# Patient Record
Sex: Male | Born: 1950 | Race: White | Hispanic: No | Smoking: Never smoker
Health system: Southern US, Community
[De-identification: ages and names within clinical notes are randomized; demographics above are authoritative.]

## PROBLEM LIST (undated history)

## (undated) DIAGNOSIS — N189 Chronic kidney disease, unspecified: Secondary | ICD-10-CM

## (undated) DIAGNOSIS — E785 Hyperlipidemia, unspecified: Secondary | ICD-10-CM

## (undated) DIAGNOSIS — H269 Unspecified cataract: Secondary | ICD-10-CM

## (undated) DIAGNOSIS — J8410 Pulmonary fibrosis, unspecified: Secondary | ICD-10-CM

## (undated) DIAGNOSIS — K635 Polyp of colon: Secondary | ICD-10-CM

## (undated) DIAGNOSIS — G4730 Sleep apnea, unspecified: Secondary | ICD-10-CM

## (undated) DIAGNOSIS — I10 Essential (primary) hypertension: Secondary | ICD-10-CM

## (undated) DIAGNOSIS — Z87442 Personal history of urinary calculi: Secondary | ICD-10-CM

## (undated) DIAGNOSIS — I252 Old myocardial infarction: Secondary | ICD-10-CM

## (undated) DIAGNOSIS — I509 Heart failure, unspecified: Secondary | ICD-10-CM

## (undated) DIAGNOSIS — E669 Obesity, unspecified: Secondary | ICD-10-CM

## (undated) DIAGNOSIS — F4321 Adjustment disorder with depressed mood: Secondary | ICD-10-CM

## (undated) DIAGNOSIS — K7460 Unspecified cirrhosis of liver: Secondary | ICD-10-CM

## (undated) DIAGNOSIS — I2510 Atherosclerotic heart disease of native coronary artery without angina pectoris: Secondary | ICD-10-CM

## (undated) HISTORY — DX: Heart failure, unspecified: I50.9

## (undated) HISTORY — DX: Atherosclerotic heart disease of native coronary artery without angina pectoris: I25.10

## (undated) HISTORY — DX: Obesity, unspecified: E66.9

## (undated) HISTORY — DX: Polyp of colon: K63.5

## (undated) HISTORY — PX: POLYPECTOMY: SHX149

## (undated) HISTORY — DX: Old myocardial infarction: I25.2

## (undated) HISTORY — DX: Pulmonary fibrosis, unspecified: J84.10

## (undated) HISTORY — DX: Adjustment disorder with depressed mood: F43.21

## (undated) HISTORY — DX: Personal history of urinary calculi: Z87.442

## (undated) HISTORY — PX: COLONOSCOPY: SHX174

## (undated) HISTORY — DX: Chronic kidney disease, unspecified: N18.9

## (undated) HISTORY — DX: Essential (primary) hypertension: I10

## (undated) HISTORY — DX: Unspecified cataract: H26.9

## (undated) HISTORY — DX: Unspecified cirrhosis of liver: K74.60

## (undated) HISTORY — DX: Hyperlipidemia, unspecified: E78.5

---

## 2004-02-02 DIAGNOSIS — I2510 Atherosclerotic heart disease of native coronary artery without angina pectoris: Secondary | ICD-10-CM

## 2004-02-02 HISTORY — DX: Atherosclerotic heart disease of native coronary artery without angina pectoris: I25.10

## 2004-02-02 HISTORY — PX: CORONARY ARTERY BYPASS GRAFT: SHX141

## 2004-07-07 DIAGNOSIS — I252 Old myocardial infarction: Secondary | ICD-10-CM

## 2004-07-07 HISTORY — DX: Old myocardial infarction: I25.2

## 2004-07-23 ENCOUNTER — Ambulatory Visit: Payer: Self-pay | Admitting: Cardiology

## 2004-07-24 ENCOUNTER — Inpatient Hospital Stay (HOSPITAL_COMMUNITY): Admission: AD | Admit: 2004-07-24 | Discharge: 2004-08-01 | Payer: Self-pay | Admitting: Cardiology

## 2004-07-24 ENCOUNTER — Encounter: Payer: Self-pay | Admitting: Cardiology

## 2004-07-24 ENCOUNTER — Ambulatory Visit: Payer: Self-pay | Admitting: Cardiology

## 2004-08-20 ENCOUNTER — Ambulatory Visit: Payer: Self-pay | Admitting: Cardiology

## 2004-09-24 ENCOUNTER — Ambulatory Visit: Payer: Self-pay | Admitting: Internal Medicine

## 2004-09-29 ENCOUNTER — Ambulatory Visit: Payer: Self-pay | Admitting: Cardiology

## 2004-10-01 ENCOUNTER — Ambulatory Visit: Payer: Self-pay | Admitting: Internal Medicine

## 2004-12-28 ENCOUNTER — Ambulatory Visit: Payer: Self-pay | Admitting: Cardiology

## 2005-02-23 ENCOUNTER — Ambulatory Visit: Payer: Self-pay | Admitting: Cardiology

## 2005-02-26 ENCOUNTER — Ambulatory Visit: Payer: Self-pay | Admitting: Cardiology

## 2006-07-03 ENCOUNTER — Encounter: Payer: Self-pay | Admitting: Internal Medicine

## 2006-07-06 ENCOUNTER — Ambulatory Visit: Payer: Self-pay | Admitting: Internal Medicine

## 2006-07-06 LAB — CONVERTED CEMR LAB
ALT: 27 units/L (ref 0–40)
AST: 31 units/L (ref 0–37)
Albumin: 3.8 g/dL (ref 3.5–5.2)
Alkaline Phosphatase: 59 units/L (ref 39–117)
BUN: 17 mg/dL (ref 6–23)
Basophils Absolute: 0 10*3/uL (ref 0.0–0.1)
Basophils Relative: 0.2 % (ref 0.0–1.0)
Bilirubin, Direct: 0.1 mg/dL (ref 0.0–0.3)
CO2: 26 meq/L (ref 19–32)
Calcium: 9.2 mg/dL (ref 8.4–10.5)
Chloride: 103 meq/L (ref 96–112)
Cholesterol: 103 mg/dL (ref 0–200)
Creatinine, Ser: 1.1 mg/dL (ref 0.4–1.5)
Eosinophils Absolute: 0.2 10*3/uL (ref 0.0–0.6)
Eosinophils Relative: 1.9 % (ref 0.0–5.0)
GFR calc Af Amer: 89 mL/min
GFR calc non Af Amer: 74 mL/min
Glucose, Bld: 103 mg/dL — ABNORMAL HIGH (ref 70–99)
HCT: 43.8 % (ref 39.0–52.0)
HDL: 27.2 mg/dL — ABNORMAL LOW (ref >39.0)
Hemoglobin: 15.6 g/dL (ref 13.0–17.0)
LDL Cholesterol: 58 mg/dL (ref 0–99)
Lymphocytes Relative: 28.1 % (ref 12.0–46.0)
MCHC: 35.7 g/dL (ref 30.0–36.0)
MCV: 89.4 fL (ref 78.0–100.0)
Monocytes Absolute: 0.6 10*3/uL (ref 0.2–0.7)
Monocytes Relative: 7 % (ref 3.0–11.0)
Neutro Abs: 5.2 10*3/uL (ref 1.4–7.7)
Neutrophils Relative %: 62.8 % (ref 43.0–77.0)
PSA: 0.37 ng/mL (ref 0.10–4.00)
Platelets: 398 10*3/uL (ref 150–400)
Potassium: 4.2 meq/L (ref 3.5–5.1)
RBC: 4.9 M/uL (ref 4.22–5.81)
RDW: 12.7 % (ref 11.5–14.6)
Sodium: 138 meq/L (ref 135–145)
TSH: 1.63 microintl units/mL (ref 0.35–5.50)
Total Bilirubin: 0.9 mg/dL (ref 0.3–1.2)
Total CHOL/HDL Ratio: 3.8
Total Protein: 7.5 g/dL (ref 6.0–8.3)
Triglycerides: 88 mg/dL (ref 0–149)
VLDL: 18 mg/dL (ref 0–40)
WBC: 8.4 10*3/uL (ref 4.5–10.5)

## 2006-08-09 ENCOUNTER — Encounter: Payer: Self-pay | Admitting: Internal Medicine

## 2006-08-09 DIAGNOSIS — I10 Essential (primary) hypertension: Secondary | ICD-10-CM

## 2006-08-09 DIAGNOSIS — Z87442 Personal history of urinary calculi: Secondary | ICD-10-CM

## 2006-08-09 DIAGNOSIS — E785 Hyperlipidemia, unspecified: Secondary | ICD-10-CM

## 2006-08-09 DIAGNOSIS — I252 Old myocardial infarction: Secondary | ICD-10-CM | POA: Insufficient documentation

## 2006-08-09 DIAGNOSIS — I2510 Atherosclerotic heart disease of native coronary artery without angina pectoris: Secondary | ICD-10-CM | POA: Insufficient documentation

## 2006-08-09 HISTORY — DX: Hyperlipidemia, unspecified: E78.5

## 2006-08-09 HISTORY — DX: Essential (primary) hypertension: I10

## 2006-08-09 HISTORY — DX: Personal history of urinary calculi: Z87.442

## 2006-09-09 ENCOUNTER — Encounter (INDEPENDENT_AMBULATORY_CARE_PROVIDER_SITE_OTHER): Payer: Self-pay

## 2006-10-10 ENCOUNTER — Ambulatory Visit: Payer: Self-pay | Admitting: Cardiology

## 2006-10-14 ENCOUNTER — Ambulatory Visit: Payer: Self-pay | Admitting: Internal Medicine

## 2006-12-06 ENCOUNTER — Ambulatory Visit: Payer: Self-pay | Admitting: Internal Medicine

## 2006-12-06 DIAGNOSIS — J069 Acute upper respiratory infection, unspecified: Secondary | ICD-10-CM | POA: Insufficient documentation

## 2006-12-09 ENCOUNTER — Telehealth: Payer: Self-pay | Admitting: Internal Medicine

## 2007-07-04 ENCOUNTER — Ambulatory Visit: Payer: Self-pay | Admitting: Internal Medicine

## 2007-07-04 DIAGNOSIS — L03119 Cellulitis of unspecified part of limb: Secondary | ICD-10-CM

## 2007-07-04 DIAGNOSIS — L02419 Cutaneous abscess of limb, unspecified: Secondary | ICD-10-CM | POA: Insufficient documentation

## 2007-09-07 ENCOUNTER — Telehealth: Payer: Self-pay | Admitting: Internal Medicine

## 2007-09-07 ENCOUNTER — Ambulatory Visit: Payer: Self-pay | Admitting: Internal Medicine

## 2007-09-07 DIAGNOSIS — F4321 Adjustment disorder with depressed mood: Secondary | ICD-10-CM | POA: Insufficient documentation

## 2007-09-07 HISTORY — DX: Adjustment disorder with depressed mood: F43.21

## 2007-09-15 ENCOUNTER — Telehealth: Payer: Self-pay | Admitting: Family Medicine

## 2007-10-16 ENCOUNTER — Ambulatory Visit: Payer: Self-pay | Admitting: Internal Medicine

## 2007-12-26 ENCOUNTER — Encounter: Payer: Self-pay | Admitting: Internal Medicine

## 2008-07-08 ENCOUNTER — Ambulatory Visit: Payer: Self-pay | Admitting: Internal Medicine

## 2008-07-08 LAB — CONVERTED CEMR LAB
ALT: 44 units/L (ref 0–53)
AST: 42 units/L — ABNORMAL HIGH (ref 0–37)
Albumin: 3.8 g/dL (ref 3.5–5.2)
Alkaline Phosphatase: 70 units/L (ref 39–117)
BUN: 10 mg/dL (ref 6–23)
Basophils Absolute: 0 10*3/uL (ref 0.0–0.1)
Basophils Relative: 0.3 % (ref 0.0–3.0)
Bilirubin Urine: NEGATIVE
Bilirubin, Direct: 0 mg/dL (ref 0.0–0.3)
CO2: 29 meq/L (ref 19–32)
Calcium: 9.1 mg/dL (ref 8.4–10.5)
Chloride: 108 meq/L (ref 96–112)
Cholesterol: 150 mg/dL (ref 0–200)
Creatinine, Ser: 0.8 mg/dL (ref 0.4–1.5)
Eosinophils Absolute: 0.2 10*3/uL (ref 0.0–0.7)
Eosinophils Relative: 2 % (ref 0.0–5.0)
GFR calc non Af Amer: 105.26 mL/min (ref >60)
Glucose, Bld: 101 mg/dL — ABNORMAL HIGH (ref 70–99)
Glucose, Urine, Semiquant: NEGATIVE
HCT: 43.6 % (ref 39.0–52.0)
HDL: 33.8 mg/dL — ABNORMAL LOW (ref >39.00)
Hemoglobin: 15.3 g/dL (ref 13.0–17.0)
Ketones, urine, test strip: NEGATIVE
LDL Cholesterol: 92 mg/dL (ref 0–99)
Lymphocytes Relative: 25.2 % (ref 12.0–46.0)
Lymphs Abs: 2.2 10*3/uL (ref 0.7–4.0)
MCHC: 35.2 g/dL (ref 30.0–36.0)
MCV: 91.2 fL (ref 78.0–100.0)
Monocytes Absolute: 0.5 10*3/uL (ref 0.1–1.0)
Monocytes Relative: 6.2 % (ref 3.0–12.0)
Neutro Abs: 5.7 10*3/uL (ref 1.4–7.7)
Neutrophils Relative %: 66.3 % (ref 43.0–77.0)
Nitrite: NEGATIVE
PSA: 0.29 ng/mL (ref 0.10–4.00)
Platelets: 260 10*3/uL (ref 150.0–400.0)
Potassium: 4.2 meq/L (ref 3.5–5.1)
Protein, U semiquant: NEGATIVE
RBC: 4.77 M/uL (ref 4.22–5.81)
RDW: 13.2 % (ref 11.5–14.6)
Sodium: 141 meq/L (ref 135–145)
Specific Gravity, Urine: 1.025
TSH: 2.41 microintl units/mL (ref 0.35–5.50)
Total Bilirubin: 1 mg/dL (ref 0.3–1.2)
Total CHOL/HDL Ratio: 4
Total Protein: 7.3 g/dL (ref 6.0–8.3)
Triglycerides: 119 mg/dL (ref 0.0–149.0)
Urobilinogen, UA: 0.2
VLDL: 23.8 mg/dL (ref 0.0–40.0)
WBC Urine, dipstick: NEGATIVE
WBC: 8.6 10*3/uL (ref 4.5–10.5)
pH: 5.5

## 2008-07-15 ENCOUNTER — Ambulatory Visit: Payer: Self-pay | Admitting: Internal Medicine

## 2008-07-15 DIAGNOSIS — R635 Abnormal weight gain: Secondary | ICD-10-CM | POA: Insufficient documentation

## 2008-07-19 DIAGNOSIS — E669 Obesity, unspecified: Secondary | ICD-10-CM

## 2008-07-19 HISTORY — DX: Obesity, unspecified: E66.9

## 2008-07-23 ENCOUNTER — Ambulatory Visit: Payer: Self-pay | Admitting: Cardiology

## 2008-08-01 ENCOUNTER — Ambulatory Visit: Payer: Self-pay | Admitting: Cardiology

## 2008-08-06 LAB — CONVERTED CEMR LAB
BUN: 14 mg/dL (ref 6–23)
CO2: 29 meq/L (ref 19–32)
Calcium: 9.2 mg/dL (ref 8.4–10.5)
Chloride: 103 meq/L (ref 96–112)
Creatinine, Ser: 0.7 mg/dL (ref 0.4–1.5)
GFR calc non Af Amer: 122.77 mL/min (ref >60)
Glucose, Bld: 96 mg/dL (ref 70–99)
Potassium: 4.4 meq/L (ref 3.5–5.1)
Sodium: 138 meq/L (ref 135–145)

## 2008-10-14 ENCOUNTER — Ambulatory Visit: Payer: Self-pay | Admitting: Gastroenterology

## 2008-10-28 ENCOUNTER — Ambulatory Visit: Payer: Self-pay | Admitting: Gastroenterology

## 2008-10-28 ENCOUNTER — Encounter: Payer: Self-pay | Admitting: Gastroenterology

## 2008-10-28 LAB — HM COLONOSCOPY

## 2008-10-30 ENCOUNTER — Encounter: Payer: Self-pay | Admitting: Gastroenterology

## 2008-11-19 ENCOUNTER — Telehealth: Payer: Self-pay | Admitting: Cardiology

## 2008-12-03 ENCOUNTER — Ambulatory Visit: Payer: Self-pay | Admitting: Internal Medicine

## 2008-12-03 LAB — CONVERTED CEMR LAB: Pro B Natriuretic peptide (BNP): 36 pg/mL (ref 0.0–100.0)

## 2008-12-09 ENCOUNTER — Ambulatory Visit: Payer: Self-pay | Admitting: Cardiology

## 2008-12-09 DIAGNOSIS — R0602 Shortness of breath: Secondary | ICD-10-CM | POA: Insufficient documentation

## 2008-12-09 DIAGNOSIS — I509 Heart failure, unspecified: Secondary | ICD-10-CM

## 2008-12-09 DIAGNOSIS — I5042 Chronic combined systolic (congestive) and diastolic (congestive) heart failure: Secondary | ICD-10-CM | POA: Insufficient documentation

## 2008-12-09 HISTORY — DX: Heart failure, unspecified: I50.9

## 2008-12-23 ENCOUNTER — Ambulatory Visit (HOSPITAL_COMMUNITY): Admission: RE | Admit: 2008-12-23 | Discharge: 2008-12-23 | Payer: Self-pay | Admitting: Cardiology

## 2008-12-23 ENCOUNTER — Ambulatory Visit: Payer: Self-pay

## 2008-12-23 ENCOUNTER — Ambulatory Visit: Payer: Self-pay | Admitting: Cardiovascular Disease

## 2008-12-23 ENCOUNTER — Encounter: Payer: Self-pay | Admitting: Cardiology

## 2009-04-01 ENCOUNTER — Ambulatory Visit: Payer: Self-pay | Admitting: Internal Medicine

## 2009-04-04 ENCOUNTER — Telehealth: Payer: Self-pay | Admitting: Internal Medicine

## 2009-06-25 ENCOUNTER — Encounter (INDEPENDENT_AMBULATORY_CARE_PROVIDER_SITE_OTHER): Payer: Self-pay | Admitting: *Deleted

## 2009-07-11 ENCOUNTER — Ambulatory Visit: Payer: Self-pay | Admitting: Internal Medicine

## 2009-07-11 LAB — CONVERTED CEMR LAB
ALT: 52 units/L (ref 0–53)
AST: 58 units/L — ABNORMAL HIGH (ref 0–37)
Albumin: 3.9 g/dL (ref 3.5–5.2)
Alkaline Phosphatase: 104 units/L (ref 39–117)
BUN: 11 mg/dL (ref 6–23)
Basophils Absolute: 0 10*3/uL (ref 0.0–0.1)
Basophils Relative: 0.2 % (ref 0.0–3.0)
Bilirubin Urine: NEGATIVE
Bilirubin, Direct: 0.2 mg/dL (ref 0.0–0.3)
Blood in Urine, dipstick: NEGATIVE
CO2: 31 meq/L (ref 19–32)
Calcium: 9.5 mg/dL (ref 8.4–10.5)
Chloride: 102 meq/L (ref 96–112)
Cholesterol: 265 mg/dL — ABNORMAL HIGH (ref 0–200)
Creatinine, Ser: 0.8 mg/dL (ref 0.4–1.5)
Direct LDL: 198.2 mg/dL
Eosinophils Absolute: 0.1 10*3/uL (ref 0.0–0.7)
Eosinophils Relative: 1.5 % (ref 0.0–5.0)
GFR calc non Af Amer: 103.41 mL/min (ref >60)
Glucose, Bld: 126 mg/dL — ABNORMAL HIGH (ref 70–99)
Glucose, Urine, Semiquant: NEGATIVE
HCT: 44.5 % (ref 39.0–52.0)
HDL: 39.7 mg/dL (ref >39.00)
Hemoglobin: 15.1 g/dL (ref 13.0–17.0)
Ketones, urine, test strip: NEGATIVE
Lymphocytes Relative: 18.8 % (ref 12.0–46.0)
Lymphs Abs: 1.7 10*3/uL (ref 0.7–4.0)
MCHC: 34 g/dL (ref 30.0–36.0)
MCV: 93.7 fL (ref 78.0–100.0)
Monocytes Absolute: 0.5 10*3/uL (ref 0.1–1.0)
Monocytes Relative: 5.9 % (ref 3.0–12.0)
Neutro Abs: 6.6 10*3/uL (ref 1.4–7.7)
Neutrophils Relative %: 73.6 % (ref 43.0–77.0)
Nitrite: NEGATIVE
PSA: 0.38 ng/mL (ref 0.10–4.00)
Platelets: 188 10*3/uL (ref 150.0–400.0)
Potassium: 4.9 meq/L (ref 3.5–5.1)
RBC: 4.75 M/uL (ref 4.22–5.81)
RDW: 14.7 % — ABNORMAL HIGH (ref 11.5–14.6)
Sodium: 142 meq/L (ref 135–145)
Specific Gravity, Urine: 1.02
TSH: 1.64 microintl units/mL (ref 0.35–5.50)
Total Bilirubin: 1 mg/dL (ref 0.3–1.2)
Total CHOL/HDL Ratio: 7
Total Protein: 7.7 g/dL (ref 6.0–8.3)
Triglycerides: 168 mg/dL — ABNORMAL HIGH (ref 0.0–149.0)
Urobilinogen, UA: 1
VLDL: 33.6 mg/dL (ref 0.0–40.0)
WBC Urine, dipstick: NEGATIVE
WBC: 8.9 10*3/uL (ref 4.5–10.5)
pH: 7.5

## 2009-07-28 ENCOUNTER — Ambulatory Visit: Payer: Self-pay | Admitting: Internal Medicine

## 2009-08-11 ENCOUNTER — Encounter (INDEPENDENT_AMBULATORY_CARE_PROVIDER_SITE_OTHER): Payer: Self-pay | Admitting: *Deleted

## 2009-08-11 ENCOUNTER — Ambulatory Visit: Payer: Self-pay | Admitting: Cardiology

## 2010-01-19 ENCOUNTER — Ambulatory Visit: Payer: Self-pay | Admitting: Internal Medicine

## 2010-03-01 LAB — CONVERTED CEMR LAB
BUN: 10 mg/dL (ref 6–23)
CO2: 26 meq/L (ref 19–32)
Calcium: 9.4 mg/dL (ref 8.4–10.5)
Chloride: 106 meq/L (ref 96–112)
Creatinine, Ser: 0.8 mg/dL (ref 0.4–1.5)
GFR calc non Af Amer: 105.11 mL/min (ref >60)
Glucose, Bld: 128 mg/dL — ABNORMAL HIGH (ref 70–99)
Potassium: 4 meq/L (ref 3.5–5.1)
Sodium: 141 meq/L (ref 135–145)

## 2010-03-03 NOTE — Miscellaneous (Signed)
  Medications Added ASPIRIN 325 MG TABS (ASPIRIN) Take 1 tablet by mouth once a day COZAAR 50 MG TABS (LOSARTAN POTASSIUM) Take 1 tablet by mouth once a day LIPITOR 80 MG TABS (ATORVASTATIN CALCIUM) Take 1 tablet by mouth once a day TOPROL XL 50 MG TB24 (METOPROLOL SUCCINATE) 1 tablet by mouth once a day      Allergies Added: NKDA Clinical Lists Changes  Medications: Added new medication of ASPIRIN 325 MG TABS (ASPIRIN) Take 1 tablet by mouth once a day Added new medication of COZAAR 50 MG TABS (LOSARTAN POTASSIUM) Take 1 tablet by mouth once a day Added new medication of LIPITOR 80 MG TABS (ATORVASTATIN CALCIUM) Take 1 tablet by mouth once a day Added new medication of TOPROL XL 50 MG TB24 (METOPROLOL SUCCINATE) 1 tablet by mouth once a day Observations: Added new observation of LLIMPORTALLS: completed (09/09/2006 12:33) Added new observation of NKA: T (09/09/2006 12:33) Added new observation of LLIMPORTMEDS: completed (09/09/2006 12:33)

## 2010-03-03 NOTE — Assessment & Plan Note (Signed)
Summary: 3 month fup//ccm/pt rsc/cjr   Vital Signs:  Patient profile:   60 year old male Weight:      250 pounds Temp:     98.7 degrees F oral BP sitting:   134 / 80  (left arm) Cuff size:   regular  Vitals Entered By: Duard Brady LPN (April 01, 1608 3:49 PM) CC: 3 mos rov - doing well  Is Patient Diabetic? No   CC:  3 mos rov - doing well .  History of Present Illness: 60 year old patient who is seen today for follow-up of his coronary artery disease.  He is status post CABG in June of 2006.  His followed closely by cardiology.  He has a history congestive heart failure.  In November he echocardiogram was performed and revealed normal LV function.  He has treated hypertension and dyslipidemia.  The past few months.  He has lost 12 pounds in weight.  He denies any exertional chest pain or dyspnea on exertion.  In general, he feels quite well  Preventive Screening-Counseling & Management  Alcohol-Tobacco     Smoking Status: never  Allergies (verified): No Known Drug Allergies  Past History:  Past Medical History: Reviewed history from 12/09/2008 and no changes required. Current Problems:  HYPERLIPIDEMIA (ICD-272.4) CORONARY ARTERY DISEASE (ICD-414.00) MYOCARDIAL INFARCTION, HX OF (ICD-412) HYPERTENSION (ICD-401.9) ADJUSTMENT DISORDER WITH DEPRESSED MOOD (ICD-309.0) CELLULITIS, RIGHT LEG (ICD-682.6) NEPHROLITHIASIS, HX OF (ICD-V13.01)  Past Surgical History: Coronary artery bypass graft June 2006 colonoscopy September 2010  Family History: Reviewed history from 10/14/2006 and no changes required. father died age 61, history of heart failure, peptic ulcer disease mother is 61 doing quite well as in history crowbars A. status post PCI hypertension, dyslipidemia two brothers, one with hypertension one died a suicide death  Social History: Reviewed history from 07/23/2008 and no changes required. has a girlfriend of 20 years duration Regular  exercise-no Tobacco Use - No.  Alcohol Use - no  Review of Systems       The patient complains of weight loss.  The patient denies anorexia, fever, weight gain, vision loss, decreased hearing, hoarseness, chest pain, syncope, dyspnea on exertion, peripheral edema, prolonged cough, headaches, hemoptysis, abdominal pain, melena, hematochezia, severe indigestion/heartburn, hematuria, incontinence, genital sores, muscle weakness, suspicious skin lesions, transient blindness, difficulty walking, depression, unusual weight change, abnormal bleeding, enlarged lymph nodes, angioedema, breast masses, and testicular masses.    Physical Exam  General:  overweight-appearing.  130/80 Head:  Normocephalic and atraumatic without obvious abnormalities. No apparent alopecia or balding. Eyes:  No corneal or conjunctival inflammation noted. EOMI. Perrla. Funduscopic exam benign, without hemorrhages, exudates or papilledema. Vision grossly normal. Ears:  External ear exam shows no significant lesions or deformities.  Otoscopic examination reveals clear canals, tympanic membranes are intact bilaterally without bulging, retraction, inflammation or discharge. Hearing is grossly normal bilaterally. Mouth:  Oral mucosa and oropharynx without lesions or exudates.  Teeth in good repair. Neck:  No deformities, masses, or tenderness noted. Chest Wall:  sternotomy scar Lungs:  Normal respiratory effort, chest expands symmetrically. Lungs are clear to auscultation, no crackles or wheezes. Heart:  Normal rate and regular rhythm. S1 and S2 normal without gallop, murmur, click, rub or other extra sounds. Abdomen:  Bowel sounds positive,abdomen soft and non-tender without masses, organomegaly or hernias noted. Msk:  No deformity or scoliosis noted of thoracic or lumbar spine.   Pulses:  intact pedal pulses Extremities:  no edema   Impression & Recommendations:  Problem # 1:  CORONARY ARTERY DISEASE (ICD-414.00)  His  updated medication list for this problem includes:    Aspirin 325 Mg Tabs (Aspirin) .Marland Kitchen... Take 1 tablet by mouth once a day    Cozaar 100 Mg Tabs (Losartan potassium) .Marland Kitchen... Take one tablet by mouth daily    Toprol Xl 50 Mg Tb24 (Metoprolol succinate) .Marland Kitchen... 1 tablet by mouth once a day    Furosemide 40 Mg Tabs (Furosemide) ..... One every morning  His updated medication list for this problem includes:    Aspirin 325 Mg Tabs (Aspirin) .Marland Kitchen... Take 1 tablet by mouth once a day    Cozaar 100 Mg Tabs (Losartan potassium) .Marland Kitchen... Take one tablet by mouth daily    Toprol Xl 50 Mg Tb24 (Metoprolol succinate) .Marland Kitchen... 1 tablet by mouth once a day    Furosemide 40 Mg Tabs (Furosemide) ..... One every morning  Problem # 2:  HYPERLIPIDEMIA (ICD-272.4)  His updated medication list for this problem includes:    Lipitor 80 Mg Tabs (Atorvastatin calcium) .Marland Kitchen... Take 1 tablet by mouth once a day    Zetia 10 Mg Tabs (Ezetimibe) .Marland Kitchen... 1 once daily  His updated medication list for this problem includes:    Lipitor 80 Mg Tabs (Atorvastatin calcium) .Marland Kitchen... Take 1 tablet by mouth once a day    Zetia 10 Mg Tabs (Ezetimibe) .Marland Kitchen... 1 once daily  Problem # 3:  HYPERTENSION (ICD-401.9)  His updated medication list for this problem includes:    Cozaar 100 Mg Tabs (Losartan potassium) .Marland Kitchen... Take one tablet by mouth daily    Toprol Xl 50 Mg Tb24 (Metoprolol succinate) .Marland Kitchen... 1 tablet by mouth once a day    Furosemide 40 Mg Tabs (Furosemide) ..... One every morning  His updated medication list for this problem includes:    Cozaar 100 Mg Tabs (Losartan potassium) .Marland Kitchen... Take one tablet by mouth daily    Toprol Xl 50 Mg Tb24 (Metoprolol succinate) .Marland Kitchen... 1 tablet by mouth once a day    Furosemide 40 Mg Tabs (Furosemide) ..... One every morning  Complete Medication List: 1)  Aspirin 325 Mg Tabs (Aspirin) .... Take 1 tablet by mouth once a day 2)  Cozaar 100 Mg Tabs (Losartan potassium) .... Take one tablet by mouth daily 3)   Lipitor 80 Mg Tabs (Atorvastatin calcium) .... Take 1 tablet by mouth once a day 4)  Toprol Xl 50 Mg Tb24 (Metoprolol succinate) .Marland Kitchen.. 1 tablet by mouth once a day 5)  Zetia 10 Mg Tabs (Ezetimibe) .Marland Kitchen.. 1 once daily 6)  Lorazepam 0.5 Mg Tabs (Lorazepam) .... One twice daily as needed for anxiety or sleep 7)  Furosemide 40 Mg Tabs (Furosemide) .... One every morning 8)  Aleve 220 Mg Tabs (Naproxen sodium) .... As needed  Patient Instructions: 1)  Please schedule a follow-up appointment in 4 months for CPX  2)  Limit your Sodium (Salt). 3)  It is important that you exercise regularly at least 20 minutes 5 times a week. If you develop chest pain, have severe difficulty breathing, or feel very tired , stop exercising immediately and seek medical attention. 4)  You need to lose weight. Consider a lower calorie diet and regular exercise.

## 2010-03-03 NOTE — Letter (Signed)
Summary: Patient Notice- Polyp Results  [facility]  [address] [place], [place] [address]   Phone: [phone]  Fax: [phone]        October 30, 2008 MRN: [id number]    [facility] [address] [place], [place]  [address]    Dear Mr. [facility],  I am pleased to inform you that the colon polyp(s) removed during your recent colonoscopy was (were) found to be benign (no cancer detected) upon pathologic examination.  I recommend you have a repeat colonoscopy examination in 5_ years to look for recurrent polyps, as having colon polyps increases your risk for having recurrent polyps or even colon cancer in the future.  Should you develop new or worsening symptoms of abdominal pain, bowel habit changes or bleeding from the rectum or bowels, please schedule an evaluation with either your primary care physician or with me.  Additional information/recommendations:  __ No further action with gastroenterology is needed at this time. Please      follow-up with your primary care physician for your other healthcare      needs.  __ Please call [phone] to schedule a return visit to review your      situation.  __ Please keep your follow-up visit as already scheduled.  _x_ Continue treatment plan as outlined the day of your exam.  Please call us if you are having persistent problems or have questions about your condition that have not been fully answered at this time.  Sincerely,  Louis Meckel MD  This letter has been electronically signed by your physician.

## 2010-03-03 NOTE — Letter (Signed)
Summary: *Medication Reconciliation Letter  All     ,     Phone:   Fax:      08/11/2009 MRN: [id number]  [facility] [address] [place], [place]  [address]  Dear Mr. [facility],  We are glad to see you again today!  Please take a moment to review and update your medication list. You can give this paper to the nurse when she calls you back for your appointment. Please include the over the counter medicines that you are currently taking.  Medication List:  ASPIRIN 325 MG TABS (ASPIRIN) Take 1 tablet by mouth once a day COZAAR 100 MG TABS (LOSARTAN POTASSIUM) Take one tablet by mouth daily LIPITOR 80 MG TABS (ATORVASTATIN CALCIUM) Take 1 tablet by mouth once a day TOPROL XL 50 MG TB24 (METOPROLOL SUCCINATE) 1 tablet by mouth once a day ZETIA 10 MG  TABS (EZETIMIBE) 1 once daily LORAZEPAM 0.5 MG  TABS (LORAZEPAM) one twice daily as needed for anxiety or sleep FUROSEMIDE 40 MG TABS (FUROSEMIDE) one every morning ALEVE 220 MG TABS (NAPROXEN SODIUM) as needed    Medication allergies:______________________________________________  Name of your current Pharmacy and location:_____________________________  ____________________________________________    Thank you

## 2010-03-03 NOTE — Letter (Signed)
Summary: [facility] Discharge Summary  [facility] Discharge Summary   Imported By: Kassie Mends 09/06/2006 14:05:55  _____________________________________________________________________  External Attachment:    Type:   Image     Comment:   [facility] d/c paper

## 2010-03-03 NOTE — Assessment & Plan Note (Signed)
Summary: 3 MONTH ROV/SL    History of Present Illness: [name] is a pleasant gentleman who has a history of coronary artery disease, status post coronary artery bypass graft surgery.  This was performed in June 2008. Echocardiogram in November of 2010 revealed an ejection fraction of 50-55%, moderate left atrial enlargement, and mild right atrial enlargement.  I last saw him in November of 2010. Since then the patient denies any dyspnea on exertion, orthopnea, PND, pedal edema, palpitations, syncope or chest pain.   Current Medications (verified): 1)  Aspirin 325 Mg Tabs (Aspirin) .... Take 1 Tablet By Mouth Once A Day 2)  Cozaar 100 Mg Tabs (Losartan Potassium) .... Take One Tablet By Mouth Daily 3)  Lipitor 80 Mg Tabs (Atorvastatin Calcium) .... Take 1 Tablet By Mouth Once A Day 4)  Toprol Xl 50 Mg Tb24 (Metoprolol Succinate) .Marland Kitchen.. 1 Tablet By Mouth Once A Day 5)  Zetia 10 Mg  Tabs (Ezetimibe) .Marland Kitchen.. 1 Once Daily 6)  Aleve 220 Mg Tabs (Naproxen Sodium) .... 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 Coronary artery bypass graft June 2006 colonoscopy September 2010  Social History: Reviewed history from 07/23/2008 and no changes required. has a girlfriend of 20 years duration Regular exercise-no Tobacco Use - No.  Alcohol Use - no  Review of Systems       no fevers or chills, productive cough, hemoptysis, dysphasia, odynophagia, melena, hematochezia, dysuria, hematuria, rash, seizure activity, orthopnea, PND, pedal edema, claudication. Remaining systems are negative.   Vital Signs:  Patient profile:   60 year old male Height:       64 inches Weight:      244 pounds BMI:     34.28 Pulse rate:   70 / minute Resp:     12 per minute BP sitting:   102 / 65  (left arm)  Vitals Entered By: Kem Parkinson (August 11, 2009 2:25 PM)  Physical Exam  General:  Well-developed obese in no acute distress.  Skin is warm and dry.  HEENT is normal.  Neck is supple. No thyromegaly.  Chest is clear to auscultation with normal expansion.  Cardiovascular exam is regular rate and rhythm.  Abdominal exam nontender or distended. No masses palpated. Extremities show no edema. neuro grossly intact    Impression & Recommendations:  Problem # 1:  HYPERLIPIDEMIA (ICD-272.4) Patient discontinued Lipitor previously and his LDL was close to 200. He resumed this and we will plan to check lipids and liver in 4 weeks. His updated medication list for this problem includes:    Lipitor 80 Mg Tabs (Atorvastatin calcium) .Marland Kitchen... Take 1 tablet by mouth once a day    Zetia 10 Mg Tabs (Ezetimibe) .Marland Kitchen... 1 once daily  Problem # 2:  CORONARY ARTERY DISEASE (ICD-414.00) Continue aspirin, beta blocker and statin. Continue diet and exercise. Patient does not smoke. His updated medication list for this problem includes:    Aspirin 325 Mg Tabs (Aspirin) .Marland Kitchen... Take 1 tablet by mouth once a day    Toprol Xl 50 Mg Tb24 (Metoprolol succinate) .Marland Kitchen... 1 tablet by mouth once a day  Problem # 3:  HYPERTENSION (ICD-401.9) Blood pressure control on present medications. Will continue. Renal function and potassium monitored by primary care. The  following medications were removed from the medication list:    Furosemide 40 Mg Tabs (Furosemide) ..... One every morning His updated medication list for this problem includes:    Aspirin 325 Mg Tabs (Aspirin) .Marland Kitchen... Take 1 tablet by mouth once a day    Cozaar 100 Mg Tabs (Losartan potassium) .Marland Kitchen... Take one tablet by mouth daily    Toprol Xl 50 Mg Tb24 (Metoprolol succinate) .Marland Kitchen... 1 tablet by mouth once a day  Problem # 4:   OBESITY (ICD-278.00) Continue diet.  Patient Instructions: 1)  Your physician recommends that you schedule a follow-up appointment in: ONE YEAR 2)  Your physician recommends that you return for a FASTING lipid profile: ONE MONTH

## 2010-03-03 NOTE — Progress Notes (Signed)
Summary: refill issue   Phone Note Refill Request Call back at Work Phone [phone] Message from:  Patient  cozaar prescription issues.  patient needs 100 mg not 50 mg [facility]  [phone]   Method Requested: Telephone to Pharmacy Initial call taken by: Burnard Leigh,  November 19, 2008 12:46 PM  Follow-up for Phone Call        called pt back no answer sent cozaar to pharmacy as 100mg  dr Jens Som increased medication the cozaar 50mg  was sent by another office Follow-up by: Kem Parkinson,  November 19, 2008 1:45 PM    Prescriptions: COZAAR 100 MG TABS (LOSARTAN POTASSIUM) Take one tablet by mouth daily  #30 x 12   Entered by:   Kem Parkinson   Authorized by:   Ferman Hamming, MD, [facility]   Signed by:   Kem Parkinson on 11/19/2008   Method used:   [facility] [address]       [address]       [place], [place]  [address]       Ph: [phone] or [phone]       Fax: [phone]   RxID:   [id number]

## 2010-03-03 NOTE — Progress Notes (Signed)
Summary: Sx worse since 11/4 OV  Phone Note Call from Patient Call back at Home Phone [phone] Call back at Work Phone [phone]   Caller: Patient Call For: Kwiat Summary of Call: Call work number above back if before 5pm. Pt called to report he was in to see Dr Winfred Burn on 11/4 for cough, cold sx.  He report he was given 4 small sample bottles of a liquid and samples of a pill, pt does not have the name of these samples and I am unable to see on OV note.  Pt reports his sx have only become worse, congestion is worse, terrible dry hacky cough keeping him awake at HS and he states he now has a fever (unable to tell me what his temp was, he just reports sx of hot/cold chills).  Pt requesting another RX. [facility]   Follow-up for Phone Call        Doxycycline 100  #20 one twice daily  Generic Tessalon perles  #20 one three times daily  Additional Follow-up for Phone Call Additional follow up Details #1::        Rx called in, pt informed on personal VM. Additional Follow-up by: Sid Falcon LPN,  December 09, 2006 2:12 PM    New/Updated Medications: DOXYCYCLINE MONOHYDRATE 100 MG  CAPS (DOXYCYCLINE MONOHYDRATE) one tab by mouth two times a day TESSALON PERLES 100 MG  CAPS (BENZONATATE) one tab by mouth three times a day   Prescriptions: TESSALON PERLES 100 MG  CAPS (BENZONATATE) one tab by mouth three times a day  #20 x 0   Entered by:   Sid Falcon LPN   Authorized by:   Gordy Savers  MD   Signed by:   Sid Falcon LPN on 34/74/2595   Method used:   Electronically sent to ...       [facility]       [address]       [place], [place]  [address]       Ph: [phone] or [phone]       Fax: [phone]   RxID:   [id number] DOXYCYCLINE MONOHYDRATE 100 MG  CAPS (DOXYCYCLINE MONOHYDRATE) one tab by mouth two times a day  #20 x 0   Entered by:   Sid Falcon LPN   Authorized by:   Gordy Savers  MD   Signed  by:   Sid Falcon LPN on 83/15/1761   Method used:   Electronically sent to ...       [facility]       [address]       [place], [place]  [address]       Ph: [phone] or [phone]       Fax: [phone]   RxID:   [id number]

## 2010-03-03 NOTE — Assessment & Plan Note (Signed)
Summary: rov per pt call/lg  Medications Added COZAAR 100 MG TABS (LOSARTAN POTASSIUM) Take one tablet by mouth daily        CC:  follow up.  History of Present Illness: [name] is a pleasant gentleman who has a history of coronary artery disease, status post coronary artery bypass graft surgery.  This was performed in June 2008. I last saw him in September of 2008. Since then he denies any dyspnea on exertion, orthopnea, PND, pedal edema, palpitations, syncope or chest pain. He has gained weight by his report.  Current Medications (verified): 1)  Aspirin 325 Mg Tabs (Aspirin) .... Take 1 Tablet By Mouth Once A Day 2)  Cozaar 50 Mg Tabs (Losartan Potassium) .... Take 1 Tablet By Mouth Once A Day 3)  Lipitor 80 Mg Tabs (Atorvastatin Calcium) .... Take 1 Tablet By Mouth Once A Day 4)  Toprol Xl 50 Mg Tb24 (Metoprolol Succinate) .Marland Kitchen.. 1 Tablet By Mouth Once A Day 5)  Zetia 10 Mg  Tabs (Ezetimibe) .Marland Kitchen.. 1 Once Daily 6)  Lorazepam 0.5 Mg  Tabs (Lorazepam) .... One Twice Daily As Needed For Anxiety or Sleep 7)  Lexapro 20 Mg Tabs (Escitalopram Oxalate) .Marland Kitchen.. 1 Once Daily  Allergies: No Known Drug Allergies  Past History:  Past Medical History: Reviewed history from 07/19/2008 and no changes required. Current Problems:  HYPERLIPIDEMIA (ICD-272.4) CORONARY ARTERY DISEASE (ICD-414.00) MYOCARDIAL INFARCTION, HX OF (ICD-412) HYPERTENSION (ICD-401.9) ADJUSTMENT DISORDER WITH DEPRESSED MOOD (ICD-309.0) CELLULITIS, RIGHT LEG (ICD-682.6) UPPER RESPIRATORY INFECTION, VIRAL (ICD-465.9) EXAMINATION, ROUTINE MEDICAL (ICD-V70.0) NEPHROLITHIASIS, HX OF (ICD-V13.01) OBESITY (ICD-278.00)exogenous WEIGHT GAIN (ICD-783.1)  Past Surgical History: Reviewed history from 07/15/2008 and no changes required. Coronary artery bypass graft June 2008  Social History: has a girlfriend of 20 years duration Regular exercise-no Tobacco Use - No.  Alcohol Use - no  Review of Systems       no fevers  or chills, productive cough, hemoptysis, dysphasia, odynophagia, melena, hematochezia, dysuria, hematuria, rash, seizure activity, orthopnea, PND, pedal edema, claudication. Remaining systems are negative.   Vital Signs:  Patient profile:   60 year old male Height:      66 inches Weight:      260 pounds BMI:     42.12 Pulse rate:   77 / minute Resp:     12 per minute BP sitting:   147 / 88  (right arm)  Vitals Entered By: [name] (July 23, 2008 3:41 PM)  Physical Exam  General:  well-developed obese in no acute distress. Skin is warm and dry. Head:  HEENT is normal. Neck:  supple with no bruits. No thyromegaly noted. Lungs:  clear to auscultation Heart:  regular rate rhythm Abdomen:  soft nontender. No masses palpated. Extremities:  no edema. Neurologic:  grossly intact.   EKG  Procedure date:  07/23/2008  Findings:      normal sinus rhythm at a rate of 76. Left axis deviation. Cannot rule out prior septal infarct. Left hypertrophy.  Impression & Recommendations:  Problem # 1:  CORONARY ARTERY DISEASE (ICD-414.00) No recent chest pain. Continue aspirin, beta blocker and statin. His updated medication list for this problem includes:    Aspirin 325 Mg Tabs (Aspirin) .Marland Kitchen... Take 1 tablet by mouth once a day    Toprol Xl 50 Mg Tb24 (Metoprolol succinate) .Marland Kitchen... 1 tablet by mouth once a day  Problem # 2:  HYPERLIPIDEMIA (ICD-272.4) Continue statin. his primary care is on his lipids and liver. His updated medication list for this  problem includes:    Lipitor 80 Mg Tabs (Atorvastatin calcium) .Marland Kitchen... Take 1 tablet by mouth once a day    Zetia 10 Mg Tabs (Ezetimibe) .Marland Kitchen... 1 once daily  Problem # 3:  HYPERTENSION (ICD-401.9) Blood pressure is elevated. Increase Cozaar to 100 mg p.o. daily. Check bmet in one week. His updated medication list for this problem includes:    Aspirin 325 Mg Tabs (Aspirin) .Marland Kitchen... Take 1 tablet by mouth once a day    Cozaar 100 Mg Tabs  (Losartan potassium) .Marland Kitchen... Take one tablet by mouth daily    Toprol Xl 50 Mg Tb24 (Metoprolol succinate) .Marland Kitchen... 1 tablet by mouth once a day  Patient Instructions: 1)  Your physician recommends that you schedule a follow-up appointment in: ONE YEAR 2)  Your physician recommends that you return for lab work in:ONE WEEK-BMP-401.1/V58.69 3)  Your physician has recommended you make the following change in your medication: INCREASE COZAAR TO 100MG  ONE TABLET DAILY Prescriptions: COZAAR 100 MG TABS (LOSARTAN POTASSIUM) Take one tablet by mouth daily  #30 x 12   Entered by:   Tony Goody, RN   Authorized by:   Ferman Hamming, MD, [facility]   Signed by:   Tony Goody, RN on 07/23/2008   Method used:   [facility]        [facility] [address] [facility]       [address]       [place], [place]  [address]       Ph: [phone] or [phone]       Fax: [phone]   RxID:   [id number]

## 2010-03-03 NOTE — Letter (Signed)
Summary: Appointment - Reschedule  [facility]     [place], [place]    Phone:   Fax:      Jun 25, 2009 MRN: [id number]   [facility] [address] [place], [place]  [address]   Dear Mr. [facility],   Due to a change in our office schedule, your appointment on  07-30-2009 at   12:30             must be changed.  It is very important that we reach you to reschedule this appointment. We look forward to participating in your health care needs. Please contact us at the number listed above at your earliest convenience to reschedule this appointment.     Sincerely,     Lorne Skeens  [facility] Scheduling Team

## 2010-03-03 NOTE — Progress Notes (Signed)
Summary: advice for cold,flu symptoms  Phone Note Call from Patient Call back at [phone]   Summary of Call: Has a cold, the flu, something.  Head congestion.  Feels hot, ? temp, is at work.  With weekend coming, wanted your advice.  [facility] [place]  NKDA. Initial call taken by: Rudy Jew, RN,  April 04, 2009 12:14 PM  Follow-up for Phone Call        Tylenol and advil for pain and fever control; Mucinex DM for cough; if desired can call in generic vicodin 5-500 for pain, fever caugh control  (#50) Follow-up by: Gordy Savers  MD,  April 04, 2009 12:57 PM  Additional Follow-up for Phone Call Additional follow up Details #1::        Phone Call Completed Additional Follow-up by: Rudy Jew, RN,  April 04, 2009 2:54 PM    New/Updated Medications: VICODIN 5-500 MG TABS (HYDROCODONE-ACETAMINOPHEN) One as needed daily pain, fever, cough control. Prescriptions: VICODIN 5-500 MG TABS (HYDROCODONE-ACETAMINOPHEN) One as needed daily pain, fever, cough control.  #50 x 0   Entered by:   Rudy Jew, RN   Authorized by:   Gordy Savers  MD   Signed by:   Rudy Jew, RN on 04/04/2009   Method used:   Telephoned to ...       [facility]  [place] [address] (retail)       [address]       [place], [place]  [address]       Ph: [phone] or [phone]       Fax: [phone]   RxID:   [id number]

## 2010-03-03 NOTE — Progress Notes (Signed)
Summary: needs to be worked in with Dr Kirtland Bouchard today   Phone Note Call from Patient Call back at Work Phone [phone]   Caller: pt live Call For: K  Summary of Call: Needs to be worked in with Dr Kirtland Bouchard today or tomorrow.  Depression.  He lives in [place] and is in [place] today if you could work him in today.   Initial call taken by: Roselle Locus,  September 07, 2007 9:33 AM  Follow-up for Phone Call        Pt. scheduled work in appt today. Follow-up by: Lynann Beaver CMA,  September 07, 2007 10:17 AM

## 2010-03-03 NOTE — Progress Notes (Signed)
Summary: RF  Phone Note Call from Patient Call back at Work Phone [phone]   Caller: Patient-LIVE CALL Call For: DR K Summary of Call: PLEASE RF [facility]AM [facility] [place]. HE WILL BE OUT OF TOWN NEXT WEEK. HIS RX WAS FOR ONLY #20. HE STATED THAT HE WAS TAKING THEM 1-2 TIMES PER DAY.  Initial call taken by: Warnell Forester,  September 15, 2007 12:45 PM    ok, Call  20 tablets Directions one twice a day, as needed, no refills.  Also instructions to see Dr. Kirtland Bouchard. for follow-up.   Prescriptions: LORAZEPAM 0.5 MG  TABS (LORAZEPAM) one twice daily as needed for anxiety or sleep  #20 x 0   Entered by:   Raechel Ache, RN   Authorized by:   Roderick Pee MD   Signed by:   Raechel Ache, RN on 09/15/2007   Method used:   Historical   RxID:   [id number]

## 2010-03-03 NOTE — Assessment & Plan Note (Signed)
Summary: 1 month f/up//db   Vital Signs:  Patient Profile:   60 Years Old Male Height:     66 inches (167.64 cm) Weight:      234.5 pounds Temp:     98.8 degrees F BP sitting:   140 / 94  Vitals Entered By: Sindy Guadeloupe RN (October 16, 2007 12:49 PM)                 Chief Complaint:  follow up.  History of Present Illness:  60 year old patient seen today for follow-up.  He was placed on Lexapro last visit due to a significant depression in the setting of separation from a long-time significant other.  He continues to be followed by a counselor and feels that he has benefited nicely from the medication.  He has taken his blood pressure medicines only sporadically of late.  His blood pressure was a bit elevated today.  Compliance issues stressed. no cardiopulmonary complaints    Current Allergies (reviewed today): No known allergies       Physical Exam  General:     overweight-appearing.  140/90 Psych:     dysphoric affect.      Impression & Recommendations:  Problem # 1:  ADJUSTMENT DISORDER WITH DEPRESSED MOOD (ICD-309.0)  Problem # 2:  HYPERTENSION (ICD-401.9)  His updated medication list for this problem includes:    Cozaar 50 Mg Tabs (Losartan potassium) .Marland Kitchen... Take 1 tablet by mouth once a day    Toprol Xl 50 Mg Tb24 (Metoprolol succinate) .Marland Kitchen... 1 tablet by mouth once a day   Complete Medication List: 1)  Aspirin 325 Mg Tabs (Aspirin) .... Take 1 tablet by mouth once a day 2)  Cozaar 50 Mg Tabs (Losartan potassium) .... Take 1 tablet by mouth once a day 3)  Lipitor 80 Mg Tabs (Atorvastatin calcium) .... Take 1 tablet by mouth once a day 4)  Toprol Xl 50 Mg Tb24 (Metoprolol succinate) .Marland Kitchen.. 1 tablet by mouth once a day 5)  Zetia 10 Mg Tabs (Ezetimibe) .Marland Kitchen.. 1 once daily 6)  Lexapro 10 Mg Tabs (Escitalopram oxalate) .... One daily 7)  Lorazepam 0.5 Mg Tabs (Lorazepam) .... One twice daily as needed for anxiety or sleep   Patient Instructions: 1)   Please schedule a follow-up appointment in 3 months. 2)  Limit your Sodium (Salt) to less than 2 grams a day(slightly less than 1/2 a teaspoon) to prevent fluid retention, swelling, or worsening of symptoms. 3)  It is important that you exercise regularly at least 20 minutes 5 times a week. If you develop chest pain, have severe difficulty breathing, or feel very tired , stop exercising immediately and seek medical attention. 4)  You need to lose weight. Consider a lower calorie diet and regular exercise.    Prescriptions: LORAZEPAM 0.5 MG  TABS (LORAZEPAM) one twice daily as needed for anxiety or sleep  #90 x 3   Entered and Authorized by:   Gordy Savers  MD   Signed by:   Gordy Savers  MD on 10/16/2007   Method used:   Print then Give to Patient   RxID:   [id number] LEXAPRO 10 MG  TABS (ESCITALOPRAM OXALATE) one daily  #90 x 6   Entered and Authorized by:   Gordy Savers  MD   Signed by:   Gordy Savers  MD on 10/16/2007   Method used:   Print then Give to Patient   RxID:   [id number] ZETIA 10 MG  TABS (EZETIMIBE) 1 once daily  #90 x 6   Entered and Authorized by:   Gordy Savers  MD   Signed by:   Gordy Savers  MD on 10/16/2007   Method used:   Print then Give to Patient   RxID:   [id number] TOPROL XL 50 MG TB24 (METOPROLOL SUCCINATE) 1 tablet by mouth once a day  #90 x 6   Entered and Authorized by:   Gordy Savers  MD   Signed by:   Gordy Savers  MD on 10/16/2007   Method used:   Print then Give to Patient   RxID:   [id number] LIPITOR 80 MG TABS (ATORVASTATIN CALCIUM) Take 1 tablet by mouth once a day  #90 x 6   Entered and Authorized by:   Gordy Savers  MD   Signed by:   Gordy Savers  MD on 10/16/2007   Method used:   Print then Give to Patient   RxID:   [id number] COZAAR 50 MG TABS (LOSARTAN POTASSIUM) Take 1 tablet by mouth once a day  #90 x 6   Entered and Authorized by:   Gordy Savers  MD   Signed by:   Gordy Savers  MD on 10/16/2007   Method used:   Print then Give to Patient   RxID:   [id number]  ]

## 2010-03-03 NOTE — Assessment & Plan Note (Signed)
Summary: cpx/njr/pt rescd//ccm  And a  Vital Signs:  Patient profile:   60 year old male Height:      64.5 inches Weight:      246 pounds Temp:     98.0 degrees F oral BP sitting:   100 / 68  (left arm) Cuff size:   regular  Vitals Entered By: Duard Brady LPN (July 28, 2009 3:04 PM) CC: cpx - doing ok Is Patient Diabetic? No   CC:  cpx - doing ok.  History of Present Illness: 60 year old patient who is seen today for a wellness exam.  Medical problems include coronary artery disease.  He is status post CABG as well by cardiology.  He has dyslipidemia.  Unfortunately, he has not been compliant with his Lipitor and recent LDL cholesterol was 1988.  He has treated hypertension, obesity, and history of congestive heart failure.  Preventive Screening-Counseling & Management  Alcohol-Tobacco     Smoking Status: never  Allergies (verified): No Known Drug Allergies  Past History:  Past Medical History: Reviewed history from 12/09/2008 and no changes required. Current Problems:  HYPERLIPIDEMIA (ICD-272.4) CORONARY ARTERY DISEASE (ICD-414.00) MYOCARDIAL INFARCTION, HX OF (ICD-412) HYPERTENSION (ICD-401.9) ADJUSTMENT DISORDER WITH DEPRESSED MOOD (ICD-309.0) CELLULITIS, RIGHT LEG (ICD-682.6) NEPHROLITHIASIS, HX OF (ICD-V13.01)  Past Surgical History: Reviewed history from 04/01/2009 and no changes required. Coronary artery bypass graft June 2006 colonoscopy September 2010  Family History: Reviewed history from 10/14/2006 and no changes required. father died age 96, history of heart failure, peptic ulcer disease mother is 25 doing quite well as in history crowbars A. status post PCI hypertension, dyslipidemia two brothers, one with hypertension one died a suicide death  Social History: Reviewed history from 07/23/2008 and no changes required. has a girlfriend of 20 years duration Regular exercise-no Tobacco Use - No.  Alcohol Use - no  Review of Systems  The  patient denies anorexia, fever, weight loss, weight gain, vision loss, decreased hearing, hoarseness, chest pain, syncope, dyspnea on exertion, peripheral edema, prolonged cough, headaches, hemoptysis, abdominal pain, melena, hematochezia, severe indigestion/heartburn, hematuria, incontinence, genital sores, muscle weakness, suspicious skin lesions, transient blindness, difficulty walking, depression, unusual weight change, abnormal bleeding, enlarged lymph nodes, angioedema, breast masses, and testicular masses.    Physical Exam  General:  overweight-appearing.  130/70overweight-appearing.   Head:  Normocephalic and atraumatic without obvious abnormalities. No apparent alopecia or balding. Eyes:  No corneal or conjunctival inflammation noted. EOMI. Perrla. Funduscopic exam benign, without hemorrhages, exudates or papilledema. Vision grossly normal. Ears:  External ear exam shows no significant lesions or deformities.  Otoscopic examination reveals clear canals, tympanic membranes are intact bilaterally without bulging, retraction, inflammation or discharge. Hearing is grossly normal bilaterally. Nose:  External nasal examination shows no deformity or inflammation. Nasal mucosa are pink and moist without lesions or exudates. Mouth:  Oral mucosa and oropharynx without lesions or exudates.  Teeth in good repair. Neck:  No deformities, masses, or tenderness noted. Chest Wall:  sternotomy scar Breasts:  No masses or gynecomastia noted Lungs:  Normal respiratory effort, chest expands symmetrically. Lungs are clear to auscultation, no crackles or wheezes. Heart:  Normal rate and regular rhythm. S1 and S2 normal without gallop, murmur, click, rub or other extra sounds. Abdomen:  Bowel sounds positive,abdomen soft and non-tender without masses, organomegaly or hernias noted. Rectal:  No external abnormalities noted. Normal sphincter tone. No rectal masses or tenderness. Genitalia:  Testes bilaterally  descended without nodularity, tenderness or masses. No scrotal masses or lesions. No penis lesions  or urethral discharge. Prostate:  2+ enlarged.  2+ enlarged.   Msk:  No deformity or scoliosis noted of thoracic or lumbar spine.   Pulses:  R and L carotid,radial,femoral,dorsalis pedis and posterior tibial pulses are full and equal bilaterally Extremities:  No clubbing, cyanosis, edema, or deformity noted with normal full range of motion of all joints.   Neurologic:  alert & oriented X3, cranial nerves II-XII intact, strength normal in all extremities, sensation intact to light touch, and gait normal.  alert & oriented X3, cranial nerves II-XII intact, strength normal in all extremities, sensation intact to light touch, and gait normal.   Skin:  Intact without suspicious lesions or rashes Cervical Nodes:  No lymphadenopathy noted Axillary Nodes:  No palpable lymphadenopathy Inguinal Nodes:  No significant adenopathy Psych:  Cognition and judgment appear intact. Alert and cooperative with normal attention span and concentration. No apparent delusions, illusions, hallucinations   Impression & Recommendations:  Problem # 1:  EXAMINATION, ROUTINE MEDICAL (ICD-V70.0)  Complete Medication List: 1)  Aspirin 325 Mg Tabs (Aspirin) .... Take 1 tablet by mouth once a day 2)  Cozaar 100 Mg Tabs (Losartan potassium) .... Take one tablet by mouth daily 3)  Lipitor 80 Mg Tabs (Atorvastatin calcium) .... Take 1 tablet by mouth once a day 4)  Toprol Xl 50 Mg Tb24 (Metoprolol succinate) .Marland Kitchen.. 1 tablet by mouth once a day 5)  Zetia 10 Mg Tabs (Ezetimibe) .Marland Kitchen.. 1 once daily 6)  Lorazepam 0.5 Mg Tabs (Lorazepam) .... One twice daily as needed for anxiety or sleep 7)  Furosemide 40 Mg Tabs (Furosemide) .... One every morning 8)  Aleve 220 Mg Tabs (Naproxen sodium) .... As needed  Other Orders: Prescription Created Electronically [id number])  Patient Instructions: 1)  Please schedule a follow-up appointment in 6  months. 2)  Limit your Sodium (Salt). 3)  It is important that you exercise regularly at least 20 minutes 5 times a week. If you develop chest pain, have severe difficulty breathing, or feel very tired , stop exercising immediately and seek medical attention. 4)  You need to lose weight. Consider a lower calorie diet and regular exercise.  Prescriptions: FUROSEMIDE 40 MG TABS (FUROSEMIDE) one every morning  #90 x 6   Entered and Authorized by:   Gordy Savers  MD   Signed by:   Gordy Savers  MD on 07/28/2009   Method used:   [facility]        [facility]  [place] [address] [facility]       [address]       [place], [place]  [address]       Ph: [phone] or [phone]       Fax: [phone]   RxID:   [id number] ZETIA 10 MG  TABS (EZETIMIBE) 1 once daily  #90 x 6   Entered and Authorized by:   Gordy Savers  MD   Signed by:   Gordy Savers  MD on 07/28/2009   Method used:   [facility]ically [facility]        [facility]  [place] [address] [facility]       [address]       [place], [place]  [address]       Ph: [phone] or [phone]       Fax: [phone]   RxID:   [id number] TOPROL XL 50 MG TB24 (METOPROLOL SUCCINATE) 1 tablet by mouth once a day  #90 x 6  Entered and Authorized by:   Gordy Savers  MD   Signed by:   Gordy Savers  MD on 07/28/2009   Method used:   [facility]ically to        [facility]  [place] [address] ([facility]ail[facility]       [address]       [place], [place]  [address]       Ph: [phone] or [phone]       Fax: [phone]   RxID:   [id number] LIPITOR 80 MG TABS (ATORVASTATIN CALCIUM) Take 1 tablet by mouth once a day  #90 x 6   Entered and Authorized by:   Gordy Savers  MD   Signed by:   Gordy Savers  MD on 07/28/2009   Method used:   [facility]        [facility]  [place] [address] ([facility]ail[facility]       [address]       [place], [place]  [address]       Ph: [phone] or [phone]       Fax: [phone]   RxID:    [id number] COZAAR 100 MG TABS (LOSARTAN POTASSIUM) Take one tablet by mouth daily  #90 x 12   Entered and Authorized by:   Gordy Savers  MD   Signed by:   Gordy Savers  MD on 07/28/2009   Method used:   [facility]ically [facility]        [facility]  [place] [address] ([facility]ail)       [address]       [place], [place]  [address]       Ph: [phone] or [phone]       Fax: [phone]   RxID:   [id number]

## 2010-03-03 NOTE — Assessment & Plan Note (Signed)
Summary: cpx/yy/PT RESCD/CCM/res per pt/jnl [facility] PER PT/NJR/res per pt/jnl   Vital Signs:  Patient Profile:   60 Years Old Male Height:     66 inches (167.64 cm) Weight:      232 pounds BMI:     37.58 Temp:     98.5 degrees F oral BP sitting:   136 / 84  (left arm)  Vitals Entered By: Raechel Ache, RN (October 14, 2006 1:31 PM)                 Chief Complaint:  CPX and labs done. Sees cardiologist..  History of Present Illness: 60 year old patient seen today for an annual exam.  He has a history of coronaryl artery disease, dyslipidemia, and hypertensionarea and is doing well . H was hospitalized [facility] at [facility] in with a cellulitis involving the right lower extremity  Current Allergies: No known allergies    Family History:    father died age 85, history of heart failure, peptic ulcer disease    mother is 19 doing quite well as in history crowbars A. status post PCI hypertension, dyslipidemia    two brothers, one with hypertension one died a suicide death  Social History:    has a girlfriend of 20 years duration    Review of Systems  The patient denies anorexia, fever, weight loss, vision loss, decreased hearing, hoarseness, chest pain, syncope, dyspnea on exhertion, peripheral edema, prolonged cough, hemoptysis, abdominal pain, melena, hematochezia, severe indigestion/heartburn, hematuria, incontinence, genital sores, muscle weakness, suspicious skin lesions, transient blindness, difficulty walking, depression, unusual weight change, abnormal bleeding, enlarged lymph nodes, angioedema, breast masses, and testicular masses.     Physical Exam  General:     overweight-appearing.   Head:     Normocephalic and atraumatic without obvious abnormalities. No apparent alopecia or balding. Eyes:     No corneal or conjunctival inflammation noted. EOMI. Perrla. Funduscopic exam benign, without hemorrhages, exudates or papilledema. Vision grossly normal. Ears:      External ear exam shows no significant lesions or deformities.  Otoscopic examination reveals clear canals, tympanic membranes are intact bilaterally without bulging, retraction, inflammation or discharge. Hearing is grossly normal bilaterally. Mouth:     Oral mucosa and oropharynx without lesions or exudates.  Teeth in good repair. Neck:     No deformities, masses, or tenderness noted. Chest Wall:     No deformities, masses, tenderness or gynecomastia noted. Lungs:     Normal respiratory effort, chest expands symmetrically. Lungs are clear to auscultation, no crackles or wheezes. Heart:     Normal rate and regular rhythm. S1 and S2 normal without gallop, murmur, click, rub or other extra sounds. Abdomen:     Bowel sounds positive,abdomen soft and non-tender without masses, organomegaly or hernias noted. Rectal:     No external abnormalities noted. Normal sphincter tone. No rectal masses or tenderness. Genitalia:     Testes bilaterally descended without nodularity, tenderness or masses. No scrotal masses or lesions. No penis lesions or urethral discharge. Prostate:     Prostate gland firm and smooth, no enlargement, nodularity, tenderness, mass, asymmetry or induration. Msk:     No deformity or scoliosis noted of thoracic or lumbar spine.   Pulses:     R and L carotid,radial,femoral,dorsalis pedis and posterior tibial pulses are full and equal bilaterally Extremities:     No clubbing, cyanosis, edema, or deformity noted with normal full range of motion of all joints.   Neurologic:  No cranial nerve deficits noted. Station and gait are normal. Plantar reflexes are down-going bilaterally. DTRs are symmetrical throughout. Sensory, motor and coordinative functions appear intact. Skin:     Intact without suspicious lesions or rashes Cervical Nodes:     No lymphadenopathy noted Axillary Nodes:     No palpable lymphadenopathy Inguinal Nodes:     No significant adenopathy Psych:      Cognition and judgment appear intact. Alert and cooperative with normal attention span and concentration. No apparent delusions, illusions, hallucinations    Impression & Recommendations:  Problem # 1:  NEPHROLITHIASIS, HX OF (ICD-V13.01)  Problem # 2:  CORONARY ARTERY DISEASE (ICD-414.00)  His updated medication list for this problem includes:    Aspirin 325 Mg Tabs (Aspirin) .Marland Kitchen... Take 1 tablet by mouth once a day    Cozaar 50 Mg Tabs (Losartan potassium) .Marland Kitchen... Take 1 tablet by mouth once a day    Toprol Xl 50 Mg Tb24 (Metoprolol succinate) .Marland Kitchen... 1 tablet by mouth once a day   Problem # 3:  HYPERLIPIDEMIA (ICD-272.4)  His updated medication list for this problem includes:    Lipitor 80 Mg Tabs (Atorvastatin calcium) .Marland Kitchen... Take 1 tablet by mouth once a day    Zetia 10 Mg Tabs (Ezetimibe) .Marland Kitchen... 1 once daily   Problem # 4:  HYPERTENSION (ICD-401.9)  His updated medication list for this problem includes:    Cozaar 50 Mg Tabs (Losartan potassium) .Marland Kitchen... Take 1 tablet by mouth once a day    Toprol Xl 50 Mg Tb24 (Metoprolol succinate) .Marland Kitchen... 1 tablet by mouth once a day   Complete Medication List: 1)  Aspirin 325 Mg Tabs (Aspirin) .... Take 1 tablet by mouth once a day 2)  Cozaar 50 Mg Tabs (Losartan potassium) .... Take 1 tablet by mouth once a day 3)  Lipitor 80 Mg Tabs (Atorvastatin calcium) .... Take 1 tablet by mouth once a day 4)  Toprol Xl 50 Mg Tb24 (Metoprolol succinate) .Marland Kitchen.. 1 tablet by mouth once a day 5)  Zetia 10 Mg Tabs (Ezetimibe) .Marland Kitchen.. 1 once daily  Other Orders: Gastroenterology Referral (GI)   Patient Instructions: 1)  Please schedule a follow-up appointment in 1 year. 2)  Limit your Sodium (Salt) to less than 2 grams a day(slightly less than 1/2 a teaspoon) to prevent fluid retention, swelling, or worsening of symptoms. 3)  It is important that you exercise regularly at least 20 minutes 5 times a week. If you develop chest pain, have severe difficulty  breathing, or feel very tired , stop exercising immediately and seek medical attention. 4)  You need to lose weight. Consider a lower calorie diet and regular exercise.  5)  Schedule a colonoscopy/sigmoidoscopy to help detect colon cancer. 6)  Take an Aspirin every day.    Prescriptions: ZETIA 10 MG  TABS (EZETIMIBE) 1 once daily  #90 x 6   Entered and Authorized by:   Gordy Savers  MD   Signed by:   Gordy Savers  MD on 10/14/2006   Method used:   Print then Give to Patient   RxID:   [id number] TOPROL XL 50 MG TB24 (METOPROLOL SUCCINATE) 1 tablet by mouth once a day  #90 x 6   Entered and Authorized by:   Gordy Savers  MD   Signed by:   Gordy Savers  MD on 10/14/2006   Method used:   Print then Give to Patient   RxID:   [id number] LIPITOR 80  MG TABS (ATORVASTATIN CALCIUM) Take 1 tablet by mouth once a day  #90 x 6   Entered and Authorized by:   Gordy Savers  MD   Signed by:   Gordy Savers  MD on 10/14/2006   Method used:   Print then Give to Patient   RxID:   [id number] COZAAR 50 MG TABS (LOSARTAN POTASSIUM) Take 1 tablet by mouth once a day  #90 x 6   Entered and Authorized by:   Gordy Savers  MD   Signed by:   Gordy Savers  MD on 10/14/2006   Method used:   Print then Give to Patient   RxID:   [id number]  ]

## 2010-03-03 NOTE — Procedures (Signed)
Summary: Colonoscopy   Colonoscopy  Procedure date:  10/28/2008  Findings:      Location:  [facility].    Procedures Next Due Date:    Colonoscopy: 11/2013 COLONOSCOPY PROCEDURE REPORT  PATIENT:  [name], [name]  MR#:  [id number] BIRTHDATE:   13-Aug-1950, 60 yrs. old   GENDER:   male  ENDOSCOPIST:   Barbette Hair. Arlyce Dice, MD Referred by: Eleonore Chiquito, M.D.  PROCEDURE DATE:  10/28/2008 PROCEDURE:  Colonoscopy with snare polypectomy, Colon with cold biopsy polypectomy ASA CLASS:   Class II INDICATIONS: Routine Risk Screening   MEDICATIONS:    Fentanyl 75 mcg IV, Versed 8 mg IV  DESCRIPTION OF PROCEDURE:   After the risks benefits and alternatives of the procedure were thoroughly explained, informed consent was obtained.  Digital rectal exam was performed and revealed no abnormalities.   The LB [id number]-H180AL [id number] endoscope was introduced through the anus and advanced to the cecum, which was identified by the ileocecal valve, without limitations.  The quality of the prep was good, using MiraLax.  The instrument was then slowly withdrawn as the colon was fully examined. <<PROCEDUREIMAGES>>                              <<OLD IMAGES>>  FINDINGS:  Two polyps were found in the sigmoid colon (see image16, image20, image21, and image22). Polyp at 20cm and 15cm from anus, 5 and 12mm - removed with cold snare and hot snare, respectively and submitted to pathology  A sessile polyp was found in the descending colon. It was 2 mm in size. The polyp was removed using cold biopsy forceps.  Mild diverticulosis was found sigmoid to descending  This was otherwise a normal examination of the colon (see image2, image4, image6, image7, image8, image9, image10, image15, image18, and image19).   Retroflexed views in the rectum revealed no abnormalities.    The scope was then withdrawn from the patient and the procedure completed.  COMPLICATIONS:   None  ENDOSCOPIC IMPRESSION:  1) Two polyps  in the sigmoid colon  2) 2 mm sessile polyp in the descending colon  3) Mild diverticulosis in the sigmoid to descending  4) Otherwise normal examination RECOMMENDATIONS:  1) If the polyp(s) removed today are proven to be adenomatous (pre-cancerous) polyps, you will need a repeat colonoscopy in 5 years. Otherwise you should continue to follow colorectal cancer screening guidelines for "routine risk" patients with colonoscopy in 10 years.  REPEAT EXAM:   In You will receive a letter from Dr. Arlyce Dice in 1-2 weeks, after reviewing the final pathology, with followup recommendations. for.   _______________________________ Barbette Hair. Arlyce Dice, MD  CC:         REPORT OF SURGICAL PATHOLOGY   Case #: [id number] Patient Name: Tony, Foster Office Chart Number:  N/A MRN: [id number] Pathologist: Ferd Hibbs. Colonel Bald, MD DOB/Age  [birth year] (Age: 60)    Gender: M Date Taken:  10/28/2008 Date Received: 10/28/2008   FINAL DIAGNOSIS   ***MICROSCOPIC EXAMINATION AND DIAGNOSIS***   1.  COLON, DESCENDING, POLYP:   - TUBULAR ADENOMA. - HIGH GRADE DYSPLASIA IS NOT IDENTIFIED.   2.  COLON, SIGMOID, POLYPS (2):   - TUBULAR ADENOMAS (2). - HIGH GRADE DYSPLASIA IS NOT IDENTIFIED.    mj Date Reported:  10/29/2008     Ferd Hibbs. Colonel Bald, MD *** Electronically Signed Out By JBK ***   October 30, 2008 MRN: [id number]    [facility] [address] [place], [place]  [address]    Dear Mr. [facility],  I am pleased to inform you that the colon polyp(s) removed during your recent colonoscopy was (were) found to be benign (no cancer detected) upon pathologic examination.  I recommend you have a repeat colonoscopy examination in 5_ years to look for recurrent polyps, as having colon polyps increases your risk for having recurrent polyps or even colon cancer in the future.  Should you develop new or worsening symptoms of abdominal pain, bowel habit changes or bleeding from the rectum or bowels,  please schedule an evaluation with either your primary care physician or with me.  Additional information/recommendations:  __ No further action with gastroenterology is needed at this time. Please      follow-up with your primary care physician for your other healthcare      needs.  __ Please call [phone] to schedule a return visit to review your      situation.  __ Please keep your follow-up visit as already scheduled.  _x_ Continue treatment plan as outlined the day of your exam.  Please call us if you are having persistent problems or have questions about your condition that have not been fully answered at this time.  Sincerely,  Louis Meckel MD  This letter has been electronically signed by your physician.   Signed by Louis Meckel MD on 10/30/2008 at 11:35 AM

## 2010-03-03 NOTE — Assessment & Plan Note (Signed)
Summary: ?FLU Cyndia Skeeters   Vital Signs:  Patient Profile:   60 Years Old Male Height:     66 inches (167.64 cm) Weight:      227 pounds Temp:     98.8 degrees F oral Pulse rate:   82 / minute Pulse rhythm:   regular Resp:     12 per minute BP sitting:   120 / 82  Vitals Entered By: Lynann Beaver CMA (December 06, 2006 9:34 AM)                 Chief Complaint:  URI symptoms, chest congestion, and cough x 3-4 days.  History of Present Illness: 60 year old patient with a 3 to 4-day history of URI symptoms with large nonproductive cough, weakness, fatigue, general sense of unwellness  Current Allergies: No known allergies       Physical Exam  General:     overweight-appearing.  no acute distress Head:     Normocephalic and atraumatic without obvious abnormalities. No apparent alopecia or balding. Eyes:     No corneal or conjunctival inflammation noted. EOMI. Perrla. Funduscopic exam benign, without hemorrhages, exudates or papilledema. Vision grossly normal. Ears:     External ear exam shows no significant lesions or deformities.  Otoscopic examination reveals clear canals, tympanic membranes are intact bilaterally without bulging, retraction, inflammation or discharge. Hearing is grossly normal bilaterally. Mouth:     Oral mucosa and oropharynx without lesions or exudates.  Teeth in good repair. Neck:     No deformities, masses, or tenderness noted. Lungs:     Normal respiratory effort, chest expands symmetrically. Lungs are clear to auscultation, no crackles or wheezes. Heart:     Normal rate and regular rhythm. S1 and S2 normal without gallop, murmur, click, rub or other extra sounds. Abdomen:     Bowel sounds positive,abdomen soft and non-tender without masses, organomegaly or hernias noted.    Impression & Recommendations:  Problem # 1:  UPPER RESPIRATORY INFECTION, VIRAL (ICD-465.9)  His updated medication list for this problem includes:    Aspirin 325 Mg  Tabs (Aspirin) .Marland Kitchen... Take 1 tablet by mouth once a day   Complete Medication List: 1)  Aspirin 325 Mg Tabs (Aspirin) .... Take 1 tablet by mouth once a day 2)  Cozaar 50 Mg Tabs (Losartan potassium) .... Take 1 tablet by mouth once a day 3)  Lipitor 80 Mg Tabs (Atorvastatin calcium) .... Take 1 tablet by mouth once a day 4)  Toprol Xl 50 Mg Tb24 (Metoprolol succinate) .Marland Kitchen.. 1 tablet by mouth once a day 5)  Zetia 10 Mg Tabs (Ezetimibe) .Marland Kitchen.. 1 once daily   Patient Instructions: 1)  Please schedule a follow-up appointment as needed. 2)  Drink as much fluid as you can tolerate for the next few days. 3)  Acute bronchitis symptoms for less than 10 days are not helped by antibiotics. take over the counter cough medications. call if no improvment in  5-7 days, sooner if increasing cough, fever, or new symptoms( shortness of breath, chest pain).    ]

## 2010-03-03 NOTE — Assessment & Plan Note (Signed)
Summary: severe depression/dm   Vital Signs:  Patient Profile:   60 Years Old Male Height:     66 inches (167.64 cm) Weight:      227 pounds BP sitting:   144 / 100  (left arm) Cuff size:   regular  Vitals Entered By: Raechel Ache, RN (September 07, 2007 12:47 PM)                 Chief Complaint:  C/o severe depression; quit all meds 1 month ago.Marland Kitchen  History of Present Illness:  60 year old gentleman, who is seen today for evaluation of depression.  He and a significant other terminated there 20 relationship recently, and he is having significant depression, as well as guilt.  He does have a counselor that he has been seen since beginning of the year and saw  3 days ago.  He has been resistant of antidepressant medication in the past.  He does admit to some suicide ideations, but denies being suicidal at this time.  He has been off all his medications    Current Allergies: No known allergies   Past Medical History:    Reviewed history from 08/09/2006 and no changes required:       Hyperlipidemia       Hypertension       Myocardial infarction, hx of       Nephrolithiasis, hx of       Coronary artery disease       Mild obesity      Physical Exam  General:     overweight-appearing. 140/100 Head:     Normocephalic and atraumatic without obvious abnormalities. No apparent alopecia or balding. Mouth:     Oral mucosa and oropharynx without lesions or exudates.  Teeth in good repair. Neck:     No deformities, masses, or tenderness noted. Lungs:     Normal respiratory effort, chest expands symmetrically. Lungs are clear to auscultation, no crackles or wheezes. Heart:     Normal rate and regular rhythm. S1 and S2 normal without gallop, murmur, click, rub or other extra sounds. Psych:     depressed affect.      Impression & Recommendations:  Problem # 1:  ADJUSTMENT DISORDER WITH DEPRESSED MOOD (ICD-309.0) will place on Lexapro 10 mg daily, and was given a limited  supply;  will also give a small-volume prescription for lorazepam to take h.s. to assist with sleep;  he was encouraged to follow-up with his behavioral health specialist and will recheck here in 4 weeks;  he was told that he becomes frankly suicidal to report to the behavioral health Hospital for evaluation and inpatient treatment  Problem # 2:  CORONARY ARTERY DISEASE (ICD-414.00)  His updated medication list for this problem includes:    Aspirin 325 Mg Tabs (Aspirin) .Marland Kitchen... Take 1 tablet by mouth once a day    Cozaar 50 Mg Tabs (Losartan potassium) .Marland Kitchen... Take 1 tablet by mouth once a day    Toprol Xl 50 Mg Tb24 (Metoprolol succinate) .Marland Kitchen... 1 tablet by mouth once a day   Problem # 3:  HYPERTENSION (ICD-401.9)  His updated medication list for this problem includes:    Cozaar 50 Mg Tabs (Losartan potassium) .Marland Kitchen... Take 1 tablet by mouth once a day    Toprol Xl 50 Mg Tb24 (Metoprolol succinate) .Marland Kitchen... 1 tablet by mouth once a day   Complete Medication List: 1)  Aspirin 325 Mg Tabs (Aspirin) .... Take 1 tablet by mouth once a day  2)  Cozaar 50 Mg Tabs (Losartan potassium) .... Take 1 tablet by mouth once a day 3)  Lipitor 80 Mg Tabs (Atorvastatin calcium) .... Take 1 tablet by mouth once a day 4)  Toprol Xl 50 Mg Tb24 (Metoprolol succinate) .Marland Kitchen.. 1 tablet by mouth once a day 5)  Zetia 10 Mg Tabs (Ezetimibe) .Marland Kitchen.. 1 once daily 6)  Avelox 400 Mg Tabs (Moxifloxacin hcl) .... One daily 7)  Lexapro 10 Mg Tabs (Escitalopram oxalate) .... One daily 8)  Lorazepam 0.5 Mg Tabs (Lorazepam) .... One twice daily as needed for anxiety or sleep   Patient Instructions: 1)  Please schedule a follow-up appointment in 1 month. 2)  follow-up with counselor as scheduled   Prescriptions: LORAZEPAM 0.5 MG  TABS (LORAZEPAM) one twice daily as needed for anxiety or sleep  #20 x 0   Entered and Authorized by:   Gordy Savers  MD   Signed by:   Gordy Savers  MD on 09/07/2007   Method used:   Print  then Give to Patient   RxID:   [id number]  ]

## 2010-03-03 NOTE — Assessment & Plan Note (Signed)
Summary: cellulitis/ccm   Vital Signs:  Patient Profile:   60 Years Old Male Height:     66 inches (167.64 cm) Weight:      233 pounds Temp:     99.3 degrees F oral BP sitting:   130 / 70  (left arm) Cuff size:   regular  Vitals Entered By: Sid Falcon LPN (July 03, 1912 11:24 AM)                 Chief Complaint:  06 Hx bypass surg, 08 rt lower leg cellulitis, last night fever, and chills and noted rt leg reddness again.  History of Present Illness: 60 year old gentleman with a less than 24-hour history of some fever, chills, and inflammatory changes involving his right lower leg.  His house was briefly 12 months ago for right lower leg cellulitis.  He is status post CABG and vein stripping.  Today he feels somewhat improved    Current Allergies: No known allergies   Past Medical History:    Reviewed history from 08/09/2006 and no changes required:       Hyperlipidemia       Hypertension       Myocardial infarction, hx of       Nephrolithiasis, hx of       Coronary artery disease       Mild obesity     Review of Systems       The patient complains of fever.         stable cardiopulmonary status   Physical Exam  General:     overweight-appearing.   Head:     Normocephalic and atraumatic without obvious abnormalities. No apparent alopecia or balding. Mouth:     Oral mucosa and oropharynx without lesions or exudates.  Teeth in good repair. Neck:     No deformities, masses, or tenderness noted. Lungs:     Normal respiratory effort, chest expands symmetrically. Lungs are clear to auscultation, no crackles or wheezes. Heart:     Normal rate and regular rhythm. S1 and S2 normal without gallop, murmur, click, rub or other extra sounds. Abdomen:     Bowel sounds positive,abdomen soft and non-tender without masses, organomegaly or hernias noted. Extremities:     patient had erythema, slight tenderness and excessive warmth involving his right anterior lower  leg from the mid calf area to the ankle    Impression & Recommendations:  Problem # 1:  CELLULITIS, RIGHT LEG (ICD-682.6)  The following medications were removed from the medication list:    Doxycycline Monohydrate 100 Mg Caps (Doxycycline monohydrate) ..... One tab by mouth two times a day  His updated medication list for this problem includes:    Avelox 400 Mg Tabs (Moxifloxacin hcl) ..... One daily   Problem # 2:  CORONARY ARTERY DISEASE (ICD-414.00)  His updated medication list for this problem includes:    Aspirin 325 Mg Tabs (Aspirin) .Marland Kitchen... Take 1 tablet by mouth once a day    Cozaar 50 Mg Tabs (Losartan potassium) .Marland Kitchen... Take 1 tablet by mouth once a day    Toprol Xl 50 Mg Tb24 (Metoprolol succinate) .Marland Kitchen... 1 tablet by mouth once a day   Problem # 3:  HYPERTENSION (ICD-401.9)  His updated medication list for this problem includes:    Cozaar 50 Mg Tabs (Losartan potassium) .Marland Kitchen... Take 1 tablet by mouth once a day    Toprol Xl 50 Mg Tb24 (Metoprolol succinate) .Marland Kitchen... 1 tablet by mouth once a day  Complete Medication List: 1)  Aspirin 325 Mg Tabs (Aspirin) .... Take 1 tablet by mouth once a day 2)  Cozaar 50 Mg Tabs (Losartan potassium) .... Take 1 tablet by mouth once a day 3)  Lipitor 80 Mg Tabs (Atorvastatin calcium) .... Take 1 tablet by mouth once a day 4)  Toprol Xl 50 Mg Tb24 (Metoprolol succinate) .Marland Kitchen.. 1 tablet by mouth once a day 5)  Zetia 10 Mg Tabs (Ezetimibe) .Marland Kitchen.. 1 once daily 6)  Avelox 400 Mg Tabs (Moxifloxacin hcl) .... One daily   Patient Instructions: 1)  Please schedule a follow-up appointment in 4 months. 2)  call if you develop worsening pain, swelling, fever or chills   ]

## 2010-03-03 NOTE — Letter (Signed)
Summary: Appointment - Reschedule  [facility]     [place], [place]    Phone:   Fax:      Jun 25, 2009 MRN: [id number]   [facility] [address] [place], [place]  [address]   Dear Mr. [facility],   Due to a change in our office schedule, your appointment on                       at                must be changed.  It is very important that we reach you to reschedule this appointment. We look forward to participating in your health care needs. Please contact us at the number listed above at your earliest convenience to reschedule this appointment.     Sincerely,  Glass blower/designer

## 2010-03-03 NOTE — Assessment & Plan Note (Signed)
Summary: cpx/njr/PT R/S..SLM   Vital Signs:  Patient profile:   60 year old male Weight:      258 pounds BMI:     41.79 BP sitting:   130 / 86  (left arm) Cuff size:   large  Vitals Entered By: Raechel Ache, RN (July 15, 2008 2:57 PM)  CC:  CPX and labs done. Sees cardio next week.Marland Kitchen  History of Present Illness: 60 year old male, who is seen  for a wellness exam.  has a history of coronary artery disease and is scheduled to see cardiology next week.  When prompted, is awake, Dan of 26 pounds over the past year or so.  His cardiac status seems stable.  Denies any exertional chest pain.  He has hypertension and dyslipidemia.  He has now Lexapro for situational depression, which has greatly and proved.  Since he separated from his girlfriend, he has been eating out almost every meal.  Preventive Screening-Counseling & Management  Caffeine-Diet-Exercise     Does Patient Exercise: no  Problems Prior to Update: 1)  Adjustment Disorder With Depressed Mood  (ICD-309.0) 2)  Cellulitis, Right Leg  (ICD-682.6) 3)  Upper Respiratory Infection, Viral  (ICD-465.9) 4)  Examination, Routine Medical  (ICD-V70.0) 5)  Coronary Artery Disease  (ICD-414.00) 6)  Nephrolithiasis, Hx of  (ICD-V13.01) 7)  Myocardial Infarction, Hx of  (ICD-412) 8)  Hypertension  (ICD-401.9) 9)  Hyperlipidemia  (ICD-272.4)  Medications Prior to Update: 1)  Aspirin 325 Mg Tabs (Aspirin) .... Take 1 Tablet By Mouth Once A Day 2)  Cozaar 50 Mg Tabs (Losartan Potassium) .... Take 1 Tablet By Mouth Once A Day 3)  Lipitor 80 Mg Tabs (Atorvastatin Calcium) .... Take 1 Tablet By Mouth Once A Day 4)  Toprol Xl 50 Mg Tb24 (Metoprolol Succinate) .Marland Kitchen.. 1 Tablet By Mouth Once A Day 5)  Zetia 10 Mg  Tabs (Ezetimibe) .Marland Kitchen.. 1 Once Daily 6)  Lexapro 10 Mg  Tabs (Escitalopram Oxalate) .... One Daily 7)  Lorazepam 0.5 Mg  Tabs (Lorazepam) .... One Twice Daily As Needed For Anxiety or Sleep 8)  Lexapro 20 Mg Tabs (Escitalopram Oxalate)  .Marland Kitchen.. 1 Once Daily  Allergies: No Known Drug Allergies  Past History:  Past Medical History: Hyperlipidemia Hypertension Myocardial infarction, hx of Nephrolithiasis, hx of Coronary artery disease exogenous obesity  Past Surgical History: Coronary artery bypass graft June 2008  Social History: Reviewed history from 10/14/2006 and no changes required. has a girlfriend of 20 years duration Regular exercise-no Does Patient Exercise:  no  Review of Systems       The patient complains of weight gain.  The patient denies anorexia, fever, weight loss, vision loss, decreased hearing, hoarseness, chest pain, syncope, dyspnea on exertion, peripheral edema, prolonged cough, headaches, hemoptysis, abdominal pain, melena, hematochezia, severe indigestion/heartburn, hematuria, incontinence, genital sores, muscle weakness, suspicious skin lesions, transient blindness, difficulty walking, depression, unusual weight change, abnormal bleeding, enlarged lymph nodes, angioedema, breast masses, and testicular masses.    Physical Exam  General:  overweight-appearing.  140/84overweight-appearing.   Head:  Normocephalic and atraumatic without obvious abnormalities. No apparent alopecia or balding. Eyes:  No corneal or conjunctival inflammation noted. EOMI. Perrla. Funduscopic exam benign, without hemorrhages, exudates or papilledema. Vision grossly normal. Ears:  External ear exam shows no significant lesions or deformities.  Otoscopic examination reveals clear canals, tympanic membranes are intact bilaterally without bulging, retraction, inflammation or discharge. Hearing is grossly normal bilaterally. Nose:  External nasal examination shows no deformity or inflammation. Nasal mucosa  are pink and moist without lesions or exudates. Mouth:  pharyngeal crowding.  pharyngeal crowding.   Neck:  No deformities, masses, or tenderness noted. Chest Wall:  sternotomy  scar Breasts:  No masses or gynecomastia  noted Lungs:  Normal respiratory effort, chest expands symmetrically. Lungs are clear to auscultation, no crackles or wheezes. Heart:  Normal rate and regular rhythm. S1 and S2 normal without gallop, murmur, click, rub or other extra sounds. Abdomen:  obese soft and nontender Rectal:  No external abnormalities noted. Normal sphincter tone. No rectal masses or tenderness. Genitalia:  Testes bilaterally descended without nodularity, tenderness or masses. No scrotal masses or lesions. No penis lesions or urethral discharge. Prostate:  Prostate gland firm and smooth, no enlargement, nodularity, tenderness, mass, asymmetry or induration. Msk:  No deformity or scoliosis noted of thoracic or lumbar spine.   Pulses:  ppulses, full, except for a diminished left dorsalis pedis pulse Extremities:  No clubbing, cyanosis, edema, or deformity noted with normal full range of motion of all joints.   Neurologic:  No cranial nerve deficits noted. Station and gait are normal. Plantar reflexes are down-going bilaterally. DTRs are symmetrical throughout. Sensory, motor and coordinative functions appear intact. Skin:  Intact without suspicious lesions or rashes Cervical Nodes:  No lymphadenopathy noted Axillary Nodes:  No palpable lymphadenopathy Inguinal Nodes:  No significant adenopathy Psych:  Cognition and judgment appear intact. Alert and cooperative with normal attention span and concentration. No apparent delusions, illusions, hallucinations   Impression & Recommendations:  Problem # 1:  WEIGHT GAIN (ICD-783.1) will taper and discontinue the Lexapro; dietary issues discussed; will check in two months and referred to dietitian if no significant improvement in his weight  Problem # 2:  CORONARY ARTERY DISEASE (ICD-414.00)  His updated medication list for this problem includes:    Aspirin 325 Mg Tabs (Aspirin) .Marland Kitchen... Take 1 tablet by mouth once a day    Cozaar 50 Mg Tabs (Losartan potassium) .Marland Kitchen... Take 1  tablet by mouth once a day    Toprol Xl 50 Mg Tb24 (Metoprolol succinate) .Marland Kitchen... 1 tablet by mouth once a day  His updated medication list for this problem includes:    Aspirin 325 Mg Tabs (Aspirin) .Marland Kitchen... Take 1 tablet by mouth once a day    Cozaar 50 Mg Tabs (Losartan potassium) .Marland Kitchen... Take 1 tablet by mouth once a day    Toprol Xl 50 Mg Tb24 (Metoprolol succinate) .Marland Kitchen... 1 tablet by mouth once a day  Problem # 3:  HYPERTENSION (ICD-401.9)  His updated medication list for this problem includes:    Cozaar 50 Mg Tabs (Losartan potassium) .Marland Kitchen... Take 1 tablet by mouth once a day    Toprol Xl 50 Mg Tb24 (Metoprolol succinate) .Marland Kitchen... 1 tablet by mouth once a day  His updated medication list for this problem includes:    Cozaar 50 Mg Tabs (Losartan potassium) .Marland Kitchen... Take 1 tablet by mouth once a day    Toprol Xl 50 Mg Tb24 (Metoprolol succinate) .Marland Kitchen... 1 tablet by mouth once a day  Problem # 4:  HYPERLIPIDEMIA (ICD-272.4)  His updated medication list for this problem includes:    Lipitor 80 Mg Tabs (Atorvastatin calcium) .Marland Kitchen... Take 1 tablet by mouth once a day    Zetia 10 Mg Tabs (Ezetimibe) .Marland Kitchen... 1 once daily  His updated medication list for this problem includes:    Lipitor 80 Mg Tabs (Atorvastatin calcium) .Marland Kitchen... Take 1 tablet by mouth once a day    Zetia 10  Mg Tabs (Ezetimibe) .Marland Kitchen... 1 once daily  Problem # 5:  EXAMINATION, ROUTINE MEDICAL (ICD-V70.0)  Orders: Gastroenterology Referral (GI)  Complete Medication List: 1)  Aspirin 325 Mg Tabs (Aspirin) .... Take 1 tablet by mouth once a day 2)  Cozaar 50 Mg Tabs (Losartan potassium) .... Take 1 tablet by mouth once a day 3)  Lipitor 80 Mg Tabs (Atorvastatin calcium) .... Take 1 tablet by mouth once a day 4)  Toprol Xl 50 Mg Tb24 (Metoprolol succinate) .Marland Kitchen.. 1 tablet by mouth once a day 5)  Zetia 10 Mg Tabs (Ezetimibe) .Marland Kitchen.. 1 once daily 6)  Lorazepam 0.5 Mg Tabs (Lorazepam) .... One twice daily as needed for anxiety or sleep 7)  Lexapro 20  Mg Tabs (Escitalopram oxalate) .Marland Kitchen.. 1 once daily  Patient Instructions: 1)  Please schedule a follow-up appointment in 2 months. 2)  Limit your Sodium (Salt). 3)  It is important that you exercise regularly at least 20 minutes 5 times a week. If you develop chest pain, have severe difficulty breathing, or feel very tired , stop exercising immediately and seek medical attention. 4)  You need to lose weight. Consider a lower calorie diet and regular exercise.  5)  Schedule a colonoscopy/sigmoidoscopy to help detect colon cancer. 6)  Take an Aspirin every day. 7)  decrease Lexapro to 10 mg daily for two weeks, then 10 mg every other day for an additional two weeks, then discontinue Prescriptions: LORAZEPAM 0.5 MG  TABS (LORAZEPAM) one twice daily as needed for anxiety or sleep  #90 x 2   Entered and Authorized by:   Gordy Savers  MD   Signed by:   Gordy Savers  MD on 07/15/2008   Method used:   Print then Give to Patient   RxID:   [id number] ZETIA 10 MG  TABS (EZETIMIBE) 1 once daily  #90 x 6   Entered and Authorized by:   Gordy Savers  MD   Signed by:   Gordy Savers  MD on 07/15/2008   Method used:   Print then Give to Patient   RxID:   [id number] TOPROL XL 50 MG TB24 (METOPROLOL SUCCINATE) 1 tablet by mouth once a day  #90 x 6   Entered and Authorized by:   Gordy Savers  MD   Signed by:   Gordy Savers  MD on 07/15/2008   Method used:   Print then Give to Patient   RxID:   [id number] LIPITOR 80 MG TABS (ATORVASTATIN CALCIUM) Take 1 tablet by mouth once a day  #90 x 6   Entered and Authorized by:   Gordy Savers  MD   Signed by:   Gordy Savers  MD on 07/15/2008   Method used:   Print then Give to Patient   RxID:   [id number] COZAAR 50 MG TABS (LOSARTAN POTASSIUM) Take 1 tablet by mouth once a day  #90 x 6   Entered and Authorized by:   Gordy Savers  MD   Signed by:   Gordy Savers  MD on  07/15/2008   Method used:   Print then Give to Patient   RxID:   [id number]

## 2010-03-03 NOTE — Assessment & Plan Note (Signed)
Summary: rov/kfw  Medications Added ALEVE 220 MG TABS (NAPROXEN SODIUM) as needed        History of Present Illness: Mr. Buskey is a pleasant gentleman who has a history of coronary artery disease, status post coronary artery bypass graft surgery.  This was performed in June 2008. I last saw him in June of 2010. Since then he was seen by his primary care physician and noticed increased dyspnea on exertion, orthopnea and mild pedal edema. He was not having chest pain, palpitations or syncope.  A BNP was normal. He was placed on Lasix. Since then his symptoms have improved somewhat. He has mild dyspnea with more extreme activities but much improved compared to previous. It is relieved with rest. It is not associated with chest pain. His orthopnea has resolved. He still has mild pedal edema. He has not had chest pain or syncope.  Current Medications (verified): 1)  Aspirin 325 Mg Tabs (Aspirin) .... Take 1 Tablet By Mouth Once A Day 2)  Cozaar 100 Mg Tabs (Losartan Potassium) .... Take One Tablet By Mouth Daily 3)  Lipitor 80 Mg Tabs (Atorvastatin Calcium) .... Take 1 Tablet By Mouth Once A Day 4)  Toprol Xl 50 Mg Tb24 (Metoprolol Succinate) .Marland Kitchen.. 1 Tablet By Mouth Once A Day 5)  Zetia 10 Mg  Tabs (Ezetimibe) .Marland Kitchen.. 1 Once Daily 6)  Lorazepam 0.5 Mg  Tabs (Lorazepam) .... One Twice Daily As Needed For Anxiety or Sleep 7)  Furosemide 40 Mg Tabs (Furosemide) .... One Every Morning 8)  Aleve 220 Mg Tabs (Naproxen Sodium) .... As Needed  Allergies: No Known Drug Allergies  Past History:  Past Medical History: Current Problems:  HYPERLIPIDEMIA (ICD-272.4) CORONARY ARTERY DISEASE (ICD-414.00) MYOCARDIAL INFARCTION, HX OF (ICD-412) HYPERTENSION (ICD-401.9) ADJUSTMENT DISORDER WITH DEPRESSED MOOD (ICD-309.0) CELLULITIS, RIGHT LEG (ICD-682.6) NEPHROLITHIASIS, HX OF (ICD-V13.01)  Past Surgical History: Reviewed history from 07/15/2008 and no changes required. Coronary artery bypass graft June  2008  Social History: Reviewed history from 07/23/2008 and no changes required. has a girlfriend of 20 years duration Regular exercise-no Tobacco Use - No.  Alcohol Use - no  Review of Systems       no fevers or chills, productive cough, hemoptysis, dysphasia, odynophagia, melena, hematochezia, dysuria, hematuria, rash, seizure activity,  claudication. Remaining systems are negative.   Vital Signs:  Patient profile:   60 year old male Height:      66 inches Weight:      261 pounds BMI:     42.28 Pulse rate:   90 / minute Resp:     12 per minute BP sitting:   130 / 80  (left arm)  Vitals Entered By: Kem Parkinson (December 09, 2008 10:32 AM)  Physical Exam  General:  well-developed obese in no acute distress. Skin is warm and dry. Head:  HEENT is normal. Neck:  supple with no bruits. No thyromegaly. Lungs:  clear to auscultation Heart:  regular rate and rhythm Abdomen:  soft and nontender. No masses palpated. Extremities:  1+ edema. Neurologic:  grossly intact.   EKG  Procedure date:  12/09/2008  Findings:      Sinus rhythm at a rate of 90. Cannot rule out prior septal infarct. No ST changes.  Impression & Recommendations:  Problem # 1:  CONGESTIVE HEART FAILURE (ICD-428.0) The patient appears to have developed acute diastolic congestive heart failure. It has improved with Lasix. We will continue with present dose and check bmet. Will repeat echocardiogram to reassess LV function.  Note the patient is eating significant amounts of fast food. I've instructed him on a low-sodium diet. His updated medication list for this problem includes:    Aspirin 325 Mg Tabs (Aspirin) .Marland Kitchen... Take 1 tablet by mouth once a day    Cozaar 100 Mg Tabs (Losartan potassium) .Marland Kitchen... Take one tablet by mouth daily    Toprol Xl 50 Mg Tb24 (Metoprolol succinate) .Marland Kitchen... 1 tablet by mouth once a day    Furosemide 40 Mg Tabs (Furosemide) ..... One every morning  Problem # 2:  HYPERLIPIDEMIA  (ICD-272.4) Continue present medications. Lipids and liver monitored by his primary care. His updated medication list for this problem includes:    Lipitor 80 Mg Tabs (Atorvastatin calcium) .Marland Kitchen... Take 1 tablet by mouth once a day    Zetia 10 Mg Tabs (Ezetimibe) .Marland Kitchen... 1 once daily  Problem # 3:  CORONARY ARTERY DISEASE (ICD-414.00) Continue aspirin, beta blocker and statin. His updated medication list for this problem includes:    Aspirin 325 Mg Tabs (Aspirin) .Marland Kitchen... Take 1 tablet by mouth once a day    Toprol Xl 50 Mg Tb24 (Metoprolol succinate) .Marland Kitchen... 1 tablet by mouth once a day  Problem # 4:  HYPERTENSION (ICD-401.9) Blood pressure controlled on present medications. Will continue. His updated medication list for this problem includes:    Aspirin 325 Mg Tabs (Aspirin) .Marland Kitchen... Take 1 tablet by mouth once a day    Cozaar 100 Mg Tabs (Losartan potassium) .Marland Kitchen... Take one tablet by mouth daily    Toprol Xl 50 Mg Tb24 (Metoprolol succinate) .Marland Kitchen... 1 tablet by mouth once a day    Furosemide 40 Mg Tabs (Furosemide) ..... One every morning  Problem # 5:  OBESITY (ICD-278.00) We discussed the importance of weight loss.  Other Orders: TLB-BMP (Basic Metabolic Panel-BMET) (80048-METABOL) Echocardiogram (Echo)  Patient Instructions: 1)  Your physician recommends that you schedule a follow-up appointment in: 3 MONTHS 2)  Your physician recommends that you return for lab work TODAY 3)  Your physician has requested that you have an echocardiogram.  Echocardiography is a painless test that uses sound waves to create images of your heart. It provides your doctor with information about the size and shape of your heart and how well your heart's chambers and valves are working.  This procedure takes approximately one hour. There are no restrictions for this procedure.

## 2010-03-16 ENCOUNTER — Ambulatory Visit: Payer: Self-pay | Admitting: Internal Medicine

## 2010-06-16 NOTE — Assessment & Plan Note (Signed)
[facility]                            CARDIOLOGY OFFICE NOTE   [name], [name]                       MRN:          [id number]  DATE:10/10/2006                            DOB:          21-Aug-1950    SUBJECTIVE:  [name] is a pleasant gentleman who has a history of  coronary artery disease, status post coronary artery bypass graft  surgery.  This was performed in June 2008.  Since I last saw him he is  doing well.  He denies any dyspnea on exertion, orthopnea, PND, pedal  edema, palpitations, pre-syncope, syncope or chest pain.   CURRENT MEDICATIONS:  1. Toprol 50 mg p.o. daily.  2. Cozaar 50 mg p.o. daily.  3. Aspirin 325 mg p.o. daily.  4. Lipitor 40 mg p.o. daily.  5. Zetia 10 mg p.o. daily.   PHYSICAL EXAMINATION:  VITAL SIGNS:  Blood pressure 130/90, pulse 72,  weight 234 pounds.  HEENT:  Normal.  NECK:  Supple with no bruits.  CHEST:  Clear.  CARDIOVASCULAR:  Regular.  ABDOMEN:  Benign.  EXTREMITIES:  Show no edema.   His electrocardiogram shows a sinus rhythm at a rate of 72.  There is  left axis deviation.  There is a prior septal infarction.   DIAGNOSES:  1. Coronary artery disease, status post coronary artery bypass graft      surgery.  The patient has not had chest pain or shortness of      breath.  He will continue with his aspirin, beta blocker, ARB and      statin.  2. Risk factors:  I discussed the importance of diet and exercise      which he is not doing at present.  Note that he does not smoke.  3. Hypertension:  His blood pressure is mildly elevated today.  We      will increase his Toprol to 75 mg p.o. daily.  He will track this      at home and we will increase it further as needed.  4. Hyperlipidemia:  He will continue on his Lipitor and Zetia.  Note      that he had a cholesterol panel drawn in June.  His liver functions      were normal.  His LDL was 58 and his HDL was 27.  Note that he has      not  tolerated Niaspan in the past.  We will therefore continue with      his Lipitor and his Zetia.   FOLLOWUP:  We will see him back in 12 months or sooner if necessary.     Madolyn Frieze Jens Som, MD, [facility]  Electronically Signed    BSC/MedQ  DD: 10/10/2006  DT: 10/11/2006  Job #: [id number]

## 2010-06-19 NOTE — Discharge Summary (Signed)
NAME:  [name], [name] NO.:  [id number]   MEDICAL RECORD NO.:  [id number]          PATIENT TYPE:  INP   LOCATION:  2013                         FACILITY:  [facility]   PHYSICIAN:  Kerin Perna, M.D.  DATE OF BIRTH:  April 10, 1950   DATE OF ADMISSION:  07/24/2004  DATE OF DISCHARGE:                                 DISCHARGE SUMMARY   ADMISSION DIAGNOSIS:  ST segment myocardial infarction.   PAST MEDICAL HISTORY AND DISCHARGE DIAGNOSES:  1.  Hypertension.  2.  Nephrolithiasis.  3.  Obesity.  4.  Three-vessel coronary artery disease status post acute anterior      myocardial infarction, status post coronary artery bypass grafting x4.   ALLERGIES:  No known drug allergies.   BRIEF HISTORY:  The patient is a 60 year old Caucasian male with no cardiac  history who had an episode of sudden severe substernal chest pain the night  prior to admission, described as tightness throughout the precordium with  radiation to the left shoulder and arm.  The patient denied diaphoresis,  dyspnea, nausea and syncope.  He presented to the emergency room of [facility] in [place] where his cardiac enzymes were noted to be positive.  His  EKG showed nonspecific changes.  The patient was pain free and placed on a  beta blocker and Heparin.  He was then transferred to [facility]  for cardiac catheterization.   HOSPITAL COURSE:  The patient was ruled in in [place] for a non-ST segment  myocardial infarction and transferred to [facility] for cardiac  catheterization as previously stated.  The patient underwent cath on July 24, 2004 by Dr. Gerri Spore.  This revealed severe 3-vessel coronary artery  disease with an ejection fraction of 55%.  He had anterior apical  hypokinesis and trace mitral regurgitation.  Secondary to these findings,  Dr. Donata Clay of [facility] was consulted regarding surgical  revascularization.  Dr. Donata Clay evaluated the patient on July 24, 2004  and  it was his opinion that the patient should proceed with elective coronary  artery bypass graft surgery.  However, in light of the patient being  coagulopathic from being loaded with Plavix and Integralin he recommended 72  hours to recover from the myocardial infarction and to allow for the Plavix  and Integralin effects to resolve.  The patient was then subsequently  scheduled for elective surgery.   The patient was maintained on routine hospital care, remained in  satisfactory condition until he could be taken to surgery.  The patient was  taken to the OR on July 27, 2004 for coronary artery bypass grafting x4.  Saphenous vein harvesting was performed on the right lower extremity.  Left  internal mammary artery was grafted to the LAD, saphenous vein was grafted  to the OM, saphenous vein was grafted to the PDA, and saphenous vein was  grafted to the diagonal.  The patient tolerated the procedure well and was  hemodynamically stable immediately postoperatively.  The patient was  transferred from the OR to the SICU in stable condition.  The  patient was  extubated without complication and anesthetically and neurologically intact.   The patient's postoperative course proceeded as expected.  On postoperative  day 1, he was afebrile and maintaining a normal sinus rhythm.  Vital signs  were stable.  All basic lines and chest tubes were discontinued in a routine  manner.  All drips weaned accordingly without complications.  The patient  was volume overloaded and has been diuresed accordingly.  Diuresis will need  to continue after the patient's discharge short term.   He was initially started on an ACE inhibitor on postoperative day 2, however  he has been unable to tolerate this as he apparently has an ACE inhibitor  cough reaction.  This has been discontinued and the patient was started on  Cozaar.  The patient was in cardiac rehab on postoperative day 1 and has  tolerated this well.   He is currently ambulating well and at a satisfactory  level.  On postoperative day 3, he was noted to have a leukocytosis, however  this was trending down.  The patient has remained afebrile and there have  been no signs or symptoms of infection.  By postoperative day 3, the patient  was maintaining a normal sinus rhythm.  His vital signs were stable and he  had a low-grade temperature of 100.2.  His white count is continuing to  trend down.  On physical examination, the patient is without complaint.  Lungs revealed diminished breath sounds in the left base, otherwise clear.  Cardiac is regular rate and rhythm.  The abdomen is benign and there is  edema present in bilateral lower extremities.  The incision is healing well.  The patient is in stable condition at this time.  As long as he continues to  progress in current manner, he will be ready for discharge in the next 1 to  2 days, depending on morning round reevaluation.   LABORATORY DATA:  CBC and BNP on July 31, 2004:  White count 11.2,  hemoglobin 10, hematocrit 28, platelets 286.  Sodium 132, potassium 3.7, BUN  15, creatinine 0.8, glucose 101.   CONDITION ON DISCHARGE:  Improved.   INSTRUCTIONS:   MEDICATIONS:  1.  Aspirin 325 mg p.o. daily.  2.  Toprol XL 50 mg daily.  3.  Cozaar 15 mg daily.  4.  Lasix 40 mg daily x5 days.  5.  K-Dur 20 mEq daily x5 days.  6.  Tylox 1-2 q.4-6h p.r.n. pain.   ACTIVITY:  No driving, no lifting of more than 10 pounds and the patient  should continue daily breathing and walking exercises.   DIET:  Low salt, low fat.   WOUND CARE:  The patient may shower daily and clean the incisions with soap  and water.  If wound problems arise, the patient should contact the [facility]  office at [phone].   FOLLOW UP:  With Dr. Andee Lineman.  The patient should call his office for an appointment 2 weeks after discharge.  A chest x-ray will be taken at that  time which the patient will be instructed to bring  with him to the followup  appointment with Dr. Donata Clay on August 21, 2004 at 11:45 a.m.       AY/MEDQ  D:  07/31/2004  T:  07/31/2004  Job:  [id number]   cc:   Learta Codding, M.D. [facility]

## 2010-06-19 NOTE — Consult Note (Signed)
NAME:  [name], [name] NO.:  [id number]   MEDICAL RECORD NO.:  [id number]          PATIENT TYPE:  INP   LOCATION:  2927                         FACILITY:  [facility]   PHYSICIAN:  Kerin Perna, M.D.  DATE OF BIRTH:  [birth year]   DATE OF CONSULTATION:  07/24/2004  DATE OF DISCHARGE:                                   CONSULTATION   REASON FOR CONSULTATION:  Acute anterior MI with left main and three-vessel  disease, unstable angina.   PHYSICIAN REQUESTING CONSULTATION:  Carole Binning, M.D. [facility]   PRIMARY CARE PHYSICIAN:  Gordy Savers, M.D. [facility]   PRIMARY CARDIOLOGIST:  Learta Codding, M.D. [facility], [facility]  office.   CONSULTANT:  Allyn Kenner, M.D.   CHIEF COMPLAINT:  Chest pain.   HISTORY OF PRESENT ILLNESS:  I was asked to evaluate this 61 year old white  obese male for potential surgical coronary revascularization for recently  diagnosed severe coronary artery disease. The patient had an episode of  sudden severe substernal chest pain at 10 o'clock last night described as  tightness throughout the precordium with radiation to the left shoulder as  well as down the left arm. There was no diaphoresis, shortness of breath, or  nausea. The patient did not have syncope. He was brought by his family to  the emergency room at [facility] in [place]. His cardiac enzymes were  positive but EKG showed nonspecific changes. The patient was pain-free and  was placed on a beta-blocker and heparin and transferred to [facility] where he underwent cardiac catheterization today by Dr. Gerri Spore.  Prior to catheterization, he underwent Plavix load of 600 mg as well as  Integrilin load of 150 mcg intravenously. Cardiac catheterization  demonstrated a 70% left main stenosis with a 95-99% LAD, 60% circumflex, and  75% posterior descending. His ejection fraction was 55% and his left  ventricular end-diastolic pressure was 22 mmHg. He had  anterior apical  hypokinesia and trace mitral regurgitation. Based on his anatomy and  symptoms, he was felt to be a candidate for surgical revascularization.   PAST MEDICAL HISTORY:  1.  Hypertension.  2.  Kidney stones.  3.  Obesity.   HOME MEDICATIONS:  None.   ALLERGIES:  None.   SOCIAL HISTORY:  The patient is single and works for the [employer] as an Art gallery manager. He lives in the [place]. He does not smoke or  use alcohol.   FAMILY HISTORY:  His father died of congestive heart failure. No family  history of diabetes. Positive family history of hypertension.   REVIEW OF SYSTEMS:  GENERAL: He denies any constitutional symptoms of weight  loss or fever. He denies any history of thoracic trauma or abnormal chest x-  ray. HEENT: He denies difficulty swallowing or recent dental complaints.  CARDIAC: Negative for prior angina or abnormal EKG, or cardiac murmur. GI:  Negative for blood per rectum, jaundice, or hepatitis.  NEUROLOGIC: He is  positive for remote kidney stones, negative for BPH.  VASCULAR: Negative for  declaudication or TIA. ENDOCRINE:  Negative diabetes or thyroid disease.  NEUROLOGIC: Negative for stroke or seizure.   PHYSICAL EXAMINATION:  VITAL SIGNS: The patient is 5 feet 7 inches, weight  233 pounds, blood pressure is 112/78, pulse 80 and regular, respirations 18.  GENERAL APPEARANCE: A middle-aged white male in the CCU following cardiac  catheterization in no acute stress. HEENT: Normocephalic. Dentition good.  NECK: Without JVD, mass, or carotid bruit.  LYMPHATICS: No palpable supraclavicular or axillary adenopathy.  LUNGS: Clear to auscultation without thoracic deformity.  CARDIAC: Regular rhythm without S3, gallop, murmur, or friction rub.  ABDOMEN: Soft, nontender without pulsatile mass or organomegaly.  EXTREMITIES: Clubbing, cyanosis, or edema.  VASCULAR: Reveals 2+ pulses in all extremities without venous insufficiency  of the lower  extremities.  NEUROLOGIC:  Alert and oriented without focal deficit.   LABORATORY DATA:  Glucose is 114, BUN 14, creatinine 0.9, platelet count  315,000, and a troponin-1 of 21.3.   Chest x-ray here shows some mild interstitial edema pattern with mild  cardiomegaly.   I reviewed the coronary arteriograms with Dr. Gerri Spore and he has severe  two-vessel disease with high-grade stenosis of the LAD diagonal and moderate  left main right and circumflex disease. His EF is 55% and he has apical  hypokinesia.   IMPRESSION AND PLAN:  The patient is currently stable after a probable  completed anterior MI. He is coagulopathic from being loaded with Plavix and  Integrilin. I would recommend giving him the next 72 hours to recover from  his MI to allow for the Plavix effect to resolve and to schedule him for  elective surgical revascularization on Monday, 07/27/2004. I discussed this  plan with the patient and addressed all questions. Thank for the  consultation.       PV/MEDQ  D:  07/24/2004  T:  07/24/2004  Job:  [id number]

## 2010-06-19 NOTE — H&P (Signed)
NAME:  [name], [name] NO.:  [id number]   MEDICAL RECORD NO.:  [id number]          PATIENT TYPE:  INP   LOCATION:  2927                         FACILITY:  [facility]   PHYSICIAN:  Learta Codding, M.D. LHCDATE OF BIRTH:  [birth year]   DATE OF ADMISSION:  07/24/2004  DATE OF DISCHARGE:                                HISTORY & PHYSICAL   PRIMARY CARE PHYSICIAN:  Gordy Savers, M.D.   REASON FOR ADMISSION:  Sudden onset of severe substernal chest pain with  positive cardiac enzyme markers.   HISTORY OF PRESENT ILLNESS:  The patient is a 60 year old white male with  few cardiac risk factors.  It appears the patient does have a history of  hypertension.  The patient had been in his usual state of health until this  evening around 10 o'clock when he developed sudden onset of severe  substernal chest pain.  He described the pain as a tightness throughout the  precordium which seems to radiate in the left shoulder as well as down the  left arm.  There is no associated shortness of breath or diaphoresis.  The  patient states that he has had no recent exertional chest pressure.  He also  reports no palpitations or syncope.  The patient was brought by his brother  to the emergency room at [facility].  The patient lives in [place] but works  in [place].  On arrival in the ER, the first set of enzyme markers was  positive although the EKG was within normal limits.  The patient was pain-  free by this time.  He was then transferred to [facility] for  further evaluation and possible diagnostic cardiac catheterization.   PAST MEDICAL HISTORY:  As outlined above, history of hypertension.   FAMILY HISTORY:  The patient's father died of congestive heart failure, but  there is no other history in the siblings of coronary artery disease.   SOCIAL HISTORY:  The patient lives in [place] but works in [place] for  [employer].  He does not smoke or drink.  He denies  any drug use.   ALLERGIES:  No known drug allergies.   REVIEW OF SYSTEMS:  As per HPI.  The patient denies any nausea, vomiting or  abdominal pain.  No claudication, fever or chills.  No dysuria or frequency.  The remainder of the review of systems is essentially negative.   PHYSICAL EXAMINATION:  VITAL SIGNS:  Blood pressure is 118/49, heart rate is  75 beats per minute, respirations are 22, weight is 230 pounds.  GENERAL:  A well-nourished white male in no apparent distress.  HEENT:  Pupils reactive, conjunctivae clear.  NECK:  Supple, normal carotid upstroke, no carotid bruits.  LUNGS:  Clear breath sounds bilaterally.  CARDIAC:  Regular rate and rhythm, normal S1, S2.  No murmurs, rubs or  gallops.  PMI is nondisplaced.  ABDOMEN:  Soft, nontender, no rebound or guarding and no bruits.  Femoral  pulses are intact bilaterally at 2+ with no bruit.  EXTREMITIES:  No cyanosis, clubbing or edema.  Dorsalis pedal and  posterior  tibial are 2+ bilaterally.  NEUROLOGIC:  The patient is alert and oriented and exam is nonfocal.   LABORATORY DATA:  INR is 1.0.  Glucose 114, BUN is 14, creatinine is 0.9,  calcium is 8.9, potassium is 3.6.  CK is 362 with a troponin of 0.08.  White  count is 9.2, hemoglobin 16.3, platelet count is 315.  Chest x-ray report is  pending but negative from [place].  Twelve-lead electrocardiogram:  Normal  sinus rhythm with left axis deviation but otherwise no ischemic changes.   PROBLEMS:  1.  Unstable angina, rule out acute coronary syndrome.      1.  Negative electrocardiogram.      2.  Positive cardiac troponins.  2.  Hypertension, treated on enalapril.  3.  No prior history of coronary artery disease and few cardiac risk      factors, including absence of tobacco use and no family history of      coronary artery disease.  4.  Possible dyslipidemia.   PLAN:  1.  The patient's chest pain is consistent with an acute coronary syndrome,      and his first  cardiac enzyme markers are positive.  The plan is to      proceed with cardiac catheterization in the morning.  I discussed the      risks and benefits of this procedure with the patient, who is willing to      proceed.  2.  Aspirin, Plavix, heparin in the interim as an antiplatelet and      antithrombotic therapy.  3.  IV nitroglycerin and beta blocker for antianginal therapy.       GED/MEDQ  D:  07/24/2004  T:  07/24/2004  Job:  [id number]   cc:   Gordy Savers, M.D. [facility]

## 2010-06-19 NOTE — Op Note (Signed)
NAME:  ELIESER, TETRICK NO.:  [id number]   MEDICAL RECORD NO.:  [id number]          PATIENT TYPE:  INP   LOCATION:  2306                         FACILITY:  [facility]   PHYSICIAN:  Kerin Perna, M.D.  DATE OF BIRTH:  11-21-1950   DATE OF PROCEDURE:  DATE OF DISCHARGE:                                 OPERATIVE REPORT   OPERATIONS:  Coronary artery bypass grafting times four (left internal  mammary artery to LAD, saphenous vein graft to diagonal, saphenous vein  graft to obtuse marginal, saphenous vein graft to posterior descending).   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   SURGEON:  Kerin Perna, M.D.   ASSISTANT:  Gershon Crane PA-C   ANESTHESIA:  General.   INDICATIONS:  The patient is a 60 year old obese white male who presented  with acute onset of chest pain to an outside hospital. His cardiac enzymes  were positive and he underwent cardiac catheterization the next morning at  [facility]. This demonstrated a 99% stenosis of the LAD diagonal, 90%  stenosis of the poster descending  and 80% stenosis of the circumflex.  His  left main had a 40% stenosis.  He was felt to be candidate for surgical  revascularization. He was placed on Integrilin and Plavix.   Prior to surgery, I examined the patient in his CCU room and reviewed the  results of cardiac catheterization with the patient and his family. I  discussed the indications and expected benefits of coronary bypass surgery  for treatment of his coronary disease. I reviewed the alternatives to  surgical therapy as well. I reviewed with the patient major aspects of the  planned procedure including choice of conduit for grafts, the location of  the surgical incisions, use of general anesthesia and cardiopulmonary  bypass, and the expected postoperative hospital recovery. I reviewed with  the patient the  risks to him of coronary bypass surgery including risks of  MI, CVA, bleeding, infection, and death. He understood these implications  for the surgery and agreed to proceed with operation as planned under what I  felt was an informed consent.   OPERATIVE FINDINGS:  The patient's body habitus made exposure the posterior  aspect of the heart difficult. The mediastinum was short and the aorta was  foreshortened. Vein grafts were harvested endoscopically from the right leg  and were of adequate quality. The mammary artery was an adequate vessel with  good flow. The patient received platelets in the operating room for post  protamine coagulopathy and due to the fact he was on Integrilin for several  days prior surgery.   PROCEDURE:  The patient was brought to operative placed supine on the  operating table where general anesthesia was induced under invasive  hemodynamic monitoring. The chest, abdomen and legs were prepped with  Betadine and draped as a sterile field. A sternal incision was made at the  saphenous vein was harvested endoscopically from the right leg. The left  internal mammary artery was harvested  as a pedicle graft from its origin at  the subclavian vessels. Heparin was administered for the aprotinin protocol  used for this operation. The sternal retractor was placed in the pericardium  and was opened and suspended. Pursestrings placed in the ascending aorta and  right atrium and the patient was cannulated and placed on bypass. The heart  was examined. The anterior apical wall was edematous and thinned, consistent  with a subacute anterior MI. The coronaries were identified for grafting.  The mammary artery and vein grafts were prepared for the distal anastomoses.  Cardioplegia catheters were placed for both antegrade aortic and retrograde  coronary sinus cardioplegia. The patient was cooled to 32 degrees. Aortic  crossclamp was applied. 800 mL of cold blood cardioplegia was  delivered in  split doses between the antegrade aortic and retrograde coronary sinus  catheters. There was good cardioplegic arrest. Septal temperature dropped  less than 12 degrees.   The distal coronary anastomoses were performed. First distal anastomosis was  to the posterior descending. This had a proximal 90% stenosis.  A reverse  saphenous vein was sewn end-to-side with running 7-0 Prolene good flow  through graft. The second distal anastomosis was the diagonal. This is a 1.5  mm vessel with proximal 95% stenosis. A reverse saphenous vein was sewn end-  to-side with running 7-0 Prolene and good flow through the graft. The third  distal anastomosis was to the obtuse marginal. There is a 1.5 mm vessel with  proximal 70% stenosis. A reverse saphenous vein sewn end-to-side with  running 7-0 Prolene with good flow through graft. Cardioplegia was redosed.  The fourth distal anastomosis was to the distal third of the LAD. It had a  proximal calcified 99% stenosis. Left IMA pedicle was brought through an  opening created in the left lateral pericardium and was brought down on the  LAD and sewn end-to-side with running 8-0 Prolene. There is good flow  through the anastomosis after briefly flashing the pedicle bulldog on the  mammary pedicle. The pedicle was secured to the epicardium and cardioplegia  was reduced.   While the crossclamp was still in place, three proximal vein anastomoses  were placed in the ascending aorta using a 4.0 mm punch and running 6-0  Prolene. Prior to release of the crossclamp, air was evacuated from the  coronaries and left side of the heart using a dose of retrograde warm blood  cardioplegia and the usual de-airing maneuvers on bypass. The crossclamp was  removed and air was aspirated from the grafts with a 27 gauge needle. Bypass  grafts were examined and found to have good flow and hemostasis was documented of the proximal and distal anastomoses. The patient  was rewarmed  and reperfused. Temporary pacing wires were applied. The patient was then  weaned from bypass after the ventilator was resumed. He was on renal dose  dopamine and hemodynamics were stable. Protamine was administered without  adverse reaction. The cannulas were removed. The mediastinum was irrigated  with warm antibiotic irrigation. The leg incision was irrigated and closed a  standard fashion. The superior pericardial fat was closed over the aorta.  Two mediastinal and left pleural chest tube were placed and brought through  separate incisions. The sternum was closed with interrupted steel wire. The  pectoralis fascia was closed with a running #1 Vicryl. The subcutaneous and  skin layers were closed in running Vicryl and sterile dressings were  applied. Total bypass time was 120 minutes. The crossclamp time  was 70  minutes.       PV/MEDQ  D:  07/28/2004  T:  07/28/2004  Job:  [id number]   cc:   Learta Codding, M.D. [facility]

## 2010-06-19 NOTE — Cardiovascular Report (Signed)
NAME:  [name], [name] NO.:  [id number]   MEDICAL RECORD NO.:  [id number]          PATIENT TYPE:  INP   LOCATION:  2927                         FACILITY:  [facility]   PHYSICIAN:  Carole Binning, M.D. LHCDATE OF BIRTH:  1950/07/20   DATE OF PROCEDURE:  07/24/2004  DATE OF DISCHARGE:                              CARDIAC CATHETERIZATION   PROCEDURE PERFORMED:  1.  Left heart catheterization.  2.  Left coronary angiography.  3.  Left ventriculography.   INDICATIONS:  The patient is a 53-year male who presented late last evening  with chest pain.  He ruled in for a non-ST-segment-elevation myocardial  infarction.  His chest pain was relieved shortly after admission.  He was  referred this morning for cardiac catheterization.   PROCEDURAL NOTE:  A 6-French sheath was placed in the right femoral artery.  Coronary angiography was performed with standard Judkins 6-French catheters.  Left ventriculography was performed with an angled pigtail catheter.  Contrast was Omnipaque.  At the conclusion of the procedure, a 6-French  AngioSeal vascular closure device was placed in the right femoral artery  with good hemostasis.  There were no complications.   RESULTS:   HEMODYNAMICS:  Left ventricular pressure 112/22.  Aortic pressure 118/80.  There is no aortic valve gradient.   LEFT VENTRICULOGRAM:  There is mild akinesis of the apical wall.  Otherwise,  wall motion is normal.  Ejection fraction is calculated at 58%.  There is  trace mitral regurgitation.   CORONARY ARTERIOGRAPHY:  Left main has a distal 70% stenosis.   Left anterior descending artery has a 90% stenosis in the proximal vessel  followed by a long 99% stenosis in the mid-vessel with thrombus and TIMI-2  to -3 flow.  The LAD gives rise to normal-sized first diagonal and a small  second diagonal branch.  Left circumflex has a tubular 60% stenosis proximally.  The circumflex gives  rise to a small ramus  intermedius, a very small first obtuse marginal, a  large second obtuse marginal and a small third obtuse marginal branch.   Right coronary has a 50% stenosis in the proximal vessel with diffuse 30%  stenosis in the distal vessel.  The distal right coronary gives rise to  large posterior descending artery which has a tubular 75% stenosis at its  ostium.  There is a small first posterior branch, a large second  posterolateral branch and normal-sized third posterolateral branch arising  from the distal right coronary artery.   IMPRESSION:  1.  Left ventricular systolic function in the low range of normal with mild      akinesis of the apex.  2.  Three-vessel coronary artery disease characterized by significant left      main disease and critical stenosis in left anterior descending artery      with thrombus and TIMI-2 to -3 flow.   PLAN:  An AngioSeal has been placed.  The patient has been started on  Integrilin and heparin will be resumed.  Cardiovascular Surgery will be  consulted urgently for evaluation for coronary artery bypass surgery.  MWP/MEDQ  D:  07/24/2004  T:  07/24/2004  Job:  [id number]

## 2010-07-23 ENCOUNTER — Other Ambulatory Visit: Payer: Self-pay

## 2010-07-30 ENCOUNTER — Encounter: Payer: Self-pay | Admitting: Internal Medicine

## 2010-08-06 ENCOUNTER — Other Ambulatory Visit: Payer: Self-pay | Admitting: Dermatology

## 2010-09-07 ENCOUNTER — Other Ambulatory Visit: Payer: Self-pay | Admitting: Internal Medicine

## 2010-09-07 ENCOUNTER — Other Ambulatory Visit: Payer: Self-pay | Admitting: Dermatology

## 2010-09-07 NOTE — Telephone Encounter (Signed)
PT changed pharmacy and is requesting prescriptions refills sent to [facility] Lipitor 80Mg  Zetia 10mg  Cozaar 100Mg  Toprol XL 50Mg 

## 2010-09-22 ENCOUNTER — Other Ambulatory Visit (INDEPENDENT_AMBULATORY_CARE_PROVIDER_SITE_OTHER): Payer: 59

## 2010-09-22 DIAGNOSIS — Z0000 Encounter for general adult medical examination without abnormal findings: Secondary | ICD-10-CM

## 2010-09-22 LAB — POCT URINALYSIS DIPSTICK
Bilirubin, UA: NEGATIVE
Blood, UA: NEGATIVE
Glucose, UA: NEGATIVE
Ketones, UA: NEGATIVE
Leukocytes, UA: NEGATIVE
Nitrite, UA: NEGATIVE
Protein, UA: NEGATIVE
Spec Grav, UA: 1.025
Urobilinogen, UA: 1
pH, UA: 5.5

## 2010-09-22 LAB — CBC WITH DIFFERENTIAL/PLATELET
Basophils Absolute: 0 10*3/uL (ref 0.0–0.1)
Basophils Relative: 0.5 % (ref 0.0–3.0)
Eosinophils Absolute: 0.2 10*3/uL (ref 0.0–0.7)
Eosinophils Relative: 3.1 % (ref 0.0–5.0)
HCT: 42.7 % (ref 39.0–52.0)
Hemoglobin: 14.4 g/dL (ref 13.0–17.0)
Lymphocytes Relative: 31.9 % (ref 12.0–46.0)
Lymphs Abs: 2 10*3/uL (ref 0.7–4.0)
MCHC: 33.6 g/dL (ref 30.0–36.0)
MCV: 93.5 fl (ref 78.0–100.0)
Monocytes Absolute: 0.4 10*3/uL (ref 0.1–1.0)
Monocytes Relative: 7 % (ref 3.0–12.0)
Neutro Abs: 3.6 10*3/uL (ref 1.4–7.7)
Neutrophils Relative %: 57.5 % (ref 43.0–77.0)
Platelets: 135 10*3/uL — ABNORMAL LOW (ref 150.0–400.0)
RBC: 4.56 Mil/uL (ref 4.22–5.81)
RDW: 14.3 % (ref 11.5–14.6)
WBC: 6.3 10*3/uL (ref 4.5–10.5)

## 2010-09-22 LAB — HEPATIC FUNCTION PANEL
ALT: 33 U/L (ref 0–53)
AST: 39 U/L — ABNORMAL HIGH (ref 0–37)
Albumin: 4 g/dL (ref 3.5–5.2)
Alkaline Phosphatase: 71 U/L (ref 39–117)
Bilirubin, Direct: 0.2 mg/dL (ref 0.0–0.3)
Total Bilirubin: 0.8 mg/dL (ref 0.3–1.2)
Total Protein: 7.3 g/dL (ref 6.0–8.3)

## 2010-09-22 LAB — LIPID PANEL
Cholesterol: 101 mg/dL (ref 0–200)
HDL: 32.9 mg/dL — ABNORMAL LOW (ref >39.00)
LDL Cholesterol: 55 mg/dL (ref 0–99)
Total CHOL/HDL Ratio: 3
Triglycerides: 68 mg/dL (ref 0.0–149.0)
VLDL: 13.6 mg/dL (ref 0.0–40.0)

## 2010-09-22 LAB — BASIC METABOLIC PANEL
BUN: 15 mg/dL (ref 6–23)
CO2: 28 mEq/L (ref 19–32)
Calcium: 9.4 mg/dL (ref 8.4–10.5)
Chloride: 102 mEq/L (ref 96–112)
Creatinine, Ser: 0.8 mg/dL (ref 0.4–1.5)
GFR: 109.19 mL/min (ref >60.00)
Glucose, Bld: 108 mg/dL — ABNORMAL HIGH (ref 70–99)
Potassium: 4.6 mEq/L (ref 3.5–5.1)
Sodium: 137 mEq/L (ref 135–145)

## 2010-09-22 LAB — TSH: TSH: 3 u[IU]/mL (ref 0.35–5.50)

## 2010-09-22 LAB — PSA: PSA: 0.55 ng/mL (ref 0.10–4.00)

## 2010-09-28 ENCOUNTER — Encounter: Payer: Self-pay | Admitting: Internal Medicine

## 2010-09-29 ENCOUNTER — Ambulatory Visit (INDEPENDENT_AMBULATORY_CARE_PROVIDER_SITE_OTHER): Payer: 59 | Admitting: Internal Medicine

## 2010-09-29 ENCOUNTER — Encounter: Payer: Self-pay | Admitting: Internal Medicine

## 2010-09-29 VITALS — BP 100/70 | HR 68 | Temp 98.5°F | Resp 16 | Ht 65.0 in | Wt 246.0 lb

## 2010-09-29 DIAGNOSIS — Z0000 Encounter for general adult medical examination without abnormal findings: Secondary | ICD-10-CM

## 2010-09-29 DIAGNOSIS — E785 Hyperlipidemia, unspecified: Secondary | ICD-10-CM

## 2010-09-29 DIAGNOSIS — I10 Essential (primary) hypertension: Secondary | ICD-10-CM

## 2010-09-29 DIAGNOSIS — E669 Obesity, unspecified: Secondary | ICD-10-CM

## 2010-09-29 DIAGNOSIS — I252 Old myocardial infarction: Secondary | ICD-10-CM

## 2010-09-29 DIAGNOSIS — Z23 Encounter for immunization: Secondary | ICD-10-CM

## 2010-09-29 MED ORDER — METOPROLOL SUCCINATE ER 50 MG PO TB24: 50.0000 mg | ORAL_TABLET | Freq: Every day | ORAL | Status: DC

## 2010-09-29 MED ORDER — ATORVASTATIN CALCIUM 80 MG PO TABS: 80.0000 mg | ORAL_TABLET | Freq: Every day | ORAL | Status: DC

## 2010-09-29 MED ORDER — TAMSULOSIN HCL 0.4 MG PO CAPS: 0.4000 mg | ORAL_CAPSULE | Freq: Once | ORAL | Status: DC

## 2010-09-29 MED ORDER — VARDENAFIL HCL 20 MG PO TABS: 20.0000 mg | ORAL_TABLET | ORAL | Status: DC | PRN

## 2010-09-29 MED ORDER — NAPROXEN SODIUM 220 MG PO TABS: 220.0000 mg | ORAL_TABLET | ORAL | Status: DC | PRN

## 2010-09-29 MED ORDER — EZETIMIBE 10 MG PO TABS: 10.0000 mg | ORAL_TABLET | Freq: Every day | ORAL | Status: DC

## 2010-09-29 MED ORDER — LOSARTAN POTASSIUM 100 MG PO TABS: 100.0000 mg | ORAL_TABLET | Freq: Every day | ORAL | Status: DC

## 2010-09-29 NOTE — Patient Instructions (Signed)
You need to lose weight.  Consider a lower calorie diet and regular exercise.    It is important that you exercise regularly, at least 20 minutes 3 to 4 times per week.  If you develop chest pain or shortness of breath seek  medical attention.  Limit your sodium (Salt) intake  Return in 6 months for follow-up

## 2010-09-29 NOTE — Progress Notes (Signed)
Subjective:    Patient ID: [name], male    DOB: 08-Jan-1951, 60 y.o.   MRN: [id number]  HPI  59 year old patient who is seen today for a preventive health examination. He has a history of coronary artery disease and is status post CABG this has been quite stable he has a history of dyslipidemia and remains on Lipitor 80 mg daily he does describe some mild shoulder discomfort that he feels is manageable. He has a history of obesity but has more recently been adhering to a diet with some modest success. He has a history of nephrolithiasis which has been stable. He has treated hypertension  Wt Readings from Last 3 Encounters:  09/29/10 246 lb (111.585 kg)  08/11/09 244 lb (110.678 kg)  07/28/09 246 lb (111.585 kg)  Past Medical History:  Reviewed history from 12/09/2008 and no changes required.   Current Problems:  HYPERLIPIDEMIA (ICD-272.4)  CORONARY ARTERY DISEASE (ICD-414.00)  MYOCARDIAL INFARCTION, HX OF (ICD-412)  HYPERTENSION (ICD-401.9)  ADJUSTMENT DISORDER WITH DEPRESSED MOOD (ICD-309.0)  CELLULITIS, RIGHT LEG (ICD-682.6)  NEPHROLITHIASIS, HX OF (ICD-V13.01)   Past Surgical History:  Reviewed history from 04/01/2009 and no changes required.  Coronary artery bypass graft June 2006  colonoscopy September 2010   Family History:  Reviewed history from 10/14/2006 and no changes required.  father died age 72, history of heart failure, peptic ulcer disease  mother is 46 status post PCI hypertension, dyslipidemia  two brothers, one with hypertension one died a suicide death   Social History:  Reviewed history from 07/23/2008 and no changes required.  Single   Regular exercise-no  Tobacco Use - No.  Alcohol Use - no     Review of Systems  Constitutional: Negative for fever, chills, activity change, appetite change and fatigue.  HENT: Negative for hearing loss, ear pain, congestion, rhinorrhea, sneezing, mouth sores, trouble swallowing, neck pain, neck stiffness,  dental problem, voice change, sinus pressure and tinnitus.   Eyes: Negative for photophobia, pain, redness and visual disturbance.  Respiratory: Negative for apnea, cough, choking, chest tightness, shortness of breath and wheezing.   Cardiovascular: Negative for chest pain, palpitations and leg swelling.  Gastrointestinal: Negative for nausea, vomiting, abdominal pain, diarrhea, constipation, blood in stool, abdominal distention, anal bleeding and rectal pain.  Genitourinary: Positive for urgency, frequency and decreased urine volume. Negative for dysuria, hematuria, flank pain, discharge, penile swelling, scrotal swelling, difficulty urinating, genital sores and testicular pain.       Patient also complains of ED and wishes to consider drug therapy  Musculoskeletal: Negative for myalgias, back pain, joint swelling, arthralgias and gait problem.  Skin: Negative for color change, rash and wound.  Neurological: Negative for dizziness, tremors, seizures, syncope, facial asymmetry, speech difficulty, weakness, light-headedness, numbness and headaches.  Hematological: Negative for adenopathy. Does not bruise/bleed easily.  Psychiatric/Behavioral: Negative for suicidal ideas, hallucinations, behavioral problems, confusion, sleep disturbance, self-injury, dysphoric mood, decreased concentration and agitation. The patient is not nervous/anxious.        Objective:   Physical Exam  Constitutional: He appears well-developed and well-nourished.  HENT:  Head: Normocephalic and atraumatic.  Right Ear: External ear normal.  Left Ear: External ear normal.  Nose: Nose normal.  Mouth/Throat: Oropharynx is clear and moist.  Eyes: Conjunctivae and EOM are normal. Pupils are equal, round, and reactive to light. No scleral icterus.  Neck: Normal range of motion. Neck supple. No JVD present. No thyromegaly present.  Cardiovascular: Regular rhythm and normal heart sounds.  Exam reveals no gallop  and no friction  rub.   No murmur heard.      Diminished left dorsalis pedis pulse  Pulmonary/Chest: Effort normal and breath sounds normal. He exhibits no tenderness.  Abdominal: Soft. Bowel sounds are normal. He exhibits no distension and no mass. There is no tenderness.  Genitourinary: Penis normal. Guaiac negative stool.       Prostate +2 enlarged  Musculoskeletal: Normal range of motion. He exhibits no edema and no tenderness.  Lymphadenopathy:    He has no cervical adenopathy.  Neurological: He is alert. He has normal reflexes. No cranial nerve deficit. Coordination normal.  Skin: Skin is warm and dry. No rash noted.  Psychiatric: He has a normal mood and affect. His behavior is normal.          Assessment & Plan:   Preventative health examination Coronary artery disease. Stable followup cardiology as scheduled Hypertension. Blood pressure is well controlled and low normal today Dyslipidemia. Patient remains on Lipitor 80 mg daily has some mild joint discomfort which is tolerable we'll continue present regimen. We'll consider dose reduction if symptoms intensify Obesity.  weight loss encouraged as well as a regimented exercise program

## 2011-03-09 ENCOUNTER — Ambulatory Visit: Payer: 59 | Admitting: Cardiology

## 2011-03-30 ENCOUNTER — Ambulatory Visit: Payer: 59 | Admitting: Internal Medicine

## 2011-05-13 ENCOUNTER — Ambulatory Visit: Payer: 59 | Admitting: Internal Medicine

## 2011-05-13 ENCOUNTER — Ambulatory Visit: Payer: 59 | Admitting: Cardiology

## 2011-05-14 ENCOUNTER — Ambulatory Visit: Payer: 59 | Admitting: Cardiology

## 2011-05-14 ENCOUNTER — Ambulatory Visit: Payer: 59 | Admitting: Internal Medicine

## 2011-05-18 ENCOUNTER — Ambulatory Visit: Payer: 59 | Admitting: Internal Medicine

## 2011-07-26 ENCOUNTER — Ambulatory Visit: Payer: 59 | Admitting: Cardiology

## 2011-09-09 ENCOUNTER — Other Ambulatory Visit: Payer: 59

## 2011-09-16 ENCOUNTER — Encounter: Payer: 59 | Admitting: Internal Medicine

## 2011-09-20 ENCOUNTER — Ambulatory Visit: Payer: 59 | Admitting: Cardiology

## 2011-09-30 ENCOUNTER — Other Ambulatory Visit: Payer: Self-pay | Admitting: Internal Medicine

## 2011-10-14 ENCOUNTER — Other Ambulatory Visit: Payer: 59

## 2011-10-19 ENCOUNTER — Other Ambulatory Visit (INDEPENDENT_AMBULATORY_CARE_PROVIDER_SITE_OTHER): Payer: 59

## 2011-10-19 DIAGNOSIS — Z0000 Encounter for general adult medical examination without abnormal findings: Secondary | ICD-10-CM

## 2011-10-19 LAB — CBC WITH DIFFERENTIAL/PLATELET
Basophils Absolute: 0 10*3/uL (ref 0.0–0.1)
Basophils Relative: 0.4 % (ref 0.0–3.0)
Eosinophils Absolute: 0.1 10*3/uL (ref 0.0–0.7)
Eosinophils Relative: 2.2 % (ref 0.0–5.0)
HCT: 41 % (ref 39.0–52.0)
Hemoglobin: 13.3 g/dL (ref 13.0–17.0)
Lymphocytes Relative: 28 % (ref 12.0–46.0)
Lymphs Abs: 1.8 10*3/uL (ref 0.7–4.0)
MCHC: 32.4 g/dL (ref 30.0–36.0)
MCV: 95.1 fl (ref 78.0–100.0)
Monocytes Absolute: 0.4 10*3/uL (ref 0.1–1.0)
Monocytes Relative: 6.5 % (ref 3.0–12.0)
Neutro Abs: 4.1 10*3/uL (ref 1.4–7.7)
Neutrophils Relative %: 62.9 % (ref 43.0–77.0)
Platelets: 120 10*3/uL — ABNORMAL LOW (ref 150.0–400.0)
RBC: 4.31 Mil/uL (ref 4.22–5.81)
RDW: 14.8 % — ABNORMAL HIGH (ref 11.5–14.6)
WBC: 6.6 10*3/uL (ref 4.5–10.5)

## 2011-10-19 LAB — POCT URINALYSIS DIPSTICK
Bilirubin, UA: NEGATIVE
Blood, UA: NEGATIVE
Glucose, UA: NEGATIVE
Ketones, UA: NEGATIVE
Leukocytes, UA: NEGATIVE
Nitrite, UA: NEGATIVE
Protein, UA: NEGATIVE
Spec Grav, UA: 1.02
Urobilinogen, UA: 2
pH, UA: 6

## 2011-10-19 LAB — PSA: PSA: 0.34 ng/mL (ref 0.10–4.00)

## 2011-10-19 LAB — BASIC METABOLIC PANEL
BUN: 16 mg/dL (ref 6–23)
CO2: 27 mEq/L (ref 19–32)
Calcium: 8.6 mg/dL (ref 8.4–10.5)
Chloride: 98 mEq/L (ref 96–112)
Creatinine, Ser: 0.9 mg/dL (ref 0.4–1.5)
GFR: 87.5 mL/min (ref >60.00)
Glucose, Bld: 118 mg/dL — ABNORMAL HIGH (ref 70–99)
Potassium: 4.4 mEq/L (ref 3.5–5.1)
Sodium: 139 mEq/L (ref 135–145)

## 2011-10-19 LAB — LIPID PANEL
Cholesterol: 116 mg/dL (ref 0–200)
HDL: 38.3 mg/dL — ABNORMAL LOW (ref >39.00)
LDL Cholesterol: 64 mg/dL (ref 0–99)
Total CHOL/HDL Ratio: 3
Triglycerides: 68 mg/dL (ref 0.0–149.0)
VLDL: 13.6 mg/dL (ref 0.0–40.0)

## 2011-10-19 LAB — HEPATIC FUNCTION PANEL
ALT: 37 U/L (ref 0–53)
AST: 41 U/L — ABNORMAL HIGH (ref 0–37)
Albumin: 3.9 g/dL (ref 3.5–5.2)
Alkaline Phosphatase: 59 U/L (ref 39–117)
Bilirubin, Direct: 0.2 mg/dL (ref 0.0–0.3)
Total Bilirubin: 1 mg/dL (ref 0.3–1.2)
Total Protein: 6.9 g/dL (ref 6.0–8.3)

## 2011-10-19 LAB — TSH: TSH: 2.56 u[IU]/mL (ref 0.35–5.50)

## 2011-10-26 ENCOUNTER — Ambulatory Visit (INDEPENDENT_AMBULATORY_CARE_PROVIDER_SITE_OTHER): Payer: 59 | Admitting: Internal Medicine

## 2011-10-26 ENCOUNTER — Encounter: Payer: Self-pay | Admitting: Internal Medicine

## 2011-10-26 VITALS — BP 100/60 | HR 70 | Temp 97.9°F | Resp 18 | Ht 65.0 in | Wt 259.0 lb

## 2011-10-26 DIAGNOSIS — I2510 Atherosclerotic heart disease of native coronary artery without angina pectoris: Secondary | ICD-10-CM

## 2011-10-26 DIAGNOSIS — Z0000 Encounter for general adult medical examination without abnormal findings: Secondary | ICD-10-CM

## 2011-10-26 DIAGNOSIS — I10 Essential (primary) hypertension: Secondary | ICD-10-CM

## 2011-10-26 DIAGNOSIS — Z23 Encounter for immunization: Secondary | ICD-10-CM

## 2011-10-26 MED ORDER — TAMSULOSIN HCL 0.4 MG PO CAPS: 0.4000 mg | ORAL_CAPSULE | Freq: Every day | ORAL | Status: DC

## 2011-10-26 MED ORDER — ATORVASTATIN CALCIUM 80 MG PO TABS: 80.0000 mg | ORAL_TABLET | Freq: Every day | ORAL | Status: DC

## 2011-10-26 MED ORDER — TADALAFIL 20 MG PO TABS: 20.0000 mg | ORAL_TABLET | Freq: Every day | ORAL | Status: DC | PRN

## 2011-10-26 MED ORDER — METOPROLOL SUCCINATE ER 50 MG PO TB24: 50.0000 mg | ORAL_TABLET | Freq: Every day | ORAL | Status: DC

## 2011-10-26 MED ORDER — LOSARTAN POTASSIUM 100 MG PO TABS: 100.0000 mg | ORAL_TABLET | Freq: Every day | ORAL | Status: DC

## 2011-10-26 MED ORDER — EZETIMIBE 10 MG PO TABS: 10.0000 mg | ORAL_TABLET | Freq: Every day | ORAL | Status: DC

## 2011-10-26 NOTE — Patient Instructions (Signed)
You need to lose weight.  Consider a lower calorie diet and regular exercise.    It is important that you exercise regularly, at least 20 minutes 3 to 4 times per week.  If you develop chest pain or shortness of breath seek  medical attention.  Limit your sodium (Salt) intake  Cardiology followup as planned  Return in one year for follow-up

## 2011-10-26 NOTE — Progress Notes (Signed)
Subjective:    Patient ID: [name], male    DOB: Dec 22, 1950, 61 y.o.   MRN: [id number]  HPI    Review of Systems     Objective:   Physical Exam        Assessment & Plan:   Subjective:    Patient ID: [name], male    DOB: 04/14/1950, 61 y.o.   MRN: [id number]  HPI  61 year old patient who is seen today for a preventive health examination. He has a history of coronary artery disease and is status post CABG this has been quite stable he has a history of dyslipidemia and remains on Lipitor 80 mg daily he does describe some mild shoulder discomfort that he feels is manageable. He has a history of obesity but has more recently been adhering to a diet with some modest success. He has a history of nephrolithiasis which has been stable. He has treated hypertension;  he was treated for acute renal colic on 07/02/2011 symptomatically and he eventually passed a stone.  His only complaint is ED refractory to Levitra  Wt Readings from Last 3 Encounters:  09/29/10 246 lb (111.585 kg)  08/11/09 244 lb (110.678 kg)  07/28/09 246 lb (111.585 kg)  Past Medical History:  Reviewed history from 12/09/2008 and no changes required.   Current Problems:  HYPERLIPIDEMIA (ICD-272.4)  CORONARY ARTERY DISEASE (ICD-414.00)  MYOCARDIAL INFARCTION, HX OF (ICD-412)  HYPERTENSION (ICD-401.9)  ADJUSTMENT DISORDER WITH DEPRESSED MOOD (ICD-309.0)  CELLULITIS, RIGHT LEG (ICD-682.6)  NEPHROLITHIASIS, HX OF (ICD-V13.01)   Past Surgical History:  Reviewed history from 04/01/2009 and no changes required.  Coronary artery bypass graft June 2006  colonoscopy September 2010   Family History:  Reviewed history from 10/14/2006 and no changes required.  father died age 74, history of heart failure, peptic ulcer disease  mother is 48 status post PCI hypertension, dyslipidemia  two brothers, one with hypertension one died a suicide death   Social History:  Reviewed history from 07/23/2008 and no  changes required.  Single   Regular exercise-no  Tobacco Use - No.  Alcohol Use - no     Review of Systems  Constitutional: Negative for fever, chills, activity change, appetite change and fatigue.  HENT: Negative for hearing loss, ear pain, congestion, rhinorrhea, sneezing, mouth sores, trouble swallowing, neck pain, neck stiffness, dental problem, voice change, sinus pressure and tinnitus.   Eyes: Negative for photophobia, pain, redness and visual disturbance.  Respiratory: Negative for apnea, cough, choking, chest tightness, shortness of breath and wheezing.   Cardiovascular: Negative for chest pain, palpitations and leg swelling.  Gastrointestinal: Negative for nausea, vomiting, abdominal pain, diarrhea, constipation, blood in stool, abdominal distention, anal bleeding and rectal pain.  Genitourinary: Positive for urgency, frequency and decreased urine volume. Negative for dysuria, hematuria, flank pain, discharge, penile swelling, scrotal swelling, difficulty urinating, genital sores and testicular pain.       Patient also complains of ED and wishes to consider drug therapy  Musculoskeletal: Negative for myalgias, back pain, joint swelling, arthralgias and gait problem.  Skin: Negative for color change, rash and wound.  Neurological: Negative for dizziness, tremors, seizures, syncope, facial asymmetry, speech difficulty, weakness, light-headedness, numbness and headaches.  Hematological: Negative for adenopathy. Does not bruise/bleed easily.  Psychiatric/Behavioral: Negative for suicidal ideas, hallucinations, behavioral problems, confusion, sleep disturbance, self-injury, dysphoric mood, decreased concentration and agitation. The patient is not nervous/anxious.        Objective:   Physical Exam  Constitutional: He appears well-developed and  well-nourished.  HENT:  Head: Normocephalic and atraumatic.  Right Ear: External ear normal.  Left Ear: External ear normal.  Nose: Nose  normal.  Mouth/Throat: Oropharynx is clear and moist.  Eyes: Conjunctivae and EOM are normal. Pupils are equal, round, and reactive to light. No scleral icterus.  Neck: Normal range of motion. Neck supple. No JVD present. No thyromegaly present.  Cardiovascular: Regular rhythm and normal heart sounds.  Exam reveals no gallop and no friction rub.   No murmur heard.      Diminished left dorsalis pedis pulse  Pulmonary/Chest: Effort normal and breath sounds normal. He exhibits no tenderness.  Abdominal: Soft. Bowel sounds are normal. He exhibits no distension and no mass. There is no tenderness.  Genitourinary: Penis normal. Guaiac negative stool.       Prostate +2 enlarged  Musculoskeletal: Normal range of motion. He exhibits no edema and no tenderness.  Lymphadenopathy:    He has no cervical adenopathy.  Neurological: He is alert. He has normal reflexes. No cranial nerve deficit. Coordination normal.  Skin: Skin is warm and dry. No rash noted.  Psychiatric: He has a normal mood and affect. His behavior is normal.          Assessment & Plan:   Preventative health examination Coronary artery disease. Stable followup cardiology as scheduled Hypertension. Blood pressure is well controlled and low normal today Dyslipidemia. Patient remains on Lipitor 80 mg daily has some mild joint discomfort which is tolerable we'll continue present regimen. We'll consider dose reduction if symptoms intensify Obesity.  weight loss encouraged as well as a regimented exercise program

## 2011-11-05 ENCOUNTER — Ambulatory Visit: Payer: 59 | Admitting: Cardiology

## 2012-01-06 ENCOUNTER — Ambulatory Visit: Payer: 59 | Admitting: Cardiology

## 2012-01-12 ENCOUNTER — Other Ambulatory Visit: Payer: Self-pay | Admitting: Dermatology

## 2012-02-18 ENCOUNTER — Ambulatory Visit: Payer: 59 | Admitting: Cardiology

## 2012-02-21 ENCOUNTER — Ambulatory Visit: Payer: 59 | Admitting: Internal Medicine

## 2012-02-21 DIAGNOSIS — Z0289 Encounter for other administrative examinations: Secondary | ICD-10-CM

## 2012-02-25 ENCOUNTER — Ambulatory Visit: Payer: 59 | Admitting: Internal Medicine

## 2012-04-25 ENCOUNTER — Ambulatory Visit: Payer: 59 | Admitting: Cardiology

## 2012-07-21 ENCOUNTER — Ambulatory Visit: Payer: 59 | Admitting: Cardiology

## 2012-10-04 ENCOUNTER — Ambulatory Visit: Payer: 59 | Admitting: Cardiology

## 2012-10-20 ENCOUNTER — Other Ambulatory Visit: Payer: 59

## 2012-10-27 ENCOUNTER — Encounter: Payer: 59 | Admitting: Internal Medicine

## 2012-11-07 ENCOUNTER — Ambulatory Visit: Payer: 59 | Admitting: Cardiology

## 2012-11-21 ENCOUNTER — Other Ambulatory Visit: Payer: 59

## 2012-11-27 ENCOUNTER — Encounter: Payer: 59 | Admitting: Internal Medicine

## 2012-12-18 ENCOUNTER — Other Ambulatory Visit: Payer: Self-pay | Admitting: Internal Medicine

## 2013-01-15 ENCOUNTER — Ambulatory Visit: Payer: 59 | Admitting: Cardiology

## 2013-01-29 ENCOUNTER — Other Ambulatory Visit: Payer: 59

## 2013-02-05 ENCOUNTER — Encounter: Payer: 59 | Admitting: Internal Medicine

## 2013-02-20 ENCOUNTER — Ambulatory Visit: Payer: 59 | Admitting: Cardiology

## 2013-02-28 ENCOUNTER — Other Ambulatory Visit: Payer: 59

## 2013-03-06 ENCOUNTER — Encounter: Payer: 59 | Admitting: Internal Medicine

## 2013-04-06 ENCOUNTER — Other Ambulatory Visit: Payer: Self-pay | Admitting: Internal Medicine

## 2013-04-10 ENCOUNTER — Other Ambulatory Visit: Payer: 59

## 2013-04-17 ENCOUNTER — Encounter: Payer: 59 | Admitting: Internal Medicine

## 2013-04-20 ENCOUNTER — Encounter: Payer: 59 | Admitting: Cardiology

## 2013-06-26 ENCOUNTER — Encounter: Payer: 59 | Admitting: Cardiology

## 2013-06-26 ENCOUNTER — Other Ambulatory Visit: Payer: 59

## 2013-07-02 ENCOUNTER — Encounter: Payer: 59 | Admitting: Internal Medicine

## 2013-08-13 ENCOUNTER — Other Ambulatory Visit: Payer: Self-pay | Admitting: Internal Medicine

## 2013-08-15 ENCOUNTER — Telehealth: Payer: Self-pay | Admitting: Internal Medicine

## 2013-08-15 NOTE — Telephone Encounter (Signed)
Pt is needing 1 more refill on his rx metoprolol succinate (TOPROL-XL) 50 MG 24 hr tablet, losartan (cozaar) 100 mg, zetia 10 mg, tamsulosin (flomax) 0.4 mg, and atorvastatin  (lipitor) 80 mg  States he has appt on 8/20 for his physical. Send to [facility]

## 2013-08-16 MED ORDER — ATORVASTATIN CALCIUM 80 MG PO TABS: ORAL_TABLET | ORAL | Status: DC

## 2013-08-16 MED ORDER — LOSARTAN POTASSIUM 100 MG PO TABS: ORAL_TABLET | ORAL | Status: DC

## 2013-08-16 MED ORDER — METOPROLOL SUCCINATE ER 50 MG PO TB24: ORAL_TABLET | ORAL | Status: DC

## 2013-08-16 MED ORDER — TAMSULOSIN HCL 0.4 MG PO CAPS: ORAL_CAPSULE | ORAL | Status: DC

## 2013-08-16 MED ORDER — EZETIMIBE 10 MG PO TABS: ORAL_TABLET | ORAL | Status: DC

## 2013-08-16 NOTE — Telephone Encounter (Signed)
Spoke to pt told him Rx's refilled but must keep appointment on 8/20 will not be able to refill anymore till seen. Pt verbalized understanding.

## 2013-08-28 ENCOUNTER — Ambulatory Visit (INDEPENDENT_AMBULATORY_CARE_PROVIDER_SITE_OTHER): Payer: 59 | Admitting: Cardiology

## 2013-08-28 ENCOUNTER — Encounter: Payer: Self-pay | Admitting: Cardiology

## 2013-08-28 ENCOUNTER — Encounter: Payer: Self-pay | Admitting: *Deleted

## 2013-08-28 VITALS — BP 136/70 | HR 81 | Ht 66.0 in | Wt 257.0 lb

## 2013-08-28 DIAGNOSIS — I10 Essential (primary) hypertension: Secondary | ICD-10-CM

## 2013-08-28 DIAGNOSIS — R0989 Other specified symptoms and signs involving the circulatory and respiratory systems: Secondary | ICD-10-CM

## 2013-08-28 DIAGNOSIS — I2510 Atherosclerotic heart disease of native coronary artery without angina pectoris: Secondary | ICD-10-CM

## 2013-08-28 DIAGNOSIS — E785 Hyperlipidemia, unspecified: Secondary | ICD-10-CM

## 2013-08-28 NOTE — Assessment & Plan Note (Signed)
Continue statin. Lipids and liver monitored by primary care. 

## 2013-08-28 NOTE — Assessment & Plan Note (Signed)
Continue aspirin and statin. It has been almost 10 years since his previous bypass. Schedule nuclear study for risk stratification.

## 2013-08-28 NOTE — Progress Notes (Signed)
     HPI:  Evaluation of CAD; history of coronary artery disease, status post coronary artery bypass graft surgery. This was performed in June 2006. Patient had a LIMA to the LAD, saphenous vein graft to the diagonal, saphenous vein graft to the acute marginal and saphenous vein graft to the PDA. Echocardiogram in November of 2010 revealed an ejection fraction of 50-55%, moderate left atrial enlargement, and mild right atrial enlargement. Patient has been seen in this office but not since July of 2011. Patient denies dyspnea, chest pain, palpitations, syncope or claudication.   Current Outpatient Prescriptions  Medication Sig Dispense Refill  . aspirin 325 MG tablet Take 325 mg by mouth daily.        Marland Kitchen atorvastatin (LIPITOR) 80 MG tablet TAKE 1 TABLET BY MOUTH EVERY DAY  90 tablet  0  . ezetimibe (ZETIA) 10 MG tablet TAKE 1 TABLET BY MOUTH EVERY DAY  90 tablet  0  . losartan (COZAAR) 100 MG tablet TAKE 1 TABLET BY MOUTH EVERY DAY  90 tablet  0  . metoprolol succinate (TOPROL-XL) 50 MG 24 hr tablet TAKE 1 TABLET BY MOUTH EVERY DAY  90 tablet  0  . tamsulosin (FLOMAX) 0.4 MG CAPS capsule TAKE ONE CAPSULE BY MOUTH EVERY DAY AFTER SUPPER  90 capsule  0   No current facility-administered medications for this visit.    No Known Allergies  Past Medical History  Diagnosis Date  . Adjustment disorder with depressed mood 09/07/2007  . CONGESTIVE HEART FAILURE 12/09/2008  . CORONARY ARTERY DISEASE 08/09/2006  . HYPERLIPIDEMIA 08/09/2006  . HYPERTENSION 08/09/2006  . MYOCARDIAL INFARCTION, HX OF 08/09/2006  . 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 OBESITY 07/19/2008    Past Surgical History  Procedure Laterality Date  . Coronary artery bypass graft      History   Social History  . Marital Status: Single    Spouse Name: N/A    Number of Children: N/A  . Years of Education: N/A   Occupational History  . Not on file.   Social History Main Topics  . Smoking status: Never Smoker   . Smokeless  tobacco: Never Used  . Alcohol Use: No  . Drug Use: No  . Sexual Activity: Not on file   Other Topics Concern  . Not on file   Social History Narrative  . No narrative on file    Family History  Problem Relation Age of Onset  . Hypertension Mother   . Hyperlipidemia Mother   . Heart disease Father   . Ulcers Father     ROS: no fevers or chills, productive cough, hemoptysis, dysphasia, odynophagia, melena, hematochezia, dysuria, hematuria, rash, seizure activity, orthopnea, PND, pedal edema, claudication. Remaining systems are negative.  Physical Exam:   Blood pressure 136/70, pulse 81, height 5\' 6"  (1.676 m), weight 257 lb (116.574 kg).  General:  Well developed/obese in NAD Skin warm/dry Patient not depressed No peripheral clubbing Back-normal HEENT-normal/normal eyelids Neck supple/normal carotid upstroke bilaterally; Left carotid bruit; no JVD; no thyromegaly chest - CTA/ normal expansion CV - RRR/normal S1 and S2; no murmurs, rubs or gallops;  PMI nondisplaced Abdomen -NT/ND, no HSM, no mass, + bowel sounds, no bruit 2+ femoral pulses, no bruits Ext-no edema, chords, 2+ DP Neuro-grossly nonfocal  ECG Sinus rhythm, first degree AV block, LVH

## 2013-08-28 NOTE — Patient Instructions (Signed)
Your physician wants you to follow-up in: [place] will receive a reminder letter in the mail two months in advance. If you don't receive a letter, please call our office to schedule the follow-up appointment.   Your physician has requested that you have en exercise stress myoview. For further information please visit [email or web address]. Please follow instruction sheet, as given.   Your physician has requested that you have a carotid duplex. This test is an ultrasound of the carotid arteries in your neck. It looks at blood flow through these arteries that supply the brain with blood. Allow one hour for this exam. There are no restrictions or special instructions.

## 2013-08-28 NOTE — Assessment & Plan Note (Signed)
Schedule carotid Dopplers for left carotid bruit.

## 2013-08-28 NOTE — Assessment & Plan Note (Signed)
Blood pressure controlled. Continue present medications. Potassium and renal function monitored by primary care. 

## 2013-09-06 ENCOUNTER — Telehealth (HOSPITAL_COMMUNITY): Payer: Self-pay

## 2013-09-06 ENCOUNTER — Encounter: Payer: Self-pay | Admitting: *Deleted

## 2013-09-06 NOTE — Telephone Encounter (Signed)
Encounter complete. 

## 2013-09-11 ENCOUNTER — Inpatient Hospital Stay (HOSPITAL_COMMUNITY): Admission: RE | Admit: 2013-09-11 | Payer: 59 | Source: Ambulatory Visit

## 2013-09-11 ENCOUNTER — Encounter (HOSPITAL_COMMUNITY): Payer: 59

## 2013-09-13 ENCOUNTER — Other Ambulatory Visit (INDEPENDENT_AMBULATORY_CARE_PROVIDER_SITE_OTHER): Payer: 59

## 2013-09-13 DIAGNOSIS — Z0000 Encounter for general adult medical examination without abnormal findings: Secondary | ICD-10-CM

## 2013-09-13 LAB — CBC WITH DIFFERENTIAL/PLATELET
Basophils Absolute: 0 10*3/uL (ref 0.0–0.1)
Basophils Relative: 0.5 % (ref 0.0–3.0)
Eosinophils Absolute: 0.2 10*3/uL (ref 0.0–0.7)
Eosinophils Relative: 2.5 % (ref 0.0–5.0)
HCT: 42.1 % (ref 39.0–52.0)
Hemoglobin: 14.2 g/dL (ref 13.0–17.0)
Lymphocytes Relative: 32.9 % (ref 12.0–46.0)
Lymphs Abs: 2 10*3/uL (ref 0.7–4.0)
MCHC: 33.8 g/dL (ref 30.0–36.0)
MCV: 91.2 fl (ref 78.0–100.0)
Monocytes Absolute: 0.5 10*3/uL (ref 0.1–1.0)
Monocytes Relative: 7.7 % (ref 3.0–12.0)
Neutro Abs: 3.5 10*3/uL (ref 1.4–7.7)
Neutrophils Relative %: 56.4 % (ref 43.0–77.0)
Platelets: 110 10*3/uL — ABNORMAL LOW (ref 150.0–400.0)
RBC: 4.62 Mil/uL (ref 4.22–5.81)
RDW: 15.4 % (ref 11.5–15.5)
WBC: 6.2 10*3/uL (ref 4.0–10.5)

## 2013-09-13 LAB — BASIC METABOLIC PANEL
BUN: 15 mg/dL (ref 6–23)
CO2: 28 mEq/L (ref 19–32)
Calcium: 9.3 mg/dL (ref 8.4–10.5)
Chloride: 105 mEq/L (ref 96–112)
Creatinine, Ser: 0.8 mg/dL (ref 0.4–1.5)
GFR: 108.14 mL/min (ref >60.00)
Glucose, Bld: 127 mg/dL — ABNORMAL HIGH (ref 70–99)
Potassium: 5.1 mEq/L (ref 3.5–5.1)
Sodium: 138 mEq/L (ref 135–145)

## 2013-09-13 LAB — LIPID PANEL
Cholesterol: 104 mg/dL (ref 0–200)
HDL: 32.5 mg/dL — ABNORMAL LOW (ref >39.00)
LDL Cholesterol: 51 mg/dL (ref 0–99)
NonHDL: 71.5
Total CHOL/HDL Ratio: 3
Triglycerides: 105 mg/dL (ref 0.0–149.0)
VLDL: 21 mg/dL (ref 0.0–40.0)

## 2013-09-13 LAB — HEPATIC FUNCTION PANEL
ALT: 28 U/L (ref 0–53)
AST: 36 U/L (ref 0–37)
Albumin: 3.5 g/dL (ref 3.5–5.2)
Alkaline Phosphatase: 67 U/L (ref 39–117)
Bilirubin, Direct: 0.2 mg/dL (ref 0.0–0.3)
Total Bilirubin: 1.1 mg/dL (ref 0.2–1.2)
Total Protein: 6.8 g/dL (ref 6.0–8.3)

## 2013-09-13 LAB — POCT URINALYSIS DIPSTICK
Bilirubin, UA: NEGATIVE
Blood, UA: NEGATIVE
Glucose, UA: NEGATIVE
Ketones, UA: NEGATIVE
Leukocytes, UA: NEGATIVE
Nitrite, UA: NEGATIVE
Protein, UA: NEGATIVE
Spec Grav, UA: 1.02
Urobilinogen, UA: 0.2
pH, UA: 5.5

## 2013-09-13 LAB — PSA: PSA: 0.29 ng/mL (ref 0.10–4.00)

## 2013-09-13 LAB — TSH: TSH: 2.52 u[IU]/mL (ref 0.35–4.50)

## 2013-09-14 ENCOUNTER — Telehealth (HOSPITAL_COMMUNITY): Payer: Self-pay | Admitting: *Deleted

## 2013-09-20 ENCOUNTER — Encounter: Payer: Self-pay | Admitting: Gastroenterology

## 2013-09-20 ENCOUNTER — Encounter: Payer: Self-pay | Admitting: Internal Medicine

## 2013-09-20 ENCOUNTER — Ambulatory Visit (INDEPENDENT_AMBULATORY_CARE_PROVIDER_SITE_OTHER): Payer: 59 | Admitting: Internal Medicine

## 2013-09-20 VITALS — BP 110/64 | HR 74 | Temp 98.0°F | Resp 20 | Ht 65.0 in | Wt 257.0 lb

## 2013-09-20 DIAGNOSIS — R7302 Impaired glucose tolerance (oral): Secondary | ICD-10-CM | POA: Insufficient documentation

## 2013-09-20 DIAGNOSIS — Z0000 Encounter for general adult medical examination without abnormal findings: Secondary | ICD-10-CM

## 2013-09-20 DIAGNOSIS — D696 Thrombocytopenia, unspecified: Secondary | ICD-10-CM | POA: Insufficient documentation

## 2013-09-20 DIAGNOSIS — R7309 Other abnormal glucose: Secondary | ICD-10-CM

## 2013-09-20 DIAGNOSIS — E669 Obesity, unspecified: Secondary | ICD-10-CM

## 2013-09-20 DIAGNOSIS — Z86010 Personal history of colonic polyps: Secondary | ICD-10-CM | POA: Insufficient documentation

## 2013-09-20 DIAGNOSIS — E6609 Other obesity due to excess calories: Secondary | ICD-10-CM | POA: Insufficient documentation

## 2013-09-20 DIAGNOSIS — N529 Male erectile dysfunction, unspecified: Secondary | ICD-10-CM

## 2013-09-20 MED ORDER — TAMSULOSIN HCL 0.4 MG PO CAPS: ORAL_CAPSULE | ORAL | Status: DC

## 2013-09-20 MED ORDER — METOPROLOL SUCCINATE ER 50 MG PO TB24: ORAL_TABLET | ORAL | Status: DC

## 2013-09-20 MED ORDER — ATORVASTATIN CALCIUM 80 MG PO TABS: ORAL_TABLET | ORAL | Status: DC

## 2013-09-20 MED ORDER — EZETIMIBE 10 MG PO TABS: ORAL_TABLET | ORAL | Status: DC

## 2013-09-20 MED ORDER — ASPIRIN 81 MG PO CHEW: 81.0000 mg | CHEWABLE_TABLET | Freq: Every day | ORAL | Status: DC

## 2013-09-20 MED ORDER — LOSARTAN POTASSIUM 100 MG PO TABS: ORAL_TABLET | ORAL | Status: DC

## 2013-09-20 NOTE — Patient Instructions (Signed)
Limit your sodium (Salt) intake    It is important that you exercise regularly, at least 20 minutes 3 to 4 times per week.  If you develop chest pain or shortness of breath seek  medical attention.  You need to lose weight.  Consider a lower calorie diet and regular exercise.\  Return in 6 months for follow-up   Health Maintenance A healthy lifestyle and preventative care can promote health and wellness.  Maintain regular health, dental, and eye exams.  Eat a healthy diet. Foods like vegetables, fruits, whole grains, low-fat dairy products, and lean protein foods contain the nutrients you need and are low in calories. Decrease your intake of foods high in solid fats, added sugars, and salt. Get information about a proper diet from your health care provider, if necessary.  Regular physical exercise is one of the most important things you can do for your health. Most adults should get at least 150 minutes of moderate-intensity exercise (any activity that increases your heart rate and causes you to sweat) each week. In addition, most adults need muscle-strengthening exercises on 2 or more days a week.   Maintain a healthy weight. The body mass index (BMI) is a screening tool to identify possible weight problems. It provides an estimate of body fat based on height and weight. Your health care provider can find your BMI and can help you achieve or maintain a healthy weight. For males 20 years and older:  A BMI below 18.5 is considered underweight.  A BMI of 18.5 to 24.9 is normal.  A BMI of 25 to 29.9 is considered overweight.  A BMI of 30 and above is considered obese.  Maintain normal blood lipids and cholesterol by exercising and minimizing your intake of saturated fat. Eat a balanced diet with plenty of fruits and vegetables. Blood tests for lipids and cholesterol should begin at age 20 and be repeated every 5 years. If your lipid or cholesterol levels are high, you are over age 50, or  you are at high risk for heart disease, you may need your cholesterol levels checked more frequently.Ongoing high lipid and cholesterol levels should be treated with medicines if diet and exercise are not working.  If you smoke, find out from your health care provider how to quit. If you do not use tobacco, do not start.  Lung cancer screening is recommended for adults aged 55-80 years who are at high risk for developing lung cancer because of a history of smoking. A yearly low-dose CT scan of the lungs is recommended for people who have at least a 30-pack-year history of smoking and are current smokers or have quit within the past 15 years. A pack year of smoking is smoking an average of 1 pack of cigarettes a day for 1 year (for example, a 30-pack-year history of smoking could mean smoking 1 pack a day for 30 years or 2 packs a day for 15 years). Yearly screening should continue until the smoker has stopped smoking for at least 15 years. Yearly screening should be stopped for people who develop a health problem that would prevent them from having lung cancer treatment.  If you choose to drink alcohol, do not have more than 2 drinks per day. One drink is considered to be 12 oz (360 mL) of beer, 5 oz (150 mL) of wine, or 1.5 oz (45 mL) of liquor.  Avoid the use of street drugs. Do not share needles with anyone. Ask for help if you   need support or instructions about stopping the use of drugs.  High blood pressure causes heart disease and increases the risk of stroke. Blood pressure should be checked at least every 1-2 years. Ongoing high blood pressure should be treated with medicines if weight loss and exercise are not effective.  If you are 45-79 years old, ask your health care provider if you should take aspirin to prevent heart disease.  Diabetes screening involves taking a blood sample to check your fasting blood sugar level. This should be done once every 3 years after age 45 if you are at a  normal weight and without risk factors for diabetes. Testing should be considered at a younger age or be carried out more frequently if you are overweight and have at least 1 risk factor for diabetes.  Colorectal cancer can be detected and often prevented. Most routine colorectal cancer screening begins at the age of 50 and continues through age 75. However, your health care provider may recommend screening at an earlier age if you have risk factors for colon cancer. On a yearly basis, your health care provider may provide home test kits to check for hidden blood in the stool. A small camera at the end of a tube may be used to directly examine the colon (sigmoidoscopy or colonoscopy) to detect the earliest forms of colorectal cancer. Talk to your health care provider about this at age 50 when routine screening begins. A direct exam of the colon should be repeated every 5-10 years through age 75, unless early forms of precancerous polyps or small growths are found.  People who are at an increased risk for hepatitis B should be screened for this virus. You are considered at high risk for hepatitis B if:  You were born in a country where hepatitis B occurs often. Talk with your health care provider about which countries are considered high risk.  Your parents were born in a high-risk country and you have not received a shot to protect against hepatitis B (hepatitis B vaccine).  You have HIV or AIDS.  You use needles to inject street drugs.  You live with, or have sex with, someone who has hepatitis B.  You are a man who has sex with other men (MSM).  You get hemodialysis treatment.  You take certain medicines for conditions like cancer, organ transplantation, and autoimmune conditions.  Hepatitis C blood testing is recommended for all people born from 1945 through 1965 and any individual with known risk factors for hepatitis C.  Healthy men should no longer receive prostate-specific antigen  (PSA) blood tests as part of routine cancer screening. Talk to your health care provider about prostate cancer screening.  Testicular cancer screening is not recommended for adolescents or adult males who have no symptoms. Screening includes self-exam, a health care provider exam, and other screening tests. Consult with your health care provider about any symptoms you have or any concerns you have about testicular cancer.  Practice safe sex. Use condoms and avoid high-risk sexual practices to reduce the spread of sexually transmitted infections (STIs).  You should be screened for STIs, including gonorrhea and chlamydia if:  You are sexually active and are younger than 24 years.  You are older than 24 years, and your health care provider tells you that you are at risk for this type of infection.  Your sexual activity has changed since you were last screened, and you are at an increased risk for chlamydia or gonorrhea. Ask your health   if you are at risk.  If you are at risk of being infected with HIV, it is recommended that you take a prescription medicine daily to prevent HIV infection. This is called pre-exposure prophylaxis (PrEP). You are considered at risk if:  You are a man who has sex with other men (MSM).  You are a heterosexual man who is sexually active with multiple partners.  You take drugs by injection.  You are sexually active with a partner who has HIV.  Talk with your health care provider about whether you are at high risk of being infected with HIV. If you choose to begin PrEP, you should first be tested for HIV. You should then be tested every 3 months for as long as you are taking PrEP.  Use sunscreen. Apply sunscreen liberally and repeatedly throughout the day. You should seek shade when your shadow is shorter than you. Protect yourself by wearing long sleeves, pants, a wide-brimmed hat, and sunglasses year round whenever you are outdoors.  Tell your health care  provider of new moles or changes in moles, especially if there is a change in shape or color. Also, tell your health care provider if a mole is larger than the size of a pencil eraser.  A one-time screening for abdominal aortic aneurysm (AAA) and surgical repair of large AAAs by ultrasound is recommended for men aged 7-75 years who are current or former smokers.  Stay current with your vaccines (immunizations). Document Released: 07/17/2007 Document Revised: 01/23/2013 Document Reviewed: 06/15/2010 [facility] Patient Information 2015 [place], [place]. This information is not intended to replace advice given to you by your health care provider. Make sure you discuss any questions you have with your health care provider. Cardiac Diet This diet can help prevent heart disease and stroke. Many factors influence your heart health, including eating and exercise habits. Coronary risk rises a lot with abnormal blood fat (lipid) levels. Cardiac meal planning includes limiting unhealthy fats, increasing healthy fats, and making other small dietary changes. General guidelines are as follows:  Adjust calorie intake to reach and maintain desirable body weight.  Limit total fat intake to less than 30% of total calories. Saturated fat should be less than 7% of calories.  Saturated fats are found in animal products and in some vegetable products. Saturated vegetable fats are found in coconut oil, cocoa butter, palm oil, and palm kernel oil. Read labels carefully to avoid these products as much as possible. Use butter in moderation. Choose tub margarines and oils that have 2 grams of fat or less. Good cooking oils are canola and olive oils.  Practice low-fat cooking techniques. Do not fry food. Instead, broil, bake, boil, steam, grill, roast on a rack, stir-fry, or microwave it. Other fat reducing suggestions include:  Remove the skin from poultry.  Remove all visible fat from meats.  Skim the fat off stews,  soups, and gravies before serving them.  Steam vegetables in water or broth instead of sauting them in fat.  Avoid foods with trans fat (or hydrogenated oils), such as commercially fried foods and commercially baked goods. Commercial shortening and deep-frying fats will contain trans fat.  Increase intake of fruits, vegetables, whole grains, and legumes to replace foods high in fat.  Increase consumption of nuts, legumes, and seeds to at least 4 servings weekly. One serving of a legume equals  cup, and 1 serving of nuts or seeds equals  cup.  Choose whole grains more often. Have 3 servings per day (a  serving is 1 ounce [oz]).  Eat 4 to 5 servings of vegetables per day. A serving of vegetables is 1 cup of raw leafy vegetables;  cup of raw or cooked cut-up vegetables;  cup of vegetable juice.  Eat 4 to 5 servings of fruit per day. A serving of fruit is 1 medium whole fruit;  cup of dried fruit;  cup of fresh, frozen, or canned fruit;  cup of 100% fruit juice.  Increase your intake of dietary fiber to 20 to 30 grams per day. Insoluble fiber may help lower your risk of heart disease and may help curb your appetite.  Soluble fiber binds cholesterol to be removed from the blood. Foods high in soluble fiber are dried beans, citrus fruits, oats, apples, bananas, broccoli, Brussels sprouts, and eggplant.  Try to include foods fortified with plant sterols or stanols, such as yogurt, breads, juices, or margarines. Choose several fortified foods to achieve a daily intake of 2 to 3 grams of plant sterols or stanols.  Foods with omega-3 fats can help reduce your risk of heart disease. Aim to have a 3.5 oz portion of fatty fish twice per week, such as salmon, mackerel, albacore tuna, sardines, lake trout, or herring. If you wish to take a fish oil supplement, choose one that contains 1 gram of both DHA and EPA.  Limit processed meats to 2 servings (3 oz portion) weekly.  Limit the sodium in your  diet to 1500 milligrams (mg) per day. If you have high blood pressure, talk to a registered dietitian about a DASH (Dietary Approaches to Stop Hypertension) eating plan.  Limit sweets and beverages with added sugar, such as soda, to no more than 5 servings per week. One serving is:   1 tablespoon sugar.  1 tablespoon jelly or jam.   cup sorbet.  1 cup lemonade.   cup regular soda. CHOOSING FOODS Starches  Allowed: Breads: All kinds (wheat, rye, raisin, white, oatmeal, [place], [place], and English muffin bread). Low-fat rolls: English muffins, frankfurter and hamburger buns, bagels, pita bread, tortillas (not fried). Pancakes, waffles, biscuits, and muffins made with recommended oil.  Avoid: Products made with saturated or trans fats, oils, or whole milk products. Butter rolls, cheese breads, croissants. Commercial doughnuts, muffins, sweet rolls, biscuits, waffles, pancakes, store-bought mixes. Crackers  Allowed: Low-fat crackers and snacks: Animal, graham, rye, saltine (with recommended oil, no lard), oyster, and matzo crackers. Bread sticks, melba toast, rusks, flatbread, pretzels, and light popcorn.  Avoid: High-fat crackers: cheese crackers, butter crackers, and those made with coconut, palm oil, or trans fat (hydrogenated oils). Buttered popcorn. Cereals  Allowed: Hot or cold whole-grain cereals.  Avoid: Cereals containing coconut, hydrogenated vegetable fat, or animal fat. Potatoes / Pasta / Rice  Allowed: All kinds of potatoes, rice, and pasta (such as macaroni, spaghetti, and noodles).  Avoid: Pasta or rice prepared with cream sauce or high-fat cheese. Chow mein noodles, [place] fries. Vegetables  Allowed: All vegetables and vegetable juices.  Avoid: Fried vegetables. Vegetables in cream, butter, or high-fat cheese sauces. Limit coconut. Fruit in cream or custard. Protein  Allowed: Limit your intake of meat, seafood, and poultry to no more than 6 oz (cooked weight)  per day. All lean, well-trimmed beef, veal, pork, and lamb. All chicken and [place] without skin. All fish and shellfish. Wild game: wild duck, rabbit, pheasant, and venison. Egg whites or low-cholesterol egg substitutes may be used as desired. Meatless dishes: recipes with dried beans, peas, lentils, and tofu (soybean curd). Seeds  and nuts: all seeds and most nuts.  Avoid: Prime grade and other heavily marbled and fatty meats, such as short ribs, spare ribs, rib eye roast or steak, frankfurters, sausage, bacon, and high-fat luncheon meats, mutton. Caviar. Commercially fried fish. Domestic duck, goose, venison sausage. Organ meats: liver, gizzard, heart, chitterlings, brains, kidney, sweetbreads. Dairy  Allowed: Low-fat cheeses: nonfat or low-fat cottage cheese (1% or 2% fat), cheeses made with part skim milk, such as mozzarella, farmers, string, or ricotta. (Cheeses should be labeled no more than 2 to 6 grams fat per oz.). Skim (or 1%) milk: liquid, powdered, or evaporated. Buttermilk made with low-fat milk. Drinks made with skim or low-fat milk or cocoa. Chocolate milk or cocoa made with skim or low-fat (1%) milk. Nonfat or low-fat yogurt.  Avoid: Whole milk cheeses, including colby, cheddar, [place], Monterey Jack, [place], [place], [place], American, Swiss, and blue. Creamed cottage cheese, cream cheese. Whole milk and whole milk products, including buttermilk or yogurt made from whole milk, drinks made from whole milk. Condensed milk, evaporated whole milk, and 2% milk. Soups and Combination Foods  Allowed: Low-fat low-sodium soups: broth, dehydrated soups, homemade broth, soups with the fat removed, homemade cream soups made with skim or low-fat milk. Low-fat spaghetti, lasagna, chili, and Spanish rice if low-fat ingredients and low-fat cooking techniques are used.  Avoid: Cream soups made with whole milk, cream, or high-fat cheese. All other soups. Desserts and Sweets  Allowed: Sherbet, fruit  ices, gelatins, meringues, and angel food cake. Homemade desserts with recommended fats, oils, and milk products. Jam, jelly, honey, marmalade, sugars, and syrups. Pure sugar candy, such as gum drops, hard candy, jelly beans, marshmallows, mints, and small amounts of dark chocolate.  Avoid: Commercially prepared cakes, pies, cookies, frosting, pudding, or mixes for these products. Desserts containing whole milk products, chocolate, coconut, lard, palm oil, or palm kernel oil. Ice cream or ice cream drinks. Candy that contains chocolate, coconut, butter, hydrogenated fat, or unknown ingredients. Buttered syrups. Fats and Oils  Allowed: Vegetable oils: safflower, sunflower, corn, soybean, cottonseed, sesame, canola, olive, or peanut. Non-hydrogenated margarines. Salad dressing or mayonnaise: homemade or commercial, made with a recommended oil. Low or nonfat salad dressing or mayonnaise.  Limit added fats and oils to 6 to 8 tsp per day (includes fats used in cooking, baking, salads, and spreads on bread). Remember to count the "hidden fats" in foods.  Avoid: Solid fats and shortenings: butter, lard, salt pork, bacon drippings. Gravy containing meat fat, shortening, or suet. Cocoa butter, coconut. Coconut oil, palm oil, palm kernel oil, or hydrogenated oils: these ingredients are often used in bakery products, nondairy creamers, whipped toppings, candy, and commercially fried foods. Read labels carefully. Salad dressings made of unknown oils, sour cream, or cheese, such as blue cheese and Ro[place]ef[place]. Cream, all kinds: half-and-half, light, heavy, or whipping. Sour cream or cream cheese (even if "light" or low-fat). Nondairy cream substitutes: coffee creamers and sour cream substitutes made with palm, palm kernel, hydrogenated oils, or coconut oil. Beverages  Allowed: Coffee (regular or decaffeinated), tea. Diet carbonated beverages, mineral water. Alcohol: Check with your caregiver. Moderation is  recommended.  Avoid: Whole milk, regular sodas, and juice drinks with added sugar. Condiments  Allowed: All seasonings and condiments. Cocoa powder. "Cream" sauces made with recommended ingredients.  Avoid: Carob powder made with hydrogenated fats. SAMPLE MENU Breakfast   cup orange juice   cup oatmeal  1 slice toast  1 tsp margarine  1 cup skim milk Lunch  [place] sandwich  with 2 oz [place], 2 slices bread  Lettuce and tomato slices  Fresh fruit  Carrot sticks  Coffee or tea Snack  Fresh fruit or low-fat crackers Dinner  3 oz lean ground beef  1 baked potato  1 tsp margarine   cup asparagus  Lettuce salad  1 tbs non-creamy dressing   cup peach slices  1 cup skim milk Document Released: 10/28/2007 Document Revised: 07/20/2011 Document Reviewed: 03/20/2013 [employer] Patient Information 2015 [place], [place]. This information is not intended to replace advice given to you by your health care provider. Make sure you discuss any questions you have with your health care provider.

## 2013-09-20 NOTE — Progress Notes (Signed)
Pre visit review using our clinic review tool, if applicable. No additional management support is needed unless otherwise documented below in the visit note. 

## 2013-09-20 NOTE — Progress Notes (Signed)
Subjective:    Patient ID: [name], male    DOB: [birth year], 63 y.o.   MRN: [id number]  HPI  Wt Readings from Last 3 Encounters:  09/20/13 257 lb (116.574 kg)  08/28/13 257 lb (116.574 kg)  10/26/11 259 lb (117.19 kg)   63 year old patient who is seen today for a preventive health examination. He has been seen by cardiology recently and has been scheduled for carotid artery.  Doppler studies, as well as a nuclear stress test.  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 Carotid vascular risk factors include hypertension and dyslipidemia.  He has obesity Additionally, he has a history of impaired glucose tolerance over the past several years.  Slight worsening of thrombocytopenia  Past Medical History  Diagnosis Date  . Adjustment disorder with depressed mood 09/07/2007  . CONGESTIVE HEART FAILURE 12/09/2008  . CORONARY ARTERY DISEASE 08/09/2006  . HYPERLIPIDEMIA 08/09/2006  . HYPERTENSION 08/09/2006  . MYOCARDIAL INFARCTION, HX OF 08/09/2006  . 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 OBESITY 07/19/2008  . Colon polyps     Tubular Adenoma 2010    History   Social History  . Marital Status: Single    Spouse Name: N/A    Number of Children: N/A  . Years of Education: N/A   Occupational History  . Not on file.   Social History Main Topics  . Smoking status: Never Smoker   . Smokeless tobacco: Never Used  . Alcohol Use: No  . Drug Use: No  . Sexual Activity: Not on file   Other Topics Concern  . Not on file   Social History Narrative  . No narrative on file    Past Surgical History  Procedure Laterality Date  . Coronary artery bypass graft      Family History  Problem Relation Age of Onset  . Hypertension Mother   . Hyperlipidemia Mother   . Heart disease Father   . Ulcers Father     No Known Allergies  Current Outpatient Prescriptions on File Prior to Visit  Medication Sig Dispense Refill  . aspirin 325 MG tablet Take 325 mg by  mouth daily.         No current facility-administered medications on file prior to visit.    BP 110/64  Pulse 74  Temp(Src) 98 F (36.7 C) (Oral)  Resp 20  Ht 5\' 5"  (1.651 m)  Wt 257 lb (116.574 kg)  BMI 42.77 kg/m2  SpO2 98%     Review of Systems  Constitutional: Negative for fever, chills, activity change, appetite change and fatigue.  HENT: Negative for congestion, dental problem, ear pain, hearing loss, mouth sores, rhinorrhea, sinus pressure, sneezing, tinnitus, trouble swallowing and voice change.   Eyes: Negative for photophobia, pain, redness and visual disturbance.  Respiratory: Negative for apnea, cough, choking, chest tightness, shortness of breath and wheezing.   Cardiovascular: Negative for chest pain, palpitations and leg swelling.  Gastrointestinal: Negative for nausea, vomiting, abdominal pain, diarrhea, constipation, blood in stool, abdominal distention, anal bleeding and rectal pain.  Genitourinary: Negative for dysuria, urgency, frequency, hematuria, flank pain, decreased urine volume, discharge, penile swelling, scrotal swelling, difficulty urinating, genital sores and testicular pain.  Musculoskeletal: Negative for arthralgias, back pain, gait problem, joint swelling, myalgias, neck pain and neck stiffness.  Skin: Negative for color change, rash and wound.  Neurological: Negative for dizziness, tremors, seizures, syncope, facial asymmetry, speech difficulty, weakness, light-headedness, numbness and headaches.  Hematological: Negative  for adenopathy. Does not bruise/bleed easily.  Psychiatric/Behavioral: Negative for suicidal ideas, hallucinations, behavioral problems, confusion, sleep disturbance, self-injury, dysphoric mood, decreased concentration and agitation. The patient is not nervous/anxious.        Objective:   Physical Exam  Constitutional: He appears well-developed and well-nourished.  HENT:  Head: Normocephalic and atraumatic.  Right Ear:  External ear normal.  Left Ear: External ear normal.  Nose: Nose normal.  Mouth/Throat: Oropharynx is clear and moist.  Eyes: Conjunctivae and EOM are normal. Pupils are equal, round, and reactive to light. No scleral icterus.  Neck: Normal range of motion. Neck supple. No JVD present. No thyromegaly present.  Cardiovascular: Regular rhythm, normal heart sounds and intact distal pulses.  Exam reveals no gallop and no friction rub.   No murmur heard. Pulmonary/Chest: Effort normal and breath sounds normal. He exhibits no tenderness.  Sternotomy scar  Abdominal: Soft. Bowel sounds are normal. He exhibits no distension and no mass. There is no tenderness.  Genitourinary: Prostate normal and penis normal.  Musculoskeletal: Normal range of motion. He exhibits no edema and no tenderness.  Lymphadenopathy:    He has no cervical adenopathy.  Neurological: He is alert. He has normal reflexes. No cranial nerve deficit. Coordination normal.  Skin: Skin is warm and dry. No rash noted.  Erythematous intertriginous rash of the groin  Psychiatric: He has a normal mood and affect. His behavior is normal.          Assessment & Plan:   Preventive health examination Impaired glucose tolerance.  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  We'll recheck in 6 months.  May need hematology referral Coronary artery disease.  Scheduled for nuclear stress test Dyslipidemia.  Discontinue Zetia ED we'll check a testosterone level

## 2013-09-24 ENCOUNTER — Other Ambulatory Visit (HOSPITAL_COMMUNITY): Payer: Self-pay | Admitting: Cardiology

## 2013-09-24 DIAGNOSIS — R0989 Other specified symptoms and signs involving the circulatory and respiratory systems: Secondary | ICD-10-CM

## 2013-09-24 DIAGNOSIS — I25709 Atherosclerosis of coronary artery bypass graft(s), unspecified, with unspecified angina pectoris: Secondary | ICD-10-CM

## 2013-09-25 ENCOUNTER — Telehealth (HOSPITAL_COMMUNITY): Payer: Self-pay

## 2013-09-25 NOTE — Telephone Encounter (Signed)
Encounter complete. 

## 2013-09-27 ENCOUNTER — Telehealth: Payer: Self-pay | Admitting: Physician Assistant

## 2013-09-27 ENCOUNTER — Ambulatory Visit (HOSPITAL_COMMUNITY)
Admission: RE | Admit: 2013-09-27 | Discharge: 2013-09-27 | Disposition: A | Discharge status: Home / Self Care | Payer: 59 | Source: Ambulatory Visit | Attending: Cardiovascular Disease | Admitting: Cardiovascular Disease

## 2013-09-27 VITALS — Ht 66.0 in | Wt 257.0 lb

## 2013-09-27 DIAGNOSIS — I25709 Atherosclerosis of coronary artery bypass graft(s), unspecified, with unspecified angina pectoris: Secondary | ICD-10-CM

## 2013-09-27 DIAGNOSIS — I209 Angina pectoris, unspecified: Secondary | ICD-10-CM | POA: Insufficient documentation

## 2013-09-27 DIAGNOSIS — R0989 Other specified symptoms and signs involving the circulatory and respiratory systems: Secondary | ICD-10-CM | POA: Insufficient documentation

## 2013-09-27 DIAGNOSIS — I25810 Atherosclerosis of coronary artery bypass graft(s) without angina pectoris: Secondary | ICD-10-CM | POA: Diagnosis present

## 2013-09-27 MED ORDER — TECHNETIUM TC 99M SESTAMIBI GENERIC - CARDIOLITE
30.6000 | Freq: Once | INTRAVENOUS | Status: AC | PRN
  Administered 2013-09-27: 31 via INTRAVENOUS

## 2013-09-27 MED ORDER — TECHNETIUM TC 99M SESTAMIBI GENERIC - CARDIOLITE: 745.0000 | Freq: Once | INTRAVENOUS | Status: DC | PRN

## 2013-09-27 MED ORDER — TECHNETIUM TC 99M SESTAMIBI GENERIC - CARDIOLITE
10.9000 | Freq: Once | INTRAVENOUS | Status: AC | PRN
  Administered 2013-09-27: 10.9 via INTRAVENOUS

## 2013-09-27 MED ORDER — TECHNETIUM TC 99M SESTAMIBI GENERIC - CARDIOLITE: 10.9000 | Freq: Once | INTRAVENOUS | Status: DC | PRN

## 2013-09-27 MED ORDER — TECHNETIUM TC 99M SESTAMIBI GENERIC - CARDIOLITE: 31.5000 | Freq: Once | INTRAVENOUS | Status: DC | PRN

## 2013-09-27 MED ORDER — TECHNETIUM TC 99M SESTAMIBI GENERIC - CARDIOLITE: 10.3000 | Freq: Once | INTRAVENOUS | Status: DC | PRN

## 2013-09-27 NOTE — Telephone Encounter (Signed)
     The patient called the after hours provider to ask about results of carotid doppler and myoview today. He said someone called him from the office today but he could not reach them back. I read him the results of his myoview stress test but the dopplers had not been uploaded. The myoview was resulted as a low risk study with "a questionable abnormality in hemodynamic response." I am not sure what this means and I advised him to call the office during normal office hours for clarification.   Angelena Form PA-C  [facility]

## 2013-09-27 NOTE — Progress Notes (Signed)
Carotid Duplex Completed. °Brianna L Mazza,RVT °

## 2013-09-27 NOTE — Procedures (Addendum)
[place] [address] [place] [address] [phone]  Cardiology Nuclear Med Study  [name] is a 63 y.o. male     MRN : [id number]     DOB: 12-13-1950  Procedure Date: 09/27/2013  Nuclear Med Background Indication for Stress Test:  Graft Patency and Abnormal EKG History:  CAD;MI;CABG X4-2006;No prior respiratory history reported;No prior NUC MPI for comparison;ECHO in 12/2008-EF=50-55% Cardiac Risk Factors: Family History - CAD, Hypertension, Lipids and Obesity  Symptoms:  Pt denies symptoms at this time.   Nuclear Pre-Procedure Caffeine/Decaff Intake:  7:00pm NPO After: 7:00am   IV Site: R Forearm  IV 0.9% NS with Angio Cath:  22g  Chest Size (in):  52"  IV Started by: Rolene Course, RN  Height: 5\' 6"  (1.676 m)  Cup Size: n/a  BMI:  Body mass index is 41.5 kg/(m^2). Weight:  257 lb (116.574 kg)   Tech Comments:  n/a    Nuclear Med Study 1 or 2 day study: 1 day  Stress Test Type:  Stress  Order Authorizing Provider:  Kirk Ruths, MD   Resting Radionuclide: Technetium 3m Sestamibi  Resting Radionuclide Dose: 10.9 mCi   Stress Radionuclide:  Technetium 9m Sestamibi  Stress Radionuclide Dose: 30.6 mCi           Stress Protocol Rest HR: 90 Stress HR: 137  Rest BP: 152/84 Stress BP: 159/76  Exercise Time (min): 6:42 METS: 8   Predicted Max HR: 157 bpm % Max HR: 87.26 bpm Rate Pressure Product: 21783  Dose of Adenosine (mg):  n/a Dose of Lexiscan: n/a mg  Dose of Atropine (mg): n/a Dose of Dobutamine: n/a mcg/kg/min (at max HR)  Stress Test Technologist: Leane Para, CCT Nuclear Technologist: Imagene Riches, CNMT   Rest Procedure:  Myocardial perfusion imaging was performed at rest 45 minutes following the intravenous administration of Technetium 54m Sestamibi. Stress Procedure:  The patient performed treadmill exercise using a Bruce  Protocol for 6:42  minutes. The patient stopped due to  01UXNA drop in Systolic Pressure, Fatigue and SOB and denied any chest pain.  There were no significant ST-T wave changes.  Technetium 84m Sestamibi was injected at peak exercise and myocardial perfusion imaging was performed after a brief delay.  Transient Ischemic Dilatation (Normal <1.22):  1.01  QGS EDV:  142 ml QGS ESV:  64 ml LV Ejection Fraction: 55%     Rest ECG: NSR, LAFB, rare PVCs  Stress ECG: No significant change from baseline ECG  QPS Raw Data Images:  Normal; no motion artifact; normal heart/lung ratio. Stress Images:  Normal homogeneous uptake in all areas of the myocardium. Rest Images:  Normal homogeneous uptake in all areas of the myocardium. Subtraction (SDS):  No evidence of ischemia. LV Wall Motion:  NL LV Function; NL Wall Motion  Impression Exercise Capacity:  Fair exercise capacity. BP Response:  Transient reduction in BP at peak exercise - mild and of uncertain significance Clinical Symptoms:  No significant symptoms noted. ECG Impression:  No significant ST segment change suggestive of ischemia. Comparison with Prior Nuclear Study: No previous nuclear study performed   Overall Impression:  Low risk stress nuclear study with a questionable abnormality in hemodynamic response.   Sanda Klein, MD  09/27/2013 1:16 PM

## 2013-09-28 ENCOUNTER — Encounter: Payer: Self-pay | Admitting: Cardiology

## 2013-09-28 NOTE — Telephone Encounter (Signed)
°  Patient is returning call from yesterday regarding test results. Please call back

## 2013-09-28 NOTE — Telephone Encounter (Signed)
This encounter was created in error - please disregard.

## 2013-10-24 ENCOUNTER — Encounter: Payer: Self-pay | Admitting: Cardiology

## 2013-10-24 NOTE — Telephone Encounter (Signed)
New Message  Follow up for Carotid results

## 2013-10-24 NOTE — Telephone Encounter (Signed)
This encounter was created in error - please disregard.

## 2014-03-25 ENCOUNTER — Ambulatory Visit: Payer: 59 | Admitting: Internal Medicine

## 2014-04-03 ENCOUNTER — Encounter: Payer: Self-pay | Admitting: Gastroenterology

## 2014-04-11 ENCOUNTER — Ambulatory Visit: Payer: Self-pay | Admitting: Internal Medicine

## 2014-08-06 ENCOUNTER — Telehealth: Payer: Self-pay | Admitting: Cardiology

## 2014-08-06 NOTE — Telephone Encounter (Signed)
Patient notes after doing yardwork the other day, became aware of unilateral calf swelling. Seemed to occur overnight. Notes tender to touch w/ diffuse redness. He has been running a fever since Saturday which he has treated w/ Tylenol.  No bilateral swelling. Notes no shortness of breath. Advised to contact PCP to r/o cellulitis vs DVT or other problems, given history. If they think is cardiology concern, to call back.  Pt verbalized understanding.

## 2014-08-06 NOTE — Telephone Encounter (Signed)
New Message  After yardwork Sat 7/2- overnight dev fever and swelling/inflammation in right calf/ankle. Pt took tylanol- took care of fever. Still swollen in right calf. Please call back and discuss.   Pt c/o swelling: STAT is pt has developed SOB within 24 hours  1. How long have you been experiencing swelling? Since sat 7/2  2. Where is the swelling located? Rt calf/ankle  3.  Are you currently taking a "fluid pill"? no   4.  Are you currently SOB? no  5.  Have you traveled recently? No

## 2014-08-07 ENCOUNTER — Ambulatory Visit (INDEPENDENT_AMBULATORY_CARE_PROVIDER_SITE_OTHER): Payer: 59 | Admitting: Internal Medicine

## 2014-08-07 ENCOUNTER — Encounter: Payer: Self-pay | Admitting: Internal Medicine

## 2014-08-07 VITALS — BP 110/62 | HR 77 | Temp 98.4°F | Resp 20 | Ht 66.0 in | Wt 260.0 lb

## 2014-08-07 DIAGNOSIS — L02419 Cutaneous abscess of limb, unspecified: Secondary | ICD-10-CM

## 2014-08-07 DIAGNOSIS — L03119 Cellulitis of unspecified part of limb: Secondary | ICD-10-CM

## 2014-08-07 DIAGNOSIS — I10 Essential (primary) hypertension: Secondary | ICD-10-CM

## 2014-08-07 MED ORDER — AMOXICILLIN-POT CLAVULANATE 875-125 MG PO TABS: 1.0000 | ORAL_TABLET | Freq: Two times a day (BID) | ORAL | Status: DC

## 2014-08-07 NOTE — Progress Notes (Signed)
Subjective:    Patient ID: [name], male    DOB: 05-Jul-1950, 64 y.o.   MRN: [id number]  HPI 64 year old patient who has coronary artery disease and essential hypertension.  He also has a remote history of right leg cellulitis in 2009.  He presents with a four-day history of increasing pain, swelling, redness involving his right lower leg and fever.  There has been some modest clinical improvement over the past day or 2.  He has been using Tylenol for pain and fever control. Prior to the onset of the right leg pain and swelling.  He spent the day doing considerable yardwork.  Past Medical History  Diagnosis Date  . Adjustment disorder with depressed mood 09/07/2007  . CONGESTIVE HEART FAILURE 12/09/2008  . CORONARY ARTERY DISEASE 08/09/2006  . HYPERLIPIDEMIA 08/09/2006  . HYPERTENSION 08/09/2006  . MYOCARDIAL INFARCTION, HX OF 08/09/2006  . 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 OBESITY 07/19/2008  . Colon polyps     Tubular Adenoma 2010    History   Social History  . Marital Status: Single    Spouse Name: N/A  . Number of Children: N/A  . Years of Education: N/A   Occupational History  . Not on file.   Social History Main Topics  . Smoking status: Never Smoker   . Smokeless tobacco: Never Used  . Alcohol Use: No  . Drug Use: No  . Sexual Activity: Not on file   Other Topics Concern  . Not on file   Social History Narrative    Past Surgical History  Procedure Laterality Date  . Coronary artery bypass graft      Family History  Problem Relation Age of Onset  . Hypertension Mother   . Hyperlipidemia Mother   . Heart disease Father   . Ulcers Father     No Known Allergies  Current Outpatient Prescriptions on File Prior to Visit  Medication Sig Dispense Refill  . aspirin (ASPIRIN CHILDRENS) 81 MG chewable tablet Chew 1 tablet (81 mg total) by mouth daily.    Marland Kitchen atorvastatin (LIPITOR) 80 MG tablet TAKE 1 TABLET BY MOUTH EVERY DAY 90 tablet 3  . losartan (COZAAR)  100 MG tablet TAKE 1 TABLET BY MOUTH EVERY DAY 90 tablet 3  . metoprolol succinate (TOPROL-XL) 50 MG 24 hr tablet TAKE 1 TABLET BY MOUTH EVERY DAY 90 tablet 3  . tamsulosin (FLOMAX) 0.4 MG CAPS capsule TAKE ONE CAPSULE BY MOUTH EVERY DAY AFTER SUPPER 90 capsule 3   No current facility-administered medications on file prior to visit.    BP 110/62 mmHg  Pulse 77  Temp(Src) 98.4 F (36.9 C) (Oral)  Resp 20  Ht 5\' 6"  (1.676 m)  Wt 260 lb (117.935 kg)  BMI 41.99 kg/m2  SpO2 96%     Review of Systems  Constitutional: Negative for fever, chills, appetite change and fatigue.  HENT: Negative for congestion, dental problem, ear pain, hearing loss, sore throat, tinnitus, trouble swallowing and voice change.   Eyes: Negative for pain, discharge and visual disturbance.  Respiratory: Negative for cough, chest tightness, wheezing and stridor.   Cardiovascular: Negative for chest pain, palpitations and leg swelling.  Gastrointestinal: Negative for nausea, vomiting, abdominal pain, diarrhea, constipation, blood in stool and abdominal distention.  Genitourinary: Negative for urgency, hematuria, flank pain, discharge, difficulty urinating and genital sores.  Musculoskeletal: Negative for myalgias, back pain, joint swelling, arthralgias, gait problem and neck stiffness.  Skin: Positive for color change and rash.  Neurological:  Negative for dizziness, syncope, speech difficulty, weakness, numbness and headaches.  Hematological: Negative for adenopathy. Does not bruise/bleed easily.  Psychiatric/Behavioral: Negative for behavioral problems and dysphoric mood. The patient is not nervous/anxious.        Objective:   Physical Exam  Constitutional: He is oriented to person, place, and time. He appears well-developed.  Afebrile No tachycardia  HENT:  Head: Normocephalic.  Right Ear: External ear normal.  Left Ear: External ear normal.  Eyes: Conjunctivae and EOM are normal.  Neck: Normal range of  motion.  Cardiovascular: Normal rate and normal heart sounds.   Pulmonary/Chest: Breath sounds normal.  Abdominal: Bowel sounds are normal.  Musculoskeletal: Normal range of motion. He exhibits no edema or tenderness.  Neurological: He is alert and oriented to person, place, and time.  Skin:  Redness, excessive warmth and mild swelling involving the right lower leg, especially anteriorly.  No calf tenderness  Psychiatric: He has a normal mood and affect. His behavior is normal.          Assessment & Plan:   Right lower leg cellulitis.  Will treat with antibiotic therapy Patient instructions dispensed and discussed  Essential hypertension, stable  Patient will report any new or worsening symptoms CPX as scheduled

## 2014-08-07 NOTE — Progress Notes (Signed)
Pre visit review using our clinic review tool, if applicable. No additional management support is needed unless otherwise documented below in the visit note. 

## 2014-08-07 NOTE — Patient Instructions (Addendum)
Take your antibiotic as prescribed until ALL of it is gone, but stop if you develop a rash, swelling, or any side effects of the medication.  Contact our office as soon as possible if  there are side effects of the medication.  Cellulitis Cellulitis is an infection of the skin and the tissue beneath it. The infected area is usually red and tender. Cellulitis occurs most often in the arms and lower legs.  CAUSES  Cellulitis is caused by bacteria that enter the skin through cracks or cuts in the skin. The most common types of bacteria that cause cellulitis are staphylococci and streptococci. SIGNS AND SYMPTOMS   Redness and warmth.  Swelling.  Tenderness or pain.  Fever. DIAGNOSIS  Your health care provider can usually determine what is wrong based on a physical exam. Blood tests may also be done. TREATMENT  Treatment usually involves taking an antibiotic medicine. HOME CARE INSTRUCTIONS   Take your antibiotic medicine as directed by your health care provider. Finish the antibiotic even if you start to feel better.  Keep the infected arm or leg elevated to reduce swelling.  Apply a warm cloth to the affected area up to 4 times per day to relieve pain.  Take medicines only as directed by your health care provider.  Keep all follow-up visits as directed by your health care provider. SEEK MEDICAL CARE IF:   You notice red streaks coming from the infected area.  Your red area gets larger or turns dark in color.  Your bone or joint underneath the infected area becomes painful after the skin has healed.  Your infection returns in the same area or another area.  You notice a swollen bump in the infected area.  You develop new symptoms.  You have a fever. SEEK IMMEDIATE MEDICAL CARE IF:   You feel very sleepy.  You develop vomiting or diarrhea.  You have a general ill feeling (malaise) with muscle aches and pains. MAKE SURE YOU:   Understand these instructions.  Will  watch your condition.  Will get help right away if you are not doing well or get worse. Document Released: 10/28/2004 Document Revised: 06/04/2013 Document Reviewed: 04/05/2011 Exit[employer] Patient Information 2015 [employer]. This information is not intended to replace advice given to you by your health care provider. Make sure you discuss any questions you have with your health care provider.  

## 2014-09-22 ENCOUNTER — Other Ambulatory Visit: Payer: Self-pay | Admitting: Internal Medicine

## 2014-09-26 ENCOUNTER — Encounter: Payer: 59 | Admitting: Internal Medicine

## 2014-09-30 ENCOUNTER — Telehealth: Payer: Self-pay | Admitting: Internal Medicine

## 2014-09-30 MED ORDER — AMOXICILLIN-POT CLAVULANATE 875-125 MG PO TABS: 1.0000 | ORAL_TABLET | Freq: Two times a day (BID) | ORAL | Status: DC

## 2014-09-30 NOTE — Telephone Encounter (Signed)
Please see message and advise 

## 2014-09-30 NOTE — Telephone Encounter (Signed)
Pt seen 7/6 dx cellulitis right leg.  Pt states over the weekend it has come back, same exact place, same exact symptoms.  Would like to know if dr Raliegh Ip will call in the same med to   [facility] [address]  Or does pt need to be seen?

## 2014-09-30 NOTE — Telephone Encounter (Signed)
Augmentin 875, #20.  Generic one twice a day

## 2014-09-30 NOTE — Telephone Encounter (Signed)
Pt notified Rx sent to pharmacy

## 2014-10-28 ENCOUNTER — Other Ambulatory Visit: Payer: 59

## 2014-11-04 ENCOUNTER — Encounter: Payer: 59 | Admitting: Internal Medicine

## 2014-11-26 ENCOUNTER — Telehealth: Payer: Self-pay | Admitting: Internal Medicine

## 2014-11-26 MED ORDER — LEVOFLOXACIN 500 MG PO TABS: 500.0000 mg | ORAL_TABLET | Freq: Every day | ORAL | Status: DC

## 2014-11-26 NOTE — Telephone Encounter (Signed)
Please see message and advise 

## 2014-11-26 NOTE — Telephone Encounter (Signed)
Generic Levaquin 500 #10 one daily

## 2014-11-26 NOTE — Telephone Encounter (Signed)
Pt states he has cellulitis again (3rd - 4th time) in right knee. Pt would like to know if Dr Raliegh Ip will call in abx. Pt states last time the abx took several days to take effect, pt wants to know if higher dose might be needed or is this normal? pls advise  Pt states this has been happening after a day of activity, this time a day of golf.  [facility] [address]

## 2014-11-26 NOTE — Telephone Encounter (Signed)
Spoke to pt, told him Rx for Levaquin 500 mg one tablet daily x 10 days sent to pharmacy. Pt verbalized understanding.

## 2014-12-05 ENCOUNTER — Ambulatory Visit: Payer: 59 | Admitting: Cardiology

## 2014-12-06 ENCOUNTER — Other Ambulatory Visit: Payer: 59

## 2014-12-13 ENCOUNTER — Encounter: Payer: 59 | Admitting: Internal Medicine

## 2014-12-24 NOTE — Progress Notes (Signed)
      HPI: FU CAD; history of coronary artery disease, status post coronary artery bypass graft surgery. This was performed in June 2006. Patient had a LIMA to the LAD, saphenous vein graft to the diagonal, saphenous vein graft to the acute marginal and saphenous vein graft to the PDA. Echocardiogram in November of 2010 revealed an ejection fraction of 50-55%, moderate left atrial enlargement, and mild right atrial enlargement. Carotid Dopplers August 2015 showed no significant obstruction. Nuclear study August 2015 showed ejection fraction 55%. No ischemia or infarction. Since last seen,   Current Outpatient Prescriptions  Medication Sig Dispense Refill  . amoxicillin-clavulanate (AUGMENTIN) 875-125 MG per tablet Take 1 tablet by mouth 2 (two) times daily. 20 tablet 0  . aspirin (ASPIRIN CHILDRENS) 81 MG chewable tablet Chew 1 tablet (81 mg total) by mouth daily.    Marland Kitchen atorvastatin (LIPITOR) 80 MG tablet TAKE 1 TABLET BY MOUTH EVERY DAY 90 tablet 3  . levofloxacin (LEVAQUIN) 500 MG tablet Take 1 tablet (500 mg total) by mouth daily. 10 tablet 0  . losartan (COZAAR) 100 MG tablet TAKE 1 TABLET BY MOUTH EVERY DAY 90 tablet 3  . metoprolol succinate (TOPROL-XL) 50 MG 24 hr tablet TAKE 1 TABLET BY MOUTH EVERY DAY 90 tablet 3  . tamsulosin (FLOMAX) 0.4 MG CAPS capsule TAKE ONE CAPSULE BY MOUTH EVERY DAY AFTER SUPPER 90 capsule 3   No current facility-administered medications for this visit.     Past Medical History  Diagnosis Date  . Adjustment disorder with depressed mood 09/07/2007  . CONGESTIVE HEART FAILURE 12/09/2008  . CORONARY ARTERY DISEASE 08/09/2006  . HYPERLIPIDEMIA 08/09/2006  . HYPERTENSION 08/09/2006  . MYOCARDIAL INFARCTION, HX OF 08/09/2006  . 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 OBESITY 07/19/2008  . Colon polyps     Tubular Adenoma 2010    Past Surgical History  Procedure Laterality Date  . Coronary artery bypass graft      Social History   Social History  . Marital Status:  Single    Spouse Name: N/A  . Number of Children: N/A  . Years of Education: N/A   Occupational History  . Not on file.   Social History Main Topics  . Smoking status: Never Smoker   . Smokeless tobacco: Never Used  . Alcohol Use: No  . Drug Use: No  . Sexual Activity: Not on file   Other Topics Concern  . Not on file   Social History Narrative    ROS: no fevers or chills, productive cough, hemoptysis, dysphasia, odynophagia, melena, hematochezia, dysuria, hematuria, rash, seizure activity, orthopnea, PND, pedal edema, claudication. Remaining systems are negative.  Physical Exam: Well-developed well-nourished in no acute distress.  Skin is warm and dry.  HEENT is normal.  Neck is supple.  Chest is clear to auscultation with normal expansion.  Cardiovascular exam is regular rate and rhythm.  Abdominal exam nontender or distended. No masses palpated. Extremities show no edema. neuro grossly intact  ECG     This encounter was created in error - please disregard.

## 2014-12-31 ENCOUNTER — Other Ambulatory Visit: Payer: 59

## 2015-01-03 ENCOUNTER — Encounter: Payer: 59 | Admitting: Cardiology

## 2015-01-06 ENCOUNTER — Encounter: Payer: 59 | Admitting: Internal Medicine

## 2015-02-20 ENCOUNTER — Ambulatory Visit: Payer: 59 | Admitting: Cardiology

## 2015-03-03 ENCOUNTER — Ambulatory Visit: Payer: 59 | Admitting: Cardiology

## 2015-03-04 ENCOUNTER — Other Ambulatory Visit: Payer: 59

## 2015-03-10 ENCOUNTER — Encounter: Payer: 59 | Admitting: Internal Medicine

## 2015-04-15 ENCOUNTER — Other Ambulatory Visit: Payer: 59

## 2015-04-17 ENCOUNTER — Ambulatory Visit: Payer: 59 | Admitting: Cardiology

## 2015-04-21 ENCOUNTER — Encounter: Payer: 59 | Admitting: Internal Medicine

## 2015-05-23 ENCOUNTER — Ambulatory Visit: Payer: 59 | Admitting: Cardiology

## 2015-06-25 NOTE — Progress Notes (Signed)
HPI: FU CAD; history of coronary artery disease, status post coronary artery bypass graft surgery. This was performed in June 2006. Patient had a LIMA to the LAD, saphenous vein graft to the diagonal, saphenous vein graft to the acute marginal and saphenous vein graft to the PDA. Echocardiogram in November of 2010 revealed an ejection fraction of 50-55%, moderate left atrial enlargement, and mild right atrial enlargement.  Carotid Dopplers August 2015 showed no significant stenosis. Nuclear study August 2015 showed ejection fraction 55% but no ischemia or infarction. Possible mild decrease in systolic blood pressure at peak exercise. Since last seen the patient has dyspnea with more extreme activities but not with routine activities. It is relieved with rest. It is not associated with chest pain. There is no orthopnea, PND. There is no syncope or palpitations. There is no exertional chest pain. Mild pedal edema.   Current Outpatient Prescriptions  Medication Sig Dispense Refill  . aspirin (ASPIRIN CHILDRENS) 81 MG chewable tablet Chew 1 tablet (81 mg total) by mouth daily.    Marland Kitchen atorvastatin (LIPITOR) 80 MG tablet TAKE 1 TABLET BY MOUTH EVERY DAY 90 tablet 3  . levofloxacin (LEVAQUIN) 500 MG tablet Take 1 tablet (500 mg total) by mouth daily. 10 tablet 0  . losartan (COZAAR) 100 MG tablet TAKE 1 TABLET BY MOUTH EVERY DAY 90 tablet 3  . metoprolol succinate (TOPROL-XL) 50 MG 24 hr tablet TAKE 1 TABLET BY MOUTH EVERY DAY 90 tablet 3  . tamsulosin (FLOMAX) 0.4 MG CAPS capsule TAKE ONE CAPSULE BY MOUTH EVERY DAY AFTER SUPPER 90 capsule 3   No current facility-administered medications for this visit.     Past Medical History  Diagnosis Date  . Adjustment disorder with depressed mood 09/07/2007  . CONGESTIVE HEART FAILURE 12/09/2008  . CORONARY ARTERY DISEASE 08/09/2006  . HYPERLIPIDEMIA 08/09/2006  . HYPERTENSION 08/09/2006  . MYOCARDIAL INFARCTION, HX OF 08/09/2006  . NEPHROLITHIASIS, HX OF  08/09/2006  . OBESITY 07/19/2008  . Colon polyps     Tubular Adenoma 2010    Past Surgical History  Procedure Laterality Date  . Coronary artery bypass graft      Social History   Social History  . Marital Status: Single    Spouse Name: N/A  . Number of Children: N/A  . Years of Education: N/A   Occupational History  . Not on file.   Social History Main Topics  . Smoking status: Never Smoker   . Smokeless tobacco: Never Used  . Alcohol Use: No  . Drug Use: No  . Sexual Activity: Not on file   Other Topics Concern  . Not on file   Social History Narrative    Family History  Problem Relation Age of Onset  . Hypertension Mother   . Hyperlipidemia Mother   . Heart disease Father   . Ulcers Father     ROS: no fevers or chills, productive cough, hemoptysis, dysphasia, odynophagia, melena, hematochezia, dysuria, hematuria, rash, seizure activity, orthopnea, PND, claudication. Remaining systems are negative.  Physical Exam: Well-developed obese in no acute distress.  Skin is warm and dry.  HEENT is normal.  Neck is supple.  Chest is clear to auscultation with normal expansion.  Cardiovascular exam is regular rate and rhythm. 2/6 systolic murmur Abdominal exam nontender or distended. No masses palpated. Extremities show trace to 1+ edema. neuro grossly intact  ECG Sinus rhythm at a rate of 70. Left axis deviation. First-degree AV block. Left ventricular hypertrophy. No ST  changes.

## 2015-06-26 ENCOUNTER — Ambulatory Visit (INDEPENDENT_AMBULATORY_CARE_PROVIDER_SITE_OTHER): Payer: 59 | Admitting: Cardiology

## 2015-06-26 ENCOUNTER — Encounter: Payer: Self-pay | Admitting: Cardiology

## 2015-06-26 ENCOUNTER — Encounter: Payer: Self-pay | Admitting: *Deleted

## 2015-06-26 VITALS — BP 119/66 | HR 70 | Ht 66.0 in | Wt 252.2 lb

## 2015-06-26 DIAGNOSIS — R0989 Other specified symptoms and signs involving the circulatory and respiratory systems: Secondary | ICD-10-CM | POA: Diagnosis not present

## 2015-06-26 DIAGNOSIS — R011 Cardiac murmur, unspecified: Secondary | ICD-10-CM

## 2015-06-26 DIAGNOSIS — I25810 Atherosclerosis of coronary artery bypass graft(s) without angina pectoris: Secondary | ICD-10-CM | POA: Diagnosis not present

## 2015-06-26 NOTE — Assessment & Plan Note (Addendum)
Possible aortic stenosis murmur. Schedule Echocardiogram.

## 2015-06-26 NOTE — Assessment & Plan Note (Signed)
Blood pressure controlled. Continue present medications. 

## 2015-06-26 NOTE — Patient Instructions (Signed)
Medication Instructions:   NO CHANGE  Testing/Procedures:  Your physician has requested that you have an echocardiogram. Echocardiography is a painless test that uses sound waves to create images of your heart. It provides your doctor with information about the size and shape of your heart and how well your heart's chambers and valves are working. This procedure takes approximately one hour. There are no restrictions for this procedure.   Your physician has requested that you have an abdominal aorta duplex. During this test, an ultrasound is used to evaluate the aorta. Allow 30 minutes for this exam. Do not eat after midnight the day before and avoid carbonated beverages   Follow-Up:  Your physician wants you to follow-up in: ONE YEAR WITH DR CRENSHAW You will receive a reminder letter in the mail two months in advance. If you don't receive a letter, please call our office to schedule the follow-up appointment.   If you need a refill on your cardiac medications before your next appointment, please call your pharmacy.  

## 2015-06-26 NOTE — Assessment & Plan Note (Signed)
Continue aspirin and statin. 

## 2015-06-26 NOTE — Assessment & Plan Note (Signed)
Scheduled abdominal ultrasound to exclude aneurysm. 

## 2015-06-26 NOTE — Assessment & Plan Note (Signed)
Continue statin. Lipids and liver monitored by primary care. 

## 2015-07-03 ENCOUNTER — Ambulatory Visit (HOSPITAL_COMMUNITY)
Admission: RE | Admit: 2015-07-03 | Discharge: 2015-07-03 | Disposition: A | Discharge status: Home / Self Care | Payer: 59 | Source: Ambulatory Visit | Attending: Cardiology | Admitting: Cardiology

## 2015-07-03 DIAGNOSIS — I110 Hypertensive heart disease with heart failure: Secondary | ICD-10-CM | POA: Diagnosis not present

## 2015-07-03 DIAGNOSIS — I2510 Atherosclerotic heart disease of native coronary artery without angina pectoris: Secondary | ICD-10-CM | POA: Diagnosis not present

## 2015-07-03 DIAGNOSIS — R0989 Other specified symptoms and signs involving the circulatory and respiratory systems: Secondary | ICD-10-CM | POA: Diagnosis present

## 2015-07-03 DIAGNOSIS — E785 Hyperlipidemia, unspecified: Secondary | ICD-10-CM | POA: Insufficient documentation

## 2015-07-03 DIAGNOSIS — I509 Heart failure, unspecified: Secondary | ICD-10-CM | POA: Diagnosis not present

## 2015-07-11 ENCOUNTER — Other Ambulatory Visit (INDEPENDENT_AMBULATORY_CARE_PROVIDER_SITE_OTHER): Payer: 59

## 2015-07-11 DIAGNOSIS — Z0000 Encounter for general adult medical examination without abnormal findings: Secondary | ICD-10-CM

## 2015-07-11 LAB — POC URINALSYSI DIPSTICK (AUTOMATED)
Bilirubin, UA: NEGATIVE
Blood, UA: NEGATIVE
Glucose, UA: NEGATIVE
Ketones, UA: NEGATIVE
Leukocytes, UA: NEGATIVE
Nitrite, UA: NEGATIVE
Protein, UA: NEGATIVE
Spec Grav, UA: 1.015
Urobilinogen, UA: 4
pH, UA: 6

## 2015-07-11 LAB — CBC WITH DIFFERENTIAL/PLATELET
Basophils Absolute: 0 10*3/uL (ref 0.0–0.1)
Basophils Relative: 0.5 % (ref 0.0–3.0)
Eosinophils Absolute: 0.1 10*3/uL (ref 0.0–0.7)
Eosinophils Relative: 2.5 % (ref 0.0–5.0)
HCT: 37.6 % — ABNORMAL LOW (ref 39.0–52.0)
Hemoglobin: 12.7 g/dL — ABNORMAL LOW (ref 13.0–17.0)
Lymphocytes Relative: 31.5 % (ref 12.0–46.0)
Lymphs Abs: 1.8 10*3/uL (ref 0.7–4.0)
MCHC: 33.8 g/dL (ref 30.0–36.0)
MCV: 90.8 fl (ref 78.0–100.0)
Monocytes Absolute: 0.4 10*3/uL (ref 0.1–1.0)
Monocytes Relative: 7 % (ref 3.0–12.0)
Neutro Abs: 3.3 10*3/uL (ref 1.4–7.7)
Neutrophils Relative %: 58.5 % (ref 43.0–77.0)
Platelets: 119 10*3/uL — ABNORMAL LOW (ref 150.0–400.0)
RBC: 4.14 Mil/uL — ABNORMAL LOW (ref 4.22–5.81)
RDW: 14.6 % (ref 11.5–15.5)
WBC: 5.6 10*3/uL (ref 4.0–10.5)

## 2015-07-11 LAB — PSA: PSA: 0.2 ng/mL (ref 0.10–4.00)

## 2015-07-11 LAB — BASIC METABOLIC PANEL
BUN: 21 mg/dL (ref 6–23)
CO2: 27 mEq/L (ref 19–32)
Calcium: 8.8 mg/dL (ref 8.4–10.5)
Chloride: 103 mEq/L (ref 96–112)
Creatinine, Ser: 0.82 mg/dL (ref 0.40–1.50)
GFR: 99.99 mL/min (ref >60.00)
Glucose, Bld: 124 mg/dL — ABNORMAL HIGH (ref 70–99)
Potassium: 4.4 mEq/L (ref 3.5–5.1)
Sodium: 137 mEq/L (ref 135–145)

## 2015-07-11 LAB — LIPID PANEL
Cholesterol: 125 mg/dL (ref 0–200)
HDL: 30.2 mg/dL — ABNORMAL LOW (ref >39.00)
LDL Cholesterol: 60 mg/dL (ref 0–99)
NonHDL: 94.66
Total CHOL/HDL Ratio: 4
Triglycerides: 171 mg/dL — ABNORMAL HIGH (ref 0.0–149.0)
VLDL: 34.2 mg/dL (ref 0.0–40.0)

## 2015-07-11 LAB — HEPATIC FUNCTION PANEL
ALT: 26 U/L (ref 0–53)
AST: 42 U/L — ABNORMAL HIGH (ref 0–37)
Albumin: 3.7 g/dL (ref 3.5–5.2)
Alkaline Phosphatase: 59 U/L (ref 39–117)
Bilirubin, Direct: 0.3 mg/dL (ref 0.0–0.3)
Total Bilirubin: 1 mg/dL (ref 0.2–1.2)
Total Protein: 6.7 g/dL (ref 6.0–8.3)

## 2015-07-11 LAB — TSH: TSH: 2.36 u[IU]/mL (ref 0.35–4.50)

## 2015-07-14 ENCOUNTER — Ambulatory Visit (HOSPITAL_COMMUNITY): Payer: 59 | Attending: Cardiovascular Disease

## 2015-07-14 ENCOUNTER — Other Ambulatory Visit: Payer: Self-pay

## 2015-07-14 DIAGNOSIS — E785 Hyperlipidemia, unspecified: Secondary | ICD-10-CM | POA: Insufficient documentation

## 2015-07-14 DIAGNOSIS — I110 Hypertensive heart disease with heart failure: Secondary | ICD-10-CM | POA: Diagnosis not present

## 2015-07-14 DIAGNOSIS — I2510 Atherosclerotic heart disease of native coronary artery without angina pectoris: Secondary | ICD-10-CM | POA: Diagnosis not present

## 2015-07-14 DIAGNOSIS — I509 Heart failure, unspecified: Secondary | ICD-10-CM | POA: Diagnosis not present

## 2015-07-14 DIAGNOSIS — I059 Rheumatic mitral valve disease, unspecified: Secondary | ICD-10-CM | POA: Diagnosis not present

## 2015-07-14 DIAGNOSIS — I252 Old myocardial infarction: Secondary | ICD-10-CM | POA: Diagnosis not present

## 2015-07-14 DIAGNOSIS — I350 Nonrheumatic aortic (valve) stenosis: Secondary | ICD-10-CM | POA: Diagnosis not present

## 2015-07-14 DIAGNOSIS — R011 Cardiac murmur, unspecified: Secondary | ICD-10-CM

## 2015-07-14 LAB — ECHOCARDIOGRAM COMPLETE
AO mean calculated velocity dopler: 189 cm/s
AV Area VTI index: 0.63 cm2/m2
AV Area VTI: 1.29 cm2
AV Area mean vel: 1.45 cm2
AV Mean grad: 17 mmHg
AV Peak grad: 34 mmHg
AV VEL mean LVOT/AV: 0.32
AV area mean vel ind: 0.62 cm2/m2
AV peak Index: 0.55
AV pk vel: 293 cm/s
AV vel: 1.49
Ao pk vel: 0.28 m/s
Ao-asc: 32 cm
E decel time: 257 msec
E/e' ratio: 15.89
FS: 29 % (ref 28–44)
IVS/LV PW RATIO, ED: 1.19
LA ID, A-P, ES: 49 mm
LA diam end sys: 49 mm
LA diam index: 2.07 cm/m2
LA vol A4C: 85 ml
LA vol index: 36.8 mL/m2
LA vol: 87 mL
LV E/e' medial: 15.89
LV E/e'average: 15.89
LV PW d: 12.2 mm — AB (ref 0.6–1.1)
LV dias vol index: 55 mL/m2
LV dias vol: 129 mL (ref 62–150)
LV e' LATERAL: 7.99 cm/s
LV sys vol index: 20 mL/m2
LV sys vol: 48 mL (ref 21–61)
LVOT SV: 93 mL
LVOT VTI: 20.5 cm
LVOT area: 4.52 cm2
LVOT diameter: 24 mm
LVOT peak VTI: 0.33 cm
LVOT peak grad rest: 3 mmHg
LVOT peak vel: 83.5 cm/s
Lateral S' vel: 8.04 cm/s
MV Dec: 257
MV Peak grad: 6 mmHg
MV pk A vel: 80 m/s
MV pk E vel: 127 m/s
RV sys press: 29 mmHg
Reg peak vel: 256 cm/s
Simpson's disk: 63
Stroke v: 81 ml
TDI e' lateral: 7.99
TDI e' medial: 6.18
TR max vel: 256 cm/s
VTI: 62.2 cm
Valve area index: 0.63
Valve area: 1.49 cm2

## 2015-07-18 ENCOUNTER — Encounter: Payer: Self-pay | Admitting: Gastroenterology

## 2015-07-18 ENCOUNTER — Ambulatory Visit (INDEPENDENT_AMBULATORY_CARE_PROVIDER_SITE_OTHER): Payer: 59 | Admitting: Internal Medicine

## 2015-07-18 ENCOUNTER — Encounter: Payer: Self-pay | Admitting: Internal Medicine

## 2015-07-18 VITALS — BP 110/70 | HR 64 | Temp 98.4°F | Ht 64.25 in | Wt 251.0 lb

## 2015-07-18 DIAGNOSIS — I10 Essential (primary) hypertension: Secondary | ICD-10-CM

## 2015-07-18 DIAGNOSIS — Z860100 Personal history of colon polyps, unspecified: Secondary | ICD-10-CM

## 2015-07-18 DIAGNOSIS — E785 Hyperlipidemia, unspecified: Secondary | ICD-10-CM

## 2015-07-18 DIAGNOSIS — I2510 Atherosclerotic heart disease of native coronary artery without angina pectoris: Secondary | ICD-10-CM

## 2015-07-18 DIAGNOSIS — N528 Other male erectile dysfunction: Secondary | ICD-10-CM

## 2015-07-18 DIAGNOSIS — Z0000 Encounter for general adult medical examination without abnormal findings: Secondary | ICD-10-CM

## 2015-07-18 NOTE — Patient Instructions (Signed)
Limit your sodium (Salt) intake    It is important that you exercise regularly, at least 20 minutes 3 to 4 times per week.  If you develop chest pain or shortness of breath seek  medical attention.  Schedule your colonoscopy to help detect colon cancer.  You need to lose weight.  Consider a lower calorie diet and regular exercise.

## 2015-07-18 NOTE — Progress Notes (Signed)
Subjective:    Patient ID: [name], male    DOB: 03/06/1950, 65 y.o.   MRN: [id number]  HPI   Wt Readings from Last 3 Encounters:  07/18/15 251 lb (113.853 kg)  06/26/15 252 lb 3.2 oz (114.397 kg)  08/07/14 260 lb (117.62 kg)   48 -year-old patient who is seen today for a preventive health examination.  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 He has been seen by cardiology recently and had a 2-D echo cardiogram as well as screening for AAA Carotid vascular risk factors include hypertension and dyslipidemia.  He has obesity Additionally, he has a history of impaired glucose tolerance over the past several years.  Slight worsening of thrombocytopenia. He has a history of colonic polyps, but last colonoscopy was September 2010 Laboratory review revealed mild anemia, hemoglobin 12 point 7 g percent Weight is down modestly over the past year.  He generally feels well He still works full-time  He complains of ED as well as libido issues  Past Medical History  Diagnosis Date  . Adjustment disorder with depressed mood 09/07/2007  . CONGESTIVE HEART FAILURE 12/09/2008  . CORONARY ARTERY DISEASE 08/09/2006  . HYPERLIPIDEMIA 08/09/2006  . HYPERTENSION 08/09/2006  . MYOCARDIAL INFARCTION, HX OF 08/09/2006  . 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 OBESITY 07/19/2008  . Colon polyps     Tubular Adenoma 2010    Social History   Social History  . Marital Status: Single    Spouse Name: N/A  . Number of Children: N/A  . Years of Education: N/A   Occupational History  . Not on file.   Social History Main Topics  . Smoking status: Never Smoker   . Smokeless tobacco: Never Used  . Alcohol Use: No  . Drug Use: No  . Sexual Activity: Not on file   Other Topics Concern  . Not on file   Social History Narrative    Past Surgical History  Procedure Laterality Date  . Coronary artery bypass graft      Family History  Problem Relation Age of Onset  . Hypertension Mother   .  Hyperlipidemia Mother   . Heart disease Father   . Ulcers Father     No Known Allergies  Current Outpatient Prescriptions on File Prior to Visit  Medication Sig Dispense Refill  . aspirin (ASPIRIN CHILDRENS) 81 MG chewable tablet Chew 1 tablet (81 mg total) by mouth daily.    Marland Kitchen atorvastatin (LIPITOR) 80 MG tablet TAKE 1 TABLET BY MOUTH EVERY DAY 90 tablet 3  . losartan (COZAAR) 100 MG tablet TAKE 1 TABLET BY MOUTH EVERY DAY 90 tablet 3  . metoprolol succinate (TOPROL-XL) 50 MG 24 hr tablet TAKE 1 TABLET BY MOUTH EVERY DAY 90 tablet 3  . tamsulosin (FLOMAX) 0.4 MG CAPS capsule TAKE ONE CAPSULE BY MOUTH EVERY DAY AFTER SUPPER 90 capsule 3   No current facility-administered medications on file prior to visit.    BP 110/70 mmHg  Pulse 64  Temp(Src) 98.4 F (36.9 C) (Oral)  Ht 5' 4.25" (1.632 m)  Wt 251 lb (113.853 kg)  BMI 42.75 kg/m2     Review of Systems  Constitutional: Negative for fever, chills, activity change, appetite change and fatigue.  HENT: Negative for congestion, dental problem, ear pain, hearing loss, mouth sores, rhinorrhea, sinus pressure, sneezing, tinnitus, trouble swallowing and voice change.   Eyes: Negative for photophobia, pain, redness and visual disturbance.  Respiratory: Negative for apnea, cough, choking, chest tightness,  shortness of breath and wheezing.   Cardiovascular: Negative for chest pain, palpitations and leg swelling.  Gastrointestinal: Negative for nausea, vomiting, abdominal pain, diarrhea, constipation, blood in stool, abdominal distention, anal bleeding and rectal pain.  Genitourinary: Negative for dysuria, urgency, frequency, hematuria, flank pain, decreased urine volume, discharge, penile swelling, scrotal swelling, difficulty urinating, genital sores and testicular pain.  Musculoskeletal: Negative for myalgias, back pain, joint swelling, arthralgias, gait problem, neck pain and neck stiffness.  Skin: Negative for color change, rash and  wound.  Neurological: Negative for dizziness, tremors, seizures, syncope, facial asymmetry, speech difficulty, weakness, light-headedness, numbness and headaches.  Hematological: Negative for adenopathy. Does not bruise/bleed easily.  Psychiatric/Behavioral: Negative for suicidal ideas, hallucinations, behavioral problems, confusion, sleep disturbance, self-injury, dysphoric mood, decreased concentration and agitation. The patient is not nervous/anxious.        Objective:   Physical Exam  Constitutional: He appears well-developed and well-nourished.  HENT:  Head: Normocephalic and atraumatic.  Right Ear: External ear normal.  Left Ear: External ear normal.  Nose: Nose normal.  Mouth/Throat: Oropharynx is clear and moist.  Low hanging soft palate  Eyes: Conjunctivae and EOM are normal. Pupils are equal, round, and reactive to light. No scleral icterus.  Neck: Normal range of motion. Neck supple. No JVD present. No thyromegaly present.  Cardiovascular: Regular rhythm, normal heart sounds and intact distal pulses.  Exam reveals no gallop and no friction rub.   No murmur heard. Pulmonary/Chest: Effort normal and breath sounds normal. He exhibits no tenderness.  Sternotomy scar  Abdominal: Soft. Bowel sounds are normal. He exhibits no distension and no mass. There is no tenderness.  Genitourinary: Prostate normal and penis normal. Guaiac negative stool.  Intertriginous rash in the groin  Musculoskeletal: Normal range of motion. He exhibits edema. He exhibits no tenderness.  Lymphadenopathy:    He has no cervical adenopathy.  Neurological: He is alert. He has normal reflexes. No cranial nerve deficit. Coordination normal.  Skin: Skin is warm and dry. No rash noted.  Erythematous intertriginous rash of the groin  Psychiatric: He has a normal mood and affect. His behavior is normal.          Assessment & Plan:   Preventive health examination Impaired glucose tolerance.  Weight loss,  exercise regimen, heart healthy diet all encouraged Exogenous obesity Thrombocytopenia - Stable and unchanged  Coronary artery disease.  Asymptomatic Dyslipidemia.  Continue statin therapy Colonic polyps.  Will schedule follow-up colonoscopy

## 2015-08-01 ENCOUNTER — Other Ambulatory Visit (INDEPENDENT_AMBULATORY_CARE_PROVIDER_SITE_OTHER): Payer: 59

## 2015-08-01 DIAGNOSIS — Z0000 Encounter for general adult medical examination without abnormal findings: Secondary | ICD-10-CM | POA: Diagnosis not present

## 2015-08-01 DIAGNOSIS — E291 Testicular hypofunction: Secondary | ICD-10-CM | POA: Diagnosis not present

## 2015-08-01 DIAGNOSIS — E349 Endocrine disorder, unspecified: Secondary | ICD-10-CM

## 2015-08-01 LAB — BASIC METABOLIC PANEL
BUN: 20 mg/dL (ref 6–23)
CO2: 29 mEq/L (ref 19–32)
Calcium: 9.4 mg/dL (ref 8.4–10.5)
Chloride: 104 mEq/L (ref 96–112)
Creatinine, Ser: 1.08 mg/dL (ref 0.40–1.50)
GFR: 72.75 mL/min (ref >60.00)
Glucose, Bld: 146 mg/dL — ABNORMAL HIGH (ref 70–99)
Potassium: 4.5 mEq/L (ref 3.5–5.1)
Sodium: 139 mEq/L (ref 135–145)

## 2015-08-01 LAB — HEPATIC FUNCTION PANEL
ALT: 22 U/L (ref 0–53)
AST: 29 U/L (ref 0–37)
Albumin: 3.7 g/dL (ref 3.5–5.2)
Alkaline Phosphatase: 64 U/L (ref 39–117)
Bilirubin, Direct: 0.2 mg/dL (ref 0.0–0.3)
Total Bilirubin: 1 mg/dL (ref 0.2–1.2)
Total Protein: 6.7 g/dL (ref 6.0–8.3)

## 2015-08-01 LAB — CBC WITH DIFFERENTIAL/PLATELET
Basophils Absolute: 0 10*3/uL (ref 0.0–0.1)
Basophils Relative: 0.4 % (ref 0.0–3.0)
Eosinophils Absolute: 0.1 10*3/uL (ref 0.0–0.7)
Eosinophils Relative: 2.5 % (ref 0.0–5.0)
HCT: 37.7 % — ABNORMAL LOW (ref 39.0–52.0)
Hemoglobin: 12.8 g/dL — ABNORMAL LOW (ref 13.0–17.0)
Lymphocytes Relative: 27.9 % (ref 12.0–46.0)
Lymphs Abs: 1.3 10*3/uL (ref 0.7–4.0)
MCHC: 33.9 g/dL (ref 30.0–36.0)
MCV: 91.2 fl (ref 78.0–100.0)
Monocytes Absolute: 0.3 10*3/uL (ref 0.1–1.0)
Monocytes Relative: 7.4 % (ref 3.0–12.0)
Neutro Abs: 2.8 10*3/uL (ref 1.4–7.7)
Neutrophils Relative %: 61.8 % (ref 43.0–77.0)
Platelets: 86 10*3/uL — ABNORMAL LOW (ref 150.0–400.0)
RBC: 4.13 Mil/uL — ABNORMAL LOW (ref 4.22–5.81)
RDW: 14.6 % (ref 11.5–15.5)
WBC: 4.5 10*3/uL (ref 4.0–10.5)

## 2015-08-01 LAB — POC URINALSYSI DIPSTICK (AUTOMATED)
Bilirubin, UA: NEGATIVE
Blood, UA: NEGATIVE
Glucose, UA: NEGATIVE
Ketones, UA: NEGATIVE
Leukocytes, UA: NEGATIVE
Nitrite, UA: NEGATIVE
Protein, UA: NEGATIVE
Spec Grav, UA: 1.015
Urobilinogen, UA: 0.2
pH, UA: 5.5

## 2015-08-01 LAB — TESTOSTERONE: Testosterone: 660.82 ng/dL (ref 300.00–890.00)

## 2015-08-01 LAB — LIPID PANEL
Cholesterol: 145 mg/dL (ref 0–200)
HDL: 35.6 mg/dL — ABNORMAL LOW (ref >39.00)
LDL Cholesterol: 86 mg/dL (ref 0–99)
NonHDL: 108.93
Total CHOL/HDL Ratio: 4
Triglycerides: 113 mg/dL (ref 0.0–149.0)
VLDL: 22.6 mg/dL (ref 0.0–40.0)

## 2015-08-01 LAB — TSH: TSH: 2.8 u[IU]/mL (ref 0.35–4.50)

## 2015-08-01 LAB — PSA: PSA: 0.24 ng/mL (ref 0.10–4.00)

## 2015-09-10 ENCOUNTER — Ambulatory Visit (AMBULATORY_SURGERY_CENTER): Payer: Self-pay

## 2015-09-10 VITALS — Ht 66.0 in | Wt 249.4 lb

## 2015-09-10 DIAGNOSIS — Z860100 Personal history of colon polyps, unspecified: Secondary | ICD-10-CM

## 2015-09-10 DIAGNOSIS — Z1211 Encounter for screening for malignant neoplasm of colon: Secondary | ICD-10-CM

## 2015-09-10 MED ORDER — SUPREP BOWEL PREP KIT 17.5-3.13-1.6 GM/177ML PO SOLN: 1.0000 | Freq: Once | ORAL | 0 refills | Status: AC

## 2015-09-10 NOTE — Progress Notes (Signed)
No allergies to eggs or soy No past problems with anesthesia No diet meds no home oxygen  Declined emmi 

## 2015-09-11 ENCOUNTER — Encounter: Payer: Self-pay | Admitting: Gastroenterology

## 2015-09-12 ENCOUNTER — Telehealth: Payer: Self-pay | Admitting: Emergency Medicine

## 2015-09-12 NOTE — Telephone Encounter (Signed)
Pt seen in office on 07/18/15.  Labs were placed for testosterone   Testosterone Levels 660.82   Pt states that he has been given ED meds previously  No medications on are on medication list. Pt requesting rx for ED

## 2015-09-12 NOTE — Telephone Encounter (Signed)
Viagra 100, #6 One half tablet daily as needed, refill times 6

## 2015-09-12 NOTE — Telephone Encounter (Signed)
Pt states was seen 3 weeks ago and received labs to check testosterone. Had been given meds previously; has not received lab results. Pt continues to have problems with ED and is requesting rx to be called in.

## 2015-09-15 MED ORDER — SILDENAFIL CITRATE 100 MG PO TABS: ORAL_TABLET | ORAL | 6 refills | Status: DC

## 2015-09-15 NOTE — Telephone Encounter (Signed)
Rx sent to pharmacy   

## 2015-09-15 NOTE — Addendum Note (Signed)
Addended by: Kateri Mc E on: 09/15/2015 04:47 PM   Modules accepted: Orders

## 2015-09-17 ENCOUNTER — Other Ambulatory Visit: Payer: Self-pay | Admitting: Internal Medicine

## 2015-09-24 ENCOUNTER — Ambulatory Visit (AMBULATORY_SURGERY_CENTER): Payer: 59 | Admitting: Gastroenterology

## 2015-09-24 ENCOUNTER — Encounter: Payer: Self-pay | Admitting: Gastroenterology

## 2015-09-24 VITALS — BP 117/52 | HR 66 | Temp 99.6°F | Resp 15 | Ht 66.0 in | Wt 249.0 lb

## 2015-09-24 DIAGNOSIS — D122 Benign neoplasm of ascending colon: Secondary | ICD-10-CM

## 2015-09-24 DIAGNOSIS — D120 Benign neoplasm of cecum: Secondary | ICD-10-CM

## 2015-09-24 DIAGNOSIS — D125 Benign neoplasm of sigmoid colon: Secondary | ICD-10-CM

## 2015-09-24 DIAGNOSIS — Z86010 Personal history of colonic polyps: Secondary | ICD-10-CM | POA: Diagnosis present

## 2015-09-24 DIAGNOSIS — D123 Benign neoplasm of transverse colon: Secondary | ICD-10-CM | POA: Diagnosis not present

## 2015-09-24 DIAGNOSIS — D127 Benign neoplasm of rectosigmoid junction: Secondary | ICD-10-CM

## 2015-09-24 DIAGNOSIS — D124 Benign neoplasm of descending colon: Secondary | ICD-10-CM | POA: Diagnosis not present

## 2015-09-24 MED ORDER — SODIUM CHLORIDE 0.9 % IV SOLN: 500.0000 mL | INTRAVENOUS | Status: DC

## 2015-09-24 NOTE — Patient Instructions (Signed)
Impression/recommendations:  Polyps (handout given) Diverticulosis (handout given) High Fiber Diet (handout given) Hemorrhoids (handout given)  No ibuprofen, naproxen or other non-steroidal anti-inflammatory medications for two weeks. Tylenol only if needed until 10/09/15.  YOU HAD AN ENDOSCOPIC PROCEDURE TODAY AT [facility]:   Refer to the procedure report that was given to you for any specific questions about what was found during the examination.  If the procedure report does not answer your questions, please call your gastroenterologist to clarify.  If you requested that your care partner not be given the details of your procedure findings, then the procedure report has been included in a sealed envelope for you to review at your convenience later.  YOU SHOULD EXPECT: Some feelings of bloating in the abdomen. Passage of more gas than usual.  Walking can help get rid of the air that was put into your GI tract during the procedure and reduce the bloating. If you had a lower endoscopy (such as a colonoscopy or flexible sigmoidoscopy) you may notice spotting of blood in your stool or on the toilet paper. If you underwent a bowel prep for your procedure, you may not have a normal bowel movement for a few days.  Please Note:  You might notice some irritation and congestion in your nose or some drainage.  This is from the oxygen used during your procedure.  There is no need for concern and it should clear up in a day or so.  SYMPTOMS TO REPORT IMMEDIATELY:   Following lower endoscopy (colonoscopy or flexible sigmoidoscopy):  Excessive amounts of blood in the stool  Significant tenderness or worsening of abdominal pains  Swelling of the abdomen that is new, acute  Fever of 100F or higher  For urgent or emergent issues, a gastroenterologist can be reached at any hour by calling [phone].   DIET:  We do recommend a small meal at first, but then you may proceed to your  regular diet.  Drink plenty of fluids but you should avoid alcoholic beverages for 24 hours.  ACTIVITY:  You should plan to take it easy for the rest of today and you should NOT DRIVE or use heavy machinery until tomorrow (because of the sedation medicines used during the test).    FOLLOW UP: Our staff will call the number listed on your records the next business day following your procedure to check on you and address any questions or concerns that you may have regarding the information given to you following your procedure. If we do not reach you, we will leave a message.  However, if you are feeling well and you are not experiencing any problems, there is no need to return our call.  We will assume that you have returned to your regular daily activities without incident.  If any biopsies were taken you will be contacted by phone or by letter within the next 1-3 weeks.  Please call us at [phone] if you have not heard about the biopsies in 3 weeks.    SIGNATURES/CONFIDENTIALITY: You and/or your care partner have signed paperwork which will be entered into your electronic medical record.  These signatures attest to the fact that that the information above on your After Visit Summary has been reviewed and is understood.  Full responsibility of the confidentiality of this discharge information lies with you and/or your care-partner.

## 2015-09-24 NOTE — Progress Notes (Signed)
Called to room to assist during endoscopic procedure.  Patient ID and intended procedure confirmed with present staff. Received instructions for my participation in the procedure from the performing physician.  

## 2015-09-24 NOTE — Op Note (Signed)
[facility] Patient Name: [name] Procedure Date: 09/24/2015 11:33 AM MRN: [id number] Endoscopist: Remo Lipps P. Havery Moros , MD Age: 65 Referring MD:  Date of Birth: Dec 15, 1950 Gender: Male Account #: [id number] Procedure:                Colonoscopy Indications:              High risk colon cancer surveillance: Personal                            history of colonic polyps, last colonoscopy in 2010 Medicines:                Monitored Anesthesia Care Procedure:                Pre-Anesthesia Assessment:                           - Prior to the procedure, a History and Physical                            was performed, and patient medications and                            allergies were reviewed. The patient's tolerance of                            previous anesthesia was also reviewed. The risks                            and benefits of the procedure and the sedation                            options and risks were discussed with the patient.                            All questions were answered, and informed consent                            was obtained. Prior Anticoagulants: The patient has                            taken aspirin, last dose was 1 day prior to                            procedure. ASA Grade Assessment: III - A patient                            with severe systemic disease. After reviewing the                            risks and benefits, the patient was deemed in                            satisfactory condition to undergo the procedure.  After obtaining informed consent, the colonoscope                            was passed under direct vision. Throughout the                            procedure, the patient's blood pressure, pulse, and                            oxygen saturations were monitored continuously. The                            Model CF-HQ190L [id number]) scope was introduced   through the anus and advanced to the the cecum,                            identified by appendiceal orifice and ileocecal                            valve. The colonoscopy was performed without                            difficulty. The patient tolerated the procedure                            well. The quality of the bowel preparation was                            adequate. The ileocecal valve, appendiceal orifice,                            and rectum were photographed. Scope In: 11:37:21 AM Scope Out: 12:11:03 PM Scope Withdrawal Time: 0 hours 21 minutes 34 seconds  Total Procedure Duration: 0 hours 33 minutes 42 seconds  Findings:                 The perianal and digital rectal examinations were                            normal.                           A 5 mm polyp was found in the cecum. The polyp was                            semi-pedunculated. The polyp was removed with a hot                            snare. Resection and retrieval were complete.                           Two sessile polyps were found in the ascending                            colon. The polyps were  4 to 5 mm in size. These                            polyps were removed with a cold snare. Resection                            and retrieval were complete.                           Two sessile polyps were found in the hepatic                            flexure. The polyps were 4 to 5 mm in size. These                            polyps were removed with a cold snare. Resection                            and retrieval were complete.                           Two sessile polyps were found in the transverse                            colon. The polyps were 4 to 5 mm in size. These                            polyps were removed with a cold snare. Resection                            and retrieval were complete.                           Two sessile polyps were found in the descending                             colon. The polyps were 4 to 5 mm in size. These                            polyps were removed with a cold snare. Resection                            and retrieval were complete.                           Three sessile polyps were found in the sigmoid                            colon. The polyps were 3 to 5 mm in size. These                            polyps were removed with a cold snare. Resection  and retrieval were complete.                           A 6 mm polyp was found in the recto-sigmoid colon.                            The polyp was semi-pedunculated. The polyp was                            removed with a hot snare. Resection and retrieval                            were complete.                           Scattered medium-mouthed diverticula were found in                            the entire colon.                           Non-bleeding internal hemorrhoids were found during                            retroflexion.                           The exam was otherwise without abnormality. Complications:            No immediate complications. Estimated blood loss:                            Minimal. Estimated Blood Loss:     Estimated blood loss was minimal. Impression:               - One 5 mm polyp in the cecum, removed with a hot                            snare. Resected and retrieved.                           - Two 4 to 5 mm polyps in the ascending colon,                            removed with a cold snare. Resected and retrieved.                           - Two 4 to 5 mm polyps at the hepatic flexure,                            removed with a cold snare. Resected and retrieved.                           - Two 4 to 5 mm polyps in the transverse colon,  removed with a cold snare. Resected and retrieved.                           - Two 4 to 5 mm polyps in the descending colon,                            removed with a cold  snare. Resected and retrieved.                           - Three 3 to 5 mm polyps in the sigmoid colon,                            removed with a cold snare. Resected and retrieved.                           - One 6 mm polyp at the recto-sigmoid colon,                            removed with a hot snare. Resected and retrieved.                           - Diverticulosis in the entire examined colon.                           - Non-bleeding internal hemorrhoids.                           - The examination was otherwise normal. Recommendation:           - Patient has a contact number available for                            emergencies. The signs and symptoms of potential                            delayed complications were discussed with the                            patient. Return to normal activities tomorrow.                            Written discharge instructions were provided to the                            patient.                           - Resume previous diet.                           - Continue present medications.                           - No ibuprofen, naproxen, or other non-steroidal  anti-inflammatory drugs for 2 weeks after polyp                            removal.                           - Await pathology results.                           - Repeat colonoscopy is recommended for                            surveillance. The colonoscopy date will be                            determined after pathology results from today's                            exam become available for review. Remo Lipps P. Armbruster, MD 09/24/2015 12:18:19 PM This report has been signed electronically.

## 2015-09-24 NOTE — Progress Notes (Signed)
A/ox3 pleased with MAC, report to Tracy RN 

## 2015-09-25 ENCOUNTER — Encounter: Payer: Self-pay | Admitting: Family Medicine

## 2015-09-25 ENCOUNTER — Telehealth: Payer: Self-pay

## 2015-09-25 NOTE — Telephone Encounter (Signed)
  Follow up Call-  Call back number 09/24/2015  Post procedure Call Back phone  # [phone] or [phone]  Permission to leave phone message Yes  Some recent data might be hidden     Patient questions:  Do you have a fever, pain , or abdominal swelling? No. Pain Score  0 *  Have you tolerated food without any problems? Yes.    Have you been able to return to your normal activities? Yes.    Do you have any questions about your discharge instructions: Diet   No. Medications  No. Follow up visit  No.  Do you have questions or concerns about your Care? No.  Actions: * If pain score is 4 or above: No action needed, pain <4.

## 2015-09-29 ENCOUNTER — Encounter: Payer: Self-pay | Admitting: Gastroenterology

## 2016-07-13 ENCOUNTER — Other Ambulatory Visit: Payer: 59

## 2016-07-19 ENCOUNTER — Encounter: Payer: 59 | Admitting: Internal Medicine

## 2016-10-12 ENCOUNTER — Other Ambulatory Visit: Payer: Self-pay | Admitting: Internal Medicine

## 2016-10-21 ENCOUNTER — Encounter: Payer: Self-pay | Admitting: Internal Medicine

## 2016-10-25 ENCOUNTER — Encounter: Payer: 59 | Admitting: Internal Medicine

## 2016-11-05 ENCOUNTER — Encounter: Payer: Self-pay | Admitting: Gastroenterology

## 2016-12-21 ENCOUNTER — Encounter: Payer: 59 | Admitting: Internal Medicine

## 2016-12-27 ENCOUNTER — Encounter: Payer: Self-pay | Admitting: Gastroenterology

## 2017-01-11 ENCOUNTER — Encounter: Payer: 59 | Admitting: Internal Medicine

## 2017-02-08 ENCOUNTER — Ambulatory Visit (AMBULATORY_SURGERY_CENTER): Payer: Self-pay

## 2017-02-08 VITALS — Ht 66.0 in | Wt 256.0 lb

## 2017-02-08 DIAGNOSIS — Z86010 Personal history of colonic polyps: Secondary | ICD-10-CM

## 2017-02-08 MED ORDER — NA SULFATE-K SULFATE-MG SULF 17.5-3.13-1.6 GM/177ML PO SOLN: 1.0000 | Freq: Once | ORAL | 0 refills | Status: AC

## 2017-02-08 NOTE — Progress Notes (Signed)
Per pt, no allergies to soy or egg products.Pt not taking any weight loss meds or using  O2 at home.  Pt refused emmi video. 

## 2017-02-15 ENCOUNTER — Encounter: Payer: Self-pay | Admitting: Gastroenterology

## 2017-02-22 ENCOUNTER — Other Ambulatory Visit: Payer: Self-pay

## 2017-02-22 ENCOUNTER — Encounter: Payer: Self-pay | Admitting: Gastroenterology

## 2017-02-22 ENCOUNTER — Ambulatory Visit (AMBULATORY_SURGERY_CENTER): Payer: 59 | Admitting: Gastroenterology

## 2017-02-22 VITALS — BP 107/56 | HR 69 | Temp 99.5°F | Resp 16 | Ht 66.0 in | Wt 256.0 lb

## 2017-02-22 DIAGNOSIS — Z86010 Personal history of colonic polyps: Secondary | ICD-10-CM | POA: Diagnosis present

## 2017-02-22 MED ORDER — SODIUM CHLORIDE 0.9 % IV SOLN: 500.0000 mL | Freq: Once | INTRAVENOUS | Status: DC

## 2017-02-22 NOTE — Progress Notes (Signed)
Pt's states no medical or surgical changes since previsit or office visit. 

## 2017-02-22 NOTE — Op Note (Signed)
[facility] Patient Name: [name] Procedure Date: 02/22/2017 8:25 AM MRN: [id number] Endoscopist: Remo Lipps P. Armbruster MD, MD Age: 67 Referring MD:  Date of Birth: April 21, 1950 Gender: Male Account #: [id number] Procedure:                Colonoscopy Indications:              Surveillance: History of numerous (> 10) adenomas                            on last colonoscopy (August 2017) Medicines:                Monitored Anesthesia Care Procedure:                Pre-Anesthesia Assessment:                           - Prior to the procedure, a History and Physical                            was performed, and patient medications and                            allergies were reviewed. The patient's tolerance of                            previous anesthesia was also reviewed. The risks                            and benefits of the procedure and the sedation                            options and risks were discussed with the patient.                            All questions were answered, and informed consent                            was obtained. Prior Anticoagulants: The patient has                            taken no previous anticoagulant or antiplatelet                            agents. ASA Grade Assessment: III - A patient with                            severe systemic disease. After reviewing the risks                            and benefits, the patient was deemed in                            satisfactory condition to undergo the procedure.  After obtaining informed consent, the colonoscope                            was passed under direct vision. Throughout the                            procedure, the patient's blood pressure, pulse, and                            oxygen saturations were monitored continuously. The                            Model CF-HQ190L [id number]) scope was introduced                            through the anus  and advanced to the the cecum,                            identified by appendiceal orifice and ileocecal                            valve. The colonoscopy was performed without                            difficulty. The patient tolerated the procedure                            well. The quality of the bowel preparation was                            good. The ileocecal valve, appendiceal orifice, and                            rectum were photographed. Scope In: 8:38:42 AM Scope Out: 8:58:35 AM Scope Withdrawal Time: 0 hours 10 minutes 56 seconds  Total Procedure Duration: 0 hours 19 minutes 53 seconds  Findings:                 The perianal and digital rectal examinations were                            normal.                           Scattered medium-mouthed diverticula were found in                            the entire colon.                           There was a medium-sized lipoma, in the ascending                            colon.  Internal hemorrhoids were found during retroflexion.                           The colon revealed excessive looping, prolonging                            cecal intubation time. Abdominal pressure was                            needed to reach the cecum.                           The exam was otherwise without abnormality. No                            polyps. Complications:            No immediate complications. Estimated blood loss:                            None. Estimated Blood Loss:     Estimated blood loss: none. Impression:               - Diverticulosis in the entire examined colon.                           - Medium-sized lipoma in the ascending colon.                           - Internal hemorrhoids.                           - There was significant looping of the colon.                           - The examination was otherwise normal.                           - No specimens collected. Recommendation:            - Patient has a contact number available for                            emergencies. The signs and symptoms of potential                            delayed complications were discussed with the                            patient. Return to normal activities tomorrow.                            Written discharge instructions were provided to the                            patient.                           -  Resume previous diet.                           - Continue present medications.                           - Repeat colonoscopy in 3 years for surveillance                            given > 10 pre-cancerous polyps on last exam. Remo Lipps P. Armbruster MD, MD 02/22/2017 9:03:15 AM This report has been signed electronically.

## 2017-02-22 NOTE — Patient Instructions (Signed)
YOU HAD AN ENDOSCOPIC PROCEDURE TODAY AT [facility]:   Refer to the procedure report that was given to you for any specific questions about what was found during the examination.  If the procedure report does not answer your questions, please call your gastroenterologist to clarify.  If you requested that your care partner not be given the details of your procedure findings, then the procedure report has been included in a sealed envelope for you to review at your convenience later.  YOU SHOULD EXPECT: Some feelings of bloating in the abdomen. Passage of more gas than usual.  Walking can help get rid of the air that was put into your GI tract during the procedure and reduce the bloating. If you had a lower endoscopy (such as a colonoscopy or flexible sigmoidoscopy) you may notice spotting of blood in your stool or on the toilet paper. If you underwent a bowel prep for your procedure, you may not have a normal bowel movement for a few days.  Please Note:  You might notice some irritation and congestion in your nose or some drainage.  This is from the oxygen used during your procedure.  There is no need for concern and it should clear up in a day or so.  SYMPTOMS TO REPORT IMMEDIATELY:   Following lower endoscopy (colonoscopy or flexible sigmoidoscopy):  Excessive amounts of blood in the stool  Significant tenderness or worsening of abdominal pains  Swelling of the abdomen that is new, acute  Fever of 100F or higher   For urgent or emergent issues, a gastroenterologist can be reached at any hour by calling [phone].   DIET:  We do recommend a small meal at first, but then you may proceed to your regular diet.  Drink plenty of fluids but you should avoid alcoholic beverages for 24 hours.  MEDICATIONS: Continue present medications.  Please see handouts given to you by your recovery nurse.  ACTIVITY:  You should plan to take it easy for the rest of today and you should  NOT DRIVE or use heavy machinery until tomorrow (because of the sedation medicines used during the test).    FOLLOW UP: Our staff will call the number listed on your records the next business day following your procedure to check on you and address any questions or concerns that you may have regarding the information given to you following your procedure. If we do not reach you, we will leave a message.  However, if you are feeling well and you are not experiencing any problems, there is no need to return our call.  We will assume that you have returned to your regular daily activities without incident.  If any biopsies were taken you will be contacted by phone or by letter within the next 1-3 weeks.  Please call us at [phone] if you have not heard about the biopsies in 3 weeks.   Thank you for allowing [place] to provide for our healthcare needs today.   SIGNATURES/CONFIDENTIALITY: You and/or your care partner have signed paperwork which will be entered into your electronic medical record.  These signatures attest to the fact that that the information above on your After Visit Summary has been reviewed and is understood.  Full responsibility of the confidentiality of this discharge information lies with you and/or your care-partner.

## 2017-02-22 NOTE — Progress Notes (Signed)
Spontaneous respirations throughout. VSS. Resting comfortably. To PACU on room air. Report to  RN. 

## 2017-02-23 ENCOUNTER — Telehealth: Payer: Self-pay | Admitting: *Deleted

## 2017-02-23 NOTE — Telephone Encounter (Signed)
  Follow up Call-  Call back number 02/22/2017 09/24/2015  Post procedure Call Back phone  # [phone] or [phone] or [phone]  Permission to leave phone message Yes Yes  Some recent data might be hidden     Patient questions:  Do you have a fever, pain , or abdominal swelling? No. Pain Score  0 *  Have you tolerated food without any problems? Yes.    Have you been able to return to your normal activities? Yes.    Do you have any questions about your discharge instructions: Diet   No. Medications  No. Follow up visit  No.  Do you have questions or concerns about your Care? No.  Actions: * If pain score is 4 or above: No action needed, pain <4.

## 2017-03-29 ENCOUNTER — Ambulatory Visit: Payer: 59 | Admitting: Cardiology

## 2017-03-31 ENCOUNTER — Encounter: Payer: 59 | Admitting: Internal Medicine

## 2017-05-18 ENCOUNTER — Encounter: Payer: 59 | Admitting: Internal Medicine

## 2017-06-23 ENCOUNTER — Ambulatory Visit: Payer: 59 | Admitting: Cardiology

## 2017-06-29 NOTE — Progress Notes (Signed)
HPI: FU CAD; history of coronary artery disease, status post coronary artery bypass graft surgery. This was performed in June 2006. Patient had a LIMA to the LAD, saphenous vein graft to the diagonal, saphenous vein graft to the acute marginal and saphenous vein graft to the PDA. Carotid Dopplers August 2015 showed no significant stenosis. Nuclear study August 2015 showed ejection fraction 55% but no ischemia or infarction. Possible mild decrease in systolic blood pressure at peak exercise.  Abdominal ultrasound June 2017 showed no aneurysm.  Echocardiogram June 2017 showed normal LV function, moderate diastolic dysfunction, mild aortic stenosis with mean gradient 19 mmHg and moderate to severe left atrial enlargement.  Since last seen the patient has dyspnea with more extreme activities but not with routine activities. It is relieved with rest. It is not associated with chest pain. There is no orthopnea, PND or pedal edema. There is no syncope or palpitations. There is no exertional chest pain.   Current Outpatient Medications  Medication Sig Dispense Refill  . aspirin (ASPIRIN CHILDRENS) 81 MG chewable tablet Chew 1 tablet (81 mg total) by mouth daily.    Marland Kitchen atorvastatin (LIPITOR) 80 MG tablet TAKE 1 TABLET BY MOUTH EVERY DAY 90 tablet 2  . fluocinonide-emollient (LIDEX-E) 0.05 % cream     . losartan (COZAAR) 100 MG tablet TAKE 1 TABLET BY MOUTH EVERY DAY 90 tablet 2  . metoprolol succinate (TOPROL-XL) 50 MG 24 hr tablet TAKE 1 TABLET BY MOUTH EVERY DAY 90 tablet 2  . sildenafil (VIAGRA) 100 MG tablet Take one half tablet daily as needed. 6 tablet 6  . tamsulosin (FLOMAX) 0.4 MG CAPS capsule TAKE ONE CAPSULE BY MOUTH EVERY DAY AFTER SUPPER 90 capsule 2   Current Facility-Administered Medications  Medication Dose Route Frequency Provider Last Rate Last Dose  . 0.9 %  sodium chloride infusion  500 mL Intravenous Continuous Armbruster, Carlota Raspberry, MD      . 0.9 %  sodium chloride infusion  500  mL Intravenous Once Armbruster, Carlota Raspberry, MD         Past Medical History:  Diagnosis Date  . Adjustment disorder with depressed mood 09/07/2007  . Colon polyps    Tubular Adenoma 2010  . CONGESTIVE HEART FAILURE 12/09/2008  . CORONARY ARTERY DISEASE 2006  . HYPERLIPIDEMIA 08/09/2006  . HYPERTENSION 08/09/2006  . MYOCARDIAL INFARCTION, HX OF 07/07/2004  . 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 OBESITY 07/19/2008    Past Surgical History:  Procedure Laterality Date  . COLONOSCOPY    . CORONARY ARTERY BYPASS GRAFT  2006    Social History   Socioeconomic History  . Marital status: Single    Spouse name: Not on file  . Number of children: Not on file  . Years of education: Not on file  . Highest education level: Not on file  Occupational History  . Not on file  Social Needs  . Financial resource strain: Not on file  . Food insecurity:    Worry: Not on file    Inability: Not on file  . Transportation needs:    Medical: Not on file    Non-medical: Not on file  Tobacco Use  . Smoking status: Never Smoker  . Smokeless tobacco: Never Used  Substance and Sexual Activity  . Alcohol use: Yes    Alcohol/week: 0.6 oz    Types: 1 Cans of beer per week    Comment: occasional  . Drug use: No  . Sexual activity: Not  on file  Lifestyle  . Physical activity:    Days per week: Not on file    Minutes per session: Not on file  . Stress: Not on file  Relationships  . Social connections:    Talks on phone: Not on file    Gets together: Not on file    Attends religious service: Not on file    Active member of club or organization: Not on file    Attends meetings of clubs or organizations: Not on file    Relationship status: Not on file  . Intimate partner violence:    Fear of current or ex partner: Not on file    Emotionally abused: Not on file    Physically abused: Not on file    Forced sexual activity: Not on file  Other Topics Concern  . Not on file  Social History Narrative    . Not on file    Family History  Problem Relation Age of Onset  . Hypertension Mother   . Hyperlipidemia Mother   . Heart disease Father   . Ulcers Father   . Bipolar disorder Brother   . Colon cancer Neg Hx     ROS: no fevers or chills, productive cough, hemoptysis, dysphasia, odynophagia, melena, hematochezia, dysuria, hematuria, rash, seizure activity, orthopnea, PND, pedal edema, claudication. Remaining systems are negative.  Physical Exam: Well-developed obese in no acute distress.  Skin is warm and dry.  HEENT is normal.  Neck is supple.  Chest is clear to auscultation with normal expansion.  Cardiovascular exam is regular rate and rhythm.  3/6 systolic murmur left sternal border.  S2 is diminished.  Radiates to carotids Abdominal exam nontender or distended. No masses palpated. Extremities show no edema. neuro grossly intact  ECG-sinus rhythm at a rate of 73.  First-degree AV block.  Right bundle branch block.  Cannot rule out septal infarct.  Left anterior fascicular block.  Personally reviewed  A/P  1 coronary artery disease-patient denies chest pain.  Continue medical therapy with aspirin and statin.  2 hypertension-blood pressure is controlled today.  Continue present medications.  3 hyperlipidemia-continue statin.  Lipids and liver monitored by primary care.  4 aortic stenosis-previous mean gradient was 19 mmHg.  Murmur is worse.  I will arrange a follow-up echocardiogram.  We discussed aortic stenosis and that he may require aortic valve replacement in the future.  Kirk Ruths, MD

## 2017-06-30 ENCOUNTER — Ambulatory Visit: Payer: 59 | Admitting: Cardiology

## 2017-06-30 ENCOUNTER — Encounter: Payer: Self-pay | Admitting: Cardiology

## 2017-06-30 VITALS — BP 102/54 | HR 73 | Ht 64.0 in | Wt 246.0 lb

## 2017-06-30 DIAGNOSIS — E7800 Pure hypercholesterolemia, unspecified: Secondary | ICD-10-CM | POA: Diagnosis not present

## 2017-06-30 DIAGNOSIS — I10 Essential (primary) hypertension: Secondary | ICD-10-CM

## 2017-06-30 DIAGNOSIS — I2510 Atherosclerotic heart disease of native coronary artery without angina pectoris: Secondary | ICD-10-CM | POA: Diagnosis not present

## 2017-06-30 DIAGNOSIS — I350 Nonrheumatic aortic (valve) stenosis: Secondary | ICD-10-CM

## 2017-06-30 NOTE — Patient Instructions (Signed)

## 2017-07-03 ENCOUNTER — Other Ambulatory Visit: Payer: Self-pay | Admitting: Internal Medicine

## 2017-07-04 ENCOUNTER — Ambulatory Visit (INDEPENDENT_AMBULATORY_CARE_PROVIDER_SITE_OTHER): Payer: 59 | Admitting: Internal Medicine

## 2017-07-04 ENCOUNTER — Encounter: Payer: Self-pay | Admitting: Internal Medicine

## 2017-07-04 VITALS — BP 90/72 | HR 71 | Temp 98.5°F | Ht 65.0 in | Wt 248.0 lb

## 2017-07-04 DIAGNOSIS — Z23 Encounter for immunization: Secondary | ICD-10-CM

## 2017-07-04 DIAGNOSIS — E785 Hyperlipidemia, unspecified: Secondary | ICD-10-CM | POA: Diagnosis not present

## 2017-07-04 DIAGNOSIS — I2510 Atherosclerotic heart disease of native coronary artery without angina pectoris: Secondary | ICD-10-CM

## 2017-07-04 DIAGNOSIS — I10 Essential (primary) hypertension: Secondary | ICD-10-CM

## 2017-07-04 DIAGNOSIS — Z0000 Encounter for general adult medical examination without abnormal findings: Secondary | ICD-10-CM

## 2017-07-04 DIAGNOSIS — D696 Thrombocytopenia, unspecified: Secondary | ICD-10-CM

## 2017-07-04 DIAGNOSIS — R7302 Impaired glucose tolerance (oral): Secondary | ICD-10-CM

## 2017-07-04 LAB — COMPREHENSIVE METABOLIC PANEL
ALT: 30 U/L (ref 0–53)
AST: 38 U/L — ABNORMAL HIGH (ref 0–37)
Albumin: 3.6 g/dL (ref 3.5–5.2)
Alkaline Phosphatase: 76 U/L (ref 39–117)
BUN: 25 mg/dL — ABNORMAL HIGH (ref 6–23)
CO2: 29 mEq/L (ref 19–32)
Calcium: 9.2 mg/dL (ref 8.4–10.5)
Chloride: 103 mEq/L (ref 96–112)
Creatinine, Ser: 1.64 mg/dL — ABNORMAL HIGH (ref 0.40–1.50)
GFR: 44.66 mL/min — ABNORMAL LOW (ref >60.00)
Glucose, Bld: 129 mg/dL — ABNORMAL HIGH (ref 70–99)
Potassium: 4.1 mEq/L (ref 3.5–5.1)
Sodium: 138 mEq/L (ref 135–145)
Total Bilirubin: 1.1 mg/dL (ref 0.2–1.2)
Total Protein: 6.6 g/dL (ref 6.0–8.3)

## 2017-07-04 LAB — CBC WITH DIFFERENTIAL/PLATELET
Basophils Absolute: 0 10*3/uL (ref 0.0–0.1)
Basophils Relative: 0.3 % (ref 0.0–3.0)
Eosinophils Absolute: 0.2 10*3/uL (ref 0.0–0.7)
Eosinophils Relative: 2.3 % (ref 0.0–5.0)
HCT: 36.2 % — ABNORMAL LOW (ref 39.0–52.0)
Hemoglobin: 12.4 g/dL — ABNORMAL LOW (ref 13.0–17.0)
Lymphocytes Relative: 24.1 % (ref 12.0–46.0)
Lymphs Abs: 1.7 10*3/uL (ref 0.7–4.0)
MCHC: 34.3 g/dL (ref 30.0–36.0)
MCV: 91.4 fl (ref 78.0–100.0)
Monocytes Absolute: 0.6 10*3/uL (ref 0.1–1.0)
Monocytes Relative: 8.1 % (ref 3.0–12.0)
Neutro Abs: 4.7 10*3/uL (ref 1.4–7.7)
Neutrophils Relative %: 65.2 % (ref 43.0–77.0)
Platelets: 125 10*3/uL — ABNORMAL LOW (ref 150.0–400.0)
RBC: 3.96 Mil/uL — ABNORMAL LOW (ref 4.22–5.81)
RDW: 14.6 % (ref 11.5–15.5)
WBC: 7.1 10*3/uL (ref 4.0–10.5)

## 2017-07-04 LAB — LIPID PANEL
Cholesterol: 111 mg/dL (ref 0–200)
HDL: 28.7 mg/dL — ABNORMAL LOW (ref >39.00)
LDL Cholesterol: 58 mg/dL (ref 0–99)
NonHDL: 82.24
Total CHOL/HDL Ratio: 4
Triglycerides: 121 mg/dL (ref 0.0–149.0)
VLDL: 24.2 mg/dL (ref 0.0–40.0)

## 2017-07-04 LAB — TSH: TSH: 2.7 u[IU]/mL (ref 0.35–4.50)

## 2017-07-04 NOTE — Progress Notes (Signed)
Subjective:    Patient ID: [name], male    DOB: [birth year], 67 y.o.   MRN: [id number]  HPI  67 year old patient who is seen today for a preventive health examination He has a history of coronary artery disease as well as aortic stenosis.  He has been seen by cardiology recently and has a follow-up 2D echocardiogram ordered.  His cardiac status has been stable. He has a history of colonic polyps and had follow-up colonoscopy in January of this year. He has a history of dyslipidemia. No cardiopulmonary complaints.  He continues to work  Past Medical History:  Diagnosis Date  . Adjustment disorder with depressed mood 09/07/2007  . Colon polyps    Tubular Adenoma 2010  . CONGESTIVE HEART FAILURE 12/09/2008  . CORONARY ARTERY DISEASE 2006  . HYPERLIPIDEMIA 08/09/2006  . HYPERTENSION 08/09/2006  . MYOCARDIAL INFARCTION, HX OF 07/07/2004  . 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 OBESITY 07/19/2008     Social History   Socioeconomic History  . Marital status: Single    Spouse name: Not on file  . Number of children: Not on file  . Years of education: Not on file  . Highest education level: Not on file  Occupational History  . Not on file  Social Needs  . Financial resource strain: Not on file  . Food insecurity:    Worry: Not on file    Inability: Not on file  . Transportation needs:    Medical: Not on file    Non-medical: Not on file  Tobacco Use  . Smoking status: Never Smoker  . Smokeless tobacco: Never Used  Substance and Sexual Activity  . Alcohol use: Yes    Alcohol/week: 0.6 oz    Types: 1 Cans of beer per week    Comment: occasional  . Drug use: No  . Sexual activity: Not on file  Lifestyle  . Physical activity:    Days per week: Not on file    Minutes per session: Not on file  . Stress: Not on file  Relationships  . Social connections:    Talks on phone: Not on file    Gets together: Not on file    Attends religious service: Not on file    Active  member of club or organization: Not on file    Attends meetings of clubs or organizations: Not on file    Relationship status: Not on file  . Intimate partner violence:    Fear of current or ex partner: Not on file    Emotionally abused: Not on file    Physically abused: Not on file    Forced sexual activity: Not on file  Other Topics Concern  . Not on file  Social History Narrative  . Not on file    Past Surgical History:  Procedure Laterality Date  . COLONOSCOPY    . CORONARY ARTERY BYPASS GRAFT  2006    Family History  Problem Relation Age of Onset  . Hypertension Mother   . Hyperlipidemia Mother   . Heart disease Father   . Ulcers Father   . Bipolar disorder Brother   . Colon cancer Neg Hx     No Known Allergies  Current Outpatient Medications on File Prior to Visit  Medication Sig Dispense Refill  . aspirin (ASPIRIN CHILDRENS) 81 MG chewable tablet Chew 1 tablet (81 mg total) by mouth daily.    Marland Kitchen atorvastatin (LIPITOR) 80 MG tablet TAKE 1 TABLET BY MOUTH EVERY DAY  30 tablet 0  . losartan (COZAAR) 100 MG tablet TAKE 1 TABLET BY MOUTH EVERY DAY 30 tablet 0  . metoprolol succinate (TOPROL-XL) 50 MG 24 hr tablet TAKE 1 TABLET BY MOUTH EVERY DAY 30 tablet 0  . tamsulosin (FLOMAX) 0.4 MG CAPS capsule TAKE ONE CAPSULE BY MOUTH EVERY DAY AFTER SUPPER 30 capsule 0   Current Facility-Administered Medications on File Prior to Visit  Medication Dose Route Frequency Provider Last Rate Last Dose  . 0.9 %  sodium chloride infusion  500 mL Intravenous Continuous Armbruster, Carlota Raspberry, MD      . 0.9 %  sodium chloride infusion  500 mL Intravenous Once Armbruster, Carlota Raspberry, MD        BP 90/72 (BP Location: Right Arm, Patient Position: Sitting, Cuff Size: Large)   Pulse 71   Temp 98.5 F (36.9 C) (Oral)   Ht 5\' 5"  (1.651 m)   Wt 248 lb (112.5 kg)   SpO2 96%   BMI 41.27 kg/m     Review of Systems  Constitutional: Negative for appetite change, chills, fatigue and fever.    HENT: Negative for congestion, dental problem, ear pain, hearing loss, sore throat, tinnitus, trouble swallowing and voice change.   Eyes: Negative for pain, discharge and visual disturbance.  Respiratory: Negative for cough, chest tightness, wheezing and stridor.   Cardiovascular: Negative for chest pain, palpitations and leg swelling.  Gastrointestinal: Negative for abdominal distention, abdominal pain, blood in stool, constipation, diarrhea, nausea and vomiting.  Genitourinary: Negative for difficulty urinating, discharge, flank pain, genital sores, hematuria and urgency.  Musculoskeletal: Negative for arthralgias, back pain, gait problem, joint swelling, myalgias and neck stiffness.  Skin: Positive for rash.  Neurological: Negative for dizziness, syncope, speech difficulty, weakness, numbness and headaches.  Hematological: Negative for adenopathy. Does not bruise/bleed easily.  Psychiatric/Behavioral: Negative for behavioral problems and dysphoric mood. The patient is not nervous/anxious.        Objective:   Physical Exam  Constitutional: He appears well-developed and well-nourished.  Weight 248 Blood pressure low normal  HENT:  Head: Normocephalic and atraumatic.  Right Ear: External ear normal.  Left Ear: External ear normal.  Nose: Nose normal.  Mouth/Throat: Oropharynx is clear and moist.  Eyes: Pupils are equal, round, and reactive to light. Conjunctivae and EOM are normal. No scleral icterus.  Neck: Normal range of motion. Neck supple. No JVD present. No thyromegaly present.  Cardiovascular: Regular rhythm and intact distal pulses. Exam reveals no gallop and no friction rub.  Murmur heard. Absent left dorsalis pedis pulse  Grade 3/6 systolic murmur loudest at the primary aortic area with radiation into the carotid distribution especially on the right  Pulmonary/Chest: Effort normal and breath sounds normal. He exhibits no tenderness.  Abdominal: Soft. Bowel sounds are  normal. He exhibits no distension and no mass. There is no tenderness.  Genitourinary: Penis normal.  Genitourinary Comments: Rectal exam deferred due to recent colonoscopy Intertriginous rash of the groin  Musculoskeletal: Normal range of motion. He exhibits no edema or tenderness.  Lymphadenopathy:    He has no cervical adenopathy.  Neurological: He is alert. He has normal reflexes. No cranial nerve deficit. Coordination normal.  Skin: Skin is warm and dry. No rash noted.  Psychiatric: He has a normal mood and affect. His behavior is normal.          Assessment & Plan:  Preventive health care  Coronary artery disease History of aortic stenosis.  Follow-up 2D echocardiogram as scheduled History  of colonic polyps.  Continue 5-year interval colonoscopies Overweight.  Weight loss encouraged Essential hypertension.  Blood pressure well controlled Dyslipidemia continue statin therapy.  Will review a lipid profile  Prevnar 13 dispensed Follow-up 1 year or as needed  Review screening lab  [employer]

## 2017-07-04 NOTE — Telephone Encounter (Signed)
Pt needs to schedule an appointment for more refills.

## 2017-07-04 NOTE — Patient Instructions (Signed)
Limit your sodium (Salt) intake    It is important that you exercise regularly, at least 20 minutes 3 to 4 times per week.  If you develop chest pain or shortness of breath seek  medical attention.  You need to lose weight.  Consider a lower calorie diet and regular exercise.  Return in one year for follow-up 

## 2017-07-04 NOTE — Addendum Note (Signed)
Addended by: Gwenyth Ober R on: 07/04/2017 05:34 PM   Modules accepted: Orders

## 2017-07-05 LAB — HEPATITIS C ANTIBODY
Hepatitis C Ab: NONREACTIVE
SIGNAL TO CUT-OFF: 0.02 (ref <1.00)

## 2017-07-08 ENCOUNTER — Other Ambulatory Visit: Payer: Self-pay

## 2017-07-08 ENCOUNTER — Ambulatory Visit (HOSPITAL_COMMUNITY): Payer: 59 | Attending: Internal Medicine

## 2017-07-08 DIAGNOSIS — I2510 Atherosclerotic heart disease of native coronary artery without angina pectoris: Secondary | ICD-10-CM | POA: Diagnosis not present

## 2017-07-08 DIAGNOSIS — E669 Obesity, unspecified: Secondary | ICD-10-CM | POA: Insufficient documentation

## 2017-07-08 DIAGNOSIS — I252 Old myocardial infarction: Secondary | ICD-10-CM | POA: Diagnosis not present

## 2017-07-08 DIAGNOSIS — I509 Heart failure, unspecified: Secondary | ICD-10-CM | POA: Diagnosis not present

## 2017-07-08 DIAGNOSIS — Z951 Presence of aortocoronary bypass graft: Secondary | ICD-10-CM | POA: Insufficient documentation

## 2017-07-08 DIAGNOSIS — I350 Nonrheumatic aortic (valve) stenosis: Secondary | ICD-10-CM | POA: Diagnosis not present

## 2017-07-08 DIAGNOSIS — I110 Hypertensive heart disease with heart failure: Secondary | ICD-10-CM | POA: Insufficient documentation

## 2017-07-08 DIAGNOSIS — I080 Rheumatic disorders of both mitral and aortic valves: Secondary | ICD-10-CM | POA: Insufficient documentation

## 2017-07-08 DIAGNOSIS — Z6841 Body Mass Index (BMI) 40.0 and over, adult: Secondary | ICD-10-CM | POA: Diagnosis not present

## 2017-07-08 DIAGNOSIS — E785 Hyperlipidemia, unspecified: Secondary | ICD-10-CM | POA: Insufficient documentation

## 2017-07-08 MED ORDER — PERFLUTREN LIPID MICROSPHERE
1.0000 mL | INTRAVENOUS | Status: AC | PRN
  Administered 2017-07-08: 2 mL via INTRAVENOUS

## 2017-08-01 ENCOUNTER — Other Ambulatory Visit: Payer: Self-pay | Admitting: Internal Medicine

## 2017-08-02 ENCOUNTER — Other Ambulatory Visit: Payer: Self-pay | Admitting: Internal Medicine

## 2017-08-10 ENCOUNTER — Encounter: Payer: Self-pay | Admitting: Internal Medicine

## 2017-08-10 ENCOUNTER — Ambulatory Visit: Payer: 59 | Admitting: Internal Medicine

## 2017-08-10 VITALS — BP 120/50 | HR 60 | Temp 98.1°F | Wt 249.8 lb

## 2017-08-10 DIAGNOSIS — R7302 Impaired glucose tolerance (oral): Secondary | ICD-10-CM | POA: Diagnosis not present

## 2017-08-10 DIAGNOSIS — I2510 Atherosclerotic heart disease of native coronary artery without angina pectoris: Secondary | ICD-10-CM | POA: Diagnosis not present

## 2017-08-10 DIAGNOSIS — I10 Essential (primary) hypertension: Secondary | ICD-10-CM

## 2017-08-10 LAB — BASIC METABOLIC PANEL
BUN: 14 mg/dL (ref 6–23)
CO2: 30 mEq/L (ref 19–32)
Calcium: 9 mg/dL (ref 8.4–10.5)
Chloride: 104 mEq/L (ref 96–112)
Creatinine, Ser: 0.78 mg/dL (ref 0.40–1.50)
GFR: 105.26 mL/min (ref >60.00)
Glucose, Bld: 131 mg/dL — ABNORMAL HIGH (ref 70–99)
Potassium: 4.6 mEq/L (ref 3.5–5.1)
Sodium: 140 mEq/L (ref 135–145)

## 2017-08-10 LAB — HEMOGLOBIN A1C: Hgb A1c MFr Bld: 6.4 % (ref 4.6–6.5)

## 2017-08-10 MED ORDER — POLYETHYLENE GLYCOL 3350 17 GM/SCOOP PO POWD: 17.0000 g | Freq: Two times a day (BID) | ORAL | 3 refills | Status: DC | PRN

## 2017-08-10 NOTE — Patient Instructions (Signed)
Limit your sodium (Salt) intake    It is important that you exercise regularly, at least 20 minutes 3 to 4 times per week.  If you develop chest pain or shortness of breath seek  medical attention.  You need to lose weight.  Consider a lower calorie diet and regular exercise. 

## 2017-08-10 NOTE — Progress Notes (Signed)
Subjective:    Patient ID: ANTWOIN LACKEY, male    DOB: 04-08-1950, 67 y.o.   MRN: [id number]  HPI  67 year old patient who has a history of essential hypertension coronary artery disease as well as congestive heart failure.  He was seen last month for his annual exam and there was a significant bump in creatinine and decline in GFR.  Losartan therapy was held at that time he is seen today for follow-up.  He continues to do well He does use Aleve rarely Cardiac status has been stable  Past Medical History:  Diagnosis Date  . Adjustment disorder with depressed mood 09/07/2007  . Colon polyps    Tubular Adenoma 2010  . CONGESTIVE HEART FAILURE 12/09/2008  . CORONARY ARTERY DISEASE 2006  . HYPERLIPIDEMIA 08/09/2006  . HYPERTENSION 08/09/2006  . MYOCARDIAL INFARCTION, HX OF 07/07/2004  . 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 OBESITY 07/19/2008     Social History   Socioeconomic History  . Marital status: Single    Spouse name: Not on file  . Number of children: Not on file  . Years of education: Not on file  . Highest education level: Not on file  Occupational History  . Not on file  Social Needs  . Financial resource strain: Not on file  . Food insecurity:    Worry: Not on file    Inability: Not on file  . Transportation needs:    Medical: Not on file    Non-medical: Not on file  Tobacco Use  . Smoking status: Never Smoker  . Smokeless tobacco: Never Used  Substance and Sexual Activity  . Alcohol use: Yes    Alcohol/week: 0.6 oz    Types: 1 Cans of beer per week    Comment: occasional  . Drug use: No  . Sexual activity: Not on file  Lifestyle  . Physical activity:    Days per week: Not on file    Minutes per session: Not on file  . Stress: Not on file  Relationships  . Social connections:    Talks on phone: Not on file    Gets together: Not on file    Attends religious service: Not on file    Active member of club or organization: Not on file    Attends meetings  of clubs or organizations: Not on file    Relationship status: Not on file  . Intimate partner violence:    Fear of current or ex partner: Not on file    Emotionally abused: Not on file    Physically abused: Not on file    Forced sexual activity: Not on file  Other Topics Concern  . Not on file  Social History Narrative  . Not on file    Past Surgical History:  Procedure Laterality Date  . COLONOSCOPY    . CORONARY ARTERY BYPASS GRAFT  2006    Family History  Problem Relation Age of Onset  . Hypertension Mother   . Hyperlipidemia Mother   . Heart disease Father   . Ulcers Father   . Bipolar disorder Brother   . Colon cancer Neg Hx     No Known Allergies  Current Outpatient Medications on File Prior to Visit  Medication Sig Dispense Refill  . aspirin (ASPIRIN CHILDRENS) 81 MG chewable tablet Chew 1 tablet (81 mg total) by mouth daily.    Marland Kitchen atorvastatin (LIPITOR) 80 MG tablet TAKE 1 TABLET BY MOUTH EVERY DAY 30 tablet 0  . metoprolol  succinate (TOPROL-XL) 50 MG 24 hr tablet TAKE 1 TABLET BY MOUTH EVERY DAY 30 tablet 0  . tamsulosin (FLOMAX) 0.4 MG CAPS capsule TAKE ONE CAPSULE BY MOUTH EVERY DAY AFTER SUPPER 30 capsule 0  . losartan (COZAAR) 100 MG tablet TAKE 1 TABLET BY MOUTH EVERY DAY (Patient not taking: Reported on 08/10/2017) 30 tablet 0   Current Facility-Administered Medications on File Prior to Visit  Medication Dose Route Frequency Provider Last Rate Last Dose  . 0.9 %  sodium chloride infusion  500 mL Intravenous Continuous Armbruster, Carlota Raspberry, MD      . 0.9 %  sodium chloride infusion  500 mL Intravenous Once Armbruster, Carlota Raspberry, MD        BP (!) 120/50 (BP Location: Right Arm, Patient Position: Sitting, Cuff Size: Large)   Pulse 60   Temp 98.1 F (36.7 C) (Oral)   Wt 249 lb 12.8 oz (113.3 kg)   SpO2 96%   BMI 41.57 kg/m     Review of Systems  Constitutional: Negative for appetite change, chills, fatigue and fever.  HENT: Negative for congestion,  dental problem, ear pain, hearing loss, sore throat, tinnitus, trouble swallowing and voice change.   Eyes: Negative for pain, discharge and visual disturbance.  Respiratory: Negative for cough, chest tightness, wheezing and stridor.   Cardiovascular: Negative for chest pain, palpitations and leg swelling.  Gastrointestinal: Negative for abdominal distention, abdominal pain, blood in stool, constipation, diarrhea, nausea and vomiting.  Genitourinary: Negative for difficulty urinating, discharge, flank pain, genital sores, hematuria and urgency.  Musculoskeletal: Negative for arthralgias, back pain, gait problem, joint swelling, myalgias and neck stiffness.  Skin: Negative for rash.  Neurological: Negative for dizziness, syncope, speech difficulty, weakness, numbness and headaches.  Hematological: Negative for adenopathy. Does not bruise/bleed easily.  Psychiatric/Behavioral: Negative for behavioral problems and dysphoric mood. The patient is not nervous/anxious.        Objective:   Physical Exam  Constitutional: He appears well-developed and well-nourished. No distress.  Blood pressure remains low normal  Cardiovascular: Normal rate and regular rhythm.  Pulmonary/Chest: Effort normal and breath sounds normal.          Assessment & Plan:   Coronary artery disease History of congestive heart failure stable Decrease renal function.  Losartan has been on hold.  Will recheck renal indices today. Follow-up 2 months  Impaired glucose tolerance.  Will check hemoglobin A1c.  Weight loss encouraged  Marletta Lor

## 2017-09-05 ENCOUNTER — Telehealth: Payer: Self-pay | Admitting: Cardiology

## 2017-09-05 ENCOUNTER — Other Ambulatory Visit: Payer: Self-pay | Admitting: Internal Medicine

## 2017-09-05 ENCOUNTER — Encounter: Payer: Self-pay | Admitting: Adult Health

## 2017-09-05 ENCOUNTER — Ambulatory Visit: Payer: 59 | Admitting: Adult Health

## 2017-09-05 VITALS — BP 135/66 | HR 62 | Ht 66.0 in | Wt 258.0 lb

## 2017-09-05 DIAGNOSIS — Z79899 Other long term (current) drug therapy: Secondary | ICD-10-CM | POA: Diagnosis not present

## 2017-09-05 DIAGNOSIS — G4733 Obstructive sleep apnea (adult) (pediatric): Secondary | ICD-10-CM

## 2017-09-05 DIAGNOSIS — I5043 Acute on chronic combined systolic (congestive) and diastolic (congestive) heart failure: Secondary | ICD-10-CM

## 2017-09-05 DIAGNOSIS — I4891 Unspecified atrial fibrillation: Secondary | ICD-10-CM | POA: Diagnosis not present

## 2017-09-05 DIAGNOSIS — R0602 Shortness of breath: Secondary | ICD-10-CM

## 2017-09-05 MED ORDER — APIXABAN 5 MG PO TABS: 5.0000 mg | ORAL_TABLET | Freq: Two times a day (BID) | ORAL | 1 refills | Status: DC

## 2017-09-05 MED ORDER — FUROSEMIDE 20 MG PO TABS: 20.0000 mg | ORAL_TABLET | Freq: Every day | ORAL | 3 refills | Status: DC

## 2017-09-05 MED ORDER — POTASSIUM CHLORIDE CRYS ER 20 MEQ PO TBCR: 20.0000 meq | EXTENDED_RELEASE_TABLET | Freq: Every day | ORAL | 3 refills | Status: DC

## 2017-09-05 NOTE — Telephone Encounter (Signed)
New message   Pt c/o Shortness Of Breath: STAT if SOB developed within the last 24 hours or pt is noticeably SOB on the phone  1. Are you currently SOB (can you hear that pt is SOB on the phone)? YES  2. How long have you been experiencing SOB? 2 DAYS  3. Are you SOB when sitting or when up moving around? SITTING AND LAYING DOWN  4. Are you currently experiencing any other symptoms? SWELLING IN FEET     1) How much weight have you gained and in what time span?  2) If swelling, where is the swelling located? FEET, ANKLES  3) Are you currently taking a fluid pill? NO  4) Are you currently SOB? YES  5) Do you have a log of your daily weights (if so, list)?  6) Have you gained 3 pounds in a day or 5 pounds in a week?   7) Have you traveled recently? NO

## 2017-09-05 NOTE — Progress Notes (Signed)
Cardiology Office Note   Date:  09/05/2017   ID:  Julez, Huseby 27-Oct-1950, MRN [id number]  PCP:  Marletta Lor, MD  Cardiologist:  Stanford Breed  Chief Complaint  Patient presents with  . Shortness of Breath  . Leg Swelling     History of Present Illness: [name] is a 67 y.o. male who presents for complaints of dyspnea and LEE. He is having symptoms of PND but no orthopnea. He is sleeping in a chair. Symptoms began approximately one week ago, with worsening symptoms for the last 3-4 days. He denies chest pain. He states he was attacked by wasps when working in his yard a week ago and did swell up on his left arm and shoulder. He states he hasn't felt well since.   Other history includes CAD with hx of CABG in 2006, LIMA to LAD, SVG to diagonal, SVG to the acute marginal and SVG to the PDA. Echocardiogram in  07/08/2017.   Procedure narrative: Transthoracic echocardiography for left   ventricular function evaluation, for right ventricular function   evaluation, and for assessment of valvular function. Image   quality was suboptimal. Intravenous contrast (Definity) was   administered to enhance regional wall motion assessment and   opacify the LV. - Left ventricle: The cavity size was mildly dilated. Wall   thickness was increased in a pattern of mild LVH. Systolic   function was mildly reduced. The estimated ejection fraction was   in the range of 45% to 50%. Basal to mid inferior hypokinesis to   akinesis. Doppler parameters are consistent with abnormal left   ventricular relaxation (grade 1 diastolic dysfunction). The E/e&'   ratio is >15, suggesting elevated LV filling pressure. - Aortic valve: Mildly calcified leaflets. Mild to moderate   stenosis. Trivial regurgitation. Mean gradient (S): 17 mm Hg.   Peak gradient (S): 33 mm Hg. Valve area (VTI): 1.7 cm^2. Valve   area (Vmax): 1.56 cm^2. Valve area (Vmean): 1.69 cm^2. - Mitral valve: Calcified annulus. Mildly  thickened leaflets .   There was mild regurgitation. - Left atrium: Moderately dilated. - Tricuspid valve: There was trivial regurgitation. - Pulmonary arteries: PA peak pressure: 21 mm Hg (S) + RAP.  Past Medical History:  Diagnosis Date  . Adjustment disorder with depressed mood 09/07/2007  . Colon polyps    Tubular Adenoma 2010  . CONGESTIVE HEART FAILURE 12/09/2008  . CORONARY ARTERY DISEASE 2006  . HYPERLIPIDEMIA 08/09/2006  . HYPERTENSION 08/09/2006  . MYOCARDIAL INFARCTION, HX OF 07/07/2004  . 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 OBESITY 07/19/2008    Past Surgical History:  Procedure Laterality Date  . COLONOSCOPY    . CORONARY ARTERY BYPASS GRAFT  2006     Current Outpatient Medications  Medication Sig Dispense Refill  . atorvastatin (LIPITOR) 80 MG tablet TAKE 1 TABLET BY MOUTH EVERY DAY 30 tablet 0  . metoprolol succinate (TOPROL-XL) 50 MG 24 hr tablet Take 50 mg by mouth daily. Take with or immediately following a meal.    . polyethylene glycol powder (GLYCOLAX/MIRALAX) powder Take 17 g by mouth 2 (two) times daily as needed. 3350 g 3  . tamsulosin (FLOMAX) 0.4 MG CAPS capsule TAKE ONE CAPSULE BY MOUTH EVERY DAY AFTER SUPPER 30 capsule 0  . apixaban (ELIQUIS) 5 MG TABS tablet Take 1 tablet (5 mg total) by mouth 2 (two) times daily. 60 tablet 1  . furosemide (LASIX) 20 MG tablet Take 1 tablet (20 mg total) by mouth daily.  40mg  daily x3days w/k+ then down to 20mg  daily 33 tablet 3  . potassium chloride SA (KLOR-CON M20) 20 MEQ tablet Take 1 tablet (20 mEq total) by mouth daily. 40mg  daily x3days w/lasix 40mg  then down to 20mg  daily 33 tablet 3   Current Facility-Administered Medications  Medication Dose Route Frequency Provider Last Rate Last Dose  . 0.9 %  sodium chloride infusion  500 mL Intravenous Continuous Armbruster, Carlota Raspberry, MD      . 0.9 %  sodium chloride infusion  500 mL Intravenous Once Armbruster, Carlota Raspberry, MD        Allergies:   Patient has no known  allergies.    Social History:  The patient  reports that he has never smoked. He has never used smokeless tobacco. He reports that he drinks about 0.6 oz of alcohol per week. He reports that he does not use drugs.   Family History:  The patient's family history includes Bipolar disorder in his brother; Heart disease in his father; Hyperlipidemia in his mother; Hypertension in his mother; Ulcers in his father.    ROS: All other systems are reviewed and negative. Unless otherwise mentioned in H&P    PHYSICAL EXAM: VS:  BP 135/66   Pulse 62   Ht 5\' 6"  (1.676 m)   Wt 258 lb (117 kg)   BMI 41.64 kg/m  , BMI Body mass index is 41.64 kg/m. GEN: Well nourished, well developed, in no acute distress  HEENT: normal  Neck: no JVD, carotid bruits, or masses Cardiac:IRRR; no murmurs, rubs, or gallops,no edema  Respiratory:  clear to auscultation bilaterally, normal work of breathing GI: soft, nontender, nondistended, + BS MS: no deformity or atrophy  Skin: warm and dry, no rash Neuro:  Strength and sensation are intact Psych: euthymic mood, full affect   EKG: Coarse atrial fibrillation rate of 62 bpm with  RBBB, LAFB. With LVH. Atrial fib is new.   Recent Labs: 07/04/2017: ALT 30; Hemoglobin 12.4; Platelets 125.0; TSH 2.70 08/10/2017: BUN 14; Creatinine, Ser 0.78; Potassium 4.6; Sodium 140    Lipid Panel    Component Value Date/Time   CHOL 111 07/04/2017 1535   TRIG 121.0 07/04/2017 1535   HDL 28.70 (L) 07/04/2017 1535   CHOLHDL 4 07/04/2017 1535   VLDL 24.2 07/04/2017 1535   LDLCALC 58 07/04/2017 1535   LDLDIRECT 198.2 07/11/2009 0925      Wt Readings from Last 3 Encounters:  09/05/17 258 lb (117 kg)  08/10/17 249 lb 12.8 oz (113.3 kg)  07/04/17 248 lb (112.5 kg)      Other studies Reviewed: Echocardiogram July 09, 2017 Procedure narrative: Transthoracic echocardiography for left   ventricular function evaluation, for right ventricular function   evaluation, and for  assessment of valvular function. Image   quality was suboptimal. Intravenous contrast (Definity) was   administered to enhance regional wall motion assessment and   opacify the LV. - Left ventricle: The cavity size was mildly dilated. Wall   thickness was increased in a pattern of mild LVH. Systolic   function was mildly reduced. The estimated ejection fraction was   in the range of 45% to 50%. Basal to mid inferior hypokinesis to   akinesis. Doppler parameters are consistent with abnormal left   ventricular relaxation (grade 1 diastolic dysfunction). The E/e&'   ratio is >15, suggesting elevated LV filling pressure. - Aortic valve: Mildly calcified leaflets. Mild to moderate   stenosis. Trivial regurgitation. Mean gradient (S): 17 mm Hg.   Peak  gradient (S): 33 mm Hg. Valve area (VTI): 1.7 cm^2. Valve   area (Vmax): 1.56 cm^2. Valve area (Vmean): 1.69 cm^2. - Mitral valve: Calcified annulus. Mildly thickened leaflets .   There was mild regurgitation. - Left atrium: Moderately dilated. - Tricuspid valve: There was trivial regurgitation. - Pulmonary arteries: PA peak pressure: 21 mm Hg (S) + RAP.   ASSESSMENT AND PLAN:  1. New onset atrial fib: Course on EKG, with what appears to be flutter waves but HR is irregular. RBBB is noted. Rate is controlled without AV nodal blocking agents. I will start him on Eliquis 5 mg BID. Check BMET, CBC, and TSH. He will return in one week to evaluate his response to Eliquis.  Consideration for DCCV on follow up after being on Eliquis for 4 weeks.   2. Acute Diastolic CHF: Significant tight edema of the bilateral LEE. I will begin lasix 40 mg daily for 3 days and then change to 20 mg daily. He will have potassium replacement 40 mEq on days he takes 40 mg lasix, and 20 mEq when he reduces the dose. Most recent echo revealed normal LV fx with moderately dilated LA. Will recheck his EKG on next visit.   3. CAD: Hx of CABG. He denies chest pain. Continue  current regimen.   4. Obesity: He will be checked for OSA.   Current medicines are reviewed at length with the patient today.    Labs/ tests ordered today include: BMET, CBC, TSH  [name]. [place] Pugh, ANP, AACC   09/05/2017 3:35 PM    [facility] [address], [place], [place] [address] Phone: [phone]; Fax: [phone]

## 2017-09-05 NOTE — Patient Instructions (Addendum)
Medication Instructions:  Stop aspirin 81mg    START PLAVIX 5MG  TWICE DAILY  START LASIX 20MG  DAILY-TAKE 40MG  X3DAYS THEN TAKE 20MG  DAILY  START POTASSIUM 20MG  X3DAYS THEN TAKE 20MG  DAILY  If you need a refill on your cardiac medications before your next appointment, please call your pharmacy.  Labwork: BMET,BNP AND CBC TODAY HERE IN OUR OFFICE AT [facility]  Take the provided lab slips with you to the lab for your blood draw.   Testing/Procedures: Your physician has recommended that you have a sleep study. This test records several body functions during sleep, including: brain activity, eye movement, oxygen and carbon dioxide blood levels, heart rate and rhythm, breathing rate and rhythm, the flow of air through your mouth and nose, snoring, body muscle movements, and chest and belly movement.  Special Instructions: PLEASE FOLLOW LOW SODIUM  DIET ATTACHED   Follow-Up: Your physician wants you to follow-up in: [place] (NURSE PRACTIONIER), DNP,AACC IF PRIMARY CARDIOLOGIST IS UNAVAILABLE.    Thank you for choosing [facility] at [employer]!!     LOW SODIUM DIET DASH Eating Plan DASH stands for "Dietary Approaches to Stop Hypertension." The DASH eating plan is a healthy eating plan that has been shown to reduce high blood pressure (hypertension). It may also reduce your risk for type 2 diabetes, heart disease, and stroke. The DASH eating plan may also help with weight loss. What are tips for following this plan? General guidelines  Avoid eating more than 2,300 mg (milligrams) of salt (sodium) a day. If you have hypertension, you may need to reduce your sodium intake to 1,500 mg a day.  Limit alcohol intake to no more than 1 drink a day for nonpregnant women and 2 drinks a day for men. One drink equals 12 oz of beer, 5 oz of wine, or 1 oz of hard liquor.  Work with your health care provider to maintain a healthy body weight or to lose weight. Ask what an ideal  weight is for you.  Get at least 30 minutes of exercise that causes your heart to beat faster (aerobic exercise) most days of the week. Activities may include walking, swimming, or biking.  Work with your health care provider or diet and nutrition specialist (dietitian) to adjust your eating plan to your individual calorie needs. Reading food labels  Check food labels for the amount of sodium per serving. Choose foods with less than 5 percent of the Daily Value of sodium. Generally, foods with less than 300 mg of sodium per serving fit into this eating plan.  To find whole grains, look for the word "whole" as the first word in the ingredient list. Shopping  Buy products labeled as "low-sodium" or "no salt added."  Buy fresh foods. Avoid canned foods and premade or frozen meals. Cooking  Avoid adding salt when cooking. Use salt-free seasonings or herbs instead of table salt or sea salt. Check with your health care provider or pharmacist before using salt substitutes.  Do not fry foods. Cook foods using healthy methods such as baking, boiling, grilling, and broiling instead.  Cook with heart-healthy oils, such as olive, canola, soybean, or sunflower oil. Meal planning   Eat a balanced diet that includes: ? 5 or more servings of fruits and vegetables each day. At each meal, try to fill half of your plate with fruits and vegetables. ? Up to 6-8 servings of whole grains each day. ? Less than 6 oz of lean meat, poultry, or fish each  day. A 3-oz serving of meat is about the same size as a deck of cards. One egg equals 1 oz. ? 2 servings of low-fat dairy each day. ? A serving of nuts, seeds, or beans 5 times each week. ? Heart-healthy fats. Healthy fats called Omega-3 fatty acids are found in foods such as flaxseeds and coldwater fish, like sardines, salmon, and mackerel.  Limit how much you eat of the following: ? Canned or prepackaged foods. ? Food that is high in trans fat, such as  fried foods. ? Food that is high in saturated fat, such as fatty meat. ? Sweets, desserts, sugary drinks, and other foods with added sugar. ? Full-fat dairy products.  Do not salt foods before eating.  Try to eat at least 2 vegetarian meals each week.  Eat more home-cooked food and less restaurant, buffet, and fast food.  When eating at a restaurant, ask that your food be prepared with less salt or no salt, if possible. What foods are recommended? The items listed may not be a complete list. Talk with your dietitian about what dietary choices are best for you. Grains Whole-grain or whole-wheat bread. Whole-grain or whole-wheat pasta. Brown rice. [place] Morrow. Bulgur. Whole-grain and low-sodium cereals. Pita bread. Low-fat, low-sodium crackers. Whole-wheat flour tortillas. Vegetables Fresh or frozen vegetables (raw, steamed, roasted, or grilled). Low-sodium or reduced-sodium tomato and vegetable juice. Low-sodium or reduced-sodium tomato sauce and tomato paste. Low-sodium or reduced-sodium canned vegetables. Fruits All fresh, dried, or frozen fruit. Canned fruit in natural juice (without added sugar). Meat and other protein foods Skinless chicken or [place]. Ground chicken or [place]. Pork with fat trimmed off. Fish and seafood. Egg whites. Dried beans, peas, or lentils. Unsalted nuts, nut butters, and seeds. Unsalted canned beans. Lean cuts of beef with fat trimmed off. Low-sodium, lean deli meat. Dairy Low-fat (1%) or fat-free (skim) milk. Fat-free, low-fat, or reduced-fat cheeses. Nonfat, low-sodium ricotta or cottage cheese. Low-fat or nonfat yogurt. Low-fat, low-sodium cheese. Fats and oils Soft margarine without trans fats. Vegetable oil. Low-fat, reduced-fat, or light mayonnaise and salad dressings (reduced-sodium). Canola, safflower, olive, soybean, and sunflower oils. Avocado. Seasoning and other foods Herbs. Spices. Seasoning mixes without salt. Unsalted popcorn and pretzels.  Fat-free sweets. What foods are not recommended? The items listed may not be a complete list. Talk with your dietitian about what dietary choices are best for you. Grains Baked goods made with fat, such as croissants, muffins, or some breads. Dry pasta or rice meal packs. Vegetables Creamed or fried vegetables. Vegetables in a cheese sauce. Regular canned vegetables (not low-sodium or reduced-sodium). Regular canned tomato sauce and paste (not low-sodium or reduced-sodium). Regular tomato and vegetable juice (not low-sodium or reduced-sodium). Angie Fava. Olives. Fruits Canned fruit in a light or heavy syrup. Fried fruit. Fruit in cream or butter sauce. Meat and other protein foods Fatty cuts of meat. Ribs. Fried meat. Berniece Salines. Sausage. Bologna and other processed lunch meats. Salami. Fatback. Hotdogs. Bratwurst. Salted nuts and seeds. Canned beans with added salt. Canned or smoked fish. Whole eggs or egg yolks. Chicken or [place] with skin. Dairy Whole or 2% milk, cream, and half-and-half. Whole or full-fat cream cheese. Whole-fat or sweetened yogurt. Full-fat cheese. Nondairy creamers. Whipped toppings. Processed cheese and cheese spreads. Fats and oils Butter. Stick margarine. Lard. Shortening. Ghee. Bacon fat. Tropical oils, such as coconut, palm kernel, or palm oil. Seasoning and other foods Salted popcorn and pretzels. Onion salt, garlic salt, seasoned salt, table salt, and sea salt. Worcestershire sauce.  Tartar sauce. Barbecue sauce. Teriyaki sauce. Soy sauce, including reduced-sodium. Steak sauce. Canned and packaged gravies. Fish sauce. Oyster sauce. Cocktail sauce. Horseradish that you find on the shelf. Ketchup. Mustard. Meat flavorings and tenderizers. Bouillon cubes. Hot sauce and Tabasco sauce. Premade or packaged marinades. Premade or packaged taco seasonings. Relishes. Regular salad dressings. Where to find more information:  National Heart, Lung, and [place]:  https://wilson-eaton.com/  American Heart Association: www.heart.org Summary  The DASH eating plan is a healthy eating plan that has been shown to reduce high blood pressure (hypertension). It may also reduce your risk for type 2 diabetes, heart disease, and stroke.  With the DASH eating plan, you should limit salt (sodium) intake to 2,300 mg a day. If you have hypertension, you may need to reduce your sodium intake to 1,500 mg a day.  When on the DASH eating plan, aim to eat more fresh fruits and vegetables, whole grains, lean proteins, low-fat dairy, and heart-healthy fats.  Work with your health care provider or diet and nutrition specialist (dietitian) to adjust your eating plan to your individual calorie needs. This information is not intended to replace advice given to you by your health care provider. Make sure you discuss any questions you have with your health care provider. Document Released: 01/07/2011 Document Revised: 01/12/2016 Document Reviewed: 01/12/2016 Elsevier Interactive Patient Education  [employer].

## 2017-09-05 NOTE — Telephone Encounter (Signed)
SPOKE TO PATIENT STATES HE HAS GAINED @8  LBS IN THE LAST WEEK . DID NOT GIVE ME  ACTUAL NUMBERS.  NOTICE SWELLING BEFORE WEEKEND-  NO DIET CHANGE OR TRAVELING PER PATIENT.  PER PATIENT SHORT OF BREATHE WITH SITTING, LAYING DOWN AND WALKING. PATIENT WAS NOT SHORT OF BREATH E  WHILE TALKING TO RN.  APPOINTMENT  SCHEDULE FOR TODAY AT 2 PM . PATIENT VERBA;IZED UNDERSTANDING.

## 2017-09-06 ENCOUNTER — Other Ambulatory Visit: Payer: Self-pay

## 2017-09-06 ENCOUNTER — Other Ambulatory Visit: Payer: Self-pay | Admitting: Internal Medicine

## 2017-09-06 DIAGNOSIS — Z79899 Other long term (current) drug therapy: Secondary | ICD-10-CM

## 2017-09-06 LAB — CBC
Hematocrit: 35.7 % — ABNORMAL LOW (ref 37.5–51.0)
Hemoglobin: 11.9 g/dL — ABNORMAL LOW (ref 13.0–17.7)
MCH: 30.7 pg (ref 26.6–33.0)
MCHC: 33.3 g/dL (ref 31.5–35.7)
MCV: 92 fL (ref 79–97)
Platelets: 91 10*3/uL — CL (ref 150–450)
RBC: 3.88 x10E6/uL — ABNORMAL LOW (ref 4.14–5.80)
RDW: 14.4 % (ref 12.3–15.4)
WBC: 4.4 10*3/uL (ref 3.4–10.8)

## 2017-09-06 LAB — BASIC METABOLIC PANEL
BUN/Creatinine Ratio: 14 (ref 10–24)
BUN: 11 mg/dL (ref 8–27)
CO2: 24 mmol/L (ref 20–29)
Calcium: 9.1 mg/dL (ref 8.6–10.2)
Chloride: 101 mmol/L (ref 96–106)
Creatinine, Ser: 0.78 mg/dL (ref 0.76–1.27)
GFR calc Af Amer: 108 mL/min/{1.73_m2} (ref >59)
GFR calc non Af Amer: 93 mL/min/{1.73_m2} (ref >59)
Glucose: 159 mg/dL — ABNORMAL HIGH (ref 65–99)
Potassium: 4.4 mmol/L (ref 3.5–5.2)
Sodium: 139 mmol/L (ref 134–144)

## 2017-09-06 LAB — BRAIN NATRIURETIC PEPTIDE: BNP: 204.2 pg/mL — ABNORMAL HIGH (ref 0.0–100.0)

## 2017-09-07 ENCOUNTER — Telehealth: Payer: Self-pay | Admitting: *Deleted

## 2017-09-07 NOTE — Telephone Encounter (Signed)
PA request submitted to [facility] via web portal and fax for in lab sleep study.

## 2017-09-07 NOTE — Telephone Encounter (Signed)
-----   Message from Waylan Rocher, LPN sent at 0/04/5246  3:39 PM EDT ----- Regarding: sleep study Mariann Laster I ordered sleep study

## 2017-09-09 ENCOUNTER — Other Ambulatory Visit: Payer: Self-pay | Admitting: *Deleted

## 2017-09-09 DIAGNOSIS — Z79899 Other long term (current) drug therapy: Secondary | ICD-10-CM

## 2017-09-10 LAB — BASIC METABOLIC PANEL
BUN/Creatinine Ratio: 13 (ref 10–24)
BUN: 11 mg/dL (ref 8–27)
CO2: 26 mmol/L (ref 20–29)
Calcium: 9.7 mg/dL (ref 8.6–10.2)
Chloride: 101 mmol/L (ref 96–106)
Creatinine, Ser: 0.86 mg/dL (ref 0.76–1.27)
GFR calc Af Amer: 104 mL/min/{1.73_m2} (ref >59)
GFR calc non Af Amer: 90 mL/min/{1.73_m2} (ref >59)
Glucose: 112 mg/dL — ABNORMAL HIGH (ref 65–99)
Potassium: 5.3 mmol/L — ABNORMAL HIGH (ref 3.5–5.2)
Sodium: 140 mmol/L (ref 134–144)

## 2017-09-12 ENCOUNTER — Telehealth: Payer: Self-pay | Admitting: Adult Health

## 2017-09-12 NOTE — Telephone Encounter (Signed)
New Message   Pt returning call for nurse. Please call

## 2017-09-13 ENCOUNTER — Encounter: Payer: Self-pay | Admitting: Adult Health

## 2017-09-13 ENCOUNTER — Ambulatory Visit: Payer: 59 | Admitting: Adult Health

## 2017-09-13 VITALS — BP 120/66 | HR 68 | Ht 66.0 in | Wt 240.0 lb

## 2017-09-13 DIAGNOSIS — I358 Other nonrheumatic aortic valve disorders: Secondary | ICD-10-CM | POA: Diagnosis not present

## 2017-09-13 DIAGNOSIS — I4891 Unspecified atrial fibrillation: Secondary | ICD-10-CM | POA: Diagnosis not present

## 2017-09-13 DIAGNOSIS — I2510 Atherosclerotic heart disease of native coronary artery without angina pectoris: Secondary | ICD-10-CM | POA: Diagnosis not present

## 2017-09-13 DIAGNOSIS — I10 Essential (primary) hypertension: Secondary | ICD-10-CM

## 2017-09-13 DIAGNOSIS — I5042 Chronic combined systolic (congestive) and diastolic (congestive) heart failure: Secondary | ICD-10-CM

## 2017-09-13 NOTE — Patient Instructions (Signed)
Medication Instructions:  START ELIQUIS 5MG  TWICE DAILY  If you need a refill on your cardiac medications before your next appointment, please call your pharmacy.  LaYour physician wants you to follow-up On: 11-10-2017 @ 840 AM WITH DR CRENSHAW    Thank you for choosing [facility] at [facility]!!

## 2017-09-13 NOTE — Progress Notes (Signed)
Cardiology Office Note   Date:  09/13/2017   ID:  Tony, Foster [birth year], MRN [id number]  PCP:  Marletta Lor, MD  Cardiologist: [facility]  Chief Complaint  Patient presents with  . Follow-up  . Atrial Fibrillation  . Coronary Artery Disease  . Congestive Heart Failure     History of Present Illness: [name] is a 67 y.o. male who presents for ongoing assessment and management of CAD, s/p CABG in 2006 (LIMA to LAD SVG to diagonal, SVG to acute marginal and SVG to PDA), hypercholesterolemia, AoV stenosis, and hypertension. A sleep study was ordered on 09/07/2017   When last seen in the office, he was found to have new onset course atrial fib. He was also having some fluid overload. He was started on lasix 40 mg TID for 3 days and then change to 20 mg daily thereafter. He was also started on Eliquis 5 mg BID. He is here for follow up and response to treatment.   Since being seen last he has diuresed 18 lbs, heart rate remains stable. He is feeling much better, breathing better. Unfortunately even with samples, he did not take the Eliquis as he thought it was the same thing as other medications.   Past Medical History:  Diagnosis Date  . Adjustment disorder with depressed mood 09/07/2007  . Colon polyps    Tubular Adenoma 2010  . CONGESTIVE HEART FAILURE 12/09/2008  . CORONARY ARTERY DISEASE 2006  . HYPERLIPIDEMIA 08/09/2006  . HYPERTENSION 08/09/2006  . MYOCARDIAL INFARCTION, HX OF 07/07/2004  . 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 OBESITY 07/19/2008    Past Surgical History:  Procedure Laterality Date  . COLONOSCOPY    . CORONARY ARTERY BYPASS GRAFT  2006     Current Outpatient Medications  Medication Sig Dispense Refill  . apixaban (ELIQUIS) 5 MG TABS tablet Take 1 tablet (5 mg total) by mouth 2 (two) times daily. 60 tablet 1  . atorvastatin (LIPITOR) 80 MG tablet TAKE 1 TABLET BY MOUTH EVERY DAY 30 tablet 8  . furosemide (LASIX) 20 MG tablet Take 1  tablet (20 mg total) by mouth daily. 40mg  daily x3days w/k+ then down to 20mg  daily (Patient taking differently: Take 20 mg by mouth daily. ) 33 tablet 3  . metoprolol succinate (TOPROL-XL) 50 MG 24 hr tablet TAKE 1 TABLET BY MOUTH EVERY DAY 30 tablet 8  . polyethylene glycol powder (GLYCOLAX/MIRALAX) powder Take 17 g by mouth 2 (two) times daily as needed. 3350 g 3  . potassium chloride SA (KLOR-CON M20) 20 MEQ tablet Take 1 tablet (20 mEq total) by mouth daily. 40mg  daily x3days w/lasix 40mg  then down to 20mg  daily (Patient taking differently: Take 20 mEq by mouth daily. ) 33 tablet 3  . tamsulosin (FLOMAX) 0.4 MG CAPS capsule TAKE ONE CAPSULE BY MOUTH EVERY DAY AFTER SUPPER 30 capsule 8   Current Facility-Administered Medications  Medication Dose Route Frequency Provider Last Rate Last Dose  . 0.9 %  sodium chloride infusion  500 mL Intravenous Continuous Armbruster, Carlota Raspberry, MD      . 0.9 %  sodium chloride infusion  500 mL Intravenous Once Armbruster, Carlota Raspberry, MD        Allergies:   Patient has no known allergies.    Social History:  The patient  reports that he has never smoked. He has never used smokeless tobacco. He reports that he drinks about 1.0 standard drinks of alcohol per week. He reports that he  does not use drugs.   Family History:  The patient's family history includes Bipolar disorder in his brother; Heart disease in his father; Hyperlipidemia in his mother; Hypertension in his mother; Ulcers in his father.    ROS: All other systems are reviewed and negative. Unless otherwise mentioned in H&P    PHYSICAL EXAM: VS:  BP 120/66   Pulse 68   Ht 5\' 6"  (1.676 m)   Wt 240 lb (108.9 kg)   BMI 38.74 kg/m  , BMI Body mass index is 38.74 kg/m. GEN: Well nourished, well developed, in no acute distress  HEENT: normal  Neck: no JVD, carotid bruits, or masses Cardiac: IRRR; 2/6 systolic  murmurs, rubs, or gallops,no edema  Respiratory:  clear to auscultation bilaterally,  normal work of breathing GI: soft, nontender, nondistended, + BS MS: no deformity or atrophy  Skin: warm and dry, no rash Neuro:  Strength and sensation are intact Psych: euthymic mood, full affect   EKG:  Coarse atrial fibrillation RBBB, LAFB, rate of 68 bpm.   Recent Labs: 07/04/2017: ALT 30; TSH 2.70 09/05/2017: BNP 204.2; Hemoglobin 11.9; Platelets 91 09/09/2017: BUN 11; Creatinine, Ser 0.86; Potassium 5.3; Sodium 140    Lipid Panel    Component Value Date/Time   CHOL 111 07/04/2017 1535   TRIG 121.0 07/04/2017 1535   HDL 28.70 (L) 07/04/2017 1535   CHOLHDL 4 07/04/2017 1535   VLDL 24.2 07/04/2017 1535   LDLCALC 58 07/04/2017 1535   LDLDIRECT 198.2 07/11/2009 0925      Wt Readings from Last 3 Encounters:  09/13/17 240 lb (108.9 kg)  09/05/17 258 lb (117 kg)  08/10/17 249 lb 12.8 oz (113.3 kg)      Other studies Reviewed: Echocardiogram 07-18-2017  Procedure narrative: Transthoracic echocardiography for left   ventricular function evaluation, for right ventricular function   evaluation, and for assessment of valvular function. Image   quality was suboptimal. Intravenous contrast (Definity) was   administered to enhance regional wall motion assessment and   opacify the LV. - Left ventricle: The cavity size was mildly dilated. Wall   thickness was increased in a pattern of mild LVH. Systolic   function was mildly reduced. The estimated ejection fraction was   in the range of 45% to 50%. Basal to mid inferior hypokinesis to   akinesis. Doppler parameters are consistent with abnormal left   ventricular relaxation (grade 1 diastolic dysfunction). The E/e&'   ratio is >15, suggesting elevated LV filling pressure. - Aortic valve: Mildly calcified leaflets. Mild to moderate   stenosis. Trivial regurgitation. Mean gradient (S): 17 mm Hg.   Peak gradient (S): 33 mm Hg. Valve area (VTI): 1.7 cm^2. Valve   area (Vmax): 1.56 cm^2. Valve area (Vmean): 1.69 cm^2. - Mitral valve:  Calcified annulus. Mildly thickened leaflets .   There was mild regurgitation. - Left atrium: Moderately dilated. - Tricuspid valve: There was trivial regurgitation. - Pulmonary arteries: PA peak pressure: 21 mm Hg (S) + RAP.  Stress Test 09/27/2013 Impression Exercise Capacity:  Fair exercise capacity. BP Response:  Transient reduction in BP at peak exercise - mild and of uncertain significance Clinical Symptoms:  No significant symptoms noted. ECG Impression:  No significant ST segment change suggestive of ischemia. Comparison with Prior Nuclear Study: No previous nuclear study performed  ASSESSMENT AND PLAN:  1.  Coarse Atrial fib: He is feeling better. Unfortunately he did not take the Eliquis. I have instructed him on the importance of taking this medication to  prevent stroke. CHADS VASC Score of 4-5. I have given samples again and he will fill his Rx for this. Cannot talk about DCCV until he is consistently taking anticoagulant.   2. CHF: Volume overload has resolved. He has diuresed 18 lbs on lasix. I will continue lasix 20 mg daily with potassium supplement. He is to avoid salt intake.   3.  CAD: He has hx of CABG in 2006. He may need further ischemic testing since it has been over 13 years since CABG. Most recent stress test was on 09/27/2013 and was normal.   4. Probable OSA: Prior authorization is underway to proceed with sleep study.   5. AoV stenosis: Per echo 07/08/2017 LVEF of 455-50% Aortic valve mild to moderate stenosis. Mean gradient (S): 17 mm Hg. Peak gradient (S): 33 mm Hg. valve area (VTI): 1.7 cm^2. valve area (Vmax): 1.56 cm^2. valve area (Vmean): 1.69 cm^2.  He will discuss this further with Dr. Stanford Breed on follow up.   Current medicines are reviewed at length with the patient today.    Labs/ tests ordered today include: none   Phill Myron. West Pugh, ANP, AACC   09/13/2017 2:26 PM    [facility] [address], [place], [place]  [address] Phone: [phone]; Fax: [phone]

## 2017-09-14 ENCOUNTER — Telehealth: Payer: Self-pay | Admitting: *Deleted

## 2017-09-14 NOTE — Telephone Encounter (Signed)
Patient notified of sleep study appointment. 

## 2017-10-04 ENCOUNTER — Ambulatory Visit (HOSPITAL_BASED_OUTPATIENT_CLINIC_OR_DEPARTMENT_OTHER): Payer: 59 | Attending: Adult Health | Admitting: Cardiovascular Disease

## 2017-10-04 VITALS — Ht 66.0 in | Wt 235.0 lb

## 2017-10-04 DIAGNOSIS — R0600 Dyspnea, unspecified: Secondary | ICD-10-CM

## 2017-10-04 DIAGNOSIS — G4733 Obstructive sleep apnea (adult) (pediatric): Secondary | ICD-10-CM

## 2017-10-04 DIAGNOSIS — R0602 Shortness of breath: Secondary | ICD-10-CM | POA: Diagnosis present

## 2017-10-04 DIAGNOSIS — I4891 Unspecified atrial fibrillation: Secondary | ICD-10-CM

## 2017-10-04 DIAGNOSIS — Z79899 Other long term (current) drug therapy: Secondary | ICD-10-CM | POA: Insufficient documentation

## 2017-10-11 ENCOUNTER — Ambulatory Visit: Payer: 59 | Admitting: Internal Medicine

## 2017-10-11 ENCOUNTER — Encounter: Payer: Self-pay | Admitting: Internal Medicine

## 2017-10-11 VITALS — BP 100/58 | HR 70 | Temp 98.6°F | Wt 240.0 lb

## 2017-10-11 DIAGNOSIS — I350 Nonrheumatic aortic (valve) stenosis: Secondary | ICD-10-CM | POA: Insufficient documentation

## 2017-10-11 DIAGNOSIS — I10 Essential (primary) hypertension: Secondary | ICD-10-CM

## 2017-10-11 DIAGNOSIS — E785 Hyperlipidemia, unspecified: Secondary | ICD-10-CM

## 2017-10-11 DIAGNOSIS — I2510 Atherosclerotic heart disease of native coronary artery without angina pectoris: Secondary | ICD-10-CM | POA: Diagnosis not present

## 2017-10-11 NOTE — Progress Notes (Signed)
Subjective:    Patient ID: [name], male    DOB: Dec 12, 1950, 67 y.o.   MRN: [id number]  HPI  67 year old patient who is seen today in follow-up.  He is followed closely by cardiology with history of coronary artery disease with congestive heart failure. Doing quite well today remains active with golf 2 times per week Denies any shortness of breath  Past Medical History:  Diagnosis Date  . Adjustment disorder with depressed mood 09/07/2007  . Colon polyps    Tubular Adenoma 2010  . CONGESTIVE HEART FAILURE 12/09/2008  . CORONARY ARTERY DISEASE 2006  . HYPERLIPIDEMIA 08/09/2006  . HYPERTENSION 08/09/2006  . MYOCARDIAL INFARCTION, HX OF 07/07/2004  . 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 OBESITY 07/19/2008     Social History   Socioeconomic History  . Marital status: Single    Spouse name: Not on file  . Number of children: Not on file  . Years of education: Not on file  . Highest education level: Not on file  Occupational History  . Not on file  Social Needs  . Financial resource strain: Not on file  . Food insecurity:    Worry: Not on file    Inability: Not on file  . Transportation needs:    Medical: Not on file    Non-medical: Not on file  Tobacco Use  . Smoking status: Never Smoker  . Smokeless tobacco: Never Used  Substance and Sexual Activity  . Alcohol use: Yes    Alcohol/week: 1.0 standard drinks    Types: 1 Cans of beer per week    Comment: occasional  . Drug use: No  . Sexual activity: Not on file  Lifestyle  . Physical activity:    Days per week: Not on file    Minutes per session: Not on file  . Stress: Not on file  Relationships  . Social connections:    Talks on phone: Not on file    Gets together: Not on file    Attends religious service: Not on file    Active member of club or organization: Not on file    Attends meetings of clubs or organizations: Not on file    Relationship status: Not on file  . Intimate partner violence:    Fear of  current or ex partner: Not on file    Emotionally abused: Not on file    Physically abused: Not on file    Forced sexual activity: Not on file  Other Topics Concern  . Not on file  Social History Narrative  . Not on file    Past Surgical History:  Procedure Laterality Date  . COLONOSCOPY    . CORONARY ARTERY BYPASS GRAFT  2006    Family History  Problem Relation Age of Onset  . Hypertension Mother   . Hyperlipidemia Mother   . Heart disease Father   . Ulcers Father   . Bipolar disorder Brother   . Colon cancer Neg Hx     No Known Allergies  Current Outpatient Medications on File Prior to Visit  Medication Sig Dispense Refill  . apixaban (ELIQUIS) 5 MG TABS tablet Take 1 tablet (5 mg total) by mouth 2 (two) times daily. 60 tablet 1  . atorvastatin (LIPITOR) 80 MG tablet TAKE 1 TABLET BY MOUTH EVERY DAY 30 tablet 8  . furosemide (LASIX) 20 MG tablet Take 1 tablet (20 mg total) by mouth daily. 40mg  daily x3days w/k+ then down to 20mg  daily (Patient taking  differently: Take 20 mg by mouth daily. ) 33 tablet 3  . metoprolol succinate (TOPROL-XL) 50 MG 24 hr tablet TAKE 1 TABLET BY MOUTH EVERY DAY 30 tablet 8  . polyethylene glycol powder (GLYCOLAX/MIRALAX) powder Take 17 g by mouth 2 (two) times daily as needed. 3350 g 3  . potassium chloride SA (KLOR-CON M20) 20 MEQ tablet Take 1 tablet (20 mEq total) by mouth daily. 40mg  daily x3days w/lasix 40mg  then down to 20mg  daily (Patient taking differently: Take 20 mEq by mouth daily. ) 33 tablet 3  . tamsulosin (FLOMAX) 0.4 MG CAPS capsule TAKE ONE CAPSULE BY MOUTH EVERY DAY AFTER SUPPER 30 capsule 8   No current facility-administered medications on file prior to visit.     BP (!) 100/58 (BP Location: Right Arm, Patient Position: Sitting, Cuff Size: Large)   Pulse 70   Temp 98.6 F (37 C) (Oral)   Wt 240 lb (108.9 kg)   SpO2 96%   BMI 38.74 kg/m     Review of Systems  Constitutional: Negative for appetite change, chills,  fatigue and fever.  HENT: Negative for congestion, dental problem, ear pain, hearing loss, sore throat, tinnitus, trouble swallowing and voice change.   Eyes: Negative for pain, discharge and visual disturbance.  Respiratory: Negative for cough, chest tightness, wheezing and stridor.   Cardiovascular: Positive for leg swelling. Negative for chest pain and palpitations.  Gastrointestinal: Negative for abdominal distention, abdominal pain, blood in stool, constipation, diarrhea, nausea and vomiting.  Genitourinary: Negative for difficulty urinating, discharge, flank pain, genital sores, hematuria and urgency.  Musculoskeletal: Negative for arthralgias, back pain, gait problem, joint swelling, myalgias and neck stiffness.  Skin: Negative for rash.  Neurological: Negative for dizziness, syncope, speech difficulty, weakness, numbness and headaches.  Hematological: Negative for adenopathy. Does not bruise/bleed easily.  Psychiatric/Behavioral: Negative for behavioral problems and dysphoric mood. The patient is not nervous/anxious.        Objective:   Physical Exam  Constitutional: He is oriented to person, place, and time. He appears well-developed.  HENT:  Head: Normocephalic.  Right Ear: External ear normal.  Left Ear: External ear normal.  Eyes: Conjunctivae and EOM are normal.  Neck: Normal range of motion.  Cardiovascular: Normal rate.  Murmur heard. Grade 2/6 to 3/6 systolic murmur loudest at the base  Pulmonary/Chest: Breath sounds normal.  Abdominal: Bowel sounds are normal.  Musculoskeletal: Normal range of motion. He exhibits edema. He exhibits no tenderness.  +2 lower extremity edema  Neurological: He is alert and oriented to person, place, and time.  Psychiatric: He has a normal mood and affect. His behavior is normal.          Assessment & Plan:   Coronary artery disease.  Status post CABG Aortic stenosis Dyslipidemia continue statin therapy History of atrial  fibrillation.  Continue chronic anticoagulation History of congestive heart failure.  Lower extremity edema.  Continue furosemide.  In view of history of renal insufficiency will not uptitrate diuretic therapy at this time.  Follow-up cardiology Rule out OSA.  Sleep study performed but results pending  Follow-up with cardiology as scheduled Patient will follow-up with new PCP in 6 months  Marletta Lor

## 2017-10-11 NOTE — Patient Instructions (Signed)
Limit your sodium (Salt) intake  Cardiology follow-up as scheduled    It is important that you exercise regularly, at least 20 minutes 3 to 4 times per week.  If you develop chest pain or shortness of breath seek  medical attention.  You need to lose weight.  Consider a lower calorie diet and regular exercise.  More golf.  BEST WISHES!!!

## 2017-10-24 ENCOUNTER — Telehealth: Payer: Self-pay | Admitting: *Deleted

## 2017-10-24 ENCOUNTER — Encounter (HOSPITAL_BASED_OUTPATIENT_CLINIC_OR_DEPARTMENT_OTHER): Payer: Self-pay | Admitting: Cardiovascular Disease

## 2017-10-24 NOTE — Telephone Encounter (Signed)
Patient notified of sleep study results and recommendations. CPAP referral sent to [facility].

## 2017-10-24 NOTE — Progress Notes (Signed)
Patient notified of sleep study results and recommendations. 

## 2017-10-24 NOTE — Procedures (Signed)
Patient Name: [name], Gloss Date: 10/04/2017 Gender: Male D.O.B: 1950-06-09 Age (years): 72 Referring Provider: Jory Sims NP Height (inches): 75 Interpreting Physician: Shelva Majestic MD, ABSM Weight (lbs): 235 RPSGT: Carolin Coy BMI: 38 MRN: [id number] Neck Size: 16.00  CLINICAL INFORMATION Sleep Study Type: Split Night CPAP  Indication for sleep study: Congestive Heart Failure, Excessive Daytime Sleepiness, Fatigue, Hypertension, Obesity, Snoring  Epworth Sleepiness Score: 9  SLEEP STUDY TECHNIQUE As per the AASM Manual for the Scoring of Sleep and Associated Events v2.3 (April 2016) with a hypopnea requiring 4% desaturations.  The channels recorded and monitored were frontal, central and occipital EEG, electrooculogram (EOG), submentalis EMG (chin), nasal and oral airflow, thoracic and abdominal wall motion, anterior tibialis EMG, snore microphone, electrocardiogram, and pulse oximetry. Continuous positive airway pressure (CPAP) was initiated when the patient met split night criteria and was titrated according to treat sleep-disordered breathing.  MEDICATIONS     apixaban (ELIQUIS) 5 MG TABS tablet         atorvastatin (LIPITOR) 80 MG tablet         furosemide (LASIX) 20 MG tablet         metoprolol succinate (TOPROL-XL) 50 MG 24 hr tablet         polyethylene glycol powder (GLYCOLAX/MIRALAX) powder         potassium chloride SA (KLOR-CON M20) 20 MEQ tablet         tamsulosin (FLOMAX) 0.4 MG CAPS capsule      Medications self-administered by patient taken the night of the study : N/A  RESPIRATORY PARAMETERS Diagnostic Total AHI (/hr): 35.0 RDI (/hr): 38.1 OA Index (/hr): 1.5 CA Index (/hr): 0.0 REM AHI (/hr): 41.9 NREM AHI (/hr): 33.3 Supine AHI (/hr): 41.9 Non-supine AHI (/hr): 32.4 Min O2 Sat (%): 84.0 Mean O2 (%): 93.1 Time below 88% (min): 4.1   Titration Optimal Pressure (cm): 10 AHI at Optimal Pressure (/hr): 0.0 Min O2 at Optimal  Pressure (%): 92.0 Supine % at Optimal (%): 100 Sleep % at Optimal (%): 79   SLEEP ARCHITECTURE The recording time for the entire night was 360.5 minutes.  During a baseline period of 181.1 minutes, the patient slept for 156.0 minutes in REM and nonREM, yielding a sleep efficiency of 86.1%%. Sleep onset after lights out was 3.9 minutes with a REM latency of 112.5 minutes. The patient spent 24.0%% of the night in stage N1 sleep, 55.8%% in stage N2 sleep, 0.0%% in stage N3 and 20.2% in REM.  During the titration period of 174.9 minutes, the patient slept for 148.0 minutes in REM and nonREM, yielding a sleep efficiency of 84.6%%. Sleep onset after CPAP initiation was 7.8 minutes with a REM latency of 63.5 minutes. The patient spent 22.0%% of the night in stage N1 sleep, 67.6%% in stage N2 sleep, 0.0%% in stage N3 and 10.5% in REM.  CARDIAC DATA The 2 lead EKG demonstrated atrial fibrillation. The mean heart rate was 100.0 beats per minute. Other EKG findings include: PVCs.  LEG MOVEMENT DATA The total Periodic Limb Movements of Sleep (PLMS) were 0. The PLMS index was 0.0 .  IMPRESSIONS - Severe obstructive sleep apnea occurred during the diagnostic portion of the study (AHI 35.0/h; RDI 38.1/h); events were more severe during REM sleep (AHI 41.9/h).  CPAP was intiated and was titrated up to 10 cm water.  AHI at 10 cm was 0; RDI 4.4/h, but REM sleep was not achieved at 10 cm. - No significant central sleep apnea  occurred during the diagnostic portion of the study (CAI = 0.0/hour). - Significant oxygen desaturation was noted during the diagnostic portion of the study (Min O2 84.0%). - The patient snored with moderate snoring volume during the diagnostic portion of the study. - EKG findings include PVCs. - Clinically significant periodic limb movements did not occur during sleep.  DIAGNOSIS - Obstructive Sleep Apnea (327.23 [G47.33 ICD-10])  RECOMMENDATIONS - Recommend an trial of CPAP Auto  therapy at 9 to 16 cm H2O with heated humidification. A Medium size Fisher&Paykel Full Face Mask Simplus mask was used for the titration. - Efforts should be made for nasal and oropharyngeal patency. - Avoid alcohol, sedatives and other CNS depressants that may worsen sleep apnea and disrupt normal sleep architecture. - Sleep hygiene should be reviewed to assess factors that may improve sleep quality. - Weight management and regular exercise should be initiated or continued. - Recommend a download be obtained in 30 days and sleep clinic evaluation after 4 weeks of therapy.  [Electronically signed] 10/24/2017 01:13 PM  Shelva Majestic MD, [facility], [place], American Board of Sleep Medicine   NPI: [id number] [place] PH: [phone]   FX: [phone] [place]

## 2017-10-24 NOTE — Telephone Encounter (Signed)
-----   Message from Troy Sine, MD sent at 10/24/2017  1:18 PM EDT ----- Mariann Laster, please notify patient of the results.  Contact DME company for initiation of CPAP auto.  Arrange for follow-up sleep clinic.

## 2017-11-03 NOTE — Progress Notes (Signed)
HPI: FU CAD; history of coronary artery disease, status post coronary artery bypass graft surgery. This was performed in June 2006. Patient had a LIMA to the LAD, saphenous vein graft to the diagonal, saphenous vein graft to the acute marginal and saphenous vein graft to the PDA. Carotid Dopplers August 2015 showed no significant stenosis. Nuclear study August 2015 showed ejection fraction 55% but no ischemia or infarction. Possible mild decrease in systolic blood pressure at peak exercise.  Abdominal ultrasound June 2017 showed no aneurysm.  Last echocardiogram June 2019 showed ejection fraction 45 to 50%, hypokinesis of the basal to mid inferior wall, mild diastolic dysfunction, mild to moderate aortic stenosis with mean gradient 17 mmHg, mild mitral regurgitation and moderate left atrial enlargement.  Patient seen in August with newly diagnosed atrial fibrillation.  He was volume overloaded and was diuresed with Lasix.  Since last seen patient states his dyspnea has improved.  No orthopnea, PND, chest pain, palpitations, syncope or bleeding.  Minimal pedal edema.  Current Outpatient Medications  Medication Sig Dispense Refill  . apixaban (ELIQUIS) 5 MG TABS tablet Take 1 tablet (5 mg total) by mouth 2 (two) times daily. 60 tablet 1  . atorvastatin (LIPITOR) 80 MG tablet TAKE 1 TABLET BY MOUTH EVERY DAY 30 tablet 8  . furosemide (LASIX) 20 MG tablet Take 1 tablet (20 mg total) by mouth daily. 40mg  daily x3days w/k+ then down to 20mg  daily (Patient taking differently: Take 20 mg by mouth daily. ) 33 tablet 3  . metoprolol succinate (TOPROL-XL) 50 MG 24 hr tablet TAKE 1 TABLET BY MOUTH EVERY DAY 30 tablet 8  . polyethylene glycol powder (GLYCOLAX/MIRALAX) powder Take 17 g by mouth 2 (two) times daily as needed. 3350 g 3  . potassium chloride SA (KLOR-CON M20) 20 MEQ tablet Take 1 tablet (20 mEq total) by mouth daily. 40mg  daily x3days w/lasix 40mg  then down to 20mg  daily (Patient taking  differently: Take 20 mEq by mouth daily. ) 33 tablet 3  . tamsulosin (FLOMAX) 0.4 MG CAPS capsule TAKE ONE CAPSULE BY MOUTH EVERY DAY AFTER SUPPER 30 capsule 8   No current facility-administered medications for this visit.      Past Medical History:  Diagnosis Date  . Adjustment disorder with depressed mood 09/07/2007  . Colon polyps    Tubular Adenoma 2010  . CONGESTIVE HEART FAILURE 12/09/2008  . CORONARY ARTERY DISEASE 2006  . HYPERLIPIDEMIA 08/09/2006  . HYPERTENSION 08/09/2006  . MYOCARDIAL INFARCTION, HX OF 07/07/2004  . 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 OBESITY 07/19/2008    Past Surgical History:  Procedure Laterality Date  . COLONOSCOPY    . CORONARY ARTERY BYPASS GRAFT  2006    Social History   Socioeconomic History  . Marital status: Single    Spouse name: Not on file  . Number of children: Not on file  . Years of education: Not on file  . Highest education level: Not on file  Occupational History  . Not on file  Social Needs  . Financial resource strain: Not on file  . Food insecurity:    Worry: Not on file    Inability: Not on file  . Transportation needs:    Medical: Not on file    Non-medical: Not on file  Tobacco Use  . Smoking status: Never Smoker  . Smokeless tobacco: Never Used  Substance and Sexual Activity  . Alcohol use: Yes    Alcohol/week: 1.0 standard drinks  Types: 1 Cans of beer per week    Comment: occasional  . Drug use: No  . Sexual activity: Not on file  Lifestyle  . Physical activity:    Days per week: Not on file    Minutes per session: Not on file  . Stress: Not on file  Relationships  . Social connections:    Talks on phone: Not on file    Gets together: Not on file    Attends religious service: Not on file    Active member of club or organization: Not on file    Attends meetings of clubs or organizations: Not on file    Relationship status: Not on file  . Intimate partner violence:    Fear of current or ex partner:  Not on file    Emotionally abused: Not on file    Physically abused: Not on file    Forced sexual activity: Not on file  Other Topics Concern  . Not on file  Social History Narrative  . Not on file    Family History  Problem Relation Age of Onset  . Hypertension Mother   . Hyperlipidemia Mother   . Heart disease Father   . Ulcers Father   . Bipolar disorder Brother   . Colon cancer Neg Hx     ROS: no fevers or chills, productive cough, hemoptysis, dysphasia, odynophagia, melena, hematochezia, dysuria, hematuria, rash, seizure activity, orthopnea, PND, claudication. Remaining systems are negative.  Physical Exam: Well-developed well-nourished in no acute distress.  Skin is warm and dry.  HEENT is normal.  Neck is supple.  Chest is clear to auscultation with normal expansion.  Cardiovascular exam is regular rate and rhythm.  2/6 systolic murmur left sternal border. Abdominal exam nontender or distended. No masses palpated. Extremities show trace edema. 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  His heart rate is mildly decreased.  He also has baseline conduction abnormalities.  Decrease Toprol to 25 mg daily.  Continue apixaban.  He states he has missed an occasional dose recently.  I have asked him to take this twice daily and we will arrange an elective cardioversion in 4 to 6 weeks.  Hold Toprol the day before and the day of cardioversion.  If he does not hold sinus rhythm then I would favor rate control and anticoagulation.  I have not referred for ablation as this appears to be atypical flutter.  2 coronary artery disease-no chest pain.  Continue medical therapy with statin.  No aspirin given need for anticoagulation.  3 aortic stenosis-mild to moderate on most recent echocardiogram.  We will plan repeat study  June 2020.  4 hypertension-blood pressure is controlled.  Continue present medications and follow.  5 hyperlipidemia-continue statin.  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  Euvolemic today.  Continue present dose of Lasix.  Check potassium and renal function.  Likely contribution of atrial fibrillation to congestive heart failure.  7 possible obstructive sleep apnea-management per Dr. Claiborne Billings.  Kirk Ruths, MD

## 2017-11-10 ENCOUNTER — Encounter: Payer: Self-pay | Admitting: Cardiology

## 2017-11-10 ENCOUNTER — Other Ambulatory Visit: Payer: Self-pay | Admitting: *Deleted

## 2017-11-10 ENCOUNTER — Ambulatory Visit: Payer: 59 | Admitting: Cardiology

## 2017-11-10 VITALS — BP 128/78 | Ht 66.0 in | Wt 235.6 lb

## 2017-11-10 DIAGNOSIS — I484 Atypical atrial flutter: Secondary | ICD-10-CM

## 2017-11-10 DIAGNOSIS — I10 Essential (primary) hypertension: Secondary | ICD-10-CM | POA: Diagnosis not present

## 2017-11-10 DIAGNOSIS — E7800 Pure hypercholesterolemia, unspecified: Secondary | ICD-10-CM | POA: Diagnosis not present

## 2017-11-10 DIAGNOSIS — I2510 Atherosclerotic heart disease of native coronary artery without angina pectoris: Secondary | ICD-10-CM | POA: Diagnosis not present

## 2017-11-10 LAB — BASIC METABOLIC PANEL
BUN/Creatinine Ratio: 17 (ref 10–24)
BUN: 13 mg/dL (ref 8–27)
CO2: 23 mmol/L (ref 20–29)
Calcium: 9.4 mg/dL (ref 8.6–10.2)
Chloride: 99 mmol/L (ref 96–106)
Creatinine, Ser: 0.78 mg/dL (ref 0.76–1.27)
GFR calc Af Amer: 107 mL/min/{1.73_m2} (ref >59)
GFR calc non Af Amer: 93 mL/min/{1.73_m2} (ref >59)
Glucose: 118 mg/dL — ABNORMAL HIGH (ref 65–99)
Potassium: 4.9 mmol/L (ref 3.5–5.2)
Sodium: 137 mmol/L (ref 134–144)

## 2017-11-10 MED ORDER — FUROSEMIDE 20 MG PO TABS: 20.0000 mg | ORAL_TABLET | Freq: Every day | ORAL | 3 refills | Status: DC

## 2017-11-10 MED ORDER — APIXABAN 5 MG PO TABS: 5.0000 mg | ORAL_TABLET | Freq: Two times a day (BID) | ORAL | 1 refills | Status: DC

## 2017-11-10 MED ORDER — POTASSIUM CHLORIDE CRYS ER 20 MEQ PO TBCR: 20.0000 meq | EXTENDED_RELEASE_TABLET | Freq: Every day | ORAL | 3 refills | Status: DC

## 2017-11-10 MED ORDER — METOPROLOL SUCCINATE ER 25 MG PO TB24: 25.0000 mg | ORAL_TABLET | Freq: Every day | ORAL | 3 refills | Status: DC

## 2017-11-10 NOTE — Patient Instructions (Signed)
Medication Instructions:  DECREASE METOPROLOL TO 25 MG ONCE DAILY= 1/2 OF THE 50 MG TABLET ONCE DAILY If you need a refill on your cardiac medications before your next appointment, please call your pharmacy.   Lab work: Your physician recommends that you HAVE LAB WORK TODAY If you have labs (blood work) drawn today and your tests are completely normal, you will receive your results only by: Marland Kitchen MyChart Message (if you have MyChart) OR . A paper copy in the mail If you have any lab test that is abnormal or we need to change your treatment, we will call you to review the results.  Testing/Procedures: Your physician has recommended that you have a Cardioversion (DCCV). Electrical Cardioversion uses a jolt of electricity to your heart either through paddles or wired patches attached to your chest. This is a controlled, usually prescheduled, procedure. Defibrillation is done under light anesthesia in the hospital, and you usually go home the day of the procedure. This is done to get your heart back into a normal rhythm. You are not awake for the procedure. Please see the instruction sheet given to you today.    Follow-Up: At [facility], you and your health needs are our priority.  As part of our continuing mission to provide you with exceptional heart care, we have created designated Provider Care Teams.  These Care Teams include your primary Cardiologist (physician) and Advanced Practice Providers (APPs -  Physician Assistants and Nurse Practitioners) who all work together to provide you with the care you need, when you need it. You will need a follow up appointment in 3 months [place] are scheduled for a  Cardioversion on Wednesday 12-14-17 with Dr. Oval Linsey.  Please arrive at the [facility] (Main Entrance A) at [facility]: [address] [place], [place] [address] at [place]  (1 hour prior to procedure unless lab work is needed; if lab work is needed arrive 1.5 hours  ahead)  DIET: Nothing to eat or drink after midnight except a sip of water with medications (see medication instructions below)  Medication Instructions: Hold METOPROLOL Tuesday AND Wednesday=RESTART THURSDAY  Continue your anticoagulant: ELIQUIS You will need to continue your anticoagulant after your procedure until you  are told by your  Provider that it is safe to stop  You must have a responsible person to drive you home and stay in the waiting area during your procedure. Failure to do so could result in cancellation.  Bring your insurance cards.  *Special Note: Every effort is made to have your procedure done on time. Occasionally there are emergencies that occur at the hospital that may cause delays. Please be patient if a delay does occur.

## 2017-11-11 ENCOUNTER — Encounter: Payer: Self-pay | Admitting: *Deleted

## 2017-12-14 ENCOUNTER — Encounter (HOSPITAL_COMMUNITY)
Admission: RE | Disposition: A | Discharge status: Home / Self Care | Payer: Self-pay | Source: Ambulatory Visit | Attending: Cardiovascular Disease

## 2017-12-14 ENCOUNTER — Encounter (HOSPITAL_COMMUNITY): Payer: Self-pay | Admitting: *Deleted

## 2017-12-14 ENCOUNTER — Ambulatory Visit (HOSPITAL_COMMUNITY): Payer: 59 | Admitting: Certified Registered"

## 2017-12-14 ENCOUNTER — Ambulatory Visit (HOSPITAL_COMMUNITY)
Admission: RE | Admit: 2017-12-14 | Discharge: 2017-12-14 | Disposition: A | Discharge status: Home / Self Care | Payer: 59 | Source: Ambulatory Visit | Attending: Cardiovascular Disease | Admitting: Cardiovascular Disease

## 2017-12-14 DIAGNOSIS — I4891 Unspecified atrial fibrillation: Secondary | ICD-10-CM | POA: Insufficient documentation

## 2017-12-14 DIAGNOSIS — I509 Heart failure, unspecified: Secondary | ICD-10-CM | POA: Insufficient documentation

## 2017-12-14 DIAGNOSIS — I2510 Atherosclerotic heart disease of native coronary artery without angina pectoris: Secondary | ICD-10-CM | POA: Diagnosis not present

## 2017-12-14 DIAGNOSIS — I060 Rheumatic aortic stenosis: Secondary | ICD-10-CM | POA: Insufficient documentation

## 2017-12-14 DIAGNOSIS — I252 Old myocardial infarction: Secondary | ICD-10-CM | POA: Diagnosis not present

## 2017-12-14 DIAGNOSIS — E785 Hyperlipidemia, unspecified: Secondary | ICD-10-CM | POA: Diagnosis not present

## 2017-12-14 DIAGNOSIS — I484 Atypical atrial flutter: Secondary | ICD-10-CM | POA: Diagnosis not present

## 2017-12-14 DIAGNOSIS — Z79899 Other long term (current) drug therapy: Secondary | ICD-10-CM | POA: Diagnosis not present

## 2017-12-14 DIAGNOSIS — E669 Obesity, unspecified: Secondary | ICD-10-CM | POA: Insufficient documentation

## 2017-12-14 DIAGNOSIS — I110 Hypertensive heart disease with heart failure: Secondary | ICD-10-CM | POA: Diagnosis not present

## 2017-12-14 DIAGNOSIS — R011 Cardiac murmur, unspecified: Secondary | ICD-10-CM | POA: Insufficient documentation

## 2017-12-14 DIAGNOSIS — Z951 Presence of aortocoronary bypass graft: Secondary | ICD-10-CM | POA: Diagnosis not present

## 2017-12-14 DIAGNOSIS — Z6837 Body mass index (BMI) 37.0-37.9, adult: Secondary | ICD-10-CM | POA: Diagnosis not present

## 2017-12-14 DIAGNOSIS — Z7901 Long term (current) use of anticoagulants: Secondary | ICD-10-CM | POA: Diagnosis not present

## 2017-12-14 HISTORY — PX: CARDIOVERSION: SHX1299

## 2017-12-14 LAB — POCT I-STAT 4, (NA,K, GLUC, HGB,HCT)
Glucose, Bld: 112 mg/dL — ABNORMAL HIGH (ref 70–99)
HCT: 37 % — ABNORMAL LOW (ref 39.0–52.0)
Hemoglobin: 12.6 g/dL — ABNORMAL LOW (ref 13.0–17.0)
Potassium: 4 mmol/L (ref 3.5–5.1)
Sodium: 140 mmol/L (ref 135–145)

## 2017-12-14 SURGERY — CARDIOVERSION
Anesthesia: General

## 2017-12-14 MED ORDER — SODIUM CHLORIDE 0.9% FLUSH: 3.0000 mL | Freq: Two times a day (BID) | INTRAVENOUS | Status: DC

## 2017-12-14 MED ORDER — PROPOFOL 10 MG/ML IV BOLUS
INTRAVENOUS | Status: DC | PRN
  Administered 2017-12-14 (×2): 50 mg via INTRAVENOUS

## 2017-12-14 MED ORDER — SODIUM CHLORIDE 0.9% FLUSH: 3.0000 mL | INTRAVENOUS | Status: DC | PRN

## 2017-12-14 MED ORDER — SODIUM CHLORIDE 0.9 % IV SOLN
INTRAVENOUS | Status: DC | PRN
  Administered 2017-12-14: 13:00:00 via INTRAVENOUS

## 2017-12-14 MED ORDER — SODIUM CHLORIDE 0.9 % IV SOLN: 250.0000 mL | INTRAVENOUS | Status: DC

## 2017-12-14 NOTE — Anesthesia Preprocedure Evaluation (Addendum)
Anesthesia Evaluation  Patient identified by MRN, date of birth, ID band  Reviewed: Allergy & Precautions, NPO status , Patient's Chart, lab work & pertinent test results, reviewed documented beta blocker date and time   History of Anesthesia Complications Negative for: history of anesthetic complications  Airway Mallampati: III  TM Distance: >3 FB Neck ROM: Full    Dental no notable dental hx.    Pulmonary neg pulmonary ROS,    Pulmonary exam normal        Cardiovascular hypertension, Pt. on home beta blockers and Pt. on medications + CAD, + Past MI and +CHF  + dysrhythmias Atrial Fibrillation + Valvular Problems/Murmurs (mild/mod) AS  Rhythm:Irregular     Neuro/Psych PSYCHIATRIC DISORDERS Depression negative neurological ROS     GI/Hepatic negative GI ROS, Neg liver ROS,   Endo/Other  negative endocrine ROS  Renal/GU negative Renal ROS  negative genitourinary   Musculoskeletal negative musculoskeletal ROS (+)   Abdominal   Peds  Hematology negative hematology ROS (+)   Anesthesia Other Findings   Reproductive/Obstetrics                            Anesthesia Physical Anesthesia Plan  ASA: III  Anesthesia Plan: General   Post-op Pain Management:    Induction: Intravenous  PONV Risk Score and Plan: 2 and TIVA and Treatment may vary due to age or medical condition  Airway Management Planned: Mask  Additional Equipment: None  Intra-op Plan:   Post-operative Plan:   Informed Consent: I have reviewed the patients History and Physical, chart, labs and discussed the procedure including the risks, benefits and alternatives for the proposed anesthesia with the patient or authorized representative who has indicated his/her understanding and acceptance.     Plan Discussed with:   Anesthesia Plan Comments:         Anesthesia Quick Evaluation

## 2017-12-14 NOTE — Transfer of Care (Signed)
Immediate Anesthesia Transfer of Care Note  Patient: [name]  Procedure(s) Performed: CARDIOVERSION (N/A )  Patient Location: Endoscopy Unit  Anesthesia Type:General  Level of Consciousness: awake and patient cooperative  Airway & Oxygen Therapy: Patient Spontanous Breathing  Post-op Assessment: Report given to RN and Post -op Vital signs reviewed and stable  Post vital signs: Reviewed and stable  Last Vitals:  Vitals Value Taken Time  BP    Temp    Pulse    Resp    SpO2      Last Pain:  Vitals:   12/14/17 1220  TempSrc: Oral  PainSc: 0-No pain         Complications: No apparent anesthesia complications

## 2017-12-14 NOTE — Discharge Instructions (Signed)
Electrical Cardioversion, Care After °This sheet gives you information about how to care for yourself after your procedure. Your health care provider may also give you more specific instructions. If you have problems or questions, contact your health care provider. °What can I expect after the procedure? °After the procedure, it is common to have: °· Some redness on the skin where the shocks were given. ° °Follow these instructions at home: °· Do not drive for 24 hours if you were given a medicine to help you relax (sedative). °· Take over-the-counter and prescription medicines only as told by your health care provider. °· Ask your health care provider how to check your pulse. Check it often. °· Rest for 48 hours after the procedure or as told by your health care provider. °· Avoid or limit your caffeine use as told by your health care provider. °Contact a health care provider if: °· You feel like your heart is beating too quickly or your pulse is not regular. °· You have a serious muscle cramp that does not go away. °Get help right away if: °· You have discomfort in your chest. °· You are dizzy or you feel faint. °· You have trouble breathing or you are short of breath. °· Your speech is slurred. °· You have trouble moving an arm or leg on one side of your body. °· Your fingers or toes turn cold or blue. °This information is not intended to replace advice given to you by your health care provider. Make sure you discuss any questions you have with your health care provider. °Document Released: 11/08/2012 Document Revised: 08/22/2015 Document Reviewed: 07/25/2015 °Elsevier Interactive Patient Education © 2018 [employer] ° °

## 2017-12-14 NOTE — H&P (Addendum)
Per Dr. Jacalyn Lefevre note 11/10/17.  No changes noted.   HPI: FU CAD; history of coronary artery disease, status post coronary artery bypass graft surgery. This was performed in June 2006. Patient had a LIMA to the LAD, saphenous vein graft to the diagonal, saphenous vein graft to the acute marginal and saphenous vein graft to the PDA. Carotid Dopplers August 2015 showed no significant stenosis. Nuclear study August 2015 showed ejection fraction 55% but no ischemia or infarction. Possible mild decrease in systolic blood pressure at peak exercise.Abdominal ultrasound June 2017 showed no aneurysm. Last echocardiogram June 2019 showed ejection fraction 45 to 50%, hypokinesis of the basal to mid inferior wall, mild diastolic dysfunction, mild to moderate aortic stenosis with mean gradient 17 mmHg, mild mitral regurgitation and moderate left atrial enlargement.  Patient seen in August with newly diagnosed atrial fibrillation.  He was volume overloaded and was diuresed with Lasix.  Since last seen patient states his dyspnea has improved.  No orthopnea, PND, chest pain, palpitations, syncope or bleeding.  Minimal pedal edema.        Current Outpatient Medications  Medication Sig Dispense Refill  . apixaban (ELIQUIS) 5 MG TABS tablet Take 1 tablet (5 mg total) by mouth 2 (two) times daily. 60 tablet 1  . atorvastatin (LIPITOR) 80 MG tablet TAKE 1 TABLET BY MOUTH EVERY DAY 30 tablet 8  . furosemide (LASIX) 20 MG tablet Take 1 tablet (20 mg total) by mouth daily. 40mg  daily x3days w/k+ then down to 20mg  daily (Patient taking differently: Take 20 mg by mouth daily. ) 33 tablet 3  . metoprolol succinate (TOPROL-XL) 50 MG 24 hr tablet TAKE 1 TABLET BY MOUTH EVERY DAY 30 tablet 8  . polyethylene glycol powder (GLYCOLAX/MIRALAX) powder Take 17 g by mouth 2 (two) times daily as needed. 3350 g 3  . potassium chloride SA (KLOR-CON M20) 20 MEQ tablet Take 1 tablet (20 mEq total) by mouth daily. 40mg  daily  x3days w/lasix 40mg  then down to 20mg  daily (Patient taking differently: Take 20 mEq by mouth daily. ) 33 tablet 3  . tamsulosin (FLOMAX) 0.4 MG CAPS capsule TAKE ONE CAPSULE BY MOUTH EVERY DAY AFTER SUPPER 30 capsule 8   No current facility-administered medications for this visit.      Past Medical History:  Diagnosis Date  . Adjustment disorder with depressed mood 09/07/2007  . Colon polyps    Tubular Adenoma 2010  . CONGESTIVE HEART FAILURE 12/09/2008  . CORONARY ARTERY DISEASE 2006  . HYPERLIPIDEMIA 08/09/2006  . HYPERTENSION 08/09/2006  . MYOCARDIAL INFARCTION, HX OF 07/07/2004  . 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 OBESITY 07/19/2008         Past Surgical History:  Procedure Laterality Date  . COLONOSCOPY    . CORONARY ARTERY BYPASS GRAFT  2006    Social History        Socioeconomic History  . Marital status: Single    Spouse name: Not on file  . Number of children: Not on file  . Years of education: Not on file  . Highest education level: Not on file  Occupational History  . Not on file  Social Needs  . Financial resource strain: Not on file  . Food insecurity:    Worry: Not on file    Inability: Not on file  . Transportation needs:    Medical: Not on file    Non-medical: Not on file  Tobacco Use  . Smoking status: Never Smoker  . Smokeless  tobacco: Never Used  Substance and Sexual Activity  . Alcohol use: Yes    Alcohol/week: 1.0 standard drinks    Types: 1 Cans of beer per week    Comment: occasional  . Drug use: No  . Sexual activity: Not on file  Lifestyle  . Physical activity:    Days per week: Not on file    Minutes per session: Not on file  . Stress: Not on file  Relationships  . Social connections:    Talks on phone: Not on file    Gets together: Not on file    Attends religious service: Not on file    Active member of club or organization: Not on file    Attends meetings of clubs or organizations:  Not on file    Relationship status: Not on file  . Intimate partner violence:    Fear of current or ex partner: Not on file    Emotionally abused: Not on file    Physically abused: Not on file    Forced sexual activity: Not on file  Other Topics Concern  . Not on file  Social History Narrative  . Not on file         Family History  Problem Relation Age of Onset  . Hypertension Mother   . Hyperlipidemia Mother   . Heart disease Father   . Ulcers Father   . Bipolar disorder Brother   . Colon cancer Neg Hx     ROS: no fevers or chills, productive cough, hemoptysis, dysphasia, odynophagia, melena, hematochezia, dysuria, hematuria, rash, seizure activity, orthopnea, PND, claudication. Remaining systems are negative.  Physical Exam: Well-developed well-nourished in no acute distress.  Skin is warm and dry.  HEENT is normal.  Neck is supple.  Chest is clear to auscultation with normal expansion.  Cardiovascular exam is regular rate and rhythm.  2/6 systolic murmur left sternal border. Abdominal exam nontender or distended. No masses palpated. Extremities show trace edema. 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  His heart rate is mildly decreased.  He also has baseline conduction abnormalities.  Decrease Toprol to 25 mg daily.  Continue apixaban.  He states he has missed an occasional dose recently.  I have asked him to take this twice daily and we will arrange an elective cardioversion in 4 to 6 weeks.  Hold Toprol the day before and the day of cardioversion.  If he does not hold sinus rhythm then I would favor rate control and anticoagulation.  I have not referred for ablation as this appears to be atypical flutter.  2 coronary artery disease-no chest pain.  Continue medical therapy with  statin.  No aspirin given need for anticoagulation.  3 aortic stenosis-mild to moderate on most recent echocardiogram.  We will plan repeat study June 2020.  4 hypertension-blood pressure is controlled.  Continue present medications and follow.  5 hyperlipidemia-continue statin.  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  Euvolemic today.  Continue present dose of Lasix.  Check potassium and renal function.  Likely contribution of atrial fibrillation to congestive heart failure.  7 possible obstructive sleep apnea-management per Dr. Claiborne Billings.  [name] is a 67 y.o. male who has presented today for surgery, with the diagnosis of atrial fibrillation. The various methods of treatment have been discussed with  the patient and family. After consideration of risks, benefits and other options for treatment, the patient has consented to Procedure(s): TRANSESOPHAGEAL ECHOCARDIOGRAM (TEE) (N/A) as a surgical intervention . The patient's history has been reviewed, patient examined, no change in status, stable for surgery. I have reviewed the patient's chart and labs. Questions were answered to the patient's satisfaction.   Chandlar Staebell C. Oval Linsey, MD, [facility]  12/14/2017 12:51 PM

## 2017-12-14 NOTE — Anesthesia Postprocedure Evaluation (Signed)
Anesthesia Post Note  Patient: [name]  Procedure(s) Performed: CARDIOVERSION (N/A )     Patient location during evaluation: PACU Anesthesia Type: General Level of consciousness: awake and alert Pain management: pain level controlled Vital Signs Assessment: post-procedure vital signs reviewed and stable Respiratory status: spontaneous breathing, nonlabored ventilation and respiratory function stable Cardiovascular status: blood pressure returned to baseline and stable Postop Assessment: no apparent nausea or vomiting Anesthetic complications: no    Last Vitals:  Vitals:   12/14/17 1320 12/14/17 1330  BP: (!) 105/53 (!) 108/48  Pulse: 62 63  Resp: (!) 25 18  Temp:    SpO2: 96% 94%    Last Pain:  Vitals:   12/14/17 1330  TempSrc:   PainSc: 0-No pain                 Lidia Collum

## 2017-12-14 NOTE — Anesthesia Procedure Notes (Signed)
Procedure Name: General with mask airway Date/Time: 12/14/2017 1:07 PM Performed by: Orlie Dakin, CRNA Pre-anesthesia Checklist: Patient being monitored, Suction available, Emergency Drugs available and Patient identified Patient Re-evaluated:Patient Re-evaluated prior to induction Oxygen Delivery Method: Ambu bag Preoxygenation: Pre-oxygenation with 100% oxygen Induction Type: IV induction

## 2017-12-31 ENCOUNTER — Other Ambulatory Visit: Payer: Self-pay | Admitting: Adult Health

## 2018-01-02 ENCOUNTER — Other Ambulatory Visit: Payer: Self-pay | Admitting: Cardiology

## 2018-01-04 ENCOUNTER — Other Ambulatory Visit: Payer: Self-pay | Admitting: Internal Medicine

## 2018-01-04 ENCOUNTER — Telehealth: Payer: Self-pay | Admitting: *Deleted

## 2018-01-04 NOTE — Telephone Encounter (Signed)
Received a call today from Lequire @ [facility] notifying me that the patient's download report shows that he is not compliant with his CPAP therapy. It actually shows that he is not using it at all. She asks for me to contact the patient because he is not returning her calls. I called and spoke with the patient to see why he is not using his machine. He states that "it's just laziness on my part. I really don't have any reason." I explained to him that he has to be compliant by the time he has his appointment to see Dr Claiborne Billings or the [facility] will have to pick up his machine. Patient voiced understanding and says that he will start. to use the machine tonight.

## 2018-01-10 NOTE — CV Procedure (Signed)
Electrical Cardioversion Procedure Note [name] [id number] [birth year]  Procedure: Electrical Cardioversion Indications:  Atrial Flutter  Procedure Details Consent: Risks of procedure as well as the alternatives and risks of each were explained to the (patient/caregiver).  Consent for procedure obtained. Time Out: Verified patient identification, verified procedure, site/side was marked, verified correct patient position, special equipment/implants available, medications/allergies/relevent history reviewed, required imaging and test results available.  Performed  Patient placed on cardiac monitor, pulse oximetry, supplemental oxygen as necessary.  Sedation given: propofol Pacer pads placed anterior and posterior chest.  Cardioverted 1 time(s).  Cardioverted at 150J.  Evaluation Findings: Post procedure EKG shows: NSR Complications: None Patient did tolerate procedure well.   Tony Latch, MD 01/10/2018, 6:12 PM

## 2018-01-29 ENCOUNTER — Other Ambulatory Visit: Payer: Self-pay | Admitting: Internal Medicine

## 2018-02-02 NOTE — Progress Notes (Signed)
HPI: FU CAD; history of coronary artery disease, status post coronary artery bypass graft surgery. This was performed in June 2006. Patient had a LIMA to the LAD, saphenous vein graft to the diagonal, saphenous vein graft to the acute marginal and saphenous vein graft to the PDA. Carotid Dopplers August 2015 showed no significant stenosis. Nuclear study August 2015 showed ejection fraction 55% but no ischemia or infarction. Possible mild decrease in systolic blood pressure at peak exercise.Abdominal ultrasound June 2017 showed no aneurysm. Last echocardiogram June 2019 showed ejection fraction 45 to 50%, hypokinesis of the basal to mid inferior wall, mild diastolic dysfunction, mild to moderate aortic stenosis with mean gradient 17 mmHg, mild mitral regurgitation and moderate left atrial enlargement.  Patient seen in August with newly diagnosed atrial fibrillation.  He was volume overloaded and was diuresed with Lasix.  Had DCCV of atypical atrial flutter 11/19. Since last seen  patient denies dyspnea, chest pain or palpitations.  Pedal edema has improved compared to previous.  He has noted increased dizziness with standing.  Current Outpatient Medications  Medication Sig Dispense Refill  . acetaminophen (TYLENOL) 500 MG tablet Take 1,000 mg by mouth daily as needed for moderate pain or headache.    Marland Kitchen atorvastatin (LIPITOR) 80 MG tablet TAKE 1 TABLET BY MOUTH EVERY DAY (Patient taking differently: Take 80 mg by mouth daily. ) 30 tablet 8  . atorvastatin (LIPITOR) 80 MG tablet TAKE 1 TABLET BY MOUTH EVERY DAY 30 tablet 2  . ELIQUIS 5 MG TABS tablet TAKE 1 TABLET BY MOUTH TWICE A DAY 180 tablet 1  . furosemide (LASIX) 20 MG tablet Take 1 tablet (20 mg total) by mouth daily. 90 tablet 3  . metoprolol succinate (TOPROL-XL) 25 MG 24 hr tablet Take 1 tablet (25 mg total) by mouth daily. Take with or immediately following a meal. 90 tablet 3  . polyethylene glycol powder (GLYCOLAX/MIRALAX) powder  Take 17 g by mouth 2 (two) times daily as needed. (Patient taking differently: Take 17 g by mouth 2 (two) times daily as needed for mild constipation. ) 3350 g 3  . potassium chloride SA (KLOR-CON M20) 20 MEQ tablet Take 1 tablet (20 mEq total) by mouth daily. 90 tablet 3  . tamsulosin (FLOMAX) 0.4 MG CAPS capsule TAKE ONE CAPSULE BY MOUTH EVERY DAY AFTER SUPPER (Patient taking differently: Take 0.4 mg by mouth daily. ) 30 capsule 8   No current facility-administered medications for this visit.      Past Medical History:  Diagnosis Date  . Adjustment disorder with depressed mood 09/07/2007  . Colon polyps    Tubular Adenoma 2010  . CONGESTIVE HEART FAILURE 12/09/2008  . CORONARY ARTERY DISEASE 2006  . HYPERLIPIDEMIA 08/09/2006  . HYPERTENSION 08/09/2006  . MYOCARDIAL INFARCTION, HX OF 07/07/2004  . 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 OBESITY 07/19/2008    Past Surgical History:  Procedure Laterality Date  . CARDIOVERSION N/A 12/14/2017   Procedure: CARDIOVERSION;  Surgeon: [name], MD;  Location: [place];  Service: Cardiovascular;  Laterality: N/A;  . COLONOSCOPY    . CORONARY ARTERY BYPASS GRAFT  2006    Social History   Socioeconomic History  . Marital status: Single    Spouse name: Not on file  . Number of children: Not on file  . Years of education: Not on file  . Highest education level: Not on file  Occupational History  . Not on file  Social Needs  . Financial resource strain:  Not on file  . Food insecurity:    Worry: Not on file    Inability: Not on file  . Transportation needs:    Medical: Not on file    Non-medical: Not on file  Tobacco Use  . Smoking status: Never Smoker  . Smokeless tobacco: Never Used  Substance and Sexual Activity  . Alcohol use: Yes    Alcohol/week: 1.0 standard drinks    Types: 1 Cans of beer per week    Comment: occasional  . Drug use: No  . Sexual activity: Not on file  Lifestyle  . Physical activity:    Days per  week: Not on file    Minutes per session: Not on file  . Stress: Not on file  Relationships  . Social connections:    Talks on phone: Not on file    Gets together: Not on file    Attends religious service: Not on file    Active member of club or organization: Not on file    Attends meetings of clubs or organizations: Not on file    Relationship status: Not on file  . Intimate partner violence:    Fear of current or ex partner: Not on file    Emotionally abused: Not on file    Physically abused: Not on file    Forced sexual activity: Not on file  Other Topics Concern  . Not on file  Social History Narrative  . Not on file    Family History  Problem Relation Age of Onset  . Hypertension Mother   . Hyperlipidemia Mother   . Heart disease Father   . Ulcers Father   . Bipolar disorder Brother   . Colon cancer Neg Hx     ROS: no fevers or chills, productive cough, hemoptysis, dysphasia, odynophagia, melena, hematochezia, dysuria, hematuria, rash, seizure activity, orthopnea, PND, claudication. Remaining systems are negative.  Physical Exam: Well-developed well-nourished in no acute distress.  Skin is warm and dry.  HEENT is normal.  Neck is supple.  Chest is clear to auscultation with normal expansion.  Cardiovascular exam is regular rate and rhythm.  2/6 systolic murmur left sternal border. Abdominal exam nontender or distended. No masses palpated. Extremities show 1+ ankle edema. neuro grossly intact  ECG-sinus rhythm at a rate of 71.  First-degree AV block.  Right bundle branch block.  Left anterior fascicular block.  Cannot rule out septal infarct.  Personally reviewed  A/P  1 atrial flutter-patient is in sinus rhythm today status post cardioversion.  Continue Toprol and apixaban.  Patient states that he feels better in sinus rhythm with more energy.  Therefore if atrial arrhythmias recur we will need to consider antiarrhythmic therapy.  2 aortic stenosis-mild to  moderate on most recent echocardiogram.  We will plan repeat study June 2020.  3 coronary artery disease-patient denies chest pain.  Continue statin.  No aspirin given need for anticoagulation.  4 hypertension-patient's blood pressure is controlled.  Continue present medications and follow.  5 hyperlipidemia-continue statin.  6 possible obstructive sleep apnea-Per Dr. Claiborne Billings.  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  However he is describing dizziness with standing occasionally.  It sounds as though he may be orthostatic at home.  I will hold his Lasix and potassium for 3 days.  We will then resume each every other day.  Check potassium and renal function.  Kirk Ruths, MD

## 2018-02-13 ENCOUNTER — Ambulatory Visit: Payer: 59 | Admitting: Cardiology

## 2018-02-13 ENCOUNTER — Encounter: Payer: Self-pay | Admitting: Cardiology

## 2018-02-13 VITALS — BP 128/60 | HR 71 | Ht 66.0 in

## 2018-02-13 DIAGNOSIS — E7800 Pure hypercholesterolemia, unspecified: Secondary | ICD-10-CM

## 2018-02-13 DIAGNOSIS — I350 Nonrheumatic aortic (valve) stenosis: Secondary | ICD-10-CM

## 2018-02-13 DIAGNOSIS — I484 Atypical atrial flutter: Secondary | ICD-10-CM | POA: Diagnosis not present

## 2018-02-13 DIAGNOSIS — I10 Essential (primary) hypertension: Secondary | ICD-10-CM

## 2018-02-13 DIAGNOSIS — I5042 Chronic combined systolic (congestive) and diastolic (congestive) heart failure: Secondary | ICD-10-CM

## 2018-02-13 MED ORDER — FUROSEMIDE 20 MG PO TABS: 20.0000 mg | ORAL_TABLET | ORAL | 3 refills | Status: DC

## 2018-02-13 MED ORDER — POTASSIUM CHLORIDE CRYS ER 20 MEQ PO TBCR: 20.0000 meq | EXTENDED_RELEASE_TABLET | ORAL | 3 refills | Status: DC

## 2018-02-13 NOTE — Patient Instructions (Signed)
Medication Instructions:  DO NOT TAKE FUROSEMIDE OR POTASSIUM FOR THE NEXT 3 DAYS =AFTER 3 DAYS RESTART BOTH EVERY OTHER DAY If you need a refill on your cardiac medications before your next appointment, please call your pharmacy.   Lab work: Your physician recommends that you HAVE LAB WORK TODAY If you have labs (blood work) drawn today and your tests are completely normal, you will receive your results only by: Marland Kitchen MyChart Message (if you have MyChart) OR . A paper copy in the mail If you have any lab test that is abnormal or we need to change your treatment, we will call you to review the results.  Follow-Up: At [facility], you and your health needs are our priority.  As part of our continuing mission to provide you with exceptional heart care, we have created designated Provider Care Teams.  These Care Teams include your primary Cardiologist (physician) and Advanced Practice Providers (APPs -  Physician Assistants and Nurse Practitioners) who all work together to provide you with the care you need, when you need it.  Your physician recommends that you schedule a follow-up appointment in: [place] physician recommends that you schedule a follow-up appointment in: [place]

## 2018-02-14 LAB — BASIC METABOLIC PANEL
BUN/Creatinine Ratio: 14 (ref 10–24)
BUN: 11 mg/dL (ref 8–27)
CO2: 25 mmol/L (ref 20–29)
Calcium: 9.6 mg/dL (ref 8.6–10.2)
Chloride: 102 mmol/L (ref 96–106)
Creatinine, Ser: 0.76 mg/dL (ref 0.76–1.27)
GFR calc Af Amer: 108 mL/min/{1.73_m2} (ref >59)
GFR calc non Af Amer: 94 mL/min/{1.73_m2} (ref >59)
Glucose: 110 mg/dL — ABNORMAL HIGH (ref 65–99)
Potassium: 4.4 mmol/L (ref 3.5–5.2)
Sodium: 142 mmol/L (ref 134–144)

## 2018-03-12 NOTE — Progress Notes (Signed)
Cardiology Office Note   Date:  03/13/2018   ID:  Marlene, Pfluger [birth year], MRN [id number]  PCP:  Isaac Bliss, Rayford Halsted, MD  Cardiologist:  [facility]  Chief Complaint  Patient presents with  . Coronary Artery Disease  . Congestive Heart Failure  . Atrial Flutter    Atypical      History of Present Illness: [name] is a 68 y.o. male who presents for ongoing assessment and management of CAD, with CABG in  2006, with LIMA to LAD, SVG to diagonal, SVG to acute marginal and SVG to PDA. Repeat NM stress test in 2015 was negative for ischemia, atypical atrial flutter with DCCV 12/2017. Chronic combined CHF, with EF of 45%-50% in 07/2017.   On last office visit on 02/13/2018 with Dr.Crenshaw he remained in NSR. He was planned for repeat echo in June 2020.   He comes today without any complaints. He is keeping his fluid volume down and decreasing salt intake. He denies pain, DOE, or fatigue.   Past Medical History:  Diagnosis Date  . Adjustment disorder with depressed mood 09/07/2007  . Colon polyps    Tubular Adenoma 2010  . CONGESTIVE HEART FAILURE 12/09/2008  . CORONARY ARTERY DISEASE 2006  . HYPERLIPIDEMIA 08/09/2006  . HYPERTENSION 08/09/2006  . MYOCARDIAL INFARCTION, HX OF 07/07/2004  . 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 OBESITY 07/19/2008    Past Surgical History:  Procedure Laterality Date  . CARDIOVERSION N/A 12/14/2017   Procedure: CARDIOVERSION;  Surgeon: [name], MD;  Location: [place];  Service: Cardiovascular;  Laterality: N/A;  . COLONOSCOPY    . CORONARY ARTERY BYPASS GRAFT  2006     Current Outpatient Medications  Medication Sig Dispense Refill  . acetaminophen (TYLENOL) 500 MG tablet Take 1,000 mg by mouth daily as needed for moderate pain or headache.    Marland Kitchen atorvastatin (LIPITOR) 80 MG tablet TAKE 1 TABLET BY MOUTH EVERY DAY 30 tablet 2  . ELIQUIS 5 MG TABS tablet TAKE 1 TABLET BY MOUTH TWICE A DAY 180 tablet 1  . furosemide  (LASIX) 20 MG tablet Take 1 tablet (20 mg total) by mouth every other day. 90 tablet 3  . metoprolol succinate (TOPROL-XL) 25 MG 24 hr tablet Take 1 tablet (25 mg total) by mouth daily. Take with or immediately following a meal. 90 tablet 3  . polyethylene glycol powder (GLYCOLAX/MIRALAX) powder Take 17 g by mouth 2 (two) times daily as needed. (Patient taking differently: Take 17 g by mouth 2 (two) times daily as needed for mild constipation. ) 3350 g 3  . potassium chloride SA (KLOR-CON M20) 20 MEQ tablet Take 1 tablet (20 mEq total) by mouth every other day. 90 tablet 3  . tamsulosin (FLOMAX) 0.4 MG CAPS capsule TAKE ONE CAPSULE BY MOUTH EVERY DAY AFTER SUPPER (Patient taking differently: Take 0.4 mg by mouth daily. ) 30 capsule 8   No current facility-administered medications for this visit.     Allergies:   Patient has no known allergies.    Social History:  The patient  reports that he has never smoked. He has never used smokeless tobacco. He reports current alcohol use of about 1.0 standard drinks of alcohol per week. He reports that he does not use drugs.   Family History:  The patient's family history includes Bipolar disorder in his brother; Heart disease in his father; Hyperlipidemia in his mother; Hypertension in his mother; Ulcers in his father.    ROS: All other  systems are reviewed and negative. Unless otherwise mentioned in H&P    PHYSICAL EXAM: VS:  BP (!) 150/60   Pulse 71   Ht 5\' 6"  (1.676 m)   Wt 232 lb 6.4 oz (105.4 kg)   BMI 37.51 kg/m  , BMI Body mass index is 37.51 kg/m. GEN: Well nourished, well developed, in no acute distress, obese  HEENT: normal Neck: no JVD, carotid bruits, or masses Cardiac:  RRR; 2/6 holosystolic murmurs, rubs, or gallops,no edema  Respiratory:  Clear to auscultation bilaterally, normal work of breathing GI: soft, nontender, nondistended, + BS MS: no deformity or atrophy Skin: warm and dry, no rash Neuro:  Strength and sensation are  intact Psych: euthymic mood, full affect   EKG:  Not completed this office visit.   Recent Labs: 07/04/2017: ALT 30; TSH 2.70 09/05/2017: BNP 204.2; Platelets 91 12/14/2017: Hemoglobin 12.6 02/13/2018: BUN 11; Creatinine, Ser 0.76; Potassium 4.4; Sodium 142    Lipid Panel    Component Value Date/Time   CHOL 111 07/04/2017 1535   TRIG 121.0 07/04/2017 1535   HDL 28.70 (L) 07/04/2017 1535   CHOLHDL 4 07/04/2017 1535   VLDL 24.2 07/04/2017 1535   LDLCALC 58 07/04/2017 1535   LDLDIRECT 198.2 07/11/2009 0925      Wt Readings from Last 3 Encounters:  03/13/18 232 lb 6.4 oz (105.4 kg)  12/14/17 230 lb (104.3 kg)  11/10/17 235 lb 9.6 oz (106.9 kg)      Other studies Reviewed: - Left ventricle: The cavity size was mildly dilated. Wall   thickness was increased in a pattern of mild LVH. Systolic   function was mildly reduced. The estimated ejection fraction was   in the range of 45% to 50%. Basal to mid inferior hypokinesis to   akinesis. Doppler parameters are consistent with abnormal left   ventricular relaxation (grade 1 diastolic dysfunction). The E/e&'   ratio is >15, suggesting elevated LV filling pressure. - Aortic valve: Mildly calcified leaflets. Mild to moderate   stenosis. Trivial regurgitation. Mean gradient (S): 17 mm Hg.   Peak gradient (S): 33 mm Hg. Valve area (VTI): 1.7 cm^2. Valve   area (Vmax): 1.56 cm^2. Valve area (Vmean): 1.69 cm^2. - Mitral valve: Calcified annulus. Mildly thickened leaflets .   There was mild regurgitation. - Left atrium: Moderately dilated. - Tricuspid valve: There was trivial regurgitation. - Pulmonary arteries: PA peak pressure: 21 mm Hg (S) + RAP.  Impressions:  - Technically difficult study. LVEF 45-50%, mild LVH and mildly   dilated LV with basal to mid inferior hypokinesis, grade 1 DD,   elevated LV filling pressure, mild to moderate aortic stenosis -   mean gradient of 17 mmHg, mild MR, moderate LAE, trivial TR, RVSP   21  mmHg + RAP, IVC was not visualized.  ASSESSMENT AND PLAN:  1. AoV stenosis: He is due for repeat echo in June 2020. He has appointment in May with Dr.Crenshaw. I will have the echo completed just prior to that appointment so that it can be discussed.   2. Chronic Combined CHF: He has no evidence of volume overload. He is avoiding salt. He would like to be more active and I have encouraged him to do that. He likes to play golf and walk.   3. CAD:  He denies chest pain or DOE. He is medically complaint. He will continue his current regimen.   4. Atypical Atrial flutter: Remains on Eliquis and metoprolol. No changes.   Current medicines are  reviewed at length with the patient today.    Labs/ tests ordered today include: Echo  Phill Myron. [place] Pugh, ANP, AACC   03/13/2018 5:03 PM    [address] Office [phone] Fax [phone]

## 2018-03-13 ENCOUNTER — Ambulatory Visit: Payer: 59 | Admitting: Adult Health

## 2018-03-13 ENCOUNTER — Ambulatory Visit: Payer: 59 | Admitting: Cardiology

## 2018-03-13 ENCOUNTER — Encounter: Payer: Self-pay | Admitting: Adult Health

## 2018-03-13 VITALS — BP 150/60 | HR 71 | Ht 66.0 in | Wt 232.4 lb

## 2018-03-13 DIAGNOSIS — I10 Essential (primary) hypertension: Secondary | ICD-10-CM

## 2018-03-13 DIAGNOSIS — I5042 Chronic combined systolic (congestive) and diastolic (congestive) heart failure: Secondary | ICD-10-CM | POA: Diagnosis not present

## 2018-03-13 DIAGNOSIS — I2510 Atherosclerotic heart disease of native coronary artery without angina pectoris: Secondary | ICD-10-CM

## 2018-03-13 DIAGNOSIS — I350 Nonrheumatic aortic (valve) stenosis: Secondary | ICD-10-CM | POA: Diagnosis not present

## 2018-03-13 NOTE — Patient Instructions (Addendum)
Follow-Up: You will need a follow up appointment keep scheduled appointment with   Kirk Ruths, MD or one of the following Advanced Practice Providers on your designated Care Team:  Kerin Ransom, PA-C Roby Lofts, PA-C Sande Rives, [place]   Testing: Echocardiogram - Your physician has requested that you have an echocardiogram. Echocardiography is a painless test that uses sound waves to create images of your heart. It provides your doctor with information about the size and shape of your heart and how well your heart's chambers and valves are working. This procedure takes approximately one hour. There are no restrictions for this procedure. This will be performed at our [facility] location - [address].  Medication Instructions:  NO CHANGES- Your physician recommends that you continue on your current medications as directed. Please refer to the Current Medication list given to you today. If you need a refill on your cardiac medications before your next appointment, please call your pharmacy. Labwork: When you have labs (blood work) and your tests are completely normal, you will receive your results ONLY by [place] (if you have MyChart) -OR- A paper copy in the mail.  At [facility], you and your health needs are our priority.  As part of our continuing mission to provide you with exceptional heart care, we have created designated Provider Care Teams.  These Care Teams include your primary Cardiologist (physician) and Advanced Practice Providers (APPs -  Physician Assistants and Nurse Practitioners) who all work together to provide you with the care you need, when you need it.  Thank you for choosing [facility] at [facility]!!

## 2018-03-16 ENCOUNTER — Ambulatory Visit: Payer: 59 | Admitting: Cardiovascular Disease

## 2018-03-16 ENCOUNTER — Encounter: Payer: Self-pay | Admitting: Cardiovascular Disease

## 2018-03-16 VITALS — BP 138/68 | HR 72 | Ht 66.0 in | Wt 237.0 lb

## 2018-03-16 DIAGNOSIS — G4733 Obstructive sleep apnea (adult) (pediatric): Secondary | ICD-10-CM | POA: Diagnosis not present

## 2018-03-16 DIAGNOSIS — E785 Hyperlipidemia, unspecified: Secondary | ICD-10-CM

## 2018-03-16 DIAGNOSIS — I10 Essential (primary) hypertension: Secondary | ICD-10-CM

## 2018-03-16 DIAGNOSIS — I484 Atypical atrial flutter: Secondary | ICD-10-CM

## 2018-03-16 DIAGNOSIS — M25473 Effusion, unspecified ankle: Secondary | ICD-10-CM

## 2018-03-16 DIAGNOSIS — Z951 Presence of aortocoronary bypass graft: Secondary | ICD-10-CM | POA: Diagnosis not present

## 2018-03-16 DIAGNOSIS — I2510 Atherosclerotic heart disease of native coronary artery without angina pectoris: Secondary | ICD-10-CM | POA: Diagnosis not present

## 2018-03-16 NOTE — Patient Instructions (Addendum)
Medication Instructions:  The current medical regimen is effective;  continue present plan and medications.  If you need a refill on your cardiac medications before your next appointment, please call your pharmacy.    Follow-Up: At [facility], you and your health needs are our priority.  As part of our continuing mission to provide you with exceptional heart care, we have created designated Provider Care Teams.  These Care Teams include your primary Cardiologist (physician) and Advanced Practice Providers (APPs -  Physician Assistants and Nurse Practitioners) who all work together to provide you with the care you need, when you need it. . You will need a follow up appointment in 12 months (sleep).  Please call our office 2 months in advance to schedule this appointment.  You may see Dr.Kelly (Sleep)

## 2018-03-16 NOTE — Progress Notes (Signed)
Cardiology Office Note    Date:  03/18/2018   ID:  [name], [name] Sep 06, 1950, MRN [id number]  PCP:  Isaac Bliss, Rayford Halsted, MD  Cardiologist:  Shelva Majestic, MD (sleep): Dr. Stanford Breed  New sleep evaluation  History of Present Illness:  [name] is a 68 y.o. male by Dr. Stanford Breed for his primary cardiology care.  He presents for initial sleep clinic evaluation following initiation of CPAP therapy and diagnosed with obstructive sleep apnea.  [name] has a history of known CAD and underwent CABG revascularization surgery 2006 with a LIMA to his LAD, SVG to the diagonal, SVG to acute marginal and SVG to the PDA.  June 2019 echo Doppler study showed EF 45 to 50% with hypokinesis of the basal to mid inferior wall, mild diastolic dysfunction, mild to moderate aortic stenosis with a mean gradient of 17, mild mitral regurgitation and moderate left atrial enlargement.  In August 2019 he developed atrial fibrillation in the setting of volume overload.  He subsequent underwent DC cardioversion in November 2019.  Due to concerns for obstructive sleep apnea he was referred for a split-night CPAP study.  On the diagnostic portion of the study he was found to have severe sleep apnea with an AHI of 35.0, RDI 38.1 and events were more severe during REM sleep with an AHI of 41.9.  Oxygen nadir was 84%.  CPAP was initiated and was titrated up to 10 cm water pressure but REM sleep was not achieved at this pressure.  His CPAP set up date was November 22, 2017.  [facility] is his DME company.  He has been using a F&P simplex fullface mask, medium size.  A download was obtained in the office today from January 13 through March 14, 2018.  He is meeting Medicare compliance with 28 of 30 days of usage.  He is averaging 4 hours and 52 minutes of CPAP use and 83% of the days had CPAP use over 4 hours.  He has a ResMed air sense 10 AutoSet unit with a minimum pressure of 9 and maximum pressure  set at 16.  AHI 0.7.  There are periods of occasional leak.  He typically goes to bed at 11 PM but wakes up at 6 AM.  He notes improved energy since initiating CPAP.  He presents for his initial sleep evaluation.  Past Medical History:  Diagnosis Date  . Adjustment disorder with depressed mood 09/07/2007  . Colon polyps    Tubular Adenoma 2010  . CONGESTIVE HEART FAILURE 12/09/2008  . CORONARY ARTERY DISEASE 2006  . HYPERLIPIDEMIA 08/09/2006  . HYPERTENSION 08/09/2006  . MYOCARDIAL INFARCTION, HX OF 07/07/2004  . 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 OBESITY 07/19/2008    Past Surgical History:  Procedure Laterality Date  . CARDIOVERSION N/A 12/14/2017   Procedure: CARDIOVERSION;  Surgeon: [name], MD;  Location: [facility];  Service: Cardiovascular;  Laterality: N/A;  . COLONOSCOPY    . CORONARY ARTERY BYPASS GRAFT  2006    Current Medications: Outpatient Medications Prior to Visit  Medication Sig Dispense Refill  . acetaminophen (TYLENOL) 500 MG tablet Take 1,000 mg by mouth daily as needed for moderate pain or headache.    Marland Kitchen atorvastatin (LIPITOR) 80 MG tablet TAKE 1 TABLET BY MOUTH EVERY DAY 30 tablet 2  . ELIQUIS 5 MG TABS tablet TAKE 1 TABLET BY MOUTH TWICE A DAY 180 tablet 1  . furosemide (LASIX) 20 MG tablet Take 1 tablet (20 mg  total) by mouth every other day. 90 tablet 3  . metoprolol succinate (TOPROL-XL) 25 MG 24 hr tablet Take 1 tablet (25 mg total) by mouth daily. Take with or immediately following a meal. 90 tablet 3  . polyethylene glycol powder (GLYCOLAX/MIRALAX) powder Take 17 g by mouth 2 (two) times daily as needed. (Patient taking differently: Take 17 g by mouth 2 (two) times daily as needed for mild constipation. ) 3350 g 3  . potassium chloride SA (KLOR-CON M20) 20 MEQ tablet Take 1 tablet (20 mEq total) by mouth every other day. 90 tablet 3  . tamsulosin (FLOMAX) 0.4 MG CAPS capsule TAKE ONE CAPSULE BY MOUTH EVERY DAY AFTER SUPPER (Patient taking  differently: Take 0.4 mg by mouth daily. ) 30 capsule 8   No facility-administered medications prior to visit.      Allergies:   Patient has no known allergies.   Social History   Socioeconomic History  . Marital status: Single    Spouse name: Not on file  . Number of children: Not on file  . Years of education: Not on file  . Highest education level: Not on file  Occupational History  . Not on file  Social Needs  . Financial resource strain: Not on file  . Food insecurity:    Worry: Not on file    Inability: Not on file  . Transportation needs:    Medical: Not on file    Non-medical: Not on file  Tobacco Use  . Smoking status: Never Smoker  . Smokeless tobacco: Never Used  Substance and Sexual Activity  . Alcohol use: Yes    Alcohol/week: 1.0 standard drinks    Types: 1 Cans of beer per week    Comment: occasional  . Drug use: No  . Sexual activity: Not on file  Lifestyle  . Physical activity:    Days per week: Not on file    Minutes per session: Not on file  . Stress: Not on file  Relationships  . Social connections:    Talks on phone: Not on file    Gets together: Not on file    Attends religious service: Not on file    Active member of club or organization: Not on file    Attends meetings of clubs or organizations: Not on file    Relationship status: Not on file  Other Topics Concern  . Not on file  Social History Narrative  . Not on file     Social history is notable in that he was born in [place].  He is single.  However he has been living with the same woman for the last 35 years.  They do not have children.  He works for [employer] as a Associate Professor.  Family History:  The patient's family history includes Bipolar disorder in his brother; Heart disease in his father; Hyperlipidemia in his mother; Hypertension in his mother; Ulcers in his father.   ROS General: Negative; No fevers, chills, or night sweats;  HEENT: Negative; No  changes in vision or hearing, sinus congestion, difficulty swallowing Pulmonary: Negative; No cough, wheezing, shortness of breath, hemoptysis Cardiovascular: History of CAD, status post CABG.  History of PAF/a flutter Intermittent ankle swelling GI: Negative; No nausea, vomiting, diarrhea, or abdominal pain GU: Negative; No dysuria, hematuria, or difficulty voiding Musculoskeletal: Negative; no myalgias, joint pain, or weakness Hematologic/Oncology: Negative; no easy bruising, bleeding Endocrine: Negative; no heat/cold intolerance; no diabetes Neuro: Negative; no changes in balance, headaches  Skin: Negative; No rashes or skin lesions Psychiatric: Negative; No behavioral problems, depression Sleep: Previous history of snoring, nonrestorative sleep.  Feels better with CPAP.  No residual snoring.  No daytime sleepiness, bruxism, restless legs, hypnogognic hallucinations, no cataplexy   An Epworth sleepiness scale was calculated in the office today and this endorsed as 5 as shown below:  Epworth Sleepiness Scale: Situation   Chance of Dozing/Sleeping (0 = never , 1 = slight chance , 2 = moderate chance , 3 = high chance )   sitting and reading 1   watching TV 1   sitting inactive in a public place 0   being a passenger in a motor vehicle for an hour or more 0   lying down in the afternoon 2   sitting and talking to someone 0   sitting quietly after lunch (no alcohol) 1   while stopped for a few minutes in traffic as the driver 0   Total Score  5    Other comprehensive 14 point system review is negative.   PHYSICAL EXAM:   VS:  BP 138/68   Pulse 72   Ht '5\' 6"'  (1.676 m)   Wt 237 lb (107.5 kg)   BMI 38.25 kg/m     Repeat blood pressure by me was 132/70  Wt Readings from Last 3 Encounters:  03/16/18 237 lb (107.5 kg)  03/13/18 232 lb 6.4 oz (105.4 kg)  12/14/17 230 lb (104.3 kg)    General: Alert, oriented, no distress.  Skin: normal turgor, no rashes, warm and dry HEENT:  Normocephalic, atraumatic. Pupils equal round and reactive to light; sclera anicteric; extraocular muscles intact; Fundi ** Nose without nasal septal hypertrophy Mouth/Parynx benign; Mallinpatti scale 3 Neck: No JVD, no carotid bruits; normal carotid upstroke Lungs: clear to ausculatation and percussion; no wheezing or rales Chest wall: without tenderness to palpitation Heart: PMI not displaced, RRR, s1 s2 normal, 1/6 systolic murmur, no diastolic murmur, no rubs, gallops, thrills, or heaves Abdomen: soft, nontender; no hepatosplenomehaly, BS+; abdominal aorta nontender and not dilated by palpation. Back: no CVA tenderness Pulses 2+ Musculoskeletal: full range of motion, normal strength, no joint deformities Extremities: 1+ ankle edema bilaterally;  no clubbing cyanosis , Homan's sign negative  Neurologic: grossly nonfocal; Cranial nerves grossly wnl Psychologic: Normal mood and affect   Studies/Labs Reviewed:   EKG:  EKG is ordered today.  ECG (independently read by me): Sinus rhythm at 72 bpm.  First-degree AV block.  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  Recent Labs: BMP Latest Ref Rng & Units 02/13/2018 12/14/2017 11/10/2017  Glucose 65 - 99 mg/dL 110(H) 112(H) 118(H)  BUN 8 - 27 mg/dL 11 - 13  Creatinine 0.76 - 1.27 mg/dL 0.76 - 0.78  BUN/Creat Ratio 10 - 24 14 - 17  Sodium 134 - 144 mmol/L 142 140 137  Potassium 3.5 - 5.2 mmol/L 4.4 4.0 4.9  Chloride 96 - 106 mmol/L 102 - 99  CO2 20 - 29 mmol/L 25 - 23  Calcium 8.6 - 10.2 mg/dL 9.6 - 9.4     Hepatic Function Latest Ref Rng & Units 07/04/2017 08/01/2015 07/11/2015  Total Protein 6.0 - 8.3 g/dL 6.6 6.7 6.7  Albumin 3.5 - 5.2 g/dL 3.6 3.7 3.7  AST 0 - 37 U/L 38(H) 29 42(H)  ALT 0 - 53 U/L '30 22 26  ' Alk Phosphatase 39 - 117 U/L 76 64 59  Total Bilirubin 0.2 - 1.2 mg/dL 1.1 1.0 1.0  Bilirubin, Direct 0.0 - 0.3 mg/dL - 0.2 0.3    CBC Latest Ref Rng & Units 12/14/2017 09/05/2017 07/04/2017    WBC 3.4 - 10.8 x10E3/uL - 4.4 7.1  Hemoglobin 13.0 - 17.0 g/dL 12.6(L) 11.9(L) 12.4(L)  Hematocrit 39.0 - 52.0 % 37.0(L) 35.7(L) 36.2(L)  Platelets 150 - 450 x10E3/uL - 91(LL) 125.0(L)   Lab Results  Component Value Date   MCV 92 09/05/2017   MCV 91.4 07/04/2017   MCV 91.2 08/01/2015   Lab Results  Component Value Date   TSH 2.70 07/04/2017   Lab Results  Component Value Date   HGBA1C 6.4 08/10/2017     BNP    Component Value Date/Time   BNP 204.2 (H) 09/05/2017 1508    ProBNP    Component Value Date/Time   PROBNP 36.0 12/03/2008 1631     Lipid Panel     Component Value Date/Time   CHOL 111 07/04/2017 1535   TRIG 121.0 07/04/2017 1535   HDL 28.70 (L) 07/04/2017 1535   CHOLHDL 4 07/04/2017 1535   VLDL 24.2 07/04/2017 1535   LDLCALC 58 07/04/2017 1535   LDLDIRECT 198.2 07/11/2009 0925     RADIOLOGY: No results found.   Additional studies/ records that were reviewed today include:  I reviewed the records of Dr. Stanford Breed, Hershal Coria, split-night CPAP titration trial, laboratory, and obtained a download from January 13 through March 14, 2018.  ASSESSMENT:    1. OSA (obstructive sleep apnea)   2. Essential hypertension   3. Coronary artery disease involving native coronary artery of native heart without angina pectoris   4. Hx of CABG   5. Atypical atrial flutter (HCC)   6. Ankle edema   7. Hyperlipidemia with target LDL less than 70      PLAN:  Mr. Kito Cuffe is a 68 year old gentleman who has a significant cardiac history and is status post CAD with CABG revascularization surgery in 2006, a history of mild chronic combined CHF with EF of 45 to 50% who had recently developed atrial fibrillation and subsequent atrial flutter requiring DC cardioversion.  Historically he admitted to snoring, nonrestorative sleep, and due to his cardiovascular comorbidities and symptoms a sleep study was performed which revealed severe sleep apnea with an  AHI of 35.0, with events being more severe during REM sleep with an AHI of 41.9.  He is meeting compliance since CPAP initiation.  However, I had a long discussion with him today in the office.  He is not sleeping for adequate duration and is only using CPAP for 4 hours and 52 minutes per night.  I discussed with him normal sleep architecture and the adverse consequences of sleep apnea on normal sleep architecture.  We discussed optimal sleep duration at 8 hours.  We discussed that the preponderance of rem sleep occurs in the second half of the night and if he does not have his mask on during the latter portion of the night when his sleep apnea is most severe he would not be deriving benefit.  We discussed the association between nocturnal arrhythmias, PAF, as well as hypoxia mediated ischemia particularly during REM sleep if he is not using therapy.  We also discussed its effect on nocturia.  He admits to previously having very poor sleeping habits.  After much discussion he was very appreciative as to the reasons why therapy is indicated and optimal treatment is used throughout the entire night duration.  We discussed different mask options but he seems satisfied with his  current mask but if this becomes problematic in the future newer technology is available.  On his download, AHI is excellent at 0.7 with his 95th percentile pressure 13.1 and maximum average pressure at 14.4.  He will continue with current therapy.  He continues to be on Eliquis for anticoagulation.  There is mild ankle edema today.  He has been taking furosemide 20 mg every other day but on days when her edema persists he this frequency may need to be increased.  He is maintaining sinus rhythm on metoprolol XL.  He continues to be on atorvastatin for hyperlipidemia.  I will see him in 1 year for sleep reevaluation or sooner problems arise.   Medication Adjustments/Labs and Tests Ordered: Current medicines are reviewed at length with the  patient today.  Concerns regarding medicines are outlined above.  Medication changes, Labs and Tests ordered today are listed in the Patient Instructions below. Patient Instructions  Medication Instructions:  The current medical regimen is effective;  continue present plan and medications.  If you need a refill on your cardiac medications before your next appointment, please call your pharmacy.    Follow-Up: At [facility], you and your health needs are our priority.  As part of our continuing mission to provide you with exceptional heart care, we have created designated Provider Care Teams.  These Care Teams include your primary Cardiologist (physician) and Advanced Practice Providers (APPs -  Physician Assistants and Nurse Practitioners) who all work together to provide you with the care you need, when you need it. . You will need a follow up appointment in 12 months (sleep).  Please call our office 2 months in advance to schedule this appointment.  You may see Dr.Imre Vecchione (Sleep)        Signed, Shelva Majestic, MD  03/18/2018 3:05 PM    [facility] [address], [place], [place], [place]  [address] Phone: [phone]

## 2018-03-18 ENCOUNTER — Encounter: Payer: Self-pay | Admitting: Cardiovascular Disease

## 2018-04-11 ENCOUNTER — Encounter: Payer: Self-pay | Admitting: Internal Medicine

## 2018-04-11 ENCOUNTER — Ambulatory Visit: Payer: 59 | Admitting: Internal Medicine

## 2018-04-11 VITALS — BP 110/64 | HR 66 | Temp 98.4°F | Ht 66.0 in | Wt 230.8 lb

## 2018-04-11 DIAGNOSIS — I5042 Chronic combined systolic (congestive) and diastolic (congestive) heart failure: Secondary | ICD-10-CM | POA: Diagnosis not present

## 2018-04-11 DIAGNOSIS — R011 Cardiac murmur, unspecified: Secondary | ICD-10-CM

## 2018-04-11 DIAGNOSIS — I252 Old myocardial infarction: Secondary | ICD-10-CM | POA: Diagnosis not present

## 2018-04-11 DIAGNOSIS — I350 Nonrheumatic aortic (valve) stenosis: Secondary | ICD-10-CM

## 2018-04-11 DIAGNOSIS — I10 Essential (primary) hypertension: Secondary | ICD-10-CM

## 2018-04-11 DIAGNOSIS — F4321 Adjustment disorder with depressed mood: Secondary | ICD-10-CM

## 2018-04-11 DIAGNOSIS — D696 Thrombocytopenia, unspecified: Secondary | ICD-10-CM

## 2018-04-11 DIAGNOSIS — E785 Hyperlipidemia, unspecified: Secondary | ICD-10-CM

## 2018-04-11 DIAGNOSIS — I484 Atypical atrial flutter: Secondary | ICD-10-CM | POA: Diagnosis not present

## 2018-04-11 DIAGNOSIS — I2510 Atherosclerotic heart disease of native coronary artery without angina pectoris: Secondary | ICD-10-CM

## 2018-04-11 NOTE — Patient Instructions (Addendum)
Great meeting you today.  Instructions: -Schedule an annual physical exam for July, come fasting, so we can draw blood work.

## 2018-04-11 NOTE — Progress Notes (Signed)
Established Patient Office Visit     CC/Reason for Visit: transfer of care, follow up on chronic medical conditions  HPI: [name] is a 68 y.o. male who is coming in today for the above mentioned reasons. Past Medical History is significant for hypertension, hyperlipidemia, hx of CABG (2006), CAD, CHF, OSA, depression, obesity, hx of MI, atrial flutter, thrombocytopenia and heart murmur.  Patient was cardioverted at [facility] in November of last year, and recently followed up with cardio, Dr. Lenna Gilford, last month. His last echo last month EF was 45-50%. He says he has another echo scheduled in May to monitor his heart valve (aortic stenosis).  He works for [employer]. 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  He is eating a heart healthy diet, eating more veggies and has lost about 30 pounds.  He says he has some BLE, but it has decreased recently.  He has OSA and sleeps with a CPAP every night.  He gets up at night to urinate just once a night, and takes flomax for his prostate.  He has no acute complaints today.   Past Medical/Surgical History: Past Medical History:  Diagnosis Date  . Adjustment disorder with depressed mood 09/07/2007  . Colon polyps    Tubular Adenoma 2010  . CONGESTIVE HEART FAILURE 12/09/2008  . CORONARY ARTERY DISEASE 2006  . HYPERLIPIDEMIA 08/09/2006  . HYPERTENSION 08/09/2006  . MYOCARDIAL INFARCTION, HX OF 07/07/2004  . 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 OBESITY 07/19/2008    Past Surgical History:  Procedure Laterality Date  . CARDIOVERSION N/A 12/14/2017   Procedure: CARDIOVERSION;  Surgeon: [name], MD;  Location: [facility];  Service: Cardiovascular;  Laterality: N/A;  . COLONOSCOPY    . CORONARY ARTERY BYPASS GRAFT  2006    Social History:  reports that he has never smoked. He has never used smokeless tobacco. He reports current alcohol use of about 1.0 standard drinks of alcohol per week. He  reports that he does not use drugs.  Allergies: No Known Allergies  Family History:  Family History  Problem Relation Age of Onset  . Hypertension Mother   . Hyperlipidemia Mother   . Heart disease Father   . Ulcers Father   . Bipolar disorder Brother   . Colon cancer Neg Hx      Current Outpatient Medications:  .  acetaminophen (TYLENOL) 500 MG tablet, Take 1,000 mg by mouth daily as needed for moderate pain or headache., Disp: , Rfl:  .  atorvastatin (LIPITOR) 80 MG tablet, TAKE 1 TABLET BY MOUTH EVERY DAY, Disp: 30 tablet, Rfl: 2 .  ELIQUIS 5 MG TABS tablet, TAKE 1 TABLET BY MOUTH TWICE A DAY, Disp: 180 tablet, Rfl: 1 .  furosemide (LASIX) 20 MG tablet, Take 1 tablet (20 mg total) by mouth every other day., Disp: 90 tablet, Rfl: 3 .  metoprolol succinate (TOPROL-XL) 25 MG 24 hr tablet, Take 1 tablet (25 mg total) by mouth daily. Take with or immediately following a meal., Disp: 90 tablet, Rfl: 3 .  polyethylene glycol powder (GLYCOLAX/MIRALAX) powder, Take 17 g by mouth 2 (two) times daily as needed. (Patient taking differently: Take 17 g by mouth 2 (two) times daily as needed for mild constipation. ), Disp: 3350 g, Rfl: 3 .  potassium chloride SA (KLOR-CON M20) 20 MEQ tablet, Take 1 tablet (20 mEq total) by mouth every other day., Disp: 90 tablet, Rfl: 3 .  tamsulosin (FLOMAX) 0.4  MG CAPS capsule, TAKE ONE CAPSULE BY MOUTH EVERY DAY AFTER SUPPER (Patient taking differently: Take 0.4 mg by mouth daily. ), Disp: 30 capsule, Rfl: 8  Review of Systems:  Constitutional: Denies fever, chills, diaphoresis, appetite change and fatigue.  HEENT: Denies photophobia, eye pain, redness, hearing loss, ear pain, congestion, sore throat, rhinorrhea, sneezing, mouth sores, trouble swallowing, neck pain, neck stiffness and tinnitus.   Respiratory: Denies SOB, DOE, cough, chest tightness,  and wheezing.   Cardiovascular: Denies chest pain, palpitations.  C/o BLE edema Gastrointestinal: Denies  nausea, vomiting, abdominal pain, diarrhea, constipation, blood in stool and abdominal distention.  Genitourinary: Denies dysuria, urgency, frequency, hematuria, flank pain and difficulty urinating.  Endocrine: Denies: hot or cold intolerance, sweats, changes in hair or nails, polyuria, polydipsia. Musculoskeletal: Denies myalgias, back pain, joint swelling, arthralgias and gait problem.  Skin: Denies pallor, rash and wound.  Neurological: Denies dizziness, seizures, syncope, weakness, light-headedness, numbness and headaches.  Hematological: Denies adenopathy. Easy bruising, personal or family bleeding history  Psychiatric/Behavioral: Denies suicidal ideation, mood changes, confusion, nervousness, sleep disturbance and agitation   Physical Exam: Vitals:   04/11/18 1410  BP: 110/64  Pulse: 66  Temp: 98.4 F (36.9 C)  TempSrc: Oral  SpO2: 95%  Weight: 230 lb 12.8 oz (104.7 kg)  Height: 5\' 6"  (1.676 m)    Body mass index is 37.25 kg/m.   Constitutional: NAD, calm, comfortable Eyes: PERRL, lids and conjunctivae normal Respiratory: clear to auscultation bilaterally, no wheezing, no crackles. Normal respiratory effort. No accessory muscle use.  Cardiovascular: Regular rate and rhythm, no murmurs / rubs / gallops. 2+ pedal pulses.  Moderate BLE edema Abd: S/NT/ND/+BS, no masses or organomegaly on exam Psychiatric: Normal judgment and insight. Alert and oriented x 3. Normal mood.    Impression and Plan:  Essential hypertension -well controlled -cont taking meds as directed -DASH diet  MYOCARDIAL INFARCTION, HX OF -stable  Chronic combined systolic and diastolic heart failure ([place]) -ECHO 6/19: EF 45-50%, grade 1 DD, inf akinesis, moderate AoV stenosis -stable -follow up with cardiology as scheduled -on BB, lasix, statin. -Consider ACE-I/ARB/ARNI as he has no contraindications at next cardio visit.  Atypical atrial flutter (HCC) -cardioverted in November -rate controlled  on metoprolol, anticoagulated on Eliquis  Dyslipidemia -Last LDL was 58 in 6/19. -heart healthy diet  Obesity due to excess calories with serious comorbidity, unspecified classification -discussed weight loss plan, lifestyle modifications  Adjustment disorder with depressed mood -mood stable, not on meds  Moderate AoV Stenosis -For follow up ECHO in May by cards.     Patient Instructions  Great meeting you today.  Instructions: -Schedule an annual physical exam for July, come fasting, so we can draw blood work.      Enzo Bi, RN DNP Student  Primary Care at [facility]

## 2018-05-12 ENCOUNTER — Other Ambulatory Visit: Payer: Self-pay | Admitting: Internal Medicine

## 2018-06-09 ENCOUNTER — Other Ambulatory Visit: Payer: Self-pay

## 2018-06-09 ENCOUNTER — Ambulatory Visit (HOSPITAL_COMMUNITY): Payer: 59 | Attending: Internal Medicine

## 2018-06-09 DIAGNOSIS — I350 Nonrheumatic aortic (valve) stenosis: Secondary | ICD-10-CM

## 2018-06-15 NOTE — Progress Notes (Signed)
Virtual Visit via Video Note changed to phone as no smart phone   This visit type was conducted due to national recommendations for restrictions regarding the COVID-19 Pandemic (e.g. social distancing) in an effort to limit this patient's exposure and mitigate transmission in our community.  Due to his co-morbid illnesses, this patient is at least at moderate risk for complications without adequate follow up.  This format is felt to be most appropriate for this patient at this time.  All issues noted in this document were discussed and addressed.  A limited physical exam was performed with this format.  Please refer to the patient's chart for his consent to telehealth for [name].   Date:  06/16/2018   ID:  [name], DOB Jan 10, 1951, MRN [id number]  Patient Location: Home Provider Location: Home  PCP:  Isaac Bliss, Rayford Halsted, MD  Cardiologist:  Kirk Ruths, MD   Evaluation Performed:  Follow-Up Visit  Chief Complaint: FU CAD  History of Present Illness:    FU CAD; history of coronary artery disease, status post coronary artery bypass graft surgery. This was performed in June 2006. Patient had a LIMA to the LAD, saphenous vein graft to the diagonal, saphenous vein graft to the acute marginal and saphenous vein graft to the PDA. Carotid Dopplers August 2015 showed no significant stenosis. Nuclear study August 2015 showed ejection fraction 55% but no ischemia or infarction. Possible mild decrease in systolic blood pressure at peak exercise.Abdominal ultrasound June 2017 showed no aneurysm.Patient seen 8/19 with newly diagnosed atrial fibrillation. He was volume overloaded and was diuresed with Lasix. Had DCCV of atypical atrial flutter 11/19.   Echocardiogram repeated May 2020 and showed normal LV function, moderate left atrial enlargement, mild to moderate aortic stenosis with mean gradient 15 mmHg.  Since last seen pt denies CP, dyspnea; no syncope or bleeding.  The  patient does not have symptoms concerning for COVID-19 infection (fever, chills, cough, or new shortness of breath).    Past Medical History:  Diagnosis Date  . Adjustment disorder with depressed mood 09/07/2007  . Colon polyps    Tubular Adenoma 2010  . CONGESTIVE HEART FAILURE 12/09/2008  . CORONARY ARTERY DISEASE 2006  . HYPERLIPIDEMIA 08/09/2006  . HYPERTENSION 08/09/2006  . MYOCARDIAL INFARCTION, HX OF 07/07/2004  . 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 OBESITY 07/19/2008   Past Surgical History:  Procedure Laterality Date  . CARDIOVERSION N/A 12/14/2017   Procedure: CARDIOVERSION;  Surgeon: [name], MD;  Location: [facility];  Service: Cardiovascular;  Laterality: N/A;  . COLONOSCOPY    . CORONARY ARTERY BYPASS GRAFT  2006     Current Meds  Medication Sig  . acetaminophen (TYLENOL) 500 MG tablet Take 1,000 mg by mouth daily as needed for moderate pain or headache.  Marland Kitchen atorvastatin (LIPITOR) 80 MG tablet TAKE 1 TABLET BY MOUTH EVERY DAY  . ELIQUIS 5 MG TABS tablet TAKE 1 TABLET BY MOUTH TWICE A DAY  . furosemide (LASIX) 20 MG tablet Take 1 tablet (20 mg total) by mouth every other day.  . metoprolol succinate (TOPROL-XL) 25 MG 24 hr tablet Take 1 tablet (25 mg total) by mouth daily. Take with or immediately following a meal.  . polyethylene glycol powder (GLYCOLAX/MIRALAX) powder Take 17 g by mouth 2 (two) times daily as needed. (Patient taking differently: Take 17 g by mouth 2 (two) times daily as needed for mild constipation. )  . potassium chloride SA (KLOR-CON M20) 20 MEQ tablet Take 1 tablet (  20 mEq total) by mouth every other day.     Allergies:   Patient has no known allergies.   Social History   Tobacco Use  . Smoking status: Never Smoker  . Smokeless tobacco: Never Used  Substance Use Topics  . Alcohol use: Yes    Alcohol/week: 1.0 standard drinks    Types: 1 Cans of beer per week    Comment: occasional  . Drug use: No     Family Hx: The patient's  family history includes Bipolar disorder in his brother; Heart disease in his father; Hyperlipidemia in his mother; Hypertension in his mother; Ulcers in his father. There is no history of Colon cancer.  ROS:   Please see the history of present illness.    No fevers, chills or productive cough. All other systems reviewed and are negative.  Recent Labs: 07/04/2017: ALT 30; TSH 2.70 09/05/2017: BNP 204.2; Platelets 91 12/14/2017: Hemoglobin 12.6 02/13/2018: BUN 11; Creatinine, Ser 0.76; Potassium 4.4; Sodium 142   Recent Lipid Panel Lab Results  Component Value Date/Time   CHOL 111 07/04/2017 03:35 PM   TRIG 121.0 07/04/2017 03:35 PM   HDL 28.70 (L) 07/04/2017 03:35 PM   CHOLHDL 4 07/04/2017 03:35 PM   LDLCALC 58 07/04/2017 03:35 PM   LDLDIRECT 198.2 07/11/2009 09:25 AM    Wt Readings from Last 3 Encounters:  06/16/18 226 lb (102.5 kg)  04/11/18 230 lb 12.8 oz (104.7 kg)  03/16/18 237 lb (107.5 kg)     Objective:    Vital Signs:  BP 129/60   Pulse 65   Ht 5\' 6"  (1.676 m)   Wt 226 lb (102.5 kg)   BMI 36.48 kg/m    VITAL SIGNS:  reviewed  No acute distress Answers questions appropriately Normal affect Remainder of physical examination not performed (telehealth visit; coronavirus pandemic)  ASSESSMENT & PLAN:    1. Atrial flutter-by history patient remains in sinus rhythm status post cardioversion.  We will continue with Toprol for rate control if atrial arrhythmias recur.  Continue apixaban at present dose.  We can consider antiarrhythmic therapy in the future if needed. Check Hgb and renal function.  2. History of mild to moderate aortic stenosis-plan repeat echocardiogram 5/21. 3. Coronary artery disease-no chest pain.  Continue medical therapy with statin.  No aspirin given need for anticoagulation. 4. Hypertension-patient's blood pressure is controlled.  Continue present medications. 5. Hyperlipidemia-continue statin. Check lipids and liver. 6. Chronic combined  systolic/diastolic congestive heart failure-by history his volume status is stable.  Continue present dose of Lasix.  We discussed fluid restriction and low-sodium diet. 7. Possible obstructive sleep apnea-Per Dr. Claiborne Billings.  COVID-19 Education: The importance of social distancing was discussed today.  Time:   Today, I have spent 12 minutes with the patient with telehealth technology discussing the above problems.     Medication Adjustments/Labs and Tests Ordered: Current medicines are reviewed at length with the patient today.  Concerns regarding medicines are outlined above.   Tests Ordered: No orders of the defined types were placed in this encounter.   Medication Changes: No orders of the defined types were placed in this encounter.   Disposition:  Follow up in 6 month(s)  Signed, Kirk Ruths, MD  06/16/2018 1:20 PM    [facility]

## 2018-06-16 ENCOUNTER — Encounter: Payer: Self-pay | Admitting: Cardiology

## 2018-06-16 ENCOUNTER — Telehealth (INDEPENDENT_AMBULATORY_CARE_PROVIDER_SITE_OTHER): Payer: 59 | Admitting: Cardiology

## 2018-06-16 VITALS — BP 129/60 | HR 65 | Ht 66.0 in | Wt 226.0 lb

## 2018-06-16 DIAGNOSIS — I10 Essential (primary) hypertension: Secondary | ICD-10-CM

## 2018-06-16 DIAGNOSIS — E7800 Pure hypercholesterolemia, unspecified: Secondary | ICD-10-CM

## 2018-06-16 DIAGNOSIS — I350 Nonrheumatic aortic (valve) stenosis: Secondary | ICD-10-CM

## 2018-06-16 DIAGNOSIS — I2510 Atherosclerotic heart disease of native coronary artery without angina pectoris: Secondary | ICD-10-CM

## 2018-06-16 NOTE — Patient Instructions (Signed)
Medication Instructions:  NO CHANGE If you need a refill on your cardiac medications before your next appointment, please call your pharmacy.   Lab work: Your physician recommends that you return for lab work in: [place] If you have labs (blood work) drawn today and your tests are completely normal, you will receive your results only by: Marland Kitchen MyChart Message (if you have MyChart) OR . A paper copy in the mail If you have any lab test that is abnormal or we need to change your treatment, we will call you to review the results.  Follow-Up: At [facility], you and your health needs are our priority.  As part of our continuing mission to provide you with exceptional heart care, we have created designated Provider Care Teams.  These Care Teams include your primary Cardiologist (physician) and Advanced Practice Providers (APPs -  Physician Assistants and Nurse Practitioners) who all work together to provide you with the care you need, when you need it. You will need a follow up appointment in 6 months.  Please call our office 2 months in advance to schedule this appointment.  You may see Kirk Ruths, MD or one of the following Advanced Practice Providers on your designated Care Team:   Kerin Ransom, PA-C Roby Lofts, [place] . Sande Rives, PA-C

## 2018-06-21 ENCOUNTER — Telehealth: Payer: Self-pay | Admitting: Internal Medicine

## 2018-06-21 MED ORDER — TAMSULOSIN HCL 0.4 MG PO CAPS: ORAL_CAPSULE | ORAL | 0 refills | Status: DC

## 2018-06-21 NOTE — Telephone Encounter (Signed)
Copied from [place] [id number]. Topic: Quick Communication - Rx Refill/Question >> Jun 21, 2018  1:43 PM Virl Axe D wrote: Medication: tamsulosin (FLOMAX) 0.4 MG CAPS capsule   Has the patient contacted their pharmacy? Yes.   (Agent: If no, request that the patient contact the pharmacy for the refill.) (Agent: If yes, when and what did the pharmacy advise?)  Preferred Pharmacy (with phone number or street name): [id number] - [place], [place] - [address] [phone] (Phone) [phone] (Fax)    Agent: Please be advised that RX refills may take up to 3 business days. We ask that you follow-up with your pharmacy.

## 2018-06-24 ENCOUNTER — Other Ambulatory Visit: Payer: Self-pay | Admitting: Cardiology

## 2018-08-06 ENCOUNTER — Other Ambulatory Visit: Payer: Self-pay | Admitting: Internal Medicine

## 2018-09-09 ENCOUNTER — Other Ambulatory Visit: Payer: Self-pay | Admitting: Internal Medicine

## 2018-09-12 ENCOUNTER — Encounter: Payer: 59 | Admitting: Internal Medicine

## 2018-10-16 ENCOUNTER — Encounter: Payer: Self-pay | Admitting: Family Medicine

## 2018-10-16 ENCOUNTER — Ambulatory Visit: Payer: 59 | Admitting: Family Medicine

## 2018-10-16 ENCOUNTER — Other Ambulatory Visit: Payer: Self-pay

## 2018-10-16 VITALS — BP 120/68 | HR 73 | Temp 98.6°F | Ht 66.0 in | Wt 232.0 lb

## 2018-10-16 DIAGNOSIS — M79605 Pain in left leg: Secondary | ICD-10-CM | POA: Diagnosis not present

## 2018-10-16 NOTE — Progress Notes (Signed)
   Subjective:    Patient ID: [name], male    DOB: 1950/04/27, 68 y.o.   MRN: [id number]  HPI Here for 3 days of mild soreness in the left lower leg. He had cellulitis in the right leg in the past and he is concerned it could be recurring. He has chronic swelling in both legs. No SOB or chest pain. No redness or warmth in the leg. He [place]    Review of Systems  Constitutional: Negative.   Respiratory: Negative.   Cardiovascular: Positive for leg swelling. Negative for chest pain and palpitations.  Musculoskeletal: Positive for myalgias.  Skin: Negative.        Objective:   Physical Exam Constitutional:      Appearance: Normal appearance.  Cardiovascular:     Rate and Rhythm: Normal rate and regular rhythm.     Pulses: Normal pulses.     Heart sounds: Normal heart sounds.  Pulmonary:     Effort: Pulmonary effort is normal.     Breath sounds: Normal breath sounds.  Musculoskeletal:     Comments: 3+ edema in both lower legs. There is a well defined area on the lateral left lower leg just above the ankle that is tender. No redness or warmth or ecchymosis. The ankle has full ROM   Neurological:     Mental Status: He is alert.           Assessment & Plan:  This seems to be a muscular pain. He likely strained a muscle while golfing last weekend. It should resolve on its own in a few days. Recheck prn.  Alysia Penna, MD

## 2018-10-16 NOTE — Patient Instructions (Addendum)
Health Maintenance Due  Topic Date Due  . PNA vac Low Risk Adult (2 of 2 - PPSV23) 07/05/2018  . INFLUENZA VACCINE  09/02/2018    Depression screen [facility] 2/9 04/11/2018 07/04/2017 07/18/2015  Decreased Interest 0 0 0  Down, Depressed, Hopeless 0 0 0  PHQ - 2 Score 0 0 0

## 2018-10-19 ENCOUNTER — Other Ambulatory Visit: Payer: Self-pay | Admitting: Cardiology

## 2018-11-25 ENCOUNTER — Other Ambulatory Visit: Payer: Self-pay | Admitting: Cardiology

## 2018-11-25 LAB — COMPREHENSIVE METABOLIC PANEL WITH GFR
ALT: 30 [IU]/L (ref 0–44)
AST: 50 [IU]/L — ABNORMAL HIGH (ref 0–40)
Albumin/Globulin Ratio: 1.1 — ABNORMAL LOW (ref 1.2–2.2)
Albumin: 3.6 g/dL — ABNORMAL LOW (ref 3.8–4.8)
Alkaline Phosphatase: 74 [IU]/L (ref 39–117)
BUN/Creatinine Ratio: 18 (ref 10–24)
BUN: 15 mg/dL (ref 8–27)
Bilirubin Total: 1.3 mg/dL — ABNORMAL HIGH (ref 0.0–1.2)
CO2: 22 mmol/L (ref 20–29)
Calcium: 9.2 mg/dL (ref 8.6–10.2)
Chloride: 105 mmol/L (ref 96–106)
Creatinine, Ser: 0.83 mg/dL (ref 0.76–1.27)
GFR calc Af Amer: 104 mL/min/{1.73_m2}
GFR calc non Af Amer: 90 mL/min/{1.73_m2}
Globulin, Total: 3.4 g/dL (ref 1.5–4.5)
Glucose: 121 mg/dL — ABNORMAL HIGH (ref 65–99)
Potassium: 4.3 mmol/L (ref 3.5–5.2)
Sodium: 138 mmol/L (ref 134–144)
Total Protein: 7 g/dL (ref 6.0–8.5)

## 2018-11-25 LAB — CBC
Hematocrit: 40.4 % (ref 37.5–51.0)
Hemoglobin: 13.3 g/dL (ref 13.0–17.7)
MCH: 31.4 pg (ref 26.6–33.0)
MCHC: 32.9 g/dL (ref 31.5–35.7)
MCV: 95 fL (ref 79–97)
Platelets: 90 10*3/uL — CL (ref 150–450)
RBC: 4.24 x10E6/uL (ref 4.14–5.80)
RDW: 13.5 % (ref 11.6–15.4)
WBC: 5 10*3/uL (ref 3.4–10.8)

## 2018-11-25 LAB — LIPID PANEL
Chol/HDL Ratio: 2.5 ratio (ref 0.0–5.0)
Cholesterol, Total: 113 mg/dL (ref 100–199)
HDL: 45 mg/dL (ref >39)
LDL Chol Calc (NIH): 53 mg/dL (ref 0–99)
Triglycerides: 72 mg/dL (ref 0–149)
VLDL Cholesterol Cal: 15 mg/dL (ref 5–40)

## 2018-11-29 ENCOUNTER — Other Ambulatory Visit: Payer: Self-pay

## 2018-11-29 ENCOUNTER — Encounter: Payer: Self-pay | Admitting: Internal Medicine

## 2018-11-29 ENCOUNTER — Ambulatory Visit (INDEPENDENT_AMBULATORY_CARE_PROVIDER_SITE_OTHER): Payer: 59 | Admitting: Internal Medicine

## 2018-11-29 VITALS — BP 118/70 | HR 62 | Temp 98.5°F | Ht 65.0 in | Wt 232.7 lb

## 2018-11-29 DIAGNOSIS — Z0000 Encounter for general adult medical examination without abnormal findings: Secondary | ICD-10-CM

## 2018-11-29 DIAGNOSIS — I350 Nonrheumatic aortic (valve) stenosis: Secondary | ICD-10-CM

## 2018-11-29 DIAGNOSIS — Z860100 Personal history of colon polyps, unspecified: Secondary | ICD-10-CM

## 2018-11-29 DIAGNOSIS — I10 Essential (primary) hypertension: Secondary | ICD-10-CM

## 2018-11-29 DIAGNOSIS — R7302 Impaired glucose tolerance (oral): Secondary | ICD-10-CM

## 2018-11-29 DIAGNOSIS — E785 Hyperlipidemia, unspecified: Secondary | ICD-10-CM

## 2018-11-29 DIAGNOSIS — Z23 Encounter for immunization: Secondary | ICD-10-CM

## 2018-11-29 DIAGNOSIS — I484 Atypical atrial flutter: Secondary | ICD-10-CM

## 2018-11-29 DIAGNOSIS — I5042 Chronic combined systolic (congestive) and diastolic (congestive) heart failure: Secondary | ICD-10-CM

## 2018-11-29 LAB — HEMOGLOBIN A1C: Hgb A1c MFr Bld: 6 % (ref 4.6–6.5)

## 2018-11-29 NOTE — Patient Instructions (Signed)
-Nice seeing you today!!  -Lab work today; will notify you once results are available.  -Flu and penumonia vaccines today.  -Remember shingles vaccine at your pharmacy.  -Schedule follow up in 3-4 months.   Preventive Care 75 Years and Older, Male Preventive care refers to lifestyle choices and visits with your health care provider that can promote health and wellness. This includes:  A yearly physical exam. This is also called an annual well check.  Regular dental and eye exams.  Immunizations.  Screening for certain conditions.  Healthy lifestyle choices, such as diet and exercise. What can I expect for my preventive care visit? Physical exam Your health care provider will check:  Height and weight. These may be used to calculate body mass index (BMI), which is a measurement that tells if you are at a healthy weight.  Heart rate and blood pressure.  Your skin for abnormal spots. Counseling Your health care provider may ask you questions about:  Alcohol, tobacco, and drug use.  Emotional well-being.  Home and relationship well-being.  Sexual activity.  Eating habits.  History of falls.  Memory and ability to understand (cognition).  Work and work Statistician. What immunizations do I need?  Influenza (flu) vaccine  This is recommended every year. Tetanus, diphtheria, and pertussis (Tdap) vaccine  You may need a Td booster every 10 years. Varicella (chickenpox) vaccine  You may need this vaccine if you have not already been vaccinated. Zoster (shingles) vaccine  You may need this after age 36. Pneumococcal conjugate (PCV13) vaccine  One dose is recommended after age 6. Pneumococcal polysaccharide (PPSV23) vaccine  One dose is recommended after age 22. Measles, mumps, and rubella (MMR) vaccine  You may need at least one dose of MMR if you were born in 1957 or later. You may also need a second dose. Meningococcal conjugate (MenACWY) vaccine   You may need this if you have certain conditions. Hepatitis A vaccine  You may need this if you have certain conditions or if you travel or work in places where you may be exposed to hepatitis A. Hepatitis B vaccine  You may need this if you have certain conditions or if you travel or work in places where you may be exposed to hepatitis B. Haemophilus influenzae type b (Hib) vaccine  You may need this if you have certain conditions. You may receive vaccines as individual doses or as more than one vaccine together in one shot (combination vaccines). Talk with your health care provider about the risks and benefits of combination vaccines. What tests do I need? Blood tests  Lipid and cholesterol levels. These may be checked every 5 years, or more frequently depending on your overall health.  Hepatitis C test.  Hepatitis B test. Screening  Lung cancer screening. You may have this screening every year starting at age 64 if you have a 30-pack-year history of smoking and currently smoke or have quit within the past 15 years.  Colorectal cancer screening. All adults should have this screening starting at age 17 and continuing until age 37. Your health care provider may recommend screening at age 62 if you are at increased risk. You will have tests every 1-10 years, depending on your results and the type of screening test.  Prostate cancer screening. Recommendations will vary depending on your family history and other risks.  Diabetes screening. This is done by checking your blood sugar (glucose) after you have not eaten for a while (fasting). You may have this done  every 1-3 years.  Abdominal aortic aneurysm (AAA) screening. You may need this if you are a current or former smoker.  Sexually transmitted disease (STD) testing. Follow these instructions at home: Eating and drinking  Eat a diet that includes fresh fruits and vegetables, whole grains, lean protein, and low-fat dairy products.  Limit your intake of foods with high amounts of sugar, saturated fats, and salt.  Take vitamin and mineral supplements as recommended by your health care provider.  Do not drink alcohol if your health care provider tells you not to drink.  If you drink alcohol: ? Limit how much you have to 0-2 drinks a day. ? Be aware of how much alcohol is in your drink. In the [place] one drink equals one 12 oz bottle of beer (355 mL), one 5 oz glass of wine (148 mL), or one 1 oz glass of hard liquor (44 mL). Lifestyle  Take daily care of your teeth and gums.  Stay active. Exercise for at least 30 minutes on 5 or more days each week.  Do not use any products that contain nicotine or tobacco, such as cigarettes, e-cigarettes, and chewing tobacco. If you need help quitting, ask your health care provider.  If you are sexually active, practice safe sex. Use a condom or other form of protection to prevent STIs (sexually transmitted infections).  Talk with your health care provider about taking a low-dose aspirin or statin. What's next?  Visit your health care provider once a year for a well check visit.  Ask your health care provider how often you should have your eyes and teeth checked.  Stay up to date on all vaccines. This information is not intended to replace advice given to you by your health care provider. Make sure you discuss any questions you have with your health care provider. Document Released: 02/14/2015 Document Revised: 01/12/2018 Document Reviewed: 01/12/2018 Elsevier Patient Education  2020 [employer].

## 2018-11-29 NOTE — Progress Notes (Signed)
Established Patient Office Visit     CC/Reason for Visit: Annual preventive exam  HPI: [name] is a 68 y.o. male who is coming in today for the above mentioned reasons. 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 Stanford Breed, atrial fibrillation status post DCCV on Eliquis and Toprol, obstructive sleep apnea on nightly CPAP, hypertension, hyperlipidemia, morbid obesity, depression.  He comes in today for his annual exam.  He has no acute complaints.  He has routine eye and dental care.  He is due for flu, Pneumovax, shingles vaccination series.  He had a colonoscopy in 2019 and is a 5-year callback.   Past Medical/Surgical History: Past Medical History:  Diagnosis Date  . Adjustment disorder with depressed mood 09/07/2007  . Colon polyps    Tubular Adenoma 2010  . CONGESTIVE HEART FAILURE 12/09/2008  . CORONARY ARTERY DISEASE 2006  . HYPERLIPIDEMIA 08/09/2006  . HYPERTENSION 08/09/2006  . MYOCARDIAL INFARCTION, HX OF 07/07/2004  . 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 OBESITY 07/19/2008    Past Surgical History:  Procedure Laterality Date  . CARDIOVERSION N/A 12/14/2017   Procedure: CARDIOVERSION;  Surgeon: [name], MD;  Location: [facility];  Service: Cardiovascular;  Laterality: N/A;  . COLONOSCOPY    . CORONARY ARTERY BYPASS GRAFT  2006    Social History:  reports that he has never smoked. He has never used smokeless tobacco. He reports current alcohol use of about 1.0 standard drinks of alcohol per week. He reports that he does not use drugs.  Allergies: No Known Allergies  Family History:  Family History  Problem Relation Age of Onset  . Hypertension Mother   . Hyperlipidemia Mother   . Heart disease Father   . Ulcers Father   . Bipolar disorder Brother   . Colon cancer Neg Hx      Current Outpatient Medications:  .  acetaminophen  (TYLENOL) 500 MG tablet, Take 1,000 mg by mouth daily as needed for moderate pain or headache., Disp: , Rfl:  .  atorvastatin (LIPITOR) 80 MG tablet, TAKE 1 TABLET BY MOUTH EVERY DAY, Disp: 90 tablet, Rfl: 1 .  ELIQUIS 5 MG TABS tablet, TAKE 1 TABLET BY MOUTH TWICE A DAY, Disp: 60 tablet, Rfl: 5 .  furosemide (LASIX) 20 MG tablet, TAKE 1 TABLET BY MOUTH EVERY DAY, Disp: 30 tablet, Rfl: 11 .  KLOR-CON M20 20 MEQ tablet, TAKE 1 TABLET BY MOUTH EVERY DAY, Disp: 30 tablet, Rfl: 11 .  metoprolol succinate (TOPROL-XL) 25 MG 24 hr tablet, TAKE 1 TABLET BY MOUTH EVERY DAY WITH OR IMMEDIATELY FOLLOWING A MEAL, Disp: 30 tablet, Rfl: 11 .  polyethylene glycol powder (GLYCOLAX/MIRALAX) powder, Take 17 g by mouth 2 (two) times daily as needed. (Patient taking differently: Take 17 g by mouth 2 (two) times daily as needed for mild constipation. ), Disp: 3350 g, Rfl: 3 .  tamsulosin (FLOMAX) 0.4 MG CAPS capsule, TAKE ONE CAPSULE BY MOUTH EVERY DAY AFTER SUPPER, Disp: 90 capsule, Rfl: 1  Review of Systems:  Constitutional: Denies fever, chills, diaphoresis, appetite change and fatigue.  HEENT: Denies photophobia, eye pain, redness, hearing loss, ear pain, congestion, sore throat, rhinorrhea, sneezing, mouth sores, trouble swallowing, neck pain, neck stiffness and tinnitus.   Respiratory: Denies SOB, DOE, cough, chest tightness,  and wheezing.   Cardiovascular: Denies chest pain, palpitations and leg swelling.  Gastrointestinal: Denies nausea, vomiting, abdominal  pain, diarrhea, constipation, blood in stool and abdominal distention.  Genitourinary: Denies dysuria, urgency, frequency, hematuria, flank pain and difficulty urinating.  Endocrine: Denies: hot or cold intolerance, sweats, changes in hair or nails, polyuria, polydipsia. Musculoskeletal: Denies myalgias, back pain, joint swelling, arthralgias and gait problem.  Skin: Denies pallor, rash and wound.  Neurological: Denies dizziness, seizures, syncope,  weakness, light-headedness, numbness and headaches.  Hematological: Denies adenopathy. Easy bruising, personal or family bleeding history  Psychiatric/Behavioral: Denies suicidal ideation, mood changes, confusion, nervousness, sleep disturbance and agitation    Physical Exam: Vitals:   11/29/18 0809  BP: 118/70  Pulse: 62  Temp: 98.5 F (36.9 C)  TempSrc: Temporal  SpO2: 96%  Weight: 232 lb 11.2 oz (105.6 kg)  Height: _0  (1.651 m)    Body mass index is 38.72 kg/m.   Constitutional: NAD, calm, comfortable Eyes: PERRL, lids and conjunctivae normal, wears corrective lenses ENMT: Mucous membranes are moist. Tympanic membrane is pearly white, no erythema or bulging. Neck: normal, supple, no masses, no thyromegaly Respiratory: clear to auscultation bilaterally, no wheezing, no crackles. Normal respiratory effort. No accessory muscle use.  Cardiovascular: Regular rate and rhythm, positive systolic ejection murmur best heard at the right upper sternal border, no rubs or gallops. No extremity edema. 2+ pedal pulses. No carotid bruits.  Abdomen: no tenderness, no masses palpated. No hepatosplenomegaly. Bowel sounds positive.  Musculoskeletal: no clubbing / cyanosis. No joint deformity upper and lower extremities. Good ROM, no contractures. Normal muscle tone.  Skin: no rashes, lesions, ulcers. No induration Neurologic: CN 2-12 grossly intact. Sensation intact, DTR normal. Strength 5/5 in all 4.  Psychiatric: Normal judgment and insight. Alert and oriented x 3. Normal mood.    Impression and Plan:  Encounter for preventive health examination -Has routine eye and dental care. -He has agreed to receive flu and Pneumovax in office today, he would like to defer shingles for next visit. -Healthy lifestyle has been discussed in detail. -Screening labs today. -Colonoscopy in 2019, he is 5-year callback. -Current prostate cancer screening guidelines have been discussed in detail.  He is  not in a high risk factor group, hence we have decided to not pursue prostate cancer screening today.  Atypical atrial flutter (HCC) -Appears to be in sinus rhythm today.  Nonrheumatic aortic valve stenosis -Followed by cardiology  Personal history of colonic polyps -Is on a 5-year callback schedule with last colonoscopy being in January 2019  Impaired glucose tolerance  - Plan: Hemoglobin A1c  Morbid obesity ([facility]) -Discussed healthy lifestyle, including increased physical activity and better food choices to promote weight loss.  Essential hypertension -Well-controlled on current regimen.  Dyslipidemia -Well-controlled with a recent LDL of 58.  Chronic combined systolic and diastolic heart failure (HCC) -Compensated, followed by cardiology, on beta-blocker, statin.    Patient Instructions  -Nice seeing you today!!  -Lab work today; will notify you once results are available.  -Flu and penumonia vaccines today.  -Remember shingles vaccine at your pharmacy.  -Schedule follow up in 3-4 months.   Preventive Care 12 Years and Older, Male Preventive care refers to lifestyle choices and visits with your health care provider that can promote health and wellness. This includes:  A yearly physical exam. This is also called an annual well check.  Regular dental and eye exams.  Immunizations.  Screening for certain conditions.  Healthy lifestyle choices, such as diet and exercise. What can I expect for my preventive care visit? Physical exam Your health care provider will check:  Height and weight. These may be used to calculate body mass index (BMI), which is a measurement that tells if you are at a healthy weight.  Heart rate and blood pressure.  Your skin for abnormal spots. Counseling Your health care provider may ask you questions about:  Alcohol, tobacco, and drug use.  Emotional well-being.  Home and relationship well-being.  Sexual activity.  Eating  habits.  History of falls.  Memory and ability to understand (cognition).  Work and work Statistician. What immunizations do I need?  Influenza (flu) vaccine  This is recommended every year. Tetanus, diphtheria, and pertussis (Tdap) vaccine  You may need a Td booster every 10 years. Varicella (chickenpox) vaccine  You may need this vaccine if you have not already been vaccinated. Zoster (shingles) vaccine  You may need this after age 8. Pneumococcal conjugate (PCV13) vaccine  One dose is recommended after age 88. Pneumococcal polysaccharide (PPSV23) vaccine  One dose is recommended after age 63. Measles, mumps, and rubella (MMR) vaccine  You may need at least one dose of MMR if you were born in 1957 or later. You may also need a second dose. Meningococcal conjugate (MenACWY) vaccine  You may need this if you have certain conditions. Hepatitis A vaccine  You may need this if you have certain conditions or if you travel or work in places where you may be exposed to hepatitis A. Hepatitis B vaccine  You may need this if you have certain conditions or if you travel or work in places where you may be exposed to hepatitis B. Haemophilus influenzae type b (Hib) vaccine  You may need this if you have certain conditions. You may receive vaccines as individual doses or as more than one vaccine together in one shot (combination vaccines). Talk with your health care provider about the risks and benefits of combination vaccines. What tests do I need? Blood tests  Lipid and cholesterol levels. These may be checked every 5 years, or more frequently depending on your overall health.  Hepatitis C test.  Hepatitis B test. Screening  Lung cancer screening. You may have this screening every year starting at age 32 if you have a 30-pack-year history of smoking and currently smoke or have quit within the past 15 years.  Colorectal cancer screening. All adults should have this  screening starting at age 19 and continuing until age 89. Your health care provider may recommend screening at age 69 if you are at increased risk. You will have tests every 1-10 years, depending on your results and the type of screening test.  Prostate cancer screening. Recommendations will vary depending on your family history and other risks.  Diabetes screening. This is done by checking your blood sugar (glucose) after you have not eaten for a while (fasting). You may have this done every 1-3 years.  Abdominal aortic aneurysm (AAA) screening. You may need this if you are a current or former smoker.  Sexually transmitted disease (STD) testing. Follow these instructions at home: Eating and drinking  Eat a diet that includes fresh fruits and vegetables, whole grains, lean protein, and low-fat dairy products. Limit your intake of foods with high amounts of sugar, saturated fats, and salt.  Take vitamin and mineral supplements as recommended by your health care provider.  Do not drink alcohol if your health care provider tells you not to drink.  If you drink alcohol: ? Limit how much you have to 0-2 drinks a day. ? Be aware of how much  alcohol is in your drink. In the [place] one drink equals one 12 oz bottle of beer (355 mL), one 5 oz glass of wine (148 mL), or one 1 oz glass of hard liquor (44 mL). Lifestyle  Take daily care of your teeth and gums.  Stay active. Exercise for at least 30 minutes on 5 or more days each week.  Do not use any products that contain nicotine or tobacco, such as cigarettes, e-cigarettes, and chewing tobacco. If you need help quitting, ask your health care provider.  If you are sexually active, practice safe sex. Use a condom or other form of protection to prevent STIs (sexually transmitted infections).  Talk with your health care provider about taking a low-dose aspirin or statin. What's next?  Visit your health care provider once a year for a well check  visit.  Ask your health care provider how often you should have your eyes and teeth checked.  Stay up to date on all vaccines. This information is not intended to replace advice given to you by your health care provider. Make sure you discuss any questions you have with your health care provider. Document Released: 02/14/2015 Document Revised: 01/12/2018 Document Reviewed: 01/12/2018 Elsevier Patient Education  2020 [place], MD [facility]

## 2018-11-29 NOTE — Addendum Note (Signed)
Addended by: Westley Hummer B on: 11/29/2018 04:39 PM   Modules accepted: Orders

## 2018-11-30 LAB — TSH: TSH: 2.53 u[IU]/mL (ref 0.35–4.50)

## 2018-12-01 LAB — VITAMIN B12: Vitamin B-12: 231 pg/mL (ref 211–911)

## 2018-12-01 LAB — VITAMIN D 25 HYDROXY (VIT D DEFICIENCY, FRACTURES): VITD: 33.02 ng/mL (ref 30.00–100.00)

## 2019-01-05 NOTE — Progress Notes (Signed)
HPI: FU CAD and atrial flutter; history of coronary artery disease, status post coronary artery bypass graft surgery performed in June 2006. Patient had a LIMA to the LAD, saphenous vein graft to the diagonal, saphenous vein graft to the acute marginal and saphenous vein graft to the PDA. Carotid Dopplers August 2015 showed no significant stenosis. Nuclear study August 2015 showed ejection fraction 55% but no ischemia or infarction. Possible mild decrease in systolic blood pressure at peak exercise.Abdominal ultrasound June 2017 showed no aneurysm.Patient seen 8/19 with newly diagnosed atrial fibrillation. He was volume overloaded and was diuresed with Lasix.Had DCCV of atypical atrial flutter 11/19.Echocardiogram repeated May 2020 and showed normal LV function, moderate left atrial enlargement, mild to moderate aortic stenosis with mean gradient 15 mmHg.  Since last seen  patient denies dyspnea, chest pain, palpitations or syncope.  No bleeding.  Chronic pedal edema.  Current Outpatient Medications  Medication Sig Dispense Refill   acetaminophen (TYLENOL) 500 MG tablet Take 1,000 mg by mouth daily as needed for moderate pain or headache.     atorvastatin (LIPITOR) 80 MG tablet TAKE 1 TABLET BY MOUTH EVERY DAY 90 tablet 1   ELIQUIS 5 MG TABS tablet TAKE 1 TABLET BY MOUTH TWICE A DAY 60 tablet 5   furosemide (LASIX) 20 MG tablet TAKE 1 TABLET BY MOUTH EVERY DAY 30 tablet 11   KLOR-CON M20 20 MEQ tablet TAKE 1 TABLET BY MOUTH EVERY DAY 30 tablet 11   metoprolol succinate (TOPROL-XL) 25 MG 24 hr tablet TAKE 1 TABLET BY MOUTH EVERY DAY WITH OR IMMEDIATELY FOLLOWING A MEAL 30 tablet 11   tamsulosin (FLOMAX) 0.4 MG CAPS capsule TAKE ONE CAPSULE BY MOUTH EVERY DAY AFTER SUPPER 90 capsule 1   No current facility-administered medications for this visit.     Past Medical History:  Diagnosis Date   Adjustment disorder with depressed mood 09/07/2007   Colon polyps    Tubular Adenoma  2010   CONGESTIVE HEART FAILURE 12/09/2008   CORONARY ARTERY DISEASE 2006   HYPERLIPIDEMIA 08/09/2006   HYPERTENSION 08/09/2006   MYOCARDIAL INFARCTION, HX OF 07/07/2004   NEPHROLITHIASIS, HX OF 08/09/2006   OBESITY 07/19/2008    Past Surgical History:  Procedure Laterality Date   CARDIOVERSION N/A 12/14/2017   Procedure: CARDIOVERSION;  Surgeon: [name], MD;  Location: [place];  Service: Cardiovascular;  Laterality: N/A;   COLONOSCOPY     CORONARY ARTERY BYPASS GRAFT  2006    Social History   Socioeconomic History   Marital status: Single    Spouse name: Not on file   Number of children: Not on file   Years of education: Not on file   Highest education level: Not on file  Occupational History   Not on file  Tobacco Use   Smoking status: Never Smoker   Smokeless tobacco: Never Used  Substance and Sexual Activity   Alcohol use: Yes    Alcohol/week: 1.0 standard drinks    Types: 1 Cans of beer per week    Comment: occasional   Drug use: No   Sexual activity: Not on file  Other Topics Concern   Not on file  Social History Narrative   Not on file   Social Determinants of Health   Financial Resource Strain:    Difficulty of Paying Living Expenses: Not on file  Food Insecurity:    Worried About [place] in the Last Year: Not on file   [employer] of Food  in the Last Year: Not on file  Transportation Needs:    Lack of Transportation (Medical): Not on file   Lack of Transportation (Non-Medical): Not on file  Physical Activity:    Days of Exercise per Week: Not on file   Minutes of Exercise per Session: Not on file  Stress:    Feeling of Stress : Not on file  Social Connections:    Frequency of Communication with Friends and Family: Not on file   Frequency of Social Gatherings with Friends and Family: Not on file   Attends Religious Services: Not on file   Active Member of Clubs or Organizations: Not on file    Attends Archivist Meetings: Not on file   Marital Status: Not on file  Intimate Partner Violence:    Fear of Current or Ex-Partner: Not on file   Emotionally Abused: Not on file   Physically Abused: Not on file   Sexually Abused: Not on file    Family History  Problem Relation Age of Onset   Hypertension Mother    Hyperlipidemia Mother    Heart disease Father    Ulcers Father    Bipolar disorder Brother    Colon cancer Neg Hx     ROS: no fevers or chills, productive cough, hemoptysis, dysphasia, odynophagia, melena, hematochezia, dysuria, hematuria, rash, seizure activity, orthopnea, PND, claudication. Remaining systems are negative.  Physical Exam: Well-developed well-nourished in no acute distress.  Skin is warm and dry.  HEENT is normal.  Neck is supple.  Chest is clear to auscultation with normal expansion.  Cardiovascular exam is regular rate and rhythm.  2/6 systolic murmur left sternal border. Abdominal exam nontender or distended. No masses palpated. Extremities show 1+ edema. neuro grossly intact  A/P  1 history of atrial flutter-patient remains in sinus rhythm in exam.  Continue beta-blocker and apixaban.  We will consider addition of antiarrhythmic in the future if he has more frequent episodes.   2 coronary artery disease-he has not had recurrent chest pain.  Plan to continue statin.  He is not on aspirin given need for anticoagulation.  3 mild to moderate aortic stenosis-plan follow-up echocardiogram May 2021.  4 hypertension-blood pressure controlled.  Continue present medical regimen and follow.  5 hyperlipidemia-continue statin.  6 chronic combined systolic/diastolic congestive heart failure-he is euvolemic today on present medical regimen.  Continue present dose of diuretic.   7 thrombocytopenia-noted on laboratories.  I have asked him to follow-up with primary care for this issue.  Kirk Ruths, MD

## 2019-01-12 ENCOUNTER — Encounter: Payer: Self-pay | Admitting: Cardiology

## 2019-01-12 ENCOUNTER — Other Ambulatory Visit: Payer: Self-pay

## 2019-01-12 ENCOUNTER — Ambulatory Visit: Payer: 59 | Admitting: Cardiology

## 2019-01-12 VITALS — BP 112/52 | HR 68 | Ht 65.0 in | Wt 232.0 lb

## 2019-01-12 DIAGNOSIS — E7800 Pure hypercholesterolemia, unspecified: Secondary | ICD-10-CM | POA: Diagnosis not present

## 2019-01-12 DIAGNOSIS — I2510 Atherosclerotic heart disease of native coronary artery without angina pectoris: Secondary | ICD-10-CM

## 2019-01-12 DIAGNOSIS — I10 Essential (primary) hypertension: Secondary | ICD-10-CM

## 2019-01-12 DIAGNOSIS — I350 Nonrheumatic aortic (valve) stenosis: Secondary | ICD-10-CM

## 2019-01-12 NOTE — Patient Instructions (Signed)
Medication Instructions:  NO CHANGE  *If you need a refill on your cardiac medications before your next appointment, please call your pharmacy*  Lab Work: If you have labs (blood work) drawn today and your tests are completely normal, you will receive your results only by: Marland Kitchen MyChart Message (if you have MyChart) OR . A paper copy in the mail If you have any lab test that is abnormal or we need to change your treatment, we will call you to review the results.  Testing/Procedures: Your physician has requested that you have an echocardiogram. Echocardiography is a painless test that uses sound waves to create images of your heart. It provides your doctor with information about the size and shape of your heart and how well your heart's chambers and valves are working. This procedure takes approximately one hour. There are no restrictions for this procedure.[place] 2021    Follow-Up: At [facility], you and your health needs are our priority.  As part of our continuing mission to provide you with exceptional heart care, we have created designated Provider Care Teams.  These Care Teams include your primary Cardiologist (physician) and Advanced Practice Providers (APPs -  Physician Assistants and Nurse Practitioners) who all work together to provide you with the care you need, when you need it.  Your next appointment:   6 month(s)  The format for your next appointment:   Either In Person or Virtual  Provider:   You may see Kirk Ruths, MD or one of the following Advanced Practice Providers on your designated Care Team:    Kerin Ransom, PA-C  [place], [place]  Coletta Memos, [place]

## 2019-01-22 ENCOUNTER — Other Ambulatory Visit: Payer: Self-pay | Admitting: Cardiology

## 2019-02-04 ENCOUNTER — Other Ambulatory Visit: Payer: Self-pay | Admitting: Internal Medicine

## 2019-03-15 ENCOUNTER — Ambulatory Visit: Payer: 59 | Admitting: Internal Medicine

## 2019-03-16 ENCOUNTER — Ambulatory Visit: Payer: 59 | Admitting: Cardiovascular Disease

## 2019-05-10 ENCOUNTER — Ambulatory Visit: Payer: 59 | Admitting: Internal Medicine

## 2019-05-21 ENCOUNTER — Ambulatory Visit: Payer: 59 | Admitting: Cardiovascular Disease

## 2019-06-14 ENCOUNTER — Ambulatory Visit (HOSPITAL_COMMUNITY): Payer: 59 | Attending: Cardiovascular Disease

## 2019-06-14 ENCOUNTER — Other Ambulatory Visit: Payer: Self-pay

## 2019-06-14 DIAGNOSIS — I350 Nonrheumatic aortic (valve) stenosis: Secondary | ICD-10-CM | POA: Insufficient documentation

## 2019-07-16 NOTE — Progress Notes (Signed)
HPI: FU CAD, AS and atrial flutter/fibrillation; history of coronary artery disease, status post coronary artery bypass graft surgery performed in June 2006. Patient had a LIMA to the LAD, saphenous vein graft to the diagonal, saphenous vein graft to the acute marginal and saphenous vein graft to the PDA. Carotid Dopplers August 2015 showed no significant stenosis. Nuclear study August 2015 showed ejection fraction 55% but no ischemia or infarction. Possible mild decrease in systolic blood pressure at peak exercise.Abdominal ultrasound June 2017 showed no aneurysm.Patient seen8/19with newly diagnosed atrial fibrillation. He was volume overloaded and was diuresed with Lasix.Had DCCV of atypical atrial flutter 11/19.Echocardiogram May 2021 showed normal LV function, grade 3 DD, moderate biatrial enlargement, mild to moderate aortic stenosis withmean gradient 18 mmHg. Since last seen  patient denies increased dyspnea, chest pain, palpitations or syncope.  Chronic mild pedal edema.  Current Outpatient Medications  Medication Sig Dispense Refill  . acetaminophen (TYLENOL) 500 MG tablet Take 1,000 mg by mouth daily as needed for moderate pain or headache.    Marland Kitchen atorvastatin (LIPITOR) 80 MG tablet TAKE 1 TABLET BY MOUTH EVERY DAY 30 tablet 5  . ELIQUIS 5 MG TABS tablet TAKE 1 TABLET BY MOUTH TWICE A DAY 60 tablet 6  . furosemide (LASIX) 20 MG tablet TAKE 1 TABLET BY MOUTH EVERY DAY 30 tablet 11  . KLOR-CON M20 20 MEQ tablet TAKE 1 TABLET BY MOUTH EVERY DAY 30 tablet 11  . metoprolol succinate (TOPROL-XL) 25 MG 24 hr tablet TAKE 1 TABLET BY MOUTH EVERY DAY WITH OR IMMEDIATELY FOLLOWING A MEAL 30 tablet 11  . tamsulosin (FLOMAX) 0.4 MG CAPS capsule TAKE ONE CAPSULE BY MOUTH EVERY DAY AFTER SUPPER 90 capsule 1   No current facility-administered medications for this visit.     Past Medical History:  Diagnosis Date  . Adjustment disorder with depressed mood 09/07/2007  . Colon polyps     Tubular Adenoma 2010  . CONGESTIVE HEART FAILURE 12/09/2008  . CORONARY ARTERY DISEASE 2006  . HYPERLIPIDEMIA 08/09/2006  . HYPERTENSION 08/09/2006  . MYOCARDIAL INFARCTION, HX OF 07/07/2004  . 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 OBESITY 07/19/2008    Past Surgical History:  Procedure Laterality Date  . CARDIOVERSION N/A 12/14/2017   Procedure: CARDIOVERSION;  Surgeon: [name], MD;  Location: [place];  Service: Cardiovascular;  Laterality: N/A;  . COLONOSCOPY    . CORONARY ARTERY BYPASS GRAFT  2006    Social History   Socioeconomic History  . Marital status: Single    Spouse name: Not on file  . Number of children: Not on file  . Years of education: Not on file  . Highest education level: Not on file  Occupational History  . Not on file  Tobacco Use  . Smoking status: Never Smoker  . Smokeless tobacco: Never Used  Substance and Sexual Activity  . Alcohol use: Yes    Alcohol/week: 1.0 standard drink    Types: 1 Cans of beer per week    Comment: occasional  . Drug use: No  . Sexual activity: Not on file  Other Topics Concern  . Not on file  Social History Narrative  . Not on file   Social Determinants of Health   Financial Resource Strain:   . Difficulty of Paying Living Expenses:   Food Insecurity:   . Worried About Charity fundraiser in the Last Year:   . Arboriculturist in the Last Year:   Transportation Needs:   .  Lack of Transportation (Medical):   Marland Kitchen Lack of Transportation (Non-Medical):   Physical Activity:   . Days of Exercise per Week:   . Minutes of Exercise per Session:   Stress:   . Feeling of Stress :   Social Connections:   . Frequency of Communication with Friends and Family:   . Frequency of Social Gatherings with Friends and Family:   . Attends Religious Services:   . Active Member of Clubs or Organizations:   . Attends Archivist Meetings:   Marland Kitchen Marital Status:   Intimate Partner Violence:   . Fear of Current or  Ex-Partner:   . Emotionally Abused:   Marland Kitchen Physically Abused:   . Sexually Abused:     Family History  Problem Relation Age of Onset  . Hypertension Mother   . Hyperlipidemia Mother   . Heart disease Father   . Ulcers Father   . Bipolar disorder Brother   . Colon cancer Neg Hx     ROS: no fevers or chills, productive cough, hemoptysis, dysphasia, odynophagia, melena, hematochezia, dysuria, hematuria, rash, seizure activity, orthopnea, PND, claudication. Remaining systems are negative.  Physical Exam: Well-developed well-nourished in no acute distress.  Skin is warm and dry.  HEENT is normal.  Neck is supple.  Chest is clear to auscultation with normal expansion.  Cardiovascular exam is regular rate and rhythm. 2/6 systolic murmur Abdominal exam nontender or distended. No masses palpated. Extremities show 1+ edema. neuro grossly intact  Electrocardiogram today shows sinus rhythm with first-degree AV block, left anterior fascicular block, left atrial hypertrophy, right bundle branch block cannot rule out septal infarct; personally reviewed.  A/P  1 AS-fu echo 5/22.  2 CAD-no CP; continue statin; no ASA given need for apixaban.  Check hemoglobin.  3 h/o atrial flutter-continue beta blocker and apixaban.  4 hypertension-BP controlled; continue present meds and follow.  5 hyperlipidemia-continue statin.  Check lipids and liver.  6 chronic combined systolic/diastolic CHF-euvolemic; continue present dose of lasix.  Check potassium and renal function.  Kirk Ruths, MD

## 2019-07-19 ENCOUNTER — Other Ambulatory Visit: Payer: Self-pay | Admitting: Cardiology

## 2019-07-22 ENCOUNTER — Other Ambulatory Visit: Payer: Self-pay | Admitting: Internal Medicine

## 2019-07-23 ENCOUNTER — Encounter: Payer: Self-pay | Admitting: Cardiology

## 2019-07-23 ENCOUNTER — Ambulatory Visit: Payer: 59 | Admitting: Cardiology

## 2019-07-23 ENCOUNTER — Other Ambulatory Visit: Payer: Self-pay

## 2019-07-23 VITALS — BP 120/62 | HR 65 | Ht 65.0 in | Wt 235.8 lb

## 2019-07-23 DIAGNOSIS — E7800 Pure hypercholesterolemia, unspecified: Secondary | ICD-10-CM | POA: Diagnosis not present

## 2019-07-23 DIAGNOSIS — I2510 Atherosclerotic heart disease of native coronary artery without angina pectoris: Secondary | ICD-10-CM

## 2019-07-23 DIAGNOSIS — I10 Essential (primary) hypertension: Secondary | ICD-10-CM | POA: Diagnosis not present

## 2019-07-23 DIAGNOSIS — I350 Nonrheumatic aortic (valve) stenosis: Secondary | ICD-10-CM

## 2019-07-23 NOTE — Patient Instructions (Signed)
Medication Instructions:  NO CHANGE *If you need a refill on your cardiac medications before your next appointment, please call your pharmacy*   Lab Work: Your physician recommends that you HAVE LAB WORK TODAY If you have labs (blood work) drawn today and your tests are completely normal, you will receive your results only by: . MyChart Message (if you have MyChart) OR . A paper copy in the mail If you have any lab test that is abnormal or we need to change your treatment, we will call you to review the results.   Follow-Up: At [facility], you and your health needs are our priority.  As part of our continuing mission to provide you with exceptional heart care, we have created designated Provider Care Teams.  These Care Teams include your primary Cardiologist (physician) and Advanced Practice Providers (APPs -  Physician Assistants and Nurse Practitioners) who all work together to provide you with the care you need, when you need it.  We recommend signing up for the patient portal called "MyChart".  Sign up information is provided on this After Visit Summary.  MyChart is used to connect with patients for Virtual Visits (Telemedicine).  Patients are able to view lab/test results, encounter notes, upcoming appointments, etc.  Non-urgent messages can be sent to your provider as well.   To learn more about what you can do with MyChart, go to [email or web address].    Your next appointment:   6 month(s)  The format for your next appointment:   Either In Person or Virtual  Provider:   You may see Brian Crenshaw, MD or one of the following Advanced Practice Providers on your designated Care Team:    Luke Kilroy, PA-C  Callie Goodrich, PA-C  Jesse Cleaver, FNP     

## 2019-07-24 LAB — COMPREHENSIVE METABOLIC PANEL
ALT: 37 IU/L (ref 0–44)
AST: 58 IU/L — ABNORMAL HIGH (ref 0–40)
Albumin/Globulin Ratio: 1.1 — ABNORMAL LOW (ref 1.2–2.2)
Albumin: 3.5 g/dL — ABNORMAL LOW (ref 3.8–4.8)
Alkaline Phosphatase: 79 IU/L (ref 48–121)
BUN/Creatinine Ratio: 16 (ref 10–24)
BUN: 12 mg/dL (ref 8–27)
Bilirubin Total: 1.4 mg/dL — ABNORMAL HIGH (ref 0.0–1.2)
CO2: 23 mmol/L (ref 20–29)
Calcium: 9 mg/dL (ref 8.6–10.2)
Chloride: 104 mmol/L (ref 96–106)
Creatinine, Ser: 0.77 mg/dL (ref 0.76–1.27)
GFR calc Af Amer: 107 mL/min/{1.73_m2} (ref >59)
GFR calc non Af Amer: 93 mL/min/{1.73_m2} (ref >59)
Globulin, Total: 3.2 g/dL (ref 1.5–4.5)
Glucose: 135 mg/dL — ABNORMAL HIGH (ref 65–99)
Potassium: 4.5 mmol/L (ref 3.5–5.2)
Sodium: 139 mmol/L (ref 134–144)
Total Protein: 6.7 g/dL (ref 6.0–8.5)

## 2019-07-24 LAB — CBC
Hematocrit: 35.7 % — ABNORMAL LOW (ref 37.5–51.0)
Hemoglobin: 12.1 g/dL — ABNORMAL LOW (ref 13.0–17.7)
MCH: 30.7 pg (ref 26.6–33.0)
MCHC: 33.9 g/dL (ref 31.5–35.7)
MCV: 91 fL (ref 79–97)
Platelets: 82 10*3/uL — CL (ref 150–450)
RBC: 3.94 x10E6/uL — ABNORMAL LOW (ref 4.14–5.80)
RDW: 13.7 % (ref 11.6–15.4)
WBC: 3.9 10*3/uL (ref 3.4–10.8)

## 2019-07-24 LAB — LIPID PANEL
Chol/HDL Ratio: 2.3 ratio (ref 0.0–5.0)
Cholesterol, Total: 111 mg/dL (ref 100–199)
HDL: 48 mg/dL (ref >39)
LDL Chol Calc (NIH): 51 mg/dL (ref 0–99)
Triglycerides: 53 mg/dL (ref 0–149)
VLDL Cholesterol Cal: 12 mg/dL (ref 5–40)

## 2019-07-26 ENCOUNTER — Other Ambulatory Visit: Payer: Self-pay

## 2019-07-26 ENCOUNTER — Encounter: Payer: Self-pay | Admitting: Cardiovascular Disease

## 2019-07-26 ENCOUNTER — Ambulatory Visit: Payer: 59 | Admitting: Cardiovascular Disease

## 2019-07-26 VITALS — BP 130/78 | HR 67 | Temp 98.3°F | Ht 66.0 in | Wt 236.6 lb

## 2019-07-26 DIAGNOSIS — Z951 Presence of aortocoronary bypass graft: Secondary | ICD-10-CM

## 2019-07-26 DIAGNOSIS — G4733 Obstructive sleep apnea (adult) (pediatric): Secondary | ICD-10-CM

## 2019-07-26 DIAGNOSIS — I2510 Atherosclerotic heart disease of native coronary artery without angina pectoris: Secondary | ICD-10-CM | POA: Diagnosis not present

## 2019-07-26 DIAGNOSIS — R600 Localized edema: Secondary | ICD-10-CM

## 2019-07-26 DIAGNOSIS — I10 Essential (primary) hypertension: Secondary | ICD-10-CM

## 2019-07-26 DIAGNOSIS — I358 Other nonrheumatic aortic valve disorders: Secondary | ICD-10-CM

## 2019-07-26 NOTE — Progress Notes (Signed)
Cardiology Office Note    Date:  08/01/2019   ID:  Brace, Welte 09-Sep-1950, MRN [id number]  PCP:  Isaac Bliss, Rayford Halsted, MD  Cardiologist:  Shelva Majestic, MD (sleep): Dr. Pilar Grammes evaluation  History of Present Illness:  [name] is a 69 y.o. male by Dr. Stanford Breed for his primary cardiology care.  He was seen in February 2020 for initial sleep evaluation.  He presents for follow-up evaluation.  [name] has a history of known CAD and underwent CABG revascularization surgery 2006 with a LIMA to his LAD, SVG to the diagonal, SVG to acute marginal and SVG to the PDA.  June 2019 echo Doppler study showed EF 45 to 50% with hypokinesis of the basal to mid inferior wall, mild diastolic dysfunction, mild to moderate aortic stenosis with a mean gradient of 17, mild mitral regurgitation and moderate left atrial enlargement.  In August 2019 he developed atrial fibrillation in the setting of volume overload.  He subsequent underwent DC cardioversion in November 2019.  Due to concerns for obstructive sleep apnea he was referred for a split-night CPAP study.  On the diagnostic portion of the study he was found to have severe sleep apnea with an AHI of 35.0, RDI 38.1 and events were more severe during REM sleep with an AHI of 41.9.  Oxygen nadir was 84%.  CPAP was initiated and was titrated up to 10 cm water pressure but REM sleep was not achieved at this pressure.  His CPAP set up date was November 22, 2017.  [facility] is his DME company.  He has been using a F&P simplex fullface mask, medium size.  I saw him for initial sleep evaluation on March 16, 2018.  A download was obtained in the office  from January 13 through March 14, 2018.  He is meeting Medicare compliance with 28 of 30 days of usage.  He is averaging 4 hours and 52 minutes of CPAP use and 83% of the days had CPAP use over 4 hours.  He has a ResMed air sense 10 AutoSet unit with a minimum pressure of 9  and maximum pressure set at 16.  AHI 0.7.  There are periods of occasional leak.  He typically goes to bed at 11 PM but wakes up at 6 AM.  He notes improved energy since initiating CPAP.  During that evaluation, I spent extensive time discussing the effects of sleep apnea with reference to normal sleep architecture, as well as adverse cardiovascular consequences.  He had very poor sleeping habits.  We discussed different mask options.  We discussed potential increased risk of recurrent nocturnal arrhythmias, blood pressure issues, nocturnal ischemia if CPAP is untreated.  [name] states that he use CPAP initially but apparently since May/June 2020 he stopped using therapy.  Most of the time he falls asleep on a recliner and he has gotten out of the habit of using CPAP.  His sleep is still nonrestorative.  He is not resting well..  I obtained a download and his last date of use was June 13 and 14 in 2020 but had not used treatment at all during the previous month.  When used, his 95th percentile pressure was 14.4 and his AHI was excellent at 0.4.  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  He has had issues with leg swelling.  He plays golf.  When seen last year, his Epworth Sleepiness Scale score endorsed at 5.  An Epworth Sleepiness Scale score was calculated in the office today and this endorsed at 8.  He continues to snore and has had witnessed apnea.  He wakes up often with a dry mouth or sore throat.  He is unaware of recurrent atrial flutter.  Past Medical History:  Diagnosis Date  . Adjustment disorder with depressed mood 09/07/2007  . Colon polyps    Tubular Adenoma 2010  . CONGESTIVE HEART FAILURE 12/09/2008  . CORONARY ARTERY DISEASE 2006  . HYPERLIPIDEMIA 08/09/2006  . HYPERTENSION 08/09/2006  . MYOCARDIAL INFARCTION, HX OF 07/07/2004  . 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 OBESITY 07/19/2008    Past Surgical History:  Procedure Laterality Date  . CARDIOVERSION  N/A 12/14/2017   Procedure: CARDIOVERSION;  Surgeon: [name], MD;  Location: [place];  Service: Cardiovascular;  Laterality: N/A;  . COLONOSCOPY    . CORONARY ARTERY BYPASS GRAFT  2006    Current Medications: Outpatient Medications Prior to Visit  Medication Sig Dispense Refill  . acetaminophen (TYLENOL) 500 MG tablet Take 1,000 mg by mouth daily as needed for moderate pain or headache.    Marland Kitchen atorvastatin (LIPITOR) 80 MG tablet TAKE 1 TABLET BY MOUTH EVERY DAY 30 tablet 1  . ELIQUIS 5 MG TABS tablet TAKE 1 TABLET BY MOUTH TWICE A DAY 60 tablet 6  . furosemide (LASIX) 20 MG tablet TAKE 1 TABLET BY MOUTH EVERY DAY 30 tablet 11  . KLOR-CON M20 20 MEQ tablet TAKE 1 TABLET BY MOUTH EVERY DAY 30 tablet 11  . metoprolol succinate (TOPROL-XL) 25 MG 24 hr tablet TAKE 1 TABLET BY MOUTH EVERY DAY WITH OR IMMEDIATELY FOLLOWING A MEAL 30 tablet 11  . tamsulosin (FLOMAX) 0.4 MG CAPS capsule TAKE ONE CAPSULE BY MOUTH EVERY DAY AFTER SUPPER 30 capsule 1   No facility-administered medications prior to visit.     Allergies:   Patient has no known allergies.   Social History   Socioeconomic History  . Marital status: Single    Spouse name: Not on file  . Number of children: Not on file  . Years of education: Not on file  . Highest education level: Not on file  Occupational History  . Not on file  Tobacco Use  . Smoking status: Never Smoker  . Smokeless tobacco: Never Used  Substance and Sexual Activity  . Alcohol use: Yes    Alcohol/week: 1.0 standard drink    Types: 1 Cans of beer per week    Comment: occasional  . Drug use: No  . Sexual activity: Not on file  Other Topics Concern  . Not on file  Social History Narrative  . Not on file   Social Determinants of Health   Financial Resource Strain:   . Difficulty of Paying Living Expenses:   Food Insecurity:   . Worried About Charity fundraiser in the Last Year:   . Arboriculturist in the Last Year:   Transportation  Needs:   . Film/video editor (Medical):   Marland Kitchen Lack of Transportation (Non-Medical):   Physical Activity:   . Days of Exercise per Week:   . Minutes of Exercise per Session:   Stress:   . Feeling of Stress :   Social Connections:   . Frequency of Communication with Friends and Family:   . Frequency of Social Gatherings with Friends and Family:   . Attends Religious Services:   . Active Member of Clubs or Organizations:   . Attends Club or  Organization Meetings:   Marland Kitchen Marital Status:      Social history is notable in that he was born in [place].  He is single.  However he has been living with the same woman for the last 36 years.  They do not have children.  He works for [employer] as a Associate Professor.  Family History:  The patient's family history includes Bipolar disorder in his brother; Heart disease in his father; Hyperlipidemia in his mother; Hypertension in his mother; Ulcers in his father.   ROS General: Negative; No fevers, chills, or night sweats;  HEENT: Negative; No changes in vision or hearing, sinus congestion, difficulty swallowing Pulmonary: Negative; No cough, wheezing, shortness of breath, hemoptysis Cardiovascular: History of CAD, status post CABG.  History of PAF/a flutter Intermittent ankle swelling GI: Negative; No nausea, vomiting, diarrhea, or abdominal pain GU: Negative; No dysuria, hematuria, or difficulty voiding Musculoskeletal: Negative; no myalgias, joint pain, or weakness Hematologic/Oncology: Negative; no easy bruising, bleeding Endocrine: Negative; no heat/cold intolerance; no diabetes Neuro: Negative; no changes in balance, headaches Skin: Negative; No rashes or skin lesions Psychiatric: Negative; No behavioral problems, depression Sleep: Positive for snoring, nonrestorative sleep.  He had initially felt significantly better with CPAP therapy but essentially has not used it over the past year. No bruxism, restless legs, hypnogognic  hallucinations, no cataplexy   An Epworth sleepiness scale was calculated in the office today and this endorsed as 5 as shown below:.   PHYSICAL EXAM:   VS:  BP 130/78   Pulse 67   Temp 98.3 F (36.8 C)   Ht _0  (1.676 m)   Wt 236 lb 9.6 oz (107.3 kg)   SpO2 97%   BMI 38.19 kg/m     Repeat blood pressure by me was 132/70  Wt Readings from Last 3 Encounters:  07/26/19 236 lb 9.6 oz (107.3 kg)  07/23/19 235 lb 12.8 oz (107 kg)  01/12/19 232 lb (105.2 kg)    General: Alert, oriented, no distress.  Skin: normal turgor, no rashes, warm and dry HEENT: Normocephalic, atraumatic. Pupils equal round and reactive to light; sclera anicteric; extraocular muscles intact;  Nose without nasal septal hypertrophy Mouth/Parynx benign; Mallinpatti scale 3 Neck: No JVD, no carotid bruits; normal carotid upstroke Lungs: clear to ausculatation and percussion; no wheezing or rales Chest wall: without tenderness to palpitation Heart: PMI not displaced, RRR, s1 s2 normal, 2/6 harsh left ear systolic murmur, no diastolic murmur, no rubs, gallops, thrills, or heaves Abdomen: soft, nontender; no hepatosplenomehaly, BS+; abdominal aorta nontender and not dilated by palpation. Back: no CVA tenderness Pulses 2+ Musculoskeletal: full range of motion, normal strength, no joint deformities Extremities: 2+ pitting ankle edema bilaterally;  no clubbing cyanosis , Homan's sign negative  Neurologic: grossly nonfocal; Cranial nerves grossly wnl Psychologic: Normal mood and affect   Studies/Labs Reviewed:   EKG:  EKG is ordered today.  ECG (independently read by me): Sinus rhythm at 72 bpm.  First-degree AV block.  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  Recent Labs: BMP Latest Ref Rng & Units 07/23/2019 11/24/2018 02/13/2018  Glucose 65 - 99 mg/dL 135(H) 121(H) 110(H)  BUN 8 - 27 mg/dL _1 Creatinine 0.76 - 1.27 mg/dL 0.77 0.83 0.76  BUN/Creat Ratio 10 - _2 Sodium 134 - 144 mmol/L 139 138 142  Potassium 3.5 - 5.2 mmol/L 4.5 4.3 4.4  Chloride 96 - 106 mmol/L 104 105 102  CO2 20 - 29 mmol/L _0 Calcium 8.6 - 10.2 mg/dL 9.0 9.2 9.6     Hepatic Function Latest Ref Rng & Units 07/23/2019 11/24/2018 07/04/2017  Total Protein 6.0 - 8.5 g/dL 6.7 7.0 6.6  Albumin 3.8 - 4.8 g/dL 3.5(L) 3.6(L) 3.6  AST 0 - 40 IU/L 58(H) 50(H) 38(H)  ALT 0 - 44 IU/L 37 30 30  Alk Phosphatase 48 - 121 IU/L 79 74 76  Total Bilirubin 0.0 - 1.2 mg/dL 1.4(H) 1.3(H) 1.1  Bilirubin, Direct 0.0 - 0.3 mg/dL - - -    CBC Latest Ref Rng & Units 07/23/2019 11/24/2018 12/14/2017  WBC 3.4 - 10.8 x10E3/uL 3.9 5.0 -  Hemoglobin 13.0 - 17.7 g/dL 12.1(L) 13.3 12.6(L)  Hematocrit 37.5 - 51.0 % 35.7(L) 40.4 37.0(L)  Platelets 150 - 450 x10E3/uL 82(LL) 90(LL) -   Lab Results  Component Value Date   MCV 91 07/23/2019   MCV 95 11/24/2018   MCV 92 09/05/2017   Lab Results  Component Value Date   TSH 2.53 11/29/2018   Lab Results  Component Value Date   HGBA1C 6.0 11/29/2018     BNP    Component Value Date/Time   BNP 204.2 (H) 09/05/2017 1508    ProBNP    Component Value Date/Time   PROBNP 36.0 12/03/2008 1631     Lipid Panel     Component Value Date/Time   CHOL 111 07/23/2019 1144   TRIG 53 07/23/2019 1144   HDL 48 07/23/2019 1144   CHOLHDL 2.3 07/23/2019 1144   CHOLHDL 4 07/04/2017 1535   VLDL 24.2 07/04/2017 1535   LDLCALC 51 07/23/2019 1144   LDLDIRECT 198.2 07/11/2009 0925     RADIOLOGY: No results found.   Additional studies/ records that were reviewed today include:  I reviewed the records of Dr. Stanford Breed, Hershal Coria, split-night CPAP titration trial, laboratory, and obtained a download from January 13 through March 14, 2018.  ASSESSMENT:    1. OSA (obstructive sleep apnea)   2. Coronary artery disease involving native coronary artery of native heart without angina pectoris   3. Hx of CABG   4. Essential  hypertension   5. Aortic heart murmur   6. Leg edema    PLAN:  [name] is a 69 year old gentleman who has a significant cardiac history and is status post CAD with CABG revascularization surgery in 2006.  He has a history of mild chronic combined CHF with EF of 45 to 50%  developed atrial fibrillation and subsequent atrial flutter requiring DC cardioversion.  Historically he admitted to snoring, nonrestorative sleep, and due to his cardiovascular comorbidities and symptoms a sleep study was performed which revealed severe sleep apnea with an AHI of 35.0, with events being more severe during REM sleep with an AHI of 41.9.  When I saw him for his initial sleep evaluation in February 2020 he was meeting Medicare compliance standards however his sleep duration was inadequate with only 4 hours and 52 minutes of CPAP use even though he was meeting compliance.  I spent considerable time with him at that time and reviewed with him the importance of using CPAP for the nights entirety with ideal sleep duration with CPAP at least 7 to 8 hours.  Apparently over the past year he began to fall asleep on a recliner and essentially stop using CPAP. He has more weepiness today compared to 1 year ago.  There is recurrence of snoring, frequent awakenings and nonrestorative sleep.  He has the original [employer] Simplus medium mask that he was given at his titration.  I spent considerable time again trying to reeducate him of the benefits of attempting to reinstitute therapy.  I again discussed the increased recurrent risk of atrial fibrillation or flutter if left untreated as well as effects on hypertension.  He has known CAD and nocturnal ischemia hypoxemia can be associated with significant events of nocturnal ischemia in future MI.  He seems interested to reinstitute therapy.  On exam, he does have increased leg swelling and I have recommended that he slightly increase his furosemide to 40 mg for the next 3  days which should be of benefit and then resume his previous 20 mg dose but he can take an extra 20 mg on a as needed basis.  Compression stockings were recommended.  He continues to be on Eliquis anticoagulation is without bleeding.  Blood pressure is stable on metoprolol succinate 25 mg.  He is on atorvastatin 80 mg for hyperlipidemia.  Time spent: 40 minutes  Medication Adjustments/Labs and Tests Ordered: Current medicines are reviewed at length with the patient today.  Concerns regarding medicines are outlined above.  Medication changes, Labs and Tests ordered today are listed in the Patient Instructions below. Patient Instructions  Medication Instructions:  FOR THE NEXT 3 DAYS INCREASE YOUR LASIX TO 40MG, AFTER THIS, GO BACK TO THE 20MG DAILY AND YOU CAN TAKE AN EXTRA DOSE AS NEEDED FOR SWELLING.  *If you need a refill on your cardiac medications before your next appointment, please call your pharmacy*   Follow-Up: At [facility], you and your health needs are our priority.  As part of our continuing mission to provide you with exceptional heart care, we have created designated Provider Care Teams.  These Care Teams include your primary Cardiologist (physician) and Advanced Practice Providers (APPs -  Physician Assistants and Nurse Practitioners) who all work together to provide you with the care you need, when you need it.  We recommend signing up for the patient portal called "MyChart".  Sign up information is provided on this After Visit Summary.  MyChart is used to connect with patients for Virtual Visits (Telemedicine).  Patients are able to view lab/test results, encounter notes, upcoming appointments, etc.  Non-urgent messages can be sent to your provider as well.   To learn more about what you can do with MyChart, go to [email or web address].    Your next appointment:   3 month(s)  The format for your next appointment:   In Person  Provider:   Shelva Majestic,  MD  COMPRESSION STOCKINGS       Signed, Shelva Majestic, MD  08/01/2019 4:37 PM    [place] [address] [place], [place], [place]  [address] Phone: [phone]

## 2019-07-26 NOTE — Patient Instructions (Addendum)
Medication Instructions:  FOR THE NEXT 3 DAYS INCREASE YOUR LASIX TO 40MG , AFTER THIS, GO BACK TO THE 20MG  DAILY AND YOU CAN TAKE AN EXTRA DOSE AS NEEDED FOR SWELLING.  *If you need a refill on your cardiac medications before your next appointment, please call your pharmacy*   Follow-Up: At [facility], you and your health needs are our priority.  As part of our continuing mission to provide you with exceptional heart care, we have created designated Provider Care Teams.  These Care Teams include your primary Cardiologist (physician) and Advanced Practice Providers (APPs -  Physician Assistants and Nurse Practitioners) who all work together to provide you with the care you need, when you need it.  We recommend signing up for the patient portal called "MyChart".  Sign up information is provided on this After Visit Summary.  MyChart is used to connect with patients for Virtual Visits (Telemedicine).  Patients are able to view lab/test results, encounter notes, upcoming appointments, etc.  Non-urgent messages can be sent to your provider as well.   To learn more about what you can do with MyChart, go to [email or web address].    Your next appointment:   3 month(s)  The format for your next appointment:   In Person  Provider:   Shelva Majestic, MD  COMPRESSION STOCKINGS

## 2019-08-09 ENCOUNTER — Ambulatory Visit: Payer: 59 | Admitting: Internal Medicine

## 2019-08-24 ENCOUNTER — Telehealth (INDEPENDENT_AMBULATORY_CARE_PROVIDER_SITE_OTHER): Payer: 59 | Admitting: Family Medicine

## 2019-08-24 ENCOUNTER — Encounter: Payer: Self-pay | Admitting: Family Medicine

## 2019-08-24 ENCOUNTER — Telehealth: Payer: Self-pay | Admitting: Internal Medicine

## 2019-08-24 VITALS — Temp 98.4°F | Ht 66.0 in | Wt 230.0 lb

## 2019-08-24 DIAGNOSIS — L03116 Cellulitis of left lower limb: Secondary | ICD-10-CM

## 2019-08-24 MED ORDER — AMOXICILLIN-POT CLAVULANATE 875-125 MG PO TABS
1.0000 | ORAL_TABLET | Freq: Two times a day (BID) | ORAL | 0 refills | Status: DC
Start: 2019-08-24 — End: 2019-08-31

## 2019-08-24 NOTE — Telephone Encounter (Signed)
Error

## 2019-08-24 NOTE — Progress Notes (Signed)
Subjective:    Patient ID: [name], male    DOB: 1950/04/19, 69 y.o.   MRN: [id number]  HPI Virtual Visit via Video Note  I connected with the patient on 08/24/19 at  2:00 PM EDT by a video enabled telemedicine application and verified that I am speaking with the correct person using two identifiers.  Location patient: home Location provider:work or home office Persons participating in the virtual visit: patient, provider  I discussed the limitations of evaluation and management by telemedicine and the availability of in person appointments. The patient expressed understanding and agreed to proceed.   HPI: Here for apparent cellulitis on the left lower leg. He gets this about every 3-5 years. For several days this area has been red, warm, swollen, and mildly tender. No fever. No blisters or rash.    ROS: See pertinent positives and negatives per HPI.  Past Medical History:  Diagnosis Date  . Adjustment disorder with depressed mood 09/07/2007  . Colon polyps    Tubular Adenoma 2010  . CONGESTIVE HEART FAILURE 12/09/2008  . CORONARY ARTERY DISEASE 2006  . HYPERLIPIDEMIA 08/09/2006  . HYPERTENSION 08/09/2006  . MYOCARDIAL INFARCTION, HX OF 07/07/2004  . 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 OBESITY 07/19/2008    Past Surgical History:  Procedure Laterality Date  . CARDIOVERSION N/A 12/14/2017   Procedure: CARDIOVERSION;  Surgeon: [name], MD;  Location: [facility];  Service: Cardiovascular;  Laterality: N/A;  . COLONOSCOPY    . CORONARY ARTERY BYPASS GRAFT  2006    Family History  Problem Relation Age of Onset  . Hypertension Mother   . Hyperlipidemia Mother   . Heart disease Father   . Ulcers Father   . Bipolar disorder Brother   . Colon cancer Neg Hx      Current Outpatient Medications:  .  acetaminophen (TYLENOL) 500 MG tablet, Take 1,000 mg by mouth daily as needed for moderate pain or headache., Disp: , Rfl:  .  atorvastatin (LIPITOR) 80 MG  tablet, TAKE 1 TABLET BY MOUTH EVERY DAY, Disp: 30 tablet, Rfl: 1 .  ELIQUIS 5 MG TABS tablet, TAKE 1 TABLET BY MOUTH TWICE A DAY, Disp: 60 tablet, Rfl: 6 .  furosemide (LASIX) 20 MG tablet, TAKE 1 TABLET BY MOUTH EVERY DAY, Disp: 30 tablet, Rfl: 11 .  KLOR-CON M20 20 MEQ tablet, TAKE 1 TABLET BY MOUTH EVERY DAY, Disp: 30 tablet, Rfl: 11 .  metoprolol succinate (TOPROL-XL) 25 MG 24 hr tablet, TAKE 1 TABLET BY MOUTH EVERY DAY WITH OR IMMEDIATELY FOLLOWING A MEAL, Disp: 30 tablet, Rfl: 11 .  tamsulosin (FLOMAX) 0.4 MG CAPS capsule, TAKE ONE CAPSULE BY MOUTH EVERY DAY AFTER SUPPER, Disp: 30 capsule, Rfl: 1 .  amoxicillin-clavulanate (AUGMENTIN) 875-125 MG tablet, Take 1 tablet by mouth 2 (two) times daily., Disp: 20 tablet, Rfl: 0  EXAM:  VITALS per patient if applicable:  GENERAL: alert, oriented, appears well and in no acute distress  HEENT: atraumatic, conjunttiva clear, no obvious abnormalities on inspection of external nose and ears  NECK: normal movements of the head and neck  LUNGS: on inspection no signs of respiratory distress, breathing rate appears normal, no obvious gross SOB, gasping or wheezing  CV: no obvious cyanosis  MS: moves all visible extremities without noticeable abnormality  PSYCH/NEURO: pleasant and cooperative, no obvious depression or anxiety, speech and thought processing grossly intact  ASSESSMENT AND PLAN: Cellulitis, treat with Augmentin. Recheck prn. Alysia Penna, MD  Discussed the following assessment and  plan:  No diagnosis found.     I discussed the assessment and treatment plan with the patient. The patient was provided an opportunity to ask questions and all were answered. The patient agreed with the plan and demonstrated an understanding of the instructions.   The patient was advised to call back or seek an in-person evaluation if the symptoms worsen or if the condition fails to improve as anticipated.     Review of Systems       Objective:   Physical Exam        Assessment & Plan:

## 2019-08-31 ENCOUNTER — Ambulatory Visit (HOSPITAL_COMMUNITY)
Admission: RE | Admit: 2019-08-31 | Discharge: 2019-08-31 | Disposition: A | Discharge status: Home / Self Care | Payer: 59 | Source: Ambulatory Visit | Attending: Internal Medicine | Admitting: Internal Medicine

## 2019-08-31 ENCOUNTER — Other Ambulatory Visit: Payer: Self-pay

## 2019-08-31 ENCOUNTER — Encounter: Payer: Self-pay | Admitting: Internal Medicine

## 2019-08-31 ENCOUNTER — Ambulatory Visit: Payer: 59 | Admitting: Internal Medicine

## 2019-08-31 VITALS — BP 130/70 | HR 47 | Temp 98.5°F | Wt 245.5 lb

## 2019-08-31 DIAGNOSIS — L03116 Cellulitis of left lower limb: Secondary | ICD-10-CM

## 2019-08-31 MED ORDER — AMOXICILLIN-POT CLAVULANATE 875-125 MG PO TABS: 1.0000 | ORAL_TABLET | Freq: Two times a day (BID) | ORAL | 0 refills | Status: AC

## 2019-08-31 NOTE — Progress Notes (Signed)
Acute Office Visit     This visit occurred during the SARS-CoV-2 public health emergency.  Safety protocols were in place, including screening questions prior to the visit, additional usage of staff PPE, and extensive cleaning of exam room while observing appropriate contact time as indicated for disinfecting solutions.    CC/Reason for Visit: Left lower leg pain and swelling  HPI: [name] is a 69 y.o. male who is coming in today for the above mentioned reasons.  This began about 10 days ago.  Last week he had a visit with another provider who thought he had cellulitis and prescribed 10 days of Augmentin.  He states the pain, erythema and edema has gotten better but is still present.  Pain is worse around the ankle, he does have significant calf pain.  There is erythema around the dorsum of the foot extending above the ankle, he states that is better since the antibiotics as it used to be up to around his mid shin.  He has not had any fevers or systemic symptoms.  He has a history of atrial fibrillation and is anticoagulated on Eliquis which he is compliant with.   Past Medical/Surgical History: Past Medical History:  Diagnosis Date  . Adjustment disorder with depressed mood 09/07/2007  . Colon polyps    Tubular Adenoma 2010  . CONGESTIVE HEART FAILURE 12/09/2008  . CORONARY ARTERY DISEASE 2006  . HYPERLIPIDEMIA 08/09/2006  . HYPERTENSION 08/09/2006  . MYOCARDIAL INFARCTION, HX OF 07/07/2004  . 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 OBESITY 07/19/2008    Past Surgical History:  Procedure Laterality Date  . CARDIOVERSION N/A 12/14/2017   Procedure: CARDIOVERSION;  Surgeon: [name], MD;  Location: [facility];  Service: Cardiovascular;  Laterality: N/A;  . COLONOSCOPY    . CORONARY ARTERY BYPASS GRAFT  2006    Social History:  reports that he has never smoked. He has never used smokeless tobacco. He reports current alcohol use of about 1.0 standard drink of  alcohol per week. He reports that he does not use drugs.  Allergies: No Known Allergies  Family History:  Family History  Problem Relation Age of Onset  . Hypertension Mother   . Hyperlipidemia Mother   . Heart disease Father   . Ulcers Father   . Bipolar disorder Brother   . Colon cancer Neg Hx      Current Outpatient Medications:  .  acetaminophen (TYLENOL) 500 MG tablet, Take 1,000 mg by mouth daily as needed for moderate pain or headache., Disp: , Rfl:  .  amoxicillin-clavulanate (AUGMENTIN) 875-125 MG tablet, Take 1 tablet by mouth 2 (two) times daily for 5 days., Disp: 10 tablet, Rfl: 0 .  atorvastatin (LIPITOR) 80 MG tablet, TAKE 1 TABLET BY MOUTH EVERY DAY, Disp: 30 tablet, Rfl: 1 .  ELIQUIS 5 MG TABS tablet, TAKE 1 TABLET BY MOUTH TWICE A DAY, Disp: 60 tablet, Rfl: 6 .  furosemide (LASIX) 20 MG tablet, TAKE 1 TABLET BY MOUTH EVERY DAY, Disp: 30 tablet, Rfl: 11 .  KLOR-CON M20 20 MEQ tablet, TAKE 1 TABLET BY MOUTH EVERY DAY, Disp: 30 tablet, Rfl: 11 .  metoprolol succinate (TOPROL-XL) 25 MG 24 hr tablet, TAKE 1 TABLET BY MOUTH EVERY DAY WITH OR IMMEDIATELY FOLLOWING A MEAL, Disp: 30 tablet, Rfl: 11 .  tamsulosin (FLOMAX) 0.4 MG CAPS capsule, TAKE ONE CAPSULE BY MOUTH EVERY DAY AFTER SUPPER, Disp: 30 capsule, Rfl: 1  Review of Systems:  Constitutional: Denies fever, chills, diaphoresis,  appetite change and fatigue.  HEENT: Denies photophobia, eye pain, redness, hearing loss, ear pain, congestion, sore throat, rhinorrhea, sneezing, mouth sores, trouble swallowing, neck pain, neck stiffness and tinnitus.   Respiratory: Denies SOB, DOE, cough, chest tightness,  and wheezing.   Cardiovascular: Denies chest pain, palpitations and leg swelling.  Gastrointestinal: Denies nausea, vomiting, abdominal pain, diarrhea, constipation, blood in stool and abdominal distention.  Genitourinary: Denies dysuria, urgency, frequency, hematuria, flank pain and difficulty urinating.  Endocrine:  Denies: hot or cold intolerance, sweats, changes in hair or nails, polyuria, polydipsia. Musculoskeletal: Denies myalgias, back pain, joint swelling, arthralgias and gait problem.  Neurological: Denies dizziness, seizures, syncope, weakness, light-headedness, numbness and headaches.  Hematological: Denies adenopathy. Easy bruising, personal or family bleeding history  Psychiatric/Behavioral: Denies suicidal ideation, mood changes, confusion, nervousness, sleep disturbance and agitation    Physical Exam: Vitals:   08/31/19 0740  BP: (!) 130/70  Pulse: 47  Temp: 98.5 F (36.9 C)  TempSrc: Oral  SpO2: 96%  Weight: (!) 245 lb 8 oz (111.4 kg)    Body mass index is 39.62 kg/m.   Constitutional: NAD, calm, comfortable Eyes: PERRL, lids and conjunctivae normal, wears corrective lenses ENMT: Mucous membranes are moist.  Musculoskeletal: 2+ pitting edema bilaterally, much worse on the left where the edema is tense, there is erythema surrounding the dorsum of his foot up to around the ankle area, he does have a positive Homans' sign Neurologic: Grossly intact and nonfocal Psychiatric: Normal judgment and insight. Alert and oriented x 3. Normal mood.    Impression and Plan:  Cellulitis of left leg  -I do believe cellulitis remains the most likely diagnosis here, the fact that he has somewhat improved on antibiotics gives credit to this. -Given his tense edema and significant calf pain, I think we should rule out lower extremity DVT with a Doppler although this is much less likely since he is fully anticoagulated. -I will go ahead and add an additional 5 days of Augmentin to complete 14 days as he seems to have had improvement with this. -Return to clinic in 10 to 14 days if no improvement.    Patient Instructions  -Nice seeing you today!!  -Will send you for an ultrasound of your leg today to make sure there is no blood clot.  -An additional 5 days of augmentin have been  prescribed.  -Call back in 14 days if no improvement.     Lelon Frohlich, MD [facility]

## 2019-08-31 NOTE — Patient Instructions (Addendum)
-  Nice seeing you today!!  -Will send you for an ultrasound of your leg today to make sure there is no blood clot.  -An additional 5 days of augmentin have been prescribed.  -Call back in 14 days if no improvement.  Left lower leg pain and swelling

## 2019-09-07 ENCOUNTER — Other Ambulatory Visit: Payer: Self-pay

## 2019-09-07 ENCOUNTER — Telehealth: Payer: Self-pay | Admitting: Internal Medicine

## 2019-09-07 MED ORDER — AMOXICILLIN-POT CLAVULANATE 875-125 MG PO TABS
1.0000 | ORAL_TABLET | Freq: Two times a day (BID) | ORAL | 0 refills | Status: DC
Start: 2019-09-07 — End: 2019-09-21

## 2019-09-07 NOTE — Telephone Encounter (Signed)
Ok to refill for 5 more days (#10 tabs). If still not improved. Will need OV to assess.  [place]

## 2019-09-07 NOTE — Telephone Encounter (Signed)
Please see message. °

## 2019-09-07 NOTE — Telephone Encounter (Signed)
Called patient and NOT able to LMOVM to return call  Sent in 5 more days to his pharmacy.

## 2019-09-07 NOTE — Telephone Encounter (Signed)
Pt call and want to know if he can get a refill for 5 more days of  amoxicillin-clavulanate (AUGMENTIN) 875-125 MG tablet sent to  [id number] - [place],  - [address] Phone:  [phone]  Fax:  [phone]     he stated that is is some better but feel like he need 5 more days.

## 2019-09-17 ENCOUNTER — Telehealth: Payer: Self-pay | Admitting: Cardiology

## 2019-09-17 NOTE — Telephone Encounter (Addendum)
Pt c/o swelling: STAT is pt has developed SOB within 24 hours  1) How much weight have you gained and in what time span? 10 or 12 lbs in 3 weeks  2) If swelling, where is the swelling located? Legs and ankles  3) Are you currently taking a fluid pill? yes  4) Are you currently SOB? no  5) Do you have a log of your daily weights (if so, list)? In 4 weeks averaged gaining 3 or 4 lbs per week  6) Have you gained 3 pounds in a day or 5 pounds in a week? 5 lbs in a week  7) Have you traveled recently? No   Patient states for the past 3 weeks he has been having swelling in his legs and ankles. He states he gets some SOB when laying down, but was not currently SOB. He states he also noticed on his Worthy Rancher that he has been back in afib. He states he is not having symptoms now and his BP is okay. He states he gets some dizziness periodically, but did not have it currently and he has a history of afib. He states his legs also feel hot. He is scheduled 09/21/2019, but believes he may need to be seen sooner. Please advise.

## 2019-09-17 NOTE — Telephone Encounter (Signed)
Increase lasix to 40 mg BID for 3 days, then 40 mg thereafter; Bmet 3 days; APP ov Kirk Ruths

## 2019-09-17 NOTE — Telephone Encounter (Signed)
Pt called to report that over the past few weeks he has been having increasing edema in both of his legs.. he has had some heat and pain and has a history of cellulitis.. he saw his PCP 08/31/19 Dr. Jerilee Hoh and she treated him with Amoxicillin and he says the pain and heat has improved.   He is reporting that he has been SOB when laying flat at night.. he has had to prop up his head to sleep... he denies palpitations but thinks he is having AFIB on his Chad... his BP today is 116/70 and HR 70's.   He has gained about 5 lbs in the week prior....  His swelling was up to his knees.... he had doubled his lasix to 40 mg qd for the past 7 days and has had some improvement with his breathing and some relief of the edema but they are still swollen... he also added an extra K with the lasix.   I have advised him to continue to monitor, to elevate his extremities and to decrease his salt intake. He will keep track of his BP and HR.   I will forward to Dr. Stanford Breed for his review... the pt does have an appt with him 09/21/19.

## 2019-09-18 MED ORDER — FUROSEMIDE 20 MG PO TABS: 40.0000 mg | ORAL_TABLET | Freq: Every day | ORAL | 3 refills | Status: DC

## 2019-09-18 NOTE — Telephone Encounter (Signed)
Spoke with pt, Aware of dr Jacalyn Lefevre recommendations. New script sent to the pharmacy and Follow up scheduled

## 2019-09-20 ENCOUNTER — Other Ambulatory Visit: Payer: Self-pay | Admitting: Internal Medicine

## 2019-09-21 ENCOUNTER — Encounter: Payer: Self-pay | Admitting: Cardiology

## 2019-09-21 ENCOUNTER — Ambulatory Visit: Payer: 59 | Admitting: Cardiology

## 2019-09-21 ENCOUNTER — Other Ambulatory Visit: Payer: Self-pay

## 2019-09-21 VITALS — BP 146/62 | HR 72 | Ht 66.0 in | Wt 240.0 lb

## 2019-09-21 DIAGNOSIS — I484 Atypical atrial flutter: Secondary | ICD-10-CM | POA: Diagnosis not present

## 2019-09-21 DIAGNOSIS — I10 Essential (primary) hypertension: Secondary | ICD-10-CM | POA: Diagnosis not present

## 2019-09-21 DIAGNOSIS — Z951 Presence of aortocoronary bypass graft: Secondary | ICD-10-CM | POA: Diagnosis not present

## 2019-09-21 DIAGNOSIS — I2510 Atherosclerotic heart disease of native coronary artery without angina pectoris: Secondary | ICD-10-CM

## 2019-09-21 LAB — BASIC METABOLIC PANEL
BUN/Creatinine Ratio: 16 (ref 10–24)
BUN: 13 mg/dL (ref 8–27)
CO2: 25 mmol/L (ref 20–29)
Calcium: 9.6 mg/dL (ref 8.6–10.2)
Chloride: 105 mmol/L (ref 96–106)
Creatinine, Ser: 0.82 mg/dL (ref 0.76–1.27)
GFR calc Af Amer: 104 mL/min/{1.73_m2} (ref >59)
GFR calc non Af Amer: 90 mL/min/{1.73_m2} (ref >59)
Glucose: 115 mg/dL — ABNORMAL HIGH (ref 65–99)
Potassium: 5.1 mmol/L (ref 3.5–5.2)
Sodium: 142 mmol/L (ref 134–144)

## 2019-09-21 NOTE — H&P (View-Only) (Signed)
HPI:  FU CAD, ASand atrial flutter/fibrillation; history of coronary artery disease, status post coronary artery bypass graft surgery performed in June 2006. Patient had a LIMA to the LAD, saphenous vein graft to the diagonal, saphenous vein graft to the acute marginal and saphenous vein graft to the PDA. Carotid Dopplers August 2015 showed no significant stenosis. Nuclear study August 2015 showed ejection fraction 55% but no ischemia or infarction. Possible mild decrease in systolic blood pressure at peak exercise.Abdominal ultrasound June 2017 showed no aneurysm.Patient seen8/19with newly diagnosed atrial fibrillation. He was volume overloaded and was diuresed with Lasix.Had DCCV of atypical atrial flutter 11/19.Echocardiogram May 2021 showed normal LV function, grade 3 DD, moderate biatrial enlargement, mild to moderate aortic stenosis withmean gradient 18 mmHg. patient recently contacted the office with complaints of worsening edema and dyspnea.  He was concerned he might have developed recurrent atrial arrhythmias.  He was added to my schedule today.  Note his Lasix was doubled for 3 days.  Since last seenpatient notes increased lower extremity edema over the past 4 weeks and increased dyspnea on exertion.  No chest pain or palpitations.  He has taken additional Lasix with some improvement.  Current Outpatient Medications  Medication Sig Dispense Refill  . acetaminophen (TYLENOL) 500 MG tablet Take 1,000 mg by mouth daily as needed for moderate pain or headache.    Marland Kitchen atorvastatin (LIPITOR) 80 MG tablet TAKE 1 TABLET BY MOUTH EVERY DAY 90 tablet 1  . ELIQUIS 5 MG TABS tablet TAKE 1 TABLET BY MOUTH TWICE A DAY 60 tablet 6  . furosemide (LASIX) 20 MG tablet Take 2 tablets (40 mg total) by mouth daily. 180 tablet 3  . KLOR-CON M20 20 MEQ tablet TAKE 1 TABLET BY MOUTH EVERY DAY 30 tablet 11  . metoprolol succinate (TOPROL-XL) 25 MG 24 hr tablet TAKE 1 TABLET BY MOUTH EVERY DAY WITH OR  IMMEDIATELY FOLLOWING A MEAL 30 tablet 11  . tamsulosin (FLOMAX) 0.4 MG CAPS capsule TAKE ONE CAPSULE BY MOUTH EVERY DAY AFTER SUPPER 90 capsule 1   No current facility-administered medications for this visit.     Past Medical History:  Diagnosis Date  . Adjustment disorder with depressed mood 09/07/2007  . Colon polyps    Tubular Adenoma 2010  . CONGESTIVE HEART FAILURE 12/09/2008  . CORONARY ARTERY DISEASE 2006  . HYPERLIPIDEMIA 08/09/2006  . HYPERTENSION 08/09/2006  . MYOCARDIAL INFARCTION, HX OF 07/07/2004  . 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 OBESITY 07/19/2008    Past Surgical History:  Procedure Laterality Date  . CARDIOVERSION N/A 12/14/2017   Procedure: CARDIOVERSION;  Surgeon: [name], MD;  Location: [facility];  Service: Cardiovascular;  Laterality: N/A;  . COLONOSCOPY    . CORONARY ARTERY BYPASS GRAFT  2006    Social History   Socioeconomic History  . Marital status: Single    Spouse name: Not on file  . Number of children: Not on file  . Years of education: Not on file  . Highest education level: Not on file  Occupational History  . Not on file  Tobacco Use  . Smoking status: Never Smoker  . Smokeless tobacco: Never Used  Substance and Sexual Activity  . Alcohol use: Yes    Alcohol/week: 1.0 standard drink    Types: 1 Cans of beer per week    Comment: occasional  . Drug use: No  . Sexual activity: Not on file  Other Topics Concern  . Not on file  Social  History Narrative  . Not on file   Social Determinants of Health   Financial Resource Strain:   . Difficulty of Paying Living Expenses: Not on file  Food Insecurity:   . Worried About Charity fundraiser in the Last Year: Not on file  . Ran Out of Food in the Last Year: Not on file  Transportation Needs:   . Lack of Transportation (Medical): Not on file  . Lack of Transportation (Non-Medical): Not on file  Physical Activity:   . Days of Exercise per Week: Not on file  . Minutes of  Exercise per Session: Not on file  Stress:   . Feeling of Stress : Not on file  Social Connections:   . Frequency of Communication with Friends and Family: Not on file  . Frequency of Social Gatherings with Friends and Family: Not on file  . Attends Religious Services: Not on file  . Active Member of Clubs or Organizations: Not on file  . Attends Archivist Meetings: Not on file  . Marital Status: Not on file  Intimate Partner Violence:   . Fear of Current or Ex-Partner: Not on file  . Emotionally Abused: Not on file  . Physically Abused: Not on file  . Sexually Abused: Not on file    Family History  Problem Relation Age of Onset  . Hypertension Mother   . Hyperlipidemia Mother   . Heart disease Father   . Ulcers Father   . Bipolar disorder Brother   . Colon cancer Neg Hx     ROS: no fevers or chills, productive cough, hemoptysis, dysphasia, odynophagia, melena, hematochezia, dysuria, hematuria, rash, seizure activity, orthopnea, PND, pedal edema, claudication. Remaining systems are negative.  Physical Exam: Well-developed well-nourished in no acute distress.  Skin is warm and dry.  HEENT is normal.  Neck is supple.  Chest is clear to auscultation with normal expansion.  Cardiovascular exam is irregular, 3/6 systolic murmur left sternal border. Abdominal exam nontender or distended. No masses palpated. Extremities show 1+ ankle edema. neuro grossly intact  ECG-atrial flutter at a rate of 72, left anterior fascicular block, right bundle branch block, left ventricular hypertrophy, cannot rule out septal infarct.  Personally reviewed  A/P  1 paroxysmal atrial fibrillation-patient has developed recurrent atrial flutter.  He is symptomatic with worsening dyspnea on exertion and bilateral lower extremity edema.  He has been compliant with apixaban.  We will plan to proceed with elective cardioversion.  This is his second episode of atrial arrhythmias.  He is very  symptomatic with these bouts.  I will therefore have him seen in atrial fibrillation clinic for consideration of antiarrhythmic such as Tikosyn.  2 mild to moderate aortic stenosis-patient will need follow-up echocardiogram May 2022.  3 coronary artery disease-patient denies chest pain.  Plan to continue statin.  He is not on aspirin given need for apixaban.  4 hypertension-patient's blood pressure is controlled.  Continue present medications and follow.  5 hyperlipidemia-continue statin.  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  This is likely related to his atrial flutter.  Hopefully reestablishing sinus rhythm will improve his symptoms.  We will continue Lasix 40 mg twice daily for now.  Check potassium and renal function.  We can likely decrease dose when we we restore sinus rhythm.  Kirk Ruths, MD

## 2019-09-21 NOTE — Patient Instructions (Signed)
Medication Instructions:  NO CHANGE *If you need a refill on your cardiac medications before your next appointment, please call your pharmacy*   Lab Work: Your physician recommends that you HAVE LAB WORK TODAY If you have labs (blood work) drawn today and your tests are completely normal, you will receive your results only by: Marland Kitchen MyChart Message (if you have MyChart) OR . A paper copy in the mail If you have any lab test that is abnormal or we need to change your treatment, we will call you to review the results.   Follow-Up: At [facility], you and your health needs are our priority.  As part of our continuing mission to provide you with exceptional heart care, we have created designated Provider Care Teams.  These Care Teams include your primary Cardiologist (physician) and Advanced Practice Providers (APPs -  Physician Assistants and Nurse Practitioners) who all work together to provide you with the care you need, when you need it.  We recommend signing up for the patient portal called "MyChart".  Sign up information is provided on this After Visit Summary.  MyChart is used to connect with patients for Virtual Visits (Telemedicine).  Patients are able to view lab/test results, encounter notes, upcoming appointments, etc.  Non-urgent messages can be sent to your provider as well.   To learn more about what you can do with MyChart, go to [email or web address].    Your next appointment:   6 week(s)  The format for your next appointment:   In Person  Provider:   Kirk Ruths, MD   Other Instructions  GO TO [facility] Friday 09-28-19 @ 1:30 PM FOR COVID TESTING  You are scheduled for a Cardioversion on Monday 10/01/19 with Dr. Harrell Gave.  Please arrive at the [facility] (Main Entrance A) at [facility]: [address] [place], [place] [address] at 10 am. (1 hour prior to procedure unless lab work is needed; if lab work is needed arrive 1.5 hours  ahead)  DIET: Nothing to eat or drink after midnight except a sip of water with medications (see medication instructions below)  Medication Instructions:  DO NOT TAKE METOPROLOL THE MORNING OF THE PROCEDURE  Continue your anticoagulant: ELIQUIS  You must have a responsible person to drive you home and stay in the waiting area during your procedure. Failure to do so could result in cancellation.  Bring your insurance cards.  *Special Note: Every effort is made to have your procedure done on time. Occasionally there are emergencies that occur at the hospital that may cause delays. Please be patient if a delay does occur.   SCHEDULE APPOINTMENT IN THE ATRIAL FIB CLINIC NEXT AVAILABLE

## 2019-09-21 NOTE — Progress Notes (Signed)
HPI:  FU CAD, ASand atrial flutter/fibrillation; history of coronary artery disease, status post coronary artery bypass graft surgery performed in June 2006. Patient had a LIMA to the LAD, saphenous vein graft to the diagonal, saphenous vein graft to the acute marginal and saphenous vein graft to the PDA. Carotid Dopplers August 2015 showed no significant stenosis. Nuclear study August 2015 showed ejection fraction 55% but no ischemia or infarction. Possible mild decrease in systolic blood pressure at peak exercise.Abdominal ultrasound June 2017 showed no aneurysm.Patient seen8/19with newly diagnosed atrial fibrillation. He was volume overloaded and was diuresed with Lasix.Had DCCV of atypical atrial flutter 11/19.Echocardiogram May 2021 showed normal LV function, grade 3 DD, moderate biatrial enlargement, mild to moderate aortic stenosis withmean gradient 18 mmHg. patient recently contacted the office with complaints of worsening edema and dyspnea.  He was concerned he might have developed recurrent atrial arrhythmias.  He was added to my schedule today.  Note his Lasix was doubled for 3 days.  Since last seenpatient notes increased lower extremity edema over the past 4 weeks and increased dyspnea on exertion.  No chest pain or palpitations.  He has taken additional Lasix with some improvement.  Current Outpatient Medications  Medication Sig Dispense Refill  . acetaminophen (TYLENOL) 500 MG tablet Take 1,000 mg by mouth daily as needed for moderate pain or headache.    Marland Kitchen atorvastatin (LIPITOR) 80 MG tablet TAKE 1 TABLET BY MOUTH EVERY DAY 90 tablet 1  . ELIQUIS 5 MG TABS tablet TAKE 1 TABLET BY MOUTH TWICE A DAY 60 tablet 6  . furosemide (LASIX) 20 MG tablet Take 2 tablets (40 mg total) by mouth daily. 180 tablet 3  . KLOR-CON M20 20 MEQ tablet TAKE 1 TABLET BY MOUTH EVERY DAY 30 tablet 11  . metoprolol succinate (TOPROL-XL) 25 MG 24 hr tablet TAKE 1 TABLET BY MOUTH EVERY DAY WITH OR  IMMEDIATELY FOLLOWING A MEAL 30 tablet 11  . tamsulosin (FLOMAX) 0.4 MG CAPS capsule TAKE ONE CAPSULE BY MOUTH EVERY DAY AFTER SUPPER 90 capsule 1   No current facility-administered medications for this visit.     Past Medical History:  Diagnosis Date  . Adjustment disorder with depressed mood 09/07/2007  . Colon polyps    Tubular Adenoma 2010  . CONGESTIVE HEART FAILURE 12/09/2008  . CORONARY ARTERY DISEASE 2006  . HYPERLIPIDEMIA 08/09/2006  . HYPERTENSION 08/09/2006  . MYOCARDIAL INFARCTION, HX OF 07/07/2004  . 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 OBESITY 07/19/2008    Past Surgical History:  Procedure Laterality Date  . CARDIOVERSION N/A 12/14/2017   Procedure: CARDIOVERSION;  Surgeon: [name], MD;  Location: [facility];  Service: Cardiovascular;  Laterality: N/A;  . COLONOSCOPY    . CORONARY ARTERY BYPASS GRAFT  2006    Social History   Socioeconomic History  . Marital status: Single    Spouse name: Not on file  . Number of children: Not on file  . Years of education: Not on file  . Highest education level: Not on file  Occupational History  . Not on file  Tobacco Use  . Smoking status: Never Smoker  . Smokeless tobacco: Never Used  Substance and Sexual Activity  . Alcohol use: Yes    Alcohol/week: 1.0 standard drink    Types: 1 Cans of beer per week    Comment: occasional  . Drug use: No  . Sexual activity: Not on file  Other Topics Concern  . Not on file  Social  History Narrative  . Not on file   Social Determinants of Health   Financial Resource Strain:   . Difficulty of Paying Living Expenses: Not on file  Food Insecurity:   . Worried About Charity fundraiser in the Last Year: Not on file  . Ran Out of Food in the Last Year: Not on file  Transportation Needs:   . Lack of Transportation (Medical): Not on file  . Lack of Transportation (Non-Medical): Not on file  Physical Activity:   . Days of Exercise per Week: Not on file  . Minutes of  Exercise per Session: Not on file  Stress:   . Feeling of Stress : Not on file  Social Connections:   . Frequency of Communication with Friends and Family: Not on file  . Frequency of Social Gatherings with Friends and Family: Not on file  . Attends Religious Services: Not on file  . Active Member of Clubs or Organizations: Not on file  . Attends Archivist Meetings: Not on file  . Marital Status: Not on file  Intimate Partner Violence:   . Fear of Current or Ex-Partner: Not on file  . Emotionally Abused: Not on file  . Physically Abused: Not on file  . Sexually Abused: Not on file    Family History  Problem Relation Age of Onset  . Hypertension Mother   . Hyperlipidemia Mother   . Heart disease Father   . Ulcers Father   . Bipolar disorder Brother   . Colon cancer Neg Hx     ROS: no fevers or chills, productive cough, hemoptysis, dysphasia, odynophagia, melena, hematochezia, dysuria, hematuria, rash, seizure activity, orthopnea, PND, pedal edema, claudication. Remaining systems are negative.  Physical Exam: Well-developed well-nourished in no acute distress.  Skin is warm and dry.  HEENT is normal.  Neck is supple.  Chest is clear to auscultation with normal expansion.  Cardiovascular exam is irregular, 3/6 systolic murmur left sternal border. Abdominal exam nontender or distended. No masses palpated. Extremities show 1+ ankle edema. neuro grossly intact  ECG-atrial flutter at a rate of 72, left anterior fascicular block, right bundle branch block, left ventricular hypertrophy, cannot rule out septal infarct.  Personally reviewed  A/P  1 paroxysmal atrial fibrillation-patient has developed recurrent atrial flutter.  He is symptomatic with worsening dyspnea on exertion and bilateral lower extremity edema.  He has been compliant with apixaban.  We will plan to proceed with elective cardioversion.  This is his second episode of atrial arrhythmias.  He is very  symptomatic with these bouts.  I will therefore have him seen in atrial fibrillation clinic for consideration of antiarrhythmic such as Tikosyn.  2 mild to moderate aortic stenosis-patient will need follow-up echocardiogram May 2022.  3 coronary artery disease-patient denies chest pain.  Plan to continue statin.  He is not on aspirin given need for apixaban.  4 hypertension-patient's blood pressure is controlled.  Continue present medications and follow.  5 hyperlipidemia-continue statin.  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  This is likely related to his atrial flutter.  Hopefully reestablishing sinus rhythm will improve his symptoms.  We will continue Lasix 40 mg twice daily for now.  Check potassium and renal function.  We can likely decrease dose when we we restore sinus rhythm.  Kirk Ruths, MD

## 2019-09-28 ENCOUNTER — Other Ambulatory Visit (HOSPITAL_COMMUNITY)
Admission: RE | Admit: 2019-09-28 | Discharge: 2019-09-28 | Disposition: A | Discharge status: Home / Self Care | Payer: 59 | Source: Ambulatory Visit | Attending: Cardiology | Admitting: Cardiology

## 2019-09-28 DIAGNOSIS — Z20822 Contact with and (suspected) exposure to covid-19: Secondary | ICD-10-CM | POA: Diagnosis not present

## 2019-09-28 DIAGNOSIS — Z01812 Encounter for preprocedural laboratory examination: Secondary | ICD-10-CM | POA: Diagnosis present

## 2019-09-28 LAB — SARS CORONAVIRUS 2 (TAT 6-24 HRS): SARS Coronavirus 2: NEGATIVE

## 2019-10-01 ENCOUNTER — Other Ambulatory Visit: Payer: Self-pay

## 2019-10-01 ENCOUNTER — Ambulatory Visit (HOSPITAL_COMMUNITY)
Admission: RE | Admit: 2019-10-01 | Discharge: 2019-10-01 | Disposition: A | Discharge status: Home / Self Care | Payer: 59 | Attending: Cardiology | Admitting: Cardiology

## 2019-10-01 ENCOUNTER — Ambulatory Visit (HOSPITAL_COMMUNITY): Payer: 59 | Admitting: Anesthesiology

## 2019-10-01 ENCOUNTER — Encounter (HOSPITAL_COMMUNITY): Payer: Self-pay | Admitting: Cardiology

## 2019-10-01 ENCOUNTER — Encounter (HOSPITAL_COMMUNITY)
Admission: RE | Disposition: A | Discharge status: Home / Self Care | Payer: 59 | Source: Home / Self Care | Attending: Cardiology

## 2019-10-01 DIAGNOSIS — I252 Old myocardial infarction: Secondary | ICD-10-CM | POA: Diagnosis not present

## 2019-10-01 DIAGNOSIS — I480 Paroxysmal atrial fibrillation: Secondary | ICD-10-CM | POA: Insufficient documentation

## 2019-10-01 DIAGNOSIS — I350 Nonrheumatic aortic (valve) stenosis: Secondary | ICD-10-CM | POA: Diagnosis not present

## 2019-10-01 DIAGNOSIS — Z8249 Family history of ischemic heart disease and other diseases of the circulatory system: Secondary | ICD-10-CM | POA: Insufficient documentation

## 2019-10-01 DIAGNOSIS — Z951 Presence of aortocoronary bypass graft: Secondary | ICD-10-CM | POA: Diagnosis not present

## 2019-10-01 DIAGNOSIS — I2510 Atherosclerotic heart disease of native coronary artery without angina pectoris: Secondary | ICD-10-CM | POA: Diagnosis not present

## 2019-10-01 DIAGNOSIS — I484 Atypical atrial flutter: Secondary | ICD-10-CM | POA: Insufficient documentation

## 2019-10-01 DIAGNOSIS — Z79899 Other long term (current) drug therapy: Secondary | ICD-10-CM | POA: Diagnosis not present

## 2019-10-01 DIAGNOSIS — I4892 Unspecified atrial flutter: Secondary | ICD-10-CM

## 2019-10-01 DIAGNOSIS — I5043 Acute on chronic combined systolic (congestive) and diastolic (congestive) heart failure: Secondary | ICD-10-CM | POA: Diagnosis not present

## 2019-10-01 DIAGNOSIS — E669 Obesity, unspecified: Secondary | ICD-10-CM | POA: Insufficient documentation

## 2019-10-01 DIAGNOSIS — E785 Hyperlipidemia, unspecified: Secondary | ICD-10-CM | POA: Insufficient documentation

## 2019-10-01 DIAGNOSIS — Z6838 Body mass index (BMI) 38.0-38.9, adult: Secondary | ICD-10-CM | POA: Diagnosis not present

## 2019-10-01 DIAGNOSIS — Z7901 Long term (current) use of anticoagulants: Secondary | ICD-10-CM | POA: Diagnosis not present

## 2019-10-01 DIAGNOSIS — I110 Hypertensive heart disease with heart failure: Secondary | ICD-10-CM | POA: Diagnosis not present

## 2019-10-01 HISTORY — PX: CARDIOVERSION: SHX1299

## 2019-10-01 HISTORY — DX: Sleep apnea, unspecified: G47.30

## 2019-10-01 SURGERY — CARDIOVERSION
Anesthesia: General

## 2019-10-01 MED ORDER — PROPOFOL 10 MG/ML IV BOLUS
INTRAVENOUS | Status: DC | PRN
  Administered 2019-10-01 (×3): 20 mg via INTRAVENOUS
  Administered 2019-10-01: 70 mg via INTRAVENOUS
  Administered 2019-10-01 (×2): 20 mg via INTRAVENOUS

## 2019-10-01 MED ORDER — LIDOCAINE 2% (20 MG/ML) 5 ML SYRINGE
INTRAMUSCULAR | Status: DC | PRN
  Administered 2019-10-01: 40 mg via INTRAVENOUS

## 2019-10-01 NOTE — Interval H&P Note (Signed)
History and Physical Interval Note:  10/01/2019 10:38 AM  [name]  has presented today for surgery, with the diagnosis of AFLUTTER.  The various methods of treatment have been discussed with the patient and family. After consideration of risks, benefits and other options for treatment, the patient has consented to  Procedure(s): CARDIOVERSION (N/A) as a surgical intervention.  The patient's history has been reviewed, patient examined, no change in status, stable for surgery.  I have reviewed the patient's chart and labs.  Questions were answered to the patient's satisfaction.     Noya Santarelli Harrell Gave

## 2019-10-01 NOTE — Transfer of Care (Signed)
Immediate Anesthesia Transfer of Care Note  Patient: [name]  Procedure(s) Performed: CARDIOVERSION (N/A )  Patient Location: Endoscopy Unit  Anesthesia Type:General  Level of Consciousness: awake, alert  and oriented  Airway & Oxygen Therapy: Patient Spontanous Breathing  Post-op Assessment: Report given to RN and Post -op Vital signs reviewed and stable  Post vital signs: Reviewed and stable  Last Vitals:  Vitals Value Taken Time  BP    Temp    Pulse    Resp    SpO2      Last Pain:  Vitals:   10/01/19 1033  TempSrc: Axillary  PainSc: 0-No pain         Complications: No complications documented.

## 2019-10-01 NOTE — Discharge Instructions (Signed)
Electrical Cardioversion Electrical cardioversion is the delivery of a jolt of electricity to restore a normal rhythm to the heart. A rhythm that is too fast or is not regular keeps the heart from pumping well. In this procedure, sticky patches or metal paddles are placed on the chest to deliver electricity to the heart from a device. This procedure may be done in an emergency if:  There is low or no blood pressure as a result of the heart rhythm.  Normal rhythm must be restored as fast as possible to protect the brain and heart from further damage.  It may save a life. This may also be a scheduled procedure for irregular or fast heart rhythms that are not immediately life-threatening. Tell a health care provider about:  Any allergies you have.  All medicines you are taking, including vitamins, herbs, eye drops, creams, and over-the-counter medicines.  Any problems you or family members have had with anesthetic medicines.  Any blood disorders you have.  Any surgeries you have had.  Any medical conditions you have.  Whether you are pregnant or may be pregnant. What are the risks? Generally, this is a safe procedure. However, problems may occur, including:  Allergic reactions to medicines.  A blood clot that breaks free and travels to other parts of your body.  The possible return of an abnormal heart rhythm within hours or days after the procedure.  Your heart stopping (cardiac arrest). This is rare. What happens before the procedure? Medicines  Your health care provider may have you start taking: ? Blood-thinning medicines (anticoagulants) so your blood does not clot as easily. ? Medicines to help stabilize your heart rate and rhythm.  Ask your health care provider about: ? Changing or stopping your regular medicines. This is especially important if you are taking diabetes medicines or blood thinners. ? Taking medicines such as aspirin and ibuprofen. These medicines can  thin your blood. Do not take these medicines unless your health care provider tells you to take them. ? Taking over-the-counter medicines, vitamins, herbs, and supplements. General instructions  Follow instructions from your health care provider about eating or drinking restrictions.  Plan to have someone take you home from the hospital or clinic.  If you will be going home right after the procedure, plan to have someone with you for 24 hours.  Ask your health care provider what steps will be taken to help prevent infection. These may include washing your skin with a germ-killing soap. What happens during the procedure?   An IV will be inserted into one of your veins.  Sticky patches (electrodes) or metal paddles may be placed on your chest.  You will be given a medicine to help you relax (sedative).  An electrical shock will be delivered. The procedure may vary among health care providers and hospitals. What can I expect after the procedure?  Your blood pressure, heart rate, breathing rate, and blood oxygen level will be monitored until you leave the hospital or clinic.  Your heart rhythm will be watched to make sure it does not change.  You may have some redness on the skin where the shocks were given. Follow these instructions at home:  Do not drive for 24 hours if you were given a sedative during your procedure.  Take over-the-counter and prescription medicines only as told by your health care provider.  Ask your health care provider how to check your pulse. Check it often.  Rest for 48 hours after the procedure or   as told by your health care provider.  Avoid or limit your caffeine use as told by your health care provider.  Keep all follow-up visits as told by your health care provider. This is important. Contact a health care provider if:  You feel like your heart is beating too quickly or your pulse is not regular.  You have a serious muscle cramp that does not go  away. Get help right away if:  You have discomfort in your chest.  You are dizzy or you feel faint.  You have trouble breathing or you are short of breath.  Your speech is slurred.  You have trouble moving an arm or leg on one side of your body.  Your fingers or toes turn cold or blue. Summary  Electrical cardioversion is the delivery of a jolt of electricity to restore a normal rhythm to the heart.  This procedure may be done right away in an emergency or may be a scheduled procedure if the condition is not an emergency.  Generally, this is a safe procedure.  After the procedure, check your pulse often as told by your health care provider. This information is not intended to replace advice given to you by your health care provider. Make sure you discuss any questions you have with your health care provider. Document Revised: 08/21/2018 Document Reviewed: 08/21/2018 Elsevier Patient Education  2020 [employer]  

## 2019-10-01 NOTE — Anesthesia Postprocedure Evaluation (Signed)
Anesthesia Post Note  Patient: [name]  Procedure(s) Performed: CARDIOVERSION (N/A )     Patient location during evaluation: Endoscopy Anesthesia Type: General Level of consciousness: awake and alert Pain management: pain level controlled Vital Signs Assessment: post-procedure vital signs reviewed and stable Respiratory status: spontaneous breathing, nonlabored ventilation, respiratory function stable and patient connected to nasal cannula oxygen Cardiovascular status: blood pressure returned to baseline and stable Postop Assessment: no apparent nausea or vomiting Anesthetic complications: no   No complications documented.  Last Vitals:  Vitals:   10/01/19 1124 10/01/19 1132  BP: (!) 122/44 (!) 134/47  Pulse: 81 80  Resp: 14 20  Temp:    SpO2: 97% 99%    Last Pain:  Vitals:   10/01/19 1132  TempSrc:   PainSc: 0-No pain                 Blessed Girdner L Ayriana Wix

## 2019-10-01 NOTE — Anesthesia Procedure Notes (Signed)
Procedure Name: General with mask airway Performed by: Gerrit Rafalski B, CRNA Pre-anesthesia Checklist: Patient identified, Emergency Drugs available, Suction available, Patient being monitored and Timeout performed Patient Re-evaluated:Patient Re-evaluated prior to induction Oxygen Delivery Method: Ambu bag Preoxygenation: Pre-oxygenation with 100% oxygen Induction Type: IV induction Ventilation: Mask ventilation without difficulty Placement Confirmation: positive ETCO2 Dental Injury: Teeth and Oropharynx as per pre-operative assessment        

## 2019-10-01 NOTE — Anesthesia Preprocedure Evaluation (Addendum)
Anesthesia Evaluation  Patient identified by MRN, date of birth, ID band Patient awake    Reviewed: Allergy & Precautions, NPO status , Patient's Chart, lab work & pertinent test results, reviewed documented beta blocker date and time   Airway Mallampati: II  TM Distance: >3 FB Neck ROM: Full    Dental no notable dental hx. (+) Teeth Intact, Dental Advisory Given   Pulmonary neg pulmonary ROS,    Pulmonary exam normal breath sounds clear to auscultation       Cardiovascular hypertension, Pt. on home beta blockers and Pt. on medications + CAD, + Past MI, + CABG (2006) and +CHF  Normal cardiovascular exam+ dysrhythmias Atrial Fibrillation + Valvular Problems/Murmurs AS  Rhythm:Irregular Rate:Normal  TTE 06/2019 1. Left ventricular ejection fraction, by estimation, is 55 to 60%. The left ventricle has normal function. The left ventricle has no regional wall motion abnormalities. Left ventricular diastolic parameters are consistent with Grade III diastolic dysfunction (restrictive). Elevated left ventricular end-diastolic pressure. The average left ventricular global longitudinal strain is -17.3%. The global longitudinal strain is normal.  2. Right ventricular systolic function is normal. The right ventricular size is normal. There is normal pulmonary artery systolic pressure.  3. Left atrial size was moderately dilated.  4. Right atrial size was moderately dilated.  5. The mitral valve is normal in structure. No evidence of mitral valve regurgitation. No evidence of mitral stenosis.  6. Aortic stenosis unchanged from 06/2018. The aortic valve is abnormal. Aortic valve regurgitation is not visualized. Mild to moderate aortic valve stenosis. Aortic valve area, by VTI measures 1.24 cm. Aortic valve  mean gradient measures 18.0 mmHg. Aortic valve Vmax measures 2.80 m/s.  7. The inferior vena cava is dilated in size with <50% respiratory  variability, suggesting right atrial pressure of 15 mmHg.    Neuro/Psych PSYCHIATRIC DISORDERS Depression negative neurological ROS     GI/Hepatic negative GI ROS, Neg liver ROS,   Endo/Other  negative endocrine ROSObese 38  Renal/GU negative Renal ROS  negative genitourinary   Musculoskeletal negative musculoskeletal ROS (+)   Abdominal   Peds  Hematology  (+) Blood dyscrasia (on eliquis), ,   Anesthesia Other Findings   Reproductive/Obstetrics                           Anesthesia Physical Anesthesia Plan  ASA: III  Anesthesia Plan: General   Post-op Pain Management:    Induction: Intravenous  PONV Risk Score and Plan: 2 and Propofol infusion and Treatment may vary due to age or medical condition  Airway Management Planned: Natural Airway  Additional Equipment:   Intra-op Plan:   Post-operative Plan:   Informed Consent: I have reviewed the patients History and Physical, chart, labs and discussed the procedure including the risks, benefits and alternatives for the proposed anesthesia with the patient or authorized representative who has indicated his/her understanding and acceptance.     Dental advisory given  Plan Discussed with: CRNA  Anesthesia Plan Comments:         Anesthesia Quick Evaluation

## 2019-10-01 NOTE — CV Procedure (Signed)
Procedure:   DCCV  Indication:  Symptomatic atrial flutter  Procedure Note:  The patient signed informed consent.  They have had had therapeutic anticoagulation with apixaban  greater than 3 weeks.  Anesthesia was administered by Dr. Lanetta Inch.  Patient received 170 mg IV propofol.Adequate airway was maintained throughout and vital followed per protocol.  They were cardioverted x 3 with 150, 200, 200J of biphasic synchronized energy.  They converted to normal sinus rhythm at 80 bpm and went into atrial bigeminy shortly after.  There were no apparent complications.  The patient had normal neuro status and respiratory status post procedure with vitals stable as recorded elsewhere.    Follow up:  They will continue on current medical therapy and follow up with cardiology as scheduled.  Buford Dresser, MD PhD 10/01/2019 11:15 AM

## 2019-10-02 ENCOUNTER — Encounter (HOSPITAL_COMMUNITY): Payer: Self-pay | Admitting: Cardiology

## 2019-10-23 ENCOUNTER — Other Ambulatory Visit: Payer: Self-pay | Admitting: Cardiology

## 2019-11-01 ENCOUNTER — Telehealth: Payer: Self-pay | Admitting: Cardiovascular Disease

## 2019-11-01 NOTE — Telephone Encounter (Signed)
New Message  Pt is calling to change sleep clinic appt   Please call

## 2019-11-13 ENCOUNTER — Ambulatory Visit: Payer: 59 | Admitting: Cardiovascular Disease

## 2019-11-16 NOTE — Progress Notes (Signed)
HPI: FU CAD, ASand atrial flutter/fibrillation; history of coronary artery disease, status post coronary artery bypass graft surgery performed in June 2006. Patient had a LIMA to the LAD, saphenous vein graft to the diagonal, saphenous vein graft to the acute marginal and saphenous vein graft to the PDA. Carotid Dopplers August 2015 showed no significant stenosis. Nuclear study August 2015 showed ejection fraction 55% but no ischemia or infarction. Possible mild decrease in systolic blood pressure at peak exercise.Abdominal ultrasound June 2017 showed no aneurysm.Patient seen8/19with newly diagnosed atrial fibrillation. He was volume overloaded and was diuresed with Lasix.Had DCCV of atypical atrial flutter 11/19.Echocardiogram May 2021showed normal LV function, grade 3 DD,moderate biatrial enlargement, mild to moderate aortic stenosis withmean gradient 6mmHg. At last office visit patient noted to be in recurrent atrial fibrillation. Patient subsequently underwent successful cardioversion on October 01, 2019.  We also had plan to have patient evaluated in atrial fibrillation clinic for consideration of Tikosyn.  Since last seenpatient's dyspnea has improved.  No chest pain.  Pedal edema has also improved.  However he is having occasional "dizzy spells".  He feels as though he is not balanced.  He has not had syncope.  These are transient in nature.  Current Outpatient Medications  Medication Sig Dispense Refill  . acetaminophen (TYLENOL) 500 MG tablet Take 1,000 mg by mouth daily as needed for moderate pain or headache.    Marland Kitchen atorvastatin (LIPITOR) 80 MG tablet TAKE 1 TABLET BY MOUTH EVERY DAY (Patient taking differently: Take 80 mg by mouth daily. ) 90 tablet 1  . ELIQUIS 5 MG TABS tablet TAKE 1 TABLET BY MOUTH TWICE A DAY (Patient taking differently: Take 5 mg by mouth 2 (two) times daily. ) 60 tablet 6  . furosemide (LASIX) 20 MG tablet Take 2 tablets (40 mg total) by mouth daily.  (Patient taking differently: Take 20 mg by mouth 2 (two) times daily. Pt takes 2 tablets in the morning and 2 tablets at night.) 180 tablet 3  . KLOR-CON M20 20 MEQ tablet TAKE 1 TABLET BY MOUTH EVERY DAY (Patient taking differently: Take 20 mEq by mouth daily. Pt is taking 2 tablets in the morning and 2 tablets at night.) 30 tablet 11  . metoprolol succinate (TOPROL-XL) 25 MG 24 hr tablet TAKE 1 TABLET BY MOUTH EVERY DAY WITH OR IMMEDIATELY FOLLOWING A MEAL 30 tablet 11  . tamsulosin (FLOMAX) 0.4 MG CAPS capsule TAKE ONE CAPSULE BY MOUTH EVERY DAY AFTER SUPPER (Patient taking differently: Take 0.4 mg by mouth daily after supper. ) 90 capsule 1   No current facility-administered medications for this visit.     Past Medical History:  Diagnosis Date  . Adjustment disorder with depressed mood 09/07/2007  . Colon polyps    Tubular Adenoma 2010  . CONGESTIVE HEART FAILURE 12/09/2008  . CORONARY ARTERY DISEASE 2006  . HYPERLIPIDEMIA 08/09/2006  . HYPERTENSION 08/09/2006  . MYOCARDIAL INFARCTION, HX OF 07/07/2004  . 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 OBESITY 07/19/2008  . Sleep apnea     Past Surgical History:  Procedure Laterality Date  . CARDIOVERSION N/A 12/14/2017   Procedure: CARDIOVERSION;  Surgeon: [name], MD;  Location: [facility];  Service: Cardiovascular;  Laterality: N/A;  . CARDIOVERSION N/A 10/01/2019   Procedure: CARDIOVERSION;  Surgeon: Buford Dresser, MD;  Location: [facility] ENDOSCOPY;  Service: Cardiovascular;  Laterality: N/A;  . COLONOSCOPY    . CORONARY ARTERY BYPASS GRAFT  2006    Social History   Socioeconomic  History  . Marital status: Single    Spouse name: Not on file  . Number of children: Not on file  . Years of education: Not on file  . Highest education level: Not on file  Occupational History  . Not on file  Tobacco Use  . Smoking status: Never Smoker  . Smokeless tobacco: Never Used  Substance and Sexual Activity  . Alcohol use: Yes     Alcohol/week: 1.0 standard drink    Types: 1 Cans of beer per week    Comment: occasional  . Drug use: No  . Sexual activity: Not on file  Other Topics Concern  . Not on file  Social History Narrative  . Not on file   Social Determinants of Health   Financial Resource Strain:   . Difficulty of Paying Living Expenses: Not on file  Food Insecurity:   . Worried About Charity fundraiser in the Last Year: Not on file  . Ran Out of Food in the Last Year: Not on file  Transportation Needs:   . Lack of Transportation (Medical): Not on file  . Lack of Transportation (Non-Medical): Not on file  Physical Activity:   . Days of Exercise per Week: Not on file  . Minutes of Exercise per Session: Not on file  Stress:   . Feeling of Stress : Not on file  Social Connections:   . Frequency of Communication with Friends and Family: Not on file  . Frequency of Social Gatherings with Friends and Family: Not on file  . Attends Religious Services: Not on file  . Active Member of Clubs or Organizations: Not on file  . Attends Archivist Meetings: Not on file  . Marital Status: Not on file  Intimate Partner Violence:   . Fear of Current or Ex-Partner: Not on file  . Emotionally Abused: Not on file  . Physically Abused: Not on file  . Sexually Abused: Not on file    Family History  Problem Relation Age of Onset  . Hypertension Mother   . Hyperlipidemia Mother   . Heart disease Father   . Ulcers Father   . Bipolar disorder Brother   . Colon cancer Neg Hx     ROS: no fevers or chills, productive cough, hemoptysis, dysphasia, odynophagia, melena, hematochezia, dysuria, hematuria, rash, seizure activity, orthopnea, PND, pedal edema, claudication. Remaining systems are negative.  Physical Exam: Well-developed well-nourished in no acute distress.  Skin is warm and dry.  HEENT is normal.  Neck is supple.  Chest is clear to auscultation with normal expansion.  Cardiovascular exam is  regular rate and rhythm.  3/6 systolic murmur left sternal border.  S2 is not diminished. Abdominal exam nontender or distended. No masses palpated. Extremities show 1+ ankle edema. neuro grossly intact  ECG-sinus rhythm with first-degree AV block, left anterior fascicular block, right bundle branch block, left ventricular hypertrophy, cannot rule out septal infarct.  Personally reviewed  A/P  1 paroxysmal atrial fibrillation-patient remains in sinus rhythm status post cardioversion.  Continue apixaban.  We discussed referral to atrial fibrillation clinic for consideration of Tikosyn.  However it has been approximately 2 years since his previous cardioversion.  We will therefore follow for now.  If episodes become more frequent we will plan referral in the future.  2 aortic stenosis-mild to moderate on most recent echocardiogram.  Plan follow-up study May 2022.  3 coronary artery disease-patient denies chest pain.  Continue statin.  No aspirin given need for  apixaban.  4 chronic combined systolic/diastolic congestive heart failure-continue Lasix at present dose.  His volume status is improved.  Check potassium and renal function.  5 hypertension-blood pressure controlled.  Continue present medical regimen.  6 hyperlipidemia-continue statin.  7 dizzy spells-patient describes "imbalance" at times.  Not clearly when he is changing positions but can also occur when he changes his visual field.  We will arrange a 7-day monitor to make sure that he is not having bradycardia.  Kirk Ruths, MD

## 2019-11-22 ENCOUNTER — Encounter: Payer: Self-pay | Admitting: *Deleted

## 2019-11-22 ENCOUNTER — Other Ambulatory Visit: Payer: Self-pay

## 2019-11-22 ENCOUNTER — Encounter: Payer: Self-pay | Admitting: Cardiology

## 2019-11-22 ENCOUNTER — Ambulatory Visit: Payer: 59 | Admitting: Cardiology

## 2019-11-22 VITALS — BP 126/56 | HR 76 | Ht 66.0 in | Wt 227.6 lb

## 2019-11-22 DIAGNOSIS — I350 Nonrheumatic aortic (valve) stenosis: Secondary | ICD-10-CM | POA: Diagnosis not present

## 2019-11-22 DIAGNOSIS — I2510 Atherosclerotic heart disease of native coronary artery without angina pectoris: Secondary | ICD-10-CM

## 2019-11-22 DIAGNOSIS — I5042 Chronic combined systolic (congestive) and diastolic (congestive) heart failure: Secondary | ICD-10-CM

## 2019-11-22 DIAGNOSIS — R42 Dizziness and giddiness: Secondary | ICD-10-CM

## 2019-11-22 DIAGNOSIS — I10 Essential (primary) hypertension: Secondary | ICD-10-CM

## 2019-11-22 MED ORDER — FUROSEMIDE 20 MG PO TABS
40.0000 mg | ORAL_TABLET | Freq: Two times a day (BID) | ORAL | 3 refills | Status: DC
Start: 2019-11-22 — End: 2019-12-12

## 2019-11-22 NOTE — Progress Notes (Signed)
Patient ID: [name], male   DOB: 21-Jan-1951, 69 y.o.   MRN: [id number] Patient enrolled for [employer] to ship a 7 day ZIO XT long term holter monitor to his home.

## 2019-11-22 NOTE — Patient Instructions (Signed)
Lab Work:  Your physician recommends that you HAVE LAB WORK TODAY  If you have labs (blood work) drawn today and your tests are completely normal, you will receive your results only by: Marland Kitchen MyChart Message (if you have MyChart) OR . A paper copy in the mail If you have any lab test that is abnormal or we need to change your treatment, we will call you to review the results.   Testing/Procedures:  [facility]- Long Term Monitor Instructions   Your physician has requested you wear your ZIO patch monitor___7____days.   This is a single patch monitor.  [employer] supplies one patch monitor per enrollment.  Additional stickers are not available.   Please do not apply patch if you will be having a Nuclear Stress Test, Echocardiogram, Cardiac CT, MRI, or Chest Xray during the time frame you would be wearing the monitor. The patch cannot be worn during these tests.  You cannot remove and re-apply the ZIO XT patch monitor.   Your ZIO patch monitor will be sent USPS Priority mail from [facility] directly to your home address. The monitor may also be mailed to a PO BOX if home delivery is not available.   It may take 3-5 days to receive your monitor after you have been enrolled.   Once you have received you monitor, please review enclosed instructions.  Your monitor has already been registered assigning a specific monitor serial # to you.   Applying the monitor   Shave hair from upper left chest.   Hold abrader disc by orange tab.  Rub abrader in 40 strokes over left upper chest as indicated in your monitor instructions.   Clean area with 4 enclosed alcohol pads .  Use all pads to assure are is cleaned thoroughly.  Let dry.   Apply patch as indicated in monitor instructions.  Patch will be place under collarbone on left side of chest with arrow pointing upward.   Rub patch adhesive wings for 2 minutes.Remove white label marked "1".  Remove white label marked "2".  Rub patch adhesive wings  for 2 additional minutes.   While looking in a mirror, press and release button in center of patch.  A small green light will flash 3-4 times .  This will be your only indicator the monitor has been turned on.     Do not shower for the first 24 hours.  You may shower after the first 24 hours.   Press button if you feel a symptom. You will hear a small click.  Record Date, Time and Symptom in the Patient Log Book.   When you are ready to remove patch, follow instructions on last 2 pages of Patient Log Book.  Stick patch monitor onto last page of Patient Log Book.   Place Patient Log Book in [place] box.  Use locking tab on box and tape box closed securely.  The [employer] has [employer] on it.  Please place in mailbox as soon as possible.  Your physician should have your test results approximately 7 days after the monitor has been mailed back to [facility]   Call [facility] at [phone] if you have questions regarding your ZIO XT patch monitor.  Call them immediately if you see an orange light blinking on your monitor.   If your monitor falls off in less than 4 days contact our Monitor department at [phone].  If your monitor becomes loose or falls off after 4 days  call [employer] at [phone] for suggestions on securing your monitor.      Follow-Up: At [facility], you and your health needs are our priority.  As part of our continuing mission to provide you with exceptional heart care, we have created designated Provider Care Teams.  These Care Teams include your primary Cardiologist (physician) and Advanced Practice Providers (APPs -  Physician Assistants and Nurse Practitioners) who all work together to provide you with the care you need, when you need it.  We recommend signing up for the patient portal called "MyChart".  Sign up information is provided on this After Visit Summary.  MyChart is used to connect with patients for Virtual Visits  (Telemedicine).  Patients are able to view lab/test results, encounter notes, upcoming appointments, etc.  Non-urgent messages can be sent to your provider as well.   To learn more about what you can do with MyChart, go to [email or web address].    Your next appointment:   3 month(s)  The format for your next appointment:   In Person  Provider:   Kirk Ruths, MD

## 2019-11-22 NOTE — Addendum Note (Signed)
Addended by: Cristopher Estimable on: 11/22/2019 02:57 PM   Modules accepted: Orders

## 2019-11-22 NOTE — Addendum Note (Signed)
Addended by: Cristopher Estimable on: 11/22/2019 01:21 PM   Modules accepted: Orders

## 2019-11-23 ENCOUNTER — Other Ambulatory Visit: Payer: Self-pay | Admitting: *Deleted

## 2019-11-23 DIAGNOSIS — E875 Hyperkalemia: Secondary | ICD-10-CM

## 2019-11-23 LAB — BASIC METABOLIC PANEL
BUN/Creatinine Ratio: 13 (ref 10–24)
BUN: 11 mg/dL (ref 8–27)
CO2: 25 mmol/L (ref 20–29)
Calcium: 9.3 mg/dL (ref 8.6–10.2)
Chloride: 105 mmol/L (ref 96–106)
Creatinine, Ser: 0.87 mg/dL (ref 0.76–1.27)
GFR calc Af Amer: 101 mL/min/{1.73_m2} (ref >59)
GFR calc non Af Amer: 87 mL/min/{1.73_m2} (ref >59)
Glucose: 116 mg/dL — ABNORMAL HIGH (ref 65–99)
Potassium: 5.3 mmol/L — ABNORMAL HIGH (ref 3.5–5.2)
Sodium: 142 mmol/L (ref 134–144)

## 2019-11-30 ENCOUNTER — Telehealth: Payer: Self-pay | Admitting: Internal Medicine

## 2019-11-30 ENCOUNTER — Other Ambulatory Visit: Payer: Self-pay

## 2019-11-30 ENCOUNTER — Ambulatory Visit (INDEPENDENT_AMBULATORY_CARE_PROVIDER_SITE_OTHER): Payer: 59 | Admitting: Internal Medicine

## 2019-11-30 ENCOUNTER — Encounter: Payer: Self-pay | Admitting: Internal Medicine

## 2019-11-30 VITALS — BP 110/60 | HR 65 | Temp 98.6°F | Ht 66.0 in | Wt 228.6 lb

## 2019-11-30 DIAGNOSIS — R7302 Impaired glucose tolerance (oral): Secondary | ICD-10-CM | POA: Diagnosis not present

## 2019-11-30 DIAGNOSIS — E785 Hyperlipidemia, unspecified: Secondary | ICD-10-CM

## 2019-11-30 DIAGNOSIS — Z23 Encounter for immunization: Secondary | ICD-10-CM | POA: Diagnosis not present

## 2019-11-30 DIAGNOSIS — I350 Nonrheumatic aortic (valve) stenosis: Secondary | ICD-10-CM

## 2019-11-30 DIAGNOSIS — I5042 Chronic combined systolic (congestive) and diastolic (congestive) heart failure: Secondary | ICD-10-CM

## 2019-11-30 DIAGNOSIS — Z0000 Encounter for general adult medical examination without abnormal findings: Secondary | ICD-10-CM

## 2019-11-30 DIAGNOSIS — I484 Atypical atrial flutter: Secondary | ICD-10-CM

## 2019-11-30 DIAGNOSIS — Z86010 Personal history of colonic polyps: Secondary | ICD-10-CM

## 2019-11-30 DIAGNOSIS — I10 Essential (primary) hypertension: Secondary | ICD-10-CM

## 2019-11-30 NOTE — Progress Notes (Signed)
Established Patient Office Visit     This visit occurred during the SARS-CoV-2 public health emergency.  Safety protocols were in place, including screening questions prior to the visit, additional usage of staff PPE, and extensive cleaning of exam room while observing appropriate contact time as indicated for disinfecting solutions.    CC/Reason for Visit: Annual preventive exam and subsequent Medicare wellness visit  HPI: [name] is a 69 y.o. male who is coming in today for the above mentioned reasons. 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 Stanford Breed, atrial fibrillation status post DCCV on Eliquis and Toprol, obstructive sleep apnea on nightly CPAP, hypertension, hyperlipidemia, morbid obesity.  Since I last saw him he has had a cardioversion in late August.  Cardiology is considering starting him on rhythm control with Tikosyn.  He has been having some bradycardia episodes and is in the process of doing a 7-day heart monitor.  He has otherwise been doing well.  He has routine eye and dental care.  He is due for his flu, COVID booster and shingles vaccinations.  He had a colonoscopy in 2019.  He has no acute complaints today.   Past Medical/Surgical History: Past Medical History:  Diagnosis Date  . Adjustment disorder with depressed mood 09/07/2007  . Colon polyps    Tubular Adenoma 2010  . CONGESTIVE HEART FAILURE 12/09/2008  . CORONARY ARTERY DISEASE 2006  . HYPERLIPIDEMIA 08/09/2006  . HYPERTENSION 08/09/2006  . MYOCARDIAL INFARCTION, HX OF 07/07/2004  . 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 OBESITY 07/19/2008  . Sleep apnea     Past Surgical History:  Procedure Laterality Date  . CARDIOVERSION N/A 12/14/2017   Procedure: CARDIOVERSION;  Surgeon: [name], MD;  Location: [place];  Service: Cardiovascular;  Laterality:  N/A;  . CARDIOVERSION N/A 10/01/2019   Procedure: CARDIOVERSION;  Surgeon: Buford Dresser, MD;  Location: [facility];  Service: Cardiovascular;  Laterality: N/A;  . COLONOSCOPY    . CORONARY ARTERY BYPASS GRAFT  2006    Social History:  reports that he has never smoked. He has never used smokeless tobacco. He reports current alcohol use of about 1.0 standard drink of alcohol per week. He reports that he does not use drugs.  Allergies: No Known Allergies  Family History:  Family History  Problem Relation Age of Onset  . Hypertension Mother   . Hyperlipidemia Mother   . Heart disease Father   . Ulcers Father   . Bipolar disorder Brother   . Colon cancer Neg Hx      Current Outpatient Medications:  .  acetaminophen (TYLENOL) 500 MG tablet, Take 1,000 mg by mouth daily as needed for moderate pain or headache., Disp: , Rfl:  .  atorvastatin (LIPITOR) 80 MG tablet, TAKE 1 TABLET BY MOUTH EVERY DAY (Patient taking differently: Take 80 mg by mouth daily. ), Disp: 90 tablet, Rfl: 1 .  ELIQUIS 5 MG TABS tablet, TAKE 1 TABLET BY MOUTH TWICE A DAY (Patient taking differently: Take 5 mg by mouth 2 (two) times daily. ), Disp: 60 tablet, Rfl: 6 .  furosemide (LASIX) 20 MG tablet, Take 2 tablets (40 mg total) by mouth 2 (two) times daily., Disp: 360 tablet, Rfl: 3 .  KLOR-CON M20 20 MEQ tablet, TAKE 1 TABLET BY MOUTH EVERY DAY (Patient taking differently: Take 20 mEq by mouth daily. Pt is taking 2 tablets  in the morning and 2 tablets at night.), Disp: 30 tablet, Rfl: 11 .  metoprolol succinate (TOPROL-XL) 25 MG 24 hr tablet, TAKE 1 TABLET BY MOUTH EVERY DAY WITH OR IMMEDIATELY FOLLOWING A MEAL, Disp: 30 tablet, Rfl: 11 .  tamsulosin (FLOMAX) 0.4 MG CAPS capsule, TAKE ONE CAPSULE BY MOUTH EVERY DAY AFTER SUPPER (Patient taking differently: Take 0.4 mg by mouth daily after supper. ), Disp: 90 capsule, Rfl: 1  Review of Systems:  Constitutional: Denies fever, chills, diaphoresis, appetite  change and fatigue.  HEENT: Denies photophobia, eye pain, redness, hearing loss, ear pain, congestion, sore throat, rhinorrhea, sneezing, mouth sores, trouble swallowing, neck pain, neck stiffness and tinnitus.   Respiratory: Denies SOB, DOE, cough, chest tightness,  and wheezing.   Cardiovascular: Denies chest pain, palpitations. Gastrointestinal: Denies nausea, vomiting, abdominal pain, diarrhea, constipation, blood in stool and abdominal distention.  Genitourinary: Denies dysuria, urgency, frequency, hematuria, flank pain and difficulty urinating.  Endocrine: Denies: hot or cold intolerance, sweats, changes in hair or nails, polyuria, polydipsia. Musculoskeletal: Denies myalgias, back pain, joint swelling, arthralgias and gait problem.  Skin: Denies pallor, rash and wound.  Neurological: Denies dizziness, seizures, syncope, weakness, light-headedness, numbness and headaches.  Hematological: Denies adenopathy. Easy bruising, personal or family bleeding history  Psychiatric/Behavioral: Denies suicidal ideation, mood changes, confusion, nervousness, sleep disturbance and agitation    Physical Exam: Vitals:   11/30/19 0851  BP: 110/60  Pulse: 65  Temp: 98.6 F (37 C)  TempSrc: Oral  SpO2: 95%  Weight: 228 lb 9.6 oz (103.7 kg)  Height: _0  (1.676 m)    Body mass index is 36.9 kg/m.   Constitutional: NAD, calm, comfortable, obese Eyes: PERRL, lids and conjunctivae normal, wears corrective lenses ENMT: Mucous membranes are moist. Posterior pharynx clear of any exudate or lesions. Normal dentition. Tympanic membrane is pearly white, no erythema or bulging. Neck: normal, supple, no masses, no thyromegaly Respiratory: clear to auscultation bilaterally, no wheezing, no crackles. Normal respiratory effort. No accessory muscle use.  Cardiovascular: Regular rate and rhythm, no murmurs / rubs / gallops. No extremity edema. 2+ pedal pulses. No carotid bruits.  Abdomen: no tenderness, no  masses palpated. No hepatosplenomegaly. Bowel sounds positive.  Musculoskeletal: no clubbing / cyanosis. No joint deformity upper and lower extremities. Good ROM, no contractures. Normal muscle tone.  Skin: no rashes, lesions, ulcers. No induration Neurologic: CN 2-12 grossly intact. Sensation intact, DTR normal. Strength 5/5 in all 4.  Psychiatric: Normal judgment and insight. Alert and oriented x 3. Normal mood.    Subsequent Medicare wellness visit   1. Risk factors, based on past  M,S,F - Cardiovascular disease risk factors include age, gender, history of hypertension, history of hyperlipidemia, history of coronary artery disease   2.  Physical activities: Very sedentary   3.  Depression/mood:  Stable, not depressed   4.  Hearing:  No perceived issues   5.  ADL's: Independent in all ADLs   6.  Fall risk:  Moderate fall risk due to a prior fall this year   7.  Home safety: No problems identified   8.  Height weight, and visual acuity: height and weight as above, vision:   Visual Acuity Screening   Right eye Left eye Both eyes  Without correction:     With correction: _1     9.  Counseling:  Advised that he update his shingles and Covid booster at pharmacy, have advised increased exercise and healthy lifestyle.   10. Lab orders  based on risk factors: Laboratory update will be reviewed   11. Referral :  None today   12. Care plan:  Follow-up with me in 1 year or sooner as needed   13. Cognitive assessment:  No cognitive impairment   14. Screening: Patient provided with a written and personalized 5-10 year screening schedule in the AVS.   yes   15. Provider List Update:   PCP, cardiology  16. Advance Directives: Full code     Office Visit from 11/30/2019 in [place] at [facility] Total Score 0      Fall Risk  11/30/2019 11/29/2018 04/11/2018 07/04/2017 07/18/2015  Falls in the past year? 1 0 0 No No  Number falls in past yr: 0 0 0 - -   Injury with Fall? 0 0 0 - -     Impression and Plan:  Encounter for preventive health examination -He has routine eye and dental care. -Flu vaccine today.  He will get shingles series and Covid booster at pharmacy, otherwise immunizations are up-to-date. -Healthy lifestyle discussed in detail. -Screening labs today. -Colonoscopy in 2019, 5-year callback. -Check PSA today.  Atypical atrial flutter (HCC) Nonrheumatic aortic valve stenosis Chronic combined systolic and diastolic heart failure ([place])  -Followed by cardiology.  Impaired glucose tolerance  - Plan: Hemoglobin A1c -Last A1c was 6.0 in September 2020.  Morbid obesity ([place]) -Discussed healthy lifestyle, including increased physical activity and better food choices to promote weight loss.  Dyslipidemia  - Plan: Lipid panel -Last LDL was 51 in June 2021, he is on atorvastatin daily.  Essential hypertension -Well-controlled today.  Personal history of colonic polyps -Had a colonoscopy in 2019, redo in 2024.  Need for influenza vaccination -Flu vaccine administered today.   Patient Instructions  -Nice seeing you today!!  -Lab work today; will notify you once results are available.  -See you back in 1 year or sooner as needed.  -Flu vaccine today.  -Remember your shingles and COVID booster at the pharmacy.   Preventive Care 66 Years and Older, Male Preventive care refers to lifestyle choices and visits with your health care provider that can promote health and wellness. This includes:  A yearly physical exam. This is also called an annual well check.  Regular dental and eye exams.  Immunizations.  Screening for certain conditions.  Healthy lifestyle choices, such as diet and exercise. What can I expect for my preventive care visit? Physical exam Your health care provider will check:  Height and weight. These may be used to calculate body mass index (BMI), which is a measurement that tells if you  are at a healthy weight.  Heart rate and blood pressure.  Your skin for abnormal spots. Counseling Your health care provider may ask you questions about:  Alcohol, tobacco, and drug use.  Emotional well-being.  Home and relationship well-being.  Sexual activity.  Eating habits.  History of falls.  Memory and ability to understand (cognition).  Work and work Statistician. What immunizations do I need?  Influenza (flu) vaccine  This is recommended every year. Tetanus, diphtheria, and pertussis (Tdap) vaccine  You may need a Td booster every 10 years. Varicella (chickenpox) vaccine  You may need this vaccine if you have not already been vaccinated. Zoster (shingles) vaccine  You may need this after age 24. Pneumococcal conjugate (PCV13) vaccine  One dose is recommended after age 13. Pneumococcal polysaccharide (PPSV23) vaccine  One dose is recommended after age 80. Measles, mumps, and rubella (MMR) vaccine  You may need at least one dose of MMR if you were born in 1957 or later. You may also need a second dose. Meningococcal conjugate (MenACWY) vaccine  You may need this if you have certain conditions. Hepatitis A vaccine  You may need this if you have certain conditions or if you travel or work in places where you may be exposed to hepatitis A. Hepatitis B vaccine  You may need this if you have certain conditions or if you travel or work in places where you may be exposed to hepatitis B. Haemophilus influenzae type b (Hib) vaccine  You may need this if you have certain conditions. You may receive vaccines as individual doses or as more than one vaccine together in one shot (combination vaccines). Talk with your health care provider about the risks and benefits of combination vaccines. What tests do I need? Blood tests  Lipid and cholesterol levels. These may be checked every 5 years, or more frequently depending on your overall health.  Hepatitis C  test.  Hepatitis B test. Screening  Lung cancer screening. You may have this screening every year starting at age 79 if you have a 30-pack-year history of smoking and currently smoke or have quit within the past 15 years.  Colorectal cancer screening. All adults should have this screening starting at age 84 and continuing until age 14. Your health care provider may recommend screening at age 49 if you are at increased risk. You will have tests every 1-10 years, depending on your results and the type of screening test.  Prostate cancer screening. Recommendations will vary depending on your family history and other risks.  Diabetes screening. This is done by checking your blood sugar (glucose) after you have not eaten for a while (fasting). You may have this done every 1-3 years.  Abdominal aortic aneurysm (AAA) screening. You may need this if you are a current or former smoker.  Sexually transmitted disease (STD) testing. Follow these instructions at home: Eating and drinking  Eat a diet that includes fresh fruits and vegetables, whole grains, lean protein, and low-fat dairy products. Limit your intake of foods with high amounts of sugar, saturated fats, and salt.  Take vitamin and mineral supplements as recommended by your health care provider.  Do not drink alcohol if your health care provider tells you not to drink.  If you drink alcohol: ? Limit how much you have to 0-2 drinks a day. ? Be aware of how much alcohol is in your drink. In the [place] one drink equals one 12 oz bottle of beer (355 mL), one 5 oz glass of wine (148 mL), or one 1 oz glass of hard liquor (44 mL). Lifestyle  Take daily care of your teeth and gums.  Stay active. Exercise for at least 30 minutes on 5 or more days each week.  Do not use any products that contain nicotine or tobacco, such as cigarettes, e-cigarettes, and chewing tobacco. If you need help quitting, ask your health care provider.  If you are  sexually active, practice safe sex. Use a condom or other form of protection to prevent STIs (sexually transmitted infections).  Talk with your health care provider about taking a low-dose aspirin or statin. What's next?  Visit your health care provider once a year for a well check visit.  Ask your health care provider how often you should have your eyes and teeth checked.  Stay up to date on all vaccines. This information is not intended  to replace advice given to you by your health care provider. Make sure you discuss any questions you have with your health care provider. Document Revised: 01/12/2018 Document Reviewed: 01/12/2018 Elsevier Patient Education  2020 [place], [place] [facility]

## 2019-11-30 NOTE — Telephone Encounter (Signed)
VA Disabled Parking Placard to be filled out- placed in Dr's folder.  Patient had [place] form filled out during his visit, however he lives in [place] so needs a [place] form filled out as well.  Call [phone] upon completion.

## 2019-11-30 NOTE — Addendum Note (Signed)
Addended by: Westley Hummer B on: 11/30/2019 11:43 AM   Modules accepted: Orders

## 2019-11-30 NOTE — Patient Instructions (Signed)
-Nice seeing you today!!  -Lab work today; will notify you once results are available.  -See you back in 1 year or sooner as needed.  -Flu vaccine today.  -Remember your shingles and COVID booster at the pharmacy.   Preventive Care 70 Years and Older, Male Preventive care refers to lifestyle choices and visits with your health care provider that can promote health and wellness. This includes:  A yearly physical exam. This is also called an annual well check.  Regular dental and eye exams.  Immunizations.  Screening for certain conditions.  Healthy lifestyle choices, such as diet and exercise. What can I expect for my preventive care visit? Physical exam Your health care provider will check:  Height and weight. These may be used to calculate body mass index (BMI), which is a measurement that tells if you are at a healthy weight.  Heart rate and blood pressure.  Your skin for abnormal spots. Counseling Your health care provider may ask you questions about:  Alcohol, tobacco, and drug use.  Emotional well-being.  Home and relationship well-being.  Sexual activity.  Eating habits.  History of falls.  Memory and ability to understand (cognition).  Work and work Statistician. What immunizations do I need?  Influenza (flu) vaccine  This is recommended every year. Tetanus, diphtheria, and pertussis (Tdap) vaccine  You may need a Td booster every 10 years. Varicella (chickenpox) vaccine  You may need this vaccine if you have not already been vaccinated. Zoster (shingles) vaccine  You may need this after age 70. Pneumococcal conjugate (PCV13) vaccine  One dose is recommended after age 70. Pneumococcal polysaccharide (PPSV23) vaccine  One dose is recommended after age 70. Measles, mumps, and rubella (MMR) vaccine  You may need at least one dose of MMR if you were born in 1957 or later. You may also need a second dose. Meningococcal conjugate (MenACWY)  vaccine  You may need this if you have certain conditions. Hepatitis A vaccine  You may need this if you have certain conditions or if you travel or work in places where you may be exposed to hepatitis A. Hepatitis B vaccine  You may need this if you have certain conditions or if you travel or work in places where you may be exposed to hepatitis B. Haemophilus influenzae type b (Hib) vaccine  You may need this if you have certain conditions. You may receive vaccines as individual doses or as more than one vaccine together in one shot (combination vaccines). Talk with your health care provider about the risks and benefits of combination vaccines. What tests do I need? Blood tests  Lipid and cholesterol levels. These may be checked every 5 years, or more frequently depending on your overall health.  Hepatitis C test.  Hepatitis B test. Screening  Lung cancer screening. You may have this screening every year starting at age 70 if you have a 30-pack-year history of smoking and currently smoke or have quit within the past 15 years.  Colorectal cancer screening. All adults should have this screening starting at age 70 and continuing until age 77. Your health care provider may recommend screening at age 2 if you are at increased risk. You will have tests every 1-10 years, depending on your results and the type of screening test.  Prostate cancer screening. Recommendations will vary depending on your family history and other risks.  Diabetes screening. This is done by checking your blood sugar (glucose) after you have not eaten for a while (fasting).  You may have this done every 1-3 years.  Abdominal aortic aneurysm (AAA) screening. You may need this if you are a current or former smoker.  Sexually transmitted disease (STD) testing. Follow these instructions at home: Eating and drinking  Eat a diet that includes fresh fruits and vegetables, whole grains, lean protein, and low-fat dairy  products. Limit your intake of foods with high amounts of sugar, saturated fats, and salt.  Take vitamin and mineral supplements as recommended by your health care provider.  Do not drink alcohol if your health care provider tells you not to drink.  If you drink alcohol: ? Limit how much you have to 0-2 drinks a day. ? Be aware of how much alcohol is in your drink. In the [place] one drink equals one 12 oz bottle of beer (355 mL), one 5 oz glass of wine (148 mL), or one 1 oz glass of hard liquor (44 mL). Lifestyle  Take daily care of your teeth and gums.  Stay active. Exercise for at least 30 minutes on 5 or more days each week.  Do not use any products that contain nicotine or tobacco, such as cigarettes, e-cigarettes, and chewing tobacco. If you need help quitting, ask your health care provider.  If you are sexually active, practice safe sex. Use a condom or other form of protection to prevent STIs (sexually transmitted infections).  Talk with your health care provider about taking a low-dose aspirin or statin. What's next?  Visit your health care provider once a year for a well check visit.  Ask your health care provider how often you should have your eyes and teeth checked.  Stay up to date on all vaccines. This information is not intended to replace advice given to you by your health care provider. Make sure you discuss any questions you have with your health care provider. Document Revised: 01/12/2018 Document Reviewed: 01/12/2018 Elsevier Patient Education  2020 [employer].

## 2019-12-01 LAB — COMPREHENSIVE METABOLIC PANEL
AG Ratio: 1.1 (calc) (ref 1.0–2.5)
ALT: 31 U/L (ref 9–46)
AST: 50 U/L — ABNORMAL HIGH (ref 10–35)
Albumin: 3.7 g/dL (ref 3.6–5.1)
Alkaline phosphatase (APISO): 78 U/L (ref 35–144)
BUN: 11 mg/dL (ref 7–25)
CO2: 26 mmol/L (ref 20–32)
Calcium: 9.4 mg/dL (ref 8.6–10.3)
Chloride: 104 mmol/L (ref 98–110)
Creat: 0.81 mg/dL (ref 0.70–1.18)
Globulin: 3.3 g/dL (calc) (ref 1.9–3.7)
Glucose, Bld: 108 mg/dL — ABNORMAL HIGH (ref 65–99)
Potassium: 4.8 mmol/L (ref 3.5–5.3)
Sodium: 139 mmol/L (ref 135–146)
Total Bilirubin: 2.6 mg/dL — ABNORMAL HIGH (ref 0.2–1.2)
Total Protein: 7 g/dL (ref 6.1–8.1)

## 2019-12-01 LAB — LIPID PANEL
Cholesterol: 111 mg/dL (ref <200)
HDL: 44 mg/dL (ref >=40)
LDL Cholesterol (Calc): 53 mg/dL (calc)
Non-HDL Cholesterol (Calc): 67 mg/dL (calc) (ref <130)
Total CHOL/HDL Ratio: 2.5 (calc) (ref <5.0)
Triglycerides: 68 mg/dL (ref <150)

## 2019-12-01 LAB — CBC WITH DIFFERENTIAL/PLATELET
Absolute Monocytes: 390 cells/uL (ref 200–950)
Basophils Absolute: 30 cells/uL (ref 0–200)
Basophils Relative: 0.6 %
Eosinophils Absolute: 150 cells/uL (ref 15–500)
Eosinophils Relative: 3 %
HCT: 40.7 % (ref 38.5–50.0)
Hemoglobin: 13.5 g/dL (ref 13.2–17.1)
Lymphs Abs: 1345 cells/uL (ref 850–3900)
MCH: 31.5 pg (ref 27.0–33.0)
MCHC: 33.2 g/dL (ref 32.0–36.0)
MCV: 95.1 fL (ref 80.0–100.0)
MPV: 12.3 fL (ref 7.5–12.5)
Monocytes Relative: 7.8 %
Neutro Abs: 3085 cells/uL (ref 1500–7800)
Neutrophils Relative %: 61.7 %
Platelets: 91 10*3/uL — ABNORMAL LOW (ref 140–400)
RBC: 4.28 10*6/uL (ref 4.20–5.80)
RDW: 14 % (ref 11.0–15.0)
Total Lymphocyte: 26.9 %
WBC: 5 10*3/uL (ref 3.8–10.8)

## 2019-12-01 LAB — PSA: PSA: 0.33 ng/mL (ref <=4.0)

## 2019-12-01 LAB — HEMOGLOBIN A1C
Hgb A1c MFr Bld: 5.8 % of total Hgb — ABNORMAL HIGH (ref <5.7)
Mean Plasma Glucose: 120 (calc)
eAG (mmol/L): 6.6 (calc)

## 2019-12-06 ENCOUNTER — Other Ambulatory Visit: Payer: Self-pay | Admitting: Internal Medicine

## 2019-12-06 DIAGNOSIS — D696 Thrombocytopenia, unspecified: Secondary | ICD-10-CM

## 2019-12-06 NOTE — Telephone Encounter (Signed)
Form placed on Dr Hernandez's desk 

## 2019-12-07 ENCOUNTER — Telehealth: Payer: Self-pay | Admitting: Hematology and Oncology

## 2019-12-07 NOTE — Telephone Encounter (Signed)
Received a new hem referral from dr. Deniece Ree for anemia. [name] has been cld and scheduled to see [name] on 12/6 at 1pm. Pt aware to arrive 20 minutes early.

## 2019-12-07 NOTE — Telephone Encounter (Signed)
Form ready for pick up and patient is aware 

## 2019-12-11 ENCOUNTER — Other Ambulatory Visit: Payer: Self-pay | Admitting: Cardiology

## 2019-12-15 ENCOUNTER — Other Ambulatory Visit (INDEPENDENT_AMBULATORY_CARE_PROVIDER_SITE_OTHER): Payer: 59

## 2019-12-15 DIAGNOSIS — R42 Dizziness and giddiness: Secondary | ICD-10-CM

## 2019-12-18 ENCOUNTER — Other Ambulatory Visit: Payer: Self-pay | Admitting: Cardiology

## 2020-01-07 ENCOUNTER — Encounter: Payer: Self-pay | Admitting: Hematology and Oncology

## 2020-01-07 ENCOUNTER — Other Ambulatory Visit: Payer: Self-pay

## 2020-01-07 ENCOUNTER — Inpatient Hospital Stay: Payer: 59 | Attending: Hematology and Oncology | Admitting: Hematology and Oncology

## 2020-01-07 ENCOUNTER — Inpatient Hospital Stay: Payer: 59

## 2020-01-07 VITALS — BP 122/52 | HR 63 | Temp 98.7°F | Resp 16 | Ht 66.0 in | Wt 236.5 lb

## 2020-01-07 DIAGNOSIS — Z79899 Other long term (current) drug therapy: Secondary | ICD-10-CM | POA: Insufficient documentation

## 2020-01-07 DIAGNOSIS — I2510 Atherosclerotic heart disease of native coronary artery without angina pectoris: Secondary | ICD-10-CM | POA: Diagnosis not present

## 2020-01-07 DIAGNOSIS — D696 Thrombocytopenia, unspecified: Secondary | ICD-10-CM | POA: Insufficient documentation

## 2020-01-07 DIAGNOSIS — I110 Hypertensive heart disease with heart failure: Secondary | ICD-10-CM | POA: Insufficient documentation

## 2020-01-07 DIAGNOSIS — E669 Obesity, unspecified: Secondary | ICD-10-CM | POA: Diagnosis not present

## 2020-01-07 DIAGNOSIS — Z7901 Long term (current) use of anticoagulants: Secondary | ICD-10-CM | POA: Diagnosis not present

## 2020-01-07 DIAGNOSIS — Z951 Presence of aortocoronary bypass graft: Secondary | ICD-10-CM | POA: Diagnosis not present

## 2020-01-07 DIAGNOSIS — G4730 Sleep apnea, unspecified: Secondary | ICD-10-CM | POA: Insufficient documentation

## 2020-01-07 DIAGNOSIS — I509 Heart failure, unspecified: Secondary | ICD-10-CM | POA: Diagnosis not present

## 2020-01-07 LAB — CBC WITH DIFFERENTIAL (CANCER CENTER ONLY)
Abs Immature Granulocytes: 0.01 10*3/uL (ref 0.00–0.07)
Basophils Absolute: 0 10*3/uL (ref 0.0–0.1)
Basophils Relative: 1 %
Eosinophils Absolute: 0.2 10*3/uL (ref 0.0–0.5)
Eosinophils Relative: 4 %
HCT: 34.5 % — ABNORMAL LOW (ref 39.0–52.0)
Hemoglobin: 11.4 g/dL — ABNORMAL LOW (ref 13.0–17.0)
Immature Granulocytes: 0 %
Lymphocytes Relative: 27 %
Lymphs Abs: 1.1 10*3/uL (ref 0.7–4.0)
MCH: 31.2 pg (ref 26.0–34.0)
MCHC: 33 g/dL (ref 30.0–36.0)
MCV: 94.5 fL (ref 80.0–100.0)
Monocytes Absolute: 0.4 10*3/uL (ref 0.1–1.0)
Monocytes Relative: 9 %
Neutro Abs: 2.5 10*3/uL (ref 1.7–7.7)
Neutrophils Relative %: 59 %
Platelet Count: 77 10*3/uL — ABNORMAL LOW (ref 150–400)
RBC: 3.65 MIL/uL — ABNORMAL LOW (ref 4.22–5.81)
RDW: 15 % (ref 11.5–15.5)
WBC Count: 4.2 10*3/uL (ref 4.0–10.5)
nRBC: 0 % (ref 0.0–0.2)

## 2020-01-07 LAB — CMP (CANCER CENTER ONLY)
ALT: 31 U/L (ref 0–44)
AST: 47 U/L — ABNORMAL HIGH (ref 15–41)
Albumin: 3.2 g/dL — ABNORMAL LOW (ref 3.5–5.0)
Alkaline Phosphatase: 81 U/L (ref 38–126)
Anion gap: 7 (ref 5–15)
BUN: 12 mg/dL (ref 8–23)
CO2: 26 mmol/L (ref 22–32)
Calcium: 9.1 mg/dL (ref 8.9–10.3)
Chloride: 107 mmol/L (ref 98–111)
Creatinine: 0.87 mg/dL (ref 0.61–1.24)
GFR, Estimated: >60 mL/min (ref >60)
Glucose, Bld: 109 mg/dL — ABNORMAL HIGH (ref 70–99)
Potassium: 3.7 mmol/L (ref 3.5–5.1)
Sodium: 140 mmol/L (ref 135–145)
Total Bilirubin: 2 mg/dL — ABNORMAL HIGH (ref 0.3–1.2)
Total Protein: 7.1 g/dL (ref 6.5–8.1)

## 2020-01-07 LAB — HEPATITIS B SURFACE ANTIBODY,QUALITATIVE: Hep B S Ab: NONREACTIVE

## 2020-01-07 LAB — HEPATITIS B CORE ANTIBODY, TOTAL: Hep B Core Total Ab: NONREACTIVE

## 2020-01-07 LAB — HEPATITIS B SURFACE ANTIGEN: Hepatitis B Surface Ag: NONREACTIVE

## 2020-01-07 LAB — FOLATE: Folate: 7.7 ng/mL (ref >5.9)

## 2020-01-07 LAB — IMMATURE PLATELET FRACTION: Immature Platelet Fraction: 4.9 % (ref 1.2–8.6)

## 2020-01-07 LAB — SAVE SMEAR(SSMR), FOR PROVIDER SLIDE REVIEW

## 2020-01-07 LAB — PLATELET BY CITRATE

## 2020-01-07 LAB — HEPATITIS C ANTIBODY: HCV Ab: NONREACTIVE

## 2020-01-07 LAB — LACTATE DEHYDROGENASE: LDH: 228 U/L — ABNORMAL HIGH (ref 98–192)

## 2020-01-07 LAB — VITAMIN B12: Vitamin B-12: 239 pg/mL (ref 180–914)

## 2020-01-07 NOTE — Progress Notes (Signed)
[place] Telephone:([phone]) [phone]   Fax:([phone]) [place] NOTE  Patient Care Team: Isaac Bliss, Rayford Halsted, MD as PCP - General (Internal Medicine) Lelon Perla, MD as PCP - Cardiology (Cardiology)  Hematological/Oncological History # Thrombocytopenia, chronic 1) 07/11/2009: WBC 8.9, Hgb 15.1, MCV 93.7, Plt 188 2) 09/22/2010: WBC 6.3, Hgb 14.4, MCV 95.1, Plt 135 3) 08/01/2015: WBC 5.6, Hgb 12.7, MCV 90.8, Plt 86 4) 09/05/2017: WBC 4.4, Hgb 11.9, MCV 92, Plt 91 5) 11/24/2018: WBC 3.9, Hgb 12.1, MCV 91, Plt 82 6) 11/30/2019: WbC 5.0, Hgb 13.5, MCV 95.1, Plt 91 7) 01/07/2020: establish care with Dr. Lorenso Courier   CHIEF COMPLAINTS/PURPOSE OF CONSULTATION:  "Thrombocytopenia "  HISTORY OF PRESENTING ILLNESS:  [name] 69 y.o. male with medical history significant for quadruple bipass (2006), atrial fibrillation, aortic stenosis, and obesity who presents for evaluation of thrombocytopenia.   On review of the previous records [name] has thrombocytopenia dating back to at least 820 02/20/2010 at which time he had a platelet count of 135.  Prior records to that point had showed normal platelet counts.  The platelets had a slow downward trend with notable counts of 86 on 08/01/2015, 91 on 09/05/2017, and most recently 91 on 11/30/2019.  Due to concern for this patient's longstanding thrombocytopenia the patient was referred to hematology for further evaluation and management.  On exam today [name] notes that he has just learned that he has a low platelet count.  He reports that he does not have any trouble with bleeding or bruising.  He reports that he has not seen any blood in the stool, urine, and denies any overt bruising.  He does occasionally have some gum bleeding.  He is currently taking Eliquis therapy and has not had any trouble with this.  He endorses some occasional vertigo but otherwise denies fevers, chills, sweats, nausea, vomiting or  diarrhea.  On further discussion his family history is remarkable for heart disease in his mother and reports nothing else runs in his family.  He is a never smoker and only occasionally drinks a beer or mixed drink.  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  He is otherwise asymptomatic.  A full 10 point ROS is listed below.  MEDICAL HISTORY:  Past Medical History:  Diagnosis Date  . Adjustment disorder with depressed mood 09/07/2007  . Colon polyps    Tubular Adenoma 2010  . CONGESTIVE HEART FAILURE 12/09/2008  . CORONARY ARTERY DISEASE 2006  . HYPERLIPIDEMIA 08/09/2006  . HYPERTENSION 08/09/2006  . MYOCARDIAL INFARCTION, HX OF 07/07/2004  . 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 OBESITY 07/19/2008  . Sleep apnea     SURGICAL HISTORY: Past Surgical History:  Procedure Laterality Date  . CARDIOVERSION N/A 12/14/2017   Procedure: CARDIOVERSION;  Surgeon: [name], MD;  Location: [facility];  Service: Cardiovascular;  Laterality: N/A;  . CARDIOVERSION N/A 10/01/2019   Procedure: CARDIOVERSION;  Surgeon: Buford Dresser, MD;  Location[facility] ENDOSCOPY;  Service: Cardiovascular;  Laterality: N/A;  . COLONOSCOPY    . CORONARY ARTERY BYPASS GRAFT  2006    SOCIAL HISTORY: Social History   Socioeconomic History  . Marital status: Single    Spouse name: Not on file  . Number of children: Not on file  . Years of education: Not on file  . Highest education level: Not on file  Occupational History  . Not on file  Tobacco Use  . Smoking status: Never Smoker  .  Smokeless tobacco: Never Used  Substance and Sexual Activity  . Alcohol use: Yes    Alcohol/week: 1.0 standard drink    Types: 1 Cans of beer per week    Comment: occasional  . Drug use: No  . Sexual activity: Not on file  Other Topics Concern  . Not on file  Social History Narrative  . Not on file   Social Determinants of Health   Financial Resource Strain:   . Difficulty of Paying Living  Expenses: Not on file  Food Insecurity:   . Worried About Charity fundraiser in the Last Year: Not on file  . Ran Out of Food in the Last Year: Not on file  Transportation Needs:   . Lack of Transportation (Medical): Not on file  . Lack of Transportation (Non-Medical): Not on file  Physical Activity:   . Days of Exercise per Week: Not on file  . Minutes of Exercise per Session: Not on file  Stress:   . Feeling of Stress : Not on file  Social Connections:   . Frequency of Communication with Friends and Family: Not on file  . Frequency of Social Gatherings with Friends and Family: Not on file  . Attends Religious Services: Not on file  . Active Member of Clubs or Organizations: Not on file  . Attends Archivist Meetings: Not on file  . Marital Status: Not on file  Intimate Partner Violence:   . Fear of Current or Ex-Partner: Not on file  . Emotionally Abused: Not on file  . Physically Abused: Not on file  . Sexually Abused: Not on file    FAMILY HISTORY: Family History  Problem Relation Age of Onset  . Hypertension Mother   . Hyperlipidemia Mother   . Heart disease Father   . Ulcers Father   . Bipolar disorder Brother   . Colon cancer Neg Hx     ALLERGIES:  has No Known Allergies.  MEDICATIONS:  Current Outpatient Medications  Medication Sig Dispense Refill  . KLOR-CON M20 20 MEQ tablet TAKE 1 TABLET BY MOUTH EVERY DAY 90 tablet 3  . acetaminophen (TYLENOL) 500 MG tablet Take 1,000 mg by mouth daily as needed for moderate pain or headache.    Marland Kitchen atorvastatin (LIPITOR) 80 MG tablet TAKE 1 TABLET BY MOUTH EVERY DAY (Patient taking differently: Take 80 mg by mouth daily. ) 90 tablet 1  . ELIQUIS 5 MG TABS tablet TAKE 1 TABLET BY MOUTH TWICE A DAY (Patient taking differently: Take 5 mg by mouth 2 (two) times daily. ) 60 tablet 6  . furosemide (LASIX) 20 MG tablet TAKE 1 TABLET BY MOUTH EVERY DAY 30 tablet 11  . hydrocortisone 2.5 % cream Apply topically.    .  metoprolol succinate (TOPROL-XL) 25 MG 24 hr tablet TAKE 1 TABLET BY MOUTH EVERY DAY WITH OR IMMEDIATELY FOLLOWING A MEAL 30 tablet 11  . tamsulosin (FLOMAX) 0.4 MG CAPS capsule TAKE ONE CAPSULE BY MOUTH EVERY DAY AFTER SUPPER (Patient taking differently: Take 0.4 mg by mouth daily after supper. ) 90 capsule 1   No current facility-administered medications for this visit.    REVIEW OF SYSTEMS:   Constitutional: ( - ) fevers, ( - )  chills , ( - ) night sweats Eyes: ( - ) blurriness of vision, ( - ) double vision, ( - ) watery eyes Ears, nose, mouth, throat, and face: ( - ) mucositis, ( - ) sore throat Respiratory: ( - ) cough, ( - )  dyspnea, ( - ) wheezes Cardiovascular: ( - ) palpitation, ( - ) chest discomfort, ( - ) lower extremity swelling Gastrointestinal:  ( - ) nausea, ( - ) heartburn, ( - ) change in bowel habits Skin: ( - ) abnormal skin rashes Lymphatics: ( - ) new lymphadenopathy, ( - ) easy bruising Neurological: ( - ) numbness, ( - ) tingling, ( - ) new weaknesses Behavioral/Psych: ( - ) mood change, ( - ) new changes  All other systems were reviewed with the patient and are negative.  PHYSICAL EXAMINATION:  Vitals:   01/07/20 1328  BP: (!) 122/52  Pulse: 63  Resp: 16  Temp: 98.7 F (37.1 C)  SpO2: 97%   Filed Weights   01/07/20 1328  Weight: 236 lb 8 oz (107.3 kg)    GENERAL: well appearing obese elderly Caucasian male in NAD  SKIN: skin color, texture, turgor are normal, no rashes or significant lesions EYES: conjunctiva are pink and non-injected, sclera clear LUNGS: clear to auscultation and percussion with normal breathing effort HEART: regular rate & rhythm and no murmurs and no lower extremity edema ABDOMEN: exam limited 2/2 to body habitus.  Musculoskeletal: no cyanosis of digits and no clubbing  PSYCH: alert & oriented x 3, fluent speech NEURO: no focal motor/sensory deficits  LABORATORY DATA:  I have reviewed the data as listed CBC Latest Ref Rng &  Units 11/30/2019 07/23/2019 11/24/2018  WBC 3.8 - 10.8 Thousand/uL 5.0 3.9 5.0  Hemoglobin 13.2 - 17.1 g/dL 13.5 12.1(L) 13.3  Hematocrit 38 - 50 % 40.7 35.7(L) 40.4  Platelets 140 - 400 Thousand/uL 91(L) 82(LL) 90(LL)    CMP Latest Ref Rng & Units 11/30/2019 11/22/2019 09/21/2019  Glucose 65 - 99 mg/dL 108(H) 116(H) 115(H)  BUN 7 - 25 mg/dL _0 Creatinine 0.70 - 1.18 mg/dL 0.81 0.87 0.82  Sodium 135 - 146 mmol/L 139 142 142  Potassium 3.5 - 5.3 mmol/L 4.8 5.3(H) 5.1  Chloride 98 - 110 mmol/L 104 105 105  CO2 20 - 32 mmol/L _1 Calcium 8.6 - 10.3 mg/dL 9.4 9.3 9.6  Total Protein 6.1 - 8.1 g/dL 7.0 - -  Total Bilirubin 0.2 - 1.2 mg/dL 2.6(H) - -  Alkaline Phos 48 - 121 IU/L - - -  AST 10 - 35 U/L 50(H) - -  ALT 9 - 46 U/L 31 - -    RADIOGRAPHIC STUDIES: LONG TERM MONITOR (3-14 DAYS)  Result Date: 01/02/2020 Sinus bradycardia, normal sinus rhythm, rare PAC and PVC. Kirk Ruths    ASSESSMENT & PLAN [name] 69 y.o. male with medical history significant for quadruple bipass (2006), atrial fibrillation, aortic stenosis, and obesity who presents for evaluation of thrombocytopenia.  After review the labs, the records, schedule the patient the findings are most consistent with chronic worsening thrombocytopenia.  The patient's thrombocytopenia dates back to at least 2012.  The differential for this includes pseudothrombocytopenia, liver disease, ITP, or bone marrow dysfunction/nutritional deficiency.  At this time I have a strong suspicion of liver disease given his mild LFT elevations and history of obesity.  We will do extensive work-up including blood smear, citrated platelets, and immature platelet fraction to look for pseudothrombocytopenia.  Additionally we will order labs to look at liver health.  In the event no clear etiology is found we would recommend performing an ultrasound of the liver and spleen.  If these remain unrevealing we could consider further  evaluation up to and including bone  marrow biopsy.  In the event liver disease is found I would recommend that we refer the patient back to Dr. Havery Moros who has performed his colonoscopies.  The patient voices understanding of our plan and the work-up moving forward.  #Thrombocytopenia, chronic --differential includes pseudothrombocytopenia, liver disease, ITP, or bone marrow dysfunction/nutritional deficiency. --at this time favor liver disease given his mild LFT elevations and obesity. Possible NAFLD --will order liver labs to include Hep B, C, LDH, and CMP --assess for platelet clumping with citrate, immature plt fraction, and peripheral smear --if no clear etiology noted above recommend proceeding with US liver/spleen. --patient followed with Dr. Havery Moros in GI. Consider referral to [facility] if liver disease is suspected. --RTC in 3 months or sooner if further workup is required.   No orders of the defined types were placed in this encounter.   All questions were answered. The patient knows to call the clinic with any problems, questions or concerns.  A total of more than 60 minutes were spent on this encounter and over half of that time was spent on counseling and coordination of care as outlined above.   Ledell Peoples, MD Department of Hematology/Oncology [place] [facility] Phone: [phone] Pager: [phone] Email: [email or web address]  01/07/2020 1:38 PM

## 2020-01-09 ENCOUNTER — Telehealth: Payer: Self-pay | Admitting: Hematology and Oncology

## 2020-01-09 NOTE — Telephone Encounter (Signed)
Scheduled per los. Called and left msg. Mailed printout  °

## 2020-01-17 ENCOUNTER — Ambulatory Visit: Payer: 59 | Admitting: Cardiology

## 2020-01-17 ENCOUNTER — Ambulatory Visit: Payer: 59 | Admitting: Cardiovascular Disease

## 2020-02-04 NOTE — Progress Notes (Signed)
Virtual Visit via Video Note   This visit type was conducted due to national recommendations for restrictions regarding the COVID-19 Pandemic (e.g. social distancing) in an effort to limit this patient's exposure and mitigate transmission in our community.  Due to his co-morbid illnesses, this patient is at least at moderate risk for complications without adequate follow up.  This format is felt to be most appropriate for this patient at this time.  All issues noted in this document were discussed and addressed.  A limited physical exam was performed with this format.  Please refer to the patient's chart for his consent to telehealth for [facility].      Date:  02/18/2020   ID:  [name], DOB 1950-05-30, MRN [id number]  Patient Location:Home Provider Location: Home  PCP:  Philip Aspen, Limmie Patricia, MD  Cardiologist:  Dr Jens Som  Evaluation Performed:  Follow-Up Visit  Chief Complaint:  FU CAD and atrial fibrillation  History of Present Illness:    FU CAD, ASand atrial flutter/fibrillation; history of coronary artery disease, status post coronary artery bypass graft surgery performed in June 2006. Patient had a LIMA to the LAD, saphenous vein graft to the diagonal, saphenous vein graft to the acute marginal and saphenous vein graft to the PDA. Carotid Dopplers August 2015 showed no significant stenosis. Nuclear study August 2015 showed ejection fraction 55% but no ischemia or infarction. Possible mild decrease in systolic blood pressure at peak exercise.Abdominal ultrasound June 2017 showed no aneurysm.Patient seen8/19with newly diagnosed atrial fibrillation. He was volume overloaded and was diuresed with Lasix.Had DCCV of atypical atrial flutter 11/19.Echocardiogram May 2021showed normal LV function, grade 3 DD,moderate biatrial enlargement, mild to moderate aortic stenosis withmean gradient . At last office visit patient noted to be in recurrent atrial  fibrillation.Patient subsequently underwent successful cardioversion on October 01, 2019.  We also had plan to have patient evaluated in atrial fibrillation clinic for consideration of Tikosyn.  Monitor December 2021 showed sinus rhythm with rare PAC and PVC. Since last seenhe denies increased dyspnea, chest pain, palpitations or syncope.  He has chronic pedal edema unchanged.   The patient does not have symptoms concerning for COVID-19 infection (fever, chills, cough, or new shortness of breath).    Past Medical History:  Diagnosis Date  . Adjustment disorder with depressed mood 09/07/2007  . Colon polyps    Tubular Adenoma 2010  . CONGESTIVE HEART FAILURE 12/09/2008  . CORONARY ARTERY DISEASE 2006  . HYPERLIPIDEMIA 08/09/2006  . HYPERTENSION 08/09/2006  . MYOCARDIAL INFARCTION, HX OF 07/07/2004  . 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 OBESITY 07/19/2008  . Sleep apnea    Past Surgical History:  Procedure Laterality Date  . CARDIOVERSION N/A 12/14/2017   Procedure: CARDIOVERSION;  Surgeon: Chilton Si, MD;  Location: [facility] ENDOSCOPY;  Service: Cardiovascular;  Laterality: N/A;  . CARDIOVERSION N/A 10/01/2019   Procedure: CARDIOVERSION;  Surgeon: Jodelle Red, MD;  Location: [facility] ENDOSCOPY;  Service: Cardiovascular;  Laterality: N/A;  . COLONOSCOPY    . CORONARY ARTERY BYPASS GRAFT  2006     Current Meds  Medication Sig  . acetaminophen (TYLENOL) 500 MG tablet Take 1,000 mg by mouth daily as needed for moderate pain or headache.  Marland Kitchen atorvastatin (LIPITOR) 80 MG tablet TAKE 1 TABLET BY MOUTH EVERY DAY (Patient taking differently: Take 80 mg by mouth daily.)  . ELIQUIS 5 MG TABS tablet TAKE 1 TABLET BY MOUTH TWICE A DAY  . furosemide (LASIX) 20 MG tablet TAKE 1 TABLET BY  MOUTH EVERY DAY  . hydrocortisone 2.5 % cream Apply topically.  Marland Kitchen KLOR-CON M20 20 MEQ tablet TAKE 1 TABLET BY MOUTH EVERY DAY  . metoprolol succinate (TOPROL-XL) 25 MG 24 hr tablet TAKE 1 TABLET BY MOUTH EVERY  DAY WITH OR IMMEDIATELY FOLLOWING A MEAL  . tamsulosin (FLOMAX) 0.4 MG CAPS capsule TAKE ONE CAPSULE BY MOUTH EVERY DAY AFTER SUPPER (Patient taking differently: Take 0.4 mg by mouth daily after supper.)     Allergies:   Patient has no known allergies.   Social History   Tobacco Use  . Smoking status: Never Smoker  . Smokeless tobacco: Never Used  Substance Use Topics  . Alcohol use: Yes    Alcohol/week: 1.0 standard drink    Types: 1 Cans of beer per week    Comment: occasional  . Drug use: No     Family Hx: The patient's family history includes Bipolar disorder in his brother; Heart disease in his father; Hyperlipidemia in his mother; Hypertension in his mother; Ulcers in his father. There is no history of Colon cancer.  ROS:   Please see the history of present illness.    No Fever, chills  or productive cough All other systems reviewed and are negative.  Recent Labs: 01/07/2020: ALT 31; BUN 12; Creatinine 0.87; Hemoglobin 11.4; Platelet Count 77; Potassium 3.7; Sodium 140   Recent Lipid Panel Lab Results  Component Value Date/Time   CHOL 111 11/30/2019 09:29 AM   CHOL 111 07/23/2019 11:44 AM   TRIG 68 11/30/2019 09:29 AM   HDL 44 11/30/2019 09:29 AM   HDL 48 07/23/2019 11:44 AM   CHOLHDL 2.5 11/30/2019 09:29 AM   LDLCALC 53 11/30/2019 09:29 AM   LDLDIRECT 198.2 07/11/2009 09:25 AM    Wt Readings from Last 3 Encounters:  02/18/20 235 lb (106.6 kg)  01/07/20 236 lb 8 oz (107.3 kg)  11/30/19 228 lb 9.6 oz (103.7 kg)     Objective:    Vital Signs:  BP 129/62   Pulse 71   Ht 5\' 6"  (1.676 m)   Wt 235 lb (106.6 kg)   BMI 37.93 kg/m    VITAL SIGNS:  reviewed NAD Answers questions appropriately Normal affect Remainder of physical examination not performed (telehealth visit; coronavirus pandemic)  ASSESSMENT & PLAN:    1 paroxysmal atrial fibrillation-patient remains in sinus rhythm.  Continue apixaban.  If he has more frequent episodes in the future we will  consider referral for Tikosyn.  2 history of aortic stenosis-plan follow-up study May 2022.  3 coronary artery disease-no chest pain.  Continue statin.  No aspirin given need for apixaban.  4 chronic combined systolic/diastolic congestive heart failure-patient appears to be doing reasonably well from a volume standpoint.  Continue Lasix at present dose.   5 hypertension-patient's blood pressure is controlled.  Continue present medications.  6 hyperlipidemia-continue statin.   COVID-19 Education: The importance of social distancing was discussed today.  Time:   Today, I have spent 16 minutes with the patient with telehealth technology discussing the above problems.     Medication Adjustments/Labs and Tests Ordered: Current medicines are reviewed at length with the patient today.  Concerns regarding medicines are outlined above.   Tests Ordered: No orders of the defined types were placed in this encounter.   Medication Changes: No orders of the defined types were placed in this encounter.   Follow Up:  In Person in 6 month(s)  Signed, Kirk Ruths, MD  02/18/2020 1:18 PM  [facility]

## 2020-02-12 ENCOUNTER — Other Ambulatory Visit: Payer: Self-pay | Admitting: Cardiology

## 2020-02-12 NOTE — Telephone Encounter (Signed)
Prescription refill request for Eliquis received. Indication:atrial flutter Last office visit:10/21 [place] Scr:0.87  12/21 Age: 70 Weight:107.3 kg  Prescription refilled

## 2020-02-14 ENCOUNTER — Encounter: Payer: Self-pay | Admitting: *Deleted

## 2020-02-15 ENCOUNTER — Telehealth: Payer: Self-pay | Admitting: Cardiology

## 2020-02-18 ENCOUNTER — Telehealth (INDEPENDENT_AMBULATORY_CARE_PROVIDER_SITE_OTHER): Payer: 59 | Admitting: Cardiology

## 2020-02-18 ENCOUNTER — Encounter: Payer: Self-pay | Admitting: Cardiology

## 2020-02-18 VITALS — BP 129/62 | HR 71 | Ht 66.0 in | Wt 235.0 lb

## 2020-02-18 DIAGNOSIS — I5042 Chronic combined systolic (congestive) and diastolic (congestive) heart failure: Secondary | ICD-10-CM | POA: Diagnosis not present

## 2020-02-18 DIAGNOSIS — I2510 Atherosclerotic heart disease of native coronary artery without angina pectoris: Secondary | ICD-10-CM | POA: Diagnosis not present

## 2020-02-18 DIAGNOSIS — I10 Essential (primary) hypertension: Secondary | ICD-10-CM | POA: Diagnosis not present

## 2020-02-18 DIAGNOSIS — I350 Nonrheumatic aortic (valve) stenosis: Secondary | ICD-10-CM

## 2020-02-18 NOTE — Patient Instructions (Signed)
  Testing/Procedures:  Your physician has requested that you have an echocardiogram. Echocardiography is a painless test that uses sound waves to create images of your heart. It provides your doctor with information about the size and shape of your heart and how well your heart's chambers and valves are working. This procedure takes approximately one hour. There are no restrictions for this procedure.  [place] MAY   Follow-Up: At [facility], you and your health needs are our priority.  As part of our continuing mission to provide you with exceptional heart care, we have created designated Provider Care Teams.  These Care Teams include your primary Cardiologist (physician) and Advanced Practice Providers (APPs -  Physician Assistants and Nurse Practitioners) who all work together to provide you with the care you need, when you need it.  We recommend signing up for the patient portal called "MyChart".  Sign up information is provided on this After Visit Summary.  MyChart is used to connect with patients for Virtual Visits (Telemedicine).  Patients are able to view lab/test results, encounter notes, upcoming appointments, etc.  Non-urgent messages can be sent to your provider as well.   To learn more about what you can do with MyChart, go to [email or web address].    Your next appointment:   6 month(s)  The format for your next appointment:   In Person  Provider:   Kirk Ruths, MD

## 2020-03-09 ENCOUNTER — Other Ambulatory Visit: Payer: Self-pay | Admitting: Internal Medicine

## 2020-03-18 ENCOUNTER — Other Ambulatory Visit: Payer: Self-pay | Admitting: Internal Medicine

## 2020-04-07 ENCOUNTER — Ambulatory Visit: Payer: 59 | Admitting: Hematology and Oncology

## 2020-04-07 ENCOUNTER — Other Ambulatory Visit: Payer: 59

## 2020-04-11 ENCOUNTER — Telehealth: Payer: Self-pay | Admitting: Hematology and Oncology

## 2020-04-11 NOTE — Telephone Encounter (Signed)
R/s 3/21 appt due to Provider being on [place] and left detailed msg. Mailed printout for new appt date and time

## 2020-04-14 ENCOUNTER — Ambulatory Visit: Payer: 59 | Admitting: Cardiovascular Disease

## 2020-04-21 ENCOUNTER — Ambulatory Visit: Payer: 59 | Admitting: Hematology and Oncology

## 2020-04-21 ENCOUNTER — Other Ambulatory Visit: Payer: 59

## 2020-04-23 ENCOUNTER — Telehealth: Payer: Self-pay | Admitting: Hematology and Oncology

## 2020-04-23 NOTE — Telephone Encounter (Signed)
R/s 3/28 appt per patient request. Called and spoke with patient. Confirmed new date and time  

## 2020-04-28 ENCOUNTER — Other Ambulatory Visit: Payer: 59

## 2020-04-28 ENCOUNTER — Ambulatory Visit: Payer: 59 | Admitting: Hematology and Oncology

## 2020-05-26 ENCOUNTER — Ambulatory Visit: Payer: 59 | Admitting: Hematology and Oncology

## 2020-05-26 ENCOUNTER — Other Ambulatory Visit: Payer: 59

## 2020-05-28 ENCOUNTER — Telehealth: Payer: Self-pay | Admitting: Hematology and Oncology

## 2020-05-28 NOTE — Telephone Encounter (Signed)
Scheduled appts per 4/22 sch msg. Called pt, no answer and no voicemail available. Mailed updated calendar to pt.

## 2020-05-30 ENCOUNTER — Encounter: Payer: Self-pay | Admitting: Gastroenterology

## 2020-06-02 ENCOUNTER — Ambulatory Visit: Payer: 59 | Admitting: Cardiovascular Disease

## 2020-06-06 ENCOUNTER — Other Ambulatory Visit: Payer: Self-pay

## 2020-06-06 ENCOUNTER — Ambulatory Visit (HOSPITAL_COMMUNITY): Payer: 59 | Attending: Internal Medicine

## 2020-06-06 DIAGNOSIS — I350 Nonrheumatic aortic (valve) stenosis: Secondary | ICD-10-CM | POA: Insufficient documentation

## 2020-06-06 LAB — ECHOCARDIOGRAM COMPLETE
AR max vel: 0.86 cm2
AV Area VTI: 0.85 cm2
AV Area mean vel: 0.8 cm2
AV Mean grad: 32 mmHg
AV Peak grad: 55.1 mmHg
Ao pk vel: 3.71 m/s
Area-P 1/2: 2.83 cm2
S' Lateral: 4.3 cm

## 2020-06-06 MED ORDER — PERFLUTREN LIPID MICROSPHERE
1.0000 mL | INTRAVENOUS | Status: AC | PRN
  Administered 2020-06-06: 1 mL via INTRAVENOUS

## 2020-06-09 ENCOUNTER — Other Ambulatory Visit: Payer: Self-pay

## 2020-06-09 ENCOUNTER — Encounter: Payer: Self-pay | Admitting: Student

## 2020-06-09 ENCOUNTER — Telehealth: Payer: Self-pay | Admitting: *Deleted

## 2020-06-09 ENCOUNTER — Ambulatory Visit: Payer: 59 | Admitting: Student

## 2020-06-09 ENCOUNTER — Telehealth: Payer: Self-pay | Admitting: Cardiology

## 2020-06-09 VITALS — BP 136/58 | HR 72 | Ht 66.0 in | Wt 239.8 lb

## 2020-06-09 DIAGNOSIS — R0609 Other forms of dyspnea: Secondary | ICD-10-CM

## 2020-06-09 DIAGNOSIS — I5032 Chronic diastolic (congestive) heart failure: Secondary | ICD-10-CM | POA: Diagnosis not present

## 2020-06-09 DIAGNOSIS — I350 Nonrheumatic aortic (valve) stenosis: Secondary | ICD-10-CM

## 2020-06-09 DIAGNOSIS — R600 Localized edema: Secondary | ICD-10-CM

## 2020-06-09 DIAGNOSIS — I10 Essential (primary) hypertension: Secondary | ICD-10-CM

## 2020-06-09 DIAGNOSIS — E785 Hyperlipidemia, unspecified: Secondary | ICD-10-CM

## 2020-06-09 DIAGNOSIS — I480 Paroxysmal atrial fibrillation: Secondary | ICD-10-CM

## 2020-06-09 DIAGNOSIS — R0600 Dyspnea, unspecified: Secondary | ICD-10-CM | POA: Diagnosis not present

## 2020-06-09 DIAGNOSIS — I2510 Atherosclerotic heart disease of native coronary artery without angina pectoris: Secondary | ICD-10-CM

## 2020-06-09 NOTE — Telephone Encounter (Signed)
Spoke to patient . Patient was very concerned about  The echo report  He had on Friday.Patient states he read report and he was calling into to find out the next step.    patient also sent a mychart message today.  RN  Informed patient of Dr Stanford Breed result note information-  Dr Stanford Breed would like patient to have a follow up elective appointment to discuss findings.   Patient is aware will contact with the next available appointment

## 2020-06-09 NOTE — Telephone Encounter (Signed)
Spoke with pt, Aware of dr Jacalyn Lefevre recommendations. Follow up scheduled   Patient reports weight gain of 10 to 12 lbs in the last 6 weeks. He reports edema up to his knees that is there all the time. He reports SOB at rest and orthopnea. He reports feeling fluttering in his chest. He has doubled his furosemide for the last 10 days and it is helping some but he continues to have problems. He will come to see the extender this afternoon.

## 2020-06-09 NOTE — Telephone Encounter (Signed)
-----   Message from Lelon Perla, MD sent at 06/09/2020  7:17 AM EDT ----- AS is moderate to severe; make sure pt has elective fuov Kirk Ruths

## 2020-06-09 NOTE — Progress Notes (Signed)
Cardiology Office Note:    Date:  06/09/2020   ID:  [name], DOB 09-Dec-1950, MRN [id number]  PCP:  Isaac Bliss, Rayford Halsted, MD  Cardiologist:  Kirk Ruths, MD  Electrophysiologist:  None   Referring MD: Isaac Bliss, Estel*   Chief Complaint: shortness of breath and lower extremity  History of Present Illness:    [name] is a 70 y.o. male with a history of CAD s/p CABG x4 (LIMA to LAD, SVG to Diag, SVG to OM, SVG to PDA) in 07/2004, moderate to severe aortic stenosis on most recent Echo on 06/06/2020, atrial fibrillation/flutter s/p DCCV in 12/2017 and again in 09/2019 on Eliquis, chronic diastolic CHF with EF of 48-54% on most recent Echo on 06/06/2020, chronic lower extremity edema,  hypertension, hyperlipidemia, obstructive sleep apnea, and obesity who is followed by Dr. Stanford Breed and presents today for evaluation of shortness of breath and lower extremity edema.  Patient has a significant cardiac history. He has known CAD with remote CABG x4 in 07/2004. Most recent ischemic evaluation was a Myoview in 09/2013 which was low risk with no evidence of ischemia but possible mild decrease in systolic BP at peak exercise. Patient was noted to have new onset atrial fibrillation in 09/2017 and was then later found to have atrial flutter as well. Ultimately underwent DCCV for atypical atrial flutter in 12/2017. Echo in 06/2019 showed LVEF of 55-60% with normal wall motion, grade 3 diastolic dysfunction, and mild to moderate AS. He has chronic lower extremity edema managed with Lasix. He was noted to have recurrent atrial fibrillation/flutter at office visit in 09/2019 and underwent DCCV later that month. There were also discussion about having patient evaluated in the [facility] for consideration of Tikosyn but this never happened. However, monitor in 01/2020 showed underlying sinus rhythm with rate PACs/PVCs. He was last seen by Dr. Stanford Breed for a virtual visit in 02/2020 at  which time he noted unchanged chronic lower extremity edema and stable dyspnea. Patient had repeat Echo on 06/06/2020 for routine monitoring of AS which showed LVEF of 60-65% with severe hypokinesis of the mid-apical anterior wall and apical segment, severely dilated left atrial size, and moderate to severe aortic stenosis with mean gradient of 32.0 mmHg and VTI of 0.85 cm^2.  Patient called our office today very concerned about Echo report as well as reports of weight gain, shortness of breath, and lower extremity edema and therefore was added onto my schedule for today.   Patient reports worsening lower extremity edema recently extending up past his knees as well as some worsening dyspnea mostly with exertion but will also notice getting more short of breath even if he just talks for long periods of time. No orthopnea or PND though. Weight up 11 pounds since last in person office visit in October. Patient states he has doubled his Lasix for the past 10 days which seems to help control the lower extremity edema more. We have Lasix 20mg  daily listed under home medications but patient states he thinks he is actually taking 40mg  twice daily which would be 4 times the usual dose. However, patient thought his prescription said to take it twice daily but he is not entirely sure. Advised patient to go home and double check this and let us know. No chest pain. He notes occasional brief palpitations that he describes a a "flutter" and sounds consistent with known PACs/PVCs. No sustained palpitations. He note chronic lightheadedness/dizziness with ambulation. He states he always gets  dizziness when walking across the pavement but does fine while golfing or walking a good ways to his mailbox. He states he has to use a cane to help. Upon further questioning, sounds like it may be more balance issues than true dizziness. No syncope.  Past Medical History:  Diagnosis Date  . Adjustment disorder with depressed mood 09/07/2007   . Colon polyps    Tubular Adenoma 2010  . CONGESTIVE HEART FAILURE 12/09/2008  . CORONARY ARTERY DISEASE 2006  . HYPERLIPIDEMIA 08/09/2006  . HYPERTENSION 08/09/2006  . MYOCARDIAL INFARCTION, HX OF 07/07/2004  . 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 OBESITY 07/19/2008  . Sleep apnea     Past Surgical History:  Procedure Laterality Date  . CARDIOVERSION N/A 12/14/2017   Procedure: CARDIOVERSION;  Surgeon: [name], MD;  Location: [facility];  Service: Cardiovascular;  Laterality: N/A;  . CARDIOVERSION N/A 10/01/2019   Procedure: CARDIOVERSION;  Surgeon: Buford Dresser, MD;  Location: [facility] ENDOSCOPY;  Service: Cardiovascular;  Laterality: N/A;  . COLONOSCOPY    . CORONARY ARTERY BYPASS GRAFT  2006    Current Medications: Current Meds  Medication Sig  . acetaminophen (TYLENOL) 500 MG tablet Take 1,000 mg by mouth daily as needed for moderate pain or headache.  Marland Kitchen atorvastatin (LIPITOR) 80 MG tablet TAKE 1 TABLET BY MOUTH EVERY DAY  . ELIQUIS 5 MG TABS tablet TAKE 1 TABLET BY MOUTH TWICE A DAY  . furosemide (LASIX) 20 MG tablet TAKE 1 TABLET BY MOUTH EVERY DAY  . hydrocortisone 2.5 % cream Apply topically.  Marland Kitchen KLOR-CON M20 20 MEQ tablet TAKE 1 TABLET BY MOUTH EVERY DAY  . metoprolol succinate (TOPROL-XL) 25 MG 24 hr tablet TAKE 1 TABLET BY MOUTH EVERY DAY WITH OR IMMEDIATELY FOLLOWING A MEAL  . tamsulosin (FLOMAX) 0.4 MG CAPS capsule TAKE ONE CAPSULE BY MOUTH EVERY DAY AFTER SUPPER     Allergies:   Patient has no known allergies.   Social History   Socioeconomic History  . Marital status: Single    Spouse name: Not on file  . Number of children: Not on file  . Years of education: Not on file  . Highest education level: Not on file  Occupational History  . Not on file  Tobacco Use  . Smoking status: Never Smoker  . Smokeless tobacco: Never Used  Substance and Sexual Activity  . Alcohol use: Yes    Alcohol/week: 1.0 standard drink    Types: 1 Cans of beer per  week    Comment: occasional  . Drug use: No  . Sexual activity: Not on file  Other Topics Concern  . Not on file  Social History Narrative  . Not on file   Social Determinants of Health   Financial Resource Strain: Not on file  Food Insecurity: Not on file  Transportation Needs: Not on file  Physical Activity: Not on file  Stress: Not on file  Social Connections: Not on file     Family History: The patient's family history includes Bipolar disorder in his brother; Heart disease in his father; Hyperlipidemia in his mother; Hypertension in his mother; Ulcers in his father. There is no history of Colon cancer.  ROS:   Please see the history of present illness.     EKGs/Labs/Other Studies Reviewed:    The following studies were reviewed today:  7 Day Zio Monitor 12/2019: Sinus bradycardia, normal sinus rhythm, rare PAC and PVC. _______________  Echocardiogram 06/06/2020: Impressions: 1. Left ventricular ejection fraction, by estimation, is  60 to 65%. The  left ventricle has normal function. The left ventricle demonstrates  regional wall motion abnormalities (see scoring diagram/findings for  description). Left ventricular diastolic  parameters are consistent with Grade III diastolic dysfunction  (restrictive). Elevated left ventricular end-diastolic pressure. The E/e'  is 32. There is severe hypokinesis of the left ventricular, mid-apical  anterior wall and apical segment. The average  left ventricular global longitudinal strain is -19.1 %. The global  longitudinal strain is abnormal.  2. Right ventricular systolic function is normal. The right ventricular  size is normal. There is normal pulmonary artery systolic pressure. The  estimated right ventricular systolic pressure is [id number] mmHg.  3. Left atrial size was severely dilated.  4. The mitral valve is abnormal. Trivial mitral valve regurgitation.  5. The aortic valve is calcified. There is severe calcifcation of the   aortic valve. There is moderate thickening of the aortic valve. Aortic  valve regurgitation is not visualized. Moderate to severe aortic valve  stenosis. Aortic valve area, by VTI  measures 0.85 cm. Aortic valve mean gradient measures 32.0 mmHg. Aortic  valve Vmax measures 3.71 m/s. DI is 0.27.  6. The inferior vena cava is normal in size with <50% respiratory  variability, suggesting right atrial pressure of 8 mmHg.   Comparison(s): Changes from prior study are noted. 07/02/2019: LVEF 55-60%, grade 3 DD mild to moderate AS - mean gradient 18 mmHg.    EKG:  EKG ordered today. EKG personally reviewed and demonstrates normal sinus rhythm, rate 72 bpm, with 1st degree AV block (PR interval of 288 ms), RBBB, LAFB, LVH, and T wave inversions in leads aVL and V2. No significant changes compared to prior tracings although PR interval has lengthened.  Recent Labs: 01/07/2020: ALT 31; BUN 12; Creatinine 0.87; Hemoglobin 11.4; Platelet Count 77; Potassium 3.7; Sodium 140  Recent Lipid Panel    Component Value Date/Time   CHOL 111 11/30/2019 0929   CHOL 111 07/23/2019 1144   TRIG 68 11/30/2019 0929   HDL 44 11/30/2019 0929   HDL 48 07/23/2019 1144   CHOLHDL 2.5 11/30/2019 0929   VLDL 24.2 07/04/2017 1535   LDLCALC 53 11/30/2019 0929   LDLDIRECT 198.2 07/11/2009 0925    Physical Exam:    Vital Signs: BP (!) 136/58 (BP Location: Right Arm, Patient Position: Sitting, Cuff Size: Large)   Pulse 72   Ht 5\' 6"  (1.676 m)   Wt 239 lb 12.8 oz (108.8 kg)   BMI 38.70 kg/m     Wt Readings from Last 3 Encounters:  06/09/20 239 lb 12.8 oz (108.8 kg)  02/18/20 235 lb (106.6 kg)  01/07/20 236 lb 8 oz (107.3 kg)     General: 70 y.o. male in no acute distress. HEENT: Normocephalic and atraumatic. Sclera clear.  Neck: Supple. Bilateral carotid bruits (suspect radiation from aortic stenosis murmur). Possible JVD. Heart: RRR. III/VI systolic murmur heard throughout but loudest at the upper sternal  border. No  gallops or rubs. Radial pulses 2+ and equal bilaterally. Lungs: No increased work of breathing. Clear to ausculation bilaterally. No wheezes, rhonchi, or rales.  Abdomen: Soft, non-distended, and non-tender to palpation.  Extremities: 2-3+ pitting edema of bilateral lower extremities. .    Skin: Warm and dry. Neuro: Alert and oriented x3. No focal deficits. Psych: Normal affect. Responds appropriately.   Assessment:    1. Dyspnea on exertion   2. Bilateral lower extremity edema   3. Chronic diastolic CHF (congestive heart failure) ([place])  4. Coronary artery disease involving native coronary artery of native heart without angina pectoris   5. Paroxysmal atrial fibrillation/flutter   6. Aortic valve stenosis, etiology of cardiac valve disease unspecified   7. Primary hypertension   8. Hyperlipidemia, unspecified hyperlipidemia type     Plan:    Dyspnea on Exertion Chronic Lower Extremity Edema Possible Acute on Chronic Diastolic CHF - Patient reports worsening chronic lower extremity edema as well as some dyspnea on exertion lately. No orthopnea or PND though.  - Recent Echo last week showed LVEF of 60-65% with severe hypokinesis of the mid-apical anterior wall and apical segment, severely dilated left atrial size, and moderate to severe aortic stenosis. - Patient does significant lower extremity edema (chronic issue but worse than usual). However, lungs clear on exam. Ambulated patient in the office and O2 sats remained >90% on room air. - Patient has been doubling up on his home Lasix dose for the past 10 days which has helped some. Patient unclear about exactly how much he is taking. We have Lasix 20mg  daily in our system but patient think he is taking 40mg  twice daily. Asked patient to go home and check to confirm what dose he is taking before I make any additional changes. - Will check CBC, BNP, and BMET today.  - Discussed importance of daily weight and sodium/fluid  restrictions. Also recommended compression stocking and elevating legs for lower extremity edema.   CAD - S/p remote CABG x4 in 2006.  - No chest pain.  - No aspirin due to need for DOAC.  - Continue beta-blocker and high-intensity statin.   Paroxysmal Atrial Fibrillation/Flutter - Maintaining sinus rhythm. - Continue Toprol-XL 25mg  daily.  - Continue chronic anticoagulation with Eliquis 5mg  twice daily.   Aortic Stenosis  - Recent Echo on 06/06/2020 showed moderate to severe aortic stenosis with mean gradient of 32.0 mmHg and VTI of 0.85 cm^2. Progressed from mild to moderate from Echo in 06/2019. Reviewed results of most recent Echo with patient.  - Discussed with Dr. Stanford Breed who will plan to see patient in about 8-12 weeks and discuss further evaluation of this at that time.   Hypertension - BP relatively well controlled. - Continue Toprol-XL 25mg  daily.   Hyperlipidemia - Continue Lipitor 80mg  daily.   Disposition: Follow up with me in 3-4 weeks and then with Dr. Stanford Breed in 8-12 weeks.    Medication Adjustments/Labs and Tests Ordered: Current medicines are reviewed at length with the patient today.  Concerns regarding medicines are outlined above.  Orders Placed This Encounter  Procedures  . Brain natriuretic peptide  . CBC  . Basic metabolic panel  . EKG 12-Lead   No orders of the defined types were placed in this encounter.   Patient Instructions  Medication Instructions:  No changes for now (please mychart [place] what medications you are taking at home)   *If you need a refill on your cardiac medications before your next appointment, please call your pharmacy*   Lab Work: CBC, BNP, BMET today   If you have labs (blood work) drawn today and your tests are completely normal, you will receive your results only by: Marland Kitchen MyChart Message (if you have MyChart) OR . A paper copy in the mail If you have any lab test that is abnormal or we need to change your treatment, we  will call you to review the results.   Follow-Up: At [facility], you and your health needs are our priority.  As part of  our continuing mission to provide you with exceptional heart care, we have created designated Provider Care Teams.  These Care Teams include your primary Cardiologist (physician) and Advanced Practice Providers (APPs -  Physician Assistants and Nurse Practitioners) who all work together to provide you with the care you need, when you need it.  We recommend signing up for the patient portal called "MyChart".  Sign up information is provided on this After Visit Summary.  MyChart is used to connect with patients for Virtual Visits (Telemedicine).  Patients are able to view lab/test results, encounter notes, upcoming appointments, etc.  Non-urgent messages can be sent to your provider as well.   To learn more about what you can do with MyChart, go to [email or web address].    Your next appointment:   May 31st at 10:15 AM    The format for your next appointment:   In Person  Provider:   You will see one of the following Advanced Practice Providers on your designated Care Team:    Sande Rives, PA-C    Other instructions:  >please notify of any weight gain (3 lbs in a day, 5 lbs in a week) >decrease and watch your sodium intake  >please decrease fluid (no more than 2L or 64 oz in a day)     Signed, Darreld Mclean, PA-C  06/09/2020 10:15 PM    [place]

## 2020-06-09 NOTE — Telephone Encounter (Signed)
See telephone note from today.

## 2020-06-09 NOTE — Patient Instructions (Addendum)
Medication Instructions:  No changes for now (please mychart [place] what medications you are taking at home)   *If you need a refill on your cardiac medications before your next appointment, please call your pharmacy*   Lab Work: CBC, BNP, BMET today   If you have labs (blood work) drawn today and your tests are completely normal, you will receive your results only by: Marland Kitchen MyChart Message (if you have MyChart) OR . A paper copy in the mail If you have any lab test that is abnormal or we need to change your treatment, we will call you to review the results.   Follow-Up: At [facility], you and your health needs are our priority.  As part of our continuing mission to provide you with exceptional heart care, we have created designated Provider Care Teams.  These Care Teams include your primary Cardiologist (physician) and Advanced Practice Providers (APPs -  Physician Assistants and Nurse Practitioners) who all work together to provide you with the care you need, when you need it.  We recommend signing up for the patient portal called "MyChart".  Sign up information is provided on this After Visit Summary.  MyChart is used to connect with patients for Virtual Visits (Telemedicine).  Patients are able to view lab/test results, encounter notes, upcoming appointments, etc.  Non-urgent messages can be sent to your provider as well.   To learn more about what you can do with MyChart, go to [email or web address].    Your next appointment:   May 31st at 10:15 AM    The format for your next appointment:   In Person  Provider:   You will see one of the following Advanced Practice Providers on your designated Care Team:    Sande Rives, PA-C    Other instructions:  >please notify of any weight gain (3 lbs in a day, 5 lbs in a week) >decrease and watch your sodium intake  >please decrease fluid (no more than 2L or 64 oz in a day)

## 2020-06-09 NOTE — Telephone Encounter (Signed)
Pt c/o swelling: STAT is pt has developed SOB within 24 hours  1) How much weight have you gained and in what time span?  About 10-15 lbs in the past few weeks   If swelling, where is the swelling located? * Feet and legs   2) Are you currently taking a fluid pill?  Yes, furosemide (LASIX) 20 MG tablet  3) Are you currently SOB?  Yes   4) Do you have a log of your daily weights (if so, list)?  No log available   5) Have you gained 3 pounds in a day or 5 pounds in a week?  Patient states he gained 10 lbs in the past few weeks  6) Have you traveled recently?  No   Pt c/o Shortness Of Breath: STAT if SOB developed within the last 24 hours or pt is noticeably SOB on the phone  1. Are you currently SOB (can you hear that pt is SOB on the phone)?  Yes   2. How long have you been experiencing SOB?  Past few weeks   3. Are you SOB when sitting or when up moving around?  When up and moving around mainly   4. Are you currently experiencing any other symptoms?  Dizziness, headaches  STAT if patient feels like he/she is going to faint   1) Are you dizzy now?  Not currently  2) Do you feel faint or have you passed out? No   3) Do you have any other symptoms?  No   4) Have you checked your HR and BP (record if available)?  141/62  71

## 2020-06-10 DIAGNOSIS — I5042 Chronic combined systolic (congestive) and diastolic (congestive) heart failure: Secondary | ICD-10-CM

## 2020-06-10 LAB — BASIC METABOLIC PANEL
BUN/Creatinine Ratio: 14 (ref 10–24)
BUN: 11 mg/dL (ref 8–27)
CO2: 25 mmol/L (ref 20–29)
Calcium: 9.3 mg/dL (ref 8.6–10.2)
Chloride: 106 mmol/L (ref 96–106)
Creatinine, Ser: 0.8 mg/dL (ref 0.76–1.27)
Glucose: 114 mg/dL — ABNORMAL HIGH (ref 65–99)
Potassium: 4.2 mmol/L (ref 3.5–5.2)
Sodium: 143 mmol/L (ref 134–144)
eGFR: 95 mL/min/{1.73_m2} (ref >59)

## 2020-06-10 LAB — CBC
Hematocrit: 36.4 % — ABNORMAL LOW (ref 37.5–51.0)
Hemoglobin: 12.5 g/dL — ABNORMAL LOW (ref 13.0–17.7)
MCH: 31.3 pg (ref 26.6–33.0)
MCHC: 34.3 g/dL (ref 31.5–35.7)
MCV: 91 fL (ref 79–97)
Platelets: 92 10*3/uL — CL (ref 150–450)
RBC: 4 x10E6/uL — ABNORMAL LOW (ref 4.14–5.80)
RDW: 13.9 % (ref 11.6–15.4)
WBC: 4.6 10*3/uL (ref 3.4–10.8)

## 2020-06-10 LAB — BRAIN NATRIURETIC PEPTIDE: BNP: 253.2 pg/mL — ABNORMAL HIGH (ref 0.0–100.0)

## 2020-06-10 MED ORDER — FUROSEMIDE 20 MG PO TABS: ORAL_TABLET | ORAL | 2 refills | Status: DC

## 2020-06-21 LAB — BASIC METABOLIC PANEL
BUN/Creatinine Ratio: 16 (ref 10–24)
BUN: 15 mg/dL (ref 8–27)
CO2: 23 mmol/L (ref 20–29)
Calcium: 9.3 mg/dL (ref 8.6–10.2)
Chloride: 103 mmol/L (ref 96–106)
Creatinine, Ser: 0.94 mg/dL (ref 0.76–1.27)
Glucose: 118 mg/dL — ABNORMAL HIGH (ref 65–99)
Potassium: 4.1 mmol/L (ref 3.5–5.2)
Sodium: 141 mmol/L (ref 134–144)
eGFR: 87 mL/min/{1.73_m2} (ref >59)

## 2020-06-25 NOTE — Progress Notes (Signed)
Cardiology Office Note:    Date:  07/01/2020   ID:  ATA PECHA, DOB [birth year], MRN [id number]  PCP:  Isaac Bliss, Rayford Halsted, MD  Cardiologist:  Kirk Ruths, MD  Electrophysiologist:  None   Referring MD: Isaac Bliss, Estel*   Chief Complaint: follow-up of worsening lower extremity edema and dyspnea on exertion   History of Present Illness:    [name] is a 70 y.o. male with a history of CAD s/p CABG x4 (LIMA to LAD, SVG to Diag, SVG to OM, SVG to PDA) in 07/2004, moderate to severe aortic stenosis on most recent Echo on 06/06/2020, atrial fibrillation/flutter s/p DCCV in 12/2017 and again in 09/2019 on Eliquis, chronic diastolic CHF with EF of 64-40% on most recent Echo on 06/06/2020, chronic lower extremity edema,  hypertension, hyperlipidemia, obstructive sleep apnea, and obesity who is followed by Dr. Stanford Breed and presents today for follow-up of worsening lower extremity edema and dyspnea on exertion.  Patient has a significant cardiac history. He has known CAD with remote CABG x4 in 07/2004. Most recent ischemic evaluation was a Myoview in 09/2013 which was low risk with no evidence of ischemia but possible mild decrease in systolic BP at peak exercise. Patient was noted to have new onset atrial fibrillation in 09/2017 and was then later found to have atrial flutter as well. Ultimately underwent DCCV for atypical atrial flutter in 12/2017. Echo in 06/2019 showed LVEF of 55-60% with normal wall motion, grade 3 diastolic dysfunction, and mild to moderate AS. He has chronic lower extremity edema managed with Lasix. He was noted to have recurrent atrial fibrillation/flutter at office visit in 09/2019 and underwent DCCV later that month. There were also discussion about having patient evaluated in the [facility] for consideration of Tikosyn but this never happened. However, monitor in 01/2020 showed underlying sinus rhythm with rate PACs/PVCs. He was last seen by Dr.  Stanford Breed for a virtual visit in 02/2020 at which time he noted unchanged chronic lower extremity edema and stable dyspnea. Patient had repeat Echo on 06/06/2020 for routine monitoring of AS which showed LVEF of 60-65% with severe hypokinesis of the mid-apical anterior wall and apical segment, severely dilated left atrial size, and moderate to severe aortic stenosis with mean gradient of 32.0 mmHg and VTI of 0.85 cm^2.  Patient was seen by me on 06/09/2020 at which time she reported worsening lower extremities extending up to his knees and worsening dyspnea. BNP was mildly elevated at 253. Lasix was increased to 60mg  in the morning and 40mg  in the evening.   Patient presents today for follow-up. Patient doing well since last visit. He still has lower extremity edema and shortness of breath with activity but he states both of these have improved with increase dose of Lasix and are back to his baseline. Orthopnea has resolved. No PND. Weight down 8lbs since last office visit. No chest pain. He notes occasional palpitations that he describes as rare "heart racing" but no lightheadedness/dizziness or near syncope/synope. Overall, feeling much better than last visit.  Past Medical History:  Diagnosis Date  . Adjustment disorder with depressed mood 09/07/2007  . Colon polyps    Tubular Adenoma 2010  . CONGESTIVE HEART FAILURE 12/09/2008  . CORONARY ARTERY DISEASE 2006  . HYPERLIPIDEMIA 08/09/2006  . HYPERTENSION 08/09/2006  . MYOCARDIAL INFARCTION, HX OF 07/07/2004  . 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 OBESITY 07/19/2008  . Sleep apnea     Past Surgical History:  Procedure Laterality  Date  . CARDIOVERSION N/A 12/14/2017   Procedure: CARDIOVERSION;  Surgeon: [name], MD;  Location[facility] ENDOSCOPY;  Service: Cardiovascular;  Laterality: N/A;  . CARDIOVERSION N/A 10/01/2019   Procedure: CARDIOVERSION;  Surgeon: Buford Dresser, MD;  Location: [facility] ENDOSCOPY;  Service: Cardiovascular;  Laterality:  N/A;  . COLONOSCOPY    . CORONARY ARTERY BYPASS GRAFT  2006    Current Medications: Current Meds  Medication Sig  . acetaminophen (TYLENOL) 500 MG tablet Take 1,000 mg by mouth daily as needed for moderate pain or headache.  Marland Kitchen atorvastatin (LIPITOR) 80 MG tablet TAKE 1 TABLET BY MOUTH EVERY DAY  . ELIQUIS 5 MG TABS tablet TAKE 1 TABLET BY MOUTH TWICE A DAY  . furosemide (LASIX) 20 MG tablet Take 3 tablets (60mg ) in the morning and 2 tablets (40mg ) in the evening.  . hydrocortisone 2.5 % cream Apply topically.  Marland Kitchen KLOR-CON M20 20 MEQ tablet TAKE 1 TABLET BY MOUTH EVERY DAY  . metoprolol succinate (TOPROL-XL) 25 MG 24 hr tablet TAKE 1 TABLET BY MOUTH EVERY DAY WITH OR IMMEDIATELY FOLLOWING A MEAL  . tamsulosin (FLOMAX) 0.4 MG CAPS capsule TAKE ONE CAPSULE BY MOUTH EVERY DAY AFTER SUPPER     Allergies:   Patient has no known allergies.   Social History   Socioeconomic History  . Marital status: Single    Spouse name: Not on file  . Number of children: Not on file  . Years of education: Not on file  . Highest education level: Not on file  Occupational History  . Not on file  Tobacco Use  . Smoking status: Never Smoker  . Smokeless tobacco: Never Used  Substance and Sexual Activity  . Alcohol use: Yes    Alcohol/week: 1.0 standard drink    Types: 1 Cans of beer per week    Comment: occasional  . Drug use: No  . Sexual activity: Not on file  Other Topics Concern  . Not on file  Social History Narrative  . Not on file   Social Determinants of Health   Financial Resource Strain: Not on file  Food Insecurity: Not on file  Transportation Needs: Not on file  Physical Activity: Not on file  Stress: Not on file  Social Connections: Not on file     Family History: The patient's family history includes Bipolar disorder in his brother; Heart disease in his father; Hyperlipidemia in his mother; Hypertension in his mother; Ulcers in his father. There is no history of Colon  cancer.  ROS:   Please see the history of present illness.     EKGs/Labs/Other Studies Reviewed:    The following studies were reviewed today:  7 Day Zio Monitor 12/2019: Sinus bradycardia, normal sinus rhythm, rare PAC and PVC. _______________  Echocardiogram 06/06/2020: Impressions: 1. Left ventricular ejection fraction, by estimation, is 60 to 65%. The  left ventricle has normal function. The left ventricle demonstrates  regional wall motion abnormalities (see scoring diagram/findings for  description). Left ventricular diastolic  parameters are consistent with Grade III diastolic dysfunction  (restrictive). Elevated left ventricular end-diastolic pressure. The E/e'  is 49. There is severe hypokinesis of the left ventricular, mid-apical  anterior wall and apical segment. The average  left ventricular global longitudinal strain is -19.1 %. The global  longitudinal strain is abnormal.  2. Right ventricular systolic function is normal. The right ventricular  size is normal. There is normal pulmonary artery systolic pressure. The  estimated right ventricular systolic pressure is 45.8 mmHg.  3. Left atrial size was severely dilated.  4. The mitral valve is abnormal. Trivial mitral valve regurgitation.  5. The aortic valve is calcified. There is severe calcifcation of the  aortic valve. There is moderate thickening of the aortic valve. Aortic  valve regurgitation is not visualized. Moderate to severe aortic valve  stenosis. Aortic valve area, by VTI  measures 0.85 cm. Aortic valve mean gradient measures 32.0 mmHg. Aortic  valve Vmax measures 3.71 m/s. DI is 0.27.  6. The inferior vena cava is normal in size with <50% respiratory  variability, suggesting right atrial pressure of 8 mmHg.   Comparison(s): Changes from prior study are noted. 07/02/2019: LVEF 55-60%, grade 3 DD mild to moderate AS - mean gradient 18 mmHg.   EKG:  EKG not ordered today.  Recent Labs: 01/07/2020:  ALT 31 06/09/2020: BNP 253.2; Hemoglobin 12.5; Platelets 92 06/20/2020: BUN 15; Creatinine, Ser 0.94; Potassium 4.1; Sodium 141  Recent Lipid Panel    Component Value Date/Time   CHOL 111 11/30/2019 0929   CHOL 111 07/23/2019 1144   TRIG 68 11/30/2019 0929   HDL 44 11/30/2019 0929   HDL 48 07/23/2019 1144   CHOLHDL 2.5 11/30/2019 0929   VLDL 24.2 07/04/2017 1535   LDLCALC 53 11/30/2019 0929   LDLDIRECT 198.2 07/11/2009 0925    Physical Exam:    Vital Signs: BP 130/62   Pulse 69   Ht 5\' 6"  (1.676 m)   Wt 231 lb 12.8 oz (105.1 kg)   SpO2 90%   BMI 37.41 kg/m     Wt Readings from Last 3 Encounters:  07/01/20 231 lb 12.8 oz (105.1 kg)  06/09/20 239 lb 12.8 oz (108.8 kg)  02/18/20 235 lb (106.6 kg)     General: 70 y.o. male in no acute distress. HEENT: Normocephalic and atraumatic. Sclera clear.  Neck: Supple. No JVD. Heart: RRR. Distinct S1 and S2. III/VI systolic murmur best heard at upper sternal border. No rubs or gallops. Radial pulses 2+ and equal bilaterally. Lungs: No increased work of breathing. Clear to ausculation bilaterally. No wheezes, rhonchi, or rales.  Abdomen: Soft, non-distended, and non-tender to palpation.  Extremities: 1-2+ pitting edema of bilateral lower extremity edema (worse around ankles) Right leg slightly larger than left chronically. Skin: Warm and dry. Neuro: Alert and oriented x3. No focal deficits. Psych: Normal affect. Responds appropriately.  Assessment:    1. Chronic diastolic CHF (congestive heart failure) ([place])   2. Coronary artery disease involving native coronary artery of native heart without angina pectoris   3. Paroxysmal atrial fibrillation/flutter   4. Aortic valve stenosis, etiology of cardiac valve disease unspecified   5. Primary hypertension   6. Hyperlipidemia, unspecified hyperlipidemia type     Plan:    Chronic Diastolic CHF  - Patient was seen earlier this month and reported worsening chronic lower extremity edema  and dyspnea on exertion. BNP was mildly elevated at 253. Therefore, Lasix was increased. Here today for follow-up. Symptoms improved with increase in Lasix and patient feels back to baseline. - Echo on 06/06/2020 showed LVEF of 60-65% with severe hypokinesis of the mid-apical anterior wall and apical segment, severely dilated left atrial size, and moderate to severe aortic stenosis. - Chronic lower extremity on exam but otherwise appears euvolemic. Weight down 8lbs from last visit. - Continue Lasix 60mg  in the morning and 40mg  in the evening. - Continue daily weights and sodium/fluid restrictions.   CAD - S/p remote CABG x4 in 2006.  - No chest  pain.  - No aspirin due to need for DOAC.  - Continue beta-blocker and high-intensity statin.   Paroxysmal Atrial Fibrillation/Flutter - Maintaining sinus rhythm. - Continue Toprol-XL 25mg  daily.  - Continue chronic anticoagulation with Eliquis 5mg  twice daily.   Aortic Stenosis  - Recent Echo on 06/06/2020 showed moderate to severe aortic stenosis with mean gradient of 32.0 mmHg and VTI of 0.85 cm^2. Progressed from mild to moderate from Echo in 06/2019. Reviewed results of most recent Echo with patient.  - Discussed with Dr. Stanford Breed at last visit who will plan to see patient in about 8-12 weeks and discuss further evaluation of this at that time. Has appointment scheduled with Dr. Stanford Breed for 09/26/2020.  Hypertension - BP relatively well controlled.  - Continue Toprol-XL 25mg  daily.   Hyperlipidemia - Continue Lipitor 80mg  daily.    Disposition: Keep follow-up visit with Dr. Stanford Breed on 09/26/2020.   Medication Adjustments/Labs and Tests Ordered: Current medicines are reviewed at length with the patient today.  Concerns regarding medicines are outlined above.  No orders of the defined types were placed in this encounter.  No orders of the defined types were placed in this encounter.   Patient Instructions  Heart Failure  Education: 1. Weigh yourself EVERY morning after you go to the bathroom but before you eat or drink anything. Write this number down in a weight log/diary. If you gain 3 pounds overnight or 5 pounds in a week, call the office. 2. Take your medicines as prescribed. If you have concerns about your medications, please call us before you stop taking them.  3. Eat low salt foods--Limit salt (sodium) to 2000 mg per day. This will help prevent your body from holding onto fluid. Read food labels as many processed foods have a lot of sodium, especially canned goods and prepackaged meats. If you would like some assistance choosing low sodium foods, we would be happy to set you up with a nutritionist. 4. Limit all fluids for the day to less than 2 liters (64 ounces). Fluid includes all drinks, coffee, juice, ice chips, soup, jello, and all other liquids. 5. Stay as active as you can everyday. Staying active will give you more energy and make your muscles stronger. Start with 5 minutes at a time and work your way up to 30 minutes a day. Break up your activities--do some in the morning and some in the afternoon. Start with 3 days per week and work your way up to 5 days as you can.  If you have chest pain, feel short of breath, dizzy, or lightheaded, STOP. If you don't feel better after a short rest, call 911. If you do feel better, call the office to let us know you have symptoms with exercise.      Signed, Darreld Mclean, PA-C  07/01/2020 10:45 AM    [place]

## 2020-07-01 ENCOUNTER — Other Ambulatory Visit: Payer: Self-pay

## 2020-07-01 ENCOUNTER — Encounter: Payer: Self-pay | Admitting: Student

## 2020-07-01 ENCOUNTER — Ambulatory Visit: Payer: 59 | Admitting: Student

## 2020-07-01 VITALS — BP 130/62 | HR 69 | Ht 66.0 in | Wt 231.8 lb

## 2020-07-01 DIAGNOSIS — I350 Nonrheumatic aortic (valve) stenosis: Secondary | ICD-10-CM | POA: Diagnosis not present

## 2020-07-01 DIAGNOSIS — I2510 Atherosclerotic heart disease of native coronary artery without angina pectoris: Secondary | ICD-10-CM | POA: Diagnosis not present

## 2020-07-01 DIAGNOSIS — I10 Essential (primary) hypertension: Secondary | ICD-10-CM

## 2020-07-01 DIAGNOSIS — I480 Paroxysmal atrial fibrillation: Secondary | ICD-10-CM

## 2020-07-01 DIAGNOSIS — I5032 Chronic diastolic (congestive) heart failure: Secondary | ICD-10-CM

## 2020-07-01 DIAGNOSIS — E785 Hyperlipidemia, unspecified: Secondary | ICD-10-CM

## 2020-07-01 NOTE — Patient Instructions (Addendum)
Medication Instructions:  Your physician recommends that you continue on your current medications as directed. Please refer to the Current Medication list given to you today.  *If you need a refill on your cardiac medications before your next appointment, please call your pharmacy*   Lab Work: None ordered.   Testing/Procedures: None ordered.    Follow-Up: At [facility], you and your health needs are our priority.  As part of our continuing mission to provide you with exceptional heart care, we have created designated Provider Care Teams.  These Care Teams include your primary Cardiologist (physician) and Advanced Practice Providers (APPs -  Physician Assistants and Nurse Practitioners) who all work together to provide you with the care you need, when you need it.  We recommend signing up for the patient portal called "MyChart".  Sign up information is provided on this After Visit Summary.  MyChart is used to connect with patients for Virtual Visits (Telemedicine).  Patients are able to view lab/test results, encounter notes, upcoming appointments, etc.  Non-urgent messages can be sent to your provider as well.   To learn more about what you can do with MyChart, go to [email or web address].    Your next appointment:   Please keep your next appointment with Dr. Stanford Breed for follow up (8/26).    Other Instructions   Heart Failure Education: 1. Weigh yourself EVERY morning after you go to the bathroom but before you eat or drink anything. Write this number down in a weight log/diary. If you gain 3 pounds overnight or 5 pounds in a week, call the office. 2. Take your medicines as prescribed. If you have concerns about your medications, please call us before you stop taking them.  3. Eat low salt foods--Limit salt (sodium) to 2000 mg per day. This will help prevent your body from holding onto fluid. Read food labels as many processed foods have a lot of sodium, especially canned goods  and prepackaged meats. If you would like some assistance choosing low sodium foods, we would be happy to set you up with a nutritionist. 4. Limit all fluids for the day to less than 2 liters (64 ounces). Fluid includes all drinks, coffee, juice, ice chips, soup, jello, and all other liquids. 5. Stay as active as you can everyday. Staying active will give you more energy and make your muscles stronger. Start with 5 minutes at a time and work your way up to 30 minutes a day. Break up your activities--do some in the morning and some in the afternoon. Start with 3 days per week and work your way up to 5 days as you can.  If you have chest pain, feel short of breath, dizzy, or lightheaded, STOP. If you don't feel better after a short rest, call 911. If you do feel better, call the office to let us know you have symptoms with exercise.

## 2020-07-10 ENCOUNTER — Other Ambulatory Visit: Payer: Self-pay | Admitting: Internal Medicine

## 2020-07-14 ENCOUNTER — Other Ambulatory Visit: Payer: Self-pay | Admitting: Hematology and Oncology

## 2020-07-14 ENCOUNTER — Inpatient Hospital Stay: Payer: 59 | Admitting: Hematology and Oncology

## 2020-07-14 ENCOUNTER — Inpatient Hospital Stay: Payer: 59 | Attending: Hematology and Oncology

## 2020-07-14 ENCOUNTER — Encounter: Payer: Self-pay | Admitting: Hematology and Oncology

## 2020-07-14 ENCOUNTER — Other Ambulatory Visit: Payer: Self-pay

## 2020-07-14 VITALS — BP 137/55 | HR 63 | Temp 98.3°F | Resp 17 | Wt 232.2 lb

## 2020-07-14 DIAGNOSIS — E669 Obesity, unspecified: Secondary | ICD-10-CM | POA: Insufficient documentation

## 2020-07-14 DIAGNOSIS — Z7901 Long term (current) use of anticoagulants: Secondary | ICD-10-CM | POA: Insufficient documentation

## 2020-07-14 DIAGNOSIS — E538 Deficiency of other specified B group vitamins: Secondary | ICD-10-CM | POA: Insufficient documentation

## 2020-07-14 DIAGNOSIS — I509 Heart failure, unspecified: Secondary | ICD-10-CM | POA: Insufficient documentation

## 2020-07-14 DIAGNOSIS — D693 Immune thrombocytopenic purpura: Secondary | ICD-10-CM | POA: Insufficient documentation

## 2020-07-14 DIAGNOSIS — Z79899 Other long term (current) drug therapy: Secondary | ICD-10-CM | POA: Diagnosis not present

## 2020-07-14 DIAGNOSIS — I110 Hypertensive heart disease with heart failure: Secondary | ICD-10-CM | POA: Diagnosis not present

## 2020-07-14 DIAGNOSIS — E785 Hyperlipidemia, unspecified: Secondary | ICD-10-CM | POA: Insufficient documentation

## 2020-07-14 DIAGNOSIS — D696 Thrombocytopenia, unspecified: Secondary | ICD-10-CM

## 2020-07-14 DIAGNOSIS — G4730 Sleep apnea, unspecified: Secondary | ICD-10-CM | POA: Diagnosis not present

## 2020-07-14 DIAGNOSIS — I252 Old myocardial infarction: Secondary | ICD-10-CM | POA: Diagnosis not present

## 2020-07-14 LAB — CBC WITH DIFFERENTIAL (CANCER CENTER ONLY)
Abs Immature Granulocytes: 0.01 10*3/uL (ref 0.00–0.07)
Basophils Absolute: 0 10*3/uL (ref 0.0–0.1)
Basophils Relative: 1 %
Eosinophils Absolute: 0.2 10*3/uL (ref 0.0–0.5)
Eosinophils Relative: 4 %
HCT: 36.7 % — ABNORMAL LOW (ref 39.0–52.0)
Hemoglobin: 12.2 g/dL — ABNORMAL LOW (ref 13.0–17.0)
Immature Granulocytes: 0 %
Lymphocytes Relative: 32 %
Lymphs Abs: 1.5 10*3/uL (ref 0.7–4.0)
MCH: 31.4 pg (ref 26.0–34.0)
MCHC: 33.2 g/dL (ref 30.0–36.0)
MCV: 94.6 fL (ref 80.0–100.0)
Monocytes Absolute: 0.3 10*3/uL (ref 0.1–1.0)
Monocytes Relative: 6 %
Neutro Abs: 2.7 10*3/uL (ref 1.7–7.7)
Neutrophils Relative %: 57 %
Platelet Count: 70 10*3/uL — ABNORMAL LOW (ref 150–400)
RBC: 3.88 MIL/uL — ABNORMAL LOW (ref 4.22–5.81)
RDW: 15.3 % (ref 11.5–15.5)
WBC Count: 4.7 10*3/uL (ref 4.0–10.5)
nRBC: 0 % (ref 0.0–0.2)

## 2020-07-14 LAB — CMP (CANCER CENTER ONLY)
ALT: 24 U/L (ref 0–44)
AST: 48 U/L — ABNORMAL HIGH (ref 15–41)
Albumin: 3.4 g/dL — ABNORMAL LOW (ref 3.5–5.0)
Alkaline Phosphatase: 79 U/L (ref 38–126)
Anion gap: 9 (ref 5–15)
BUN: 13 mg/dL (ref 8–23)
CO2: 26 mmol/L (ref 22–32)
Calcium: 9.2 mg/dL (ref 8.9–10.3)
Chloride: 105 mmol/L (ref 98–111)
Creatinine: 0.83 mg/dL (ref 0.61–1.24)
GFR, Estimated: >60 mL/min (ref >60)
Glucose, Bld: 114 mg/dL — ABNORMAL HIGH (ref 70–99)
Potassium: 3.9 mmol/L (ref 3.5–5.1)
Sodium: 140 mmol/L (ref 135–145)
Total Bilirubin: 2.7 mg/dL — ABNORMAL HIGH (ref 0.3–1.2)
Total Protein: 7.5 g/dL (ref 6.5–8.1)

## 2020-07-14 LAB — VITAMIN B12: Vitamin B-12: 377 pg/mL (ref 180–914)

## 2020-07-14 MED ORDER — VITAMIN B-12 1000 MCG PO TABS: 1000.0000 ug | ORAL_TABLET | Freq: Every day | ORAL | 0 refills | Status: DC

## 2020-07-14 NOTE — Progress Notes (Signed)
[place] [phone]   [phone]  PROGRESS NOTE  Patient Care Team: Isaac Bliss, Rayford Halsted, MD as PCP - General (Internal Medicine) Lelon Perla, MD as PCP - Cardiology (Cardiology)  Hematological/Oncological History # Thrombocytopenia, chronic 1) 07/11/2009: WBC 8.9, Hgb 15.1, MCV 93.7, Plt 188 2) 09/22/2010: WBC 6.3, Hgb 14.4, MCV 95.1, Plt 135 3) 08/01/2015: WBC 5.6, Hgb 12.7, MCV 90.8, Plt 86 4) 09/05/2017: WBC 4.4, Hgb 11.9, MCV 92, Plt 91 5) 11/24/2018: WBC 3.9, Hgb 12.1, MCV 91, Plt 82 6) 11/30/2019: WbC 5.0, Hgb 13.5, MCV 95.1, Plt 91 7) 01/07/2020: establish care with Dr. Lorenso Courier  8) 07/14/2020: WBC 4.7, Hgb 12.2, Plt 70, MCV 94.6  Interval History:  [name] 70 y.o. male with medical history significant for thrombocytopenia of unclear etiolgoy who presents for a follow up visit. The patient's last visit was on 01/07/2020. In the interim since the last visit he has had no major changes in his health.  On exam today [name] reports that he has been well in the interim since her last visit.  He reports that he is continue to follow with his cardiologist and found that his aortic valve is worsening.  He is also been increasing on his furosemide for his swelling in his legs and ankles.  He reports that he was able to play golf last week and his team placed a second placement 21 of 13 teams.  He has been taking his Eliquis and had no trouble with major bleeding, but does note some occasional bleeding of his gums as well as blood when brushing his teeth.  He has some bruising on his arms which is mild.  He denies any dark stools or bright red blood in the stool.  He reports that he continues to work but is anticipating retirement at the end of this year.  He otherwise denies any fevers, chills, sweats, nausea, vomiting or diarrhea.  A full 10 point ROS is listed below.  MEDICAL HISTORY:  Past Medical History:  Diagnosis Date    Adjustment disorder with depressed mood 09/07/2007   Colon polyps    Tubular Adenoma 2010   CONGESTIVE HEART FAILURE 12/09/2008   CORONARY ARTERY DISEASE 2006   HYPERLIPIDEMIA 08/09/2006   HYPERTENSION 08/09/2006   MYOCARDIAL INFARCTION, HX OF 07/07/2004   NEPHROLITHIASIS, HX OF 08/09/2006   OBESITY 07/19/2008   Sleep apnea     SURGICAL HISTORY: Past Surgical History:  Procedure Laterality Date   CARDIOVERSION N/A 12/14/2017   Procedure: CARDIOVERSION;  Surgeon: [name], MD;  Location: [facility];  Service: Cardiovascular;  Laterality: N/A;   CARDIOVERSION N/A 10/01/2019   Procedure: CARDIOVERSION;  Surgeon: Buford Dresser, MD;  Location: [facility] ENDOSCOPY;  Service: Cardiovascular;  Laterality: N/A;   COLONOSCOPY     CORONARY ARTERY BYPASS GRAFT  2006    SOCIAL HISTORY: Social History   Socioeconomic History   Marital status: Single    Spouse name: Not on file   Number of children: Not on file   Years of education: Not on file   Highest education level: Not on file  Occupational History   Not on file  Tobacco Use   Smoking status: Never   Smokeless tobacco: Never  Substance and Sexual Activity   Alcohol use: Yes    Alcohol/week: 1.0 standard drink    Types: 1 Cans of beer per week    Comment: occasional   Drug use: No   Sexual activity: Not on file  Other Topics Concern   Not on file  Social History Narrative   Not on file   Social Determinants of Health   Financial Resource Strain: Not on file  Food Insecurity: Not on file  Transportation Needs: Not on file  Physical Activity: Not on file  Stress: Not on file  Social Connections: Not on file  Intimate Partner Violence: Not on file    FAMILY HISTORY: Family History  Problem Relation Age of Onset   Hypertension Mother    Hyperlipidemia Mother    Heart disease Father    Ulcers Father    Bipolar disorder Brother    Colon cancer Neg Hx     ALLERGIES:  has No Known Allergies.  MEDICATIONS:   Current Outpatient Medications  Medication Sig Dispense Refill   vitamin B-12 (CYANOCOBALAMIN) 1000 MCG tablet Take 1 tablet (1,000 mcg total) by mouth daily. 30 tablet 0   acetaminophen (TYLENOL) 500 MG tablet Take 1,000 mg by mouth daily as needed for moderate pain or headache.     atorvastatin (LIPITOR) 80 MG tablet TAKE 1 TABLET BY MOUTH EVERY DAY 90 tablet 1   ELIQUIS 5 MG TABS tablet TAKE 1 TABLET BY MOUTH TWICE A DAY 60 tablet 5   furosemide (LASIX) 20 MG tablet Take 3 tablets (46m) in the morning and 2 tablets (461m in the evening. 80 tablet 2   hydrocortisone 2.5 % cream Apply topically.     KLOR-CON M20 20 MEQ tablet TAKE 1 TABLET BY MOUTH EVERY DAY 90 tablet 3   metoprolol succinate (TOPROL-XL) 25 MG 24 hr tablet TAKE 1 TABLET BY MOUTH EVERY DAY WITH OR IMMEDIATELY FOLLOWING A MEAL 30 tablet 11   tamsulosin (FLOMAX) 0.4 MG CAPS capsule TAKE ONE CAPSULE BY MOUTH EVERY DAY AFTER SUPPER 90 capsule 1   No current facility-administered medications for this visit.    REVIEW OF SYSTEMS:   Constitutional: ( - ) fevers, ( - )  chills , ( - ) night sweats Eyes: ( - ) blurriness of vision, ( - ) double vision, ( - ) watery eyes Ears, nose, mouth, throat, and face: ( - ) mucositis, ( - ) sore throat Respiratory: ( - ) cough, ( - ) dyspnea, ( - ) wheezes Cardiovascular: ( - ) palpitation, ( - ) chest discomfort, ( - ) lower extremity swelling Gastrointestinal:  ( - ) nausea, ( - ) heartburn, ( - ) change in bowel habits Skin: ( - ) abnormal skin rashes Lymphatics: ( - ) new lymphadenopathy, ( - ) easy bruising Neurological: ( - ) numbness, ( - ) tingling, ( - ) new weaknesses Behavioral/Psych: ( - ) mood change, ( - ) new changes  All other systems were reviewed with the patient and are negative.  PHYSICAL EXAMINATION: Vitals:   07/14/20 1553  BP: (!) 137/55  Pulse: 63  Resp: 17  Temp: 98.3 F (36.8 C)  SpO2: 93%   Filed Weights   07/14/20 1553  Weight: 232 lb 3.2 oz (105.3  kg)    GENERAL: well appearing elderly Caucasian male. Alert, no distress and comfortable SKIN: skin color, texture, turgor are normal, no rashes or significant lesions EYES: conjunctiva are pink and non-injected, sclera clear LUNGS: clear to auscultation and percussion with normal breathing effort HEART: regular rate & rhythm and no murmurs and +2 lower extremity edema Musculoskeletal: no cyanosis of digits and no clubbing  PSYCH: alert & oriented x 3, fluent speech NEURO: no focal motor/sensory deficits  LABORATORY  DATA:  I have reviewed the data as listed CBC Latest Ref Rng & Units 07/14/2020 06/09/2020 01/07/2020  WBC 4.0 - 10.5 K/uL 4.7 4.6 4.2  Hemoglobin 13.0 - 17.0 g/dL 12.2(L) 12.5(L) 11.4(L)  Hematocrit 39.0 - 52.0 % 36.7(L) 36.4(L) 34.5(L)  Platelets 150 - 400 K/uL 70(L) 92(LL) 77(L)    CMP Latest Ref Rng & Units 07/14/2020 06/20/2020 06/09/2020  Glucose 70 - 99 mg/dL 114(H) 118(H) 114(H)  BUN 8 - 23 mg/dL '13 15 11  ' Creatinine 0.61 - 1.24 mg/dL 0.83 0.94 0.80  Sodium 135 - 145 mmol/L 140 141 143  Potassium 3.5 - 5.1 mmol/L 3.9 4.1 4.2  Chloride 98 - 111 mmol/L 105 103 106  CO2 22 - 32 mmol/L '26 23 25  ' Calcium 8.9 - 10.3 mg/dL 9.2 9.3 9.3  Total Protein 6.5 - 8.1 g/dL 7.5 - -  Total Bilirubin 0.3 - 1.2 mg/dL 2.7(H) - -  Alkaline Phos 38 - 126 U/L 79 - -  AST 15 - 41 U/L 48(H) - -  ALT 0 - 44 U/L 24 - -    No results found for: MPROTEIN No results found for: KPAFRELGTCHN, LAMBDASER, KAPLAMBRATIO   RADIOGRAPHIC STUDIES: No results found.  ASSESSMENT & PLAN Yaviel Kloster Mirando 70 y.o. male with medical history significant for thrombocytopenia of unclear etiolgoy who presents for a follow up visit.    After review the labs, the records, schedule the patient the findings are most consistent with chronic worsening thrombocytopenia.  The patient's thrombocytopenia dates back to at least 2012.  The differential for this includes pseudothrombocytopenia, liver disease, ITP, or  bone marrow dysfunction/nutritional deficiency.  At this time I have a strong suspicion of liver disease given his mild LFT elevations and history of obesity.ITP and vitamin b12 deficiency are other possible etiologies.   #Thrombocytopenia, chronic #Mild Vitamin B12 deficieincy --differential includes pseudothrombocytopenia, liver disease, ITP, or bone marrow dysfunction/nutritional deficiency. --at this time favor liver disease given his mild LFT elevations and obesity. Possible NAFLD --negative viral studies including Hep B, C --borderline low vitamin b12 levels, will start PO supplementation today.  --today will order US liver/spleen. --if no clear etiology noted above recommend proceeding with bone marrow biopsy.   --patient followed with Dr. Havery Moros in GI. Consider referral to [facility] if liver disease is suspected. --RTC in 3 months or sooner if further workup is required.   Orders Placed This Encounter  Procedures   US Abdomen Complete    Standing Status:   Future    Standing Expiration Date:   07/14/2021    Order Specific Question:   Reason for Exam (SYMPTOM  OR DIAGNOSIS REQUIRED)    Answer:   Assess for liver disease/splenomegaly    Order Specific Question:   Preferred imaging location?    Answer:   [facility]    All questions were answered. The patient knows to call the clinic with any problems, questions or concerns.  A total of more than 30 minutes were spent on this encounter with face-to-face time and non-face-to-face time, including preparing to see the patient, ordering tests and/or medications, counseling the patient and coordination of care as outlined above.   Ledell Peoples, MD Department of Hematology/Oncology [place] at [facility] Phone: [phone] Pager: [phone] Email: [email or web address]  07/14/2020 4:28 PM

## 2020-07-15 LAB — HOMOCYSTEINE: Homocysteine: 21.8 umol/L — ABNORMAL HIGH (ref 0.0–17.2)

## 2020-07-17 ENCOUNTER — Telehealth: Payer: Self-pay | Admitting: Hematology and Oncology

## 2020-07-17 NOTE — Telephone Encounter (Signed)
Scheduled per los. Called and spoke with patient. Confirmed appt 

## 2020-07-18 LAB — METHYLMALONIC ACID, SERUM: Methylmalonic Acid, Quantitative: 197 nmol/L (ref 0–378)

## 2020-07-23 ENCOUNTER — Ambulatory Visit (HOSPITAL_COMMUNITY)
Admission: RE | Admit: 2020-07-23 | Discharge: 2020-07-23 | Disposition: A | Discharge status: Home / Self Care | Payer: 59 | Source: Ambulatory Visit | Attending: Hematology and Oncology | Admitting: Hematology and Oncology

## 2020-07-23 ENCOUNTER — Other Ambulatory Visit: Payer: Self-pay

## 2020-07-23 ENCOUNTER — Telehealth: Payer: Self-pay | Admitting: Hematology and Oncology

## 2020-07-23 ENCOUNTER — Telehealth: Payer: Self-pay | Admitting: *Deleted

## 2020-07-23 DIAGNOSIS — D696 Thrombocytopenia, unspecified: Secondary | ICD-10-CM

## 2020-07-23 NOTE — Telephone Encounter (Signed)
Resultas for pt's [place] of spleen/liver is now available.  See results in imaging

## 2020-07-23 NOTE — Telephone Encounter (Signed)
Called Mr. Burgueno to discuss the results of his ultrasound.  Findings are most consistent with a mild splenomegaly and cirrhotic features of the liver.  I have reached out to the patient's primary gastroenterologist to see if he would be able to follow him and work-up of his cirrhosis.  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  We will plan to see the patient back in 3 months in order to assure that he is established with GI and work-up is underway for his cirrhosis.  Ledell Peoples, MD Department of Hematology/Oncology [place] at [facility] Phone: [phone] Pager: [phone] Email: [email or web address]

## 2020-07-24 ENCOUNTER — Telehealth: Payer: Self-pay

## 2020-07-24 NOTE — Telephone Encounter (Signed)
Patient returned call and has been scheduled with Dr. Havery Moros on Tuesday, 09/23/20 at 3 pm.

## 2020-07-24 NOTE — Telephone Encounter (Signed)
-----   Message from Yetta Flock, MD sent at 07/23/2020  6:35 PM EDT ----- [name],  Absolutely thanks for reaching out, I can see him to work this up further and follow up.   [name] can you please schedule this patient with me for cirrhosis evaluation?  Thanks, [name]  ----- Message ----- From: Orson Slick, MD Sent: 07/23/2020  12:52 PM EDT To: Yetta Flock, MD  Dr. Havery Moros,  I am currently looking after [name] for thrombocytopenia.  Unfortunately as part of our work-up we performed an ultrasound which showed cirrhotic features of the liver.  At this time I strongly suspect nonalcoholic fatty liver disease as the cause.  Is this a patient you would be able to see for cirrhosis follow-up and work-up?  Best,  Armandina Gemma, MD Hematology/Oncology

## 2020-07-24 NOTE — Telephone Encounter (Signed)
Lm on vm for patient to return call to schedule a new patient appt with Dr. Havery Moros for cirrhosis.

## 2020-08-02 ENCOUNTER — Other Ambulatory Visit: Payer: Self-pay | Admitting: Cardiology

## 2020-08-05 ENCOUNTER — Ambulatory Visit: Payer: 59 | Admitting: Cardiovascular Disease

## 2020-08-05 NOTE — Telephone Encounter (Signed)
76m, 105.3kg, scr 0.83 07/14/20, lovw/[facility]rich 07/01/20

## 2020-08-31 ENCOUNTER — Other Ambulatory Visit: Payer: Self-pay | Admitting: Student

## 2020-08-31 DIAGNOSIS — I5042 Chronic combined systolic (congestive) and diastolic (congestive) heart failure: Secondary | ICD-10-CM

## 2020-09-05 ENCOUNTER — Ambulatory Visit: Payer: 59 | Admitting: Cardiovascular Disease

## 2020-09-16 NOTE — Progress Notes (Signed)
HPI: FU CAD, AS and atrial flutter/fibrillation; history of coronary artery disease, status post coronary artery bypass graft surgery performed in June 2006. Patient had a LIMA to the LAD, saphenous vein graft to the diagonal, saphenous vein graft to the acute marginal and saphenous vein graft to the PDA. Carotid Dopplers August 2015 showed no significant stenosis. Nuclear study August 2015 showed ejection fraction 55% but no ischemia or infarction. Possible mild decrease in systolic blood pressure at peak exercise. Abdominal ultrasound June 2017 showed no aneurysm. Patient also with h/o atrial fibrillation/atrial flutter. Monitor December 2021 showed sinus rhythm with rare PAC and PVC.  Echocardiogram May 2022 showed normal LV function, restrictive filling, hypokinesis of the mid apical anterior wall and apex, severe left atrial enlargement, moderate to severe aortic stenosis with mean gradient 32 mmHg, dimensionless index 0.27 and aortic valve area 0.85 cm.  Abdominal ultrasound June 2022 showed no aneurysm.  Since last seen he has dyspnea with more vigorous activities but not routine activities.  No orthopnea or PND.  He has chronic lower extremity edema that has improved with recent increase in diuretics.  He denies chest pain or syncope.  Current Outpatient Medications  Medication Sig Dispense Refill   acetaminophen (TYLENOL) 500 MG tablet Take 1,000 mg by mouth daily as needed for moderate pain or headache.     apixaban (ELIQUIS) 5 MG TABS tablet TAKE 1 TABLET BY MOUTH TWICE A DAY 180 tablet 1   atorvastatin (LIPITOR) 80 MG tablet TAKE 1 TABLET BY MOUTH EVERY DAY 90 tablet 1   furosemide (LASIX) 20 MG tablet TAKE 3 TABLETS ('60MG'$ ) IN THE MORNING AND 2 TABLETS ('40MG'$ ) IN THE EVENING. 80 tablet 2   hydrocortisone 2.5 % cream Apply topically.     KLOR-CON M20 20 MEQ tablet TAKE 1 TABLET BY MOUTH EVERY DAY 90 tablet 3   metoprolol succinate (TOPROL-XL) 25 MG 24 hr tablet TAKE 1 TABLET BY MOUTH  EVERY DAY WITH OR IMMEDIATELY FOLLOWING A MEAL 30 tablet 11   tamsulosin (FLOMAX) 0.4 MG CAPS capsule TAKE ONE CAPSULE BY MOUTH EVERY DAY AFTER SUPPER 90 capsule 1   vitamin B-12 (CYANOCOBALAMIN) 1000 MCG tablet Take 1 tablet (1,000 mcg total) by mouth daily. 30 tablet 0   No current facility-administered medications for this visit.     Past Medical History:  Diagnosis Date   Adjustment disorder with depressed mood 09/07/2007   Cirrhosis ([place])    Colon polyps    Tubular Adenoma 2010   CONGESTIVE HEART FAILURE 12/09/2008   CORONARY ARTERY DISEASE 2006   HYPERLIPIDEMIA 08/09/2006   HYPERTENSION 08/09/2006   MYOCARDIAL INFARCTION, HX OF 07/07/2004   NEPHROLITHIASIS, HX OF 08/09/2006   OBESITY 07/19/2008   Sleep apnea     Past Surgical History:  Procedure Laterality Date   CARDIOVERSION N/A 12/14/2017   Procedure: CARDIOVERSION;  Surgeon: [name], MD;  Location: [facility];  Service: Cardiovascular;  Laterality: N/A;   CARDIOVERSION N/A 10/01/2019   Procedure: CARDIOVERSION;  Surgeon: Buford Dresser, MD;  Location: [facility];  Service: Cardiovascular;  Laterality: N/A;   COLONOSCOPY     CORONARY ARTERY BYPASS GRAFT  2006    Social History   Socioeconomic History   Marital status: Single    Spouse name: Not on file   Number of children: 0   Years of education: Not on file   Highest education level: Not on file  Occupational History   Occupation: Passenger transport manager  Tobacco Use  Smoking status: Never   Smokeless tobacco: Never  Vaping Use   Vaping Use: Never used  Substance and Sexual Activity   Alcohol use: Yes    Alcohol/week: 1.0 standard drink    Types: 1 Cans of beer per week    Comment: occasional   Drug use: No   Sexual activity: Not on file  Other Topics Concern   Not on file  Social History Narrative   Not on file   Social Determinants of Health   Financial Resource Strain: Not on file  Food Insecurity: Not on  file  Transportation Needs: Not on file  Physical Activity: Not on file  Stress: Not on file  Social Connections: Not on file  Intimate Partner Violence: Not on file    Family History  Problem Relation Age of Onset   Hypertension Mother    Hyperlipidemia Mother    Heart disease Father    Ulcers Father    Bipolar disorder Brother    Colon cancer Neg Hx     ROS: no fevers or chills, productive cough, hemoptysis, dysphasia, odynophagia, melena, hematochezia, dysuria, hematuria, rash, seizure activity, orthopnea, PND, claudication. Remaining systems are negative.  Physical Exam: Well-developed well-nourished in no acute distress.  Skin is warm and dry.  HEENT is normal.  Neck is supple.  Chest is clear to auscultation with normal expansion.  Cardiovascular exam is regular rate and rhythm.  3/6 systolic murmur left sternal border.  S2 is not diminished. Abdominal exam nontender or distended. No masses palpated. Extremities show 2+ edema. neuro grossly intact  A/P  1 paroxysmal atrial fibrillation-patient remains in sinus rhythm.  Continue apixaban at present dose.  If he has recurrent episodes in the future can consider referral for consideration of Tikosyn.  2 aortic stenosis-moderate to severe on most recent echocardiogram.  Patient does have some dyspnea on exertion and chronic lower extremity edema likely secondary to diastolic dysfunction as this has been chronic.  His symptoms are unchanged.  I will plan to repeat his echocardiogram in November which will be 6 months from his most recent.  I will see him back afterwards.  He understands that he will likely require TAVR in the future.  He also understands the symptoms to be aware of including dyspnea, chest pain and syncope.  3 coronary artery disease-patient denies chest pain.  Continue statin.  He is not on aspirin given need for apixaban.  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  Continue Lasix at present dose.  Check potassium and renal function.  Check BMP.  5 hyperlipidemia-continue statin.  6 hypertension-patient's blood pressure is controlled.  Continue present medications.  7 recent diagnosis of cirrhosis-etiology unclear and being evaluated by gastroenterology.  May have questioned if he may have anesthesia for endoscopy and I think this is reasonable.  If necessary he can hold his apixaban 2 days prior to procedure and resume the day after.  Note unclear to me if there is a cardiac contribution to his cirrhosis.  I have personally reviewed his most recent echocardiogram and pulmonary pressures were not elevated though he did have restrictive filling.  We will follow closely.  Kirk Ruths, MD

## 2020-09-23 ENCOUNTER — Ambulatory Visit: Payer: 59 | Admitting: Gastroenterology

## 2020-09-24 ENCOUNTER — Ambulatory Visit: Payer: 59 | Admitting: Gastroenterology

## 2020-09-24 ENCOUNTER — Encounter: Payer: Self-pay | Admitting: Gastroenterology

## 2020-09-24 ENCOUNTER — Other Ambulatory Visit (INDEPENDENT_AMBULATORY_CARE_PROVIDER_SITE_OTHER): Payer: 59

## 2020-09-24 VITALS — BP 132/54 | HR 64 | Ht 63.25 in | Wt 227.1 lb

## 2020-09-24 DIAGNOSIS — Z86010 Personal history of colonic polyps: Secondary | ICD-10-CM

## 2020-09-24 DIAGNOSIS — K7460 Unspecified cirrhosis of liver: Secondary | ICD-10-CM | POA: Diagnosis not present

## 2020-09-24 LAB — PROTIME-INR
INR: 1.7 ratio — ABNORMAL HIGH (ref 0.8–1.0)
Prothrombin Time: 18.4 s — ABNORMAL HIGH (ref 9.6–13.1)

## 2020-09-24 LAB — IBC + FERRITIN
Ferritin: 37.4 ng/mL (ref 22.0–322.0)
Iron: 78 ug/dL (ref 42–165)
Saturation Ratios: 19.3 % — ABNORMAL LOW (ref 20.0–50.0)
TIBC: 403.2 ug/dL (ref 250.0–450.0)
Transferrin: 288 mg/dL (ref 212.0–360.0)

## 2020-09-24 NOTE — Patient Instructions (Addendum)
If you are age 71 or older, your body mass index should be between 23-30. Your Body mass index is 39.92 kg/m. If this is out of the aforementioned range listed, please consider follow up with your Primary Care Provider.  If you are age 7 or younger, your body mass index should be between 19-25. Your Body mass index is 39.92 kg/m. If this is out of the aformentioned range listed, please consider follow up with your Primary Care Provider.   __________________________________________________________  The [facility] providers would like to encourage you to use [facility] to communicate with providers for non-urgent requests or questions.  Due to long hold times on the telephone, sending your provider a message by [facility] may be a faster and more efficient way to get a response.  Please allow 48 business hours for a response.  Please remember that this is for non-urgent requests.   Please go to the lab in the basement of our building to have lab work done as you leave today. Hit "B" for basement when you get on the elevator.  When the doors open the lab is on your left.  We will call you with the results. Thank you.  We will review your Cardiology visit notes from Friday to determine if you are cleared for EGD/Colonoscopy. We will contact you to discuss scheduling next week.  You will be due for a liver ultrasound in December. We will contacted by [facility] scheduling to schedule that appointment.  Their number is [phone]. If you don't hear from them, feel free to contact them for scheduling.  You will be due for a follow up appointment in February 2023. We will let you know when it is time to schedule.  Thank you for entrusting me with your care and for choosing [facility], Dr. [place] Cellar

## 2020-09-24 NOTE — Progress Notes (Signed)
HPI :  70 year old male with a history of colon polyps, aortic stenosis, CHF, A. fib, on Eliquis, referred by Lelon Frohlich, MD to be evaluated for suspected cirrhosis.  I have performed his colonoscopy in the past but have not seen him since January 2019.  He has been seen by Dr. Lorenso Courier and hematology for thrombocytopenia.  He had very mild thrombocytopenia in the past but had worsening in the past 2 years or so.  He was sent for an ultrasound in June which showed cirrhosis of the liver with mild splenomegaly.  He was referred here for further evaluation.  The patient denies any known history of liver disease in the past.  He denies any family history of liver disease.  He has historically drink an occasional beer but nothing routinely.  Denies any history of jaundice.  No history of internal bleeding or hepatic encephalopathy.  He does have a significant cardiac history.  Currently followed by cardiology for severe aortic stenosis, grade 3 diastolic dysfunction, atrial fibrillation on chronic Eliquis.  He does have significant lower extremity edema for which he is being treated with diuretics.  That being said he is quite active and likes to play golf.  Prior labs shows he tested negative for hepatitis B and C within the past year, otherwise has not had work-up for his liver disease.  Currently denies any shortness of breath or chest pain and appears to be ambulating okay.  He has never had a prior EGD.  He had a colonoscopy with me and 2017 at which point time he had 13 adenomas removed.  I had recommended a colonoscopy 1 year later.  In January 2019 he had a follow-up exam which was normal.  I have recommended a surveillance colonoscopy in 3 years.  He denies any problems with his bowels at present time.   US abdomen 07/23/20 - IMPRESSION: 1. Cirrhotic changes involving the liver without obvious hepatic mass. 2. No blood flow is demonstrated in the portal vein suggesting portal vein  thrombosis. 3. Gallbladder wall thickening possibly due to liver disease and low albumin. No gallstones. 4. Mild splenomegaly. 5. No ascites.   Echo 06/06/20 - EF 60-65%, grade III DD (restrictive), moderate to severe AS   Colonoscopy 02/22/2017 - Diverticulosis in the entire examined colon. - Medium-sized lipoma in the ascending colon. - Internal hemorrhoids. - There was significant looping of the colon. - The examination was otherwise normal. - No specimens collected.  Colonoscopy 09/24/2015 - One 5 mm polyp in the cecum, removed with a hot snare. Resected and retrieved. - Two 4 to 5 mm polyps in the ascending colon, removed with a cold snare. Resected and retrieved. - Two 4 to 5 mm polyps at the hepatic flexure, removed with a cold snare. Resected and retrieved. - Two 4 to 5 mm polyps in the transverse colon, removed with a cold snare. Resected and retrieved. - Two 4 to 5 mm polyps in the descending colon, removed with a cold snare. Resected and retrieved. - Three 3 to 5 mm polyps in the sigmoid colon, removed with a cold snare. Resected and retrieved. - One 6 mm polyp at the recto-sigmoid colon, removed with a hot snare. Resected and retrieved. - Diverticulosis in the entire examined colon. - Non-bleeding internal hemorrhoids. - The examination was otherwise normal.   Surgical [P], sigmoid, descending, hepatic flexure, cecum, ascending, transverse, rectosigmoid, polyp (13) - TUBULAR ADENOMA (9), SESSILE SERRATED ADENOMA (3) AND HYPERPLASTIC POLYP (1). NO HIGH GRADE  DYSPLASIA OR MALIGNANCY IDENTIFIED.    Past Medical History:  Diagnosis Date   Adjustment disorder with depressed mood 09/07/2007   Cirrhosis ([place])    Colon polyps    Tubular Adenoma 2010   CONGESTIVE HEART FAILURE 12/09/2008   CORONARY ARTERY DISEASE 2006   HYPERLIPIDEMIA 08/09/2006   HYPERTENSION 08/09/2006   MYOCARDIAL INFARCTION, HX OF 07/07/2004   NEPHROLITHIASIS, HX OF 08/09/2006   OBESITY  07/19/2008   Sleep apnea      Past Surgical History:  Procedure Laterality Date   CARDIOVERSION N/A 12/14/2017   Procedure: CARDIOVERSION;  Surgeon: [name], MD;  Location: [place];  Service: Cardiovascular;  Laterality: N/A;   CARDIOVERSION N/A 10/01/2019   Procedure: CARDIOVERSION;  Surgeon: Buford Dresser, MD;  Location: [facility] ENDOSCOPY;  Service: Cardiovascular;  Laterality: N/A;   COLONOSCOPY     CORONARY ARTERY BYPASS GRAFT  2006   Family History  Problem Relation Age of Onset   Hypertension Mother    Hyperlipidemia Mother    Heart disease Father    Ulcers Father    Bipolar disorder Brother    Colon cancer Neg Hx    Social History   Tobacco Use   Smoking status: Never   Smokeless tobacco: Never  Vaping Use   Vaping Use: Never used  Substance Use Topics   Alcohol use: Yes    Alcohol/week: 1.0 standard drink    Types: 1 Cans of beer per week    Comment: occasional   Drug use: No   Current Outpatient Medications  Medication Sig Dispense Refill   acetaminophen (TYLENOL) 500 MG tablet Take 1,000 mg by mouth daily as needed for moderate pain or headache.     apixaban (ELIQUIS) 5 MG TABS tablet TAKE 1 TABLET BY MOUTH TWICE A DAY 180 tablet 1   atorvastatin (LIPITOR) 80 MG tablet TAKE 1 TABLET BY MOUTH EVERY DAY 90 tablet 1   furosemide (LASIX) 20 MG tablet TAKE 3 TABLETS ('60MG'$ ) IN THE MORNING AND 2 TABLETS ('40MG'$ ) IN THE EVENING. 80 tablet 2   hydrocortisone 2.5 % cream Apply topically.     KLOR-CON M20 20 MEQ tablet TAKE 1 TABLET BY MOUTH EVERY DAY 90 tablet 3   metoprolol succinate (TOPROL-XL) 25 MG 24 hr tablet TAKE 1 TABLET BY MOUTH EVERY DAY WITH OR IMMEDIATELY FOLLOWING A MEAL 30 tablet 11   tamsulosin (FLOMAX) 0.4 MG CAPS capsule TAKE ONE CAPSULE BY MOUTH EVERY DAY AFTER SUPPER 90 capsule 1   vitamin B-12 (CYANOCOBALAMIN) 1000 MCG tablet Take 1 tablet (1,000 mcg total) by mouth daily. 30 tablet 0   No current facility-administered medications  for this visit.   No Known Allergies   Review of Systems: All systems reviewed and negative except where noted in HPI.   Lab Results  Component Value Date   WBC 4.7 07/14/2020   HGB 12.2 (L) 07/14/2020   HCT 36.7 (L) 07/14/2020   MCV 94.6 07/14/2020   PLT 70 (L) 07/14/2020    Lab Results  Component Value Date   CREATININE 0.83 07/14/2020   BUN 13 07/14/2020   NA 140 07/14/2020   K 3.9 07/14/2020   CL 105 07/14/2020   CO2 26 07/14/2020    Lab Results  Component Value Date   ALT 24 07/14/2020   AST 48 (H) 07/14/2020   ALKPHOS 79 07/14/2020   BILITOT 2.7 (H) 07/14/2020     Physical Exam: BP (!) 132/54 (BP Location: Left Arm, Patient Position: Sitting, Cuff Size: Normal)  Pulse 64   Ht 5' 3.25" (1.607 m) Comment: height measured without shoes  Wt 227 lb 2 oz (103 kg)   BMI 39.92 kg/m  Constitutional: Pleasant,well-developed, male in no acute distress. HEENT: Normocephalic and atraumatic. Conjunctivae are normal. No scleral icterus. Neck supple.  Cardiovascular: Normal rate, regular rhythm. 3/6 SEM  Pulmonary/chest: Effort normal and breath sounds normal.  Abdominal: Soft, nondistended, nontender. There are no masses palpable.  Extremities: (+) LE edema Lymphadenopathy: No cervical adenopathy noted. Neurological: Alert and oriented to person place and time. Skin: Skin is warm and dry. No rashes noted. Psychiatric: Normal mood and affect. Behavior is normal.   ASSESSMENT AND PLAN: 70 year old male here for reassessment of the following:  Cirrhosis History of colon polyps  As above he had undergone evaluation for worsening thrombocytopenia and ultimately found to have cirrhotic appearing liver on ultrasound, which is the most likely cause for his thrombocytopenia.  This is a new diagnosis for him but suspect is been going on for some time.  We discussed what cirrhosis is, risks for decompensation and risks for HCC.  He appears compensated at this time.  He will  need a serologic work-up to rule out chronic liver diseases.  I will also check his INR and AFP for baseline.  We discussed risk for [facility] and he will need another ultrasound December for continued screening.  We discussed risk for esophageal varices, he has never had an upper endoscopy and that would be reasonable to screen him however, he has severe aortic stenosis and undergoing cardiology work-up at this time.  I will await his cardiology follow-up later this week and we will determine if endoscopy is something he would be cleared to have done in the next few months.  If he is ultimately thought to be too high risk for anesthesia at this time we could empirically switch his metoprolol to nadolol to cover for possible varices if that would be acceptable with his cardiology team.  He is otherwise due for surveillance colonoscopy given his numerous polyps on his initial colonoscopy, again will await for clearance to have anesthesia.  If he is cleared to have these procedures done they will need to be done at the hospital given his cardiac history and severe aortic stenosis.  Otherwise counseled him to avoid NSAIDs and minimize alcohol use.  We discussed etiologies for his cirrhosis, perhaps could be fatty liver but will await his serologic work-up.  Plan: - AFP, INR, chronic liver disease labs, check for immunity to hep A (not immune to hep B - will vaccinate pending results of labs) - awaiting cardiology follow-up to determine if he is cleared for endoscopy and colonoscopy at the hospital - if not cleared for endoscopy will discuss with cardiology if we can switch metoprolol to nadolol - right upper quadrant ultrasound for [facility] screening in December - minimize NSAID use and alcohol - follow up in 6 months  Jolly Mango, MD [facility]  CC: Isaac Bliss, [place]*

## 2020-09-26 ENCOUNTER — Ambulatory Visit: Payer: 59 | Admitting: Cardiology

## 2020-09-26 ENCOUNTER — Other Ambulatory Visit: Payer: Self-pay

## 2020-09-26 ENCOUNTER — Encounter: Payer: Self-pay | Admitting: Cardiology

## 2020-09-26 VITALS — BP 142/56 | HR 62 | Ht 66.0 in | Wt 227.2 lb

## 2020-09-26 DIAGNOSIS — I10 Essential (primary) hypertension: Secondary | ICD-10-CM | POA: Diagnosis not present

## 2020-09-26 DIAGNOSIS — I480 Paroxysmal atrial fibrillation: Secondary | ICD-10-CM | POA: Diagnosis not present

## 2020-09-26 DIAGNOSIS — I2510 Atherosclerotic heart disease of native coronary artery without angina pectoris: Secondary | ICD-10-CM

## 2020-09-26 DIAGNOSIS — I5032 Chronic diastolic (congestive) heart failure: Secondary | ICD-10-CM | POA: Diagnosis not present

## 2020-09-26 DIAGNOSIS — I350 Nonrheumatic aortic (valve) stenosis: Secondary | ICD-10-CM

## 2020-09-26 NOTE — Patient Instructions (Signed)
  Testing/Procedures:  Your physician has requested that you have an echocardiogram. Echocardiography is a painless test that uses sound waves to create images of your heart. It provides your doctor with information about the size and shape of your heart and how well your heart's chambers and valves are working. This procedure takes approximately one hour. There are no restrictions for this procedure. [place]   Follow-Up: At [facility], you and your health needs are our priority.  As part of our continuing mission to provide you with exceptional heart care, we have created designated Provider Care Teams.  These Care Teams include your primary Cardiologist (physician) and Advanced Practice Providers (APPs -  Physician Assistants and Nurse Practitioners) who all work together to provide you with the care you need, when you need it.  We recommend signing up for the patient portal called "MyChart".  Sign up information is provided on this After Visit Summary.  MyChart is used to connect with patients for Virtual Visits (Telemedicine).  Patients are able to view lab/test results, encounter notes, upcoming appointments, etc.  Non-urgent messages can be sent to your provider as well.   To learn more about what you can do with MyChart, go to [email or web address].    Your next appointment:    November AFTER ECHO COMPLETE WITH DR Stanford Breed

## 2020-09-27 LAB — BASIC METABOLIC PANEL
BUN/Creatinine Ratio: 12 (ref 10–24)
BUN: 10 mg/dL (ref 8–27)
CO2: 26 mmol/L (ref 20–29)
Calcium: 9.8 mg/dL (ref 8.6–10.2)
Chloride: 107 mmol/L — ABNORMAL HIGH (ref 96–106)
Creatinine, Ser: 0.82 mg/dL (ref 0.76–1.27)
Glucose: 126 mg/dL — ABNORMAL HIGH (ref 65–99)
Potassium: 4.8 mmol/L (ref 3.5–5.2)
Sodium: 142 mmol/L (ref 134–144)
eGFR: 94 mL/min/{1.73_m2} (ref >59)

## 2020-09-27 LAB — PRO B NATRIURETIC PEPTIDE: NT-Pro BNP: 319 pg/mL (ref 0–376)

## 2020-09-29 LAB — ALPHA-1-ANTITRYPSIN: A-1 Antitrypsin, Ser: 152 mg/dL (ref 83–199)

## 2020-09-29 LAB — AFP TUMOR MARKER: AFP-Tumor Marker: 2.5 ng/mL (ref <6.1)

## 2020-09-29 LAB — ANA: Anti Nuclear Antibody (ANA): NEGATIVE

## 2020-09-29 LAB — MITOCHONDRIAL ANTIBODIES: Mitochondrial M2 Ab, IgG: <=20 U

## 2020-09-29 LAB — HEPATITIS A ANTIBODY, TOTAL: Hepatitis A AB,Total: NONREACTIVE

## 2020-09-29 LAB — IGG: IgG (Immunoglobin G), Serum: 2164 mg/dL — ABNORMAL HIGH (ref 600–1540)

## 2020-09-29 LAB — ANTI-SMOOTH MUSCLE ANTIBODY, IGG: Actin (Smooth Muscle) Antibody (IGG): <20 U (ref <20)

## 2020-09-30 ENCOUNTER — Other Ambulatory Visit: Payer: Self-pay

## 2020-09-30 DIAGNOSIS — Z86010 Personal history of colonic polyps: Secondary | ICD-10-CM

## 2020-09-30 DIAGNOSIS — K7460 Unspecified cirrhosis of liver: Secondary | ICD-10-CM

## 2020-10-10 ENCOUNTER — Ambulatory Visit (INDEPENDENT_AMBULATORY_CARE_PROVIDER_SITE_OTHER): Payer: 59 | Admitting: Gastroenterology

## 2020-10-10 DIAGNOSIS — K7460 Unspecified cirrhosis of liver: Secondary | ICD-10-CM

## 2020-10-10 DIAGNOSIS — Z23 Encounter for immunization: Secondary | ICD-10-CM

## 2020-10-16 ENCOUNTER — Other Ambulatory Visit: Payer: Self-pay | Admitting: Hematology and Oncology

## 2020-10-16 ENCOUNTER — Inpatient Hospital Stay: Payer: 59 | Attending: Hematology and Oncology | Admitting: Hematology and Oncology

## 2020-10-16 ENCOUNTER — Inpatient Hospital Stay: Payer: 59

## 2020-10-16 ENCOUNTER — Other Ambulatory Visit: Payer: Self-pay

## 2020-10-16 VITALS — BP 146/57 | HR 61 | Temp 97.5°F | Resp 16 | Ht 66.0 in | Wt 226.7 lb

## 2020-10-16 DIAGNOSIS — D696 Thrombocytopenia, unspecified: Secondary | ICD-10-CM | POA: Diagnosis not present

## 2020-10-16 DIAGNOSIS — D6959 Other secondary thrombocytopenia: Secondary | ICD-10-CM | POA: Insufficient documentation

## 2020-10-16 DIAGNOSIS — K7460 Unspecified cirrhosis of liver: Secondary | ICD-10-CM | POA: Diagnosis present

## 2020-10-16 LAB — CMP (CANCER CENTER ONLY)
ALT: 20 U/L (ref 0–44)
AST: 34 U/L (ref 15–41)
Albumin: 3.3 g/dL — ABNORMAL LOW (ref 3.5–5.0)
Alkaline Phosphatase: 70 U/L (ref 38–126)
Anion gap: 8 (ref 5–15)
BUN: 13 mg/dL (ref 8–23)
CO2: 25 mmol/L (ref 22–32)
Calcium: 9.5 mg/dL (ref 8.9–10.3)
Chloride: 106 mmol/L (ref 98–111)
Creatinine: 0.82 mg/dL (ref 0.61–1.24)
GFR, Estimated: >60 mL/min (ref >60)
Glucose, Bld: 110 mg/dL — ABNORMAL HIGH (ref 70–99)
Potassium: 4.6 mmol/L (ref 3.5–5.1)
Sodium: 139 mmol/L (ref 135–145)
Total Bilirubin: 2.1 mg/dL — ABNORMAL HIGH (ref 0.3–1.2)
Total Protein: 7.4 g/dL (ref 6.5–8.1)

## 2020-10-16 LAB — CBC WITH DIFFERENTIAL (CANCER CENTER ONLY)
Abs Immature Granulocytes: 0.01 10*3/uL (ref 0.00–0.07)
Basophils Absolute: 0 10*3/uL (ref 0.0–0.1)
Basophils Relative: 1 %
Eosinophils Absolute: 0.2 10*3/uL (ref 0.0–0.5)
Eosinophils Relative: 3 %
HCT: 37 % — ABNORMAL LOW (ref 39.0–52.0)
Hemoglobin: 12.8 g/dL — ABNORMAL LOW (ref 13.0–17.0)
Immature Granulocytes: 0 %
Lymphocytes Relative: 31 %
Lymphs Abs: 1.4 10*3/uL (ref 0.7–4.0)
MCH: 31.6 pg (ref 26.0–34.0)
MCHC: 34.6 g/dL (ref 30.0–36.0)
MCV: 91.4 fL (ref 80.0–100.0)
Monocytes Absolute: 0.3 10*3/uL (ref 0.1–1.0)
Monocytes Relative: 7 %
Neutro Abs: 2.5 10*3/uL (ref 1.7–7.7)
Neutrophils Relative %: 58 %
Platelet Count: 82 10*3/uL — ABNORMAL LOW (ref 150–400)
RBC: 4.05 MIL/uL — ABNORMAL LOW (ref 4.22–5.81)
RDW: 15 % (ref 11.5–15.5)
WBC Count: 4.4 10*3/uL (ref 4.0–10.5)
nRBC: 0 % (ref 0.0–0.2)

## 2020-10-16 NOTE — Progress Notes (Signed)
[name] [phone]   [phone]  PROGRESS NOTE  Patient Care Team: Isaac Bliss, Rayford Halsted, MD as PCP - General (Internal Medicine) Lelon Perla, MD as PCP - Cardiology (Cardiology)  Hematological/Oncological History # Thrombocytopenia 2/2 to Cirrhosis 1) 07/11/2009: WBC 8.9, Hgb 15.1, MCV 93.7, Plt 188 2) 09/22/2010: WBC 6.3, Hgb 14.4, MCV 95.1, Plt 135 3) 08/01/2015: WBC 5.6, Hgb 12.7, MCV 90.8, Plt 86 4) 09/05/2017: WBC 4.4, Hgb 11.9, MCV 92, Plt 91 5) 11/24/2018: WBC 3.9, Hgb 12.1, MCV 91, Plt 82 6) 11/30/2019: WbC 5.0, Hgb 13.5, MCV 95.1, Plt 91 7) 01/07/2020: establish care with Dr. Lorenso Courier  8) 07/14/2020: WBC 4.7, Hgb 12.2, Plt 70, MCV 94.6 9) 10/16/2020: WBC 4.4, Hgb 12.8, Plt 82, MCV 91.4  Interval History:  [name] 70 y.o. male with medical history significant for thrombocytopenia of unclear etiolgoy who presents for a follow up visit. The patient's last visit was on 07/23/2020. In the interim since the last visit he has reconnected with Dr. Havery Moros and has begun work-up and evaluation of his cirrhosis.  On exam today [name] reports he has made changes to his diet and attempts to better control his weight and his sugars.  He notes that he is feeling better with these interventions.  He is doing his best to exercise as well.  He denies any overt signs of bleeding, bruising, or dark stools.  He notes he continues to work and enjoys his job very much.  He otherwise denies any fevers, chills, sweats, nausea, vomiting or diarrhea.  A full 10 point ROS is listed below.  MEDICAL HISTORY:  Past Medical History:  Diagnosis Date   Adjustment disorder with depressed mood 09/07/2007   Cirrhosis ([place])    Colon polyps    Tubular Adenoma 2010   CONGESTIVE HEART FAILURE 12/09/2008   CORONARY ARTERY DISEASE 2006   HYPERLIPIDEMIA 08/09/2006   HYPERTENSION 08/09/2006   MYOCARDIAL INFARCTION, HX OF 07/07/2004   NEPHROLITHIASIS, HX  OF 08/09/2006   OBESITY 07/19/2008   Sleep apnea     SURGICAL HISTORY: Past Surgical History:  Procedure Laterality Date   CARDIOVERSION N/A 12/14/2017   Procedure: CARDIOVERSION;  Surgeon: [name], MD;  Location: [place];  Service: Cardiovascular;  Laterality: N/A;   CARDIOVERSION N/A 10/01/2019   Procedure: CARDIOVERSION;  Surgeon: Buford Dresser, MD;  Location: [facility] ENDOSCOPY;  Service: Cardiovascular;  Laterality: N/A;   COLONOSCOPY     CORONARY ARTERY BYPASS GRAFT  2006    SOCIAL HISTORY: Social History   Socioeconomic History   Marital status: Single    Spouse name: Not on file   Number of children: 0   Years of education: Not on file   Highest education level: Not on file  Occupational History   Occupation: [employer], jewellery  Tobacco Use   Smoking status: Never   Smokeless tobacco: Never  Vaping Use   Vaping Use: Never used  Substance and Sexual Activity   Alcohol use: Yes    Alcohol/week: 1.0 standard drink    Types: 1 Cans of beer per week    Comment: occasional   Drug use: No   Sexual activity: Not on file  Other Topics Concern   Not on file  Social History Narrative   Not on file   Social Determinants of Health   Financial Resource Strain: Not on file  Food Insecurity: Not on file  Transportation Needs: Not on file  Physical Activity: Not on file  Stress: Not on file  Social Connections: Not on file  Intimate Partner Violence: Not on file    FAMILY HISTORY: Family History  Problem Relation Age of Onset   Hypertension Mother    Hyperlipidemia Mother    Heart disease Father    Ulcers Father    Bipolar disorder Brother    Colon cancer Neg Hx     ALLERGIES:  has No Known Allergies.  MEDICATIONS:  Current Outpatient Medications  Medication Sig Dispense Refill   acetaminophen (TYLENOL) 500 MG tablet Take 1,000 mg by mouth daily as needed for moderate pain or headache.     apixaban (ELIQUIS) 5 MG TABS  tablet TAKE 1 TABLET BY MOUTH TWICE A DAY 180 tablet 1   atorvastatin (LIPITOR) 80 MG tablet TAKE 1 TABLET BY MOUTH EVERY DAY 90 tablet 1   furosemide (LASIX) 20 MG tablet TAKE 3 TABLETS ('60MG'$ ) IN THE MORNING AND 2 TABLETS ('40MG'$ ) IN THE EVENING. 80 tablet 2   hydrocortisone 2.5 % cream Apply topically.     KLOR-CON M20 20 MEQ tablet TAKE 1 TABLET BY MOUTH EVERY DAY 90 tablet 3   metoprolol succinate (TOPROL-XL) 25 MG 24 hr tablet TAKE 1 TABLET BY MOUTH EVERY DAY WITH OR IMMEDIATELY FOLLOWING A MEAL 30 tablet 11   tamsulosin (FLOMAX) 0.4 MG CAPS capsule TAKE ONE CAPSULE BY MOUTH EVERY DAY AFTER SUPPER 90 capsule 1   vitamin B-12 (CYANOCOBALAMIN) 1000 MCG tablet Take 1 tablet (1,000 mcg total) by mouth daily. 30 tablet 0   No current facility-administered medications for this visit.    REVIEW OF SYSTEMS:   Constitutional: ( - ) fevers, ( - )  chills , ( - ) night sweats Eyes: ( - ) blurriness of vision, ( - ) double vision, ( - ) watery eyes Ears, nose, mouth, throat, and face: ( - ) mucositis, ( - ) sore throat Respiratory: ( - ) cough, ( - ) dyspnea, ( - ) wheezes Cardiovascular: ( - ) palpitation, ( - ) chest discomfort, ( - ) lower extremity swelling Gastrointestinal:  ( - ) nausea, ( - ) heartburn, ( - ) change in bowel habits Skin: ( - ) abnormal skin rashes Lymphatics: ( - ) new lymphadenopathy, ( - ) easy bruising Neurological: ( - ) numbness, ( - ) tingling, ( - ) new weaknesses Behavioral/Psych: ( - ) mood change, ( - ) new changes  All other systems were reviewed with the patient and are negative.  PHYSICAL EXAMINATION: Vitals:   10/16/20 1456  BP: (!) 146/57  Pulse: 61  Resp: 16  Temp: (!) 97.5 F (36.4 C)  SpO2: 95%   Filed Weights   10/16/20 1456  Weight: 226 lb 11.2 oz (102.8 kg)    GENERAL: well appearing elderly Caucasian male. Alert, no distress and comfortable SKIN: skin color, texture, turgor are normal, no rashes or significant lesions EYES: conjunctiva  are pink and non-injected, sclera clear LUNGS: clear to auscultation and percussion with normal breathing effort HEART: regular rate & rhythm and no murmurs and +2 lower extremity edema Musculoskeletal: no cyanosis of digits and no clubbing  PSYCH: alert & oriented x 3, fluent speech NEURO: no focal motor/sensory deficits  LABORATORY DATA:  I have reviewed the data as listed CBC Latest Ref Rng & Units 10/16/2020 07/14/2020 06/09/2020  WBC 4.0 - 10.5 K/uL 4.4 4.7 4.6  Hemoglobin 13.0 - 17.0 g/dL 12.8(L) 12.2(L) 12.5(L)  Hematocrit 39.0 - 52.0 % 37.0(L) 36.7(L) 36.4(L)  Platelets 150 -  400 K/uL 82(L) 70(L) 92(LL)    CMP Latest Ref Rng & Units 10/16/2020 09/26/2020 07/14/2020  Glucose 70 - 99 mg/dL 110(H) 126(H) 114(H)  BUN 8 - 23 mg/dL '13 10 13  '$ Creatinine 0.61 - 1.24 mg/dL 0.82 0.82 0.83  Sodium 135 - 145 mmol/L 139 142 140  Potassium 3.5 - 5.1 mmol/L 4.6 4.8 3.9  Chloride 98 - 111 mmol/L 106 107(H) 105  CO2 22 - 32 mmol/L '25 26 26  '$ Calcium 8.9 - 10.3 mg/dL 9.5 9.8 9.2  Total Protein 6.5 - 8.1 g/dL 7.4 - 7.5  Total Bilirubin 0.3 - 1.2 mg/dL 2.1(H) - 2.7(H)  Alkaline Phos 38 - 126 U/L 70 - 79  AST 15 - 41 U/L 34 - 48(H)  ALT 0 - 44 U/L 20 - 24    No results found for: MPROTEIN No results found for: KPAFRELGTCHN, LAMBDASER, KAPLAMBRATIO   RADIOGRAPHIC STUDIES: No results found.  ASSESSMENT & PLAN [name] 70 y.o. male with medical history significant for thrombocytopenia 2/2 to cirrhosis who presents for a follow up visit.    After review the labs, the records, schedule the patient the findings are most consistent with thrombocytopenia secondary to cirrhosis of the liver.  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  The patient has been reconnected with his GI doctor Dr. Havery Moros who is currently working on his cirrhosis.  At this time given the clear etiology of the thrombocytopenia I do not believe there is any need for routine  follow-up in the clinic.  We are happy to be available in the event that he would develop any new or worsening issues regarding his blood counts.  #Thrombocytopenia, chronic #Mild Vitamin B12 deficieincy --Findings are consistent with thrombocytopenia secondary to cirrhosis --Recommend holding Eliquis therapy if his platelets were to drop below 50 --Please call our clinic in the event the patient were to develop any bleeding or serious changes in blood counts. --In the event the patient had a platelet count less than 50 and required surgical intervention or procedure please let us know as we could arrange for TPO mimetic therapy in order to help boost his counts. --There is no need for routine follow-up in our clinic, though we would happily see him back in the event that there is anything we can contribute to his care.  No orders of the defined types were placed in this encounter.   All questions were answered. The patient knows to call the clinic with any problems, questions or concerns.  A total of more than 25 minutes were spent on this encounter with face-to-face time and non-face-to-face time, including preparing to see the patient, ordering tests and/or medications, counseling the patient and coordination of care as outlined above.   Ledell Peoples, MD Department of Hematology/Oncology [place] [facility] Phone: [phone] Pager: [phone] Email: [email or web address]  10/16/2020 4:54 PM

## 2020-11-03 ENCOUNTER — Other Ambulatory Visit: Payer: Self-pay | Admitting: Cardiology

## 2020-11-03 DIAGNOSIS — I5042 Chronic combined systolic (congestive) and diastolic (congestive) heart failure: Secondary | ICD-10-CM

## 2020-11-05 ENCOUNTER — Other Ambulatory Visit: Payer: Self-pay

## 2020-11-05 ENCOUNTER — Ambulatory Visit (AMBULATORY_SURGERY_CENTER): Payer: 59 | Admitting: *Deleted

## 2020-11-05 VITALS — Ht 66.0 in | Wt 220.0 lb

## 2020-11-05 DIAGNOSIS — K7460 Unspecified cirrhosis of liver: Secondary | ICD-10-CM

## 2020-11-05 DIAGNOSIS — Z86010 Personal history of colonic polyps: Secondary | ICD-10-CM

## 2020-11-05 MED ORDER — NA SULFATE-K SULFATE-MG SULF 17.5-3.13-1.6 GM/177ML PO SOLN: 1.0000 | Freq: Once | ORAL | 0 refills | Status: AC

## 2020-11-05 NOTE — Progress Notes (Signed)
No egg or soy allergy known to patient  No issues known to pt with past sedation with any surgeries or procedures Patient denies ever being told they had issues or difficulty with intubation  No FH of Malignant Hyperthermia Pt is not on diet pills Pt is not on  home 02  Pt is on blood thinners - Eliquis - we have a 2 day hold for Eliquis  Pt denies issues with constipation  A fib hx - on Eliquis   Pt is fully vaccinated  for Covid  NO PA's for preps discussed with pt In PV today  Discussed with pt there will be an out-of-pocket cost for prep and that varies from $0 to 70 +  dollars - pt verbalized understanding   Due to the COVID-19 pandemic we are asking patients to follow certain guidelines in PV and the [place]   Pt aware of COVID protocols and [facility] guidelines  Pt verified name, DOB, address and insurance during PV today.  Pt mailed instruction packet of Emmi video, copy of consent form to read and not return, and instructions.  PV completed over the phone.  Pt encouraged to call with questions or issues.  My Chart instructions to pt as well  -pt aware

## 2020-11-06 ENCOUNTER — Other Ambulatory Visit: Payer: Self-pay | Admitting: Cardiology

## 2020-11-06 ENCOUNTER — Ambulatory Visit: Payer: 59 | Admitting: Internal Medicine

## 2020-11-06 ENCOUNTER — Encounter: Payer: Self-pay | Admitting: Internal Medicine

## 2020-11-06 VITALS — BP 122/64 | HR 89 | Temp 98.7°F | Wt 220.0 lb

## 2020-11-06 DIAGNOSIS — K7460 Unspecified cirrhosis of liver: Secondary | ICD-10-CM

## 2020-11-06 DIAGNOSIS — R2681 Unsteadiness on feet: Secondary | ICD-10-CM | POA: Diagnosis not present

## 2020-11-06 DIAGNOSIS — Z23 Encounter for immunization: Secondary | ICD-10-CM | POA: Diagnosis not present

## 2020-11-06 DIAGNOSIS — R42 Dizziness and giddiness: Secondary | ICD-10-CM

## 2020-11-06 DIAGNOSIS — D696 Thrombocytopenia, unspecified: Secondary | ICD-10-CM | POA: Diagnosis not present

## 2020-11-06 NOTE — Progress Notes (Signed)
Established Patient Office Visit     This visit occurred during the SARS-CoV-2 public health emergency.  Safety protocols were in place, including screening questions prior to the visit, additional usage of staff PPE, and extensive cleaning of exam room while observing appropriate contact time as indicated for disinfecting solutions.    CC/Reason for Visit: Follow-up chronic conditions, discuss dizziness  HPI: [name] is a 70 y.o. male who is coming in today for the above mentioned reasons. Past Medical History is significant for: aortic valve stenosis followed by Dr. Stanford Breed, atrial fibrillation status post DCCV on Eliquis and Toprol, obstructive sleep apnea on nightly CPAP, hypertension, hyperlipidemia, morbid obesity.  During his physical last year he was noted to be thrombocytopenic which upon chart review was chronic.  He was referred to hematology for evaluation.  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 Havery Moros.  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  He has been complaining of balance issues, his gait has been unsteady, he will at times have vertigo.  He was told in the past "there might have been crystals in my ears".  He is requesting a flu vaccine.  He is due for CPE.   Past Medical/Surgical History: Past Medical History:  Diagnosis Date   Adjustment disorder with depressed mood 09/07/2007   Cataract    removed bilaterally   Chronic kidney disease    kidney stones   Cirrhosis ([place])    Colon polyps    Tubular Adenoma 2010   CONGESTIVE HEART FAILURE 12/09/2008   CORONARY ARTERY DISEASE 2006   HYPERLIPIDEMIA 08/09/2006   HYPERTENSION 08/09/2006   MYOCARDIAL INFARCTION, HX OF 07/07/2004   NEPHROLITHIASIS, HX OF 08/09/2006   OBESITY 07/19/2008   Sleep apnea    has a cpap - not using religiously    Past Surgical History:  Procedure  Laterality Date   CARDIOVERSION N/A 12/14/2017   Procedure: CARDIOVERSION;  Surgeon: [name], MD;  Location: [facility];  Service: Cardiovascular;  Laterality: N/A;   CARDIOVERSION N/A 10/01/2019   Procedure: CARDIOVERSION;  Surgeon: Buford Dresser, MD;  Location: [facility];  Service: Cardiovascular;  Laterality: N/A;   COLONOSCOPY     CORONARY ARTERY BYPASS GRAFT  2006   POLYPECTOMY      Social History:  reports that he has never smoked. He has never used smokeless tobacco. He reports current alcohol use of about 1.0 standard drink per week. He reports that he does not use drugs.  Allergies: No Known Allergies  Family History:  Family History  Problem Relation Age of Onset   Hypertension Mother    Hyperlipidemia Mother    Heart disease Father    Ulcers Father    Bipolar disorder Brother    Colon cancer Neg Hx    Colon polyps Neg Hx    Esophageal cancer Neg Hx    Rectal cancer Neg Hx    Stomach cancer Neg Hx      Current Outpatient Medications:    acetaminophen (TYLENOL) 500 MG tablet, Take 1,000 mg by mouth daily as needed for moderate pain or headache., Disp: , Rfl:    apixaban (ELIQUIS) 5 MG TABS tablet, TAKE 1 TABLET BY MOUTH TWICE A DAY, Disp: 180 tablet, Rfl: 1   atorvastatin (LIPITOR) 80 MG tablet, TAKE 1 TABLET BY MOUTH EVERY DAY, Disp: 90 tablet, Rfl: 1   furosemide (LASIX) 20 MG  tablet, TAKE 3 TABLETS (60MG ) IN THE MORNING AND 2 TABLETS (40MG ) IN THE EVENING., Disp: 80 tablet, Rfl: 2   hydrocortisone 2.5 % cream, Apply topically., Disp: , Rfl:    KLOR-CON M20 20 MEQ tablet, TAKE 1 TABLET BY MOUTH EVERY DAY, Disp: 90 tablet, Rfl: 3   metoprolol succinate (TOPROL-XL) 25 MG 24 hr tablet, TAKE 1 TABLET BY MOUTH EVERY DAY WITH OR IMMEDIATELY FOLLOWING A MEAL, Disp: 30 tablet, Rfl: 11   tamsulosin (FLOMAX) 0.4 MG CAPS capsule, TAKE ONE CAPSULE BY MOUTH EVERY DAY AFTER SUPPER, Disp: 90 capsule, Rfl: 1   vitamin B-12 (CYANOCOBALAMIN) 1000 MCG tablet,  Take 1 tablet (1,000 mcg total) by mouth daily., Disp: 30 tablet, Rfl: 0  Review of Systems:  Constitutional: Denies fever, chills, diaphoresis, appetite change and fatigue.  HEENT: Denies photophobia, eye pain, redness,  congestion, sore throat, rhinorrhea, sneezing, mouth sores, trouble swallowing, neck pain, neck stiffness and tinnitus.   Respiratory: Denies SOB, DOE, cough, chest tightness,  and wheezing.   Cardiovascular: Denies chest pain, palpitations and leg swelling.  Gastrointestinal: Denies nausea, vomiting, abdominal pain, diarrhea, constipation, blood in stool and abdominal distention.  Genitourinary: Denies dysuria, urgency, frequency, hematuria, flank pain and difficulty urinating.  Endocrine: Denies: hot or cold intolerance, sweats, changes in hair or nails, polyuria, polydipsia. Musculoskeletal: Denies myalgias, back pain, joint swelling, arthralgias and gait problem.  Skin: Denies pallor, rash and wound.  Neurological: Denies , seizures, syncope, weakness, numbness and headaches.  Hematological: Denies adenopathy. Easy bruising, personal or family bleeding history  Psychiatric/Behavioral: Denies suicidal ideation, mood changes, confusion, nervousness, sleep disturbance and agitation    Physical Exam: Vitals:   11/06/20 1458  BP: 122/64  Pulse: 89  Temp: 98.7 F (37.1 C)  TempSrc: Oral  SpO2: 92%  Weight: 220 lb (99.8 kg)    Body mass index is 35.51 kg/m.   Constitutional: NAD, calm, comfortable Eyes: PERRL, lids and conjunctivae normal, wears corrective lenses ENMT: Mucous membranes are moist.  Respiratory: clear to auscultation bilaterally, no wheezing, no crackles. Normal respiratory effort. No accessory muscle use.  Cardiovascular: Regular rate and rhythm, strong systolic murmur, no rubs / gallops. No extremity edema. Neurologic: Grossly intact and nonfocal Psychiatric: Normal judgment and insight. Alert and oriented x 3. Normal mood.    Impression and  Plan:  Vertigo Unsteady gait  - Plan: PT vestibular rehab, Vitamin B12  Thrombocytopenia, unspecified (HCC) -Secondary to liver cirrhosis, platelet count 82 at last check.  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  Need for influenza vaccination -Flu vaccine administered today.  Time spent: 31 minutes reviewing chart, interviewing and examining patient and formulating plan of care.    Lelon Frohlich, MD [facility]

## 2020-11-06 NOTE — Addendum Note (Signed)
Addended by: Westley Hummer B on: 11/06/2020 04:02 PM   Modules accepted: Orders

## 2020-11-07 ENCOUNTER — Encounter (HOSPITAL_COMMUNITY): Payer: Self-pay | Admitting: Gastroenterology

## 2020-11-07 LAB — VITAMIN B12: Vitamin B-12: 1008 pg/mL — ABNORMAL HIGH (ref 211–911)

## 2020-11-10 ENCOUNTER — Other Ambulatory Visit: Payer: Self-pay | Admitting: Gastroenterology

## 2020-11-10 ENCOUNTER — Ambulatory Visit (INDEPENDENT_AMBULATORY_CARE_PROVIDER_SITE_OTHER): Payer: 59 | Admitting: Gastroenterology

## 2020-11-10 DIAGNOSIS — Z23 Encounter for immunization: Secondary | ICD-10-CM | POA: Diagnosis not present

## 2020-11-17 ENCOUNTER — Telehealth: Payer: Self-pay

## 2020-11-17 DIAGNOSIS — R2681 Unsteadiness on feet: Secondary | ICD-10-CM

## 2020-11-17 DIAGNOSIS — R42 Dizziness and giddiness: Secondary | ICD-10-CM

## 2020-11-17 NOTE — Telephone Encounter (Signed)
Patient called asking about referral and after checking the order for Pt vertigo has to be changed to a referral

## 2020-11-18 ENCOUNTER — Other Ambulatory Visit: Payer: Self-pay

## 2020-11-18 ENCOUNTER — Encounter (HOSPITAL_COMMUNITY)
Admission: RE | Disposition: A | Discharge status: Home / Self Care | Payer: Self-pay | Source: Home / Self Care | Attending: Gastroenterology

## 2020-11-18 ENCOUNTER — Ambulatory Visit (HOSPITAL_COMMUNITY)
Admission: RE | Admit: 2020-11-18 | Discharge: 2020-11-18 | Disposition: A | Discharge status: Home / Self Care | Payer: 59 | Attending: Gastroenterology | Admitting: Gastroenterology

## 2020-11-18 ENCOUNTER — Ambulatory Visit (HOSPITAL_COMMUNITY): Payer: 59 | Admitting: Certified Registered Nurse Anesthetist

## 2020-11-18 ENCOUNTER — Encounter (HOSPITAL_COMMUNITY): Payer: Self-pay | Admitting: Gastroenterology

## 2020-11-18 DIAGNOSIS — Z79899 Other long term (current) drug therapy: Secondary | ICD-10-CM | POA: Diagnosis not present

## 2020-11-18 DIAGNOSIS — I8500 Esophageal varices without bleeding: Secondary | ICD-10-CM

## 2020-11-18 DIAGNOSIS — K2990 Gastroduodenitis, unspecified, without bleeding: Secondary | ICD-10-CM

## 2020-11-18 DIAGNOSIS — I509 Heart failure, unspecified: Secondary | ICD-10-CM | POA: Insufficient documentation

## 2020-11-18 DIAGNOSIS — K648 Other hemorrhoids: Secondary | ICD-10-CM | POA: Insufficient documentation

## 2020-11-18 DIAGNOSIS — K3189 Other diseases of stomach and duodenum: Secondary | ICD-10-CM | POA: Diagnosis not present

## 2020-11-18 DIAGNOSIS — K2970 Gastritis, unspecified, without bleeding: Secondary | ICD-10-CM

## 2020-11-18 DIAGNOSIS — D126 Benign neoplasm of colon, unspecified: Secondary | ICD-10-CM

## 2020-11-18 DIAGNOSIS — N189 Chronic kidney disease, unspecified: Secondary | ICD-10-CM | POA: Insufficient documentation

## 2020-11-18 DIAGNOSIS — Z8249 Family history of ischemic heart disease and other diseases of the circulatory system: Secondary | ICD-10-CM | POA: Diagnosis not present

## 2020-11-18 DIAGNOSIS — Z8349 Family history of other endocrine, nutritional and metabolic diseases: Secondary | ICD-10-CM | POA: Diagnosis not present

## 2020-11-18 DIAGNOSIS — K269 Duodenal ulcer, unspecified as acute or chronic, without hemorrhage or perforation: Secondary | ICD-10-CM | POA: Diagnosis not present

## 2020-11-18 DIAGNOSIS — Z86010 Personal history of colonic polyps: Secondary | ICD-10-CM

## 2020-11-18 DIAGNOSIS — K5730 Diverticulosis of large intestine without perforation or abscess without bleeding: Secondary | ICD-10-CM | POA: Diagnosis not present

## 2020-11-18 DIAGNOSIS — D123 Benign neoplasm of transverse colon: Secondary | ICD-10-CM | POA: Diagnosis not present

## 2020-11-18 DIAGNOSIS — I868 Varicose veins of other specified sites: Secondary | ICD-10-CM | POA: Diagnosis not present

## 2020-11-18 DIAGNOSIS — K7460 Unspecified cirrhosis of liver: Secondary | ICD-10-CM | POA: Diagnosis not present

## 2020-11-18 DIAGNOSIS — I8510 Secondary esophageal varices without bleeding: Secondary | ICD-10-CM | POA: Insufficient documentation

## 2020-11-18 DIAGNOSIS — K449 Diaphragmatic hernia without obstruction or gangrene: Secondary | ICD-10-CM | POA: Diagnosis not present

## 2020-11-18 DIAGNOSIS — Z6835 Body mass index (BMI) 35.0-35.9, adult: Secondary | ICD-10-CM | POA: Insufficient documentation

## 2020-11-18 DIAGNOSIS — Z1211 Encounter for screening for malignant neoplasm of colon: Secondary | ICD-10-CM | POA: Insufficient documentation

## 2020-11-18 DIAGNOSIS — Z7901 Long term (current) use of anticoagulants: Secondary | ICD-10-CM | POA: Insufficient documentation

## 2020-11-18 DIAGNOSIS — I130 Hypertensive heart and chronic kidney disease with heart failure and stage 1 through stage 4 chronic kidney disease, or unspecified chronic kidney disease: Secondary | ICD-10-CM | POA: Insufficient documentation

## 2020-11-18 HISTORY — PX: BIOPSY: SHX5522

## 2020-11-18 HISTORY — PX: POLYPECTOMY: SHX5525

## 2020-11-18 HISTORY — PX: COLONOSCOPY WITH PROPOFOL: SHX5780

## 2020-11-18 HISTORY — PX: ESOPHAGOGASTRODUODENOSCOPY (EGD) WITH PROPOFOL: SHX5813

## 2020-11-18 SURGERY — COLONOSCOPY WITH PROPOFOL
Anesthesia: Monitor Anesthesia Care

## 2020-11-18 MED ORDER — LACTATED RINGERS IV SOLN: INTRAVENOUS | Status: DC

## 2020-11-18 MED ORDER — LIDOCAINE 2% (20 MG/ML) 5 ML SYRINGE
INTRAMUSCULAR | Status: DC | PRN
  Administered 2020-11-18: 100 mg via INTRAVENOUS

## 2020-11-18 MED ORDER — SODIUM CHLORIDE 0.9 % IV SOLN: INTRAVENOUS | Status: DC

## 2020-11-18 MED ORDER — PROPOFOL 500 MG/50ML IV EMUL
INTRAVENOUS | Status: DC | PRN
  Administered 2020-11-18: 125 ug/kg/min via INTRAVENOUS

## 2020-11-18 MED ORDER — PROPOFOL 10 MG/ML IV BOLUS
INTRAVENOUS | Status: DC | PRN
  Administered 2020-11-18 (×2): 10 mg via INTRAVENOUS

## 2020-11-18 MED ORDER — PHENYLEPHRINE HCL (PRESSORS) 10 MG/ML IV SOLN
INTRAVENOUS | Status: DC | PRN
  Administered 2020-11-18 (×4): 120 ug via INTRAVENOUS

## 2020-11-18 SURGICAL SUPPLY — 24 items

## 2020-11-18 NOTE — Anesthesia Preprocedure Evaluation (Addendum)
Anesthesia Evaluation  Patient identified by MRN, date of birth, ID band Patient awake    Reviewed: Allergy & Precautions, H&P , NPO status , Patient's Chart, lab work & pertinent test results, reviewed documented beta blocker date and time   Airway Mallampati: II  TM Distance: >3 FB Neck ROM: Full    Dental no notable dental hx. (+) Teeth Intact, Dental Advisory Given   Pulmonary sleep apnea ,    Pulmonary exam normal breath sounds clear to auscultation       Cardiovascular hypertension, Pt. on medications and Pt. on home beta blockers + CAD, + Past MI, + CABG and +CHF  + Valvular Problems/Murmurs AS  Rhythm:Regular Rate:Normal     Neuro/Psych negative neurological ROS  negative psych ROS   GI/Hepatic negative GI ROS, Neg liver ROS,   Endo/Other  Morbid obesity  Renal/GU Renal disease  negative genitourinary   Musculoskeletal   Abdominal   Peds  Hematology negative hematology ROS (+)   Anesthesia Other Findings   Reproductive/Obstetrics negative OB ROS                            Anesthesia Physical Anesthesia Plan  ASA: 3  Anesthesia Plan: MAC   Post-op Pain Management:    Induction: Intravenous  PONV Risk Score and Plan: 1 and Propofol infusion  Airway Management Planned: Simple Face Mask  Additional Equipment:   Intra-op Plan:   Post-operative Plan:   Informed Consent: I have reviewed the patients History and Physical, chart, labs and discussed the procedure including the risks, benefits and alternatives for the proposed anesthesia with the patient or authorized representative who has indicated his/her understanding and acceptance.     Dental advisory given  Plan Discussed with: CRNA  Anesthesia Plan Comments:         Anesthesia Quick Evaluation

## 2020-11-18 NOTE — Discharge Instructions (Signed)
YOU HAD AN ENDOSCOPIC PROCEDURE TODAY: Refer to the procedure report and other information in the discharge instructions given to you for any specific questions about what was found during the examination. If this information does not answer your questions, please call [place] office at [phone] to clarify.  ° °YOU SHOULD EXPECT: Some feelings of bloating in the abdomen. Passage of more gas than usual. Walking can help get rid of the air that was put into your GI tract during the procedure and reduce the bloating. If you had a lower endoscopy (such as a colonoscopy or flexible sigmoidoscopy) you may notice spotting of blood in your stool or on the toilet paper. Some abdominal soreness may be present for a day or two, also. ° °DIET: Your first meal following the procedure should be a light meal and then it is ok to progress to your normal diet. A half-sandwich or bowl of soup is an example of a good first meal. Heavy or fried foods are harder to digest and may make you feel nauseous or bloated. Drink plenty of fluids but you should avoid alcoholic beverages for 24 hours. If you had a esophageal dilation, please see attached instructions for diet.   ° °ACTIVITY: Your care partner should take you home directly after the procedure. You should plan to take it easy, moving slowly for the rest of the day. You can resume normal activity the day after the procedure however YOU SHOULD NOT DRIVE, use power tools, machinery or perform tasks that involve climbing or major physical exertion for 24 hours (because of the sedation medicines used during the test).  ° °SYMPTOMS TO REPORT IMMEDIATELY: °A gastroenterologist can be reached at any hour. Please call [phone]  for any of the following symptoms:  °Following lower endoscopy (colonoscopy, flexible sigmoidoscopy) °Excessive amounts of blood in the stool  °Significant tenderness, worsening of abdominal pains  °Swelling of the abdomen that is new, acute  °Fever of 100° or  higher  °Following upper endoscopy (EGD, EUS, ERCP, esophageal dilation) °Vomiting of blood or coffee ground material  °New, significant abdominal pain  °New, significant chest pain or pain under the shoulder blades  °Painful or persistently difficult swallowing  °New shortness of breath  °Black, tarry-looking or red, bloody stools ° °FOLLOW UP:  °If any biopsies were taken you will be contacted by phone or by letter within the next 1-3 weeks. Call [phone]  if you have not heard about the biopsies in 3 weeks.  °Please also call with any specific questions about appointments or follow up tests. ° °

## 2020-11-18 NOTE — Anesthesia Postprocedure Evaluation (Signed)
Anesthesia Post Note  Patient: [name]  Procedure(s) Performed: COLONOSCOPY WITH PROPOFOL ESOPHAGOGASTRODUODENOSCOPY (EGD) WITH PROPOFOL BIOPSY POLYPECTOMY     Patient location during evaluation: Endoscopy Anesthesia Type: MAC Level of consciousness: awake and alert Pain management: pain level controlled Vital Signs Assessment: post-procedure vital signs reviewed and stable Respiratory status: spontaneous breathing, nonlabored ventilation and respiratory function stable Cardiovascular status: stable and blood pressure returned to baseline Postop Assessment: no apparent nausea or vomiting Anesthetic complications: no   No notable events documented.  Last Vitals:  Vitals:   11/18/20 1030 11/18/20 1033  BP: (!) 154/59 (!) 157/58  Pulse: 69 73  Resp: 17 18  Temp:    SpO2: 96% 95%    Last Pain:  Vitals:   11/18/20 1033  TempSrc:   PainSc: 0-No pain                 Deloria Brassfield,W. EDMOND

## 2020-11-18 NOTE — Op Note (Signed)
[facility] Patient Name: [name] Procedure Date: 11/18/2020 MRN: [id number] Attending MD: Carlota Raspberry. Havery Moros , MD Date of Birth: August 16, 1950 CSN: [id number] Age: 70 Admit Type: Outpatient Procedure:                Colonoscopy Indications:              High risk colon cancer surveillance: Personal                            history of colonic polyps (8/17 - 13 adenomas, 1/19                            - normal) Providers:                Carlota Raspberry. Havery Moros, MD, Lurline Del, RN, Jeanella Cara, RN, Frazier Richards, Technician Referring MD:              Medicines:                Monitored Anesthesia Care Complications:            No immediate complications. Estimated blood loss:                            Minimal. Estimated Blood Loss:     Estimated blood loss was minimal. Procedure:                Pre-Anesthesia Assessment:                           - Prior to the procedure, a History and Physical                            was performed, and patient medications and                            allergies were reviewed. The patient's tolerance of                            previous anesthesia was also reviewed. The risks                            and benefits of the procedure and the sedation                            options and risks were discussed with the patient.                            All questions were answered, and informed consent                            was obtained. Prior Anticoagulants: The patient has  taken Eliquis (apixaban), last dose was 2 days                            prior to procedure. ASA Grade Assessment: III - A                            patient with severe systemic disease. After                            reviewing the risks and benefits, the patient was                            deemed in satisfactory condition to undergo the                            procedure.                            After obtaining informed consent, the colonoscope                            was passed under direct vision. Throughout the                            procedure, the patient's blood pressure, pulse, and                            oxygen saturations were monitored continuously. The                            [id number]-HQ190L ([id number]) Olympus colonoscope was                            introduced through the anus and advanced to the the                            cecum, identified by appendiceal orifice and                            ileocecal valve. The colonoscopy was performed                            without difficulty. The patient tolerated the                            procedure well. The quality of the bowel                            preparation was good. The ileocecal valve,                            appendiceal orifice, and rectum were photographed. Scope In: 9:42:02 AM Scope Out: 10:03:38 AM Scope Withdrawal Time: 0 hours 15 minutes 13 seconds  Total Procedure Duration: 0 hours 21 minutes  36 seconds  Findings:      The perianal and digital rectal examinations were normal.      A diminutive polyp was found in the ileocecal valve. The polyp was       sessile. The polyp was removed with a cold snare. Resection and       retrieval were complete.      A 4 mm polyp was found in the hepatic flexure. The polyp was sessile.       The polyp was removed with a cold snare. Resection and retrieval were       complete.      A 3 mm polyp was found in the transverse colon. The polyp was sessile.       The polyp was removed with a cold snare. Resection and retrieval were       complete.      Scattered medium-mouthed diverticula were found in the entire colon.      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      Internal hemorrhoids were found during retroflexion.      The exam was otherwise without abnormality. Impression:               - One diminutive polyp at the ileocecal valve,                             removed with a cold snare. Resected and retrieved.                           - One 4 mm polyp at the hepatic flexure, removed                            with a cold snare. Resected and retrieved.                           - One 3 mm polyp in the transverse colon, removed                            with a cold snare. Resected and retrieved.                           - Diverticulosis in the entire examined colon.                           - Rectal varices.                           - Internal hemorrhoids.                           - The examination was otherwise normal. Moderate Sedation:      No moderate sedation, case performed with MAC Recommendation:           - Patient has a contact number available for                            emergencies. The signs and symptoms of potential  delayed complications were discussed with the                            patient. Return to normal activities tomorrow.                            Written discharge instructions were provided to the                            patient.                           - Resume previous diet.                           - Continue present medications.                           - Resume Eliquis tomorrow                           - Await pathology results. Procedure Code(s):        --- Professional ---                           [phone], Colonoscopy, flexible; with removal of                            tumor(s), polyp(s), or other lesion(s) by snare                            technique Diagnosis Code(s):        --- Professional ---                           K64.8, Other hemorrhoids                           Z86.010, Personal history of colonic polyps                           K63.5, Polyp of colon                           K57.30, Diverticulosis of large intestine without                            perforation or abscess without bleeding CPT copyright 2019 American Medical  Association. All rights reserved. The codes documented in this report are preliminary and upon coder review may  be revised to meet current compliance requirements. Remo Lipps P. Miyo Aina, MD 11/18/2020 10:12:47 AM This report has been signed electronically. Number of Addenda: 0

## 2020-11-18 NOTE — Telephone Encounter (Signed)
Okay to refer? 

## 2020-11-18 NOTE — Op Note (Signed)
[facility] Patient Name: [name] Procedure Date: 11/18/2020 MRN: [id number] Attending MD: Carlota Raspberry. Havery Moros , MD Date of Birth: [birth year] CSN: [id number] Age: 70 Admit Type: Outpatient Procedure:                Upper GI endoscopy Indications:              Cirrhosis rule out esophageal varices Providers:                Remo Lipps P. Havery Moros, MD, Lurline Del, RN, Jeanella Cara, RN, Frazier Richards, Technician Referring MD:              Medicines:                Monitored Anesthesia Care Complications:            No immediate complications. Estimated blood loss:                            Minimal. Estimated Blood Loss:     Estimated blood loss was minimal. Procedure:                Pre-Anesthesia Assessment:                           - Prior to the procedure, a History and Physical                            was performed, and patient medications and                            allergies were reviewed. The patient's tolerance of                            previous anesthesia was also reviewed. The risks                            and benefits of the procedure and the sedation                            options and risks were discussed with the patient.                            All questions were answered, and informed consent                            was obtained. Prior Anticoagulants: The patient has                            taken Eliquis (apixaban), last dose was 2 days                            prior to procedure. ASA Grade Assessment: III - A  patient with severe systemic disease. After                            reviewing the risks and benefits, the patient was                            deemed in satisfactory condition to undergo the                            procedure.                           After obtaining informed consent, the endoscope was                            passed under  direct vision. Throughout the                            procedure, the patient's blood pressure, pulse, and                            oxygen saturations were monitored continuously. The                            [id number]-H190 ([id number]) Olympus endoscope was introduced                            through the mouth, and advanced to the second part                            of duodenum. The upper GI endoscopy was                            accomplished without difficulty. The patient                            tolerated the procedure well. Scope In: Scope Out: Findings:      Esophagogastric landmarks were identified: the Z-line was found at 40       cm, the gastroesophageal junction was found at 40 cm and the upper       extent of the gastric folds was found at 41 cm from the incisors.      A 1 cm hiatal hernia was present.      Varices were found in the lower third of the esophagus. One column of       moderate varix, other columns were small which flattened with       insufflation. No stigmata for bleeding noted.      The exam of the esophagus was otherwise normal.      A few small erosions with no stigmata of recent bleeding were found in       the gastric antrum.      The exam of the stomach was otherwise normal. No gastric varices.      Biopsies were taken with a cold forceps in the gastric body, at the       incisura and in  the gastric antrum for Helicobacter pylori testing.      A few erosions were found in the duodenal bulb.      The exam of the duodenum was otherwise normal. Impression:               - Esophagogastric landmarks identified.                           - 1 cm hiatal hernia.                           - One moderate varix, other small columns which                            flattened.                           - Normal esophagus otherwise                           - Erosive gastropathy with no stigmata of recent                            bleeding.                            - Normal stomach otherwise. Biopsies taken to rule                            out H pylori                           - Duodenal erosions.                           - Normal duodenum otherwise. Moderate Sedation:      No moderate sedation, case performed with MAC Recommendation:           - Patient has a contact number available for                            emergencies. The signs and symptoms of potential                            delayed complications were discussed with the                            patient. Return to normal activities tomorrow.                            Written discharge instructions were provided to the                            patient.                           - Resume previous diet.                           -  Continue present medications.                           - Will speak with your cardiologist about the                            possibility of switching Metoprolol to either                            Nadolol or Coreg to cover / treat varices                           - Avoid NSAIDs                           - Await pathology results. Procedure Code(s):        --- Professional ---                           [id number], Esophagogastroduodenoscopy, flexible,                            transoral; with biopsy, single or multiple Diagnosis Code(s):        --- Professional ---                           K74.60, Unspecified cirrhosis of liver                           I85.10, Secondary esophageal varices without                            bleeding                           K44.9, Diaphragmatic hernia without obstruction or                            gangrene                           K31.89, Other diseases of stomach and duodenum                           K26.9, Duodenal ulcer, unspecified as acute or                            chronic, without hemorrhage or perforation CPT copyright 2019 American Medical Association. All rights reserved. The codes  documented in this report are preliminary and upon coder review may  be revised to meet current compliance requirements. Remo Lipps P. Govani Radloff, MD 11/18/2020 10:19:57 AM This report has been signed electronically. Number of Addenda: 0

## 2020-11-18 NOTE — H&P (Signed)
[facility] History and Physical   Primary Care Physician:  Isaac Bliss, Rayford Halsted, MD   Reason for Procedure:   History of cirrhosis - screen for varices, history of colon polyps  Plan:    EGD and colonoscopy     HPI: [name] is a 70 y.o. male  here for EGD and colonoscopy. Relatively recent diagnosis of cirrhosis, here for EGD to screen for esophageal varices. History of 13 adenomas in 2017, follow up colonoscopy one year later without any polyps. Here for surveillance colonoscopy today. Denies any bowel symptoms at this time. No family history of colon cancer known. Otherwise feels well without any cardiopulmonary symptoms. Has moderate to severe AS, cleared by cardiology for anesthesia. On Eliquis, has been held for 2 days.   I have discussed risks / benefits of endoscopy and anesthesia with him, he wishes to proceed, all questions answered.   Past Medical History:  Diagnosis Date   Adjustment disorder with depressed mood 09/07/2007   Cataract    removed bilaterally   Chronic kidney disease    kidney stones   Cirrhosis ([place])    Colon polyps    Tubular Adenoma 2010   CONGESTIVE HEART FAILURE 12/09/2008   CORONARY ARTERY DISEASE 2006   HYPERLIPIDEMIA 08/09/2006   HYPERTENSION 08/09/2006   MYOCARDIAL INFARCTION, HX OF 07/07/2004   NEPHROLITHIASIS, HX OF 08/09/2006   OBESITY 07/19/2008   Sleep apnea    has a cpap - not using religiously    Past Surgical History:  Procedure Laterality Date   CARDIOVERSION N/A 12/14/2017   Procedure: CARDIOVERSION;  Surgeon: [name], MD;  Location: [facility];  Service: Cardiovascular;  Laterality: N/A;   CARDIOVERSION N/A 10/01/2019   Procedure: CARDIOVERSION;  Surgeon: Buford Dresser, MD;  Location: [facility];  Service: Cardiovascular;  Laterality: N/A;   COLONOSCOPY     CORONARY ARTERY BYPASS GRAFT  2006   POLYPECTOMY      Prior to Admission medications   Medication Sig Start Date  End Date Taking? Authorizing Provider  acetaminophen (TYLENOL) 500 MG tablet Take 1,000 mg by mouth daily as needed for moderate pain or headache.   Yes [provider]  apixaban (ELIQUIS) 5 MG TABS tablet TAKE 1 TABLET BY MOUTH TWICE A DAY 08/05/20  Yes Sarajane Jews, Callie E, PA-C  atorvastatin (LIPITOR) 80 MG tablet TAKE 1 TABLET BY MOUTH EVERY DAY 07/10/20  Yes Isaac Bliss, Rayford Halsted, MD  furosemide (LASIX) 20 MG tablet TAKE 3 TABLETS (60MG ) IN THE MORNING AND 2 TABLETS (40MG ) IN THE EVENING. 11/03/20  Yes Lelon Perla, MD  hydrocortisone 2.5 % cream Apply 1 application topically daily as needed (irritation). 12/03/19  Yes [provider]  KLOR-CON M20 20 MEQ tablet TAKE 1 TABLET BY MOUTH EVERY DAY 12/18/19  Yes Crenshaw, Denice Bors, MD  metoprolol succinate (TOPROL-XL) 25 MG 24 hr tablet TAKE 1 TABLET BY MOUTH EVERY DAY WITH OR IMMEDIATELY FOLLOWING A MEAL 11/06/20  Yes Lelon Perla, MD  tamsulosin (FLOMAX) 0.4 MG CAPS capsule TAKE ONE CAPSULE BY MOUTH EVERY DAY AFTER SUPPER 07/10/20  Yes Isaac Bliss, Rayford Halsted, MD  vitamin B-12 (CYANOCOBALAMIN) 1000 MCG tablet Take 1 tablet (1,000 mcg total) by mouth daily. 07/14/20  Yes Orson Slick, MD    Current Facility-Administered Medications  Medication Dose Route Frequency Provider Last Rate Last Admin   0.9 %  sodium chloride infusion   Intravenous Continuous Janah Mcculloh, Carlota Raspberry, MD       lactated ringers  infusion   Intravenous Continuous Reena Borromeo, Carlota Raspberry, MD        Allergies as of 09/30/2020   (No Known Allergies)    Family History  Problem Relation Age of Onset   Hypertension Mother    Hyperlipidemia Mother    Heart disease Father    Ulcers Father    Bipolar disorder Brother    Colon cancer Neg Hx    Colon polyps Neg Hx    Esophageal cancer Neg Hx    Rectal cancer Neg Hx    Stomach cancer Neg Hx     Social History   Socioeconomic History   Marital status: Single    Spouse name: Not on file    Number of children: 0   Years of education: Not on file   Highest education level: Not on file  Occupational History   Occupation: Passenger transport manager  Tobacco Use   Smoking status: Never   Smokeless tobacco: Never  Vaping Use   Vaping Use: Never used  Substance and Sexual Activity   Alcohol use: Yes    Alcohol/week: 1.0 standard drink    Types: 1 Cans of beer per week    Comment: occasional   Drug use: No   Sexual activity: Not on file  Other Topics Concern   Not on file  Social History Narrative   Not on file   Social Determinants of Health   Financial Resource Strain: Not on file  Food Insecurity: Not on file  Transportation Needs: Not on file  Physical Activity: Not on file  Stress: Not on file  Social Connections: Not on file  Intimate Partner Violence: Not on file    Review of Systems: All other review of systems negative except as mentioned in the HPI.  Lab Results  Component Value Date   WBC 4.4 10/16/2020   HGB 12.8 (L) 10/16/2020   HCT 37.0 (L) 10/16/2020   MCV 91.4 10/16/2020   PLT 82 (L) 10/16/2020    Lab Results  Component Value Date   INR 1.7 (H) 09/24/2020    Lab Results  Component Value Date   ALT 20 10/16/2020   AST 34 10/16/2020   ALKPHOS 70 10/16/2020   BILITOT 2.1 (H) 10/16/2020   Lab Results  Component Value Date   CREATININE 0.82 10/16/2020   BUN 13 10/16/2020   NA 139 10/16/2020   K 4.6 10/16/2020   CL 106 10/16/2020   CO2 25 10/16/2020      Physical Exam: Vital signs BP (!) 138/41   Pulse 70   Temp 97.8 F (36.6 C) (Axillary)   Resp 19   Ht 5\' 6"  (1.676 m)   Wt 99.8 kg   SpO2 96%   BMI 35.51 kg/m   General:   Alert,  Well-developed, pleasant and cooperative in NAD Lungs:  Clear throughout to auscultation.   Heart:  Regular rate and rhythm Abdomen:  Soft, nontender and nondistended.   Neuro/Psych:  Alert and cooperative. Normal mood and affect. A and O x 3  Jolly Mango, MD [facility]  Gastroenterology

## 2020-11-18 NOTE — Transfer of Care (Signed)
Immediate Anesthesia Transfer of Care Note  Patient: [name]  Procedure(s) Performed: COLONOSCOPY WITH PROPOFOL ESOPHAGOGASTRODUODENOSCOPY (EGD) WITH PROPOFOL BIOPSY POLYPECTOMY  Patient Location: PACU and Endoscopy Unit  Anesthesia Type:MAC  Level of Consciousness: awake, alert  and oriented  Airway & Oxygen Therapy: Patient Spontanous Breathing and Patient connected to face mask  Post-op Assessment: Report given to RN and Post -op Vital signs reviewed and stable  Post vital signs: Reviewed,stable  Last Vitals:  Vitals Value Taken Time  BP 134/54 11/18/20 1010  Temp    Pulse 66 11/18/20 1012  Resp 23 11/18/20 1012  SpO2 98 % 11/18/20 1012  Vitals shown include unvalidated device data.  Last Pain:  Vitals:   11/18/20 0826  TempSrc: Axillary  PainSc: 0-No pain         Complications: No notable events documented.

## 2020-11-19 NOTE — Telephone Encounter (Signed)
Referral updated and patient is aware.

## 2020-11-19 NOTE — Addendum Note (Signed)
Addended by: Westley Hummer B on: 11/19/2020 02:42 PM   Modules accepted: Orders

## 2020-11-20 LAB — SURGICAL PATHOLOGY

## 2020-11-21 ENCOUNTER — Other Ambulatory Visit: Payer: Self-pay

## 2020-11-21 ENCOUNTER — Encounter: Payer: Self-pay | Admitting: Physical Therapy

## 2020-11-21 ENCOUNTER — Ambulatory Visit: Payer: 59 | Attending: Internal Medicine | Admitting: Physical Therapy

## 2020-11-21 ENCOUNTER — Telehealth: Payer: Self-pay | Admitting: Gastroenterology

## 2020-11-21 DIAGNOSIS — R42 Dizziness and giddiness: Secondary | ICD-10-CM | POA: Insufficient documentation

## 2020-11-21 DIAGNOSIS — R278 Other lack of coordination: Secondary | ICD-10-CM | POA: Diagnosis present

## 2020-11-21 DIAGNOSIS — R2681 Unsteadiness on feet: Secondary | ICD-10-CM | POA: Diagnosis present

## 2020-11-21 NOTE — Patient Instructions (Signed)
Access Code: [email or web address] URL: [email or web address] Date: 11/21/2020 Prepared by: Grayling Congress  Exercises Narrow Stance with Counter Support - 1 x daily - 5 x weekly - 3 sets - 30 sec hold Standing Toe Taps - 1 x daily - 5 x weekly - 2 sets - 10 reps Standing Tandem Balance with Counter Support - 1 x daily - 5 x weekly - 3 sets - 10 reps

## 2020-11-21 NOTE — Telephone Encounter (Signed)
I reached out to Dr. Stanford Breed of cardiology about this case.  He is okay if we switch his metoprolol to Coreg.  In this light I would like to stop the metoprolol and add Coreg 3.125 mg twice daily.  [place] can you please let the patient know to make the medication switch for him.  If he does not tolerate this or has side effects from it please let me know.  Thanks

## 2020-11-21 NOTE — Therapy (Addendum)
[facility] [address] [place], [place], [address] Phone: [phone]   Fax:  [phone]  Physical Therapy Evaluation  Patient Details  Name: [name] MRN: [id number] Date of Birth: [birth year] Referring Provider (PT): Isaac Bliss, Rayford Halsted, MD   Encounter Date: 11/21/2020   PT End of Session - 11/21/20 1037     Visit Number 1    Number of Visits 17    Date for PT Re-Evaluation 01/16/21    Authorization Type UHC    PT Start Time 0926    PT Stop Time 1018    PT Time Calculation (min) 52 min    Activity Tolerance Patient tolerated treatment well    Behavior During Therapy [facility] for tasks assessed/performed             Past Medical History:  Diagnosis Date   Adjustment disorder with depressed mood 09/07/2007   Cataract    removed bilaterally   Chronic kidney disease    kidney stones   Cirrhosis ([place])    Colon polyps    Tubular Adenoma 2010   CONGESTIVE HEART FAILURE 12/09/2008   CORONARY ARTERY DISEASE 2006   HYPERLIPIDEMIA 08/09/2006   HYPERTENSION 08/09/2006   MYOCARDIAL INFARCTION, HX OF 07/07/2004   NEPHROLITHIASIS, HX OF 08/09/2006   OBESITY 07/19/2008   Sleep apnea    has a cpap - not using religiously    Past Surgical History:  Procedure Laterality Date   BIOPSY  11/18/2020   Procedure: BIOPSY;  Surgeon: Yetta Flock, MD;  Location: [facility];  Service: Gastroenterology;;   CARDIOVERSION N/A 12/14/2017   Procedure: CARDIOVERSION;  Surgeon: [name], MD;  Location: [place];  Service: Cardiovascular;  Laterality: N/A;   CARDIOVERSION N/A 10/01/2019   Procedure: CARDIOVERSION;  Surgeon: Buford Dresser, MD;  Location: [place];  Service: Cardiovascular;  Laterality: N/A;   COLONOSCOPY     COLONOSCOPY WITH PROPOFOL N/A 11/18/2020   Procedure: COLONOSCOPY WITH PROPOFOL;  Surgeon: Yetta Flock, MD;  Location: [facility];  Service: Gastroenterology;   Laterality: N/A;   CORONARY ARTERY BYPASS GRAFT  2006   ESOPHAGOGASTRODUODENOSCOPY (EGD) WITH PROPOFOL N/A 11/18/2020   Procedure: ESOPHAGOGASTRODUODENOSCOPY (EGD) WITH PROPOFOL;  Surgeon: Yetta Flock, MD;  [facility];  Service: Gastroenterology;  Laterality: N/A;   POLYPECTOMY     POLYPECTOMY  11/18/2020   Procedure: POLYPECTOMY;  Surgeon: Yetta Flock, MD;  [facility];  Service: Gastroenterology;;    There were no vitals filed for this visit.    Subjective Assessment - 11/21/20 0928     Subjective Patient reports trouble with dizziness and imbalance for the past couple of years. Cardiologist did not think it was heart related. Heard something on TV about crystals in his ears and thinks it might be that. Episodes last seconds and describes it as "equilibrium is off, unsteady." Worse when walking across an open parking lot, working at his desk, driving on bridges, sometimes worse with transfers and head turns. Denies nausea, head trauma, infection, hearing loss, tinnitus, otalgia, otorrhea. Uses a rolling briefcase at work to help with stability. Does not usually use his SPC but using it today.    Pertinent History CHF, CKD, CAD, HLD, HTN, CABG s/p MI 2006    Limitations Sitting;Reading;Lifting;Standing;Walking;House hold activities    Diagnostic tests none recent    Patient Stated Goals improve dizziness    Currently in Pain? No/denies  [facility] PT Assessment - 11/21/20 0935       Assessment   Medical Diagnosis Vertigo, Unsteady gait    Referring Provider (PT) Isaac Bliss, Rayford Halsted, MD    Onset Date/Surgical Date 11/22/18    Next MD Visit 02/13/2020    Prior Therapy cardiac rehab s/p MI      Balance Screen   Has the patient fallen in the past 6 months No    Has the patient had a decrease in activity level because of a fear of falling?  Yes    Is the patient reluctant to leave their home because of a fear of falling?  No       Home Environment   Living Environment Private residence    Living Arrangements Alone   has a SO staying with him   Type of [place] Access Level entry    Home Layout Laundry or work area in basement   handrails on 1 side   Home Equipment [place] - single point;Crutches;Walker - 2 wheels;Shower seat      Prior Function   Level of Independence Independent    Vocation Full time employment    Press photographer work    Leisure golf, yard work      Charity fundraiser Status Within [employer] for tasks assessed      Occupational psychologist Movements are Fluid and Coordinated No   slow   Coordination and Movement Description B pronation/supination slow    Finger Nose Finger Test moderate intention tremor and dysmetric evident L>R UE      Posture/Postural Control   Posture/Postural Control Postural limitations    Postural Limitations Rounded Shoulders;Increased thoracic kyphosis      Ambulation/Gait   Assistive device Straight cane    Gait Pattern Decreased step length - right;Decreased step length - left;Step-to pattern;Step-through pattern;Trunk flexed   tendency to walk along edge of room d/t fear of falling   Ambulation Surface Level;Indoor    Gait velocity decreased                    Vestibular Assessment - 11/21/20 0940       Oculomotor Exam   Oculomotor Alignment Normal    Ocular ROM intact    Spontaneous Absent    Gaze-induced  Absent    Smooth Pursuits Intact    Saccades Undershoots   worse to L;   Comment convergence intact until ~3 inch      Oculomotor Exam-Fixation Suppressed    Left Head Impulse intact    Right Head Impulse intact      Vestibulo-Ocular Reflex   VOR 1 Head Only (x 1 viewing) intact but slow vertical and horizontal    VOR Cancellation Corrective saccades   worse to L     Positional Testing   Dix-Hallpike Dix-Hallpike Right;Dix-Hallpike  Left    Sidelying Test Sidelying Right;Sidelying Left    Horizontal Canal Testing Horizontal Canal Right;Horizontal Canal Left      Dix-Hallpike Right   Dix-Hallpike Right Duration 0    Dix-Hallpike Right Symptoms No nystagmus      Dix-Hallpike Left   Dix-Hallpike Left Duration 0    Dix-Hallpike Left Symptoms No nystagmus      Sidelying Right   Sidelying Right Duration 15 sec    Sidelying Right Symptoms No nystagmus   c/o mild dizziness upon laying  down and sitting up     Sidelying Left   Sidelying Left Duration 0    Sidelying Left Symptoms No nystagmus      Horizontal Canal Right   Horizontal Canal Right Duration 0    Horizontal Canal Right Symptoms Normal      Horizontal Canal Left   Horizontal Canal Left Duration 0    Horizontal Canal Left Symptoms Normal                Objective measurements completed on examination: See above findings.                PT Education - 11/21/20 1034     Education Details prognosis, POC, HEP- to be completed at counter top for safety, Access Code: [email or web address]; edu on exam findings and unexpected findings of intention tremor and dysmetria    Person(s) Educated Patient    Methods Explanation;Demonstration;Tactile cues;Verbal cues;Handout    Comprehension Verbalized understanding;Returned demonstration              PT Short Term Goals - 11/21/20 1058       PT SHORT TERM GOAL #1   Title Patient to be independent with initial HEP.    Time 3    Period Weeks    Status New    Target Date 12/12/20               PT Long Term Goals - 11/21/20 1059       PT LONG TERM GOAL #1   Title Patient to be independent with advanced HEP.    Time 8    Period Weeks    Status New    Target Date 01/16/21      PT LONG TERM GOAL #2   Title Patient to score at least 20/24 on DGI in order to decrease risk of falls.    Time 8    Period Weeks    Status New    Target Date 01/16/21      PT LONG TERM GOAL #3   Title Patient  to report 70% improvement in balance confidence with ambulation with/without LRAD.    Time 8    Period Weeks    Status New    Target Date 01/16/21      PT LONG TERM GOAL #4   Title Patient to report 70% improvement in dizziness.    Time 8    Period Weeks    Status New    Target Date 01/16/21      PT LONG TERM GOAL #5   Title Pt will ambulate over outdoor surfaces with LRAD while performing head turns to scan environment with good stability in order to indicate safe community mobility.    Time 8    Period Weeks    Status New    Target Date 01/16/21                    Plan - 11/21/20 1055     Clinical Impression Statement Patient is a 70 y/o M presenting to OPPT with c/o dizziness and imbalance for the past couple years. Episodes last seconds and worse when walking across an open parking lot, working at his desk, driving on bridges, sometimes worse with transfers and head turns. Denies nausea, head trauma, infection, hearing loss, tinnitus, otalgia, otorrhea. Reports infrequent use of SPC but does admit to using rolling briefcase at work for stability. Patient today presenting with rounded and kyphotic posture, decreased speed and dysmetria with  B UE coordination testing, L>R UE intention tremor, and gait deviations. Oculomotor testing revealed undershooting with L saccadic testing and corrective saccades with L VOR cancellation. Positional testing was negative for nystagmus; mild c/o dizziness reported with R sidelying test. Made PCP aware of patient's unexpected exam findings including dysmetria and intention tremor. Patient was educated on gentle balance HEP to be performed at counter top for safety- patient reported understanding. Would benefit from skilled PT services 1-2x/week for 8 weeks to address aforementioned impairments in order to optimize level of function.    Personal Factors and Comorbidities Age;Fitness;Comorbidity 3+;Time since onset of  injury/illness/exacerbation;Past/Current Experience    Comorbidities CHF, CKD, CAD, HLD, HTN, CABG s/p MI 2006    Examination-Activity Limitations Bathing;Locomotion Level;Transfers;Reach Overhead;Bed Mobility;Bend;Sit;Carry;Squat;Stairs;Dressing;Stand;Hygiene/Grooming;Lift;Toileting    Examination-Participation Restrictions [employer];Shop;Driving;Community Activity;Cleaning;Occupation;Church;Meal Prep    Stability/Clinical Decision Making Unstable/Unpredictable    Clinical Decision Making High    Rehab Potential Good    PT Frequency --   1-2x   PT Duration 8 weeks    PT Treatment/Interventions ADLs/Self Care Home Management;Canalith Repostioning;Cryotherapy;Moist Heat;DME Instruction;Neuromuscular re-education;Balance training;Therapeutic exercise;Therapeutic activities;Functional mobility training;Stair training;Gait training;Patient/family education;Manual techniques;Energy conservation;Dry needling;Passive range of motion;Taping;Vestibular;Visual/perceptual remediation/compensation    PT Next Visit Plan reassess HEP; assess DGI, assess orthostatics, progress balance training    Consulted and Agree with Plan of Care Patient             Patient will benefit from skilled therapeutic intervention in order to improve the following deficits and impairments:  Abnormal gait, Decreased coordination, Difficulty walking, Dizziness, Decreased balance, Impaired flexibility, Improper body mechanics, Postural dysfunction, Decreased strength  Visit Diagnosis: Dizziness and giddiness - Plan: PT plan of care cert/re-cert  Unsteadiness on feet - Plan: PT plan of care cert/re-cert  Other lack of coordination - Plan: PT plan of care cert/re-cert     Problem List Patient Active Problem List   Diagnosis Date Noted   Benign neoplasm of colon    Hepatic cirrhosis ([place])    Gastritis and gastroduodenitis    Esophageal varices without bleeding (HCC)    Atrial flutter ([facility])    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[facility])    Aortic stenosis 10/11/2017   Murmur 06/26/2015   History of colon polyps 09/20/2013   Impaired glucose tolerance 09/20/2013   Exogenous obesity 09/20/2013   Thrombocytopenia, unspecified ([place]) 09/20/2013   Bruit 08/28/2013   Chronic combined systolic and diastolic heart failure ([place]) 12/09/2008   DYSPNEA 12/09/2008   OBESITY 07/19/2008   WEIGHT GAIN 07/15/2008   ADJUSTMENT DISORDER WITH DEPRESSED MOOD 09/07/2007   CELLULITIS, RIGHT LEG 07/04/2007   Dyslipidemia 08/09/2006   Essential hypertension 08/09/2006   MYOCARDIAL INFARCTION, HX OF 08/09/2006   Coronary atherosclerosis 08/09/2006   NEPHROLITHIASIS, HX OF 08/09/2006    Janene Harvey, PT, DPT 11/21/20 11:48 AM   [facility] [address] [place], [place], [address] Phone: [phone]   Fax:  [phone]  Name: [name] MRN: [id number] Date of Birth: 1950-06-28

## 2020-11-22 ENCOUNTER — Other Ambulatory Visit: Payer: Self-pay | Admitting: Internal Medicine

## 2020-11-22 DIAGNOSIS — R42 Dizziness and giddiness: Secondary | ICD-10-CM

## 2020-11-24 ENCOUNTER — Encounter: Payer: Self-pay | Admitting: Neurology

## 2020-11-24 MED ORDER — CARVEDILOL 3.125 MG PO TABS: 3.1250 mg | ORAL_TABLET | Freq: Two times a day (BID) | ORAL | 2 refills | Status: DC

## 2020-11-24 NOTE — Addendum Note (Signed)
Addended by: Yevette Edwards on: 11/24/2020 10:12 AM   Modules accepted: Orders

## 2020-11-24 NOTE — Telephone Encounter (Addendum)
Spoke with patient in regards to change in medications/recommendations. Pt would like RX sent to [facility] in [place]. Pt verbalized understanding and had no concerns at the end of the call.

## 2020-11-25 ENCOUNTER — Other Ambulatory Visit: Payer: Self-pay

## 2020-11-25 ENCOUNTER — Encounter: Payer: Self-pay | Admitting: Physical Therapy

## 2020-11-25 ENCOUNTER — Ambulatory Visit: Payer: 59 | Admitting: Physical Therapy

## 2020-11-25 DIAGNOSIS — R42 Dizziness and giddiness: Secondary | ICD-10-CM

## 2020-11-25 DIAGNOSIS — R278 Other lack of coordination: Secondary | ICD-10-CM

## 2020-11-25 DIAGNOSIS — R2681 Unsteadiness on feet: Secondary | ICD-10-CM

## 2020-11-25 NOTE — Therapy (Signed)
[facility] [address] [place], [place], [address] Phone: [phone]   Fax:  [phone]  Physical Therapy Treatment  Patient Details  Name: [name] MRN: [id number] Date of Birth: 1950-07-29 Referring Provider (PT): Isaac Bliss, Rayford Halsted, MD   Encounter Date: 11/25/2020   PT End of Session - 11/25/20 1540     Visit Number 2    Number of Visits 17    Date for PT Re-Evaluation 01/16/21    Authorization Type UHC    PT Start Time 1448    PT Stop Time 2725    PT Time Calculation (min) 42 min    Equipment Utilized During Treatment Gait belt    Activity Tolerance Patient tolerated treatment well    Behavior During Therapy [facility] for tasks assessed/performed             Past Medical History:  Diagnosis Date   Adjustment disorder with depressed mood 09/07/2007   Cataract    removed bilaterally   Chronic kidney disease    kidney stones   Cirrhosis ([place])    Colon polyps    Tubular Adenoma 2010   CONGESTIVE HEART FAILURE 12/09/2008   CORONARY ARTERY DISEASE 2006   HYPERLIPIDEMIA 08/09/2006   HYPERTENSION 08/09/2006   MYOCARDIAL INFARCTION, HX OF 07/07/2004   NEPHROLITHIASIS, HX OF 08/09/2006   OBESITY 07/19/2008   Sleep apnea    has a cpap - not using religiously    Past Surgical History:  Procedure Laterality Date   BIOPSY  11/18/2020   Procedure: BIOPSY;  Surgeon: Yetta Flock, MD;  Location: [facility];  Service: Gastroenterology;;   CARDIOVERSION N/A 12/14/2017   Procedure: CARDIOVERSION;  Surgeon: [name], MD;  Location: [place];  Service: Cardiovascular;  Laterality: N/A;   CARDIOVERSION N/A 10/01/2019   Procedure: CARDIOVERSION;  Surgeon: Buford Dresser, MD;  Location: [place];  Service: Cardiovascular;  Laterality: N/A;   COLONOSCOPY     COLONOSCOPY WITH PROPOFOL N/A 11/18/2020   Procedure: COLONOSCOPY WITH PROPOFOL;  Surgeon: Yetta Flock, MD;   [facility];  Service: Gastroenterology;  Laterality: N/A;   CORONARY ARTERY BYPASS GRAFT  2006   ESOPHAGOGASTRODUODENOSCOPY (EGD) WITH PROPOFOL N/A 11/18/2020   Procedure: ESOPHAGOGASTRODUODENOSCOPY (EGD) WITH PROPOFOL;  Surgeon: Yetta Flock, MD;  [facility];  Service: Gastroenterology;  Laterality: N/A;   POLYPECTOMY     POLYPECTOMY  11/18/2020   Procedure: POLYPECTOMY;  Surgeon: Yetta Flock, MD;  [facility];  Service: Gastroenterology;;    There were no vitals filed for this visit.   Subjective Assessment - 11/25/20 1449     Subjective Neurologist office called to set up an appointment for January 24th. MD ordered MRI. Feels like balance is getting better with HEP.    Pertinent History CHF, CKD, CAD, HLD, HTN, CABG s/p MI 2006    Diagnostic tests none recent    Patient Stated Goals improve dizziness    Currently in Pain? No/denies             Orthostatic VS for the past 72 hrs (Last 3 readings):  Orthostatic BP Patient Position BP Location Orthostatic Pulse  11/25/20 1458 139/70 Standing Right Arm 69  11/25/20 1457 140/71 Sitting Right Arm --  11/25/20 1453 141/75 Supine Right Arm 68      [facility] PT Assessment - 11/25/20 0001       Balance   Balance Assessed Yes      Standardized Balance Assessment  Standardized Balance Assessment Dynamic Gait Index      Dynamic Gait Index   Level Surface Mild Impairment    Change in Gait Speed Mild Impairment    Gait with Horizontal Head Turns Mild Impairment    Gait with Vertical Head Turns Normal    Gait and Pivot Turn Normal    Step Over Obstacle Normal    Step Around Obstacles Normal    Steps Mild Impairment   per patient's report   Total Score 20                           [facility] Adult PT Treatment/Exercise - 11/25/20 0001       Neuro Re-ed    Neuro Re-ed Details  alt tone tap on cone 2x20, double toe tap 10x; romberg EO/EC 2x30", R/L tandem 30"   in II  bars; no UE support     Exercises   Exercises Knee/Hip      Knee/Hip Exercises: Seated   Sit to [employer] --                     PT Education - 11/25/20 1531     Education Details update to HEP;Access Code: [email or web address]    Person(s) Educated Patient    Methods Explanation;Demonstration;Tactile cues;Verbal cues;Handout    Comprehension Verbalized understanding;Returned demonstration              PT Short Term Goals - 11/25/20 1632       PT SHORT TERM GOAL #1   Title Patient to be independent with initial HEP.    Time 3    Period Weeks    Status On-going    Target Date 12/12/20               PT Long Term Goals - 11/25/20 1632       PT LONG TERM GOAL #1   Title Patient to be independent with advanced HEP.    Time 8    Period Weeks    Status On-going      PT LONG TERM GOAL #2   Title Patient to score at least 20/24 on DGI in order to decrease risk of falls.    Time 8    Period Weeks    Status On-going      PT LONG TERM GOAL #3   Title Patient to report 70% improvement in balance confidence with ambulation with/without LRAD.    Time 8    Period Weeks    Status On-going      PT LONG TERM GOAL #4   Title Patient to report 70% improvement in dizziness.    Time 8    Period Weeks    Status On-going      PT LONG TERM GOAL #5   Title Pt will ambulate over outdoor surfaces with LRAD while performing head turns to scan environment with good stability in order to indicate safe community mobility.    Time 8    Period Weeks    Status On-going                   Plan - 11/25/20 1540     Clinical Impression Statement Patient arrived to session with report that he was set up with Neurologist appointment in January. Brain MRI ordered but not yet scheduled. Reports compliance with HEP and improving stability. Patient's score on DGI does not indicates an increased risk of falls and  he did not report dizziness during this test but reports that he relied  heavily on CGA d/t frequent fear of falling. Some instability was noted with R/L head turns with gait and limited change in gait speed evident when prompted. Orthostatic testing was negative. Patient did however report mild dizziness upon supine>sit transition. Reviewed HEP and provided cues to increase alternating weight shift and discourage UE use when not needed during dynamic balance exercises. Fairly good stability demonstrated with static balance today. Updated HEP to increase challenge- patient reported understanding and without complaints at end of session.    Comorbidities CHF, CKD, CAD, HLD, HTN, CABG s/p MI 2006    PT Treatment/Interventions ADLs/Self Care Home Management;Canalith Repostioning;Cryotherapy;Moist Heat;DME Instruction;Neuromuscular re-education;Balance training;Therapeutic exercise;Therapeutic activities;Functional mobility training;Stair training;Gait training;Patient/family education;Manual techniques;Energy conservation;Dry needling;Passive range of motion;Taping;Vestibular;Visual/perceptual remediation/compensation    PT Next Visit Plan progress balance training    Consulted and Agree with Plan of Care Patient             Patient will benefit from skilled therapeutic intervention in order to improve the following deficits and impairments:  Abnormal gait, Decreased coordination, Difficulty walking, Dizziness, Decreased balance, Impaired flexibility, Improper body mechanics, Postural dysfunction, Decreased strength  Visit Diagnosis: Dizziness and giddiness  Unsteadiness on feet  Other lack of coordination     Problem List Patient Active Problem List   Diagnosis Date Noted   Benign neoplasm of colon    Hepatic cirrhosis ([place])    Gastritis and gastroduodenitis    Esophageal varices without bleeding (HCC)    Atrial flutter (HCC)    Atypical atrial flutter (HCC)    Aortic stenosis 10/11/2017   Murmur 06/26/2015   History of colon polyps 09/20/2013   Impaired  glucose tolerance 09/20/2013   Exogenous obesity 09/20/2013   Thrombocytopenia, unspecified ([place]) 09/20/2013   Bruit 08/28/2013   Chronic combined systolic and diastolic heart failure ([place]) 12/09/2008   DYSPNEA 12/09/2008   OBESITY 07/19/2008   WEIGHT GAIN 07/15/2008   ADJUSTMENT DISORDER WITH DEPRESSED MOOD 09/07/2007   CELLULITIS, RIGHT LEG 07/04/2007   Dyslipidemia 08/09/2006   Essential hypertension 08/09/2006   MYOCARDIAL INFARCTION, HX OF 08/09/2006   Coronary atherosclerosis 08/09/2006   NEPHROLITHIASIS, HX OF 08/09/2006     Janene Harvey, PT, DPT 11/25/20 4:39 PM   [facility] [address], [place], [place], [address] Phone: [phone]   Fax:  [phone]  Name: [name] MRN: [id number] Date of Birth: Dec 10, 1950

## 2020-11-27 ENCOUNTER — Other Ambulatory Visit: Payer: Self-pay

## 2020-11-27 ENCOUNTER — Ambulatory Visit: Payer: 59 | Admitting: Physical Therapy

## 2020-11-27 ENCOUNTER — Telehealth: Payer: Self-pay | Admitting: Cardiovascular Disease

## 2020-11-27 ENCOUNTER — Telehealth: Payer: Self-pay | Admitting: *Deleted

## 2020-11-27 ENCOUNTER — Encounter: Payer: Self-pay | Admitting: Physical Therapy

## 2020-11-27 DIAGNOSIS — R42 Dizziness and giddiness: Secondary | ICD-10-CM | POA: Diagnosis not present

## 2020-11-27 DIAGNOSIS — R2681 Unsteadiness on feet: Secondary | ICD-10-CM

## 2020-11-27 DIAGNOSIS — R278 Other lack of coordination: Secondary | ICD-10-CM

## 2020-11-27 NOTE — Telephone Encounter (Signed)
Patient is aware and has an appointment scheduled with neurology.  He has not yet heard about his MRI.  Deb is checking on this.

## 2020-11-27 NOTE — Therapy (Signed)
[facility] [address], [place], [place], [address] Phone: [phone]   Fax:  [phone]  Physical Therapy Treatment  Patient Details  Name: [name] MRN: [id number] Date of Birth: [birth year] Referring Provider (PT): Isaac Bliss, Rayford Halsted, MD   Encounter Date: 11/27/2020   PT End of Session - 11/27/20 1445     Visit Number 3    Number of Visits 17    Date for PT Re-Evaluation 01/16/21    Authorization Type UHC    PT Start Time 1406   pt late   PT Stop Time 1443    PT Time Calculation (min) 37 min    Equipment Utilized During Treatment Gait belt    Activity Tolerance Patient tolerated treatment well    Behavior During Therapy [facility] for tasks assessed/performed             Past Medical History:  Diagnosis Date   Adjustment disorder with depressed mood 09/07/2007   Cataract    removed bilaterally   Chronic kidney disease    kidney stones   Cirrhosis ([place])    Colon polyps    Tubular Adenoma 2010   CONGESTIVE HEART FAILURE 12/09/2008   CORONARY ARTERY DISEASE 2006   HYPERLIPIDEMIA 08/09/2006   HYPERTENSION 08/09/2006   MYOCARDIAL INFARCTION, HX OF 07/07/2004   NEPHROLITHIASIS, HX OF 08/09/2006   OBESITY 07/19/2008   Sleep apnea    has a cpap - not using religiously    Past Surgical History:  Procedure Laterality Date   BIOPSY  11/18/2020   Procedure: BIOPSY;  Surgeon: Yetta Flock, MD;  Location: [facility];  Service: Gastroenterology;;   CARDIOVERSION N/A 12/14/2017   Procedure: CARDIOVERSION;  Surgeon: [name], MD;  Location: [place];  Service: Cardiovascular;  Laterality: N/A;   CARDIOVERSION N/A 10/01/2019   Procedure: CARDIOVERSION;  Surgeon: Buford Dresser, MD;  Location: [place];  Service: Cardiovascular;  Laterality: N/A;   COLONOSCOPY     COLONOSCOPY WITH PROPOFOL N/A 11/18/2020   Procedure: COLONOSCOPY WITH PROPOFOL;  Surgeon: Yetta Flock,  MD;  [facility];  Service: Gastroenterology;  Laterality: N/A;   CORONARY ARTERY BYPASS GRAFT  2006   ESOPHAGOGASTRODUODENOSCOPY (EGD) WITH PROPOFOL N/A 11/18/2020   Procedure: ESOPHAGOGASTRODUODENOSCOPY (EGD) WITH PROPOFOL;  Surgeon: Yetta Flock, MD;  [facility];  Service: Gastroenterology;  Laterality: N/A;   POLYPECTOMY     POLYPECTOMY  11/18/2020   Procedure: POLYPECTOMY;  Surgeon: Yetta Flock, MD;  [facility];  Service: Gastroenterology;;    There were no vitals filed for this visit.   Subjective Assessment - 11/27/20 1407     Subjective Denies new issues. Things are sort of the same.    Pertinent History CHF, CKD, CAD, HLD, HTN, CABG s/p MI 2006    Diagnostic tests none recent    Patient Stated Goals improve dizziness    Currently in Pain? No/denies                               [facility] Adult PT Treatment/Exercise - 11/27/20 0001       Neuro Re-ed    Neuro Re-ed Details  romberg on foam 2x30", ant/pos wt shift on foam 10x; 2x20 marching on foam      Knee/Hip Exercises: Seated   Sit to Sand 2 sets;5 reps;without UE support   + VOR cancellation  Vestibular Treatment/Exercise - 11/27/20 0001       Vestibular Treatment/Exercise   Habituation Exercises Legrand Como Daroff   Number of Reps  3    Symptom Description  to each side   c/o feeling 8/10 "woozy" upon laying on R and sitting up from each side; PT assisting patient to sit up at quicker pace                   PT Education - 11/27/20 1444     Education Details update to HEP; Access Code: [email or web address]; discussion on patient's current symptoms and effects on daily life; edu on appropriate level of dizziness with HEP    Person(s) Educated Patient    Methods Explanation;Demonstration;Tactile cues;Verbal cues;Handout    Comprehension Returned demonstration;Verbalized understanding              PT Short Term  Goals - 11/27/20 1449       PT SHORT TERM GOAL #1   Title Patient to be independent with initial HEP.    Time 3    Period Weeks    Status Achieved    Target Date 12/12/20               PT Long Term Goals - 11/25/20 1632       PT LONG TERM GOAL #1   Title Patient to be independent with advanced HEP.    Time 8    Period Weeks    Status On-going      PT LONG TERM GOAL #2   Title Patient to score at least 20/24 on DGI in order to decrease risk of falls.    Time 8    Period Weeks    Status On-going      PT LONG TERM GOAL #3   Title Patient to report 70% improvement in balance confidence with ambulation with/without LRAD.    Time 8    Period Weeks    Status On-going      PT LONG TERM GOAL #4   Title Patient to report 70% improvement in dizziness.    Time 8    Period Weeks    Status On-going      PT LONG TERM GOAL #5   Title Pt will ambulate over outdoor surfaces with LRAD while performing head turns to scan environment with good stability in order to indicate safe community mobility.    Time 8    Period Weeks    Status On-going                   Plan - 11/27/20 1448     Clinical Impression Statement Patient arrived to session without new complaints. Performed habituation exercises with PT assistance to increase speed. Patient reported 8/10 dizziness with these activities upon sitting, but was transient. Worked on [employer] training with patient demonstrating L corrective saccades with VOR cancellation but remained asymptomatic. Worked on static and dynamic balance exercises on compliant surfaces today. Patient demonstrated moderate ankle instability and tendency for posterior sway. Updated HEP with exercise that was well tolerated today and encouraged patient to perform at a speed that elicits 9-1/63 dizziness rather than severe dizziness. Patient reported understanding and without complaints at end of session.    Comorbidities CHF, CKD, CAD, HLD, HTN, CABG s/p MI  2006    PT Treatment/Interventions ADLs/Self Care Home Management;Canalith Repostioning;Cryotherapy;Moist Heat;DME Instruction;Neuromuscular re-education;Balance training;Therapeutic exercise;Therapeutic activities;Functional mobility training;Stair training;Gait training;Patient/family education;Manual techniques;Energy conservation;Dry needling;Passive range of  motion;Taping;Vestibular;Visual/perceptual remediation/compensation    PT Next Visit Plan progress balance training and habituation    Consulted and Agree with Plan of Care Patient             Patient will benefit from skilled therapeutic intervention in order to improve the following deficits and impairments:  Abnormal gait, Decreased coordination, Difficulty walking, Dizziness, Decreased balance, Impaired flexibility, Improper body mechanics, Postural dysfunction, Decreased strength  Visit Diagnosis: Dizziness and giddiness  Unsteadiness on feet  Other lack of coordination     Problem List Patient Active Problem List   Diagnosis Date Noted   Benign neoplasm of colon    Hepatic cirrhosis ([place])    Gastritis and gastroduodenitis    Esophageal varices without bleeding ([place])    Atrial flutter ([place])    Atypical atrial flutter ([place])    Aortic stenosis 10/11/2017   Murmur 06/26/2015   History of colon polyps 09/20/2013   Impaired glucose tolerance 09/20/2013   Exogenous obesity 09/20/2013   Thrombocytopenia, unspecified ([place]) 09/20/2013   Bruit 08/28/2013   Chronic combined systolic and diastolic heart failure ([place]) 12/09/2008   DYSPNEA 12/09/2008   OBESITY 07/19/2008   WEIGHT GAIN 07/15/2008   ADJUSTMENT DISORDER WITH DEPRESSED MOOD 09/07/2007   CELLULITIS, RIGHT LEG 07/04/2007   Dyslipidemia 08/09/2006   Essential hypertension 08/09/2006   MYOCARDIAL INFARCTION, HX OF 08/09/2006   Coronary atherosclerosis 08/09/2006   NEPHROLITHIASIS, HX OF 08/09/2006    Janene Harvey, PT, DPT 11/27/20 2:52 PM   [facility] [address], [place], [place], [address] Phone: [phone]   Fax:  [phone]  Name: PROSPER PAFF MRN: [id number] Date of Birth: November 12, 1950

## 2020-11-27 NOTE — Telephone Encounter (Signed)
Routed to sleep coordinator to review.

## 2020-11-27 NOTE — Telephone Encounter (Signed)
-----   Message from Erline Hau, MD sent at 11/22/2020 10:13 AM EDT ----- Regarding: FW: Unexpected findings Based on recommendation of his vestibular therapist, will order MRI and send to neurology for further evaluation of his dizziness and unsteady gait. ----- Message ----- From: June Leap, PT Sent: 11/21/2020  11:14 AM EDT To: Erline Hau, MD Subject: Unexpected findings                            Hi Dr. Isaac Bliss,  I evaluated Mr. Ricketson today in [place] for vertigo and imbalance today per your referral. He presented with some abnormal oculomotor testing including: corrective saccades with L VOR cancellation, undershooting with L saccadic testing, and B UE intention tremor and dysmetria with coordination testing. Believe he may benefit from further CNS workup or referral to Neuro. Please advise.  Thanks!  Janene Harvey, PT, DPT 11/21/20 11:14 AM

## 2020-11-27 NOTE — Patient Instructions (Signed)
Access Code: [email or web address] URL: [email or web address] Date: 11/27/2020 Prepared by: Grayling Congress  Program Notes perform at a counter top but don't hold on unless you need do   Exercises Romberg Stance with Eyes Closed - 1 x daily - 5 x weekly - 3 sets - 30 sec hold Standing Toe Taps - 1 x daily - 5 x weekly - 2 sets - 10 reps Standing Tandem Balance with Counter Support - 1 x daily - 5 x weekly - 3 sets - 10 reps Brandt-Daroff Vestibular Exercise - 1 x daily - 5 x weekly - 2 sets - 3 reps

## 2020-11-27 NOTE — Telephone Encounter (Signed)
Patient will call [facility] to schedule.

## 2020-11-27 NOTE — Telephone Encounter (Signed)
Patient calling to if there is a different sleep machine he can get. Please advise

## 2020-12-02 ENCOUNTER — Ambulatory Visit: Payer: 59 | Attending: Internal Medicine | Admitting: Physical Therapy

## 2020-12-02 ENCOUNTER — Other Ambulatory Visit: Payer: Self-pay

## 2020-12-02 ENCOUNTER — Encounter: Payer: Self-pay | Admitting: Physical Therapy

## 2020-12-02 DIAGNOSIS — R278 Other lack of coordination: Secondary | ICD-10-CM | POA: Insufficient documentation

## 2020-12-02 DIAGNOSIS — R2681 Unsteadiness on feet: Secondary | ICD-10-CM | POA: Diagnosis present

## 2020-12-02 DIAGNOSIS — R42 Dizziness and giddiness: Secondary | ICD-10-CM | POA: Diagnosis present

## 2020-12-02 NOTE — Therapy (Signed)
[facility] [address], [place], [place], [address] Phone: [phone]   Fax:  [phone]  Physical Therapy Treatment  Patient Details  Name: [name] MRN: [id number] Date of Birth: Nov 08, 1950 Referring Provider (PT): Isaac Bliss, Rayford Halsted, MD   Encounter Date: 12/02/2020   PT End of Session - 12/02/20 1546     Visit Number 4    Number of Visits 17    Date for PT Re-Evaluation 01/16/21    Authorization Type UHC    PT Start Time 0354   pt late   PT Stop Time 1539    PT Time Calculation (min) 42 min    Activity Tolerance Patient tolerated treatment well    Behavior During Therapy [facility] for tasks assessed/performed             Past Medical History:  Diagnosis Date   Adjustment disorder with depressed mood 09/07/2007   Cataract    removed bilaterally   Chronic kidney disease    kidney stones   Cirrhosis ([place])    Colon polyps    Tubular Adenoma 2010   CONGESTIVE HEART FAILURE 12/09/2008   CORONARY ARTERY DISEASE 2006   HYPERLIPIDEMIA 08/09/2006   HYPERTENSION 08/09/2006   MYOCARDIAL INFARCTION, HX OF 07/07/2004   NEPHROLITHIASIS, HX OF 08/09/2006   OBESITY 07/19/2008   Sleep apnea    has a cpap - not using religiously    Past Surgical History:  Procedure Laterality Date   BIOPSY  11/18/2020   Procedure: BIOPSY;  Surgeon: Yetta Flock, MD;  Location: [facility];  Service: Gastroenterology;;   CARDIOVERSION N/A 12/14/2017   Procedure: CARDIOVERSION;  Surgeon: [name], MD;  Location: [place];  Service: Cardiovascular;  Laterality: N/A;   CARDIOVERSION N/A 10/01/2019   Procedure: CARDIOVERSION;  Surgeon: Buford Dresser, MD;  Location: [place];  Service: Cardiovascular;  Laterality: N/A;   COLONOSCOPY     COLONOSCOPY WITH PROPOFOL N/A 11/18/2020   Procedure: COLONOSCOPY WITH PROPOFOL;  Surgeon: Yetta Flock, MD;  [facility];  Service:  Gastroenterology;  Laterality: N/A;   CORONARY ARTERY BYPASS GRAFT  2006   ESOPHAGOGASTRODUODENOSCOPY (EGD) WITH PROPOFOL N/A 11/18/2020   Procedure: ESOPHAGOGASTRODUODENOSCOPY (EGD) WITH PROPOFOL;  Surgeon: Yetta Flock, MD;  [facility];  Service: Gastroenterology;  Laterality: N/A;   POLYPECTOMY     POLYPECTOMY  11/18/2020   Procedure: POLYPECTOMY;  Surgeon: Yetta Flock, MD;  [facility];  Service: Gastroenterology;;    There were no vitals filed for this visit.   Subjective Assessment - 12/02/20 1458     Subjective Apologizes for being late. Pleased with his sessions. "I always feel better after our sessions."    Pertinent History CHF, CKD, CAD, HLD, HTN, CABG s/p MI 2006    Diagnostic tests none recent    Patient Stated Goals improve dizziness    Currently in Pain? No/denies                               [facility] Adult PT Treatment/Exercise - 12/02/20 0001       Neuro Re-ed    Neuro Re-ed Details  R/L prop on elbow + gaze stabilization 5x each, with EC 5x each; grapevine in II bars with supervision and no UE assist 6x;   no dizziness until after opening eyes after last set            Vestibular  Treatment/Exercise - 12/02/20 0001       Vestibular Treatment/Exercise   Habituation Exercises Legrand Como Daroff   Number of Reps  2    Symptom Description  to each side   c/o 5/10 dizziness upon sitting up, decreasing to 4/10 with increased reps               Balance Exercises - 12/02/20 0001       Balance Exercises: Standing   Standing Eyes Opened 1 rep;30 secs;Foam/compliant surface   mild-mod sway   Standing Eyes Closed 2 reps;Foam/compliant surface;30 secs   severe sway and 1 episode LOB with reaching relex to correct   Other Standing Exercises Comments R/L step up on foam 5x each without UE support; R/L double toe tap on cone 10x; R/L toe tap on foam 10x   in II bars but no UE support; good  stability                 PT Short Term Goals - 11/27/20 1449       PT SHORT TERM GOAL #1   Title Patient to be independent with initial HEP.    Time 3    Period Weeks    Status Achieved    Target Date 12/12/20               PT Long Term Goals - 11/25/20 1632       PT LONG TERM GOAL #1   Title Patient to be independent with advanced HEP.    Time 8    Period Weeks    Status On-going      PT LONG TERM GOAL #2   Title Patient to score at least 20/24 on DGI in order to decrease risk of falls.    Time 8    Period Weeks    Status On-going      PT LONG TERM GOAL #3   Title Patient to report 70% improvement in balance confidence with ambulation with/without LRAD.    Time 8    Period Weeks    Status On-going      PT LONG TERM GOAL #4   Title Patient to report 70% improvement in dizziness.    Time 8    Period Weeks    Status On-going      PT LONG TERM GOAL #5   Title Pt will ambulate over outdoor surfaces with LRAD while performing head turns to scan environment with good stability in order to indicate safe community mobility.    Time 8    Period Weeks    Status On-going                   Plan - 12/02/20 1546     Clinical Impression Statement Patient arrived to session with report of being pleased with his PT sessions thus far. Reassessed Nestor Lewandowsky which patient demonstrated good quick pace with, and reported decreased dizziness compared to last session and further improved with increased reps. Worked on dynamic balance exercises on firm and foam surface with patient requiring intermittent cueing for safe foot placement and increasing step length. Exercises on foam revealed mild-severe sway; worse with EC. Patient was able to tolerance increased challenge with balance activities today with no-minimal UE support. No complaints at end of session. Patient is progressing well towards goals.    Comorbidities CHF, CKD, CAD, HLD, HTN, CABG s/p MI 2006     PT Treatment/Interventions ADLs/Self Care  Home Management;Canalith Repostioning;Cryotherapy;Moist Heat;DME Instruction;Neuromuscular re-education;Balance training;Therapeutic exercise;Therapeutic activities;Functional mobility training;Stair training;Gait training;Patient/family education;Manual techniques;Energy conservation;Dry needling;Passive range of motion;Taping;Vestibular;Visual/perceptual remediation/compensation    PT Next Visit Plan progress balance training and habituation    Consulted and Agree with Plan of Care Patient             Patient will benefit from skilled therapeutic intervention in order to improve the following deficits and impairments:  Abnormal gait, Decreased coordination, Difficulty walking, Dizziness, Decreased balance, Impaired flexibility, Improper body mechanics, Postural dysfunction, Decreased strength  Visit Diagnosis: Dizziness and giddiness  Unsteadiness on feet  Other lack of coordination     Problem List Patient Active Problem List   Diagnosis Date Noted   Benign neoplasm of colon    Hepatic cirrhosis ([place])    Gastritis and gastroduodenitis    Esophageal varices without bleeding ([place])    Atrial flutter ([facility])    Atypical atrial flutter ([facility])    Aortic stenosis 10/11/2017   Murmur 06/26/2015   History of colon polyps 09/20/2013   Impaired glucose tolerance 09/20/2013   Exogenous obesity 09/20/2013   Thrombocytopenia, unspecified ([place]) 09/20/2013   Bruit 08/28/2013   Chronic combined systolic and diastolic heart failure ([place]) 12/09/2008   DYSPNEA 12/09/2008   OBESITY 07/19/2008   WEIGHT GAIN 07/15/2008   ADJUSTMENT DISORDER WITH DEPRESSED MOOD 09/07/2007   CELLULITIS, RIGHT LEG 07/04/2007   Dyslipidemia 08/09/2006   Essential hypertension 08/09/2006   MYOCARDIAL INFARCTION, HX OF 08/09/2006   Coronary atherosclerosis 08/09/2006   NEPHROLITHIASIS, HX OF 08/09/2006    Janene Harvey, PT, DPT 12/02/20 3:51 PM   [facility] [address], [place], [place], [address] Phone: [phone]   Fax:  [phone]  Name: MALIK PAAR MRN: [id number] Date of Birth: [birth year]

## 2020-12-04 ENCOUNTER — Ambulatory Visit: Payer: Self-pay | Admitting: Physical Therapy

## 2020-12-05 NOTE — Progress Notes (Signed)
HPI: FU CAD, AS and atrial flutter/fibrillation; history of coronary artery disease, status post coronary artery bypass graft surgery performed in June 2006. Patient had a LIMA to the LAD, saphenous vein graft to the diagonal, saphenous vein graft to the acute marginal and saphenous vein graft to the PDA. Carotid Dopplers August 2015 showed no significant stenosis. Nuclear study August 2015 showed ejection fraction 55% but no ischemia or infarction. Possible mild decrease in systolic blood pressure at peak exercise. Abdominal ultrasound June 2017 showed no aneurysm. Patient also with h/o atrial fibrillation/atrial flutter. Monitor December 2021 showed sinus rhythm with rare PAC and PVC.  Abdominal ultrasound June 2022 showed no aneurysm.  EGD October 2022 showed 1 moderate varix.  Echocardiogram repeated November 2022 and showed normal LV function, mild left ventricular enlargement, grade 2 diastolic dysfunction, mild left atrial enlargement, mild mitral regurgitation, moderate tricuspid regurgitation, moderate aortic stenosis with mean gradient 33 mmHg; DI 0.31.  Since last seen he denies increased dyspnea on exertion, orthopnea, PND, chest pain or syncope.  He has chronic pedal edema unchanged.  Current Outpatient Medications  Medication Sig Dispense Refill   acetaminophen (TYLENOL) 500 MG tablet Take 1,000 mg by mouth daily as needed for moderate pain or headache.     apixaban (ELIQUIS) 5 MG TABS tablet TAKE 1 TABLET BY MOUTH TWICE A DAY 180 tablet 1   atorvastatin (LIPITOR) 80 MG tablet TAKE 1 TABLET BY MOUTH EVERY DAY 90 tablet 1   carvedilol (COREG) 3.125 MG tablet Take 1 tablet (3.125 mg total) by mouth 2 (two) times daily with a meal. 60 tablet 2   furosemide (LASIX) 20 MG tablet TAKE 3 TABLETS (60MG ) IN THE MORNING AND 2 TABLETS (40MG ) IN THE EVENING. 80 tablet 2   hydrocortisone 2.5 % cream Apply 1 application topically daily as needed (irritation).     KLOR-CON M20 20 MEQ tablet TAKE 1  TABLET BY MOUTH EVERY DAY 90 tablet 3   tamsulosin (FLOMAX) 0.4 MG CAPS capsule TAKE ONE CAPSULE BY MOUTH EVERY DAY AFTER SUPPER 90 capsule 1   vitamin B-12 (CYANOCOBALAMIN) 1000 MCG tablet Take 1 tablet (1,000 mcg total) by mouth daily. 30 tablet 0   No current facility-administered medications for this visit.     Past Medical History:  Diagnosis Date   Adjustment disorder with depressed mood 09/07/2007   Cataract    removed bilaterally   Chronic kidney disease    kidney stones   Cirrhosis ([place])    Colon polyps    Tubular Adenoma 2010   CONGESTIVE HEART FAILURE 12/09/2008   CORONARY ARTERY DISEASE 2006   HYPERLIPIDEMIA 08/09/2006   HYPERTENSION 08/09/2006   MYOCARDIAL INFARCTION, HX OF 07/07/2004   NEPHROLITHIASIS, HX OF 08/09/2006   OBESITY 07/19/2008   Sleep apnea    has a cpap - not using religiously    Past Surgical History:  Procedure Laterality Date   BIOPSY  11/18/2020   Procedure: BIOPSY;  Surgeon: Yetta Flock, MD;  [facility];  Service: Gastroenterology;;   CARDIOVERSION N/A 12/14/2017   Procedure: CARDIOVERSION;  Surgeon: [name], MD;  Location: [place];  Service: Cardiovascular;  Laterality: N/A;   CARDIOVERSION N/A 10/01/2019   Procedure: CARDIOVERSION;  Surgeon: Buford Dresser, MD;  Location: [place];  Service: Cardiovascular;  Laterality: N/A;   COLONOSCOPY     COLONOSCOPY WITH PROPOFOL N/A 11/18/2020   Procedure: COLONOSCOPY WITH PROPOFOL;  Surgeon: Yetta Flock, MD;  [facility];  Service: Gastroenterology;  Laterality: N/A;   CORONARY ARTERY BYPASS GRAFT  2006   ESOPHAGOGASTRODUODENOSCOPY (EGD) WITH PROPOFOL N/A 11/18/2020   Procedure: ESOPHAGOGASTRODUODENOSCOPY (EGD) WITH PROPOFOL;  Surgeon: Yetta Flock, MD;  [facility];  Service: Gastroenterology;  Laterality: N/A;   POLYPECTOMY     POLYPECTOMY  11/18/2020   Procedure: POLYPECTOMY;  Surgeon: Yetta Flock,  MD;  [facility];  Service: Gastroenterology;;    Social History   Socioeconomic History   Marital status: Single    Spouse name: Not on file   Number of children: 0   Years of education: Not on file   Highest education level: Not on file  Occupational History   Occupation: [employer], [employer]ellery  Tobacco Use   Smoking status: Never   Smokeless tobacco: Never  Vaping Use   Vaping Use: Never used  Substance and Sexual Activity   Alcohol use: Yes    Alcohol/week: 1.0 standard drink    Types: 1 Cans of beer per week    Comment: occasional   Drug use: No   Sexual activity: Not on file  Other Topics Concern   Not on file  Social History Narrative   Not on file   Social Determinants of Health   Financial Resource Strain: Not on file  Food Insecurity: Not on file  Transportation Needs: Not on file  Physical Activity: Not on file  Stress: Not on file  Social Connections: Not on file  Intimate Partner Violence: Not on file    Family History  Problem Relation Age of Onset   Hypertension Mother    Hyperlipidemia Mother    Heart disease Father    Ulcers Father    Bipolar disorder Brother    Colon cancer Neg Hx    Colon polyps Neg Hx    Esophageal cancer Neg Hx    Rectal cancer Neg Hx    Stomach cancer Neg Hx     ROS: no fevers or chills, productive cough, hemoptysis, dysphasia, odynophagia, melena, hematochezia, dysuria, hematuria, rash, seizure activity, orthopnea, PND, claudication. Remaining systems are negative.  Physical Exam: Well-developed well-nourished in no acute distress.  Skin is warm and dry.  HEENT is normal.  Neck is supple.  Chest is clear to auscultation with normal expansion.  Cardiovascular exam is regular rate and rhythm.  3/6 systolic murmur left sternal border. Abdominal exam nontender or distended. No masses palpated. Extremities show 2 + ankle edema. neuro grossly intact    A/P  1 paroxysmal atrial  fibrillation-patient is in sinus rhythm on exam today.  Continue apixaban.  As outlined previously if he has more frequent episodes we will consider referral for Tikosyn.  2 aortic stenosis-moderate on most recent echocardiogram. He denies increased dyspnea, chest pain or syncope.  He will need follow-up echo November 2023 or sooner if he develops symptoms. He understands he will likely require TAVR in the near future.  3 coronary artery disease-he denies chest pain.  Plan to continue medical therapy.  Continue statin.  No aspirin given need for anticoagulation.  4 chronic combined systolic/diastolic congestive heart failure-volume status is unchanged today.  We will continue diuretic at present dose.    5 hyperlipidemia-continue statin.  6 hypertension-blood pressure controlled.  Continue present medical regimen.  7 history of cirrhosis-1 moderate varix on recent EGD.  Kirk Ruths, MD

## 2020-12-08 ENCOUNTER — Ambulatory Visit: Payer: 59 | Admitting: Cardiovascular Disease

## 2020-12-09 ENCOUNTER — Ambulatory Visit: Payer: Self-pay | Admitting: Physical Therapy

## 2020-12-09 NOTE — Telephone Encounter (Signed)
Returned a call to the patient to see if I could assist him. He wanted to know if there is a alternative to using CPAP. He had been seeing the commercials about the inspire. I told him this would be a discussion he would need to have with Dr Claiborne Billings. He was also semi educated by me as to what the inspire device is and how it works. Once he was informed, he decided that it is not for him. He was not previously aware that the device is something that has to be surgically implanted into your body. He states that he will continue with his CPAP device. ( Which in airview shows that he is not using.)

## 2020-12-10 ENCOUNTER — Ambulatory Visit (HOSPITAL_COMMUNITY): Payer: 59 | Attending: Cardiology

## 2020-12-10 ENCOUNTER — Other Ambulatory Visit: Payer: Self-pay

## 2020-12-10 DIAGNOSIS — I5032 Chronic diastolic (congestive) heart failure: Secondary | ICD-10-CM | POA: Insufficient documentation

## 2020-12-10 DIAGNOSIS — I350 Nonrheumatic aortic (valve) stenosis: Secondary | ICD-10-CM

## 2020-12-10 LAB — ECHOCARDIOGRAM COMPLETE
AR max vel: 1.6 cm2
AV Area VTI: 1.54 cm2
AV Area mean vel: 1.59 cm2
AV Mean grad: 33 mmHg
AV Peak grad: 56 mmHg
Ao pk vel: 3.74 m/s
Area-P 1/2: 3.03 cm2
S' Lateral: 4.2 cm

## 2020-12-10 MED ORDER — PERFLUTREN LIPID MICROSPHERE
1.0000 mL | INTRAVENOUS | Status: AC | PRN
  Administered 2020-12-10: 1 mL via INTRAVENOUS

## 2020-12-11 ENCOUNTER — Ambulatory Visit: Payer: 59 | Admitting: Physical Therapy

## 2020-12-11 ENCOUNTER — Encounter: Payer: Self-pay | Admitting: Physical Therapy

## 2020-12-11 ENCOUNTER — Ambulatory Visit (INDEPENDENT_AMBULATORY_CARE_PROVIDER_SITE_OTHER): Payer: 59 | Admitting: Gastroenterology

## 2020-12-11 DIAGNOSIS — Z23 Encounter for immunization: Secondary | ICD-10-CM | POA: Diagnosis not present

## 2020-12-11 DIAGNOSIS — R278 Other lack of coordination: Secondary | ICD-10-CM

## 2020-12-11 DIAGNOSIS — K7460 Unspecified cirrhosis of liver: Secondary | ICD-10-CM

## 2020-12-11 DIAGNOSIS — R42 Dizziness and giddiness: Secondary | ICD-10-CM | POA: Diagnosis not present

## 2020-12-11 DIAGNOSIS — R2681 Unsteadiness on feet: Secondary | ICD-10-CM

## 2020-12-11 NOTE — Progress Notes (Signed)
Patient arrived at office today to receive second Heplisav B vaccine. Injected 0.5 ml into left deltoid. Patient tolerated immunization well. No complications noted. Pt advised of expected side effects of vaccine. Pt verbalized understanding of all information and had no concerns at the end of the visit.  Heplisav B 0.5 ml IM injection LOT #[id number] EXP: 04/30/22

## 2020-12-11 NOTE — Therapy (Signed)
[facility] [address], [place], [place], [address] Phone: [phone]   Fax:  [phone]  Physical Therapy Treatment  Patient Details  Name: [name] MRN: [id number] Date of Birth: [birth year] Referring Provider (PT): Isaac Bliss, Rayford Halsted, MD   Encounter Date: 12/11/2020   PT End of Session - 12/11/20 1659     Visit Number 5    Number of Visits 17    Date for PT Re-Evaluation 01/16/21    Authorization Type UHC    PT Start Time 1618    PT Stop Time 1324    PT Time Calculation (min) 41 min    Equipment Utilized During Treatment Gait belt    Activity Tolerance Patient tolerated treatment well    Behavior During Therapy [facility] for tasks assessed/performed             Past Medical History:  Diagnosis Date   Adjustment disorder with depressed mood 09/07/2007   Cataract    removed bilaterally   Chronic kidney disease    kidney stones   Cirrhosis ([place])    Colon polyps    Tubular Adenoma 2010   CONGESTIVE HEART FAILURE 12/09/2008   CORONARY ARTERY DISEASE 2006   HYPERLIPIDEMIA 08/09/2006   HYPERTENSION 08/09/2006   MYOCARDIAL INFARCTION, HX OF 07/07/2004   NEPHROLITHIASIS, HX OF 08/09/2006   OBESITY 07/19/2008   Sleep apnea    has a cpap - not using religiously    Past Surgical History:  Procedure Laterality Date   BIOPSY  11/18/2020   Procedure: BIOPSY;  Surgeon: Yetta Flock, MD;  Location: [facility];  Service: Gastroenterology;;   CARDIOVERSION N/A 12/14/2017   Procedure: CARDIOVERSION;  Surgeon: [name], MD;  Location: [facility];  Service: Cardiovascular;  Laterality: N/A;   CARDIOVERSION N/A 10/01/2019   Procedure: CARDIOVERSION;  Surgeon: Buford Dresser, MD;  Location: [facility];  Service: Cardiovascular;  Laterality: N/A;   COLONOSCOPY     COLONOSCOPY WITH PROPOFOL N/A 11/18/2020   Procedure: COLONOSCOPY WITH PROPOFOL;  Surgeon: Yetta Flock, MD;   [facility];  Service: Gastroenterology;  Laterality: N/A;   CORONARY ARTERY BYPASS GRAFT  2006   ESOPHAGOGASTRODUODENOSCOPY (EGD) WITH PROPOFOL N/A 11/18/2020   Procedure: ESOPHAGOGASTRODUODENOSCOPY (EGD) WITH PROPOFOL;  Surgeon: Yetta Flock, MD;  [facility];  Service: Gastroenterology;  Laterality: N/A;   POLYPECTOMY     POLYPECTOMY  11/18/2020   Procedure: POLYPECTOMY;  Surgeon: Yetta Flock, MD;  [facility];  Service: Gastroenterology;;    There were no vitals filed for this visit.   Subjective Assessment - 12/11/20 1619     Subjective Has been noticing a little more imbalance when walking for the past couple days; admits to not doing his HEP for the past couple days either.    Pertinent History CHF, CKD, CAD, HLD, HTN, CABG s/p MI 2006    Diagnostic tests none recent    Patient Stated Goals improve dizziness    Currently in Pain? No/denies                               [facility] Adult PT Treatment/Exercise - 12/11/20 0001       Neuro Re-ed    Neuro Re-ed Details  STS on foam + gaze on optokinetic background 10x; standing and walking  + throwing/catching red medball while following trajectory of ball; walking + horizontal head turns with 1 and  2 targets with CGA; rockerboard M/L and ant/pos static balance 30" and dynamic wt shifts 10x; R/L fwd/back step with head nods to targets 10x each             Vestibular Treatment/Exercise - 12/11/20 0001       Vestibular Treatment/Exercise   Gaze Exercises X1 Viewing Horizontal      X1 Viewing Horizontal   Foot Position sitting and standing with optokinetic background    Reps 10   2x10   Comments c/o "uneasiness"                      PT Short Term Goals - 11/27/20 1449       PT SHORT TERM GOAL #1   Title Patient to be independent with initial HEP.    Time 3    Period Weeks    Status Achieved    Target Date 12/12/20               PT  Long Term Goals - 11/25/20 1632       PT LONG TERM GOAL #1   Title Patient to be independent with advanced HEP.    Time 8    Period Weeks    Status On-going      PT LONG TERM GOAL #2   Title Patient to score at least 20/24 on DGI in order to decrease risk of falls.    Time 8    Period Weeks    Status On-going      PT LONG TERM GOAL #3   Title Patient to report 70% improvement in balance confidence with ambulation with/without LRAD.    Time 8    Period Weeks    Status On-going      PT LONG TERM GOAL #4   Title Patient to report 70% improvement in dizziness.    Time 8    Period Weeks    Status On-going      PT LONG TERM GOAL #5   Title Pt will ambulate over outdoor surfaces with LRAD while performing head turns to scan environment with good stability in order to indicate safe community mobility.    Time 8    Period Weeks    Status On-going                   Plan - 12/11/20 1700     Clinical Impression Statement Patient arrived to session with report of more imbalance for the past couple of days. Worked on Land with VOR and balance training. Patient tolerated this quite well without considerable complaints. Able to perform gait with dual task and head turns with good stability; patient reporting comfort d/t having CGA, thus may need to wean this in future sessions. Most challenge today was demonstrated with anterior/posterior rockerboard activity d/t hesitancy and instability. Improved after increased reps. Patient tolerated today's session well and was able to tolerate more challenging balance activities. No complaints at end of session.    Comorbidities CHF, CKD, CAD, HLD, HTN, CABG s/p MI 2006    PT Treatment/Interventions ADLs/Self Care Home Management;Canalith Repostioning;Cryotherapy;Moist Heat;DME Instruction;Neuromuscular re-education;Balance training;Therapeutic exercise;Therapeutic activities;Functional mobility training;Stair training;Gait  training;Patient/family education;Manual techniques;Energy conservation;Dry needling;Passive range of motion;Taping;Vestibular;Visual/perceptual remediation/compensation    PT Next Visit Plan progress balance training and habituation    Consulted and Agree with Plan of Care Patient             Patient will benefit from skilled therapeutic intervention in order  to improve the following deficits and impairments:  Abnormal gait, Decreased coordination, Difficulty walking, Dizziness, Decreased balance, Impaired flexibility, Improper body mechanics, Postural dysfunction, Decreased strength  Visit Diagnosis: Dizziness and giddiness  Unsteadiness on feet  Other lack of coordination     Problem List Patient Active Problem List   Diagnosis Date Noted   Benign neoplasm of colon    Hepatic cirrhosis ([place])    Gastritis and gastroduodenitis    Esophageal varices without bleeding ([place])    Atrial flutter ([place])    Atypical atrial flutter ([place])    Aortic stenosis 10/11/2017   Murmur 06/26/2015   History of colon polyps 09/20/2013   Impaired glucose tolerance 09/20/2013   Exogenous obesity 09/20/2013   Thrombocytopenia, unspecified ([place]) 09/20/2013   Bruit 08/28/2013   Chronic combined systolic and diastolic heart failure ([place]) 12/09/2008   DYSPNEA 12/09/2008   OBESITY 07/19/2008   WEIGHT GAIN 07/15/2008   ADJUSTMENT DISORDER WITH DEPRESSED MOOD 09/07/2007   CELLULITIS, RIGHT LEG 07/04/2007   Dyslipidemia 08/09/2006   Essential hypertension 08/09/2006   MYOCARDIAL INFARCTION, HX OF 08/09/2006   Coronary atherosclerosis 08/09/2006   NEPHROLITHIASIS, HX OF 08/09/2006    Janene Harvey, PT, DPT 12/11/20 5:02 PM   [facility] [address] [place], [place], [address] Phone: [phone]   Fax:  [phone]  Name: STANTON KISSOON MRN: [id number] Date of Birth: 1950-06-24

## 2020-12-15 ENCOUNTER — Ambulatory Visit: Payer: 59 | Admitting: Cardiology

## 2020-12-15 ENCOUNTER — Other Ambulatory Visit: Payer: Self-pay

## 2020-12-15 ENCOUNTER — Encounter: Payer: Self-pay | Admitting: Cardiology

## 2020-12-15 VITALS — BP 140/60 | HR 72 | Ht 66.0 in | Wt 224.0 lb

## 2020-12-15 DIAGNOSIS — I2510 Atherosclerotic heart disease of native coronary artery without angina pectoris: Secondary | ICD-10-CM | POA: Diagnosis not present

## 2020-12-15 DIAGNOSIS — I350 Nonrheumatic aortic (valve) stenosis: Secondary | ICD-10-CM | POA: Diagnosis not present

## 2020-12-15 DIAGNOSIS — I480 Paroxysmal atrial fibrillation: Secondary | ICD-10-CM | POA: Diagnosis not present

## 2020-12-15 DIAGNOSIS — I5032 Chronic diastolic (congestive) heart failure: Secondary | ICD-10-CM | POA: Diagnosis not present

## 2020-12-15 DIAGNOSIS — E785 Hyperlipidemia, unspecified: Secondary | ICD-10-CM

## 2020-12-15 DIAGNOSIS — I10 Essential (primary) hypertension: Secondary | ICD-10-CM

## 2020-12-15 NOTE — Patient Instructions (Signed)

## 2020-12-16 ENCOUNTER — Ambulatory Visit: Payer: Self-pay | Admitting: Physical Therapy

## 2020-12-18 ENCOUNTER — Ambulatory Visit: Payer: 59 | Admitting: Physical Therapy

## 2020-12-18 ENCOUNTER — Other Ambulatory Visit: Payer: Self-pay | Admitting: Cardiology

## 2020-12-18 ENCOUNTER — Other Ambulatory Visit: Payer: Self-pay

## 2020-12-18 ENCOUNTER — Encounter: Payer: Self-pay | Admitting: Physical Therapy

## 2020-12-18 VITALS — BP 158/62 | HR 103

## 2020-12-18 DIAGNOSIS — R278 Other lack of coordination: Secondary | ICD-10-CM

## 2020-12-18 DIAGNOSIS — R42 Dizziness and giddiness: Secondary | ICD-10-CM

## 2020-12-18 DIAGNOSIS — R2681 Unsteadiness on feet: Secondary | ICD-10-CM

## 2020-12-18 NOTE — Therapy (Signed)
[facility] [address] [place], [place], [address] Phone: [phone]   Fax:  [phone]  Patient Details  Name: [name] MRN: [id number] Date of Birth: 07-11-1950 Referring Provider:  Isaac Bliss, [place]*  Encounter Date: 12/18/2020   Vitals:   12/18/20 1504 12/18/20 1522  BP: (!) 151/62 (!) 158/62  Pulse: (!) 103 (!) 103     Patient arrived to session late d/t traffic and reports being in a rush as a result. Patient appeared slightly slow-moving and demonstrated shaking upon sitting on exercise mat. Reports 8/10 LBP. Patient appeared hypotensive and tachycardic. Provided water/snack break and allowed patient to rest. Patient appeared to be slightly less shaky however vitals unchanged. Reports compliance with BP medication. Patient denies dizziness, lightheadedness, HA, or other red flag symptoms. Reports that he feels safe to drive, thus allowed patient to leave independently.    Janene Harvey, PT, DPT 12/18/20 3:28 PM   [facility] [address], [place], [place], [address] Phone: [phone]   Fax:  [phone]

## 2020-12-22 ENCOUNTER — Ambulatory Visit
Admission: RE | Admit: 2020-12-22 | Discharge: 2020-12-22 | Disposition: A | Discharge status: Home / Self Care | Payer: 59 | Source: Ambulatory Visit | Attending: Internal Medicine | Admitting: Internal Medicine

## 2020-12-22 DIAGNOSIS — R42 Dizziness and giddiness: Secondary | ICD-10-CM

## 2020-12-24 ENCOUNTER — Ambulatory Visit: Payer: 59 | Admitting: Physical Therapy

## 2020-12-24 ENCOUNTER — Other Ambulatory Visit: Payer: Self-pay

## 2020-12-24 ENCOUNTER — Encounter: Payer: Self-pay | Admitting: Physical Therapy

## 2020-12-24 DIAGNOSIS — R42 Dizziness and giddiness: Secondary | ICD-10-CM | POA: Diagnosis not present

## 2020-12-24 DIAGNOSIS — R2681 Unsteadiness on feet: Secondary | ICD-10-CM

## 2020-12-24 DIAGNOSIS — R278 Other lack of coordination: Secondary | ICD-10-CM

## 2020-12-24 NOTE — Therapy (Signed)
[facility] [address], [place], [place], [address] Phone: [phone]   Fax:  [phone]  Physical Therapy Treatment  Patient Details  Name: [name] MRN: [id number] Date of Birth: Jun 19, 1950 Referring Provider (PT): Isaac Bliss, Rayford Halsted, MD   Encounter Date: 12/24/2020   PT End of Session - 12/24/20 1536     Visit Number 6    Number of Visits 17    Date for PT Re-Evaluation 01/16/21    Authorization Type UHC    PT Start Time 1450    PT Stop Time 1531    PT Time Calculation (min) 41 min    Equipment Utilized During Treatment Gait belt    Activity Tolerance Patient tolerated treatment well    Behavior During Therapy [facility] for tasks assessed/performed             Past Medical History:  Diagnosis Date   Adjustment disorder with depressed mood 09/07/2007   Cataract    removed bilaterally   Chronic kidney disease    kidney stones   Cirrhosis ([place])    Colon polyps    Tubular Adenoma 2010   CONGESTIVE HEART FAILURE 12/09/2008   CORONARY ARTERY DISEASE 2006   HYPERLIPIDEMIA 08/09/2006   HYPERTENSION 08/09/2006   MYOCARDIAL INFARCTION, HX OF 07/07/2004   NEPHROLITHIASIS, HX OF 08/09/2006   OBESITY 07/19/2008   Sleep apnea    has a cpap - not using religiously    Past Surgical History:  Procedure Laterality Date   BIOPSY  11/18/2020   Procedure: BIOPSY;  Surgeon: Yetta Flock, MD;  Location[facility];  Service: Gastroenterology;;   CARDIOVERSION N/A 12/14/2017   Procedure: CARDIOVERSION;  Surgeon: [name], MD;  Location: [facility];  Service: Cardiovascular;  Laterality: N/A;   CARDIOVERSION N/A 10/01/2019   Procedure: CARDIOVERSION;  Surgeon: Buford Dresser, MD;  Location: [facility];  Service: Cardiovascular;  Laterality: N/A;   COLONOSCOPY     COLONOSCOPY WITH PROPOFOL N/A 11/18/2020   Procedure: COLONOSCOPY WITH PROPOFOL;  Surgeon: Yetta Flock, MD;   [facility];  Service: Gastroenterology;  Laterality: N/A;   CORONARY ARTERY BYPASS GRAFT  2006   ESOPHAGOGASTRODUODENOSCOPY (EGD) WITH PROPOFOL N/A 11/18/2020   Procedure: ESOPHAGOGASTRODUODENOSCOPY (EGD) WITH PROPOFOL;  Surgeon: Yetta Flock, MD;  [facility];  Service: Gastroenterology;  Laterality: N/A;   POLYPECTOMY     POLYPECTOMY  11/18/2020   Procedure: POLYPECTOMY;  Surgeon: Yetta Flock, MD;  [facility];  Service: Gastroenterology;;    There were no vitals filed for this visit.   Subjective Assessment - 12/24/20 1451     Subjective Feeling much better than last session. Feels that he became ill and got a fever blister after being outside and playing golf.  Will be going on a 4 day golf trip at [facility] next week.    Pertinent History CHF, CKD, CAD, HLD, HTN, CABG s/p MI 2006    Diagnostic tests none recent    Patient Stated Goals improve dizziness    Currently in Pain? No/denies                               [facility] Adult PT Treatment/Exercise - 12/24/20 0001       Neuro Re-ed    Neuro Re-ed Details  R/L prop on forearm with EC 5x, with EC and sitting on dynadisc 5x, 5x sitting on dynadisc and  gaze on optokinetic background; toe tap b/w 2 light up targets standing on firm and foam surface with each LE x20- CGA-mod A d/t LOB   c/o unsteadiness on dynadisc     Knee/Hip Exercises: Standing   Gait Training gait + ball throw/catch with patient and throwing to PT 2x134ft             Vestibular Treatment/Exercise - 12/24/20 0001       Nestor Lewandowsky   Number of Reps  3    Symptom Description  to each side EO 1x, EC 2x; additional 2x to R with EC   report of 7/10 dizziness with EC; cues to decrease speed of movement to improve tolerance     X1 Viewing Horizontal   Foot Position standing with 1 foot on dynadisc    Reps 10   2x10   Comments looking at optokinetic background   cues to maintain cues on  target                   PT Education - 12/24/20 1535     Education Details update to HEP= Access Code: [email or web address]; advised to perform Nestor Lewandowsky with [facility]    Person(s) Educated Patient    Methods Explanation;Demonstration;Tactile cues;Verbal cues;Handout    Comprehension Returned demonstration;Verbalized understanding              PT Short Term Goals - 11/27/20 1449       PT SHORT TERM GOAL #1   Title Patient to be independent with initial HEP.    Time 3    Period Weeks    Status Achieved    Target Date 12/12/20               PT Long Term Goals - 11/25/20 1632       PT LONG TERM GOAL #1   Title Patient to be independent with advanced HEP.    Time 8    Period Weeks    Status On-going      PT LONG TERM GOAL #2   Title Patient to score at least 20/24 on DGI in order to decrease risk of falls.    Time 8    Period Weeks    Status On-going      PT LONG TERM GOAL #3   Title Patient to report 70% improvement in balance confidence with ambulation with/without LRAD.    Time 8    Period Weeks    Status On-going      PT LONG TERM GOAL #4   Title Patient to report 70% improvement in dizziness.    Time 8    Period Weeks    Status On-going      PT LONG TERM GOAL #5   Title Pt will ambulate over outdoor surfaces with LRAD while performing head turns to scan environment with good stability in order to indicate safe community mobility.    Time 8    Period Weeks    Status On-going                   Plan - 12/24/20 1536     Clinical Impression Statement Patient arrived to session with report of feeling better than last session. No complaints today. Increased challenge with habituation by having patient close eyes- c/o increased challenge, particularly on R side. Encouraged patient to decrease speed of movement to maintain symptoms below moderate threshold. Worked on dual tasking gait with patient demonstrating slow gait speed but able  to reach out  of BOS freely. Most balance challenge was demonstrated with quick toe taps to targets, intermittently requiring CGA-mod A. Patient tolerated session well and without complaints at end of session.    Comorbidities CHF, CKD, CAD, HLD, HTN, CABG s/p MI 2006    PT Treatment/Interventions ADLs/Self Care Home Management;Canalith Repostioning;Cryotherapy;Moist Heat;DME Instruction;Neuromuscular re-education;Balance training;Therapeutic exercise;Therapeutic activities;Functional mobility training;Stair training;Gait training;Patient/family education;Manual techniques;Energy conservation;Dry needling;Passive range of motion;Taping;Vestibular;Visual/perceptual remediation/compensation    PT Next Visit Plan progress balance training and habituation    Consulted and Agree with Plan of Care Patient             Patient will benefit from skilled therapeutic intervention in order to improve the following deficits and impairments:  Abnormal gait, Decreased coordination, Difficulty walking, Dizziness, Decreased balance, Impaired flexibility, Improper body mechanics, Postural dysfunction, Decreased strength  Visit Diagnosis: Dizziness and giddiness  Unsteadiness on feet  Other lack of coordination     Problem List Patient Active Problem List   Diagnosis Date Noted   Benign neoplasm of colon    Hepatic cirrhosis ([place])    Gastritis and gastroduodenitis    Esophageal varices without bleeding ([place])    Atrial flutter ([facility])    Atypical atrial flutter ([facility])    Aortic stenosis 10/11/2017   Murmur 06/26/2015   History of colon polyps 09/20/2013   Impaired glucose tolerance 09/20/2013   Exogenous obesity 09/20/2013   Thrombocytopenia, unspecified ([place]) 09/20/2013   Bruit 08/28/2013   Chronic combined systolic and diastolic heart failure ([place]) 12/09/2008   DYSPNEA 12/09/2008   OBESITY 07/19/2008   WEIGHT GAIN 07/15/2008   ADJUSTMENT DISORDER WITH DEPRESSED MOOD 09/07/2007   CELLULITIS, RIGHT LEG  07/04/2007   Dyslipidemia 08/09/2006   Essential hypertension 08/09/2006   MYOCARDIAL INFARCTION, HX OF 08/09/2006   Coronary atherosclerosis 08/09/2006   NEPHROLITHIASIS, HX OF 08/09/2006     Janene Harvey, PT, DPT 12/24/20 3:40 PM   [facility] [address], [place], [place], [address] Phone: [phone]   Fax:  [phone]  Name: [name] MRN: [id number] Date of Birth: 07-08-1950

## 2021-01-05 ENCOUNTER — Other Ambulatory Visit: Payer: Self-pay

## 2021-01-05 ENCOUNTER — Ambulatory Visit (HOSPITAL_COMMUNITY)
Admission: RE | Admit: 2021-01-05 | Discharge: 2021-01-05 | Disposition: A | Discharge status: Home / Self Care | Payer: 59 | Source: Ambulatory Visit | Attending: Gastroenterology | Admitting: Gastroenterology

## 2021-01-05 DIAGNOSIS — K7460 Unspecified cirrhosis of liver: Secondary | ICD-10-CM | POA: Insufficient documentation

## 2021-01-05 DIAGNOSIS — Z86010 Personal history of colonic polyps: Secondary | ICD-10-CM | POA: Insufficient documentation

## 2021-01-06 ENCOUNTER — Ambulatory Visit: Payer: 59 | Admitting: Physical Therapy

## 2021-01-07 ENCOUNTER — Other Ambulatory Visit: Payer: Self-pay

## 2021-01-07 ENCOUNTER — Telehealth: Payer: Self-pay | Admitting: Gastroenterology

## 2021-01-07 DIAGNOSIS — K7460 Unspecified cirrhosis of liver: Secondary | ICD-10-CM

## 2021-01-07 NOTE — Telephone Encounter (Signed)
Returned call to patient. We have reviewed his [place] results and recommendations. See 12/5 [place] result note for more information.

## 2021-01-07 NOTE — Telephone Encounter (Signed)
Patient returned the call for [place] results said his cell number is best to call him back.

## 2021-01-08 ENCOUNTER — Ambulatory Visit: Payer: 59 | Attending: Internal Medicine | Admitting: Physical Therapy

## 2021-01-08 ENCOUNTER — Other Ambulatory Visit: Payer: Self-pay

## 2021-01-08 ENCOUNTER — Encounter: Payer: Self-pay | Admitting: Physical Therapy

## 2021-01-08 DIAGNOSIS — R278 Other lack of coordination: Secondary | ICD-10-CM | POA: Insufficient documentation

## 2021-01-08 DIAGNOSIS — R42 Dizziness and giddiness: Secondary | ICD-10-CM | POA: Insufficient documentation

## 2021-01-08 DIAGNOSIS — R2681 Unsteadiness on feet: Secondary | ICD-10-CM | POA: Insufficient documentation

## 2021-01-08 NOTE — Therapy (Signed)
[facility] [address] [place], [place], [address] Phone: [phone]   Fax:  [phone]  Physical Therapy Treatment  Patient Details  Name: [name] MRN: [id number] Date of Birth: 1950/04/28 Referring Provider (PT): Isaac Bliss, Rayford Halsted, MD   Encounter Date: 01/08/2021   PT End of Session - 01/08/21 1528     Visit Number 7    Number of Visits 17    Date for PT Re-Evaluation 01/16/21    Authorization Type UHC    PT Start Time 1455   pt late   PT Stop Time 1527    PT Time Calculation (min) 32 min    Equipment Utilized During Treatment Gait belt    Activity Tolerance Patient tolerated treatment well    Behavior During Therapy [facility] for tasks assessed/performed             Past Medical History:  Diagnosis Date   Adjustment disorder with depressed mood 09/07/2007   Cataract    removed bilaterally   Chronic kidney disease    kidney stones   Cirrhosis ([place])    Colon polyps    Tubular Adenoma 2010   CONGESTIVE HEART FAILURE 12/09/2008   CORONARY ARTERY DISEASE 2006   HYPERLIPIDEMIA 08/09/2006   HYPERTENSION 08/09/2006   MYOCARDIAL INFARCTION, HX OF 07/07/2004   NEPHROLITHIASIS, HX OF 08/09/2006   OBESITY 07/19/2008   Sleep apnea    has a cpap - not using religiously    Past Surgical History:  Procedure Laterality Date   BIOPSY  11/18/2020   Procedure: BIOPSY;  Surgeon: Yetta Flock, MD;  Location: [facility];  Service: Gastroenterology;;   CARDIOVERSION N/A 12/14/2017   Procedure: CARDIOVERSION;  Surgeon: [name], MD;  Location: [place];  Service: Cardiovascular;  Laterality: N/A;   CARDIOVERSION N/A 10/01/2019   Procedure: CARDIOVERSION;  Surgeon: Buford Dresser, MD;  Location: [place];  Service: Cardiovascular;  Laterality: N/A;   COLONOSCOPY     COLONOSCOPY WITH PROPOFOL N/A 11/18/2020   Procedure: COLONOSCOPY WITH PROPOFOL;  Surgeon: Yetta Flock,  MD;  Location: [facility];  Service: Gastroenterology;  Laterality: N/A;   CORONARY ARTERY BYPASS GRAFT  2006   ESOPHAGOGASTRODUODENOSCOPY (EGD) WITH PROPOFOL N/A 11/18/2020   Procedure: ESOPHAGOGASTRODUODENOSCOPY (EGD) WITH PROPOFOL;  Surgeon: Yetta Flock, MD;  Location: [facility];  Service: Gastroenterology;  Laterality: N/A;   POLYPECTOMY     POLYPECTOMY  11/18/2020   Procedure: POLYPECTOMY;  Surgeon: Yetta Flock, MD;  Location: [facility];  Service: Gastroenterology;;    There were no vitals filed for this visit.   Subjective Assessment - 01/08/21 1455     Subjective Been feeling "up and down." Went to [facility] with his golf buddies where he had to hold onto his buddies when walking along the balcony but did fine when walking on the golf course and in restuarants.    Pertinent History CHF, CKD, CAD, HLD, HTN, CABG s/p MI 2006    Diagnostic tests none recent    Patient Stated Goals improve dizziness    Currently in Pain? No/denies                                    Balance Exercises - 01/08/21 0001       Balance Exercises: Standing   SLS Limitations    SLS Limitations 2x each LE SLS ring toss game  Other Standing Exercises turning to find cone behind, stepping backwards over cone, bending over to pick it up 10x each LE   more difficulty stepping with R LE   Other Standing Exercises Comments R/L step up/back on bosu with light 1 UE support and CGA 10x each   up to mod A required for last 3 reps on R LE               PT Education - 01/08/21 1527     Education Details edu on watching "Optokinetic Training: Driving on a [place] " for Data processing manager) Educated Patient    Methods Explanation;Demonstration;Tactile cues;Verbal cues;Handout    Comprehension Verbalized understanding              PT Short Term Goals - 11/27/20 1449       PT SHORT TERM GOAL #1   Title Patient to be independent  with initial HEP.    Time 3    Period Weeks    Status Achieved    Target Date 12/12/20               PT Long Term Goals - 11/25/20 1632       PT LONG TERM GOAL #1   Title Patient to be independent with advanced HEP.    Time 8    Period Weeks    Status On-going      PT LONG TERM GOAL #2   Title Patient to score at least 20/24 on DGI in order to decrease risk of falls.    Time 8    Period Weeks    Status On-going      PT LONG TERM GOAL #3   Title Patient to report 70% improvement in balance confidence with ambulation with/without LRAD.    Time 8    Period Weeks    Status On-going      PT LONG TERM GOAL #4   Title Patient to report 70% improvement in dizziness.    Time 8    Period Weeks    Status On-going      PT LONG TERM GOAL #5   Title Pt will ambulate over outdoor surfaces with LRAD while performing head turns to scan environment with good stability in order to indicate safe community mobility.    Time 8    Period Weeks    Status On-going                   Plan - 01/08/21 1528     Clinical Impression Statement Patient arrived to session with report of going to [facility] where he had to hold onto his friends when walking along a balcony but felt more secure when walking on the golf course and in restaurants. Also reports some increased aural fullness, thus encouraged patient to speak to MD about this. Educated patient on use of optokinetic driving video to improve tolerance for driving at high speeds. Worked on dynamic balance activities with CGA and intermittent UE support as needed. Patient demonstrated some hesitancy and report of fear of falling when standing on compliant surfaces. Required up to mod A for last 3 reps of step ups on bosu ball. Better performance demonstrates with SLS activities today. Patient tolerated session well and without complaints at end of session.    Comorbidities CHF, CKD, CAD, HLD, HTN, CABG s/p MI 2006    PT  Treatment/Interventions ADLs/Self Care Home Management;Canalith Repostioning;Cryotherapy;Moist Heat;DME Instruction;Neuromuscular re-education;Balance training;Therapeutic exercise;Therapeutic activities;Functional  mobility training;Stair training;Gait training;Patient/family education;Manual techniques;Energy conservation;Dry needling;Passive range of motion;Taping;Vestibular;Visual/perceptual remediation/compensation    PT Next Visit Plan progress balance training and habituation    Consulted and Agree with Plan of Care Patient             Patient will benefit from skilled therapeutic intervention in order to improve the following deficits and impairments:  Abnormal gait, Decreased coordination, Difficulty walking, Dizziness, Decreased balance, Impaired flexibility, Improper body mechanics, Postural dysfunction, Decreased strength  Visit Diagnosis: Dizziness and giddiness  Unsteadiness on feet  Other lack of coordination     Problem List Patient Active Problem List   Diagnosis Date Noted   Benign neoplasm of colon    Hepatic cirrhosis ([place])    Gastritis and gastroduodenitis    Esophageal varices without bleeding ([facility])    Atrial flutter ([facility])    Atypical atrial flutter (H[facility])    Aortic stenosis 10/11/2017   Murmur 06/26/2015   History of colon polyps 09/20/2013   Impaired glucose tolerance 09/20/2013   Exogenous obesity 09/20/2013   Thrombocytopenia, unspecified ([place]) 09/20/2013   Bruit 08/28/2013   Chronic combined systolic and diastolic heart failure ([place]) 12/09/2008   DYSPNEA 12/09/2008   OBESITY 07/19/2008   WEIGHT GAIN 07/15/2008   ADJUSTMENT DISORDER WITH DEPRESSED MOOD 09/07/2007   CELLULITIS, RIGHT LEG 07/04/2007   Dyslipidemia 08/09/2006   Essential hypertension 08/09/2006   MYOCARDIAL INFARCTION, HX OF 08/09/2006   Coronary atherosclerosis 08/09/2006   NEPHROLITHIASIS, HX OF 08/09/2006   Janene Harvey, PT, DPT 01/08/21 3:31 PM   [facility] [address] [place], [place], [address] Phone: [phone]   Fax:  [phone]  Name: DALAN COWGER MRN: [id number] Date of Birth: 04/04/1950

## 2021-01-09 ENCOUNTER — Other Ambulatory Visit: Payer: Self-pay | Admitting: Cardiology

## 2021-01-09 DIAGNOSIS — I5042 Chronic combined systolic (congestive) and diastolic (congestive) heart failure: Secondary | ICD-10-CM

## 2021-01-10 ENCOUNTER — Other Ambulatory Visit: Payer: Self-pay | Admitting: Internal Medicine

## 2021-01-14 ENCOUNTER — Encounter: Payer: Self-pay | Admitting: Physical Therapy

## 2021-01-14 ENCOUNTER — Ambulatory Visit: Payer: 59 | Admitting: Physical Therapy

## 2021-01-14 ENCOUNTER — Other Ambulatory Visit: Payer: Self-pay

## 2021-01-14 DIAGNOSIS — R42 Dizziness and giddiness: Secondary | ICD-10-CM | POA: Diagnosis not present

## 2021-01-14 DIAGNOSIS — R278 Other lack of coordination: Secondary | ICD-10-CM

## 2021-01-14 DIAGNOSIS — R2681 Unsteadiness on feet: Secondary | ICD-10-CM

## 2021-01-14 NOTE — Therapy (Signed)
[facility] [place], [place], [place], [address] Phone: [phone]   Fax:  [phone]  Physical Therapy Progress Note  Patient Details  Name: [name] MRN: [id number] Date of Birth: Apr 20, 1950 Referring Provider (PT): Isaac Bliss, Rayford Halsted, MD   Encounter Date: 01/14/2021   PT End of Session - 01/14/21 1634     Visit Number 8    Number of Visits 14    Date for PT Re-Evaluation 02/25/21    Authorization Type UHC    PT Start Time 1450    PT Stop Time 1529    PT Time Calculation (min) 39 min    Equipment Utilized During Treatment Gait belt    Activity Tolerance Patient tolerated treatment well    Behavior During Therapy [facility] for tasks assessed/performed             Past Medical History:  Diagnosis Date   Adjustment disorder with depressed mood 09/07/2007   Cataract    removed bilaterally   Chronic kidney disease    kidney stones   Cirrhosis ([place])    Colon polyps    Tubular Adenoma 2010   CONGESTIVE HEART FAILURE 12/09/2008   CORONARY ARTERY DISEASE 2006   HYPERLIPIDEMIA 08/09/2006   HYPERTENSION 08/09/2006   MYOCARDIAL INFARCTION, HX OF 07/07/2004   NEPHROLITHIASIS, HX OF 08/09/2006   OBESITY 07/19/2008   Sleep apnea    has a cpap - not using religiously    Past Surgical History:  Procedure Laterality Date   BIOPSY  11/18/2020   Procedure: BIOPSY;  Surgeon: Yetta Flock, MD;  Location: [facility];  Service: Gastroenterology;;   CARDIOVERSION N/A 12/14/2017   Procedure: CARDIOVERSION;  Surgeon: [name], MD;  Location: [place];  Service: Cardiovascular;  Laterality: N/A;   CARDIOVERSION N/A 10/01/2019   Procedure: CARDIOVERSION;  Surgeon: Buford Dresser, MD;  Location: [place];  Service: Cardiovascular;  Laterality: N/A;   COLONOSCOPY     COLONOSCOPY WITH PROPOFOL N/A 11/18/2020   Procedure: COLONOSCOPY WITH PROPOFOL;  Surgeon: Yetta Flock, MD;   [facility];  Service: Gastroenterology;  Laterality: N/A;   CORONARY ARTERY BYPASS GRAFT  2006   ESOPHAGOGASTRODUODENOSCOPY (EGD) WITH PROPOFOL N/A 11/18/2020   Procedure: ESOPHAGOGASTRODUODENOSCOPY (EGD) WITH PROPOFOL;  Surgeon: Yetta Flock, MD;  [facility];  Service: Gastroenterology;  Laterality: N/A;   POLYPECTOMY     POLYPECTOMY  11/18/2020   Procedure: POLYPECTOMY;  Surgeon: Yetta Flock, MD;  [facility];  Service: Gastroenterology;;    There were no vitals filed for this visit.   Subjective Assessment - 01/14/21 1450     Subjective Reports that he is doing well. Reports 60% improvement in balance confidence and 50% improvement in dizziness . Still does not feel comfortable walking across an empty parking lot d/t unsteadiness and dizziness. Dizziness still aggravated by stopping after a period of driving and looking away after looking through his readers.    Pertinent History CHF, CKD, CAD, HLD, HTN, CABG s/p MI 2006    Diagnostic tests none recent    Patient Stated Goals improve dizziness    Currently in Pain? No/denies                [facility] PT Assessment - 01/14/21 1456       Assessment   Medical Diagnosis Vertigo, Unsteady gait    Referring Provider (PT) Isaac Bliss, Rayford Halsted, MD    Onset Date/Surgical Date 11/22/18  Dynamic Gait Index   Level Surface Normal    Change in Gait Speed Mild Impairment    Gait with Horizontal Head Turns Mild Impairment    Gait with Vertical Head Turns Normal    Gait and Pivot Turn Normal    Step Over Obstacle Normal    Step Around Obstacles Normal    Steps Mild Impairment    Total Score 21                           [facility] Adult PT Treatment/Exercise - 01/14/21 0001       Ambulation/Gait   Ambulation Distance (Feet) 200 Feet    Assistive device Straight cane    Gait Pattern Decreased step length - right;Decreased step length - left;Step-to  pattern;Step-through pattern;Trunk flexed    Ambulation Surface Level;Indoor    Gait velocity decreased    Gait Comments gait training outside on grass and pavement with and without SPC while maintaining conversation and turning head; good stability throughout and cued patient to widen BOS; patient without SOB after gait training required sit break                     PT Education - 01/14/21 1530     Education Details verbal review of HEP and discussion on importance of HEP compliance- advised to decrease reliance on reaching reflex; administered new optokinetic driving video; discussion on patient's progress and remaining impairments; advised to speak to MD about SOB with exertion    Person(s) Educated Patient    Methods Explanation;Demonstration;Tactile cues;Verbal cues;Handout    Comprehension Verbalized understanding              PT Short Term Goals - 01/14/21 1641       PT SHORT TERM GOAL #1   Title Patient to be independent with initial HEP.    Time 3    Period Weeks    Status Achieved    Target Date 12/12/20               PT Long Term Goals - 01/14/21 1641       PT LONG TERM GOAL #1   Title Patient to be independent with advanced HEP.    Time 6    Period Weeks    Status Partially Met   met for current   Target Date 02/25/21      PT LONG TERM GOAL #2   Title Patient to score at least 20/24 on DGI in order to decrease risk of falls.    Time 8    Period Weeks    Status Achieved      PT LONG TERM GOAL #3   Title Patient to report 70% improvement in balance confidence with ambulation with/without LRAD.    Time 6    Period Weeks    Status On-going   60%   Target Date 02/25/21      PT LONG TERM GOAL #4   Title Patient to report 70% improvement in dizziness.    Time 6    Period Weeks    Status On-going   50%   Target Date 02/25/21      PT LONG TERM GOAL #5   Title Pt will ambulate over outdoor surfaces with LRAD while performing head turns  to scan environment with good stability in order to indicate safe community mobility.    Time 8    Period Weeks    Status  Achieved                   Plan - 01/14/21 1635     Clinical Impression Statement Patient arrived to session with report of 50% improvement in dizziness and 60% improvement in balance confidence since initial eval. Reports that he still does not feel comfortable walking across an empty parking lot d/t unsteadiness and dizziness. Dizziness still aggravated by stopping after a period of driving and looking away after looking through his readers. Patient scored 21/24 on DGI, indicating a decreased risk of falls. Still demonstrating some mild imbalance with head turns, stairs, and increasing gait speed. Patient performed gait training outside on grass and pavement with and without SPC while maintaining conversation and performing head turns. Good stability throughout and cued patient to widen BOS. Patient with some SOB after gait training which required a sit break to resolve. Patient noting that this SOB with exertion has been ongoing for 6 weeks- advised him to speak to his PCP or cardiologist about this. Provided edu at end of session involving consistency with HEP, continuing optokinetic training with new video, and discussion on POC. Patient is demonstrating good progress towards goals. Would benefit from continued skilled PT services 1x/week for 6 weeks to address remaining goals.    Comorbidities CHF, CKD, CAD, HLD, HTN, CABG s/p MI 2006    PT Frequency 1x / week    PT Duration 6 weeks    PT Treatment/Interventions ADLs/Self Care Home Management;Canalith Repostioning;Cryotherapy;Moist Heat;DME Instruction;Neuromuscular re-education;Balance training;Therapeutic exercise;Therapeutic activities;Functional mobility training;Stair training;Gait training;Patient/family education;Manual techniques;Energy conservation;Dry needling;Passive range of  motion;Taping;Vestibular;Visual/perceptual remediation/compensation    PT Next Visit Plan progress balance training and habituation, optokinetics    Consulted and Agree with Plan of Care Patient             Patient will benefit from skilled therapeutic intervention in order to improve the following deficits and impairments:  Abnormal gait, Decreased coordination, Difficulty walking, Dizziness, Decreased balance, Impaired flexibility, Improper body mechanics, Postural dysfunction, Decreased strength  Visit Diagnosis: Dizziness and giddiness  Unsteadiness on feet  Other lack of coordination     Problem List Patient Active Problem List   Diagnosis Date Noted   Benign neoplasm of colon    Hepatic cirrhosis ([place])    Gastritis and gastroduodenitis    Esophageal varices without bleeding ([facility])    Atrial flutter (HCC)    Atypical atrial flutter (HCC)    Aortic stenosis 10/11/2017   Murmur 06/26/2015   History of colon polyps 09/20/2013   Impaired glucose tolerance 09/20/2013   Exogenous obesity 09/20/2013   Thrombocytopenia, unspecified ([place]) 09/20/2013   Bruit 08/28/2013   Chronic combined systolic and diastolic heart failure ([place]) 12/09/2008   DYSPNEA 12/09/2008   OBESITY 07/19/2008   WEIGHT GAIN 07/15/2008   ADJUSTMENT DISORDER WITH DEPRESSED MOOD 09/07/2007   CELLULITIS, RIGHT LEG 07/04/2007   Dyslipidemia 08/09/2006   Essential hypertension 08/09/2006   MYOCARDIAL INFARCTION, HX OF 08/09/2006   Coronary atherosclerosis 08/09/2006   NEPHROLITHIASIS, HX OF 08/09/2006     Janene Harvey, PT, DPT 01/14/21 4:44 PM   [facility] [address], [place], [place], [address] Phone: [phone]   Fax:  [phone]  Name: [name] MRN: [id number] Date of Birth: 1950-11-07

## 2021-01-15 ENCOUNTER — Other Ambulatory Visit: Payer: 59

## 2021-01-15 DIAGNOSIS — K7460 Unspecified cirrhosis of liver: Secondary | ICD-10-CM

## 2021-01-19 ENCOUNTER — Ambulatory Visit: Payer: 59 | Admitting: Physical Therapy

## 2021-01-20 LAB — ANTI-MICROSOMAL ANTIBODY LIVER / KIDNEY: LKM1 Ab: <=20 U (ref <=20.0)

## 2021-01-20 LAB — IGG: IgG (Immunoglobin G), Serum: 2227 mg/dL — ABNORMAL HIGH (ref 600–1540)

## 2021-01-21 ENCOUNTER — Encounter: Payer: Self-pay | Admitting: Physical Therapy

## 2021-01-21 ENCOUNTER — Ambulatory Visit: Payer: 59 | Admitting: Physical Therapy

## 2021-01-21 ENCOUNTER — Other Ambulatory Visit: Payer: Self-pay

## 2021-01-21 DIAGNOSIS — R2681 Unsteadiness on feet: Secondary | ICD-10-CM

## 2021-01-21 DIAGNOSIS — R42 Dizziness and giddiness: Secondary | ICD-10-CM

## 2021-01-21 DIAGNOSIS — R278 Other lack of coordination: Secondary | ICD-10-CM

## 2021-01-21 NOTE — Therapy (Signed)
[facility] [address] [place], [place], [address] Phone: [phone]   Fax:  [phone]  Physical Therapy Treatment  Patient Details  Name: [name] MRN: [id number] Date of Birth: 06-13-1950 Referring Provider (PT): Isaac Bliss, Rayford Halsted, MD   Encounter Date: 01/21/2021   PT End of Session - 01/21/21 1535     Visit Number 9    Number of Visits 14    Date for PT Re-Evaluation 02/25/21    Authorization Type UHC    PT Start Time 1450    PT Stop Time 1530    PT Time Calculation (min) 40 min    Equipment Utilized During Treatment Gait belt    Activity Tolerance Patient tolerated treatment well    Behavior During Therapy [facility] for tasks assessed/performed             Past Medical History:  Diagnosis Date   Adjustment disorder with depressed mood 09/07/2007   Cataract    removed bilaterally   Chronic kidney disease    kidney stones   Cirrhosis ([place])    Colon polyps    Tubular Adenoma 2010   CONGESTIVE HEART FAILURE 12/09/2008   CORONARY ARTERY DISEASE 2006   HYPERLIPIDEMIA 08/09/2006   HYPERTENSION 08/09/2006   MYOCARDIAL INFARCTION, HX OF 07/07/2004   NEPHROLITHIASIS, HX OF 08/09/2006   OBESITY 07/19/2008   Sleep apnea    has a cpap - not using religiously    Past Surgical History:  Procedure Laterality Date   BIOPSY  11/18/2020   Procedure: BIOPSY;  Surgeon: Yetta Flock, MD;  Location: [facility];  Service: Gastroenterology;;   CARDIOVERSION N/A 12/14/2017   Procedure: CARDIOVERSION;  Surgeon: [name], MD;  Location: [place];  Service: Cardiovascular;  Laterality: N/A;   CARDIOVERSION N/A 10/01/2019   Procedure: CARDIOVERSION;  Surgeon: Buford Dresser, MD;  Location: [place];  Service: Cardiovascular;  Laterality: N/A;   COLONOSCOPY     COLONOSCOPY WITH PROPOFOL N/A 11/18/2020   Procedure: COLONOSCOPY WITH PROPOFOL;  Surgeon: Yetta Flock, MD;   Location: [facility];  Service: Gastroenterology;  Laterality: N/A;   CORONARY ARTERY BYPASS GRAFT  2006   ESOPHAGOGASTRODUODENOSCOPY (EGD) WITH PROPOFOL N/A 11/18/2020   Procedure: ESOPHAGOGASTRODUODENOSCOPY (EGD) WITH PROPOFOL;  Surgeon: Yetta Flock, MD;  Location: [facility];  Service: Gastroenterology;  Laterality: N/A;   POLYPECTOMY     POLYPECTOMY  11/18/2020   Procedure: POLYPECTOMY;  Surgeon: Yetta Flock, MD;  Location: [facility];  Service: Gastroenterology;;    There were no vitals filed for this visit.   Subjective Assessment - 01/21/21 1452     Subjective Reports that he forgot to watch the new driving video. Denies new falls.    Pertinent History CHF, CKD, CAD, HLD, HTN, CABG s/p MI 2006    Diagnostic tests none recent    Patient Stated Goals improve dizziness    Currently in Pain? No/denies                               [facility] Adult PT Treatment/Exercise - 01/21/21 0001       Neuro Re-ed    Neuro Re-ed Details  sitting on green pball with bounce 2x 30", VOR + perturbations 2x30"; march on foam + perturbations with optokinetic background   c/o 5- 6/10 dizziness            Vestibular Treatment/Exercise - 01/21/21 0001  X1 Viewing Horizontal   Foot Position standing feet wide on foam    Reps 10   2x10   Comments checkered optokinetic background, played at normal and 1/2 speed   c/o moderate-severe dizziness               Balance Exercises - 01/21/21 0001       Balance Exercises: Standing   Other Standing Exercises R/L toe tap on 1st, 2nd step, then back down standing on foam 10x each; stanidng on foam step down + retro step up 10x each   intermittent UE support d/t instability   Other Standing Exercises Comments R/L fwd/back stepping + head turns to targets 10x   more instability on R               PT Education - 01/21/21 1534     Education Details update to HEP-Access Code: [email or web address];  discussion on use of optokinetic videos for increased challenge with HEP; educated on max safety with HEP    Person(s) Educated Patient    Methods Explanation;Demonstration;Tactile cues;Verbal cues;Handout    Comprehension Verbalized understanding;Returned demonstration              PT Short Term Goals - 01/14/21 1641       PT SHORT TERM GOAL #1   Title Patient to be independent with initial HEP.    Time 3    Period Weeks    Status Achieved    Target Date 12/12/20               PT Long Term Goals - 01/14/21 1641       PT LONG TERM GOAL #1   Title Patient to be independent with advanced HEP.    Time 6    Period Weeks    Status Partially Met   met for current   Target Date 02/25/21      PT LONG TERM GOAL #2   Title Patient to score at least 20/24 on DGI in order to decrease risk of falls.    Time 8    Period Weeks    Status Achieved      PT LONG TERM GOAL #3   Title Patient to report 70% improvement in balance confidence with ambulation with/without LRAD.    Time 6    Period Weeks    Status On-going   60%   Target Date 02/25/21      PT LONG TERM GOAL #4   Title Patient to report 70% improvement in dizziness.    Time 6    Period Weeks    Status On-going   50%   Target Date 02/25/21      PT LONG TERM GOAL #5   Title Pt will ambulate over outdoor surfaces with LRAD while performing head turns to scan environment with good stability in order to indicate safe community mobility.    Time 8    Period Weeks    Status Achieved                   Plan - 01/21/21 1536     Clinical Impression Statement Patient without new complaints today. Session focused on progressing VOR with challenges including optokinetic videos and compliant surfaces. These tasks seemed to increase c/o dizziness. Dynamic balance activities were performed on foam, with patient requiring intermittent UE support to recover balance. Updated HEP with exercises that were safely performed  today. Patient reported understanding and without complaints at end of session.  Comorbidities CHF, CKD, CAD, HLD, HTN, CABG s/p MI 2006    PT Frequency 1x / week    PT Duration 6 weeks    PT Treatment/Interventions ADLs/Self Care Home Management;Canalith Repostioning;Cryotherapy;Moist Heat;DME Instruction;Neuromuscular re-education;Balance training;Therapeutic exercise;Therapeutic activities;Functional mobility training;Stair training;Gait training;Patient/family education;Manual techniques;Energy conservation;Dry needling;Passive range of motion;Taping;Vestibular;Visual/perceptual remediation/compensation    PT Next Visit Plan progress balance training and habituation, optokinetics    Consulted and Agree with Plan of Care Patient             Patient will benefit from skilled therapeutic intervention in order to improve the following deficits and impairments:  Abnormal gait, Decreased coordination, Difficulty walking, Dizziness, Decreased balance, Impaired flexibility, Improper body mechanics, Postural dysfunction, Decreased strength  Visit Diagnosis: Dizziness and giddiness  Unsteadiness on feet  Other lack of coordination     Problem List Patient Active Problem List   Diagnosis Date Noted   Benign neoplasm of colon    Hepatic cirrhosis ([place])    Gastritis and gastroduodenitis    Esophageal varices without bleeding ([place])    Atrial flutter ([facility])    Atypical atrial flutter ([place])    Aortic stenosis 10/11/2017   Murmur 06/26/2015   History of colon polyps 09/20/2013   Impaired glucose tolerance 09/20/2013   Exogenous obesity 09/20/2013   Thrombocytopenia, unspecified ([place]) 09/20/2013   Bruit 08/28/2013   Chronic combined systolic and diastolic heart failure ([place]) 12/09/2008   DYSPNEA 12/09/2008   OBESITY 07/19/2008   WEIGHT GAIN 07/15/2008   ADJUSTMENT DISORDER WITH DEPRESSED MOOD 09/07/2007   CELLULITIS, RIGHT LEG 07/04/2007   Dyslipidemia 08/09/2006   Essential  hypertension 08/09/2006   MYOCARDIAL INFARCTION, HX OF 08/09/2006   Coronary atherosclerosis 08/09/2006   NEPHROLITHIASIS, HX OF 08/09/2006    Janene Harvey, PT, DPT 01/21/21 3:39 PM   [facility] [address], [place], [place], [address] Phone: [phone]   Fax:  [phone]  Name: [name] MRN: [id number] Date of Birth: December 13, 1950

## 2021-01-22 ENCOUNTER — Other Ambulatory Visit: Payer: Self-pay | Admitting: Hematology and Oncology

## 2021-01-23 ENCOUNTER — Other Ambulatory Visit: Payer: Self-pay

## 2021-01-23 DIAGNOSIS — D696 Thrombocytopenia, unspecified: Secondary | ICD-10-CM

## 2021-01-23 DIAGNOSIS — K7460 Unspecified cirrhosis of liver: Secondary | ICD-10-CM

## 2021-01-28 ENCOUNTER — Ambulatory Visit: Payer: 59 | Admitting: Physical Therapy

## 2021-02-04 ENCOUNTER — Other Ambulatory Visit: Payer: Self-pay

## 2021-02-04 ENCOUNTER — Encounter: Payer: Self-pay | Admitting: Physical Therapy

## 2021-02-04 ENCOUNTER — Ambulatory Visit: Payer: 59 | Attending: Internal Medicine | Admitting: Physical Therapy

## 2021-02-04 DIAGNOSIS — R2681 Unsteadiness on feet: Secondary | ICD-10-CM | POA: Diagnosis present

## 2021-02-04 DIAGNOSIS — R278 Other lack of coordination: Secondary | ICD-10-CM | POA: Diagnosis present

## 2021-02-04 DIAGNOSIS — R42 Dizziness and giddiness: Secondary | ICD-10-CM | POA: Insufficient documentation

## 2021-02-04 NOTE — Therapy (Signed)
[facility] [address] [place], [place], [address] Phone: [phone]   Fax:  [phone]  Physical Therapy Treatment  Patient Details  Name: [name] MRN: [id number] Date of Birth: [birth year] Referring Provider (PT): Isaac Bliss, Rayford Halsted, MD   Encounter Date: 02/04/2021   PT End of Session - 02/04/21 1634     Visit Number 10    Number of Visits 14    Date for PT Re-Evaluation 02/25/21    Authorization Type UHC    PT Start Time 3329    PT Stop Time 1532    PT Time Calculation (min) 40 min    Equipment Utilized During Treatment Gait belt    Activity Tolerance Patient tolerated treatment well    Behavior During Therapy [facility] for tasks assessed/performed             Past Medical History:  Diagnosis Date   Adjustment disorder with depressed mood 09/07/2007   Cataract    removed bilaterally   Chronic kidney disease    kidney stones   Cirrhosis ([place])    Colon polyps    Tubular Adenoma 2010   CONGESTIVE HEART FAILURE 12/09/2008   CORONARY ARTERY DISEASE 2006   HYPERLIPIDEMIA 08/09/2006   HYPERTENSION 08/09/2006   MYOCARDIAL INFARCTION, HX OF 07/07/2004   NEPHROLITHIASIS, HX OF 08/09/2006   OBESITY 07/19/2008   Sleep apnea    has a cpap - not using religiously    Past Surgical History:  Procedure Laterality Date   BIOPSY  11/18/2020   Procedure: BIOPSY;  Surgeon: Yetta Flock, MD;  Location: [facility];  Service: Gastroenterology;;   CARDIOVERSION N/A 12/14/2017   Procedure: CARDIOVERSION;  Surgeon: [name], MD;  Location: [place];  Service: Cardiovascular;  Laterality: N/A;   CARDIOVERSION N/A 10/01/2019   Procedure: CARDIOVERSION;  Surgeon: Buford [facility]er, MD;  Location: [place];  Service: Cardiovascular;  Laterality: N/A;   COLONOSCOPY     COLONOSCOPY WITH PROPOFOL N/A 11/18/2020   Procedure: COLONOSCOPY WITH PROPOFOL;  Surgeon: Yetta Flock, MD;   [facility];  Service: Gastroenterology;  Laterality: N/A;   CORONARY ARTERY BYPASS GRAFT  2006   ESOPHAGOGASTRODUODENOSCOPY (EGD) WITH PROPOFOL N/A 11/18/2020   Procedure: ESOPHAGOGASTRODUODENOSCOPY (EGD) WITH PROPOFOL;  Surgeon: Yetta Flock, MD;  [facility];  Service: Gastroenterology;  Laterality: N/A;   POLYPECTOMY     POLYPECTOMY  11/18/2020   Procedure: POLYPECTOMY;  Surgeon: Yetta Flock, MD;  [facility];  Service: Gastroenterology;;    There were no vitals filed for this visit.   Subjective Assessment - 02/04/21 1453     Subjective Been feeling good. Has done some of his exercises. Denies recent falls.    Pertinent History CHF, CKD, CAD, HLD, HTN, CABG s/p MI 2006    Diagnostic tests none recent    Patient Stated Goals improve dizziness    Currently in Pain? No/denies                               [facility] Adult PT Treatment/Exercise - 02/04/21 0001       Neuro Re-ed    Neuro Re-ed Details  sitting on green pball in front of blinds with bounce 30", perturbations 30", VOR + perturbations 2x30"             Vestibular Treatment/Exercise - 02/04/21 0001       Vestibular Treatment/Exercise  Habituation Exercises Legrand Como Daroff   Number of Reps  3    Symptom Description  to each side EC c/o 7-8/10 dizziness; each side EO 0/10 at slower pace; EO at quick pace 4/10      X1 Viewing Horizontal   Foot Position standing on foam    Reps --   30" x3   Comments standing in front of blinds   mild sway               Balance Exercises - 02/04/21 0001       Balance Exercises: Standing   Marching Foam/compliant surface;Limitations;20 reps    Marching Limitations standing in front of blinds                PT Education - 02/04/21 1536     Education Details update to HEP; edu on benefits of fitness regimen after PT discharge    Person(s) Educated Patient    Methods  Explanation;Demonstration;Tactile cues;Verbal cues;Handout    Comprehension Verbalized understanding;Returned demonstration              PT Short Term Goals - 01/14/21 1641       PT SHORT TERM GOAL #1   Title Patient to be independent with initial HEP.    Time 3    Period Weeks    Status Achieved    Target Date 12/12/20               PT Long Term Goals - 01/14/21 1641       PT LONG TERM GOAL #1   Title Patient to be independent with advanced HEP.    Time 6    Period Weeks    Status Partially Met   met for current   Target Date 02/25/21      PT LONG TERM GOAL #2   Title Patient to score at least 20/24 on DGI in order to decrease risk of falls.    Time 8    Period Weeks    Status Achieved      PT LONG TERM GOAL #3   Title Patient to report 70% improvement in balance confidence with ambulation with/without LRAD.    Time 6    Period Weeks    Status On-going   60%   Target Date 02/25/21      PT LONG TERM GOAL #4   Title Patient to report 70% improvement in dizziness.    Time 6    Period Weeks    Status On-going   50%   Target Date 02/25/21      PT LONG TERM GOAL #5   Title Pt will ambulate over outdoor surfaces with LRAD while performing head turns to scan environment with good stability in order to indicate safe community mobility.    Time 8    Period Weeks    Status Achieved                   Plan - 02/04/21 1635     Clinical Impression Statement Patient arrived to session with report of no new issues and denied recent falls. Worked on optokinetic challenges on multiple compliant surfaces, however no evident dizziness. Overall with much improved tolerance of exercises for visual vertigo. Reassessed habituation which did bring on significant dizziness upon sitting up from sidelying. Performed with modifications to allow symptoms to decrease. Updated HEP with the version of this exercise which prompted most appropriate level of symptoms.  Patient  reported understanding of HEP and without complaints at end of session.    Comorbidities CHF, CKD, CAD, HLD, HTN, CABG s/p MI 2006    PT Frequency 1x / week    PT Duration 6 weeks    PT Treatment/Interventions ADLs/Self Care Home Management;Canalith Repostioning;Cryotherapy;Moist Heat;DME Instruction;Neuromuscular re-education;Balance training;Therapeutic exercise;Therapeutic activities;Functional mobility training;Stair training;Gait training;Patient/family education;Manual techniques;Energy conservation;Dry needling;Passive range of motion;Taping;Vestibular;Visual/perceptual remediation/compensation    PT Next Visit Plan progress balance training and habituation, optokinetics    Consulted and Agree with Plan of Care Patient             Patient will benefit from skilled therapeutic intervention in order to improve the following deficits and impairments:  Abnormal gait, Decreased coordination, Difficulty walking, Dizziness, Decreased balance, Impaired flexibility, Improper body mechanics, Postural dysfunction, Decreased strength  Visit Diagnosis: Dizziness and giddiness  Unsteadiness on feet  Other lack of coordination     Problem List Patient Active Problem List   Diagnosis Date Noted   Benign neoplasm of colon    Hepatic cirrhosis ([place])    Gastritis and gastroduodenitis    Esophageal varices without bleeding ([facility])    Atrial flutter ([facility])    Atypical atrial flutter ([place])    Aortic stenosis 10/11/2017   Murmur 06/26/2015   History of colon polyps 09/20/2013   Impaired glucose tolerance 09/20/2013   Exogenous obesity 09/20/2013   Thrombocytopenia, unspecified ([place]) 09/20/2013   Bruit 08/28/2013   Chronic combined systolic and diastolic heart failure ([place]) 12/09/2008   DYSPNEA 12/09/2008   OBESITY 07/19/2008   WEIGHT GAIN 07/15/2008   ADJUSTMENT DISORDER WITH DEPRESSED MOOD 09/07/2007   CELLULITIS, RIGHT LEG 07/04/2007   Dyslipidemia 08/09/2006   Essential hypertension  08/09/2006   MYOCARDIAL INFARCTION, HX OF 08/09/2006   Coronary atherosclerosis 08/09/2006   NEPHROLITHIASIS, HX OF 08/09/2006     Janene Harvey, PT, DPT 02/04/21 4:37 PM   [facility] [address] [place], [place], [address] Phone: [phone]   Fax:  [phone]  Name: [name] MRN: [id number] Date of Birth: 1950/07/02

## 2021-02-06 ENCOUNTER — Other Ambulatory Visit: Payer: Self-pay | Admitting: Internal Medicine

## 2021-02-09 ENCOUNTER — Other Ambulatory Visit: Payer: Self-pay | Admitting: Student

## 2021-02-09 NOTE — Telephone Encounter (Signed)
Prescription refill request for Eliquis received. Indication:Aflutter Last office visit:11/22 Scr:0.8 Age: 71 Weight:101.6 kg  Prescription refilled

## 2021-02-11 ENCOUNTER — Ambulatory Visit: Payer: 59 | Admitting: Physical Therapy

## 2021-02-11 ENCOUNTER — Other Ambulatory Visit: Payer: 59

## 2021-02-11 DIAGNOSIS — K7460 Unspecified cirrhosis of liver: Secondary | ICD-10-CM

## 2021-02-11 DIAGNOSIS — D696 Thrombocytopenia, unspecified: Secondary | ICD-10-CM

## 2021-02-12 ENCOUNTER — Other Ambulatory Visit: Payer: Self-pay

## 2021-02-12 ENCOUNTER — Encounter: Payer: Self-pay | Admitting: Internal Medicine

## 2021-02-12 ENCOUNTER — Ambulatory Visit (INDEPENDENT_AMBULATORY_CARE_PROVIDER_SITE_OTHER): Payer: 59 | Admitting: Internal Medicine

## 2021-02-12 ENCOUNTER — Ambulatory Visit (INDEPENDENT_AMBULATORY_CARE_PROVIDER_SITE_OTHER): Payer: 59

## 2021-02-12 VITALS — BP 140/60 | HR 56 | Temp 97.7°F | Ht 66.0 in | Wt 215.0 lb

## 2021-02-12 DIAGNOSIS — I5033 Acute on chronic diastolic (congestive) heart failure: Secondary | ICD-10-CM

## 2021-02-12 DIAGNOSIS — I10 Essential (primary) hypertension: Secondary | ICD-10-CM | POA: Diagnosis not present

## 2021-02-12 DIAGNOSIS — Z23 Encounter for immunization: Secondary | ICD-10-CM | POA: Diagnosis not present

## 2021-02-12 DIAGNOSIS — E66811 Obesity, class 1: Secondary | ICD-10-CM

## 2021-02-12 DIAGNOSIS — R7302 Impaired glucose tolerance (oral): Secondary | ICD-10-CM

## 2021-02-12 DIAGNOSIS — I484 Atypical atrial flutter: Secondary | ICD-10-CM | POA: Diagnosis not present

## 2021-02-12 DIAGNOSIS — E669 Obesity, unspecified: Secondary | ICD-10-CM

## 2021-02-12 DIAGNOSIS — K7469 Other cirrhosis of liver: Secondary | ICD-10-CM

## 2021-02-12 DIAGNOSIS — E785 Hyperlipidemia, unspecified: Secondary | ICD-10-CM

## 2021-02-12 DIAGNOSIS — Z0000 Encounter for general adult medical examination without abnormal findings: Secondary | ICD-10-CM | POA: Diagnosis not present

## 2021-02-12 LAB — CBC WITH DIFFERENTIAL/PLATELET
Basophils Absolute: 0 10*3/uL (ref 0.0–0.1)
Basophils Relative: 0.5 % (ref 0.0–3.0)
Eosinophils Absolute: 0.3 10*3/uL (ref 0.0–0.7)
Eosinophils Relative: 6.8 % — ABNORMAL HIGH (ref 0.0–5.0)
HCT: 40.4 % (ref 39.0–52.0)
Hemoglobin: 13.5 g/dL (ref 13.0–17.0)
Lymphocytes Relative: 32.7 % (ref 12.0–46.0)
Lymphs Abs: 1.5 10*3/uL (ref 0.7–4.0)
MCHC: 33.4 g/dL (ref 30.0–36.0)
MCV: 93.5 fl (ref 78.0–100.0)
Monocytes Absolute: 0.3 10*3/uL (ref 0.1–1.0)
Monocytes Relative: 5.5 % (ref 3.0–12.0)
Neutro Abs: 2.6 10*3/uL (ref 1.4–7.7)
Neutrophils Relative %: 54.5 % (ref 43.0–77.0)
Platelets: 96 10*3/uL — ABNORMAL LOW (ref 150.0–400.0)
RBC: 4.32 Mil/uL (ref 4.22–5.81)
RDW: 16.1 % — ABNORMAL HIGH (ref 11.5–15.5)
WBC: 4.7 10*3/uL (ref 4.0–10.5)

## 2021-02-12 LAB — COMPREHENSIVE METABOLIC PANEL
ALT: 17 U/L (ref 0–53)
AST: 29 U/L (ref 0–37)
Albumin: 3.4 g/dL — ABNORMAL LOW (ref 3.5–5.2)
Alkaline Phosphatase: 80 U/L (ref 39–117)
BUN: 15 mg/dL (ref 6–23)
CO2: 27 mEq/L (ref 19–32)
Calcium: 9.2 mg/dL (ref 8.4–10.5)
Chloride: 103 mEq/L (ref 96–112)
Creatinine, Ser: 0.81 mg/dL (ref 0.40–1.50)
GFR: 88.82 mL/min (ref >60.00)
Glucose, Bld: 104 mg/dL — ABNORMAL HIGH (ref 70–99)
Potassium: 4.1 mEq/L (ref 3.5–5.1)
Sodium: 138 mEq/L (ref 135–145)
Total Bilirubin: 2.5 mg/dL — ABNORMAL HIGH (ref 0.2–1.2)
Total Protein: 7.3 g/dL (ref 6.0–8.3)

## 2021-02-12 LAB — LIPID PANEL
Cholesterol: 118 mg/dL (ref 0–200)
HDL: 40.3 mg/dL (ref >39.00)
LDL Cholesterol: 65 mg/dL (ref 0–99)
NonHDL: 78.05
Total CHOL/HDL Ratio: 3
Triglycerides: 66 mg/dL (ref 0.0–149.0)
VLDL: 13.2 mg/dL (ref 0.0–40.0)

## 2021-02-12 LAB — VITAMIN B12: Vitamin B-12: 1076 pg/mL — ABNORMAL HIGH (ref 211–911)

## 2021-02-12 LAB — TSH: TSH: 2.51 u[IU]/mL (ref 0.35–5.50)

## 2021-02-12 LAB — VITAMIN D 25 HYDROXY (VIT D DEFICIENCY, FRACTURES): VITD: 33.24 ng/mL (ref 30.00–100.00)

## 2021-02-12 LAB — PSA: PSA: 0.26 ng/mL (ref 0.10–4.00)

## 2021-02-12 LAB — HEMOGLOBIN A1C: Hgb A1c MFr Bld: 5.9 % (ref 4.6–6.5)

## 2021-02-12 NOTE — Patient Instructions (Signed)
-  Nice seeing you today!!  -Lab work today; will notify you once results are available.  -Tdap vaccine today.  -Remember your COVID vaccine at the pharmacy.  -Please take your lasix as prescribed 3 tabs in the am and 2 tabls in the pm.  -Will send a message to Dr. Stanford Breed to see if he can get you in in the next couple weeks, if not come back here in 2 weeks.

## 2021-02-12 NOTE — Progress Notes (Signed)
Established Patient Office Visit     This visit occurred during the SARS-CoV-2 public health emergency.  Safety protocols were in place, including screening questions prior to the visit, additional usage of staff PPE, and extensive cleaning of exam room while observing appropriate contact time as indicated for disinfecting solutions.    CC/Reason for Visit: Annual preventive exam, dyspnea on exertion  HPI: [name] is a 71 y.o. male who is coming in today for the above mentioned reasons. Past Medical History is significant for: aortic valve stenosis followed by Dr. Stanford Breed, atrial fibrillation status post DCCV on Eliquis and Toprol, obstructive sleep apnea on nightly CPAP, hypertension, hyperlipidemia, morbid obesity.  He has been found to have cirrhosis secondary to [facility] after work-up with GI.  He has been complaining of vertigo that is improved after vestibular rehab, MRI was negative other than some white matter disease and he is scheduled to see neurology next month.  He has routine eye and dental care.  He is overdue for Tdap and COVID booster, he states he has had his shingles vaccine although I do not have this on file, he had a colonoscopy in 2022.  He has been experiencing a pattern of increased dyspnea on exertion.  He has noted to have grade 2 diastolic dysfunction and severe aortic valve stenosis.  He is prescribed Lasix 20 mg that he is supposed to take 3 tablets in the morning and 2 tablets at nighttime, however he does not take it routinely due to concerns of having to take frequent bathroom breaks.  His cardiologist is Dr. Stanford Breed.   Past Medical/Surgical History: Past Medical History:  Diagnosis Date   Adjustment disorder with depressed mood 09/07/2007   Cataract    removed bilaterally   Chronic kidney disease    kidney stones   Cirrhosis ([place])    Colon polyps    Tubular Adenoma 2010   CONGESTIVE HEART FAILURE 12/09/2008   CORONARY ARTERY DISEASE 2006    HYPERLIPIDEMIA 08/09/2006   HYPERTENSION 08/09/2006   MYOCARDIAL INFARCTION, HX OF 07/07/2004   NEPHROLITHIASIS, HX OF 08/09/2006   OBESITY 07/19/2008   Sleep apnea    has a cpap - not using religiously    Past Surgical History:  Procedure Laterality Date   BIOPSY  11/18/2020   Procedure: BIOPSY;  Surgeon: Yetta Flock, MD;  Location: [facility];  Service: Gastroenterology;;   CARDIOVERSION N/A 12/14/2017   Procedure: CARDIOVERSION;  Surgeon: [name], MD;  Location: [place];  Service: Cardiovascular;  Laterality: N/A;   CARDIOVERSION N/A 10/01/2019   Procedure: CARDIOVERSION;  Surgeon: Buford Dresser, MD;  Location: [place];  Service: Cardiovascular;  Laterality: N/A;   COLONOSCOPY     COLONOSCOPY WITH PROPOFOL N/A 11/18/2020   Procedure: COLONOSCOPY WITH PROPOFOL;  Surgeon: Yetta Flock, MD;  Location: [facility];  Service: Gastroenterology;  Laterality: N/A;   CORONARY ARTERY BYPASS GRAFT  2006   ESOPHAGOGASTRODUODENOSCOPY (EGD) WITH PROPOFOL N/A 11/18/2020   Procedure: ESOPHAGOGASTRODUODENOSCOPY (EGD) WITH PROPOFOL;  Surgeon: Yetta Flock, MD;  Location: [facility];  Service: Gastroenterology;  Laterality: N/A;   POLYPECTOMY     POLYPECTOMY  11/18/2020   Procedure: POLYPECTOMY;  Surgeon: Yetta Flock, MD;  Location: [facility];  Service: Gastroenterology;;    Social History:  reports that he has never smoked. He has never used smokeless tobacco. He reports current alcohol use of about 1.0 standard drink per week. He reports that he does not use drugs.  Allergies:  No Known Allergies  Family History:  Family History  Problem Relation Age of Onset   Hypertension Mother    Hyperlipidemia Mother    Heart disease Father    Ulcers Father    Bipolar disorder Brother    Colon cancer Neg Hx    Colon polyps Neg Hx    Esophageal cancer Neg Hx    Rectal cancer Neg Hx    Stomach cancer Neg Hx      Current  Outpatient Medications:    acetaminophen (TYLENOL) 500 MG tablet, Take 1,000 mg by mouth daily as needed for moderate pain or headache., Disp: , Rfl:    atorvastatin (LIPITOR) 80 MG tablet, TAKE 1 TABLET BY MOUTH EVERY DAY, Disp: 30 tablet, Rfl: 1   carvedilol (COREG) 3.125 MG tablet, Take 1 tablet (3.125 mg total) by mouth 2 (two) times daily with a meal., Disp: 60 tablet, Rfl: 2   [employer] VITAMIN B12 1000 MCG tablet, TAKE 1 TABLET BY MOUTH EVERY DAY, Disp: 180 tablet, Rfl: 1   ELIQUIS 5 MG TABS tablet, TAKE 1 TABLET BY MOUTH TWICE A DAY, Disp: 60 tablet, Rfl: 5   furosemide (LASIX) 20 MG tablet, TAKE 3 TABLETS (60MG ) IN THE MORNING AND 2 TABLETS (40MG ) IN THE EVENING., Disp: 80 tablet, Rfl: 2   hydrocortisone 2.5 % cream, Apply 1 application topically daily as needed (irritation)., Disp: , Rfl:    KLOR-CON M20 20 MEQ tablet, TAKE 1 TABLET BY MOUTH EVERY DAY, Disp: 30 tablet, Rfl: 11   tamsulosin (FLOMAX) 0.4 MG CAPS capsule, TAKE ONE CAPSULE BY MOUTH EVERY DAY AFTER SUPPER, Disp: 30 capsule, Rfl: 1  Review of Systems:  Constitutional: Denies fever, chills, diaphoresis, appetite change and fatigue.  HEENT: Denies photophobia, eye pain, redness, hearing loss, ear pain, congestion, sore throat, rhinorrhea, sneezing, mouth sores, trouble swallowing, neck pain, neck stiffness and tinnitus.   Respiratory: Denies  cough, chest tightness,  and wheezing.   Cardiovascular: Denies chest pain, palpitations and leg swelling.  Gastrointestinal: Denies nausea, vomiting, abdominal pain, diarrhea, constipation, blood in stool and abdominal distention.  Genitourinary: Denies dysuria, urgency, frequency, hematuria, flank pain and difficulty urinating.  Endocrine: Denies: hot or cold intolerance, sweats, changes in hair or nails, polyuria, polydipsia. Musculoskeletal: Denies myalgias, back pain, joint swelling, arthralgias and gait problem.  Skin: Denies pallor, rash and wound.  Neurological: Denies dizziness,  seizures, syncope, weakness, light-headedness, numbness and headaches.  Hematological: Denies adenopathy. Easy bruising, personal or family bleeding history  Psychiatric/Behavioral: Denies suicidal ideation, mood changes, confusion, nervousness, sleep disturbance and agitation    Physical Exam: Vitals:   02/12/21 1357  BP: 140/60  Pulse: (!) 56  Temp: 97.7 F (36.5 C)  TempSrc: Oral  SpO2: 90%  Weight: 215 lb (97.5 kg)  Height: 5\' 6"  (1.676 m)    Body mass index is 34.7 kg/m.   Constitutional: NAD, calm, comfortable, obese Eyes: PERRL, lids and conjunctivae normal, wears corrective lenses ENMT: Mucous membranes are moist. Posterior pharynx clear of any exudate or lesions. Normal dentition. Tympanic membrane is pearly white, no erythema or bulging. Neck: normal, supple, no masses, no thyromegaly Respiratory: Bilateral crackles, more prominent at the mid lung fields normal respiratory effort. No accessory muscle use.  Cardiovascular: Rhythm is irregular, strong systolic murmur.  3+ pitting lower extremity edema.   No carotid bruits.  Abdomen: no tenderness, no masses palpated. No hepatosplenomegaly. Bowel sounds positive.  Musculoskeletal: no clubbing / cyanosis. No joint deformity upper and lower extremities. Good ROM, no contractures.  Normal muscle tone.  Skin: no rashes, lesions, ulcers. No induration Neurologic: CN 2-12 grossly intact. Sensation intact, DTR normal. Strength 5/5 in all 4.  Psychiatric: Normal judgment and insight. Alert and oriented x 3. Normal mood.    Impression and Plan:  Encounter for preventive health examination  -Recommend routine eye and dental care. -Immunizations: Tdap today, due for COVID booster -Healthy lifestyle discussed in detail. -Labs to be updated today. -Colon cancer screening: 11/2020 -Breast cancer screening: Not applicable -Cervical cancer screening: Not applicable -Lung cancer screening: Not applicable -Prostate cancer  screening: PSA today -DEXA: Not applicable  Need for Tdap vaccination -Tdap administered today.  Atypical atrial flutter ([place]) -Followed by cardiology.  Essential hypertension  - Plan: CBC with Differential/Platelet, Comprehensive metabolic panel -Blood pressure slightly above goal, suspect will improve once he starts taking furosemide.  Impaired glucose tolerance  - Plan: Hemoglobin A1c  Dyslipidemia  - Plan: Lipid panel  Other cirrhosis of liver ([place]) -Followed by GI.  Acute on chronic heart failure with preserved ejection fraction ([place])  - Plan: DG Chest 2 View -He will take Lasix as prescribed, have sent a message to cardiology to see if they can see him in the next couple weeks, if not we will have him return here for follow-up.  Obesity (BMI 30.0-34.9) -Discussed healthy lifestyle, including increased physical activity and better food choices to promote weight loss.     Patient Instructions  -Nice seeing you today!!  -Lab work today; will notify you once results are available.  -Tdap vaccine today.  -Remember your COVID vaccine at the pharmacy.  -Please take your lasix as prescribed 3 tabs in the am and 2 tabls in the pm.  -Will send a message to Dr. Stanford Breed to see if he can get you in in the next couple weeks, if not come back here in 2 weeks.     Lelon Frohlich, MD [facility]

## 2021-02-14 LAB — PROTEIN ELECTROPHORESIS, SERUM
Albumin ELP: 3.2 g/dL — ABNORMAL LOW (ref 3.8–4.8)
Alpha 1: 0.3 g/dL (ref 0.2–0.3)
Alpha 2: 0.6 g/dL (ref 0.5–0.9)
Beta 2: 0.6 g/dL — ABNORMAL HIGH (ref 0.2–0.5)
Beta Globulin: 0.5 g/dL (ref 0.4–0.6)
Gamma Globulin: 2 g/dL — ABNORMAL HIGH (ref 0.8–1.7)
Total Protein: 7.1 g/dL (ref 6.1–8.1)

## 2021-02-14 LAB — KAPPA/LAMBDA LIGHT CHAINS
Kappa free light chain: 61 mg/L — ABNORMAL HIGH (ref 3.3–19.4)
Kappa:Lambda Ratio: 1.39 (ref 0.26–1.65)
Lambda Free Lght Chn: 43.9 mg/L — ABNORMAL HIGH (ref 5.7–26.3)

## 2021-02-16 ENCOUNTER — Other Ambulatory Visit: Payer: Self-pay | Admitting: Internal Medicine

## 2021-02-16 ENCOUNTER — Encounter: Payer: Self-pay | Admitting: Internal Medicine

## 2021-02-16 DIAGNOSIS — J189 Pneumonia, unspecified organism: Secondary | ICD-10-CM | POA: Insufficient documentation

## 2021-02-16 HISTORY — DX: Pneumonia, unspecified organism: J18.9

## 2021-02-16 MED ORDER — AMOXICILLIN-POT CLAVULANATE 875-125 MG PO TABS: 1.0000 | ORAL_TABLET | Freq: Two times a day (BID) | ORAL | 0 refills | Status: AC

## 2021-02-17 NOTE — Progress Notes (Signed)
HPI:FU CAD, AS and atrial flutter/fibrillation; history of coronary artery disease, status post coronary artery bypass graft surgery performed in June 2006. Patient had a LIMA to the LAD, saphenous vein graft to the diagonal, saphenous vein graft to the acute marginal and saphenous vein graft to the PDA. Carotid Dopplers August 2015 showed no significant stenosis. Nuclear study August 2015 showed ejection fraction 55% but no ischemia or infarction. Possible mild decrease in systolic blood pressure at peak exercise. Abdominal ultrasound June 2017 showed no aneurysm. Patient also with h/o atrial fibrillation/atrial flutter. Monitor December 2021 showed sinus rhythm with rare PAC and PVC.  Abdominal ultrasound June 2022 showed no aneurysm.  EGD October 2022 showed 1 moderate varix.  Echocardiogram repeated November 2022 and showed normal LV function, mild left ventricular enlargement, grade 2 diastolic dysfunction, mild left atrial enlargement, mild mitral regurgitation, moderate tricuspid regurgitation, moderate aortic stenosis with mean gradient 33 mmHg; DI 0.31.  Abdominal ultrasound December 2022 showed changes of cirrhosis.  Patient seen recently by primary care and felt to have excess volume.  He was not taking his Lasix as directed.  Chest x-ray January 2023 showed possible developing fibrosis versus potentially atypical infection.  Since last seen he has dyspnea with climbing 2 flights of stairs but not with routine activities.  No orthopnea, PND, chest pain or syncope.  He has chronic pedal edema which is essentially unchanged.  Current Outpatient Medications  Medication Sig Dispense Refill   acetaminophen (TYLENOL) 500 MG tablet Take 1,000 mg by mouth daily as needed for moderate pain or headache.     amoxicillin-clavulanate (AUGMENTIN) 875-125 MG tablet Take 1 tablet by mouth 2 (two) times daily for 7 days. 14 tablet 0   atorvastatin (LIPITOR) 80 MG tablet TAKE 1 TABLET BY MOUTH EVERY DAY  30 tablet 1   carvedilol (COREG) 3.125 MG tablet Take 1 tablet (3.125 mg total) by mouth 2 (two) times daily with a meal. 60 tablet 2   CVS VITAMIN B12 1000 MCG tablet TAKE 1 TABLET BY MOUTH EVERY DAY 180 tablet 1   ELIQUIS 5 MG TABS tablet TAKE 1 TABLET BY MOUTH TWICE A DAY 60 tablet 5   furosemide (LASIX) 20 MG tablet TAKE 3 TABLETS (60MG ) IN THE MORNING AND 2 TABLETS (40MG ) IN THE EVENING. 80 tablet 2   hydrocortisone 2.5 % cream Apply 1 application topically daily as needed (irritation).     KLOR-CON M20 20 MEQ tablet TAKE 1 TABLET BY MOUTH EVERY DAY 30 tablet 11   tamsulosin (FLOMAX) 0.4 MG CAPS capsule TAKE ONE CAPSULE BY MOUTH EVERY DAY AFTER SUPPER 30 capsule 1   No current facility-administered medications for this visit.     Past Medical History:  Diagnosis Date   Adjustment disorder with depressed mood 09/07/2007   Cataract    removed bilaterally   Chronic kidney disease    kidney stones   Cirrhosis ([place])    Colon polyps    Tubular Adenoma 2010   CONGESTIVE HEART FAILURE 12/09/2008   CORONARY ARTERY DISEASE 2006   HYPERLIPIDEMIA 08/09/2006   HYPERTENSION 08/09/2006   MYOCARDIAL INFARCTION, HX OF 07/07/2004   NEPHROLITHIASIS, HX OF 08/09/2006   OBESITY 07/19/2008   Sleep apnea    has a cpap - not using religiously    Past Surgical History:  Procedure Laterality Date   BIOPSY  11/18/2020   Procedure: BIOPSY;  Surgeon: Yetta Flock, MD;  Location: [facility];  Service: Gastroenterology;;   CARDIOVERSION  N/A 12/14/2017   Procedure: CARDIOVERSION;  Surgeon: [name], MD;  Location: [facility];  Service: Cardiovascular;  Laterality: N/A;   CARDIOVERSION N/A 10/01/2019   Procedure: CARDIOVERSION;  Surgeon: Buford Dresser, MD;  Location: [facility];  Service: Cardiovascular;  Laterality: N/A;   COLONOSCOPY     COLONOSCOPY WITH PROPOFOL N/A 11/18/2020   Procedure: COLONOSCOPY WITH PROPOFOL;  Surgeon: Yetta Flock, MD;  Location: [facility];  Service: Gastroenterology;  Laterality: N/A;   CORONARY ARTERY BYPASS GRAFT  2006   ESOPHAGOGASTRODUODENOSCOPY (EGD) WITH PROPOFOL N/A 11/18/2020   Procedure: ESOPHAGOGASTRODUODENOSCOPY (EGD) WITH PROPOFOL;  Surgeon: Yetta Flock, MD;  Location: [facility];  Service: Gastroenterology;  Laterality: N/A;   POLYPECTOMY     POLYPECTOMY  11/18/2020   Procedure: POLYPECTOMY;  Surgeon: Yetta Flock, MD;  Location: [facility];  Service: Gastroenterology;;    Social History   Socioeconomic History   Marital status: Single    Spouse name: Not on file   Number of children: 0   Years of education: Not on file   Highest education level: Not on file  Occupational History   Occupation: [employer], jewellery  Tobacco Use   Smoking status: Never   Smokeless tobacco: Never  Vaping Use   Vaping Use: Never used  Substance and Sexual Activity   Alcohol use: Yes    Alcohol/week: 1.0 standard drink    Types: 1 Cans of beer per week    Comment: occasional   Drug use: No   Sexual activity: Not on file  Other Topics Concern   Not on file  Social History Narrative   Not on file   Social Determinants of Health   Financial Resource Strain: Not on file  Food Insecurity: Not on file  Transportation Needs: Not on file  Physical Activity: Not on file  Stress: Not on file  Social Connections: Not on file  Intimate Partner Violence: Not on file    Family History  Problem Relation Age of Onset   Hypertension Mother    Hyperlipidemia Mother    Heart disease Father    Ulcers Father    Bipolar disorder Brother    Colon cancer Neg Hx    Colon polyps Neg Hx    Esophageal cancer Neg Hx    Rectal cancer Neg Hx    Stomach cancer Neg Hx     ROS: no fevers or chills, productive cough, hemoptysis, dysphasia, odynophagia, melena, hematochezia, dysuria, hematuria, rash, seizure activity, orthopnea, PND, pedal edema, claudication. Remaining systems are  negative.  Physical Exam: Well-developed well-nourished in no acute distress.  Skin is warm and dry.  HEENT is normal.  Neck is supple.  Chest is clear to auscultation with normal expansion.  Cardiovascular exam is regular rate and rhythm.  3/6 systolic murmur left sternal border, S2 is diminished. Abdominal exam nontender or distended. No masses palpated. Extremities show trace to 1+ edema. neuro grossly intact  ECG-normal sinus rhythm at a rate of 65, first-degree AV block, left anterior fascicular block, right bundle branch block, cannot rule out septal infarct, left ventricular hypertrophy.  Personally reviewed  A/P  1 paroxysmal atrial fibrillation-continue apixaban at present dose.  If he has more frequent episodes in the future we will consider addition of Tikosyn.  2 aortic stenosis-moderate on most recent echo.  Plan follow-up echocardiogram November 2023 or sooner if necessary.  He understands he will likely require TAVR in the future.  3 coronary artery disease-no chest pain.  Continue  statin.  No aspirin given need for apixaban.  4 chronic combined systolic/diastolic congestive heart failure-he was volume overloaded recently which has improved with compliance with recommended dose of Lasix.  5 hyperlipidemia-continue statin.  6 hypertension-blood pressure controlled.  Continue present medications and follow.  7 history of cirrhosis-moderate varix on recent EGD.  Kirk Ruths, MD

## 2021-02-18 ENCOUNTER — Encounter: Payer: Self-pay | Admitting: Cardiology

## 2021-02-18 ENCOUNTER — Encounter: Payer: Self-pay | Admitting: Physical Therapy

## 2021-02-18 ENCOUNTER — Other Ambulatory Visit: Payer: Self-pay

## 2021-02-18 ENCOUNTER — Ambulatory Visit: Payer: 59 | Admitting: Cardiology

## 2021-02-18 ENCOUNTER — Ambulatory Visit: Payer: 59 | Admitting: Physical Therapy

## 2021-02-18 ENCOUNTER — Encounter: Payer: Self-pay | Admitting: Gastroenterology

## 2021-02-18 VITALS — BP 120/60 | HR 65 | Ht 66.0 in | Wt 214.4 lb

## 2021-02-18 DIAGNOSIS — I5032 Chronic diastolic (congestive) heart failure: Secondary | ICD-10-CM

## 2021-02-18 DIAGNOSIS — I350 Nonrheumatic aortic (valve) stenosis: Secondary | ICD-10-CM | POA: Diagnosis not present

## 2021-02-18 DIAGNOSIS — I480 Paroxysmal atrial fibrillation: Secondary | ICD-10-CM | POA: Diagnosis not present

## 2021-02-18 DIAGNOSIS — I10 Essential (primary) hypertension: Secondary | ICD-10-CM

## 2021-02-18 DIAGNOSIS — R278 Other lack of coordination: Secondary | ICD-10-CM

## 2021-02-18 DIAGNOSIS — I2510 Atherosclerotic heart disease of native coronary artery without angina pectoris: Secondary | ICD-10-CM | POA: Diagnosis not present

## 2021-02-18 DIAGNOSIS — R2681 Unsteadiness on feet: Secondary | ICD-10-CM

## 2021-02-18 DIAGNOSIS — R42 Dizziness and giddiness: Secondary | ICD-10-CM

## 2021-02-18 DIAGNOSIS — E785 Hyperlipidemia, unspecified: Secondary | ICD-10-CM

## 2021-02-18 NOTE — Patient Instructions (Signed)
°  Follow-Up: At [facility], you and your health needs are our priority.  As part of our continuing mission to provide you with exceptional heart care, we have created designated Provider Care Teams.  These Care Teams include your primary Cardiologist (physician) and Advanced Practice Providers (APPs -  Physician Assistants and Nurse Practitioners) who all work together to provide you with the care you need, when you need it.  We recommend signing up for the patient portal called "MyChart".  Sign up information is provided on this After Visit Summary.  MyChart is used to connect with patients for Virtual Visits (Telemedicine).  Patients are able to view lab/test results, encounter notes, upcoming appointments, etc.  Non-urgent messages can be sent to your provider as well.   To learn more about what you can do with MyChart, go to [email or web address].    Your next appointment:   4 month(s)  The format for your next appointment:   In Person  Provider:   Kirk Ruths, MD

## 2021-02-18 NOTE — Therapy (Signed)
[facility] [address] [place], [place], [address] Phone: [phone]   Fax:  [phone]  Physical Therapy Discharge Summary  Patient Details  Name: [name] MRN: [id number] Date of Birth: 31-Oct-1950 Referring Provider (PT): Isaac Bliss, Rayford Halsted, MD   Encounter Date: 02/18/2021   PT End of Session - 02/18/21 1529     Visit Number 11    Number of Visits 14    Date for PT Re-Evaluation 02/25/21    Authorization Type U[employer]    PT Start Time 1448    PT Stop Time 1523    PT Time Calculation (min) 35 min    Equipment Utilized During Treatment Gait belt    Activity Tolerance Patient tolerated treatment well    Behavior During Therapy [facility] for tasks assessed/performed             Past Medical History:  Diagnosis Date   Adjustment disorder with depressed mood 09/07/2007   Cataract    removed bilaterally   Chronic kidney disease    kidney stones   Cirrhosis ([place])    Colon polyps    Tubular Adenoma 2010   CONGESTIVE HEART FAILURE 12/09/2008   CORONARY ARTERY DISEASE 2006   HYPERLIPIDEMIA 08/09/2006   HYPERTENSION 08/09/2006   MYOCARDIAL INFARCTION, HX OF 07/07/2004   NEPHROLITHIASIS, HX OF 08/09/2006   OBESITY 07/19/2008   Sleep apnea    has a cpap - not using religiously    Past Surgical History:  Procedure Laterality Date   BIOPSY  11/18/2020   Procedure: BIOPSY;  Surgeon: Yetta Flock, MD;  Location: [facility];  Service: Gastroenterology;;   CARDIOVERSION N/A 12/14/2017   Procedure: CARDIOVERSION;  Surgeon: [name], MD;  Location: [facility] [place];  Service: Cardiovascular;  Laterality: N/A;   CARDIOVERSION N/A 10/01/2019   Procedure: CARDIOVERSION;  Surgeon: Buford Dresser, MD;  Location: [place];  Service: Cardiovascular;  Laterality: N/A;   COLONOSCOPY     COLONOSCOPY WITH PROPOFOL N/A 11/18/2020   Procedure: COLONOSCOPY WITH PROPOFOL;  Surgeon: Yetta Flock,  MD;  Location: [facility];  Service: Gastroenterology;  Laterality: N/A;   CORONARY ARTERY BYPASS GRAFT  2006   ESOPHAGOGASTRODUODENOSCOPY (EGD) WITH PROPOFOL N/A 11/18/2020   Procedure: ESOPHAGOGASTRODUODENOSCOPY (EGD) WITH PROPOFOL;  Surgeon: Yetta Flock, MD;  [facility]: [facility];  Service: Gastroenterology;  Laterality: N/A;   POLYPECTOMY     POLYPECTOMY  11/18/2020   Procedure: POLYPECTOMY;  Surgeon: Yetta Flock, MD;  [facility];  Service: Gastroenterology;;    There were no vitals filed for this visit.   Subjective Assessment - 02/18/21 1449     Subjective Had a physical last week. Chest x-ray was done; they recommended he see his Cardiologist who he saw today. Reports that he hasn't been taking his fluid pills as he should. Swelling in his legs and SOB has resolved since he got back on track. Denies falls. Reports that he did some shopping today- felt uneasy going up/down the escalator and walking in the center of the aisles. Doing HEP but not consistently- denies questions. Reports 75-80% improvement in balance confidence since initial session.    Pertinent History CHF, CKD, CAD, HLD, HTN, CABG s/p MI 2006    Diagnostic tests none recent    Patient Stated Goals improve dizziness    Currently in Pain? No/denies                [facility] PT Assessment - 02/18/21 1501  Assessment   Medical Diagnosis Vertigo, Unsteady gait    Referring Provider (PT) Isaac Bliss, Rayford Halsted, MD    Onset Date/Surgical Date 11/22/18                           [facility] Adult PT Treatment/Exercise - 02/18/21 0001       Ambulation/Gait   Ambulation Distance (Feet) 230 Feet    Assistive device Straight cane    Gait Pattern Decreased step length - right;Decreased step length - left;Step-to pattern;Step-through pattern;Trunk flexed    Ambulation Surface Level;Unlevel;Indoor;Outdoor    Gait velocity slightly decreased    Gait Comments gait  training without AD on grass and pavement while maintaining conversation and performing head turns; no instability or dizziness      Neuro Re-ed    Neuro Re-ed Details  tandem walk along counter- good stability and no LOB                     PT Education - 02/18/21 1528     Education Details update/consolidation of HEP-Access Code: [email or web address]; discussion on patient's current progress and remaining uneasiness/instability; edu on continued HEP compliance and general fitness regimen    Person(s) Educated Patient    Methods Explanation;Demonstration;Tactile cues;Verbal cues;Handout    Comprehension Verbalized understanding;Returned demonstration              PT Short Term Goals - 02/18/21 1530       PT SHORT TERM GOAL #1   Title Patient to be independent with initial HEP.    Time 3    Period Weeks    Status Achieved    Target Date 12/12/20               PT Long Term Goals - 02/18/21 1530       PT LONG TERM GOAL #1   Title Patient to be independent with advanced HEP.    Time 6    Period Weeks    Status Achieved    Target Date 02/25/21      PT LONG TERM GOAL #2   Title Patient to score at least 20/24 on DGI in order to decrease risk of falls.    Time 8    Period Weeks    Status Achieved      PT LONG TERM GOAL #3   Title Patient to report 70% improvement in balance confidence with ambulation with/without LRAD.    Time 6    Period Weeks    Status Achieved   75-80%   Target Date 02/25/21      PT LONG TERM GOAL #4   Title Patient to report 70% improvement in dizziness.    Time 6    Period Weeks    Status Achieved   reports no dizziness day to day   Target Date 02/25/21      PT LONG TERM GOAL #5   Title Pt will ambulate over outdoor surfaces with LRAD while performing head turns to scan environment with good stability in order to indicate safe community mobility.    Time 8    Period Weeks    Status Achieved                   Plan -  02/18/21 1530     Clinical Impression Statement Patient arrived to session with report of improvement in LE swelling and SOB since last session, as he has been more compliant  with his medications. Denies recent falls however still noting uneasiness on escalator and with walking in the center of the aisles. Overall reports 75-80% improvement in balance confidence. Patient tolerated gait training without AD on grass and pavement while maintaining conversation and performing head turns; no evident instability or dizziness noted. Patient reports no longer feeling dizzy day to day but rather reports intermittent unsteadiness/hesitation which sounds more like fear of falling/fear of heights and patient is in agreement with this. Reviewed and consolidated HEP for max benefit. Encouraged HEP compliance and general fitness regimen for continued benefit. Patient reported understanding and without complaints at end of session. Patient has met all goals at this time and is ready for DC with transition to HEP.    Comorbidities CHF, CKD, CAD, HLD, HTN, CABG s/p MI 2006    PT Frequency 1x / week    PT Duration 6 weeks    PT Treatment/Interventions ADLs/Self Care Home Management;Canalith Repostioning;Cryotherapy;Moist Heat;DME Instruction;Neuromuscular re-education;Balance training;Therapeutic exercise;Therapeutic activities;Functional mobility training;Stair training;Gait training;Patient/family education;Manual techniques;Energy conservation;Dry needling;Passive range of motion;Taping;Vestibular;Visual/perceptual remediation/compensation    PT Next Visit Plan DC at this time    Consulted and Agree with Plan of Care Patient             Patient will benefit from skilled therapeutic intervention in order to improve the following deficits and impairments:  Abnormal gait, Decreased coordination, Difficulty walking, Dizziness, Decreased balance, Impaired flexibility, Improper body mechanics, Postural dysfunction,  Decreased strength  Visit Diagnosis: Dizziness and giddiness  Unsteadiness on feet  Other lack of coordination     Problem List Patient Active Problem List   Diagnosis Date Noted   CAP (community acquired pneumonia) 02/16/2021   Benign neoplasm of colon    Hepatic cirrhosis ([place])    Gastritis and gastroduodenitis    Esophageal varices without bleeding ([place])    Atrial flutter ([place])    Atypical atrial flutter ([place])    Aortic stenosis 10/11/2017   Murmur 06/26/2015   History of colon polyps 09/20/2013   Impaired glucose tolerance 09/20/2013   Exogenous obesity 09/20/2013   Thrombocytopenia, unspecified ([place]) 09/20/2013   Bruit 08/28/2013   Chronic combined systolic and diastolic heart failure ([place]) 12/09/2008   DYSPNEA 12/09/2008   OBESITY 07/19/2008   WEIGHT GAIN 07/15/2008   ADJUSTMENT DISORDER WITH DEPRESSED MOOD 09/07/2007   CELLULITIS, RIGHT LEG 07/04/2007   Dyslipidemia 08/09/2006   Essential hypertension 08/09/2006   MYOCARDIAL INFARCTION, HX OF 08/09/2006   Coronary atherosclerosis 08/09/2006   NEPHROLITHIASIS, HX OF 08/09/2006    PHYSICAL THERAPY DISCHARGE SUMMARY  Visits from Start of Care: 11  Current functional level related to goals / functional outcomes: See above clinical impression   Remaining deficits: Infrequent dizziness   Education / Equipment: HEP  Plan: Patient agrees to discharge.  Patient goals were met. Patient is being discharged due to meeting the stated rehab goals.        Janene Harvey, PT, DPT 02/18/21 4:24 PM   [facility] [place] [address] [place], [place], [address] Phone: [phone]   Fax:  [phone]  Name: [name] MRN: [id number] Date of Birth: [birth year]

## 2021-02-19 NOTE — Progress Notes (Addendum)
NEUROLOGY CONSULTATION NOTE  DEMON VOLANTE MRN: [id number] DOB: 06/28/1950  Referring provider: Lelon Frohlich, MD Primary care provider: Matthew Saras, MD  Reason for consult:  vertigo, balance disorder  Assessment/Plan:   Gait instability - at this time, I do not suspect a concerning primary neurologic disorder.  It may be age-related.  He does not exhibit signs or symptoms of a neurodegenerative disease, cervical myelopathy, lumbar stenosis or polyneuropathy.  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   Subjective:  [name] is a 71 year old male with nonalcoholic cirrhosis, thrombocytopenia, CAD, CHF, HTN, HLD, OSA who presents for vertigo.  History supplemented by referring provider's note.  For the past 2 years, he had been feeling unsteady.  It is not a dizziness or vertigo.  When he is on his feet, he feels off balance.  No neck pain.  Has chronic mid low-back pain but no radiculopathy, lower extremity numbness or weakness.  Denies visual disturbance.  Some of his gait instability is related to his knee.  No change in past 2 years.  Workup by cardiology in 2021, including echo and event monitor was unremarkable.  He had an MRI of the brain without contrast on 12/22/2020 personally reviewed which showed nonspecific cerebral white matter disease likely chronic small vessel ischemic changes but radiologist raised possibility of demyelinating disease due to prominent periventricular involvement including lesions oriented perpendicularly to the lateral ventricles.  2D echocardiogram on 12/10/2020 showed EF 55-60% with grade II diastolic dysfunction with severe calcification of the aortic valve, mild mitral  valve regurgitation and no atrial level shunt.       PAST MEDICAL HISTORY: Past Medical History:  Diagnosis Date   Adjustment disorder with depressed mood 09/07/2007   Cataract    removed bilaterally   Chronic kidney disease    kidney stones   Cirrhosis ([place])    Colon polyps    Tubular Adenoma 2010   CONGESTIVE HEART FAILURE 12/09/2008   CORONARY ARTERY DISEASE 2006   HYPERLIPIDEMIA 08/09/2006   HYPERTENSION 08/09/2006   MYOCARDIAL INFARCTION, HX OF 07/07/2004   NEPHROLITHIASIS, HX OF 08/09/2006   OBESITY 07/19/2008   Sleep apnea    has a cpap - not using religiously    PAST SURGICAL HISTORY: Past Surgical History:  Procedure Laterality Date   BIOPSY  11/18/2020   Procedure: BIOPSY;  Surgeon: Yetta Flock, MD;  Location: [facility];  Service: Gastroenterology;;   CARDIOVERSION N/A 12/14/2017   Procedure: CARDIOVERSION;  Surgeon: [name], MD;  Location: [place];  Service: Cardiovascular;  Laterality: N/A;   CARDIOVERSION N/A 10/01/2019   Procedure: CARDIOVERSION;  Surgeon: Buford Dresser, MD;  Location: [place];  Service: Cardiovascular;  Laterality: N/A;   COLONOSCOPY     COLONOSCOPY WITH PROPOFOL N/A 11/18/2020   Procedure: COLONOSCOPY WITH PROPOFOL;  Surgeon: Yetta Flock, MD;  [facility];  Service: Gastroenterology;  Laterality: N/A;   CORONARY ARTERY BYPASS GRAFT  2006   ESOPHAGOGASTRODUODENOSCOPY (EGD) WITH PROPOFOL N/A 11/18/2020   Procedure: ESOPHAGOGASTRODUODENOSCOPY (EGD) WITH PROPOFOL;  Surgeon: Yetta Flock, MD;  [facility]: [facility];  Service: Gastroenterology;  Laterality: N/A;   POLYPECTOMY     POLYPECTOMY  11/18/2020   Procedure: POLYPECTOMY;  Surgeon: Yetta Flock,  MD;  [facility]: [facility];  Service: Gastroenterology;;    MEDICATIONS: Current Outpatient Medications on File Prior to Visit  Medication Sig Dispense Refill   acetaminophen (TYLENOL) 500 MG tablet Take 1,000 mg by  mouth daily as needed for moderate pain or headache.     amoxicillin-clavulanate (AUGMENTIN) 875-125 MG tablet Take 1 tablet by mouth 2 (two) times daily for 7 days. 14 tablet 0   atorvastatin (LIPITOR) 80 MG tablet TAKE 1 TABLET BY MOUTH EVERY DAY 30 tablet 1   carvedilol (COREG) 3.125 MG tablet Take 1 tablet (3.125 mg total) by mouth 2 (two) times daily with a meal. 60 tablet 2   [employer] VITAMIN B12 1000 MCG tablet TAKE 1 TABLET BY MOUTH EVERY DAY 180 tablet 1   ELIQUIS 5 MG TABS tablet TAKE 1 TABLET BY MOUTH TWICE A DAY 60 tablet 5   furosemide (LASIX) 20 MG tablet TAKE 3 TABLETS (60MG ) IN THE MORNING AND 2 TABLETS (40MG ) IN THE EVENING. 80 tablet 2   hydrocortisone 2.5 % cream Apply 1 application topically daily as needed (irritation).     KLOR-CON M20 20 MEQ tablet TAKE 1 TABLET BY MOUTH EVERY DAY 30 tablet 11   tamsulosin (FLOMAX) 0.4 MG CAPS capsule TAKE ONE CAPSULE BY MOUTH EVERY DAY AFTER SUPPER 30 capsule 1   No current facility-administered medications on file prior to visit.    ALLERGIES: No Known Allergies  FAMILY HISTORY: Family History  Problem Relation Age of Onset   Hypertension Mother    Hyperlipidemia Mother    Heart disease Father    Ulcers Father    Bipolar disorder Brother    Colon cancer Neg Hx    Colon polyps Neg Hx    Esophageal cancer Neg Hx    Rectal cancer Neg Hx    Stomach cancer Neg Hx     Objective:  Blood pressure 136/61, pulse (!) 59, height 5\' 6"  (1.676 m), weight 214 lb (97.1 kg), SpO2 92 %. General: No acute distress.  Patient appears well-groomed.   Head:  Normocephalic/atraumatic Eyes:  fundi examined but not visualized Neck: supple, no paraspinal tenderness, full range of motion Back: No paraspinal tenderness Heart: regular rate and rhythm Lungs: Clear to auscultation bilaterally. Vascular: No carotid bruits. Neurological Exam: Mental status: alert and oriented to person, place, and time, recent and remote memory intact, fund of knowledge  intact, attention and concentration intact, speech fluent and not dysarthric, language intact. Cranial nerves: CN I: not tested CN II: pupils equal, round and reactive to light, visual fields intact CN III, IV, VI:  full range of motion, no nystagmus, no ptosis CN V: facial sensation intact. CN VII: upper and lower face symmetric CN VIII: hearing intact CN IX, X: gag intact, uvula midline CN XI: sternocleidomastoid and trapezius muscles intact CN XII: tongue midline Bulk & Tone: normal, no fasciculations. Motor:  muscle strength 5/5 throughout Sensation:  Pinprick, temperature and vibratory sensation intact. Deep Tendon Reflexes:  2+ throughout,  toes downgoing.   Finger to nose testing:  Without dysmetria.   Heel to shin:  Without dysmetria.   Gait:      Thank you for allowing me to take part in the care of this patient.  Metta Clines, DO  CC: Lelon Frohlich, MD

## 2021-02-24 ENCOUNTER — Ambulatory Visit: Payer: 59 | Admitting: Neurology

## 2021-02-24 ENCOUNTER — Other Ambulatory Visit: Payer: Self-pay

## 2021-02-24 ENCOUNTER — Encounter: Payer: Self-pay | Admitting: Neurology

## 2021-02-24 VITALS — BP 136/61 | HR 59 | Ht 66.0 in | Wt 214.0 lb

## 2021-02-24 DIAGNOSIS — R2681 Unsteadiness on feet: Secondary | ICD-10-CM

## 2021-02-24 DIAGNOSIS — I679 Cerebrovascular disease, unspecified: Secondary | ICD-10-CM

## 2021-02-24 NOTE — Patient Instructions (Signed)
At this time, I do not suspect a concerning primary neurologic cause for balance.  It may be age-related.  Follow up in one year for re-evaluation.

## 2021-03-01 ENCOUNTER — Other Ambulatory Visit: Payer: Self-pay | Admitting: Gastroenterology

## 2021-03-02 ENCOUNTER — Other Ambulatory Visit: Payer: Self-pay | Admitting: Cardiology

## 2021-03-02 DIAGNOSIS — I5042 Chronic combined systolic (congestive) and diastolic (congestive) heart failure: Secondary | ICD-10-CM

## 2021-03-04 ENCOUNTER — Ambulatory Visit: Payer: 59 | Admitting: Physical Therapy

## 2021-03-06 ENCOUNTER — Other Ambulatory Visit: Payer: Self-pay | Admitting: Internal Medicine

## 2021-03-13 ENCOUNTER — Ambulatory Visit: Payer: 59 | Admitting: Cardiovascular Disease

## 2021-04-01 ENCOUNTER — Other Ambulatory Visit: Payer: 59

## 2021-04-01 ENCOUNTER — Telehealth: Payer: Self-pay

## 2021-04-01 NOTE — Telephone Encounter (Signed)
Called and Left detailed message for patient that he has been tentatively scheduled for his 2nd and final Hep A vaccine on Monday, 4-10, at 9:30 am. Asked him to call back to reschedule if this was an inconvenient day or time and we could do Thursday or Friday of that week ?

## 2021-04-01 NOTE — Telephone Encounter (Signed)
-----   Message from Patti E [place], [place] sent at 11/10/2020  2:57 PM EDT ----- ?Please set him up for Hep A for 04/10/2021. His Twinrix got split into A & B. ? ?

## 2021-04-04 ENCOUNTER — Other Ambulatory Visit: Payer: Self-pay | Admitting: Internal Medicine

## 2021-04-15 ENCOUNTER — Other Ambulatory Visit: Payer: Self-pay

## 2021-04-15 ENCOUNTER — Ambulatory Visit (INDEPENDENT_AMBULATORY_CARE_PROVIDER_SITE_OTHER): Payer: 59

## 2021-04-15 ENCOUNTER — Other Ambulatory Visit: Payer: 59

## 2021-04-15 ENCOUNTER — Other Ambulatory Visit: Payer: Self-pay | Admitting: Internal Medicine

## 2021-04-15 DIAGNOSIS — J189 Pneumonia, unspecified organism: Secondary | ICD-10-CM

## 2021-04-20 ENCOUNTER — Other Ambulatory Visit: Payer: Self-pay | Admitting: Internal Medicine

## 2021-04-20 DIAGNOSIS — R0602 Shortness of breath: Secondary | ICD-10-CM

## 2021-05-11 ENCOUNTER — Ambulatory Visit (INDEPENDENT_AMBULATORY_CARE_PROVIDER_SITE_OTHER): Payer: 59 | Admitting: Gastroenterology

## 2021-05-11 ENCOUNTER — Telehealth: Payer: Self-pay

## 2021-05-11 DIAGNOSIS — Z23 Encounter for immunization: Secondary | ICD-10-CM

## 2021-05-11 DIAGNOSIS — K7460 Unspecified cirrhosis of liver: Secondary | ICD-10-CM

## 2021-05-11 NOTE — Telephone Encounter (Signed)
Im in need of direction for this patients Hep A injection series.  ?On 10-10-2020 he received Twinrix # 1 ?On 11-10-2021 he received hep A and a Heplisav.  ?He came in today for a 3rd Hep A. Which would have been due but got the 2nd hep A on 11-10-2020 ?He is also extra covered on hep B, as we gave him a Heplisav today. ?Does this patient need an additional hep A as he has had 2?  ?[place] schedule for hep A would be day 1 and day 30  ?

## 2021-05-20 ENCOUNTER — Emergency Department (HOSPITAL_COMMUNITY): Payer: 59

## 2021-05-20 ENCOUNTER — Encounter (HOSPITAL_COMMUNITY): Payer: Self-pay

## 2021-05-20 ENCOUNTER — Emergency Department (HOSPITAL_COMMUNITY)
Admission: EM | Admit: 2021-05-20 | Discharge: 2021-05-20 | Disposition: A | Discharge status: Home / Self Care | Payer: 59 | Attending: Emergency Medicine | Admitting: Emergency Medicine

## 2021-05-20 ENCOUNTER — Other Ambulatory Visit: Payer: Self-pay

## 2021-05-20 ENCOUNTER — Telehealth: Payer: Self-pay | Admitting: Internal Medicine

## 2021-05-20 DIAGNOSIS — I10 Essential (primary) hypertension: Secondary | ICD-10-CM | POA: Insufficient documentation

## 2021-05-20 DIAGNOSIS — Z79899 Other long term (current) drug therapy: Secondary | ICD-10-CM | POA: Diagnosis not present

## 2021-05-20 DIAGNOSIS — M79605 Pain in left leg: Secondary | ICD-10-CM | POA: Diagnosis present

## 2021-05-20 DIAGNOSIS — Z7984 Long term (current) use of oral hypoglycemic drugs: Secondary | ICD-10-CM | POA: Diagnosis not present

## 2021-05-20 DIAGNOSIS — L03116 Cellulitis of left lower limb: Secondary | ICD-10-CM | POA: Insufficient documentation

## 2021-05-20 LAB — CBC WITH DIFFERENTIAL/PLATELET
Abs Immature Granulocytes: 0.05 10*3/uL (ref 0.00–0.07)
Basophils Absolute: 0 10*3/uL (ref 0.0–0.1)
Basophils Relative: 0 %
Eosinophils Absolute: 0.2 10*3/uL (ref 0.0–0.5)
Eosinophils Relative: 3 %
HCT: 35.7 % — ABNORMAL LOW (ref 39.0–52.0)
Hemoglobin: 11.7 g/dL — ABNORMAL LOW (ref 13.0–17.0)
Immature Granulocytes: 1 %
Lymphocytes Relative: 23 %
Lymphs Abs: 1.3 10*3/uL (ref 0.7–4.0)
MCH: 30.9 pg (ref 26.0–34.0)
MCHC: 32.8 g/dL (ref 30.0–36.0)
MCV: 94.2 fL (ref 80.0–100.0)
Monocytes Absolute: 0.4 10*3/uL (ref 0.1–1.0)
Monocytes Relative: 8 %
Neutro Abs: 3.5 10*3/uL (ref 1.7–7.7)
Neutrophils Relative %: 65 %
Platelets: 82 10*3/uL — ABNORMAL LOW (ref 150–400)
RBC: 3.79 MIL/uL — ABNORMAL LOW (ref 4.22–5.81)
RDW: 16.4 % — ABNORMAL HIGH (ref 11.5–15.5)
WBC: 5.5 10*3/uL (ref 4.0–10.5)
nRBC: 0 % (ref 0.0–0.2)

## 2021-05-20 LAB — COMPREHENSIVE METABOLIC PANEL
ALT: 24 U/L (ref 0–44)
AST: 44 U/L — ABNORMAL HIGH (ref 15–41)
Albumin: 2.5 g/dL — ABNORMAL LOW (ref 3.5–5.0)
Alkaline Phosphatase: 58 U/L (ref 38–126)
Anion gap: 6 (ref 5–15)
BUN: 22 mg/dL (ref 8–23)
CO2: 26 mmol/L (ref 22–32)
Calcium: 8.5 mg/dL — ABNORMAL LOW (ref 8.9–10.3)
Chloride: 106 mmol/L (ref 98–111)
Creatinine, Ser: 0.86 mg/dL (ref 0.61–1.24)
GFR, Estimated: >60 mL/min (ref >60)
Glucose, Bld: 145 mg/dL — ABNORMAL HIGH (ref 70–99)
Potassium: 3 mmol/L — ABNORMAL LOW (ref 3.5–5.1)
Sodium: 138 mmol/L (ref 135–145)
Total Bilirubin: 2.6 mg/dL — ABNORMAL HIGH (ref 0.3–1.2)
Total Protein: 7 g/dL (ref 6.5–8.1)

## 2021-05-20 LAB — PROTIME-INR
INR: 1.7 — ABNORMAL HIGH (ref 0.8–1.2)
Prothrombin Time: 19.9 seconds — ABNORMAL HIGH (ref 11.4–15.2)

## 2021-05-20 LAB — BRAIN NATRIURETIC PEPTIDE: B Natriuretic Peptide: 1190 pg/mL — ABNORMAL HIGH (ref 0.0–100.0)

## 2021-05-20 LAB — APTT: aPTT: 43 seconds — ABNORMAL HIGH (ref 24–36)

## 2021-05-20 LAB — LACTIC ACID, PLASMA: Lactic Acid, Venous: 2 mmol/L (ref 0.5–1.9)

## 2021-05-20 MED ORDER — DOXYCYCLINE HYCLATE 100 MG PO CAPS: 100.0000 mg | ORAL_CAPSULE | Freq: Two times a day (BID) | ORAL | 0 refills | Status: DC

## 2021-05-20 MED ORDER — POTASSIUM CHLORIDE CRYS ER 20 MEQ PO TBCR
40.0000 meq | EXTENDED_RELEASE_TABLET | Freq: Once | ORAL | Status: AC
Start: 2021-05-20 — End: 2021-05-20
  Administered 2021-05-20: 40 meq via ORAL
  Filled 2021-05-20: qty 2

## 2021-05-20 MED ORDER — VANCOMYCIN HCL IN DEXTROSE 1-5 GM/200ML-% IV SOLN
1000.0000 mg | Freq: Once | INTRAVENOUS | Status: AC
  Administered 2021-05-20: 1000 mg via INTRAVENOUS
  Filled 2021-05-20: qty 200

## 2021-05-20 MED ORDER — LACTATED RINGERS IV SOLN: INTRAVENOUS | Status: DC

## 2021-05-20 NOTE — Telephone Encounter (Signed)
Patient was told by triage to go to ER and patient refused. Patient stated that he has cellulitis on one leg from the ankle to the thigh. When asked the patient stated that in comparison to the other foot, his cellulitis foot was blue. This is when Allie gave the outcome of going to the ER ? ? ? ? ?Please advise   ?

## 2021-05-20 NOTE — Discharge Instructions (Signed)
Your testing today is very reassuring but I do suspect that you have a bacterial infection of the skin of your leg.  Because of this I would like for you to start taking the medication called doxycycline, take this twice a day for the next 10 days, if you should develop increasing pain, redness, swelling, fever or any worsening symptoms return to the emergency department immediately otherwise you can follow-up with your doctor within 2 or 3 days for a recheck to make sure things are getting better. ?

## 2021-05-20 NOTE — ED Triage Notes (Signed)
Patient arrived with complaints of left leg pain. Patient states hx of cellulitis. Left leg is noted to have swelling and redness.  ?

## 2021-05-20 NOTE — Telephone Encounter (Signed)
Spoke with patient and he is "on his way to the ED". ?

## 2021-05-20 NOTE — ED Provider Notes (Signed)
?[facility] ?Provider Note ? ? ?CSN: [id number] ?Arrival date & time: 05/20/21  1149 ? ?  ? ?History ? ?Chief Complaint  ?Patient presents with  ? Leg Pain  ? ? ?[name] is a 71 y.o. male. ? ? ?Leg Pain ? ?This patient is a very pleasant 71 year old male with a known history of hypertension on Coreg, hypercholesterolemia on atorvastatin and chronic peripheral edema on furosemide.  The patient also has a history of being on Eliquis though he cannot tell me why he is on the Eliquis.  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  He has not seen his family doctor but when he called the office today they recommended he come to the ER.  He also endorses some shortness of breath and wonders if his Lasix dose may be too low. ? ?Home Medications ?Prior to Admission medications   ?Medication Sig Start Date End Date Taking? Authorizing Provider  ?acetaminophen (TYLENOL) 500 MG tablet Take 1,000 mg by mouth daily as needed for moderate pain or headache.   Yes [provider]  ?atorvastatin (LIPITOR) 80 MG tablet TAKE 1 TABLET BY MOUTH EVERY DAY ?Patient taking differently: Take 80 mg by mouth daily. 04/06/21  Yes Isaac Bliss, Rayford Halsted, MD  ?carvedilol (COREG) 3.125 MG tablet TAKE 1 TABLET (3.125 MG TOTAL) BY MOUTH 2 (TWO) TIMES DAILY WITH A MEAL. 03/01/21  Yes Armbruster, Carlota Raspberry, MD  ?[facility] VITAMIN B12 1000 MCG tablet TAKE 1 TABLET BY MOUTH EVERY DAY ?Patient taking differently: Take 1,000 mcg by mouth daily. 01/22/21  Yes Orson Slick, MD  ?doxycycline (VIBRAMYCIN) 100 MG capsule Take 1 capsule (100 mg total) by mouth 2 (two) times daily. 05/20/21  Yes Noemi Chapel,  MD  ?ELIQUIS 5 MG TABS tablet TAKE 1 TABLET BY MOUTH TWICE A DAY ?Patient taking differently: Take 5 mg by mouth 2 (two) times daily. 02/09/21  Yes Sande Rives E, PA-C  ?furosemide (LASIX) 20 MG tablet TAKE 3 TABLETS ('60MG'$ ) IN THE MORNING AND 2 TABLETS ('40MG'$ ) IN THE EVENING. ?Patient taking differently: Take 40-60 mg by mouth 2 (two) times daily. Take 3 tablets ('60mg'$ ) in the morning and 2 tablets ('40mg'$ ) in the evening. 03/02/21  Yes Crenshaw, Denice Bors, MD  ?KLOR-CON M20 20 MEQ tablet TAKE 1 TABLET BY MOUTH EVERY DAY ?Patient taking differently: Take 20 mEq by mouth daily. 12/19/20  Yes Lelon Perla, MD  ?tamsulosin (FLOMAX) 0.4 MG CAPS capsule TAKE ONE CAPSULE BY MOUTH EVERY DAY AFTER SUPPER ?Patient taking differently: Take 0.4 mg by mouth daily after supper. 04/06/21  Yes Erline Hau, MD  ?   ? ?Allergies    ?Patient has no known allergies.   ? ?Review of Systems   ?Review of Systems  ?All other systems reviewed and are negative. ? ?Physical Exam ?Updated Vital Signs ?BP (!) 137/55 (BP Location: Right Arm)   Pulse 72   Temp 98 ?F (36.7 ?C) (Oral)   Resp (!) 23   Ht 1.676 m ('5\' 6"'$ )   Wt 97.4 kg   SpO2 93%   BMI 34.65 kg/m?  ?Physical Exam ?Vitals and nursing  note reviewed.  ?Constitutional:   ?   General: He is not in acute distress. ?   Appearance: He is well-developed.  ?HENT:  ?   Head: Normocephalic and atraumatic.  ?   Mouth/Throat:  ?   Pharynx: No oropharyngeal exudate.  ?Eyes:  ?   General: No scleral icterus.    ?   Right eye: No discharge.     ?   Left eye: No discharge.  ?   Conjunctiva/sclera: Conjunctivae normal.  ?   Pupils: Pupils are equal, round, and reactive to light.  ?Neck:  ?   Thyroid: No thyromegaly.  ?   Vascular: No JVD.  ?Cardiovascular:  ?   Rate and Rhythm: Normal rate and regular rhythm.  ?   Heart sounds: Murmur heard.  ?  No friction rub. No gallop.  ?   Comments: Prominent systolic heart murmur ?Pulmonary:  ?   Effort: Pulmonary effort is normal. No  respiratory distress.  ?   Breath sounds: Normal breath sounds. No wheezing or rales.  ?Abdominal:  ?   General: Bowel sounds are normal. There is no distension.  ?   Palpations: Abdomen is soft. There is no mass.  ?   Tenderness: There is no abdominal tenderness.  ?Musculoskeletal:     ?   General: Swelling and tenderness present. Normal range of motion.  ?   Cervical back: Normal range of motion and neck supple.  ?   Right lower leg: Edema present.  ?   Left lower leg: Edema present.  ?   Comments: Bilateral lower extremities with 2+ pitting edema, they are symmetrical though the left leg is slightly larger than the right.  There is significant erythema of the entire left lower extremity below the knee all the way through the foot, there is significant warmth redness and swelling that extends onto the medial aspect of the left thigh, there is mild inguinal lymphadenopathy on the left.  ?Lymphadenopathy:  ?   Cervical: No cervical adenopathy.  ?Skin: ?   General: Skin is warm and dry.  ?   Findings: No erythema or rash.  ?Neurological:  ?   General: No focal deficit present.  ?   Mental Status: He is alert.  ?   Coordination: Coordination normal.  ?Psychiatric:     ?   Behavior: Behavior normal.  ? ? ?ED Results / Procedures / Treatments   ?Labs ?(all labs ordered are listed, but only abnormal results are displayed) ?Labs Reviewed  ?LACTIC ACID, PLASMA - Abnormal; Notable for the following components:  ?    Result Value  ? Lactic Acid, Venous 2.0 (*)   ? All other components within normal limits  ?COMPREHENSIVE METABOLIC PANEL - Abnormal; Notable for the following components:  ? Potassium 3.0 (*)   ? Glucose, Bld 145 (*)   ? Calcium 8.5 (*)   ? Albumin 2.5 (*)   ? AST 44 (*)   ? Total Bilirubin 2.6 (*)   ? All other components within normal limits  ?CBC WITH DIFFERENTIAL/PLATELET - Abnormal; Notable for the following components:  ? RBC 3.79 (*)   ? Hemoglobin 11.7 (*)   ? HCT 35.7 (*)   ? RDW 16.4 (*)   ? Platelets  82 (*)   ? All other components within normal limits  ?PROTIME-INR - Abnormal; Notable for the following components:  ? Prothrombin Time 19.9 (*)   ? INR 1.7 (*)   ? All other components within normal limits  ?  APTT - Abnormal; Notable for the following components:  ? aPTT 43 (*)   ? All other components within normal limits  ?BRAIN NATRIURETIC PEPTIDE - Abnormal; Notable for the following components:  ? B Natriuretic Peptide 1,190.0 (*)   ? All other components within normal limits  ?CULTURE, BLOOD (ROUTINE X 2)  ?CULTURE, BLOOD (ROUTINE X 2)  ? ? ?EKG ?EKG Interpretation ? ?Date/Time:  Wednesday May 20 2021 12:37:43 EDT ?Ventricular Rate:  73 ?PR Interval:    ?QRS Duration: 178 ?QT Interval:  460 ?QTC Calculation: 507 ?R Axis:   -64 ?Text Interpretation: Accelerated junctional rhythm Right bundle branch block Anterior infarct, age indeterminate Baseline wander in lead(s) V1 V4 since last tracing no significant change Confirmed by Noemi Chapel [phone]) on 05/20/2021 12:43:19 PM ? ?Radiology ?DG Chest Port 1 View ? ?Result Date: 05/20/2021 ?CLINICAL DATA:  Sepsis.  Coronary artery disease. EXAM: PORTABLE CHEST 1 VIEW COMPARISON:  04/15/2021 FINDINGS: Mild cardiomegaly stable. Prior CABG again noted. Chronic pulmonary interstitial prominence again noted. No evidence of acute infiltrate or edema. No evidence of pleural effusion. IMPRESSION: Stable mild cardiomegaly and chronic pulmonary interstitial prominence. No acute findings. Electronically Signed   By: Marlaine Hind M.D.   On: 05/20/2021 12:45   ? ?Procedures ?Procedures  ? ? ?Medications Ordered in ED ?Medications  ?vancomycin (VANCOCIN) IVPB 1000 mg/200 mL premix (0 mg Intravenous Stopping previously hung infusion 05/20/21 1350)  ?potassium chloride SA (KLOR-CON M) CR tablet 40 mEq (40 mEq Oral Given 05/20/21 1355)  ? ? ?ED Course/ Medical Decision Making/ A&P ?  ?                        ?Medical Decision Making ?Amount and/or Complexity of Data Reviewed ?Labs:  ordered. ?Radiology: ordered. ?ECG/medicine tests: ordered. ? ?Risk ?Prescription drug management. ? ? ?This patient presents to the ED for concern of increasing redness warmth and swelling of the leg, this involves a

## 2021-05-27 LAB — CULTURE, BLOOD (ROUTINE X 2)
Culture: NO GROWTH
Culture: NO GROWTH
Special Requests: ADEQUATE
Special Requests: ADEQUATE

## 2021-05-29 ENCOUNTER — Telehealth: Payer: Self-pay | Admitting: Cardiovascular Disease

## 2021-05-29 NOTE — Telephone Encounter (Signed)
What problem are you experiencing? Patient states that he is having a hard time staying disciplined with using his CPAP. He wants to know if there is a class he can take to help him get used to wearing it.  ? ?Who is your medical equipment company? [employer] ? ? ?Patient has not been seen since 2021, he has cancelled several appts ? ? ?Please route to the sleep study assistant.  ?

## 2021-05-31 ENCOUNTER — Other Ambulatory Visit: Payer: Self-pay | Admitting: Gastroenterology

## 2021-06-03 ENCOUNTER — Ambulatory Visit: Payer: 59 | Admitting: Cardiovascular Disease

## 2021-06-03 ENCOUNTER — Ambulatory Visit: Payer: 59 | Admitting: Internal Medicine

## 2021-06-03 ENCOUNTER — Encounter: Payer: Self-pay | Admitting: Internal Medicine

## 2021-06-03 VITALS — BP 130/70 | HR 76 | Temp 98.7°F | Ht 64.5 in | Wt 203.6 lb

## 2021-06-03 DIAGNOSIS — I10 Essential (primary) hypertension: Secondary | ICD-10-CM | POA: Diagnosis not present

## 2021-06-03 DIAGNOSIS — E785 Hyperlipidemia, unspecified: Secondary | ICD-10-CM | POA: Diagnosis not present

## 2021-06-03 DIAGNOSIS — Z09 Encounter for follow-up examination after completed treatment for conditions other than malignant neoplasm: Secondary | ICD-10-CM

## 2021-06-03 DIAGNOSIS — I484 Atypical atrial flutter: Secondary | ICD-10-CM

## 2021-06-03 DIAGNOSIS — L03116 Cellulitis of left lower limb: Secondary | ICD-10-CM

## 2021-06-03 DIAGNOSIS — I350 Nonrheumatic aortic (valve) stenosis: Secondary | ICD-10-CM

## 2021-06-03 NOTE — Progress Notes (Signed)
? ? ? ?Established Patient Office Visit ? ? ? ? ?CC/Reason for Visit: ED follow-up. ? ?HPI: [name] is a 71 y.o. male who is coming in today for the above mentioned reasons. Past Medical History is significant for: aortic valve stenosis followed by Dr. Stanford Breed, atrial fibrillation status post DCCV on Eliquis and Toprol, obstructive sleep apnea on nightly CPAP, hypertension, hyperlipidemia, morbid obesity.  He has been found to have cirrhosis secondary to [place].  He has heart failure with preserved ejection fraction with grade 2 diastolic dysfunction and moderate aortic valve stenosis.  He has been on in the past and not be compliant with Lasix dosing due to inconvenience of frequent bathroom breaks.  He was recently seen in the emergency department for left leg cellulitis on April 19.  His symptoms started 4 days prior with swelling and redness of the left leg fever and chills.  He was given IV antibiotics in the ED and discharged home on 10 days of doxycycline.  He was also asked to increase his Lasix dose for 10 days.  He feels like he is now back to his baseline. ? ? ?Past Medical/Surgical History: ?Past Medical History:  ?Diagnosis Date  ? Adjustment disorder with depressed mood 09/07/2007  ? Cataract   ? removed bilaterally  ? Chronic kidney disease   ? kidney stones  ? Cirrhosis ([place])   ? Colon polyps   ? Tubular Adenoma 2010  ? CONGESTIVE HEART FAILURE 12/09/2008  ? CORONARY ARTERY DISEASE 2006  ? HYPERLIPIDEMIA 08/09/2006  ? HYPERTENSION 08/09/2006  ? MYOCARDIAL INFARCTION, HX OF 07/07/2004  ? NEPHROLITHIASIS, HX OF 08/09/2006  ? OBESITY 07/19/2008  ? Sleep apnea   ? has a cpap - not using religiously  ? ? ?Past Surgical History:  ?Procedure Laterality Date  ? BIOPSY  11/18/2020  ? Procedure: BIOPSY;  Surgeon: Yetta Flock, MD;  Location: [facility];  Service: Gastroenterology;;  ? CARDIOVERSION N/A 12/14/2017  ? Procedure: CARDIOVERSION;  Surgeon: [name], MD;  Location: [facility];  Service: Cardiovascular;  Laterality: N/A;  ? CARDIOVERSION N/A 10/01/2019  ? Procedure: CARDIOVERSION;  Surgeon: Buford Dresser, MD;  Location: [facility] ENDOSCOPY;  Service: Cardiovascular;  Laterality: N/A;  ? COLONOSCOPY    ? COLONOSCOPY WITH PROPOFOL N/A 11/18/2020  ? Procedure: COLONOSCOPY WITH PROPOFOL;  Surgeon: Yetta Flock, MD;  Location: [facility] ENDOSCOPY;  Service: Gastroenterology;  Laterality: N/A;  ? CORONARY ARTERY BYPASS GRAFT  2006  ? ESOPHAGOGASTRODUODENOSCOPY (EGD) WITH PROPOFOL N/A 11/18/2020  ? Procedure: ESOPHAGOGASTRODUODENOSCOPY (EGD) WITH PROPOFOL;  Surgeon: Yetta Flock, MD;  Location: [facility] ENDOSCOPY;  Service: Gastroenterology;  Laterality: N/A;  ? POLYPECTOMY    ? POLYPECTOMY  11/18/2020  ? Procedure: POLYPECTOMY;  Surgeon: Yetta Flock, MD;  Location: [facility];  Service: Gastroenterology;;  ? ? ?Social History: ? reports that he has never smoked. He has never used smokeless tobacco. He reports current alcohol use of about 1.0 standard drink per week. He reports that he does not use drugs. ? ?Allergies: ?No Known Allergies ? ?Family History:  ?Family History  ?Problem Relation Age of Onset  ? Hypertension Mother   ? Hyperlipidemia Mother   ? Heart disease Father   ? Ulcers Father   ? Bipolar disorder Brother   ? Colon cancer Neg Hx   ? Colon polyps Neg Hx   ? Esophageal cancer Neg Hx   ? Rectal cancer Neg Hx   ? Stomach cancer Neg Hx   ? ? ? ?  Current Outpatient Medications:  ?  acetaminophen (TYLENOL) 500 MG tablet, Take 1,000 mg by mouth daily as needed for moderate pain or headache., Disp: , Rfl:  ?  atorvastatin (LIPITOR) 80 MG tablet, TAKE 1 TABLET BY MOUTH EVERY DAY (Patient taking differently: Take 80 mg by mouth daily.), Disp: 90 tablet, Rfl: 1 ?  carvedilol (COREG) 3.125 MG tablet, TAKE 1 TABLET BY MOUTH TWICE A DAY WITH A MEAL, Disp: 60 tablet, Rfl: 2 ?  [facility]S VITAMIN B12 1000 MCG tablet, TAKE 1 TABLET BY MOUTH EVERY DAY (Patient taking  differently: Take 1,000 mcg by mouth daily.), Disp: 180 tablet, Rfl: 1 ?  ELIQUIS 5 MG TABS tablet, TAKE 1 TABLET BY MOUTH TWICE A DAY (Patient taking differently: Take 5 mg by mouth 2 (two) times daily.), Disp: 60 tablet, Rfl: 5 ?  furosemide (LASIX) 20 MG tablet, TAKE 3 TABLETS ('60MG'$ ) IN THE MORNING AND 2 TABLETS ('40MG'$ ) IN THE EVENING. (Patient taking differently: Take 40-60 mg by mouth 2 (two) times daily. Take 3 tablets ('60mg'$ ) in the morning and 2 tablets ('40mg'$ ) in the evening.), Disp: 150 tablet, Rfl: 4 ?  KLOR-CON M20 20 MEQ tablet, TAKE 1 TABLET BY MOUTH EVERY DAY (Patient taking differently: Take 20 mEq by mouth daily.), Disp: 30 tablet, Rfl: 11 ?  tamsulosin (FLOMAX) 0.4 MG CAPS capsule, TAKE ONE CAPSULE BY MOUTH EVERY DAY AFTER SUPPER (Patient taking differently: Take 0.4 mg by mouth daily after supper.), Disp: 90 capsule, Rfl: 1 ? ?Review of Systems:  ?Constitutional: Denies fever, chills, diaphoresis, appetite change and fatigue.  ?HEENT: Denies photophobia, eye pain, redness, hearing loss, ear pain, congestion, sore throat, rhinorrhea, sneezing, mouth sores, trouble swallowing, neck pain, neck stiffness and tinnitus.   ?Respiratory: Denies SOB, cough, chest tightness,  and wheezing.   ?Cardiovascular: Denies chest pain, palpitations. ?Gastrointestinal: Denies nausea, vomiting, abdominal pain, diarrhea, constipation, blood in stool and abdominal distention.  ?Genitourinary: Denies dysuria, urgency, frequency, hematuria, flank pain and difficulty urinating.  ?Endocrine: Denies: hot or cold intolerance, sweats, changes in hair or nails, polyuria, polydipsia. ?Musculoskeletal: Denies myalgias, back pain, joint swelling, arthralgias and gait problem.  ?Skin: Denies pallor, rash and wound.  ?Neurological: Denies dizziness, seizures, syncope, weakness, light-headedness, numbness and headaches.  ?Hematological: Denies adenopathy. Easy bruising, personal or family bleeding history  ?Psychiatric/Behavioral:  Denies suicidal ideation, mood changes, confusion, nervousness, sleep disturbance and agitation ? ? ? ?Physical Exam: ?Vitals:  ? 06/03/21 0715  ?BP: 130/70  ?Pulse: 76  ?Temp: 98.7 ?F (37.1 ?C)  ?TempSrc: Oral  ?SpO2: 92%  ?Weight: 203 lb 9.6 oz (92.4 kg)  ?Height: 5' 4.5" (1.638 m)  ? ? ?Body mass index is 34.41 kg/m?. ? ? ?Constitutional: NAD, calm, comfortable, obese ?Eyes: PERRL, lids and conjunctivae normal, wears corrective lenses ?ENMT: Mucous membranes are moist.  ?Respiratory: clear to auscultation bilaterally, no wheezing, no crackles. Normal respiratory effort. No accessory muscle use.  ?Cardiovascular: Regular rate and rhythm, strong systolic ejection murmur.  2+ pitting lower extremity edema left greater than right which is chronic.  He has some venous stasis changes to bilateral lower legs.  2+ pedal pulses. No carotid bruits.   ?Psychiatric: Normal judgment and insight. Alert and oriented x 3. Normal mood.  ? ? ?Impression and Plan: ? ?Hospital discharge follow-up ? ?Left leg cellulitis ? 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 ?Dyslipidemia ? ?Essential hypertension ? ?Nonrheumatic aortic valve stenosis ? ?-ED charts have been reviewed in detail. ?-He has completed antibiotic therapy and increase Lasix dosing. ?-He does appear  to have some changes consistent with chronic venous stasis, have advised him to be fitted for and wear compression stockings, he agrees. ?-He has follow-up with cardiology later this month.  He is being compliant with Lasix dosing.  No signs of significant volume overload today. ? ?Time spent:31 minutes reviewing chart, interviewing and examining patient and formulating plan of care. ? ? ? ? ?Lelon Frohlich, MD ?[facility] ? ? ?

## 2021-06-05 NOTE — Progress Notes (Signed)
HPI: FU CAD, AS and atrial flutter/fibrillation; history of coronary artery disease, status post coronary artery bypass graft surgery performed in June 2006. Patient had a LIMA to the LAD, saphenous vein graft to the diagonal, saphenous vein graft to the acute marginal and saphenous vein graft to the PDA. Carotid Dopplers August 2015 showed no significant stenosis. Nuclear study August 2015 showed ejection fraction 55% but no ischemia or infarction. Possible mild decrease in systolic blood pressure at peak exercise. Abdominal ultrasound June 2017 showed no aneurysm. Patient also with h/o atrial fibrillation/atrial flutter. Monitor December 2021 showed sinus rhythm with rare PAC and PVC.  Abdominal ultrasound June 2022 showed no aneurysm.  EGD October 2022 showed 1 moderate varix.  Echocardiogram repeated November 2022 and showed normal LV function, mild left ventricular enlargement, grade 2 diastolic dysfunction, mild left atrial enlargement, mild mitral regurgitation, moderate tricuspid regurgitation, moderate aortic stenosis with mean gradient 33 mmHg; DI 0.31.  Abdominal ultrasound December 2022 showed changes of cirrhosis.  Patient seen recently by primary care and felt to have excess volume.  He was not taking his Lasix as directed.  Chest x-ray January 2023 showed possible developing fibrosis versus potentially atypical infection.  Since last seen he notes increased dyspnea on exertion.  No orthopnea, PND, chest pain or syncope.  No bleeding.  He has chronic lower extremity edema that is reasonly well controlled with diuretics by his report.  Current Outpatient Medications  Medication Sig Dispense Refill   acetaminophen (TYLENOL) 500 MG tablet Take 1,000 mg by mouth daily as needed for moderate pain or headache.     atorvastatin (LIPITOR) 80 MG tablet TAKE 1 TABLET BY MOUTH EVERY DAY (Patient taking differently: Take 80 mg by mouth daily.) 90 tablet 1   carvedilol (COREG) 3.125 MG tablet TAKE 1  TABLET BY MOUTH TWICE A DAY WITH A MEAL 60 tablet 2   [facility]S VITAMIN B12 1000 MCG tablet TAKE 1 TABLET BY MOUTH EVERY DAY (Patient taking differently: Take 1,000 mcg by mouth daily.) 180 tablet 1   ELIQUIS 5 MG TABS tablet TAKE 1 TABLET BY MOUTH TWICE A DAY (Patient taking differently: Take 5 mg by mouth 2 (two) times daily.) 60 tablet 5   furosemide (LASIX) 20 MG tablet TAKE 3 TABLETS ('60MG'$ ) IN THE MORNING AND 2 TABLETS ('40MG'$ ) IN THE EVENING. (Patient taking differently: Take 40-60 mg by mouth 2 (two) times daily. Take 3 tablets ('60mg'$ ) in the morning and 2 tablets ('40mg'$ ) in the evening.) 150 tablet 4   KLOR-CON M20 20 MEQ tablet TAKE 1 TABLET BY MOUTH EVERY DAY (Patient taking differently: Take 20 mEq by mouth daily.) 30 tablet 11   tamsulosin (FLOMAX) 0.4 MG CAPS capsule TAKE ONE CAPSULE BY MOUTH EVERY DAY AFTER SUPPER (Patient taking differently: Take 0.4 mg by mouth daily after supper.) 90 capsule 1   No current facility-administered medications for this visit.     Past Medical History:  Diagnosis Date   Adjustment disorder with depressed mood 09/07/2007   Cataract    removed bilaterally   Chronic kidney disease    kidney stones   Cirrhosis ([place])    Colon polyps    Tubular Adenoma 2010   CONGESTIVE HEART FAILURE 12/09/2008   CORONARY ARTERY DISEASE 2006   HYPERLIPIDEMIA 08/09/2006   HYPERTENSION 08/09/2006   MYOCARDIAL INFARCTION, HX OF 07/07/2004   NEPHROLITHIASIS, HX OF 08/09/2006   OBESITY 07/19/2008   Sleep apnea    has a cpap - not using religiously  Past Surgical History:  Procedure Laterality Date   BIOPSY  11/18/2020   Procedure: BIOPSY;  Surgeon: Yetta Flock, MD;  Location: [facility];  Service: Gastroenterology;;   CARDIOVERSION N/A 12/14/2017   Procedure: CARDIOVERSION;  Surgeon: [name], MD;  Location: [facility] ENDOSCOPY;  Service: Cardiovascular;  Laterality: N/A;   CARDIOVERSION N/A 10/01/2019   Procedure: CARDIOVERSION;  Surgeon: Buford Dresser, MD;  [facility];  Service: Cardiovascular;  Laterality: N/A;   COLONOSCOPY     COLONOSCOPY WITH PROPOFOL N/A 11/18/2020   Procedure: COLONOSCOPY WITH PROPOFOL;  Surgeon: Yetta Flock, MD;  Location: [facility] ENDOSCOPY;  Service: Gastroenterology;  Laterality: N/A;   CORONARY ARTERY BYPASS GRAFT  2006   ESOPHAGOGASTRODUODENOSCOPY (EGD) WITH PROPOFOL N/A 11/18/2020   Procedure: ESOPHAGOGASTRODUODENOSCOPY (EGD) WITH PROPOFOL;  Surgeon: Yetta Flock, MD;  Location: [facility] ENDOSCOPY;  Service: Gastroenterology;  Laterality: N/A;   POLYPECTOMY     POLYPECTOMY  11/18/2020   Procedure: POLYPECTOMY;  Surgeon: Yetta Flock, MD;  Location: [facility] ENDOSCOPY;  Service: Gastroenterology;;    Social History   Socioeconomic History   Marital status: Single    Spouse name: Not on file   Number of children: 0   Years of education: Not on file   Highest education level: Not on file  Occupational History   Occupation: [employer], jewellery  Tobacco Use   Smoking status: Never   Smokeless tobacco: Never  Vaping Use   Vaping Use: Never used  Substance and Sexual Activity   Alcohol use: Yes    Alcohol/week: 1.0 standard drink    Types: 1 Cans of beer per week    Comment: occasional   Drug use: No   Sexual activity: Not on file  Other Topics Concern   Not on file  Social History Narrative   Not on file   Social Determinants of Health   Financial Resource Strain: Not on file  Food Insecurity: Not on file  Transportation Needs: Not on file  Physical Activity: Not on file  Stress: Not on file  Social Connections: Not on file  Intimate Partner Violence: Not on file    Family History  Problem Relation Age of Onset   Hypertension Mother    Hyperlipidemia Mother    Heart disease Father    Ulcers Father    Bipolar disorder Brother    Colon cancer Neg Hx    Colon polyps Neg Hx    Esophageal cancer Neg Hx    Rectal cancer Neg Hx    Stomach  cancer Neg Hx     ROS: no fevers or chills, productive cough, hemoptysis, dysphasia, odynophagia, melena, hematochezia, dysuria, hematuria, rash, seizure activity, orthopnea, PND, claudication. Remaining systems are negative.  Physical Exam: Well-developed well-nourished in no acute distress.  Skin is warm and dry.  HEENT is normal.  Neck is supple.  Chest is clear to auscultation with normal expansion.  Cardiovascular exam is regular rate and rhythm.  3/6 systolic murmur left sternal border. Abdominal exam nontender or distended. No masses palpated. Extremities show 2+ edema. neuro grossly intact   A/P  1 aortic stenosis-patient notes increased dyspnea on exertion.  I will repeat echocardiogram to reassess aortic stenosis.  He understands he will likely require TAVR in the near future.  2 paroxysmal atrial fibrillation-he is in sinus rhythm on exam today.  Continue apixaban.  We will consider addition of antiarrhythmic in the future if he has more frequent episodes.  He would likely require Tikosyn.  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  Add Farxiga 10 mg daily.  Check potassium and renal function in 1 week.  4 coronary artery disease-he denies chest pain.  Continue statin.  He is not on aspirin given need for anticoagulation.  5 hypertension-patient's blood pressure is controlled.  Continue present medical regimen.  6 hyperlipidemia-continue statin.  7 cirrhosis-moderate varix on most recent EGD.  No indication of GI bleeding.  Kirk Ruths, MD

## 2021-06-19 ENCOUNTER — Other Ambulatory Visit: Payer: Self-pay | Admitting: Cardiology

## 2021-06-19 ENCOUNTER — Ambulatory Visit: Payer: 59 | Admitting: Cardiology

## 2021-06-19 ENCOUNTER — Encounter: Payer: Self-pay | Admitting: Cardiology

## 2021-06-19 VITALS — BP 126/52 | HR 66 | Ht 66.0 in | Wt 210.0 lb

## 2021-06-19 DIAGNOSIS — I10 Essential (primary) hypertension: Secondary | ICD-10-CM

## 2021-06-19 DIAGNOSIS — I5032 Chronic diastolic (congestive) heart failure: Secondary | ICD-10-CM

## 2021-06-19 DIAGNOSIS — E785 Hyperlipidemia, unspecified: Secondary | ICD-10-CM

## 2021-06-19 DIAGNOSIS — I480 Paroxysmal atrial fibrillation: Secondary | ICD-10-CM

## 2021-06-19 DIAGNOSIS — I350 Nonrheumatic aortic (valve) stenosis: Secondary | ICD-10-CM | POA: Diagnosis not present

## 2021-06-19 DIAGNOSIS — I2510 Atherosclerotic heart disease of native coronary artery without angina pectoris: Secondary | ICD-10-CM | POA: Diagnosis not present

## 2021-06-19 MED ORDER — DAPAGLIFLOZIN PROPANEDIOL 10 MG PO TABS: 10.0000 mg | ORAL_TABLET | Freq: Every day | ORAL | 11 refills | Status: DC

## 2021-06-19 NOTE — Patient Instructions (Signed)
Medication Instructions:   START FARXIGA 10 MG ONCE DAILY  *If you need a refill on your cardiac medications before your next appointment, please call your pharmacy*   Lab Work:  Your physician recommends that you return for lab work in:ONE WEEK-DO NOT NEED TO FAST  If you have labs (blood work) drawn today and your tests are completely normal, you will receive your results only by: [place] (if you have MyChart) OR A paper copy in the mail If you have any lab test that is abnormal or we need to change your treatment, we will call you to review the results.   Testing/Procedures:  Your physician has requested that you have an echocardiogram. Echocardiography is a painless test that uses sound waves to create images of your heart. It provides your doctor with information about the size and shape of your heart and how well your heart's chambers and valves are working. This procedure takes approximately one hour. There are no restrictions for this procedure. [place]   Follow-Up: At [facility], you and your health needs are our priority.  As part of our continuing mission to provide you with exceptional heart care, we have created designated Provider Care Teams.  These Care Teams include your primary Cardiologist (physician) and Advanced Practice Providers (APPs -  Physician Assistants and Nurse Practitioners) who all work together to provide you with the care you need, when you need it.  We recommend signing up for the patient portal called "MyChart".  Sign up information is provided on this After Visit Summary.  MyChart is used to connect with patients for Virtual Visits (Telemedicine).  Patients are able to view lab/test results, encounter notes, upcoming appointments, etc.  Non-urgent messages can be sent to your provider as well.   To learn more about what you can do with MyChart, go to [email or web address].    Your next appointment:   6-8 week(s)  The  format for your next appointment:   In Person  Provider:   Kirk Ruths, MD       Important Information About Sugar

## 2021-06-19 NOTE — Telephone Encounter (Signed)
PA for farxiga has been started

## 2021-06-22 ENCOUNTER — Telehealth: Payer: Self-pay | Admitting: *Deleted

## 2021-06-22 MED ORDER — EMPAGLIFLOZIN 10 MG PO TABS: 10.0000 mg | ORAL_TABLET | Freq: Every day | ORAL | 11 refills | Status: DC

## 2021-06-22 NOTE — Telephone Encounter (Signed)
Unable to reach pt or leave a message  

## 2021-06-22 NOTE — Telephone Encounter (Signed)
Called for prior autherization for farxiga and it is not covered under his insurance. [name] is covered with no prior auth needed.

## 2021-06-24 NOTE — Telephone Encounter (Signed)
Spoke with pt, aware jardiance has been called into the pharmacy.

## 2021-07-06 ENCOUNTER — Ambulatory Visit (HOSPITAL_COMMUNITY): Payer: 59 | Attending: Cardiovascular Disease

## 2021-07-06 DIAGNOSIS — I350 Nonrheumatic aortic (valve) stenosis: Secondary | ICD-10-CM | POA: Diagnosis present

## 2021-07-06 LAB — ECHOCARDIOGRAM COMPLETE
AR max vel: 1.39 cm2
AV Area VTI: 1.41 cm2
AV Area mean vel: 1.38 cm2
AV Mean grad: 32 mmHg
AV Peak grad: 56.9 mmHg
Ao pk vel: 3.77 m/s
Area-P 1/2: 2.22 cm2
S' Lateral: 4.3 cm

## 2021-07-06 MED ORDER — PERFLUTREN LIPID MICROSPHERE
2.0000 mL | INTRAVENOUS | Status: AC | PRN
  Administered 2021-07-06: 2 mL via INTRAVENOUS

## 2021-07-07 LAB — BASIC METABOLIC PANEL
BUN/Creatinine Ratio: 15 (ref 10–24)
BUN: 12 mg/dL (ref 8–27)
CO2: 21 mmol/L (ref 20–29)
Calcium: 9.1 mg/dL (ref 8.6–10.2)
Chloride: 105 mmol/L (ref 96–106)
Creatinine, Ser: 0.82 mg/dL (ref 0.76–1.27)
Glucose: 107 mg/dL — ABNORMAL HIGH (ref 70–99)
Potassium: 3.9 mmol/L (ref 3.5–5.2)
Sodium: 140 mmol/L (ref 134–144)
eGFR: 94 mL/min/{1.73_m2} (ref >59)

## 2021-07-09 ENCOUNTER — Telehealth: Payer: Self-pay

## 2021-07-09 DIAGNOSIS — K7460 Unspecified cirrhosis of liver: Secondary | ICD-10-CM

## 2021-07-09 NOTE — Telephone Encounter (Signed)
Called and spoke with patient to remind him that he is due for routine RUQ [place] at this time and due for repeat AFP lab. Pt is aware that radiology scheduling will contact him to set up his appt. Pt knows that he can stop by the lab at his convenience to have lab drawn. Pt states that it will probably be next week before he can come by for labs. I told pt that this was fine and we will be contacting him regarding his results as they return. Pt verbalized understanding and had no concerns at the end of the call.  RUQ [place] order in epic. Secures staff message sent to radiology scheduling to contact pt to set up appt.  Lab order in epic.

## 2021-07-09 NOTE — Telephone Encounter (Signed)
-----   Message from Yetta Flock, MD sent at 07/08/2021  5:36 PM EDT ----- Regarding: RE: [place] AFP only. Rest of labs not until October. Thanks!  ----- Message ----- From: Yevette Edwards, RN Sent: 07/08/2021   8:28 AM EDT To: Yetta Flock, MD Subject: FW: [place]                                         Good morning, Is patient due for labs at this time as well? ----- Message ----- From: Yevette Edwards, RN Sent: 07/08/2021  12:00 AM EDT To: Yevette Edwards, RN Subject: [place]                                             RUQ [place] - Cirrhosis, [place] screening

## 2021-07-16 ENCOUNTER — Other Ambulatory Visit: Payer: Self-pay

## 2021-07-16 ENCOUNTER — Emergency Department (HOSPITAL_BASED_OUTPATIENT_CLINIC_OR_DEPARTMENT_OTHER)
Admission: EM | Admit: 2021-07-16 | Discharge: 2021-07-16 | Disposition: A | Discharge status: Home / Self Care | Payer: 59 | Attending: Emergency Medicine | Admitting: Emergency Medicine

## 2021-07-16 ENCOUNTER — Encounter (HOSPITAL_BASED_OUTPATIENT_CLINIC_OR_DEPARTMENT_OTHER): Payer: Self-pay | Admitting: Emergency Medicine

## 2021-07-16 ENCOUNTER — Telehealth: Payer: Self-pay | Admitting: Internal Medicine

## 2021-07-16 DIAGNOSIS — I2510 Atherosclerotic heart disease of native coronary artery without angina pectoris: Secondary | ICD-10-CM | POA: Insufficient documentation

## 2021-07-16 DIAGNOSIS — N189 Chronic kidney disease, unspecified: Secondary | ICD-10-CM | POA: Diagnosis not present

## 2021-07-16 DIAGNOSIS — I4891 Unspecified atrial fibrillation: Secondary | ICD-10-CM | POA: Insufficient documentation

## 2021-07-16 DIAGNOSIS — I509 Heart failure, unspecified: Secondary | ICD-10-CM | POA: Diagnosis not present

## 2021-07-16 DIAGNOSIS — M7989 Other specified soft tissue disorders: Secondary | ICD-10-CM | POA: Diagnosis present

## 2021-07-16 DIAGNOSIS — Z951 Presence of aortocoronary bypass graft: Secondary | ICD-10-CM | POA: Insufficient documentation

## 2021-07-16 DIAGNOSIS — R600 Localized edema: Secondary | ICD-10-CM | POA: Insufficient documentation

## 2021-07-16 DIAGNOSIS — L03116 Cellulitis of left lower limb: Secondary | ICD-10-CM

## 2021-07-16 DIAGNOSIS — Z7901 Long term (current) use of anticoagulants: Secondary | ICD-10-CM | POA: Diagnosis not present

## 2021-07-16 DIAGNOSIS — K7460 Unspecified cirrhosis of liver: Secondary | ICD-10-CM | POA: Diagnosis not present

## 2021-07-16 DIAGNOSIS — Z79899 Other long term (current) drug therapy: Secondary | ICD-10-CM | POA: Diagnosis not present

## 2021-07-16 DIAGNOSIS — I130 Hypertensive heart and chronic kidney disease with heart failure and stage 1 through stage 4 chronic kidney disease, or unspecified chronic kidney disease: Secondary | ICD-10-CM | POA: Insufficient documentation

## 2021-07-16 MED ORDER — DOXYCYCLINE HYCLATE 100 MG PO CAPS: 100.0000 mg | ORAL_CAPSULE | Freq: Two times a day (BID) | ORAL | 0 refills | Status: DC

## 2021-07-16 NOTE — ED Notes (Signed)
Pt stated that he has gone through this before and he feels ok

## 2021-07-16 NOTE — ED Provider Notes (Signed)
[facility] EMERGENCY DEPT Provider Note   CSN: [id number] Arrival date & time: 07/16/21  1313     History  Chief Complaint  Patient presents with   Leg Swelling    [name] is a 71 y.o. male.  71 year old male with past medical history of CAD status post CABG, A-fib on anticoagulation, heart murmur, CHF presents today for evaluation of left lower extremity swelling, and erythema.  He states this started Monday.  Denies fever, chills, chest pain, shortness of breath.  He reports compliance with his anticoagulation.  Denies other complaints.  States he gets similar episode about every 6 months to 1 year.  Gets treated with antibiotics.  The history is provided by the patient. No language interpreter was used.       Home Medications Prior to Admission medications   Medication Sig Start Date End Date Taking? Authorizing Provider  acetaminophen (TYLENOL) 500 MG tablet Take 1,000 mg by mouth daily as needed for moderate pain or headache.    [provider]  atorvastatin (LIPITOR) 80 MG tablet TAKE 1 TABLET BY MOUTH EVERY DAY Patient taking differently: Take 80 mg by mouth daily. 04/06/21   Isaac Bliss, Rayford Halsted, MD  carvedilol (COREG) 3.125 MG tablet TAKE 1 TABLET BY MOUTH TWICE A DAY WITH A MEAL 05/31/21   Armbruster, Carlota Raspberry, MD  [facility] VITAMIN B12 1000 MCG tablet TAKE 1 TABLET BY MOUTH EVERY DAY Patient taking differently: Take 1,000 mcg by mouth daily. 01/22/21   Orson Slick, MD  ELIQUIS 5 MG TABS tablet TAKE 1 TABLET BY MOUTH TWICE A DAY Patient taking differently: Take 5 mg by mouth 2 (two) times daily. 02/09/21   Darreld Mclean, PA-C  empagliflozin (JARDIANCE) 10 MG TABS tablet Take 1 tablet (10 mg total) by mouth daily before breakfast. 06/22/21   Lelon Perla, MD  furosemide (LASIX) 20 MG tablet TAKE 3 TABLETS ('60MG'$ ) IN THE MORNING AND 2 TABLETS ('40MG'$ ) IN THE EVENING. Patient taking differently: Take 40-60 mg by mouth 2 (two) times  daily. Take 3 tablets ('60mg'$ ) in the morning and 2 tablets ('40mg'$ ) in the evening. 03/02/21   Lelon Perla, MD  KLOR-CON M20 20 MEQ tablet TAKE 1 TABLET BY MOUTH EVERY DAY Patient taking differently: Take 20 mEq by mouth daily. 12/19/20   Lelon Perla, MD  tamsulosin (FLOMAX) 0.4 MG CAPS capsule TAKE ONE CAPSULE BY MOUTH EVERY DAY AFTER SUPPER Patient taking differently: Take 0.4 mg by mouth daily after supper. 04/06/21   Isaac Bliss, Rayford Halsted, MD      Allergies    Patient has no known allergies.    Review of Systems   Review of Systems  Constitutional:  Negative for chills and fever.  Respiratory:  Negative for shortness of breath.   Cardiovascular:  Positive for leg swelling. Negative for chest pain and palpitations.  Gastrointestinal:  Negative for abdominal pain, nausea and vomiting.  All other systems reviewed and are negative.   Physical Exam Updated Vital Signs BP 140/63 (BP Location: Right Arm)   Pulse 76   Temp 98.4 F (36.9 C)   Resp 17   Ht '5\' 6"'$  (1.676 m)   Wt 91.2 kg   SpO2 92%   BMI 32.44 kg/m  Physical Exam Vitals and nursing note reviewed.  Constitutional:      General: He is not in acute distress.    Appearance: Normal appearance. He is not ill-appearing.  HENT:     Head:  Normocephalic and atraumatic.     Nose: Nose normal.  Eyes:     General: No scleral icterus.    Extraocular Movements: Extraocular movements intact.     Conjunctiva/sclera: Conjunctivae normal.  Cardiovascular:     Rate and Rhythm: Normal rate and regular rhythm.     Pulses: Normal pulses.     Heart sounds: Normal heart sounds.  Pulmonary:     Effort: Pulmonary effort is normal. No respiratory distress.     Breath sounds: Normal breath sounds. No wheezing or rales.  Abdominal:     General: There is no distension.     Tenderness: There is no abdominal tenderness.  Musculoskeletal:        General: Normal range of motion.     Cervical back: Normal range of motion.      Right lower leg: Edema (Trace pitting edema.  At patient's baseline) present.     Left lower leg: Edema (1+ edema present with erythema and warmth.) present.  Skin:    General: Skin is warm and dry.  Neurological:     General: No focal deficit present.     Mental Status: He is alert. Mental status is at baseline.     ED Results / Procedures / Treatments   Labs (all labs ordered are listed, but only abnormal results are displayed) Labs Reviewed - No data to display  EKG None  Radiology No results found.  Procedures Procedures    Medications Ordered in ED Medications - No data to display  ED Course/ Medical Decision Making/ A&P                           Medical Decision Making Medical Decision Making / ED Course   This patient presents to the ED for concern of leg swelling, this involves an extensive number of treatment options, and is a complaint that carries with it a high risk of complications and morbidity.  The differential diagnosis includes CHF exacerbation, DVT, cellulitis  MDM: 71 year old male with past medical history of CHF, A-fib on chronic anticoagulation which he reports compliance with presents today for about 3 to 4-day duration of swelling and erythema associated with warmth of his left lower extremity.  He states he gets this episode about every 6 months to a year.  However he recently had an episode of this in April.  He denies fever.  He is afebrile and without tachycardia.  Exam consistent with cellulitis.  Doubt systemic infection given he is without tachycardia and fever.  Doubt DVT given he is on chronic anticoagulation which he is compliant with him he has these recurrent episodes which resolved with antibiotics.  Doubt CHF exacerbation given he does not have shortness of breath, or chest pain and asymmetrical swelling.  Reports compliance with diuretic.  Discussed obtaining blood work however patient states this episode is not as bad as it typically gets  and given he is afebrile and without tachycardia suspicion for systemic infection is low.  Will defer blood work at this time.  We will start patient on antibiotics.  Strict return precautions discussed.  Discussed follow-up with PCP early next week.  Patient and wife both voiced understanding and are in agreement with plan.   Additional history obtained: -Additional history obtained from previous emergency room visit -External records from outside source obtained and reviewed including: Chart review including previous notes, labs, imaging, consultation notes   Lab Tests: -I ordered, reviewed, and interpreted  labs.   The pertinent results include:   Labs Reviewed - No data to display    EKG  EKG Interpretation  Date/Time:    Ventricular Rate:    PR Interval:    QRS Duration:   QT Interval:    QTC Calculation:   R Axis:     Text Interpretation:           Medicines ordered and prescription drug management: No orders of the defined types were placed in this encounter.   -I have reviewed the patients home medicines and have made adjustments as needed  Co morbidities that complicate the patient evaluation  Past Medical History:  Diagnosis Date   Adjustment disorder with depressed mood 09/07/2007   Cataract    removed bilaterally   Chronic kidney disease    kidney stones   Cirrhosis ([place])    Colon polyps    Tubular Adenoma 2010   CONGESTIVE HEART FAILURE 12/09/2008   CORONARY ARTERY DISEASE 2006   HYPERLIPIDEMIA 08/09/2006   HYPERTENSION 08/09/2006   MYOCARDIAL INFARCTION, HX OF 07/07/2004   NEPHROLITHIASIS, HX OF 08/09/2006   OBESITY 07/19/2008   Sleep apnea    has a cpap - not using religiously      Dispostion: Patient is appropriate for discharge.  Discharged in stable condition.  I will prescribe patient doxycycline.  Return precautions discussed.  Patient voices understanding and is in agreement with plan.   Final Clinical Impression(s) / ED  Diagnoses Final diagnoses:  None    Rx / DC Orders ED Discharge Orders     None         Evlyn Courier, PA-C 07/16/21 1804    Sherwood Gambler, MD 07/16/21 2131

## 2021-07-16 NOTE — ED Triage Notes (Signed)
L leg swelling and redness, hx of cellulitis.

## 2021-07-16 NOTE — Telephone Encounter (Signed)
Patient currently admitted to the ED.

## 2021-07-16 NOTE — Telephone Encounter (Signed)
FYI Spoke with patient and offered an office visit.  Patient states he will go to the ED.

## 2021-07-16 NOTE — Telephone Encounter (Signed)
Pt called back stating he was in the vicinity of the pharmacy and hoping it could be filled before he goes home

## 2021-07-16 NOTE — Discharge Instructions (Signed)
You likely have cellulitis given your recurrent history and exam today.  I have sent antibiotics into the pharmacy for you.  If you have any worsening symptoms return to the emergency room otherwise follow-up with your PCP towards beginning of next week to be reevaluated.

## 2021-07-16 NOTE — Telephone Encounter (Signed)
Pt requesting an antibiotic for his recurring bouts of cellulitis in his leg.    [id number] [place], [place] - [address] Phone:  [phone] Fax:  [phone]

## 2021-07-22 ENCOUNTER — Other Ambulatory Visit: Payer: Self-pay | Admitting: Gastroenterology

## 2021-07-22 NOTE — Progress Notes (Addendum)
HPI:FU CAD, AS and atrial flutter/fibrillation; history of coronary artery disease, status post coronary artery bypass graft surgery performed in June 2006. Patient had a LIMA to the LAD, saphenous vein graft to the diagonal, saphenous vein graft to the acute marginal and saphenous vein graft to the PDA. Carotid Dopplers August 2015 showed no significant stenosis. Nuclear study August 2015 showed ejection fraction 55% but no ischemia or infarction. Possible mild decrease in systolic blood pressure at peak exercise.  Patient also with h/o atrial fibrillation/atrial flutter. Monitor December 2021 showed sinus rhythm with rare PAC and PVC.  Abdominal ultrasound June 2022 showed no aneurysm.  EGD October 2022 showed 1 moderate varix. Abdominal ultrasound December 2022 showed changes of cirrhosis.  Echocardiogram repeated June 2023 and showed normal LV function, severe left ventricular enlargement, mild left ventricular hypertrophy, grade 2 diastolic dysfunction, mild right ventricular enlargement, moderate biatrial enlargement, mild mitral regurgitation, moderate aortic stenosis with mean gradient 32 mmHg, aortic valve area 1.4 cm and dimensionless index 0.34.  Since last seen he has some dyspnea on exertion.  No orthopnea, PND, chest pain or syncope.  He has chronic pedal edema which is worse.  Current Outpatient Medications  Medication Sig Dispense Refill   acetaminophen (TYLENOL) 500 MG tablet Take 1,000 mg by mouth daily as needed for moderate pain or headache.     atorvastatin (LIPITOR) 80 MG tablet TAKE 1 TABLET BY MOUTH EVERY DAY (Patient taking differently: Take 80 mg by mouth daily.) 90 tablet 1   carvedilol (COREG) 3.125 MG tablet TAKE 1 TABLET BY MOUTH TWICE A DAY WITH A MEAL 60 tablet 2   [facility] VITAMIN B12 1000 MCG tablet TAKE 1 TABLET BY MOUTH EVERY DAY (Patient taking differently: Take 1,000 mcg by mouth daily.) 180 tablet 1   doxycycline (VIBRAMYCIN) 100 MG capsule Take 1 capsule (100 mg  total) by mouth 2 (two) times daily. 20 capsule 0   ELIQUIS 5 MG TABS tablet TAKE 1 TABLET BY MOUTH TWICE A DAY (Patient taking differently: Take 5 mg by mouth 2 (two) times daily.) 60 tablet 5   empagliflozin (JARDIANCE) 10 MG TABS tablet Take 1 tablet (10 mg total) by mouth daily before breakfast. 30 tablet 11   furosemide (LASIX) 20 MG tablet TAKE 3 TABLETS ('60MG'$ ) IN THE MORNING AND 2 TABLETS ('40MG'$ ) IN THE EVENING. (Patient taking differently: Take 40-60 mg by mouth 2 (two) times daily. Take 3 tablets ('60mg'$ ) in the morning and 2 tablets ('40mg'$ ) in the evening.) 150 tablet 4   KLOR-CON M20 20 MEQ tablet TAKE 1 TABLET BY MOUTH EVERY DAY (Patient taking differently: Take 20 mEq by mouth daily.) 30 tablet 11   tamsulosin (FLOMAX) 0.4 MG CAPS capsule TAKE ONE CAPSULE BY MOUTH EVERY DAY AFTER SUPPER (Patient taking differently: Take 0.4 mg by mouth daily after supper.) 90 capsule 1   No current facility-administered medications for this visit.     Past Medical History:  Diagnosis Date   Adjustment disorder with depressed mood 09/07/2007   Cataract    removed bilaterally   Chronic kidney disease    kidney stones   Cirrhosis ([place])    Colon polyps    Tubular Adenoma 2010   CONGESTIVE HEART FAILURE 12/09/2008   CORONARY ARTERY DISEASE 2006   HYPERLIPIDEMIA 08/09/2006   HYPERTENSION 08/09/2006   MYOCARDIAL INFARCTION, HX OF 07/07/2004   NEPHROLITHIASIS, HX OF 08/09/2006   OBESITY 07/19/2008   Sleep apnea    has a cpap - not using  religiously    Past Surgical History:  Procedure Laterality Date   BIOPSY  11/18/2020   Procedure: BIOPSY;  Surgeon: Yetta Flock, MD;  Location: [facility];  Service: Gastroenterology;;   CARDIOVERSION N/A 12/14/2017   Procedure: CARDIOVERSION;  Surgeon: [name], MD;  Location: [facility] ENDOSCOPY;  Service: Cardiovascular;  Laterality: N/A;   CARDIOVERSION N/A 10/01/2019   Procedure: CARDIOVERSION;  Surgeon: Buford Dresser, MD;  Location:  [facility] ENDOSCOPY;  Service: Cardiovascular;  Laterality: N/A;   COLONOSCOPY     COLONOSCOPY WITH PROPOFOL N/A 11/18/2020   Procedure: COLONOSCOPY WITH PROPOFOL;  Surgeon: Yetta Flock, MD;  Location: [facility] ENDOSCOPY;  Service: Gastroenterology;  Laterality: N/A;   CORONARY ARTERY BYPASS GRAFT  2006   ESOPHAGOGASTRODUODENOSCOPY (EGD) WITH PROPOFOL N/A 11/18/2020   Procedure: ESOPHAGOGASTRODUODENOSCOPY (EGD) WITH PROPOFOL;  Surgeon: Yetta Flock, MD;  Location: [facility] ENDOSCOPY;  Service: Gastroenterology;  Laterality: N/A;   POLYPECTOMY     POLYPECTOMY  11/18/2020   Procedure: POLYPECTOMY;  Surgeon: Yetta Flock, MD;  Location: [facility] ENDOSCOPY;  Service: Gastroenterology;;    Social History   Socioeconomic History   Marital status: Single    Spouse name: Not on file   Number of children: 0   Years of education: Not on file   Highest education level: Not on file  Occupational History   Occupation: [employer], jewellery  Tobacco Use   Smoking status: Never   Smokeless tobacco: Never  Vaping Use   Vaping Use: Never used  Substance and Sexual Activity   Alcohol use: Yes    Alcohol/week: 1.0 standard drink of alcohol    Types: 1 Cans of beer per week    Comment: occasional   Drug use: No   Sexual activity: Not on file  Other Topics Concern   Not on file  Social History Narrative   Not on file   Social Determinants of Health   Financial Resource Strain: Not on file  Food Insecurity: Not on file  Transportation Needs: Not on file  Physical Activity: Not on file  Stress: Not on file  Social Connections: Not on file  Intimate Partner Violence: Not on file    Family History  Problem Relation Age of Onset   Hypertension Mother    Hyperlipidemia Mother    Heart disease Father    Ulcers Father    Bipolar disorder Brother    Colon cancer Neg Hx    Colon polyps Neg Hx    Esophageal cancer Neg Hx    Rectal cancer Neg Hx    Stomach cancer Neg Hx      ROS: no fevers or chills, productive cough, hemoptysis, dysphasia, odynophagia, melena, hematochezia, dysuria, hematuria, rash, seizure activity, orthopnea, PND, claudication. Remaining systems are negative.  Physical Exam: Well-developed well-nourished in no acute distress.  Skin is warm and dry.  HEENT is normal.  Neck is supple.  Chest is clear to auscultation with normal expansion.  Cardiovascular exam is regular rate and rhythm.  3/6 systolic murmur left sternal border.  S2 is diminished. Abdominal exam nontender or distended. No masses palpated. Extremities show 2+ edema. neuro grossly intact  A/P  1 aortic stenosis-remains moderate on most recent echocardiogram.  However he is complaining of increased dyspnea on exertion and worsening lower extremity edema.  I am concerned that aortic stenosis may be contributing but as outlined above most recent study does not suggest AS is severe.  We will plan calcium score of aortic valve to further assess.  If greater than 2000 we will arrange for patient to be seen for consideration of TAVR.  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  Increase Lasix to 80 mg twice daily.  Check potassium, renal function and BNP in 1 week.  3 paroxysmal atrial fibrillation-patient remains in sinus rhythm today.  Continue apixaban.  We will consider addition of antiarrhythmic in the future if he has more frequent episodes.  4 coronary artery disease-no chest pain.  Continue statin.  No aspirin given need for apixaban.  5 hypertension-blood pressure controlled.  Continue present medications.  6 hyperlipidemia-continue statin.  7 cirrhosis-moderate varix on previous EGD.  He has not had recent GI bleeding.  Calcium score of aortic valve personally reviewed.  Calculated at 2791 consistent with severe aortic stenosis.  Kirk Ruths, MD

## 2021-07-23 ENCOUNTER — Other Ambulatory Visit: Payer: 59

## 2021-07-23 DIAGNOSIS — K7460 Unspecified cirrhosis of liver: Secondary | ICD-10-CM

## 2021-07-24 LAB — AFP TUMOR MARKER: AFP-Tumor Marker: 2.2 ng/mL (ref <6.1)

## 2021-07-27 ENCOUNTER — Ambulatory Visit (HOSPITAL_COMMUNITY)
Admission: RE | Admit: 2021-07-27 | Discharge: 2021-07-27 | Disposition: A | Discharge status: Home / Self Care | Payer: 59 | Source: Ambulatory Visit | Attending: Gastroenterology | Admitting: Gastroenterology

## 2021-07-27 DIAGNOSIS — K7460 Unspecified cirrhosis of liver: Secondary | ICD-10-CM | POA: Diagnosis present

## 2021-07-29 ENCOUNTER — Inpatient Hospital Stay: Payer: 59 | Admitting: Internal Medicine

## 2021-07-30 ENCOUNTER — Encounter: Payer: Self-pay | Admitting: Cardiology

## 2021-07-30 ENCOUNTER — Ambulatory Visit: Payer: 59 | Admitting: Cardiology

## 2021-07-30 VITALS — BP 128/52 | HR 70 | Ht 66.0 in | Wt 206.4 lb

## 2021-07-30 DIAGNOSIS — I10 Essential (primary) hypertension: Secondary | ICD-10-CM

## 2021-07-30 DIAGNOSIS — I2510 Atherosclerotic heart disease of native coronary artery without angina pectoris: Secondary | ICD-10-CM | POA: Diagnosis not present

## 2021-07-30 DIAGNOSIS — I480 Paroxysmal atrial fibrillation: Secondary | ICD-10-CM

## 2021-07-30 DIAGNOSIS — E785 Hyperlipidemia, unspecified: Secondary | ICD-10-CM

## 2021-07-30 DIAGNOSIS — I5032 Chronic diastolic (congestive) heart failure: Secondary | ICD-10-CM | POA: Diagnosis not present

## 2021-07-30 DIAGNOSIS — I5042 Chronic combined systolic (congestive) and diastolic (congestive) heart failure: Secondary | ICD-10-CM

## 2021-07-30 DIAGNOSIS — I350 Nonrheumatic aortic (valve) stenosis: Secondary | ICD-10-CM | POA: Diagnosis not present

## 2021-07-30 MED ORDER — FUROSEMIDE 40 MG PO TABS: 80.0000 mg | ORAL_TABLET | Freq: Two times a day (BID) | ORAL | 3 refills | Status: DC

## 2021-07-30 NOTE — Patient Instructions (Signed)
Medication Instructions:   INCREASE FUROSEMIDE TO 80 MG TWICE DAILY  *If you need a refill on your cardiac medications before your next appointment, please call your pharmacy*   Lab Work:  Your physician recommends that you return for lab work in:ONE WEEK-DO NOT NEED TO FAST  If you have labs (blood work) drawn today and your tests are completely normal, you will receive your results only by: [place] (if you have MyChart) OR A paper copy in the mail If you have any lab test that is abnormal or we need to change your treatment, we will call you to review the results.   Testing/Procedures:  CORONARY CALCIUM SCORING CT SCAN AT THE [facility] OFFICE   Follow-Up: At [facility], you and your health needs are our priority.  As part of our continuing mission to provide you with exceptional heart care, we have created designated Provider Care Teams.  These Care Teams include your primary Cardiologist (physician) and Advanced Practice Providers (APPs -  Physician Assistants and Nurse Practitioners) who all work together to provide you with the care you need, when you need it.  We recommend signing up for the patient portal called "MyChart".  Sign up information is provided on this After Visit Summary.  MyChart is used to connect with patients for Virtual Visits (Telemedicine).  Patients are able to view lab/test results, encounter notes, upcoming appointments, etc.  Non-urgent messages can be sent to your provider as well.   To learn more about what you can do with MyChart, go to [email or web address].    Your next appointment:   4-6 week(s)  The format for your next appointment:   In Person  Provider:   Kirk Ruths, MD       Important Information About Sugar

## 2021-08-08 ENCOUNTER — Other Ambulatory Visit: Payer: Self-pay | Admitting: Student

## 2021-08-10 ENCOUNTER — Ambulatory Visit: Payer: 59 | Admitting: Internal Medicine

## 2021-08-10 ENCOUNTER — Encounter: Payer: Self-pay | Admitting: Internal Medicine

## 2021-08-10 VITALS — BP 110/58 | HR 67 | Temp 97.7°F | Wt 202.6 lb

## 2021-08-10 DIAGNOSIS — R7302 Impaired glucose tolerance (oral): Secondary | ICD-10-CM | POA: Diagnosis not present

## 2021-08-10 DIAGNOSIS — L03116 Cellulitis of left lower limb: Secondary | ICD-10-CM | POA: Diagnosis not present

## 2021-08-10 DIAGNOSIS — Z09 Encounter for follow-up examination after completed treatment for conditions other than malignant neoplasm: Secondary | ICD-10-CM | POA: Diagnosis not present

## 2021-08-10 LAB — POCT GLYCOSYLATED HEMOGLOBIN (HGB A1C): Hemoglobin A1C: 5.8 % — AB (ref 4.0–5.6)

## 2021-08-10 NOTE — Telephone Encounter (Signed)
Prescription refill request for Eliquis received. Indication: PAF Last office visit: 07/30/21  Thresa Ross MD Scr: 0.82 on 07/06/21 Age:  71 Weight: 93.6kg  Based on above findings Eliquis '5mg'$  twice daily is the appropriate dose.  Refill approved.

## 2021-08-10 NOTE — Progress Notes (Signed)
Established Patient Office Visit     CC/Reason for Visit: ED follow-up  HPI: [name] is a 71 y.o. male who is coming in today for the above mentioned reasons.  He was seen in the ED again on 6/15 for left lower extremity cellulitis.  He had another episode in April.  He has significant lower extremity edema, he is also noted to have dry skin.  I suspect it is itchy and he scratches causing potential portals of entry for bacteria.  He agrees with this, states he will sometimes scratch so hard it bleeds.  In any case, he was treated with doxycycline, he is now back to baseline.  Past Medical/Surgical History: Past Medical History:  Diagnosis Date   Adjustment disorder with depressed mood 09/07/2007   Cataract    removed bilaterally   Chronic kidney disease    kidney stones   Cirrhosis ([place])    Colon polyps    Tubular Adenoma 2010   CONGESTIVE HEART FAILURE 12/09/2008   CORONARY ARTERY DISEASE 2006   HYPERLIPIDEMIA 08/09/2006   HYPERTENSION 08/09/2006   MYOCARDIAL INFARCTION, HX OF 07/07/2004   NEPHROLITHIASIS, HX OF 08/09/2006   OBESITY 07/19/2008   Sleep apnea    has a cpap - not using religiously    Past Surgical History:  Procedure Laterality Date   BIOPSY  11/18/2020   Procedure: BIOPSY;  Surgeon: Yetta Flock, MD;  Location: [facility];  Service: Gastroenterology;;   CARDIOVERSION N/A 12/14/2017   Procedure: CARDIOVERSION;  Surgeon: [name], MD;  Location: [place];  Service: Cardiovascular;  Laterality: N/A;   CARDIOVERSION N/A 10/01/2019   Procedure: CARDIOVERSION;  Surgeon: Buford Dresser, MD;  Location: [place];  Service: Cardiovascular;  Laterality: N/A;   COLONOSCOPY     COLONOSCOPY WITH PROPOFOL N/A 11/18/2020   Procedure: COLONOSCOPY WITH PROPOFOL;  Surgeon: Yetta Flock, MD;  Location: [facility];  Service: Gastroenterology;  Laterality: N/A;   CORONARY ARTERY BYPASS GRAFT  2006    ESOPHAGOGASTRODUODENOSCOPY (EGD) WITH PROPOFOL N/A 11/18/2020   Procedure: ESOPHAGOGASTRODUODENOSCOPY (EGD) WITH PROPOFOL;  Surgeon: Yetta Flock, MD;  [facility]: [facility];  Service: Gastroenterology;  Laterality: N/A;   POLYPECTOMY     POLYPECTOMY  11/18/2020   Procedure: POLYPECTOMY;  Surgeon: Yetta Flock, MD;  [facility];  Service: Gastroenterology;;    Social History:  reports that he has never smoked. He has never used smokeless tobacco. He reports current alcohol use of about 1.0 standard drink of alcohol per week. He reports that he does not use drugs.  Allergies: No Known Allergies  Family History:  Family History  Problem Relation Age of Onset   Hypertension Mother    Hyperlipidemia Mother    Heart disease Father    Ulcers Father    Bipolar disorder Brother    Colon cancer Neg Hx    Colon polyps Neg Hx    Esophageal cancer Neg Hx    Rectal cancer Neg Hx    Stomach cancer Neg Hx      Current Outpatient Medications:    acetaminophen (TYLENOL) 500 MG tablet, Take 1,000 mg by mouth daily as needed for moderate pain or headache., Disp: , Rfl:    atorvastatin (LIPITOR) 80 MG tablet, TAKE 1 TABLET BY MOUTH EVERY DAY (Patient taking differently: Take 80 mg by mouth daily.), Disp: 90 tablet, Rfl: 1   carvedilol (COREG) 3.125 MG tablet, TAKE 1 TABLET BY MOUTH TWICE A DAY WITH MEALS, Disp: 60 tablet,  Rfl: 2   CVS VITAMIN B12 1000 MCG tablet, TAKE 1 TABLET BY MOUTH EVERY DAY (Patient taking differently: Take 1,000 mcg by mouth daily.), Disp: 180 tablet, Rfl: 1   ELIQUIS 5 MG TABS tablet, TAKE 1 TABLET BY MOUTH TWICE A DAY, Disp: 60 tablet, Rfl: 5   empagliflozin (JARDIANCE) 10 MG TABS tablet, Take 1 tablet (10 mg total) by mouth daily before breakfast., Disp: 30 tablet, Rfl: 11   furosemide (LASIX) 40 MG tablet, Take 2 tablets (80 mg total) by mouth 2 (two) times daily., Disp: 360 tablet, Rfl: 3   KLOR-CON M20 20 MEQ tablet, TAKE 1 TABLET BY MOUTH  EVERY DAY (Patient taking differently: Take 20 mEq by mouth daily.), Disp: 30 tablet, Rfl: 11   tamsulosin (FLOMAX) 0.4 MG CAPS capsule, TAKE ONE CAPSULE BY MOUTH EVERY DAY AFTER SUPPER (Patient taking differently: Take 0.4 mg by mouth daily after supper.), Disp: 90 capsule, Rfl: 1  Review of Systems:  Constitutional: Denies fever, chills, diaphoresis, appetite change and fatigue.  HEENT: Denies photophobia, eye pain, redness, hearing loss, ear pain, congestion, sore throat, rhinorrhea, sneezing, mouth sores, trouble swallowing, neck pain, neck stiffness and tinnitus.   Respiratory: Denies SOB, DOE, cough, chest tightness,  and wheezing.   Cardiovascular: Denies chest pain, palpitations. Gastrointestinal: Denies nausea, vomiting, abdominal pain, diarrhea, constipation, blood in stool and abdominal distention.  Genitourinary: Denies dysuria, urgency, frequency, hematuria, flank pain and difficulty urinating.  Endocrine: Denies: hot or cold intolerance, sweats, changes in hair or nails, polyuria, polydipsia. Musculoskeletal: Denies myalgias, back pain, joint swelling, arthralgias and gait problem.  Skin: Denies pallor, rash and wound.  Neurological: Denies dizziness, seizures, syncope, weakness, light-headedness, numbness and headaches.  Hematological: Denies adenopathy. Easy bruising, personal or family bleeding history  Psychiatric/Behavioral: Denies suicidal ideation, mood changes, confusion, nervousness, sleep disturbance and agitation    Physical Exam: Vitals:   08/10/21 1513  BP: (!) 110/58  Pulse: 67  Temp: 97.7 F (36.5 C)  TempSrc: Oral  SpO2: 94%  Weight: 202 lb 9.6 oz (91.9 kg)    Body mass index is 32.7 kg/m.   Constitutional: NAD, calm, comfortable Eyes: PERRL, lids and conjunctivae normal, wears corrective lenses ENMT: Mucous membranes are moist.  Skin: He has bilateral trace to 1+ pitting edema. Neurologic: Grossly intact and nonfocal Psychiatric: Normal judgment  and insight. Alert and oriented x 3. Normal mood.    Impression and Plan:  Impaired glucose tolerance - Plan: POCT glycosylated hemoglobin (Hb A1C)  Hospital discharge follow-up  Cellulitis of left lower extremity  -A1c is still in the prediabetic range at 5.8. -Cellulitis has resolved with doxycycline that he took for 10 days. -We have again discussed keeping skin moisturized and wearing compression stockings.    Time spent:30 minutes reviewing chart, interviewing and examining patient and formulating plan of care.    Lelon Frohlich, MD [facility]

## 2021-08-13 ENCOUNTER — Ambulatory Visit (HOSPITAL_BASED_OUTPATIENT_CLINIC_OR_DEPARTMENT_OTHER)
Admission: RE | Admit: 2021-08-13 | Discharge: 2021-08-13 | Disposition: A | Discharge status: Home / Self Care | Payer: 59 | Source: Ambulatory Visit | Attending: Cardiology | Admitting: Cardiology

## 2021-08-13 DIAGNOSIS — I350 Nonrheumatic aortic (valve) stenosis: Secondary | ICD-10-CM | POA: Insufficient documentation

## 2021-08-17 ENCOUNTER — Other Ambulatory Visit: Payer: Self-pay | Admitting: *Deleted

## 2021-08-17 DIAGNOSIS — J849 Interstitial pulmonary disease, unspecified: Secondary | ICD-10-CM

## 2021-08-18 ENCOUNTER — Ambulatory Visit: Payer: 59 | Admitting: Cardiovascular Disease

## 2021-08-20 ENCOUNTER — Encounter: Payer: Self-pay | Admitting: *Deleted

## 2021-09-11 NOTE — Progress Notes (Signed)
HPI: FU CAD, AS and atrial flutter/fibrillation; history of coronary artery disease, status post coronary artery bypass graft surgery performed in June 2006. Patient had a LIMA to the LAD, saphenous vein graft to the diagonal, saphenous vein graft to the acute marginal and saphenous vein graft to the PDA. Carotid Dopplers August 2015 showed no significant stenosis. Nuclear study August 2015 showed ejection fraction 55% but no ischemia or infarction. Possible mild decrease in systolic blood pressure at peak exercise.  Patient also with h/o atrial fibrillation/atrial flutter. Monitor December 2021 showed sinus rhythm with rare PAC and PVC.  Abdominal ultrasound June 2022 showed no aneurysm.  EGD October 2022 showed 1 moderate varix. Abdominal ultrasound December 2022 showed changes of cirrhosis.  Echocardiogram repeated June 2023 and showed normal LV function, severe left ventricular enlargement, mild left ventricular hypertrophy, grade 2 diastolic dysfunction, mild right ventricular enlargement, moderate biatrial enlargement, mild mitral regurgitation, moderate aortic stenosis with mean gradient 32 mmHg, aortic valve area 1.4 cm and dimensionless index 0.34.  Calcium score July 2023 showed cirrhosis with splenomegaly and findings concerning for portal venous hypertension, possible interstitial lung disease, calcium score of aortic valve 2791.  Since last seen he continues to have some dyspnea on exertion which has clearly worsened over the past 6 months.  There is no orthopnea or PND.  He has chronic lower extremity edema.  He denies chest pain or syncope.  Current Outpatient Medications  Medication Sig Dispense Refill   acetaminophen (TYLENOL) 500 MG tablet Take 1,000 mg by mouth daily as needed for moderate pain or headache.     atorvastatin (LIPITOR) 80 MG tablet TAKE 1 TABLET BY MOUTH EVERY DAY (Patient taking differently: Take 80 mg by mouth daily.) 90 tablet 1   carvedilol (COREG) 3.125 MG  tablet TAKE 1 TABLET BY MOUTH TWICE A DAY WITH MEALS 60 tablet 2   CVS VITAMIN B12 1000 MCG tablet TAKE 1 TABLET BY MOUTH EVERY DAY (Patient taking differently: Take 1,000 mcg by mouth daily.) 180 tablet 1   ELIQUIS 5 MG TABS tablet TAKE 1 TABLET BY MOUTH TWICE A DAY 60 tablet 5   empagliflozin (JARDIANCE) 10 MG TABS tablet Take 1 tablet (10 mg total) by mouth daily before breakfast. 30 tablet 11   furosemide (LASIX) 40 MG tablet Take 2 tablets (80 mg total) by mouth 2 (two) times daily. 360 tablet 3   KLOR-CON M20 20 MEQ tablet TAKE 1 TABLET BY MOUTH EVERY DAY (Patient taking differently: Take 20 mEq by mouth daily.) 30 tablet 11   tamsulosin (FLOMAX) 0.4 MG CAPS capsule TAKE ONE CAPSULE BY MOUTH EVERY DAY AFTER SUPPER (Patient taking differently: Take 0.4 mg by mouth daily after supper.) 90 capsule 1   No current facility-administered medications for this visit.     Past Medical History:  Diagnosis Date   Adjustment disorder with depressed mood 09/07/2007   Cataract    removed bilaterally   Chronic kidney disease    kidney stones   Cirrhosis ([place])    Colon polyps    Tubular Adenoma 2010   CONGESTIVE HEART FAILURE 12/09/2008   CORONARY ARTERY DISEASE 2006   HYPERLIPIDEMIA 08/09/2006   HYPERTENSION 08/09/2006   MYOCARDIAL INFARCTION, HX OF 07/07/2004   NEPHROLITHIASIS, HX OF 08/09/2006   OBESITY 07/19/2008   Sleep apnea    has a cpap - not using religiously    Past Surgical History:  Procedure Laterality Date   BIOPSY  11/18/2020   Procedure: BIOPSY;  Surgeon: Yetta Flock, MD;  Location: [facility];  Service: Gastroenterology;;   CARDIOVERSION N/A 12/14/2017   Procedure: CARDIOVERSION;  Surgeon: [name], MD;  Location: [facility];  Service: Cardiovascular;  Laterality: N/A;   CARDIOVERSION N/A 10/01/2019   Procedure: CARDIOVERSION;  Surgeon: Buford Dresser, MD;  Location: [facility];  Service: Cardiovascular;  Laterality: N/A;   COLONOSCOPY      COLONOSCOPY WITH PROPOFOL N/A 11/18/2020   Procedure: COLONOSCOPY WITH PROPOFOL;  Surgeon: Yetta Flock, MD;  Location[facility];  Service: Gastroenterology;  Laterality: N/A;   CORONARY ARTERY BYPASS GRAFT  2006   ESOPHAGOGASTRODUODENOSCOPY (EGD) WITH PROPOFOL N/A 11/18/2020   Procedure: ESOPHAGOGASTRODUODENOSCOPY (EGD) WITH PROPOFOL;  Surgeon: Yetta Flock, MD;  [facility];  Service: Gastroenterology;  Laterality: N/A;   POLYPECTOMY     POLYPECTOMY  11/18/2020   Procedure: POLYPECTOMY;  Surgeon: Yetta Flock, MD;  Location[facility];  Service: Gastroenterology;;    Social History   Socioeconomic History   Marital status: Single    Spouse name: Not on file   Number of children: 0   Years of education: Not on file   Highest education level: Not on file  Occupational History   Occupation: [employer]er, jewellery  Tobacco Use   Smoking status: Never   Smokeless tobacco: Never  Vaping Use   Vaping Use: Never used  Substance and Sexual Activity   Alcohol use: Yes    Alcohol/week: 1.0 standard drink of alcohol    Types: 1 Cans of beer per week    Comment: occasional   Drug use: No   Sexual activity: Not on file  Other Topics Concern   Not on file  Social History Narrative   Not on file   Social Determinants of Health   Financial Resource Strain: Not on file  Food Insecurity: Not on file  Transportation Needs: Not on file  Physical Activity: Not on file  Stress: Not on file  Social Connections: Not on file  Intimate Partner Violence: Not on file    Family History  Problem Relation Age of Onset   Hypertension Mother    Hyperlipidemia Mother    Heart disease Father    Ulcers Father    Bipolar disorder Brother    Colon cancer Neg Hx    Colon polyps Neg Hx    Esophageal cancer Neg Hx    Rectal cancer Neg Hx    Stomach cancer Neg Hx     ROS: no fevers or chills, productive cough, hemoptysis, dysphasia,  odynophagia, melena, hematochezia, dysuria, hematuria, rash, seizure activity, orthopnea, PND, claudication. Remaining systems are negative.  Physical Exam: Well-developed well-nourished in no acute distress.  Skin is warm and dry.  HEENT is normal.  Neck is supple.  Chest is clear to auscultation with normal expansion.  Cardiovascular exam is regular rate and rhythm.  3/6 systolic murmur left sternal border.  S2 is not diminished. Abdominal exam nontender or distended. No masses palpated. Extremities show 1-2+ edema. neuro grossly intact  A/P  1 aortic stenosis-as outlined in previous notes echocardiogram suggest moderate aortic stenosis.  However recent calcium score of aortic valve 2791.  This suggest aortic stenosis is severe.  Patient does have increased dyspnea on exertion.  However recent lung images at time of calcium score suggested possible interstitial lung disease.  He has been seen by pulmonary and rheumatologic labs are in process, high-resolution CT scan ordered and pulmonary function tests pending.  I am concerned that  some of his dyspnea is related to pulmonary fibrosis.  We will await full evaluation.  If he does not have pulmonary fibrosis then will likely need right and left cardiac catheterization and consideration of TAVR pending those results.  If he does have pulmonary fibrosis we will plan follow-up echoes and proceed with TAVR when aortic stenosis is severe based on echocardiographic criteria.  2 chronic combined systolic/diastolic congestive heart failure-continue diuretics at present dose.  Continue Jardiance.  3 history of paroxysmal atrial fibrillation-continue apixaban.  Patient has been seen by gastroenterology and is also noted to have moderate varix on previous EGD.  I think we will likely need to avoid anticoagulation in the future.  I have discussed watchman with patient and he is agreeable to evaluation.  We will consider addition of antiarrhythmic in the  future if he has more frequent episodes of atrial fibrillation.   [facility] Referral for Left Atrial Appendage Closure with Non-Valvular Atrial Fibrillation   NICKOLAI RINKS is a 71 y.o. male is being referred to the [facility] Team for evaluation for Left Atrial Appendage Closure with Watchman device for the management of stroke risk resulting form non-valvular atrial fibrillation.    Base upon Mr. Todorov history, he is felt to be a poor candidate for long-term anticoagulation because of cirrhosis, varices and increased risk of bleeding.  The patient has a HAS-BLED score of 4 indicating a Yearly Major Bleeding Risk of 8.7%.  His CHADS2-VASc Score is 4 with an unadjusted Ischemic Stroke Rate (% per year) of 4.8%.  His stroke risk necessitates a strategy of stroke prevention with either long-term oral anticoagulation or left atrial appendage occlusion therapy. We have discussed their bleeding risk in the context of their comorbid medical problems, as well as the rationale for referral for evaluation of Watchman left atrial appendage occlusion therapy. While the patient is at high long-term bleeding risk, they may be appropriate for short-term anticoagulation. Based on this individual patient's stroke and bleeding risk, a shared decision has been made to refer the patient for consideration of Watchman left atrial appendage closure utilizing the [employer] of Cardiology shared decision tool.   4 coronary artery disease-patient denies chest pain.  Continue statin.  He is not on aspirin given need for apixaban.  5 hypertension-blood pressure controlled.  Continue present medical regimen and follow.  6 hyperlipidemia-continue statin.  7 history of cirrhosis-moderate varix on previous EGD.  Now being evaluated by gastroenterology.  8 possible interstitial lung disease-being evaluated by pulmonary with serologies, pulmonary function tests and  chest CT ordered.  Kirk Ruths, MD

## 2021-09-23 ENCOUNTER — Encounter: Payer: Self-pay | Admitting: Pulmonary Disease

## 2021-09-23 ENCOUNTER — Ambulatory Visit: Payer: 59 | Admitting: Pulmonary Disease

## 2021-09-23 VITALS — BP 128/60 | HR 65 | Temp 98.5°F | Ht 66.0 in | Wt 202.0 lb

## 2021-09-23 DIAGNOSIS — J849 Interstitial pulmonary disease, unspecified: Secondary | ICD-10-CM | POA: Diagnosis not present

## 2021-09-23 NOTE — Patient Instructions (Signed)
We will get some labs for additional work-up of interstitial lung disease We will schedule high-res CT and PFTs Follow-up in clinic in 2 to 3 months

## 2021-09-23 NOTE — Progress Notes (Signed)
[name]    [id number]    Jun 08, 1950  Primary Care Physician:Hernandez Everardo Beals, MD  Referring Physician: Isaac Bliss, Rayford Halsted, MD [address] [place],  [place] [id number]  Chief complaint: Consult for interstitial lung disease  HPI: 71 y.o. who has past medical history of hypertension, coronary artery disease, paroxysmal atrial fibrillation on Eliquis anticoagulation, aortic stenosis, sleep apnea on CPAP, cirrhosis with varices secondary to [facility]  Referred for evaluation of dyspnea on exertion for several years.  He recently had a cardiac CT which showed interstitial changes Denies any cough, chest congestion.  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   atorvastatin (LIPITOR) 80 MG tablet TAKE 1 TABLET BY MOUTH EVERY DAY (Patient taking differently: Take 80 mg by mouth daily.)   carvedilol (COREG) 3.125 MG tablet TAKE 1 TABLET BY MOUTH TWICE A DAY WITH MEALS   CVS VITAMIN B12 1000 MCG tablet TAKE 1 TABLET BY MOUTH EVERY DAY (Patient taking differently: Take 1,000 mcg by mouth daily.)   ELIQUIS 5 MG TABS tablet TAKE 1 TABLET BY MOUTH TWICE A DAY   empagliflozin (JARDIANCE) 10 MG TABS tablet Take 1 tablet (10 mg total) by mouth daily before breakfast.   furosemide (LASIX) 40 MG tablet Take 2 tablets (80 mg total) by mouth 2 (two) times daily.   KLOR-CON M20 20 MEQ tablet TAKE 1 TABLET BY MOUTH EVERY DAY (Patient taking differently: Take 20 mEq by mouth daily.)   tamsulosin (FLOMAX) 0.4 MG CAPS  capsule TAKE ONE CAPSULE BY MOUTH EVERY DAY AFTER SUPPER (Patient taking differently: Take 0.4 mg by mouth daily after supper.)   No facility-administered encounter medications on file as of 09/23/2021.    Allergies as of 09/23/2021   (No Known Allergies)    Past Medical History:  Diagnosis Date   Adjustment disorder with depressed mood 09/07/2007   Cataract    removed bilaterally   Chronic kidney disease    kidney stones   Cirrhosis ([place])    Colon polyps    Tubular Adenoma 2010   CONGESTIVE HEART FAILURE 12/09/2008   CORONARY ARTERY DISEASE 2006   HYPERLIPIDEMIA 08/09/2006   HYPERTENSION 08/09/2006   MYOCARDIAL INFARCTION, HX OF 07/07/2004   NEPHROLITHIASIS, HX OF 08/09/2006   OBESITY 07/19/2008   Sleep apnea    has a cpap - not using religiously    Past Surgical History:  Procedure Laterality Date   BIOPSY  11/18/2020   Procedure: BIOPSY;  Surgeon: Yetta Flock, MD;  Location: [facility];  Service: Gastroenterology;;   CARDIOVERSION N/A 12/14/2017   Procedure: CARDIOVERSION;  Surgeon: [name], MD;  Location: [place];  Service: Cardiovascular;  Laterality: N/A;   CARDIOVERSION N/A 10/01/2019   Procedure: CARDIOVERSION;  Surgeon: Buford [facility]er, MD;  Location: [place];  Service: Cardiovascular;  Laterality: N/A;   COLONOSCOPY     COLONOSCOPY WITH PROPOFOL N/A 11/18/2020   Procedure: COLONOSCOPY WITH PROPOFOL;  Surgeon: Yetta Flock, MD;  Location[facility];  Service: Gastroenterology;  Laterality: N/A;   CORONARY ARTERY BYPASS GRAFT  2006   ESOPHAGOGASTRODUODENOSCOPY (EGD) WITH PROPOFOL N/A 11/18/2020   Procedure: ESOPHAGOGASTRODUODENOSCOPY (EGD) WITH PROPOFOL;  Surgeon: Yetta Flock, MD;  [facility];  Service: Gastroenterology;  Laterality: N/A;   POLYPECTOMY     POLYPECTOMY  11/18/2020   Procedure: POLYPECTOMY;  Surgeon: Yetta Flock, MD;  [facility];  Service: Gastroenterology;;     Family History  Problem Relation Age of Onset   Hypertension Mother    Hyperlipidemia Mother    Heart disease Father    Ulcers Father    Bipolar disorder Brother    Colon cancer Neg Hx    Colon polyps Neg Hx    Esophageal cancer Neg Hx    Rectal cancer Neg Hx    Stomach cancer Neg Hx     Social History   Socioeconomic History   Marital status: Single    Spouse name: Not on file   Number of children: 0   Years of education: Not on file   Highest education level: Not on file  Occupational History   Occupation: Passenger transport manager  Tobacco Use   Smoking status: Never   Smokeless tobacco: Never  Vaping Use   Vaping Use: Never used  Substance and Sexual Activity   Alcohol use: Yes    Alcohol/week: 1.0 standard drink of alcohol    Types: 1 Cans of beer per week    Comment: occasional   Drug use: No   Sexual activity: Not on file  Other Topics Concern   Not on file  Social History Narrative   Not on file   Social Determinants of Health   Financial Resource Strain: Not on file  Food Insecurity: Not on file  Transportation Needs: Not on file  Physical Activity: Not on file  Stress: Not on file  Social Connections: Not on file  Intimate Partner Violence: Not on file    Review of systems: Review of Systems  Constitutional: Negative for fever and chills.  HENT: Negative.   Eyes: Negative for blurred vision.  Respiratory: as per HPI  Cardiovascular: Negative for chest pain and palpitations.  Gastrointestinal: Negative for vomiting, diarrhea, blood per rectum. Genitourinary: Negative for dysuria, urgency, frequency and hematuria.  Musculoskeletal: Negative for myalgias, back pain and joint pain.  Skin: Negative for itching and rash.  Neurological: Negative for dizziness, tremors, focal weakness, seizures and loss of consciousness.  Endo/Heme/Allergies: Negative for environmental allergies.  Psychiatric/Behavioral: Negative for depression,  suicidal ideas and hallucinations.  All other systems reviewed and are negative.  Physical Exam: Blood pressure 128/60, pulse 65, temperature 98.5 F (36.9 C), temperature source Oral, height '5\' 6"'$  (1.676 m), weight 202 lb (91.6 kg), SpO2 93 %. Gen:      No acute distress HEENT:  EOMI, sclera anicteric Neck:     No masses; no thyromegaly Lungs:    Clear to auscultation bilaterally; normal respiratory effort CV:         Regular rate and rhythm; systolic murmur Abd:      + bowel sounds; soft, non-tender; no palpable masses, no distension Ext:    No edema; adequate peripheral perfusion Skin:      Warm and dry; no rash Neuro: alert and oriented x 3 Psych: normal mood and affect  Data Reviewed: Imaging: Cardiac CT 08/15/2021 Cirrhosis with splenomegaly, aortic atherosclerosis, peripheral areas of groundglass attenuation, septal thickening with basilar predominance.  I have reviewed the images personally.  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 Does not have any significant exposures or signs and symptoms of connective tissue disease. We will get baseline CTD serologies, high-res CT and PFTs for further evaluation Return to clinic for this test for reevaluation and plan for next steps.  Plan/Recommendations: CTD serologies, high-res CT, PFTs  Marshell Garfinkel MD [facility] 09/23/2021, 3:23 PM  CC: Isaac Bliss, [place]*

## 2021-09-24 ENCOUNTER — Ambulatory Visit: Payer: 59 | Admitting: Cardiology

## 2021-09-24 ENCOUNTER — Ambulatory Visit (INDEPENDENT_AMBULATORY_CARE_PROVIDER_SITE_OTHER): Payer: 59 | Admitting: Gastroenterology

## 2021-09-24 ENCOUNTER — Encounter: Payer: Self-pay | Admitting: Gastroenterology

## 2021-09-24 ENCOUNTER — Encounter: Payer: Self-pay | Admitting: Cardiology

## 2021-09-24 VITALS — BP 118/50 | HR 79 | Ht 66.0 in | Wt 201.4 lb

## 2021-09-24 VITALS — BP 132/85 | HR 70 | Ht 66.0 in | Wt 202.8 lb

## 2021-09-24 DIAGNOSIS — I350 Nonrheumatic aortic (valve) stenosis: Secondary | ICD-10-CM

## 2021-09-24 DIAGNOSIS — I480 Paroxysmal atrial fibrillation: Secondary | ICD-10-CM | POA: Diagnosis not present

## 2021-09-24 DIAGNOSIS — I10 Essential (primary) hypertension: Secondary | ICD-10-CM

## 2021-09-24 DIAGNOSIS — Z86010 Personal history of colonic polyps: Secondary | ICD-10-CM

## 2021-09-24 DIAGNOSIS — I2510 Atherosclerotic heart disease of native coronary artery without angina pectoris: Secondary | ICD-10-CM

## 2021-09-24 DIAGNOSIS — K7460 Unspecified cirrhosis of liver: Secondary | ICD-10-CM | POA: Diagnosis not present

## 2021-09-24 DIAGNOSIS — I5032 Chronic diastolic (congestive) heart failure: Secondary | ICD-10-CM

## 2021-09-24 DIAGNOSIS — I8500 Esophageal varices without bleeding: Secondary | ICD-10-CM | POA: Diagnosis not present

## 2021-09-24 NOTE — Progress Notes (Signed)
HPI :  71 year old male with a history of colon polyps, aortic stenosis, CHF, A. fib, on Eliquis, cirrhosis, history of colon polyps, interstitial lung disease, here for a follow up visit. Last seen August 2022.  At her visit in August he was recently diagnosed with cirrhosis during work-up for thrombocytopenia.  He had lab work done to evaluate for chronic liver diseases which was negative for clear cause.  Patient denied any history of significant alcohol use.  He had an elevated IgG level but otherwise negative serologic work-up for autoimmune hepatitis.  I had spoken with Dr. Lorenso Courier his hematologist about the elevated IgG, led to SPEP and light chain testing which did not show any significant changes and was thought to be benign.  Patient reports he used to weigh 260 pounds and has been having his entire life, suspect perhaps he has underlying cirrhosis from fatty liver disease.  Since our last visit he had an EGD with me and was found to have 1 column of moderate varix without high risk stigmata, otherwise small varices as well.  We switched his metoprolol to Coreg and he is tolerating that well.  Remains on Eliquis for A-fib and has not had any bleeding.  He has never had any overt decompensations to date.  His bilirubin is been 2.6 in April, AST 44, ALT 24, alk phos 58.  Platelet count of 82.  1.7 however he is on Eliquis.  He otherwise has no jaundice, no ascites, no history of encephalopathy.  [place] screening up-to-date with an ultrasound in June 2023.  Unfortunately since have seen him he was evaluated by pulmonary and appears to have interstitial lung disease.  His rheumatoid factor is markedly positive.  He has not been drinking any alcohol, denies any routine NSAID use.  He was vaccinated hepatitis A and B since our last visit.  Otherwise recall he had a colonoscopy with me and 2017 at which point time he had 13 adenomas removed.  I had recommended a colonoscopy 1 year later.  In January 2019  he had a follow-up exam which was normal.   He denies any problems with his bowels at present time.  At the time of his upper endoscopy he had a colonoscopy with me and had 3 small polyps removed, consideration for another colonoscopy in 5 years.  No high risk lesions.      Echo 06/06/20 - EF 60-65%, grade III DD (restrictive), moderate to severe AS     Colonoscopy 02/22/2017 - Diverticulosis in the entire examined colon. - Medium-sized lipoma in the ascending colon. - Internal hemorrhoids. - There was significant looping of the colon. - The examination was otherwise normal. - No specimens collected.   Colonoscopy 09/24/2015 - One 5 mm polyp in the cecum, removed with a hot snare. Resected and retrieved. - Two 4 to 5 mm polyps in the ascending colon, removed with a cold snare. Resected and retrieved. - Two 4 to 5 mm polyps at the hepatic flexure, removed with a cold snare. Resected and retrieved. - Two 4 to 5 mm polyps in the transverse colon, removed with a cold snare. Resected and retrieved. - Two 4 to 5 mm polyps in the descending colon, removed with a cold snare. Resected and retrieved. - Three 3 to 5 mm polyps in the sigmoid colon, removed with a cold snare. Resected and retrieved. - One 6 mm polyp at the recto-sigmoid colon, removed with a hot snare. Resected and retrieved. - Diverticulosis in the entire  examined colon. - Non-bleeding internal hemorrhoids. - The examination was otherwise normal.     Surgical [P], sigmoid, descending, hepatic flexure, cecum, ascending, transverse, rectosigmoid, polyp (13) - TUBULAR ADENOMA (9), SESSILE SERRATED ADENOMA (3) AND HYPERPLASTIC POLYP (1). NO HIGH GRADE DYSPLASIA OR MALIGNANCY IDENTIFIED.   EGD 11/18/20: Esophagogastric landmarks identified. - 1 cm hiatal hernia. - One moderate varix, other small columns which flattened. - Normal esophagus otherwise - Erosive gastropathy with no stigmata of recent bleeding. - Normal stomach  otherwise. Biopsies taken to rule out H pylori - Duodenal erosions. - Normal duodenum otherwise.  (8/17 - 13 adenomas, 1/19 - normal) Colonoscopy 11/18/20: One diminutive polyp at the ileocecal valve, removed with a cold snare. Resected and retrieved. - One 4 mm polyp at the hepatic flexure, removed with a cold snare. Resected and retrieved. - One 3 mm polyp in the transverse colon, removed with a cold snare. Resected and retrieved. - Diverticulosis in the entire examined colon. - Rectal varices. - Internal hemorrhoids. - The examination was otherwise normal.  FINAL MICROSCOPIC DIAGNOSIS:   A. STOMACH, BIOPSY:  - Unremarkable gastric mucosa.  - Warthin-Starry negative for Helicobacter pylori.   B. COLON, IC VALVE, HEPATIC FLEXURE, TRANSVERSE, POLYPECTOMY:  - Tubular adenoma without high-grade dysplasia.  - Sessile serrated adenoma without cytologic dysplasia.   Switched metoprolol to Coreg and repeat colon in 5 years   RUQ [place] 07/27/21: IMPRESSION: 1. Gallstones and sludge in the gallbladder. No sonographic evidence of acute cholecystitis. 2. No focal liver lesion noted. Coarsened echotexture of the liver noted 3. No flow identified in the portal vein unchanged compared to prior ultrasound.    Past Medical History:  Diagnosis Date   Adjustment disorder with depressed mood 09/07/2007   Cataract    removed bilaterally   Chronic kidney disease    kidney stones   Cirrhosis ([place])    Colon polyps    Tubular Adenoma 2010   CONGESTIVE HEART FAILURE 12/09/2008   CORONARY ARTERY DISEASE 2006   HYPERLIPIDEMIA 08/09/2006   HYPERTENSION 08/09/2006   MYOCARDIAL INFARCTION, HX OF 07/07/2004   NEPHROLITHIASIS, HX OF 08/09/2006   OBESITY 07/19/2008   Sleep apnea    has a cpap - not using religiously     Past Surgical History:  Procedure Laterality Date   BIOPSY  11/18/2020   Procedure: BIOPSY;  Surgeon: Yetta Flock, MD;  Location: [facility];  Service:  Gastroenterology;;   CARDIOVERSION N/A 12/14/2017   Procedure: CARDIOVERSION;  Surgeon: [name], MD;  Location: [facility];  Service: Cardiovascular;  Laterality: N/A;   CARDIOVERSION N/A 10/01/2019   Procedure: CARDIOVERSION;  Surgeon: Buford Dresser, MD;  Location: [facility];  Service: Cardiovascular;  Laterality: N/A;   COLONOSCOPY     COLONOSCOPY WITH PROPOFOL N/A 11/18/2020   Procedure: COLONOSCOPY WITH PROPOFOL;  Surgeon: Yetta Flock, MD;  Location: [facility];  Service: Gastroenterology;  Laterality: N/A;   CORONARY ARTERY BYPASS GRAFT  2006   ESOPHAGOGASTRODUODENOSCOPY (EGD) WITH PROPOFOL N/A 11/18/2020   Procedure: ESOPHAGOGASTRODUODENOSCOPY (EGD) WITH PROPOFOL;  Surgeon: Yetta Flock, MD;  Location: [facility];  Service: Gastroenterology;  Laterality: N/A;   POLYPECTOMY     POLYPECTOMY  11/18/2020   Procedure: POLYPECTOMY;  Surgeon: Yetta Flock, MD;  Location: [facility];  Service: Gastroenterology;;   Family History  Problem Relation Age of Onset   Hypertension Mother    Hyperlipidemia Mother    Heart disease Father    Ulcers Father    Bipolar disorder Brother  Colon cancer Neg Hx    Colon polyps Neg Hx    Esophageal cancer Neg Hx    Rectal cancer Neg Hx    Stomach cancer Neg Hx    Social History   Tobacco Use   Smoking status: Never   Smokeless tobacco: Never  Vaping Use   Vaping Use: Never used  Substance Use Topics   Alcohol use: Yes    Alcohol/week: 1.0 standard drink of alcohol    Types: 1 Cans of beer per week    Comment: occasional   Drug use: No   Current Outpatient Medications  Medication Sig Dispense Refill   acetaminophen (TYLENOL) 500 MG tablet Take 1,000 mg by mouth daily as needed for moderate pain or headache.     atorvastatin (LIPITOR) 80 MG tablet TAKE 1 TABLET BY MOUTH EVERY DAY (Patient taking differently: Take 80 mg by mouth daily.) 90 tablet 1   carvedilol (COREG) 3.125 MG tablet  TAKE 1 TABLET BY MOUTH TWICE A DAY WITH MEALS 60 tablet 2   [employer] VITAMIN B12 1000 MCG tablet TAKE 1 TABLET BY MOUTH EVERY DAY (Patient taking differently: Take 1,000 mcg by mouth daily.) 180 tablet 1   ELIQUIS 5 MG TABS tablet TAKE 1 TABLET BY MOUTH TWICE A DAY 60 tablet 5   empagliflozin (JARDIANCE) 10 MG TABS tablet Take 1 tablet (10 mg total) by mouth daily before breakfast. 30 tablet 11   furosemide (LASIX) 40 MG tablet Take 2 tablets (80 mg total) by mouth 2 (two) times daily. 360 tablet 3   KLOR-CON M20 20 MEQ tablet TAKE 1 TABLET BY MOUTH EVERY DAY (Patient taking differently: Take 20 mEq by mouth daily.) 30 tablet 11   tamsulosin (FLOMAX) 0.4 MG CAPS capsule TAKE ONE CAPSULE BY MOUTH EVERY DAY AFTER SUPPER (Patient taking differently: Take 0.4 mg by mouth daily after supper.) 90 capsule 1   No current facility-administered medications for this visit.   No Known Allergies   Review of Systems: All systems reviewed and negative except where noted in HPI.   Lab Results  Component Value Date   WBC 5.5 05/20/2021   HGB 11.7 (L) 05/20/2021   HCT 35.7 (L) 05/20/2021   MCV 94.2 05/20/2021   PLT 82 (L) 05/20/2021    Lab Results  Component Value Date   CREATININE 0.82 07/06/2021   BUN 12 07/06/2021   NA 140 07/06/2021   K 3.9 07/06/2021   CL 105 07/06/2021   CO2 21 07/06/2021    Lab Results  Component Value Date   ALT 24 05/20/2021   AST 44 (H) 05/20/2021   ALKPHOS 58 05/20/2021   BILITOT 2.6 (H) 05/20/2021    Lab Results  Component Value Date   INR 1.7 (H) 05/20/2021   INR 1.7 (H) 09/24/2020     Physical Exam: BP (!) 118/50   Pulse 79   Ht _0  (1.676 m)   Wt 201 lb 6 oz (91.3 kg)   BMI 32.50 kg/m  Constitutional: Pleasant,well-developed, male in no acute distress. Neurological: Alert and oriented to person place and time. Psychiatric: Normal mood and affect. Behavior is normal.   ASSESSMENT: 71 y.o. male here for assessment of the following  1.  Cirrhosis of liver without ascites, unspecified hepatic cirrhosis type ([place])   2. Esophageal varices without bleeding, unspecified esophageal varices type ([place])   3. History of colon polyps    Patient with multiple comorbidities with recent diagnosis of cirrhosis.  Serologic work-up without clear etiology.  Suspect this most likely is due to fatty liver in the setting of longstanding obesity.  We discussed the only way to confirm this would be with a liver biopsy, I do not think it would change his management, we discussed this and he does not want a liver biopsy.  He is currently compensated but has significant comorbidities and at high risk for bleeding with being on chronic anticoagulation.  He does have varices although there is no high risk stigmata, on Coreg which will hopefully reduce his bleeding risk.  He is very high risk for anesthesia, banding his varices he be very high risk for bleeding without holding his Eliquis, I think the risks of additional endoscopy probably outweigh the benefits and will continue Coreg for now.  He is due for [facility] screening again in December, and LFTs in October.  We discussed that he is at risk for decompensation moving forward and will let me know if he has any symptoms that bother him.  He also understands he is at higher risk for [facility].  Regarding his colon polyps, his last colonoscopy showed no high risk lesions.  Given his comorbidities risks of future colonoscopies likely outweigh benefits although we will see how he feels in 5 years when he would not be due.  PLAN: - recall RUQ [place] in December - AFP in December - LFTs in October - avoid alcohol and NSAIDs - continue Coreg, discussed options for varices management as above - follow up 6 months  Jolly Mango, MD [facility]

## 2021-09-24 NOTE — Patient Instructions (Signed)
Medication Instructions:  No changes *If you need a refill on your cardiac medications before your next appointment, please call your pharmacy*   Lab Work: None ordered If you have labs (blood work) drawn today and your tests are completely normal, you will receive your results only by: [place] (if you have MyChart) OR A paper copy in the mail If you have any lab test that is abnormal or we need to change your treatment, we will call you to review the results.   Testing/Procedures: None ordered   Follow-Up: At [facility], you and your health needs are our priority.  As part of our continuing mission to provide you with exceptional heart care, we have created designated Provider Care Teams.  These Care Teams include your primary Cardiologist (physician) and Advanced Practice Providers (APPs -  Physician Assistants and Nurse Practitioners) who all work together to provide you with the care you need, when you need it.  We recommend signing up for the patient portal called "MyChart".  Sign up information is provided on this After Visit Summary.  MyChart is used to connect with patients for Virtual Visits (Telemedicine).  Patients are able to view lab/test results, encounter notes, upcoming appointments, etc.  Non-urgent messages can be sent to your provider as well.   To learn more about what you can do with MyChart, go to [email or web address].    Your next appointment:   3 month(s)  The format for your next appointment:   In Person  Provider:   Kirk Ruths, MD {   Other Instructions A referral has been placed to Electrophysiology to discuss a possible watchman

## 2021-09-24 NOTE — Patient Instructions (Addendum)
You will need repeat labs in October. Please go directly to our lab in the basement. You do not need an appt. The lab is open from 7:30am-5:00pm.   We will also contact you in December to have you come in for labs and a ultrasound.   Continue Coreg.   Please avoid all NSAID's. Some examples of NSAID's are as follows: Aspirin (Bufferin, Bayer, and Excedrin) Ibuprofen (Advil, Motrin, Nuprin) Ketoprofen (Actron, Orudis) Naproxen (Aleve) Daypro  Indocin  Lodine  Naprosyn  Relafen  Vimovo Voltaren  Also avoid alcohol.   The [place] [facility] providers would like to encourage you to use [facility] to communicate with providers for non-urgent requests or questions.  Due to long hold times on the telephone, sending your provider a message by [facility] may be a faster and more efficient way to get a response.  Please allow 48 business hours for a response.  Please remember that this is for non-urgent requests.

## 2021-09-25 LAB — ANTI-DNA ANTIBODY, DOUBLE-STRANDED: ds DNA Ab: 1 IU/mL

## 2021-09-25 LAB — CYCLIC CITRUL PEPTIDE ANTIBODY, IGG: Cyclic Citrullin Peptide Ab: <16 UNITS

## 2021-09-25 LAB — RHEUMATOID FACTOR: Rheumatoid fact SerPl-aCnc: 382 IU/mL — ABNORMAL HIGH (ref <14)

## 2021-09-25 LAB — ANA: Anti Nuclear Antibody (ANA): NEGATIVE

## 2021-10-01 ENCOUNTER — Other Ambulatory Visit: Payer: Self-pay | Admitting: *Deleted

## 2021-10-01 DIAGNOSIS — R768 Other specified abnormal immunological findings in serum: Secondary | ICD-10-CM

## 2021-10-02 ENCOUNTER — Other Ambulatory Visit (INDEPENDENT_AMBULATORY_CARE_PROVIDER_SITE_OTHER): Payer: 59

## 2021-10-02 DIAGNOSIS — I8500 Esophageal varices without bleeding: Secondary | ICD-10-CM

## 2021-10-02 DIAGNOSIS — K7460 Unspecified cirrhosis of liver: Secondary | ICD-10-CM

## 2021-10-02 LAB — HEPATIC FUNCTION PANEL
ALT: 16 U/L (ref 0–53)
AST: 28 U/L (ref 0–37)
Albumin: 3.1 g/dL — ABNORMAL LOW (ref 3.5–5.2)
Alkaline Phosphatase: 69 U/L (ref 39–117)
Bilirubin, Direct: 0.6 mg/dL — ABNORMAL HIGH (ref 0.0–0.3)
Total Bilirubin: 2.7 mg/dL — ABNORMAL HIGH (ref 0.2–1.2)
Total Protein: 7.3 g/dL (ref 6.0–8.3)

## 2021-10-03 ENCOUNTER — Other Ambulatory Visit: Payer: Self-pay | Admitting: Internal Medicine

## 2021-10-26 ENCOUNTER — Ambulatory Visit (HOSPITAL_COMMUNITY)
Admission: RE | Admit: 2021-10-26 | Discharge: 2021-10-26 | Disposition: A | Discharge status: Home / Self Care | Payer: 59 | Source: Ambulatory Visit | Attending: Pulmonary Disease | Admitting: Pulmonary Disease

## 2021-10-26 DIAGNOSIS — J849 Interstitial pulmonary disease, unspecified: Secondary | ICD-10-CM | POA: Insufficient documentation

## 2021-10-28 ENCOUNTER — Other Ambulatory Visit: Payer: Self-pay | Admitting: Gastroenterology

## 2021-10-29 ENCOUNTER — Encounter: Payer: Self-pay | Admitting: Cardiovascular Disease

## 2021-10-29 ENCOUNTER — Ambulatory Visit: Payer: 59 | Attending: Cardiovascular Disease | Admitting: Cardiovascular Disease

## 2021-10-29 VITALS — BP 128/58 | HR 67 | Ht 66.0 in | Wt 210.4 lb

## 2021-10-29 DIAGNOSIS — I480 Paroxysmal atrial fibrillation: Secondary | ICD-10-CM

## 2021-10-29 DIAGNOSIS — Z01818 Encounter for other preprocedural examination: Secondary | ICD-10-CM | POA: Diagnosis not present

## 2021-10-29 NOTE — Patient Instructions (Signed)
Medication Instructions:  Your physician recommends that you continue on your current medications as directed. Please refer to the Current Medication list given to you today.  *If you need a refill on your cardiac medications before your next appointment, please call your pharmacy*   Lab Work: BMET today If you have labs (blood work) drawn today and your tests are completely normal, you will receive your results only by: [place] (if you have MyChart) OR A paper copy in the mail If you have any lab test that is abnormal or we need to change your treatment, we will call you to review the results.   Testing/Procedures: TAVR CT's for watchman work-up (you will be contacted to schedule)   Follow-Up: At [facility], you and your health needs are our priority.  As part of our continuing mission to provide you with exceptional heart care, we have created designated Provider Care Teams.  These Care Teams include your primary Cardiologist (physician) and Advanced Practice Providers (APPs -  Physician Assistants and Nurse Practitioners) who all work together to provide you with the care you need, when you need it.  Your next appointment:   Structural Team will follow-up  The format for your next appointment:   In Person  Provider:   [name], MD      Important Information About Sugar

## 2021-10-29 NOTE — Progress Notes (Signed)
Watchman Consult Note   Date:  10/29/2021   ID:  Tony, Foster 02-18-1950, MRN [id number]  PCP:  Isaac Bliss, Rayford Halsted, MD  Cardiologist:  Dr Stanford Breed Primary Electrophysiologist: none Referring Physician: Dr Stanford Breed   CC: to discuss Watchman implant    History of Present Illness: [name] is a 71 y.o. male referred by Dr Stanford Breed for evaluation of atrial fibrillation and stroke prevention. He has paroxysmal atrial fibrillation.  The patient has been evaluated by their referring physician and is felt to be a poor candidate for long term [place] due to cirrhosis.  He therefore presents today for Watchman evaluation.   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  He is felt to be at high bleeding risk and is referred for [facility] evaluation.  He did not have esophageal varices.  He does have thrombocytopenia with a platelet count of 82,000.  The patient has exertional dyspnea.  He has moderate aortic stenosis and also has pulmonary fibrosis.  He is currently being worked up by pulmonary medicine, and has undergone high-resolution CT scanning.  He is pending PFTs and serologies.  The patient has not required home oxygen and he can do most of his normal activities without too much trouble.  He is short of breath with any moderate level activity.  States that he is still able to play golf.  He denies chest pain or pressure, heart palpitations, orthopnea, or PND.  He has chronic bilateral leg swelling.     Past Medical History:  Diagnosis Date   Adjustment disorder with depressed mood 09/07/2007   Cataract    removed bilaterally   Chronic kidney disease    kidney stones   Cirrhosis ([place])    Colon polyps    Tubular Adenoma 2010   CONGESTIVE HEART FAILURE 12/09/2008   CORONARY ARTERY DISEASE 2006   HYPERLIPIDEMIA 08/09/2006   HYPERTENSION 08/09/2006   MYOCARDIAL INFARCTION, HX OF 07/07/2004   NEPHROLITHIASIS, HX  OF 08/09/2006   OBESITY 07/19/2008   Sleep apnea    has a cpap - not using religiously   Past Surgical History:  Procedure Laterality Date   BIOPSY  11/18/2020   Procedure: BIOPSY;  Surgeon: Yetta Flock, MD;  Location: [facility];  Service: Gastroenterology;;   CARDIOVERSION N/A 12/14/2017   Procedure: CARDIOVERSION;  Surgeon: [name], MD;  Location: [place];  Service: Cardiovascular;  Laterality: N/A;   CARDIOVERSION N/A 10/01/2019   Procedure: CARDIOVERSION;  Surgeon: Buford Dresser, MD;  Location: [place];  Service: Cardiovascular;  Laterality: N/A;   COLONOSCOPY     COLONOSCOPY WITH PROPOFOL N/A 11/18/2020   Procedure: COLONOSCOPY WITH PROPOFOL;  Surgeon: Yetta Flock, MD;  Location: [facility];  Service: Gastroenterology;  Laterality: N/A;   CORONARY ARTERY BYPASS GRAFT  2006   ESOPHAGOGASTRODUODENOSCOPY (EGD) WITH PROPOFOL N/A 11/18/2020   Procedure: ESOPHAGOGASTRODUODENOSCOPY (EGD) WITH PROPOFOL;  Surgeon: Yetta Flock, MD;  Location: [facility];  Service: Gastroenterology;  Laterality: N/A;   POLYPECTOMY     POLYPECTOMY  11/18/2020   Procedure: POLYPECTOMY;  Surgeon: Yetta Flock, MD;  Location: [facility];  Service: Gastroenterology;;     Current Outpatient Medications  Medication Sig Dispense Refill   acetaminophen (TYLENOL) 500 MG tablet Take 1,000 mg by mouth daily as needed for moderate pain or headache.     atorvastatin (LIPITOR) 80 MG tablet TAKE 1 TABLET BY MOUTH EVERY DAY 90 tablet 1   carvedilol (COREG) 3.125 MG  tablet TAKE 1 TABLET BY MOUTH TWICE A DAY WITH FOOD 60 tablet 5   CVS VITAMIN B12 1000 MCG tablet TAKE 1 TABLET BY MOUTH EVERY DAY 180 tablet 1   ELIQUIS 5 MG TABS tablet TAKE 1 TABLET BY MOUTH TWICE A DAY 60 tablet 5   empagliflozin (JARDIANCE) 10 MG TABS tablet Take 1 tablet (10 mg total) by mouth daily before breakfast. 30 tablet 11   furosemide (LASIX) 40 MG tablet Take 2 tablets (80 mg  total) by mouth 2 (two) times daily. 360 tablet 3   KLOR-CON M20 20 MEQ tablet TAKE 1 TABLET BY MOUTH EVERY DAY 30 tablet 11   tamsulosin (FLOMAX) 0.4 MG CAPS capsule TAKE ONE CAPSULE BY MOUTH EVERY DAY AFTER SUPPER 90 capsule 1   No current facility-administered medications for this visit.    Allergies:   Patient has no known allergies.   Social History:  The patient  reports that he has never smoked. He has never used smokeless tobacco. He reports current alcohol use of about 1.0 standard drink of alcohol per week. He reports that he does not use drugs.   Family History:  The patient's  family history includes Bipolar disorder in his brother; Heart disease in his father; Hyperlipidemia in his mother; Hypertension in his mother; Ulcers in his father.    ROS:  Please see the history of present illness.   All other systems are reviewed and negative.    PHYSICAL EXAM: VS:  BP (!) 128/58   Pulse 67   Ht '5\' 6"'  (1.676 m)   Wt 210 lb 6.4 oz (95.4 kg)   SpO2 92%   BMI 33.96 kg/m  , BMI Body mass index is 33.96 kg/m. GEN: Well nourished, well developed, in no acute distress  HEENT: normal  Neck: no JVD, carotid bruits, or masses Cardiac: RRR; 2/6 crescendo decrescendo murmur at the RUSB Respiratory:  clear to auscultation bilaterally, normal work of breathing GI: soft, nontender, nondistended, + BS MS: no deformity or atrophy  Skin: warm and dry  Neuro:  Strength and sensation are intact Psych: euthymic mood, full affect  EKG:  EKG is not ordered today.   Recent Labs: 02/12/2021: TSH 2.51 05/20/2021: B Natriuretic Peptide 1,190.0; Hemoglobin 11.7; Platelets 82 07/06/2021: BUN 12; Creatinine, Ser 0.82; Potassium 3.9; Sodium 140 10/02/2021: ALT 16    Lipid Panel     Component Value Date/Time   CHOL 118 02/12/2021 1438   CHOL 111 07/23/2019 1144   TRIG 66.0 02/12/2021 1438   HDL 40.30 02/12/2021 1438   HDL 48 07/23/2019 1144   CHOLHDL 3 02/12/2021 1438   VLDL 13.2 02/12/2021  1438   LDLCALC 65 02/12/2021 1438   LDLCALC 53 11/30/2019 0929   LDLDIRECT 198.2 07/11/2009 0925     Wt Readings from Last 3 Encounters:  10/29/21 210 lb 6.4 oz (95.4 kg)  09/24/21 202 lb 12.8 oz (92 kg)  09/24/21 201 lb 6 oz (91.3 kg)      Other studies Reviewed: Additional studies/ records that were reviewed today include:  2D Echo: 1. Small region of apical hypokinesis.. Left ventricular ejection  fraction, by estimation, is 60 to 65%. The left ventricle has normal  function. The left ventricular internal cavity size was severely dilated.  There is mild left ventricular hypertrophy.  Left ventricular diastolic parameters are consistent with Grade II  diastolic dysfunction (pseudonormalization). Elevated left atrial  pressure.   2. Right ventricular systolic function is normal. The right ventricular  size  is mildly enlarged.   3. Left atrial size was moderately dilated.   4. Right atrial size was moderately dilated.   5. Mild mitral valve regurgitation.   6. AV is thickened, calcified with restricted motion. Peak and meang  gradients through the valve are 55 and 32 mm Hg AVA (VIT) is 1.40 cm2  Dimensionless index is 0.34 consistent with moderate AS. COmpared to echo  report from 2022, no sigificant change..   Aortic valve regurgitation is not visualized.   7. The inferior vena cava is normal in size with greater than 50%  respiratory variability, suggesting right atrial pressure of 3 mmHg.      ASSESSMENT AND PLAN:  1.  Paroxysmal atrial fibrillation I have seen MICKY SHELLER is a 71 y.o. male in the office today who has been referred for a Watchman left atrial appendage closure device.  He has a history of paroxysmal atrial fibrillation.  Atrial fibrillation-related risk scores are:   CHA2DS2-VASc Score = 4   This indicates a 4.8% annual risk of stroke. The patient's score is based upon: CHF History: 1 HTN History: 1 Diabetes History: 0 Stroke History:  0 Vascular Disease History: 1 Age Score: 1 Gender Score: 0        HAS-BLED score  Hypertension No  Abnormal renal and liver function (Dialysis, transplant, Cr >2.26 mg/dL /Cirrhosis or Bilirubin >2x Normal or AST/ALT/AP >3x Normal) Yes  Stroke No  Bleeding No  Labile INR (Unstable/high INR) No  Elderly (>65) Yes  Drugs or alcohol (? 8 drinks/week, anti-plt or NSAID) No   Unfortunately, He is not felt to be a long term anticoagulation candidate secondary to the presence of cirrhosis and thrombocytopenia.  The patients chart has been reviewed and I along with their referring cardiologist feel that they would be a candidate for short term oral anticoagulation.  Procedural risks for the Watchman implant have been reviewed with the patient including a 1% risk of stroke, 1% risk of perforation or pericardial effusion, 0.1% risk of device embolization.  Given the patient's poor candidacy for long-term oral anticoagulation, ability to tolerate short term oral anticoagulation, I have recommended the Watchman FLX left atrial appendage closure device through a shared decision making conversation with the patient.   Prior to the procedure, I would like to obtain a gated CT scan of the chest with contrast timed for PV/LA visualization.  If his left atrial appendage anatomy is suitable for watchman flx implantation, the patient would like to proceed at the next available time.  He will continue on apixaban in the periprocedural period.  After 6 weeks he will be changed to aspirin and clopidogrel through 6 months if criteria met.  Regarding his aortic stenosis, he appears to have moderate aortic stenosis with stable symptoms.  He will continue with close clinical and echo surveillance by Dr. Stanford Breed.  We briefly discussed TAVR as a potential treatment option in the future.  I will order his cardiac CTA so that he can be assessed for both TAVR and Watchman.  Current medicines are reviewed at length with the  patient today.   The patient does not have concerns regarding his medicines.  The following changes were made today:  none  Labs/ tests ordered today include:  Orders Placed This Encounter  Procedures   Basic metabolic panel     Signed, [name], MD 10/29/2021  5:16 PM     [place] [address] [phone] (office) (  [phone] (fax)

## 2021-10-30 ENCOUNTER — Other Ambulatory Visit: Payer: Self-pay | Admitting: Physician Assistant

## 2021-10-30 ENCOUNTER — Encounter: Payer: Self-pay | Admitting: Physician Assistant

## 2021-10-30 DIAGNOSIS — I8500 Esophageal varices without bleeding: Secondary | ICD-10-CM

## 2021-10-30 DIAGNOSIS — I480 Paroxysmal atrial fibrillation: Secondary | ICD-10-CM

## 2021-10-30 LAB — BASIC METABOLIC PANEL WITH GFR
BUN/Creatinine Ratio: 14 (ref 10–24)
BUN: 12 mg/dL (ref 8–27)
CO2: 24 mmol/L (ref 20–29)
Calcium: 9.4 mg/dL (ref 8.6–10.2)
Chloride: 105 mmol/L (ref 96–106)
Creatinine, Ser: 0.87 mg/dL (ref 0.76–1.27)
Glucose: 106 mg/dL — ABNORMAL HIGH (ref 70–99)
Potassium: 4.7 mmol/L (ref 3.5–5.2)
Sodium: 141 mmol/L (ref 134–144)
eGFR: 92 mL/min/1.73

## 2021-11-12 ENCOUNTER — Telehealth (HOSPITAL_COMMUNITY): Payer: Self-pay | Admitting: Emergency Medicine

## 2021-11-12 NOTE — Telephone Encounter (Signed)
Reaching out to patient to offer assistance regarding upcoming cardiac imaging study; pt verbalizes understanding of appt date/time, parking situation and where to check in, pre-test NPO status and medications ordered, and verified current allergies; name and call back number provided for further questions should they arise Marchia Bond RN [facility] [phone] office [phone] cell  Arrival 1230, w/c entrance Daily meds, except lasix Denies Iv issues

## 2021-11-13 ENCOUNTER — Ambulatory Visit (HOSPITAL_COMMUNITY)
Admission: RE | Admit: 2021-11-13 | Discharge: 2021-11-13 | Disposition: A | Discharge status: Home / Self Care | Payer: 59 | Source: Ambulatory Visit | Attending: Physician Assistant | Admitting: Physician Assistant

## 2021-11-13 DIAGNOSIS — I8500 Esophageal varices without bleeding: Secondary | ICD-10-CM | POA: Insufficient documentation

## 2021-11-13 DIAGNOSIS — I480 Paroxysmal atrial fibrillation: Secondary | ICD-10-CM | POA: Diagnosis not present

## 2021-11-13 MED ORDER — IOHEXOL 350 MG/ML SOLN
100.0000 mL | Freq: Once | INTRAVENOUS | Status: AC | PRN
  Administered 2021-11-13: 100 mL via INTRAVENOUS

## 2021-11-20 ENCOUNTER — Ambulatory Visit (INDEPENDENT_AMBULATORY_CARE_PROVIDER_SITE_OTHER): Payer: 59 | Admitting: Pulmonary Disease

## 2021-11-20 DIAGNOSIS — J849 Interstitial pulmonary disease, unspecified: Secondary | ICD-10-CM | POA: Diagnosis not present

## 2021-11-20 LAB — PULMONARY FUNCTION TEST
DL/VA % pred: 73 %
DL/VA: 3.03 ml/min/mmHg/L
DLCO cor % pred: 80 %
DLCO cor: 16.38 ml/min/mmHg
DLCO unc % pred: 80 %
DLCO unc: 16.38 ml/min/mmHg
FEF 25-75 Post: 4.15 L/sec
FEF 25-75 Pre: 3.17 L/sec
FEF2575-%Change-Post: 31 %
FEF2575-%Pred-Post: 236 %
FEF2575-%Pred-Pre: 180 %
FEV1-%Change-Post: 5 %
FEV1-%Pred-Post: 130 %
FEV1-%Pred-Pre: 123 %
FEV1-Post: 3.05 L
FEV1-Pre: 2.89 L
FEV1FVC-%Change-Post: 3 %
FEV1FVC-%Pred-Pre: 112 %
FEV6-%Change-Post: 3 %
FEV6-%Pred-Post: 119 %
FEV6-%Pred-Pre: 114 %
FEV6-Post: 3.58 L
FEV6-Pre: 3.45 L
FEV6FVC-%Change-Post: 1 %
FEV6FVC-%Pred-Post: 107 %
FEV6FVC-%Pred-Pre: 106 %
FVC-%Change-Post: 2 %
FVC-%Pred-Post: 110 %
FVC-%Pred-Pre: 108 %
FVC-Post: 3.58 L
FVC-Pre: 3.5 L
Post FEV1/FVC ratio: 85 %
Post FEV6/FVC ratio: 100 %
Pre FEV1/FVC ratio: 83 %
Pre FEV6/FVC Ratio: 99 %
RV % pred: 107 %
RV: 2.27 L
TLC % pred: 102 %
TLC: 5.76 L

## 2021-11-20 NOTE — Progress Notes (Signed)
PFT done today. 

## 2021-11-23 ENCOUNTER — Encounter: Payer: Self-pay | Admitting: Pulmonary Disease

## 2021-11-23 ENCOUNTER — Telehealth: Payer: Self-pay

## 2021-11-23 ENCOUNTER — Ambulatory Visit: Payer: 59 | Admitting: Pulmonary Disease

## 2021-11-23 ENCOUNTER — Other Ambulatory Visit: Payer: Self-pay

## 2021-11-23 VITALS — BP 140/64 | HR 69 | Temp 97.9°F | Ht 63.0 in | Wt 209.0 lb

## 2021-11-23 DIAGNOSIS — I480 Paroxysmal atrial fibrillation: Secondary | ICD-10-CM

## 2021-11-23 DIAGNOSIS — J849 Interstitial pulmonary disease, unspecified: Secondary | ICD-10-CM

## 2021-11-23 DIAGNOSIS — Z23 Encounter for immunization: Secondary | ICD-10-CM | POA: Diagnosis not present

## 2021-11-23 DIAGNOSIS — Z5181 Encounter for therapeutic drug level monitoring: Secondary | ICD-10-CM | POA: Diagnosis not present

## 2021-11-23 NOTE — Addendum Note (Signed)
Addended by: Elton Sin on: 11/23/2021 04:34 PM   Modules accepted: Orders

## 2021-11-23 NOTE — Progress Notes (Addendum)
[name]    [id number]    04/20/1950  Primary Care Physician:Hernandez Everardo Beals, MD  Referring Physician: Isaac Bliss, Rayford Halsted, MD [address] [place],  [place] [address]  Chief complaint: Follow up for interstitial lung disease  HPI: 71 y.o. who has past medical history of hypertension, coronary artery disease, paroxysmal atrial fibrillation on Eliquis anticoagulation, aortic stenosis, sleep apnea on CPAP, cirrhosis with varices secondary to [facility]  Referred for evaluation of dyspnea on exertion for several years.  He recently had a cardiac CT which showed interstitial changes Denies any cough, chest congestion.  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  Interim history: Here for review of CT scan PFTs and labs Dyspnea is stable He is being evaluated by cardiology for watchman's device.  Outpatient Encounter Medications as of 11/23/2021  Medication Sig   acetaminophen (TYLENOL) 500 MG tablet Take 1,000 mg by mouth daily as needed for moderate pain or headache.   atorvastatin (LIPITOR) 80 MG tablet TAKE 1 TABLET BY MOUTH EVERY DAY   carvedilol (COREG) 3.125 MG tablet TAKE 1 TABLET BY MOUTH TWICE A DAY WITH FOOD   CVS VITAMIN B12 1000 MCG tablet TAKE 1 TABLET BY MOUTH EVERY DAY   ELIQUIS 5 MG TABS tablet TAKE 1 TABLET BY MOUTH TWICE A DAY   empagliflozin (JARDIANCE) 10 MG TABS tablet Take 1 tablet (10 mg total) by mouth daily before breakfast.   furosemide (LASIX) 40 MG tablet Take 2 tablets (80 mg total) by mouth 2 (two) times daily.   KLOR-CON M20 20 MEQ tablet TAKE 1 TABLET BY MOUTH EVERY DAY   tamsulosin (FLOMAX) 0.4 MG CAPS capsule TAKE ONE CAPSULE BY  MOUTH EVERY DAY AFTER SUPPER   No facility-administered encounter medications on file as of 11/23/2021.   Physical Exam: Blood pressure (!) 140/64, pulse 69, temperature 97.9 F (36.6 C), temperature source Oral, height '5\' 3"'$  (1.6 m), weight 209 lb (94.8 kg), SpO2 90 %. Gen:      No acute distress HEENT:  EOMI, sclera anicteric Neck:     No masses; no thyromegaly Lungs:    Clear to auscultation bilaterally; normal respiratory effort CV:         Regular rate and rhythm; no murmurs Abd:      + bowel sounds; soft, non-tender; no palpable masses, no distension Ext:    No edema; adequate peripheral perfusion Skin:      Warm and dry; no rash Neuro: alert and oriented x 3 Psych: normal mood and affect   Data Reviewed: Imaging: Cardiac CT 08/15/2021 Cirrhosis with splenomegaly, aortic atherosclerosis, peripheral areas of groundglass attenuation, septal thickening with basilar predominance.   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 I have reviewed the images personally.  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  He does have a rheumatology evaluation pending  next year In the meantime we had a discussion about treatment options and decided to initiate him on pirfenidone We will need close monitoring of LFTs given history of cirrhosis.  Plan/Recommendations: Start pirfenidone Return to clinic in 2 to 3 months.  Marshell Garfinkel MD [facility] 11/23/2021, 3:47 PM  CC: Isaac Bliss, [place]*

## 2021-11-23 NOTE — Patient Instructions (Signed)
Based on a discussion today we will start treatment with a medication called pirfenidone to slow down the scarring.  Follow-up in 3 months.

## 2021-11-23 NOTE — Telephone Encounter (Signed)
-----   Message from [name], MD sent at 11/17/2021  1:20 PM EDT ----- Pt with suitable anatomy for watchman flx implant if he would like to proceed. Otherwise see recent office visit. Other findings from the scan reviewed. Question of SVG to circ/OM closure. Pt without anginal symptoms.

## 2021-11-23 NOTE — Telephone Encounter (Signed)
Reviewed results with patient who verbalized understanding.   The patient wishes to proceed with Watchman implant on 12/17/21. Pre-procedure visit scheduled 11/30/21. He was grateful for call and agrees with plan.

## 2021-11-24 ENCOUNTER — Telehealth: Payer: Self-pay | Admitting: Pharmacist

## 2021-11-24 DIAGNOSIS — J849 Interstitial pulmonary disease, unspecified: Secondary | ICD-10-CM

## 2021-11-24 NOTE — Telephone Encounter (Signed)
Received new start paperwork for Esbriet.  Please start Esbriet BIV (run for brand name first due to patient assistance requirement)  Dose using '267mg'$  tabs: Take 1 tab three times daily for 7 days, then 2 tabs three times daily for 7 days, then 3 tabs three times daily thereafter.  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

## 2021-11-24 NOTE — Telephone Encounter (Signed)
Submitted a Prior Authorization request to [facility] for PIRFENIDONE via CoverMyMeds. Will update once we receive a response.  Patient has Pharmacist, community and should be eligible for pirfenidone copay card once approved.  Key: [id number]  Knox Saliva, PharmD, MPH, BCPS, CPP Clinical Pharmacist (Rheumatology and Pulmonology)

## 2021-11-27 ENCOUNTER — Other Ambulatory Visit (HOSPITAL_COMMUNITY): Payer: Self-pay

## 2021-11-27 MED ORDER — PIRFENIDONE 267 MG PO TABS: ORAL_TABLET | ORAL | 0 refills | Status: DC

## 2021-11-27 MED ORDER — PIRFENIDONE 267 MG PO TABS: 801.0000 mg | ORAL_TABLET | Freq: Three times a day (TID) | ORAL | 4 refills | Status: DC

## 2021-11-27 NOTE — H&P (View-Only) (Signed)
[facility]                                     Cardiology Office Note:    Date:  11/30/2021   ID:  [name], DOB Jul 19, 1950, MRN [id number]  PCP:  Tony Raymond, NP  [facility] Cardiologist:  Tony Ruths, MD  [place] Electrophysiologist:  None   Referring MD: [name], Estel*   Chief Complaint  Patient presents with   Follow-up    Pre LAAO    History of Present Illness:    [name] is a 71 y.o. 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 Burt Foster for [place] evaluation.  [name] has a long hx of  cirrhosis complicated by thrombocytopenia and is followed closely by GI. Additionally he is followed by pulmonology due to interstitial lung disease. He has undergone high-resolution CT scanning and PFTs are pending. Dr.Cooper spoke briefly to him about his moderate AS and possible TAVR in the future which will be monitored with surveillance imaging for now.   Pre Watchman CT showed anatomy suitable for LAAO closure and is scheduled for 12/17/21 with Dr. Burt Foster.   Today he presents and reports that he has been doing well with stable SOB and LE edema and no new issues such as chest pain, palpitations, dizziness, bleeding, or syncope.   Past Medical History:  Diagnosis Date   Adjustment disorder with depressed mood 09/07/2007   Cataract    removed bilaterally   Chronic kidney disease    kidney stones   Cirrhosis ([place])    Colon polyps    Tubular Adenoma 2010   CONGESTIVE HEART FAILURE 12/09/2008   CORONARY ARTERY DISEASE 2006   HYPERLIPIDEMIA 08/09/2006   HYPERTENSION 08/09/2006   MYOCARDIAL INFARCTION, HX OF 07/07/2004   NEPHROLITHIASIS, HX OF 08/09/2006   OBESITY 07/19/2008   Sleep apnea    has a cpap - not using religiously    Past Surgical History:  Procedure  Laterality Date   BIOPSY  11/18/2020   Procedure: BIOPSY;  Surgeon: Tony Flock, MD;  Location: [facility];  Service: Gastroenterology;;   CARDIOVERSION N/A 12/14/2017   Procedure: CARDIOVERSION;  Surgeon: Tony Latch, MD;  Location: [facility];  Service: Cardiovascular;  Laterality: N/A;   CARDIOVERSION N/A 10/01/2019   Procedure: CARDIOVERSION;  Surgeon: Tony Dresser, MD;  Location: [facility];  Service: Cardiovascular;  Laterality: N/A;   COLONOSCOPY     COLONOSCOPY WITH PROPOFOL N/A 11/18/2020   Procedure: COLONOSCOPY WITH PROPOFOL;  Surgeon: Tony Flock, MD;  Location: [facility];  Service: Gastroenterology;  Laterality: N/A;   CORONARY ARTERY BYPASS GRAFT  2006   ESOPHAGOGASTRODUODENOSCOPY (EGD) WITH PROPOFOL N/A 11/18/2020   Procedure: ESOPHAGOGASTRODUODENOSCOPY (EGD) WITH PROPOFOL;  Surgeon: Tony Flock, MD;  [facility];  Service: Gastroenterology;  Laterality: N/A;   POLYPECTOMY     POLYPECTOMY  11/18/2020   Procedure: POLYPECTOMY;  Surgeon: Tony Flock, MD;  [facility];  Service: Gastroenterology;;   Current Medications: Current Meds  Medication Sig   acetaminophen (TYLENOL) 500 MG tablet Take 1,000 mg by mouth daily as needed for moderate pain or headache.   atorvastatin (LIPITOR) 80 MG tablet TAKE 1 TABLET BY MOUTH EVERY DAY  carvedilol (COREG) 3.125 MG tablet TAKE 1 TABLET BY MOUTH TWICE A DAY WITH FOOD   CVS VITAMIN B12 1000 MCG tablet TAKE 1 TABLET BY MOUTH EVERY DAY   ELIQUIS 5 MG TABS tablet TAKE 1 TABLET BY MOUTH TWICE A DAY   empagliflozin (JARDIANCE) 10 MG TABS tablet Take 1 tablet (10 mg total) by mouth daily before breakfast.   furosemide (LASIX) 40 MG tablet Take 2 tablets (80 mg total) by mouth 2 (two) times daily.   KLOR-CON M20 20 MEQ tablet TAKE 1 TABLET BY MOUTH EVERY DAY   [START ON 12/28/2021] Pirfenidone (ESBRIET) 267 MG TABS Take 3 tablets (801 mg total) by mouth 3 (three)  times daily with meals. Month 2 and onwards (maintenance)   tamsulosin (FLOMAX) 0.4 MG CAPS capsule TAKE ONE CAPSULE BY MOUTH EVERY DAY AFTER SUPPER    Allergies:   Patient has no known allergies.   Social History   Socioeconomic History   Marital status: Single    Spouse name: Not on file   Number of children: 0   Years of education: Not on file   Highest education level: Not on file  Occupational History   Occupation: Passenger transport manager  Tobacco Use   Smoking status: Never   Smokeless tobacco: Never  Vaping Use   Vaping Use: Never used  Substance and Sexual Activity   Alcohol use: Yes    Alcohol/week: 1.0 standard drink of alcohol    Types: 1 Cans of beer per week    Comment: occasional   Drug use: No   Sexual activity: Not on file  Other Topics Concern   Not on file  Social History Narrative   Not on file   Social Determinants of Health   Financial Resource Strain: Not on file  Food Insecurity: Not on file  Transportation Needs: Not on file  Physical Activity: Not on file  Stress: Not on file  Social Connections: Not on file     Family History: The patient's family history includes Bipolar disorder in his brother; Heart disease in his father; Hyperlipidemia in his mother; Hypertension in his mother; Ulcers in his father. There is no history of Colon cancer, Colon polyps, Esophageal cancer, Rectal cancer, or Stomach cancer.  ROS:   Please see the history of present illness.    All other systems reviewed and are negative.  EKGs/Labs/Other Studies Reviewed:    The following studies were reviewed today:  CT pulm/morph 11/13/21:  IMPRESSION: 1. The left atrial appendage is a small chicken wing.   2. A 27 watchman FLX device is recommended based on the above landing zone measurements (21.7 mm maximum diameter; 22% compression).   3. There is no thrombus in the left atrial appendage.   4. An mid-posterior IAS puncture site is recommended.    5. Optimal deployment angle: RAO CAU 10   6. Normal coronary origin. Right dominance. Prior CABG. Patent LIMA to LAD, SVG to diagonal. The SVG to acute marginal and SVG to PDA are not seen, and may be occluded.   7. Severe aortic valve calcification, AV calcium score 2784. Study was not performed to assess for TAVR candidacy.  EKG:  EKG is ordered today.  The ekg ordered today demonstrates NSR with HR 66bpm  Recent Labs: 02/12/2021: TSH 2.51 05/20/2021: B Natriuretic Peptide 1,190.0; Hemoglobin 11.7; Platelets 82 10/02/2021: ALT 16 10/29/2021: BUN 12; Creatinine, Ser 0.87; Potassium 4.7; Sodium 141   Recent Lipid Panel    Component Value Date/Time  CHOL 118 02/12/2021 1438   CHOL 111 07/23/2019 1144   TRIG 66.0 02/12/2021 1438   HDL 40.30 02/12/2021 1438   HDL 48 07/23/2019 1144   CHOLHDL 3 02/12/2021 1438   VLDL 13.2 02/12/2021 1438   LDLCALC 65 02/12/2021 1438   LDLCALC 53 11/30/2019 0929   LDLDIRECT 198.2 07/11/2009 0925   Risk Assessment/Calculations:    CHA2DS2-VASc Score = 4   This indicates a 4.8% annual risk of stroke. The patient's score is based upon: CHF History: 1 HTN History: 1 Diabetes History: 0 Stroke History: 0 Vascular Disease History: 1 Age Score: 1 Gender Score: 0   HAS-BLED score 2 Hypertension No  Abnormal renal and liver function (Dialysis, transplant, Cr >2.26 mg/dL /Cirrhosis or Bilirubin >2x Normal or AST/ALT/AP >3x Normal) Yes (bilirubin) Stroke No  Bleeding No  Labile INR (Unstable/high INR) No  Elderly (>65) Yes  Drugs or alcohol (? 8 drinks/week, anti-plt or NSAID) No   Physical Exam:    VS:  BP 130/60 (BP Location: Left Arm, Patient Position: Sitting, Cuff Size: Normal)   Pulse 66   Ht '5\' 3"'$  (1.6 m)   Wt 209 lb (94.8 kg)   BMI 37.02 kg/m     Wt Readings from Last 3 Encounters:  11/30/21 209 lb (94.8 kg)  11/23/21 209 lb (94.8 kg)  10/29/21 210 lb 6.4 oz (95.4 kg)    General: Well developed, well nourished,  NAD Lungs:Clear to ausculation bilaterally. No wheezes, rales, or rhonchi. Breathing is unlabored. Cardiovascular: RRR with S1 S2. + harsh systolic murmurs, rubs, gallops, or LV heave appreciated. Extremities: 2-3+ chronic LE edema.  Neuro: Alert and oriented. No focal deficits. No facial asymmetry. MAE spontaneously. Psych: Responds to questions appropriately with normal affect.    ASSESSMENT/PLAN:    Paroxysmal atrial fibrillation: Maintaining NSR today. Not a candidate for long term AC due to chronic cirrhosis and thrombocytopenia. Seen by Dr. Burt Foster for [place] consideration and felt to be a good candidate. Scheduled for 11/16. Instruction letter reviewed. Pre procedure soap given with instructions. Obtain CBC, BMET today.   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   Moderate aortic stenosis: Last echocardiogram 07/2021 with thickened and restricted AV. Peak gradient at 37mHg, mean at 374mg with AVA by VTI at 1.40 cm2  and DI at 0.34 consistent with moderate AS. Continue to monitor with surveillance imaging. Briefly discussed potential TAVR in the future when AS becomes symptomatic. Likely not a Progress Trial candidate given extensive lung a GI issues. He is stable with NYHA class I symptoms   Medication Adjustments/Labs and Tests Ordered: Current medicines are reviewed at length with the patient today.  Concerns regarding medicines are outlined above.  Orders Placed This Encounter  Procedures   CBC   Basic metabolic panel   EKG 1270-YOVZ No orders of the defined types were placed in this encounter.   Patient Instructions  Medication Instructions:  The current medical regimen is effective;  continue present plan and medications.  *If you need a refill on your cardiac medications before your next appointment, please call your pharmacy*   Lab Work: Please have blood work  today.  (BMP, CBC)  If you have labs (blood work) drawn today and your tests are completely normal, you will receive your results only by: [place]if you have MyChart) OR A paper copy in the mail If you have any lab test  that is abnormal or we need to change your treatment, we will call you to review the results.   Follow-Up: At [facility], you and your health needs are our priority.  As part of our continuing mission to provide you with exceptional heart care, we have created designated Provider Care Teams.  These Care Teams include your primary Cardiologist (physician) and Advanced Practice Providers (APPs -  Physician Assistants and Nurse Practitioners) who all work together to provide you with the care you need, when you need it.  We recommend signing up for the patient portal called "MyChart".  Sign up information is provided on this After Visit Summary.  MyChart is used to connect with patients for Virtual Visits (Telemedicine).  Patients are able to view lab/test results, encounter notes, upcoming appointments, etc.  Non-urgent messages can be sent to your provider as well.   To learn more about what you can do with MyChart, go to [email or web address].    Your next appointment:   As scheduled.   Important Information About Sugar         Signed, Kathyrn Drown, NP  11/30/2021 6:33 PM    [facility]

## 2021-11-27 NOTE — Telephone Encounter (Signed)
Received notification from [facility] regarding a prior authorization for PIRFENIDONE. Authorization has been APPROVED from 11/24/21 to 11/25/22.   Unable to run test claim because patient must fill through [place]: [phone]   Authorization # [id number]  Rx for pirfenidone titration dosing for firth month and maintenance dose rx sent to [facility] today. Will f/u with pharmacy to ensure they enroll pt into copay card  Knox Saliva, PharmD, MPH, BCPS, CPP Clinical Pharmacist (Rheumatology and Pulmonology)

## 2021-11-27 NOTE — Progress Notes (Unsigned)
[facility]                                     Cardiology Office Note:    Date:  11/30/2021   ID:  [name], DOB Jun 27, 1950, MRN [id number]  PCP:  Tony Raymond, NP  [facility] Cardiologist:  Tony Ruths, MD  [place] Electrophysiologist:  None   Referring MD: [name], Estel*   Chief Complaint  Patient presents with   Follow-up    Pre LAAO    History of Present Illness:    [name] is a 71 y.o. 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  [name] has a long hx of  cirrhosis complicated by thrombocytopenia and is followed closely by GI. Additionally he is followed by pulmonology due to interstitial lung disease. He has undergone high-resolution CT scanning and PFTs are pending. TonyCooper spoke briefly to him about his moderate AS and possible TAVR in the future which will be monitored with surveillance imaging for now.   Pre [facility] CT showed anatomy suitable for LAAO closure and is scheduled for 12/17/21 with [name].   Today he presents and reports that he has been doing well with stable SOB and LE edema and no new issues such as chest pain, palpitations, dizziness, bleeding, or syncope.   Past Medical History:  Diagnosis Date   Adjustment disorder with depressed mood 09/07/2007   Cataract    removed bilaterally   Chronic kidney disease    kidney stones   Cirrhosis ([place])    Colon polyps    Tubular Adenoma 2010   CONGESTIVE HEART FAILURE 12/09/2008   CORONARY ARTERY DISEASE 2006   HYPERLIPIDEMIA 08/09/2006   HYPERTENSION 08/09/2006   MYOCARDIAL INFARCTION, HX OF 07/07/2004   NEPHROLITHIASIS, HX OF 08/09/2006   OBESITY 07/19/2008   Sleep apnea    has a cpap - not using religiously    Past Surgical History:  Procedure  Laterality Date   BIOPSY  11/18/2020   Procedure: BIOPSY;  Surgeon: Yetta Flock, MD;  Location: [facility];  Service: Gastroenterology;;   CARDIOVERSION N/A 12/14/2017   Procedure: CARDIOVERSION;  Surgeon: [name], MD;  Location: [facility];  Service: Cardiovascular;  Laterality: N/A;   CARDIOVERSION N/A 10/01/2019   Procedure: CARDIOVERSION;  Surgeon: Buford Dresser, MD;  Location: [place];  Service: Cardiovascular;  Laterality: N/A;   COLONOSCOPY     COLONOSCOPY WITH PROPOFOL N/A 11/18/2020   Procedure: COLONOSCOPY WITH PROPOFOL;  Surgeon: Yetta Flock, MD;  [facility];  Service: Gastroenterology;  Laterality: N/A;   CORONARY ARTERY BYPASS GRAFT  2006   ESOPHAGOGASTRODUODENOSCOPY (EGD) WITH PROPOFOL N/A 11/18/2020   Procedure: ESOPHAGOGASTRODUODENOSCOPY (EGD) WITH PROPOFOL;  Surgeon: Yetta Flock, MD;  [facility];  Service: Gastroenterology;  Laterality: N/A;   POLYPECTOMY     POLYPECTOMY  11/18/2020   Procedure: POLYPECTOMY;  Surgeon: Yetta Flock, MD;  [facility];  Service: Gastroenterology;;   Current Medications: Current Meds  Medication Sig   acetaminophen (TYLENOL) 500 MG tablet Take 1,000 mg by mouth daily as needed for moderate pain or headache.   atorvastatin (LIPITOR) 80 MG tablet TAKE 1 TABLET BY MOUTH EVERY DAY  carvedilol (COREG) 3.125 MG tablet TAKE 1 TABLET BY MOUTH TWICE A DAY WITH FOOD   CVS VITAMIN B12 1000 MCG tablet TAKE 1 TABLET BY MOUTH EVERY DAY   ELIQUIS 5 MG TABS tablet TAKE 1 TABLET BY MOUTH TWICE A DAY   empagliflozin (JARDIANCE) 10 MG TABS tablet Take 1 tablet (10 mg total) by mouth daily before breakfast.   furosemide (LASIX) 40 MG tablet Take 2 tablets (80 mg total) by mouth 2 (two) times daily.   KLOR-CON M20 20 MEQ tablet TAKE 1 TABLET BY MOUTH EVERY DAY   [START ON 12/28/2021] Pirfenidone (ESBRIET) 267 MG TABS Take 3 tablets (801 mg total) by mouth 3 (three)  times daily with meals. Month 2 and onwards (maintenance)   tamsulosin (FLOMAX) 0.4 MG CAPS capsule TAKE ONE CAPSULE BY MOUTH EVERY DAY AFTER SUPPER    Allergies:   Patient has no known allergies.   Social History   Socioeconomic History   Marital status: Single    Spouse name: Not on file   Number of children: 0   Years of education: Not on file   Highest education level: Not on file  Occupational History   Occupation: Passenger transport manager  Tobacco Use   Smoking status: Never   Smokeless tobacco: Never  Vaping Use   Vaping Use: Never used  Substance and Sexual Activity   Alcohol use: Yes    Alcohol/week: 1.0 standard drink of alcohol    Types: 1 Cans of beer per week    Comment: occasional   Drug use: No   Sexual activity: Not on file  Other Topics Concern   Not on file  Social History Narrative   Not on file   Social Determinants of Health   Financial Resource Strain: Not on file  Food Insecurity: Not on file  Transportation Needs: Not on file  Physical Activity: Not on file  Stress: Not on file  Social Connections: Not on file     Family History: The patient's family history includes Bipolar disorder in his brother; Heart disease in his father; Hyperlipidemia in his mother; Hypertension in his mother; Ulcers in his father. There is no history of Colon cancer, Colon polyps, Esophageal cancer, Rectal cancer, or Stomach cancer.  ROS:   Please see the history of present illness.    All other systems reviewed and are negative.  EKGs/Labs/Other Studies Reviewed:    The following studies were reviewed today:  CT pulm/morph 11/13/21:  IMPRESSION: 1. The left atrial appendage is a small chicken wing.   2. A 27 watchman FLX device is recommended based on the above landing zone measurements (21.7 mm maximum diameter; 22% compression).   3. There is no thrombus in the left atrial appendage.   4. An mid-posterior IAS puncture site is recommended.    5. Optimal deployment angle: RAO CAU 10   6. Normal coronary origin. Right dominance. Prior CABG. Patent LIMA to LAD, SVG to diagonal. The SVG to acute marginal and SVG to PDA are not seen, and may be occluded.   7. Severe aortic valve calcification, AV calcium score 2784. Study was not performed to assess for TAVR candidacy.  EKG:  EKG is ordered today.  The ekg ordered today demonstrates NSR with HR 66bpm  Recent Labs: 02/12/2021: TSH 2.51 05/20/2021: B Natriuretic Peptide 1,190.0; Hemoglobin 11.7; Platelets 82 10/02/2021: ALT 16 10/29/2021: BUN 12; Creatinine, Ser 0.87; Potassium 4.7; Sodium 141   Recent Lipid Panel    Component Value Date/Time  CHOL 118 02/12/2021 1438   CHOL 111 07/23/2019 1144   TRIG 66.0 02/12/2021 1438   HDL 40.30 02/12/2021 1438   HDL 48 07/23/2019 1144   CHOLHDL 3 02/12/2021 1438   VLDL 13.2 02/12/2021 1438   LDLCALC 65 02/12/2021 1438   LDLCALC 53 11/30/2019 0929   LDLDIRECT 198.2 07/11/2009 0925   Risk Assessment/Calculations:    CHA2DS2-VASc Score = 4   This indicates a 4.8% annual risk of stroke. The patient's score is based upon: CHF History: 1 HTN History: 1 Diabetes History: 0 Stroke History: 0 Vascular Disease History: 1 Age Score: 1 Gender Score: 0   HAS-BLED score 2 Hypertension No  Abnormal renal and liver function (Dialysis, transplant, Cr >2.26 mg/dL /Cirrhosis or Bilirubin >2x Normal or AST/ALT/AP >3x Normal) Yes (bilirubin) Stroke No  Bleeding No  Labile INR (Unstable/high INR) No  Elderly (>65) Yes  Drugs or alcohol (? 8 drinks/week, anti-plt or NSAID) No   Physical Exam:    VS:  BP 130/60 (BP Location: Left Arm, Patient Position: Sitting, Cuff Size: Normal)   Pulse 66   Ht '5\' 3"'$  (1.6 m)   Wt 209 lb (94.8 kg)   BMI 37.02 kg/m     Wt Readings from Last 3 Encounters:  11/30/21 209 lb (94.8 kg)  11/23/21 209 lb (94.8 kg)  10/29/21 210 lb 6.4 oz (95.4 kg)    General: Well developed, well nourished,  NAD Lungs:Clear to ausculation bilaterally. No wheezes, rales, or rhonchi. Breathing is unlabored. Cardiovascular: RRR with S1 S2. + harsh systolic murmurs, rubs, gallops, or LV heave appreciated. Extremities: 2-3+ chronic LE edema.  Neuro: Alert and oriented. No focal deficits. No facial asymmetry. MAE spontaneously. Psych: Responds to questions appropriately with normal affect.    ASSESSMENT/PLAN:    Paroxysmal atrial fibrillation: Maintaining NSR today. Not a candidate for long term AC due to chronic cirrhosis and thrombocytopenia. Seen by [name] for [place] consideration and felt to be a good candidate. Scheduled for 11/16. Instruction letter reviewed. Pre procedure soap given with instructions. Obtain CBC, BMET today.   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   Moderate aortic stenosis: Last echocardiogram 07/2021 with thickened and restricted AV. Peak gradient at 21mHg, mean at 368mg with AVA by VTI at 1.40 cm2  and DI at 0.34 consistent with moderate AS. Continue to monitor with surveillance imaging. Briefly discussed potential TAVR in the future when AS becomes symptomatic. Likely not a Progress Trial candidate given extensive lung a GI issues. He is stable with NYHA class I symptoms   Medication Adjustments/Labs and Tests Ordered: Current medicines are reviewed at length with the patient today.  Concerns regarding medicines are outlined above.  Orders Placed This Encounter  Procedures   CBC   Basic metabolic panel   EKG 1211-[id number] No orders of the defined types were placed in this encounter.   Patient Instructions  Medication Instructions:  The current medical regimen is effective;  continue present plan and medications.  *If you need a refill on your cardiac medications before your next appointment, please call your pharmacy*   Lab Work: Please have blood work  today.  (BMP, CBC)  If you have labs (blood work) drawn today and your tests are completely normal, you will receive your results only by: [place]if you have MyChart) OR A paper copy in the mail If you have any lab test  that is abnormal or we need to change your treatment, we will call you to review the results.   Follow-Up: At [facility], you and your health needs are our priority.  As part of our continuing mission to provide you with exceptional heart care, we have created designated Provider Care Teams.  These Care Teams include your primary Cardiologist (physician) and Advanced Practice Providers (APPs -  Physician Assistants and Nurse Practitioners) who all work together to provide you with the care you need, when you need it.  We recommend signing up for the patient portal called "MyChart".  Sign up information is provided on this After Visit Summary.  MyChart is used to connect with patients for Virtual Visits (Telemedicine).  Patients are able to view lab/test results, encounter notes, upcoming appointments, etc.  Non-urgent messages can be sent to your provider as well.   To learn more about what you can do with MyChart, go to [email or web address].    Your next appointment:   As scheduled.   Important Information About Sugar         Signed, Kathyrn Drown, NP  11/30/2021 6:33 PM     [facility]

## 2021-11-30 ENCOUNTER — Ambulatory Visit: Payer: 59 | Attending: Cardiology | Admitting: Cardiology

## 2021-11-30 VITALS — BP 130/60 | HR 66 | Ht 63.0 in | Wt 209.0 lb

## 2021-11-30 DIAGNOSIS — I5042 Chronic combined systolic (congestive) and diastolic (congestive) heart failure: Secondary | ICD-10-CM | POA: Diagnosis not present

## 2021-11-30 DIAGNOSIS — I480 Paroxysmal atrial fibrillation: Secondary | ICD-10-CM | POA: Diagnosis not present

## 2021-11-30 DIAGNOSIS — I350 Nonrheumatic aortic (valve) stenosis: Secondary | ICD-10-CM

## 2021-11-30 DIAGNOSIS — Z01812 Encounter for preprocedural laboratory examination: Secondary | ICD-10-CM | POA: Diagnosis not present

## 2021-11-30 DIAGNOSIS — J849 Interstitial pulmonary disease, unspecified: Secondary | ICD-10-CM

## 2021-11-30 NOTE — Patient Instructions (Signed)
Medication Instructions:  The current medical regimen is effective;  continue present plan and medications.  *If you need a refill on your cardiac medications before your next appointment, please call your pharmacy*   Lab Work: Please have blood work today.  (BMP, CBC)  If you have labs (blood work) drawn today and your tests are completely normal, you will receive your results only by: [place] (if you have MyChart) OR A paper copy in the mail If you have any lab test that is abnormal or we need to change your treatment, we will call you to review the results.   Follow-Up: At [facility], you and your health needs are our priority.  As part of our continuing mission to provide you with exceptional heart care, we have created designated Provider Care Teams.  These Care Teams include your primary Cardiologist (physician) and Advanced Practice Providers (APPs -  Physician Assistants and Nurse Practitioners) who all work together to provide you with the care you need, when you need it.  We recommend signing up for the patient portal called "MyChart".  Sign up information is provided on this After Visit Summary.  MyChart is used to connect with patients for Virtual Visits (Telemedicine).  Patients are able to view lab/test results, encounter notes, upcoming appointments, etc.  Non-urgent messages can be sent to your provider as well.   To learn more about what you can do with MyChart, go to [email or web address].    Your next appointment:   As scheduled.   Important Information About Sugar

## 2021-12-01 LAB — BASIC METABOLIC PANEL
BUN/Creatinine Ratio: 17 (ref 10–24)
BUN: 14 mg/dL (ref 8–27)
CO2: 21 mmol/L (ref 20–29)
Calcium: 9 mg/dL (ref 8.6–10.2)
Chloride: 106 mmol/L (ref 96–106)
Creatinine, Ser: 0.81 mg/dL (ref 0.76–1.27)
Glucose: 111 mg/dL — ABNORMAL HIGH (ref 70–99)
Potassium: 4.3 mmol/L (ref 3.5–5.2)
Sodium: 139 mmol/L (ref 134–144)
eGFR: 94 mL/min/{1.73_m2} (ref >59)

## 2021-12-01 LAB — CBC
Hematocrit: 38.4 % (ref 37.5–51.0)
Hemoglobin: 13 g/dL (ref 13.0–17.7)
MCH: 31.9 pg (ref 26.6–33.0)
MCHC: 33.9 g/dL (ref 31.5–35.7)
MCV: 94 fL (ref 79–97)
Platelets: 88 10*3/uL — CL (ref 150–450)
RBC: 4.07 x10E6/uL — ABNORMAL LOW (ref 4.14–5.80)
RDW: 14.6 % (ref 11.6–15.4)
WBC: 4.4 10*3/uL (ref 3.4–10.8)

## 2021-12-02 ENCOUNTER — Telehealth: Payer: Self-pay

## 2021-12-02 NOTE — Telephone Encounter (Signed)
Pt called stating that he was returning a call from [place] about the [place]. Pt stated that when he was first called, he was driving and was told to call back when he got home. Pt is now at home and is now available for Allyson to call him back.  Routing this to [employer].

## 2021-12-02 NOTE — Telephone Encounter (Signed)
ATC the patient. He had just pulled into his driveway and would like to call back. We did briefly discuss side effects and disease longevity.   Maryan Puls, PharmD PGY-1 [facility] Pharmacy Resident

## 2021-12-04 ENCOUNTER — Telehealth: Payer: Self-pay | Admitting: Pharmacist

## 2021-12-04 NOTE — Telephone Encounter (Signed)
Subjective:  Patient called today by [facility] Pulmonary pharmacy team for pirfenidone new start for IPF.   Patient was last seen by Dr. Vaughan Browner on 11/23/21.  Pertinent past medical history includes MI, CAD, CHF, history of cirrhosis,, dyslipidemia.  History of elevated LFTs: not recently. History of diarrhea, nausea, vomiting: No  He works for [employer] and is planning to retire in the next few months. Will be swtiching all of his coverage to Medicare.  Objective: No Known Allergies  Outpatient Encounter Medications as of 12/04/2021  Medication Sig   acetaminophen (TYLENOL) 500 MG tablet Take 1,000 mg by mouth daily as needed for moderate pain or headache.   atorvastatin (LIPITOR) 80 MG tablet TAKE 1 TABLET BY MOUTH EVERY DAY   carvedilol (COREG) 3.125 MG tablet TAKE 1 TABLET BY MOUTH TWICE A DAY WITH FOOD   CVS VITAMIN B12 1000 MCG tablet TAKE 1 TABLET BY MOUTH EVERY DAY   ELIQUIS 5 MG TABS tablet TAKE 1 TABLET BY MOUTH TWICE A DAY   empagliflozin (JARDIANCE) 10 MG TABS tablet Take 1 tablet (10 mg total) by mouth daily before breakfast.   furosemide (LASIX) 40 MG tablet Take 2 tablets (80 mg total) by mouth 2 (two) times daily.   KLOR-CON M20 20 MEQ tablet TAKE 1 TABLET BY MOUTH EVERY DAY   [START ON 12/28/2021] Pirfenidone (ESBRIET) 267 MG TABS Take 3 tablets (801 mg total) by mouth 3 (three) times daily with meals. Month 2 and onwards (maintenance)   tamsulosin (FLOMAX) 0.4 MG CAPS capsule TAKE ONE CAPSULE BY MOUTH EVERY DAY AFTER SUPPER   No facility-administered encounter medications on file as of 12/04/2021.     Immunization History  Administered Date(s) Administered   Fluad Quad(high Dose 65+) 11/29/2018, 11/30/2019, 11/06/2020, 11/23/2021   Hep A / Hep B 10/10/2020   Hepatitis A, Adult 11/10/2020   Hepb-cpg 11/10/2020, 12/11/2020, 05/11/2021   Influenza Split 10/26/2011, 11/01/2017   Influenza Whole 12/03/2008   PFIZER(Purple Top)SARS-COV-2 Vaccination 04/16/2019,  05/08/2019   Pneumococcal Conjugate-13 07/04/2017   Pneumococcal Polysaccharide-23 11/29/2018   Tdap 09/29/2010, 02/12/2021      PFT's TLC  Date Value Ref Range Status  11/20/2021 5.76 L Final      CMP     Component Value Date/Time   NA 139 11/30/2021 1316   K 4.3 11/30/2021 1316   CL 106 11/30/2021 1316   CO2 21 11/30/2021 1316   GLUCOSE 111 (H) 11/30/2021 1316   GLUCOSE 145 (H) 05/20/2021 1234   BUN 14 11/30/2021 1316   CREATININE 0.81 11/30/2021 1316   CREATININE 0.82 10/16/2020 1437   CREATININE 0.81 11/30/2019 0929   CALCIUM 9.0 11/30/2021 1316   PROT 7.3 10/02/2021 1449   PROT 6.7 07/23/2019 1144   ALBUMIN 3.1 (L) 10/02/2021 1449   ALBUMIN 3.5 (L) 07/23/2019 1144   AST 28 10/02/2021 1449   AST 34 10/16/2020 1437   ALT 16 10/02/2021 1449   ALT 20 10/16/2020 1437   ALKPHOS 69 10/02/2021 1449   BILITOT 2.7 (H) 10/02/2021 1449   BILITOT 2.1 (H) 10/16/2020 1437   GFRNONAA >60 05/20/2021 1234   GFRNONAA >60 10/16/2020 1437   GFRAA 101 11/22/2019 1228      CBC    Component Value Date/Time   WBC 4.4 11/30/2021 1316   WBC 5.5 05/20/2021 1234   RBC 4.07 (L) 11/30/2021 1316   RBC 3.79 (L) 05/20/2021 1234   HGB 13.0 11/30/2021 1316   HCT 38.4 11/30/2021 1316   PLT 88 (LL)  11/30/2021 1316   MCV 94 11/30/2021 1316   MCH 31.9 11/30/2021 1316   MCH 30.9 05/20/2021 1234   MCHC 33.9 11/30/2021 1316   MCHC 32.8 05/20/2021 1234   RDW 14.6 11/30/2021 1316   LYMPHSABS 1.3 05/20/2021 1234   MONOABS 0.4 05/20/2021 1234   EOSABS 0.2 05/20/2021 1234   BASOSABS 0.0 05/20/2021 1234      LFT's    Latest Ref Rng & Units 10/02/2021    2:49 PM 05/20/2021   12:34 PM 02/12/2021    2:38 PM  Hepatic Function  Total Protein 6.0 - 8.3 g/dL 7.3  7.0  7.3   Albumin 3.5 - 5.2 g/dL 3.1  2.5  3.4   AST 0 - 37 U/L 28  44  29   ALT 0 - 53 U/L _0 Alk Phosphatase 39 - 117 U/L 69  58  80   Total Bilirubin 0.2 - 1.2 mg/dL 2.7  2.6  2.5   Bilirubin, Direct 0.0 - 0.3 mg/dL  0.6       HRCT (10/27/2021) - 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 Findings are categorized as probable UIP per consensus guidelines  Assessment and Plan  Esbriet Medication Management Thoroughly counseled patient on the efficacy, mechanism of action, dosing, administration, adverse effects, and monitoring parameters of Esbriet.  Patient verbalized understanding.   Goals of Therapy: Will not stop or reverse the progression of ILD. It will slow the progression of ILD.   Dosing: Starting dose will be Esbriet 267 mg 1 tablet three times daily for 7 days, then 2 tablets three times daily for 7 days, then 3 tablets three times daily.  Maintenance dose will be 801 mg 1 tablet three times daily if tolerated.  Stressed the importance of taking with meals and space at least 5-6 hours apart to minimize stomach upset.   Adverse Effects: Nausea, vomiting, diarrhea, weight loss Abdominal pain GERD - only triggered by certain foods (I.e. spicy foods) Sun sensitivity/rash - patient advised to wear sunscreen when exposed to sunlight Dizziness Fatigue  Monitoring: Monitor for diarrhea, nausea and vomiting, GI perforation, hepatotoxicity  Monitor LFTs - baseline, monthly for first 6 months, then every 3 months routinely CBC w differential at baseline and every 3 months routinely Will complete labs in [place], [place] or [place] since [place] clinic is one hour away  Access: Approval of Esbriet through: insurance Rx sent to: [place]: [id number]  He is switching to Medicare for prescription coverage in 2024 since he is retiring. Advised him to contact our clinic when this occurs so we can start process for patient assistance  Medication Reconciliation A drug regimen assessment was performed, including review of allergies, interactions, disease-state management, dosing and immunization history. Medications were reviewed with the patient,  including name, instructions, indication, goals of therapy, potential side effects, importance of adherence, and safe use.  This appointment required 60 minutes of patient care (this includes precharting, chart review, review of results, face-to-face care, etc.).  Thank you for involving pharmacy to assist in providing this patient's care.   Knox Saliva, PharmD, MPH, BCPS, CPP Clinical Pharmacist (Rheumatology and Pulmonology)

## 2021-12-04 NOTE — Telephone Encounter (Signed)
Called [facility] to determine what manufacturer they are using for his pirfenidone. Per rep, it is [employer]. Provided rep with pirfenidone copay card information which covers $500 per monthly prescription fill:  BIN: [id number] PCN: [id number] GROUP: [id number] ID: [id number]  Rep processed but then copay card is being processed as primary and leaving patient with >$450 copay. For some reason, the copay card is bumping his insurance to secondary automatically once they enter some PA code. After one hour back and forth with [place] and after the rep spoke extensively with copay card company to explain situation - no resolution reached. They provided rep with email address but of course, the pharmacy rep did not ask what the email would be used for.  Email: [email or web address] Phone: [phone]  I then inquired if another manufacturer could be dispensed for this patient. Spoke with pharmacist. They are only contracted with [employer] manufacturer.  I then called copay card company myself to determine what this email is for. They said that they need verification that of claim through insurance and that patient does not have Medicare. Called pharmacy back with copay card on phone and then copay card rep hung up on line.   I called patient and advised of this 3 hour long adventure. Since I requested Mariel Kansky refer patient for cost assistance internally, I ask him to call pharmacy on Wednesday for update. Pharmacy team to follow-up  Knox Saliva, PharmD, MPH, BCPS, CPP Clinical Pharmacist (Rheumatology and Pulmonology)

## 2021-12-10 ENCOUNTER — Telehealth: Payer: Self-pay

## 2021-12-10 NOTE — Telephone Encounter (Signed)
Called to instruct patient to [place] after Sunday,11/12 dose. He was grateful for call and agreed with plan.  Updated instructions sent to patient via MyChart.

## 2021-12-15 NOTE — Progress Notes (Signed)
HPI: FU CAD, AS and atrial flutter/fibrillation; history of coronary artery disease, status post coronary artery bypass graft surgery performed in June 2006. Patient had a LIMA to the LAD, saphenous vein graft to the diagonal, saphenous vein graft to the acute marginal and saphenous vein graft to the PDA. Carotid Dopplers August 2015 showed no significant stenosis. Nuclear study August 2015 showed ejection fraction 55% but no ischemia or infarction. Possible mild decrease in systolic blood pressure at peak exercise.  Patient also with h/o atrial fibrillation/atrial flutter. Monitor December 2021 showed sinus rhythm with rare PAC and PVC.  Abdominal ultrasound June 2022 showed no aneurysm.  EGD October 2022 showed 1 moderate varix. Abdominal ultrasound December 2022 showed changes of cirrhosis.  Echocardiogram repeated June 2023 and showed normal LV function, severe left ventricular enlargement, mild left ventricular hypertrophy, grade 2 diastolic dysfunction, mild right ventricular enlargement, moderate biatrial enlargement, mild mitral regurgitation, moderate aortic stenosis with mean gradient 32 mmHg, aortic valve area 1.4 cm and dimensionless index 0.34.  Calcium score July 2023 showed cirrhosis with splenomegaly and findings concerning for portal venous hypertension, possible interstitial lung disease, calcium score of aortic valve 2791.  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  CT for morphology prior to left atrial appendage occlusion October 2023 showed left lower lobe nodule and follow-up recommended 3 months.  Since last seen patient notes increased dyspnea on exertion.  No orthopnea, PND, chest pain or syncope.  He has chronic edema that worsens when he does not take his Lasix.  Current Outpatient Medications  Medication Sig Dispense Refill   acetaminophen  (TYLENOL) 500 MG tablet Take 1,000 mg by mouth daily as needed for moderate pain or headache.     atorvastatin (LIPITOR) 80 MG tablet TAKE 1 TABLET BY MOUTH EVERY DAY 90 tablet 1   carvedilol (COREG) 3.125 MG tablet TAKE 1 TABLET BY MOUTH TWICE A DAY WITH FOOD 60 tablet 5   CVS VITAMIN B12 1000 MCG tablet TAKE 1 TABLET BY MOUTH EVERY DAY 180 tablet 1   ELIQUIS 5 MG TABS tablet TAKE 1 TABLET BY MOUTH TWICE A DAY 60 tablet 5   empagliflozin (JARDIANCE) 10 MG TABS tablet Take 1 tablet (10 mg total) by mouth daily before breakfast. 30 tablet 11   furosemide (LASIX) 40 MG tablet Take 2 tablets (80 mg total) by mouth 2 (two) times daily. 360 tablet 3   KLOR-CON M20 20 MEQ tablet TAKE 1 TABLET BY MOUTH EVERY DAY 30 tablet 11   Misc Natural Products (LIVER PROTECT PO) Take 1 capsule by mouth daily. From [employer]     Pirfenidone (ESBRIET) 267 MG TABS Take 3 tablets (801 mg total) by mouth 3 (three) times daily with meals. Month 2 and onwards (maintenance) 270 tablet 4   tamsulosin (FLOMAX) 0.4 MG CAPS capsule TAKE ONE CAPSULE BY MOUTH EVERY DAY AFTER SUPPER 90 capsule 1   No current facility-administered medications for this visit.     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 CBurt Knack  Sleep apnea    has a cpap - not using religiously    Past Surgical History:  Procedure Laterality Date   BIOPSY  11/18/2020   Procedure: BIOPSY;  Surgeon: AYetta Flock MD;  [facility];  Service: Gastroenterology;;   CARDIOVERSION N/A 12/14/2017   Procedure: CARDIOVERSION;  Surgeon: RSkeet Latch MD;  [facility]  Service: Cardiovascular;  Laterality: N/A;   CARDIOVERSION N/A 10/01/2019   Procedure: CARDIOVERSION;  Surgeon: CBuford Dresser MD;  [facility];  Service: Cardiovascular;  Laterality: N/A;   COLONOSCOPY     COLONOSCOPY WITH PROPOFOL N/A 11/18/2020   Procedure: COLONOSCOPY WITH PROPOFOL;  Surgeon: AYetta Flock MD;  [facility];  Service: Gastroenterology;  Laterality: N/A;   CORONARY ARTERY BYPASS GRAFT  2006   ESOPHAGOGASTRODUODENOSCOPY (EGD) WITH PROPOFOL N/A 11/18/2020   Procedure: ESOPHAGOGASTRODUODENOSCOPY (EGD) WITH PROPOFOL;  Surgeon: AYetta Flock MD;  [facility]: [facility];  Service: Gastroenterology;  Laterality: N/A;   LEFT ATRIAL APPENDAGE OCCLUSION N/A 12/17/2021   Procedure: LEFT ATRIAL APPENDAGE OCCLUSION;  Surgeon: CSherren Mocha MD;  [facility];  Service: Cardiovascular;  Laterality: N/A;   POLYPECTOMY     POLYPECTOMY  11/18/2020   Procedure: POLYPECTOMY;  Surgeon: AYetta Flock MD;  [facility]: [facility];  Service: Gastroenterology;;   TEE WITHOUT CARDIOVERSION N/A 12/17/2021   Procedure: TRANSESOPHAGEAL ECHOCARDIOGRAM (TEE);  Surgeon: CSherren Mocha MD;  [facility];  Service: Cardiovascular;  Laterality: N/A;    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 Tobacco Use   Smoking status: Never   Smokeless tobacco: Never  Vaping Use   Vaping Use: Never used  Substance and Sexual Activity   Alcohol use: Yes    Alcohol/week: 1.0 standard drink of alcohol    Types: 1 Cans of beer per week    Comment:  occasional   Drug use: No   Sexual activity: Not on file  Other Topics Concern   Not on file  Social History Narrative   Not on file   Social Determinants of Health   Financial Resource Strain: Not on file  Food Insecurity: No Food Insecurity (12/17/2021)   Hunger Vital Sign    Worried About Running Out of Food in the Last Year: Never true    Ran Out of Food in the Last Year: Never true  Transportation Needs: No Transportation Needs (12/17/2021)   PRAPARE - THydrologist(Medical): No    Lack of Transportation (Non-Medical): No  Physical Activity: Not on file  Stress: Not on file  Social Connections: Not on file  Intimate Partner Violence: Not At Risk (12/17/2021)   Humiliation, Afraid, Rape, and Kick questionnaire    Fear of Current or Ex-Partner: No    Emotionally Abused: No    Physically Abused: No    Sexually Abused: No    Family History  Problem Relation Age of Onset   Hypertension Mother    Hyperlipidemia Mother    Heart disease Father    Ulcers Father  Bipolar disorder Brother    Colon cancer Neg Hx    Colon polyps Neg Hx    Esophageal cancer Neg Hx    Rectal cancer Neg Hx    Stomach cancer Neg Hx     ROS: no fevers or chills, productive cough, hemoptysis, dysphasia, odynophagia, melena, hematochezia, dysuria, hematuria, rash, seizure activity, orthopnea, PND, claudication. Remaining systems are negative.  Physical Exam: Well-developed well-nourished in no acute distress.  Skin is warm and dry.  HEENT is normal.  Neck is supple.  Chest is clear to auscultation with normal expansion.  Cardiovascular exam is regular rate and rhythm.  3/6 systolic murmur left sternal border. Abdominal exam nontender or distended. No masses palpated.  Right groin with no hematoma and no bruit. Extremities show 2+ ankle edema. neuro grossly intact  A/P  1 aortic stenosis-appears moderate on most recent echocardiogram though calcium score of  aortic valve elevated at 2791 suggesting more severe.  Patient does have dyspnea but also was noted to have pulmonary fibrosis on CT. I will plan to repeat his echocardiogram to reassess aortic stenosis.  Low threshold to refer for TAVR.  He has poor pulmonary reserve and would not tolerate any degree of CHF.  2 paroxysmal atrial fibrillation-patient has moderate varix on prior EGD and is at increased risk of GI bleed.  He is now status post watchman.  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  Continue diuretic and Jardiance at present dose.  4 coronary artery disease-continue statin.  He is not on aspirin given need for chronic anticoagulation.  5 hypertension-patient's blood pressure is controlled.  Continue present medications.  6 hyperlipidemia-continue statin.  7 lung nodule-plan noncontrast chest CT January 2024.  8 interstitial lung disease-follow-up pulmonary.  9 cirrhosis-followed by gastroenterology.  Patient noted to have moderate varix on previous EGD.  Kirk Ruths, MD

## 2021-12-16 ENCOUNTER — Other Ambulatory Visit (HOSPITAL_COMMUNITY): Payer: Self-pay

## 2021-12-16 NOTE — Telephone Encounter (Signed)
Confirmed procedure date of TOMORROW, 12/17/2021. Confirmed arrival time of 0700 for procedure time at 0930. Reviewed pre-procedure instructions with patient. Confirmed he stopped his Jardiance after Sunday's dose. The patient understands to call if questions/concerns arise prior to procedure.

## 2021-12-16 NOTE — Telephone Encounter (Signed)
Subjective:  Patient called today by [facility] Pulmonary pharmacy team for Esbriet new start.   Patient was last seen by Dr. Vaughan Browner on 11/23/21.  Pertinent past medical history includes HTN, MI, CHF, HLD, Afib. Prior therapy includes none.  History of elevated LFTs:No History of diarrhea, nausea, vomiting: No  Objective: No Known Allergies  Outpatient Encounter Medications as of 12/02/2021  Medication Sig Note   acetaminophen (TYLENOL) 500 MG tablet Take 1,000 mg by mouth daily as needed for moderate pain or headache.    atorvastatin (LIPITOR) 80 MG tablet TAKE 1 TABLET BY MOUTH EVERY DAY    carvedilol (COREG) 3.125 MG tablet TAKE 1 TABLET BY MOUTH TWICE A DAY WITH FOOD    CVS VITAMIN B12 1000 MCG tablet TAKE 1 TABLET BY MOUTH EVERY DAY    ELIQUIS 5 MG TABS tablet TAKE 1 TABLET BY MOUTH TWICE A DAY    empagliflozin (JARDIANCE) 10 MG TABS tablet Take 1 tablet (10 mg total) by mouth daily before breakfast.    furosemide (LASIX) 40 MG tablet Take 2 tablets (80 mg total) by mouth 2 (two) times daily.    KLOR-CON M20 20 MEQ tablet TAKE 1 TABLET BY MOUTH EVERY DAY    [START ON 12/28/2021] Pirfenidone (ESBRIET) 267 MG TABS Take 3 tablets (801 mg total) by mouth 3 (three) times daily with meals. Month 2 and onwards (maintenance) 12/16/2021: Will start after procedure 12/17/2021    tamsulosin (FLOMAX) 0.4 MG CAPS capsule TAKE ONE CAPSULE BY MOUTH EVERY DAY AFTER SUPPER    No facility-administered encounter medications on file as of 12/02/2021.     Immunization History  Administered Date(s) Administered   Fluad Quad(high Dose 65+) 11/29/2018, 11/30/2019, 11/06/2020, 11/23/2021   Hep A / Hep B 10/10/2020   Hepatitis A, Adult 11/10/2020   Hepb-cpg 11/10/2020, 12/11/2020, 05/11/2021   Influenza Split 10/26/2011, 11/01/2017   Influenza Whole 12/03/2008   PFIZER(Purple Top)SARS-COV-2 Vaccination 04/16/2019, 05/08/2019   Pneumococcal Conjugate-13 07/04/2017   Pneumococcal Polysaccharide-23  11/29/2018   Tdap 09/29/2010, 02/12/2021      PFT's TLC  Date Value Ref Range Status  11/20/2021 5.76 L Final      CMP     Component Value Date/Time   NA 139 11/30/2021 1316   K 4.3 11/30/2021 1316   CL 106 11/30/2021 1316   CO2 21 11/30/2021 1316   GLUCOSE 111 (H) 11/30/2021 1316   GLUCOSE 145 (H) 05/20/2021 1234   BUN 14 11/30/2021 1316   CREATININE 0.81 11/30/2021 1316   CREATININE 0.82 10/16/2020 1437   CREATININE 0.81 11/30/2019 0929   CALCIUM 9.0 11/30/2021 1316   PROT 7.3 10/02/2021 1449   PROT 6.7 07/23/2019 1144   ALBUMIN 3.1 (L) 10/02/2021 1449   ALBUMIN 3.5 (L) 07/23/2019 1144   AST 28 10/02/2021 1449   AST 34 10/16/2020 1437   ALT 16 10/02/2021 1449   ALT 20 10/16/2020 1437   ALKPHOS 69 10/02/2021 1449   BILITOT 2.7 (H) 10/02/2021 1449   BILITOT 2.1 (H) 10/16/2020 1437   GFRNONAA >60 05/20/2021 1234   GFRNONAA >60 10/16/2020 1437   GFRAA 101 11/22/2019 1228      CBC    Component Value Date/Time   WBC 4.4 11/30/2021 1316   WBC 5.5 05/20/2021 1234   RBC 4.07 (L) 11/30/2021 1316   RBC 3.79 (L) 05/20/2021 1234   HGB 13.0 11/30/2021 1316   HCT 38.4 11/30/2021 1316   PLT 88 (LL) 11/30/2021 1316   MCV 94 11/30/2021 1316  MCH 31.9 11/30/2021 1316   MCH 30.9 05/20/2021 1234   MCHC 33.9 11/30/2021 1316   MCHC 32.8 05/20/2021 1234   RDW 14.6 11/30/2021 1316   LYMPHSABS 1.3 05/20/2021 1234   MONOABS 0.4 05/20/2021 1234   EOSABS 0.2 05/20/2021 1234   BASOSABS 0.0 05/20/2021 1234      LFT's    Latest Ref Rng & Units 10/02/2021    2:49 PM 05/20/2021   12:34 PM 02/12/2021    2:38 PM  Hepatic Function  Total Protein 6.0 - 8.3 g/dL 7.3  7.0  7.3   Albumin 3.5 - 5.2 g/dL 3.1  2.5  3.4   AST 0 - 37 U/L 28  44  29   ALT 0 - 53 U/L _0 Alk Phosphatase 39 - 117 U/L 69  58  80   Total Bilirubin 0.2 - 1.2 mg/dL 2.7  2.6  2.5   Bilirubin, Direct 0.0 - 0.3 mg/dL 0.6        HRCT (10/26/2021)  Pulmonary parenchymal pattern of fibrosis is likely  due to usual interstitial pneumonitis. Findings are categorized as probable UIP per consensus guidelines: Diagnosis of Idiopathic Pulmonary Fibrosis: An Official ATS/ERS/JRS/ALAT Clinical Practice Guideline.  Assessment and Plan  Esbriet Medication Management Thoroughly counseled patient on the efficacy, mechanism of action, dosing, administration, adverse effects, and monitoring parameters of Esbriet.  Patient verbalized understanding.   Goals of Therapy: Will not stop or reverse the progression of ILD. It will slow the progression of ILD.   Dosing: Starting dose will be Esbriet 267 mg 1 tablet three times daily for 7 days, then 2 tablets three times daily for 7 days, then 3 tablets three times daily.  Maintenance dose will be 801 mg 1 tablet three times daily if tolerated.  Stressed the importance of taking with meals and space at least 5-6 hours apart to minimize stomach upset.   Adverse Effects: Nausea, vomiting, diarrhea, weight loss Abdominal pain GERD Sun sensitivity/rash - patient advised to wear sunscreen when exposed to sunlight Dizziness Fatigue  Monitoring: Monitor for diarrhea, nausea and vomiting, GI perforation, hepatotoxicity  Monitor LFTs - baseline, monthly for first 6 months, then every 3 months routinely CBC w differential at baseline and every 3 months routinely  Access: Approval of Esbriet through: insurance Rx sent to: [facility]: [phone]   Medication Reconciliation A drug regimen assessment was performed, including review of allergies, interactions, disease-state management, dosing and immunization history. Medications were reviewed with the patient, including name, instructions, indication, goals of therapy, potential side effects, importance of adherence, and safe use.  Immunizations Patient is indicated for the influenzae, pneumonia, and shingles vaccinations. Patient has received 2 COVID19 vaccines.  Thank you for involving pharmacy to  assist in providing this patient's care.   Maryan Puls, PharmD PGY-1 [facility] Pharmacy Resident

## 2021-12-17 ENCOUNTER — Inpatient Hospital Stay (HOSPITAL_COMMUNITY): Payer: 59 | Admitting: Certified Registered Nurse Anesthetist

## 2021-12-17 ENCOUNTER — Other Ambulatory Visit: Payer: Self-pay

## 2021-12-17 ENCOUNTER — Inpatient Hospital Stay (HOSPITAL_COMMUNITY)
Admission: RE | Admit: 2021-12-17 | Discharge: 2021-12-18 | DRG: 273 | Disposition: A | Discharge status: Home / Self Care | Payer: 59 | Source: Ambulatory Visit | Attending: Cardiovascular Disease | Admitting: Cardiovascular Disease

## 2021-12-17 ENCOUNTER — Encounter (HOSPITAL_COMMUNITY): Payer: Self-pay | Admitting: Cardiovascular Disease

## 2021-12-17 ENCOUNTER — Inpatient Hospital Stay (HOSPITAL_COMMUNITY)
Admission: RE | Admit: 2021-12-17 | Discharge: 2021-12-17 | Disposition: A | Discharge status: Home / Self Care | Payer: 59 | Source: Ambulatory Visit | Attending: Cardiovascular Disease | Admitting: Cardiovascular Disease

## 2021-12-17 ENCOUNTER — Inpatient Hospital Stay (HOSPITAL_COMMUNITY): Payer: 59

## 2021-12-17 ENCOUNTER — Encounter (HOSPITAL_COMMUNITY)
Admission: RE | Disposition: A | Discharge status: Home / Self Care | Payer: Self-pay | Source: Ambulatory Visit | Attending: Cardiovascular Disease

## 2021-12-17 DIAGNOSIS — Z006 Encounter for examination for normal comparison and control in clinical research program: Secondary | ICD-10-CM | POA: Diagnosis not present

## 2021-12-17 DIAGNOSIS — I2510 Atherosclerotic heart disease of native coronary artery without angina pectoris: Secondary | ICD-10-CM | POA: Diagnosis present

## 2021-12-17 DIAGNOSIS — K7460 Unspecified cirrhosis of liver: Secondary | ICD-10-CM | POA: Diagnosis present

## 2021-12-17 DIAGNOSIS — I130 Hypertensive heart and chronic kidney disease with heart failure and stage 1 through stage 4 chronic kidney disease, or unspecified chronic kidney disease: Secondary | ICD-10-CM | POA: Diagnosis present

## 2021-12-17 DIAGNOSIS — R0902 Hypoxemia: Secondary | ICD-10-CM | POA: Insufficient documentation

## 2021-12-17 DIAGNOSIS — I252 Old myocardial infarction: Secondary | ICD-10-CM

## 2021-12-17 DIAGNOSIS — R161 Splenomegaly, not elsewhere classified: Secondary | ICD-10-CM | POA: Diagnosis present

## 2021-12-17 DIAGNOSIS — D696 Thrombocytopenia, unspecified: Secondary | ICD-10-CM | POA: Diagnosis present

## 2021-12-17 DIAGNOSIS — F4321 Adjustment disorder with depressed mood: Secondary | ICD-10-CM | POA: Diagnosis present

## 2021-12-17 DIAGNOSIS — I480 Paroxysmal atrial fibrillation: Secondary | ICD-10-CM

## 2021-12-17 DIAGNOSIS — I8500 Esophageal varices without bleeding: Secondary | ICD-10-CM | POA: Diagnosis present

## 2021-12-17 DIAGNOSIS — I4891 Unspecified atrial fibrillation: Secondary | ICD-10-CM | POA: Diagnosis present

## 2021-12-17 DIAGNOSIS — I5033 Acute on chronic diastolic (congestive) heart failure: Secondary | ICD-10-CM | POA: Diagnosis present

## 2021-12-17 DIAGNOSIS — I350 Nonrheumatic aortic (valve) stenosis: Secondary | ICD-10-CM

## 2021-12-17 DIAGNOSIS — J8410 Pulmonary fibrosis, unspecified: Secondary | ICD-10-CM | POA: Insufficient documentation

## 2021-12-17 DIAGNOSIS — I10 Essential (primary) hypertension: Secondary | ICD-10-CM | POA: Diagnosis present

## 2021-12-17 DIAGNOSIS — Z951 Presence of aortocoronary bypass graft: Secondary | ICD-10-CM | POA: Diagnosis not present

## 2021-12-17 DIAGNOSIS — Z95818 Presence of other cardiac implants and grafts: Secondary | ICD-10-CM

## 2021-12-17 DIAGNOSIS — E785 Hyperlipidemia, unspecified: Secondary | ICD-10-CM | POA: Diagnosis present

## 2021-12-17 DIAGNOSIS — I509 Heart failure, unspecified: Secondary | ICD-10-CM

## 2021-12-17 DIAGNOSIS — Z7901 Long term (current) use of anticoagulants: Secondary | ICD-10-CM

## 2021-12-17 DIAGNOSIS — Z79899 Other long term (current) drug therapy: Secondary | ICD-10-CM | POA: Diagnosis not present

## 2021-12-17 DIAGNOSIS — K766 Portal hypertension: Secondary | ICD-10-CM | POA: Diagnosis present

## 2021-12-17 DIAGNOSIS — N181 Chronic kidney disease, stage 1: Secondary | ICD-10-CM | POA: Diagnosis present

## 2021-12-17 DIAGNOSIS — Z597 Insufficient social insurance and welfare support: Secondary | ICD-10-CM

## 2021-12-17 DIAGNOSIS — I5042 Chronic combined systolic (congestive) and diastolic (congestive) heart failure: Secondary | ICD-10-CM | POA: Diagnosis present

## 2021-12-17 DIAGNOSIS — R0989 Other specified symptoms and signs involving the circulatory and respiratory systems: Secondary | ICD-10-CM | POA: Diagnosis present

## 2021-12-17 DIAGNOSIS — Z8249 Family history of ischemic heart disease and other diseases of the circulatory system: Secondary | ICD-10-CM | POA: Diagnosis not present

## 2021-12-17 DIAGNOSIS — N189 Chronic kidney disease, unspecified: Secondary | ICD-10-CM | POA: Diagnosis not present

## 2021-12-17 DIAGNOSIS — I4892 Unspecified atrial flutter: Secondary | ICD-10-CM | POA: Diagnosis present

## 2021-12-17 DIAGNOSIS — R911 Solitary pulmonary nodule: Secondary | ICD-10-CM

## 2021-12-17 HISTORY — PX: LEFT ATRIAL APPENDAGE OCCLUSION: EP1229

## 2021-12-17 HISTORY — PX: TEE WITHOUT CARDIOVERSION: SHX5443

## 2021-12-17 HISTORY — DX: Presence of other cardiac implants and grafts: Z95.818

## 2021-12-17 LAB — SURGICAL PCR SCREEN
MRSA, PCR: NEGATIVE
Staphylococcus aureus: NEGATIVE

## 2021-12-17 LAB — CBC
HCT: 40 % (ref 39.0–52.0)
Hemoglobin: 13.1 g/dL (ref 13.0–17.0)
MCH: 31.4 pg (ref 26.0–34.0)
MCHC: 32.8 g/dL (ref 30.0–36.0)
MCV: 95.9 fL (ref 80.0–100.0)
Platelets: 76 10*3/uL — ABNORMAL LOW (ref 150–400)
RBC: 4.17 MIL/uL — ABNORMAL LOW (ref 4.22–5.81)
RDW: 16 % — ABNORMAL HIGH (ref 11.5–15.5)
WBC: 4.2 10*3/uL (ref 4.0–10.5)
nRBC: 0 % (ref 0.0–0.2)

## 2021-12-17 LAB — ECHO TEE
AR max vel: 1.07 cm2
AV Area VTI: 1.06 cm2
AV Area mean vel: 1.05 cm2
AV Mean grad: 32 mmHg
AV Peak grad: 47.9 mmHg
Ao pk vel: 3.46 m/s

## 2021-12-17 LAB — POCT ACTIVATED CLOTTING TIME
Activated Clotting Time: 335 seconds
Activated Clotting Time: 269 seconds

## 2021-12-17 LAB — ABO/RH: ABO/RH(D): A POS

## 2021-12-17 LAB — TYPE AND SCREEN
ABO/RH(D): A POS
Antibody Screen: NEGATIVE

## 2021-12-17 LAB — PROTIME-INR
INR: 1.9 — ABNORMAL HIGH (ref 0.8–1.2)
Prothrombin Time: 21.3 seconds — ABNORMAL HIGH (ref 11.4–15.2)

## 2021-12-17 SURGERY — LEFT ATRIAL APPENDAGE OCCLUSION
Anesthesia: General

## 2021-12-17 MED ORDER — ONDANSETRON HCL 4 MG/2ML IJ SOLN: 4.0000 mg | Freq: Four times a day (QID) | INTRAMUSCULAR | Status: DC | PRN

## 2021-12-17 MED ORDER — HEPARIN (PORCINE) IN NACL 1000-0.9 UT/500ML-% IV SOLN
INTRAVENOUS | Status: AC
  Filled 2021-12-17: qty 500

## 2021-12-17 MED ORDER — SODIUM CHLORIDE 0.9 % IV SOLN: INTRAVENOUS | Status: DC

## 2021-12-17 MED ORDER — ACETAMINOPHEN 500 MG PO TABS
ORAL_TABLET | ORAL | Status: AC
  Administered 2021-12-17: 1000 mg via ORAL
  Filled 2021-12-17: qty 2

## 2021-12-17 MED ORDER — CEFAZOLIN SODIUM-DEXTROSE 2-4 GM/100ML-% IV SOLN
2.0000 g | INTRAVENOUS | Status: AC
  Administered 2021-12-17: 2 g via INTRAVENOUS
  Filled 2021-12-17: qty 100

## 2021-12-17 MED ORDER — FUROSEMIDE 10 MG/ML IJ SOLN
80.0000 mg | Freq: Once | INTRAMUSCULAR | Status: AC
  Administered 2021-12-17: 80 mg via INTRAVENOUS
  Filled 2021-12-17: qty 8

## 2021-12-17 MED ORDER — HEPARIN SODIUM (PORCINE) 1000 UNIT/ML IJ SOLN
INTRAMUSCULAR | Status: DC | PRN
  Administered 2021-12-17: 14000 [IU] via INTRAVENOUS

## 2021-12-17 MED ORDER — PHENYLEPHRINE HCL-NACL 20-0.9 MG/250ML-% IV SOLN
INTRAVENOUS | Status: DC | PRN
  Administered 2021-12-17: 40 ug/min via INTRAVENOUS

## 2021-12-17 MED ORDER — CHLORHEXIDINE GLUCONATE 0.12 % MT SOLN
OROMUCOSAL | Status: AC
  Administered 2021-12-17: 15 mL
  Filled 2021-12-17: qty 15

## 2021-12-17 MED ORDER — IOHEXOL 350 MG/ML SOLN
INTRAVENOUS | Status: DC | PRN
  Administered 2021-12-17 (×2): 10 mL

## 2021-12-17 MED ORDER — SODIUM CHLORIDE 0.9% FLUSH
3.0000 mL | Freq: Two times a day (BID) | INTRAVENOUS | Status: DC
  Administered 2021-12-17 (×2): 3 mL via INTRAVENOUS

## 2021-12-17 MED ORDER — FUROSEMIDE 10 MG/ML IJ SOLN
INTRAMUSCULAR | Status: AC
  Filled 2021-12-17: qty 4

## 2021-12-17 MED ORDER — ACETAMINOPHEN 500 MG PO TABS: 1000.0000 mg | ORAL_TABLET | Freq: Once | ORAL | Status: AC

## 2021-12-17 MED ORDER — EPHEDRINE SULFATE-NACL 50-0.9 MG/10ML-% IV SOSY
PREFILLED_SYRINGE | INTRAVENOUS | Status: DC | PRN
  Administered 2021-12-17: 5 mg via INTRAVENOUS
  Administered 2021-12-17 (×2): 2.5 mg via INTRAVENOUS
  Administered 2021-12-17: 5 mg via INTRAVENOUS

## 2021-12-17 MED ORDER — PHENYLEPHRINE 80 MCG/ML (10ML) SYRINGE FOR IV PUSH (FOR BLOOD PRESSURE SUPPORT)
PREFILLED_SYRINGE | INTRAVENOUS | Status: DC | PRN
  Administered 2021-12-17: 160 ug via INTRAVENOUS
  Administered 2021-12-17: 80 ug via INTRAVENOUS
  Administered 2021-12-17: 160 ug via INTRAVENOUS
  Administered 2021-12-17: 80 ug via INTRAVENOUS
  Administered 2021-12-17: 160 ug via INTRAVENOUS
  Administered 2021-12-17 (×4): 80 ug via INTRAVENOUS

## 2021-12-17 MED ORDER — SODIUM CHLORIDE 0.9% FLUSH: 3.0000 mL | INTRAVENOUS | Status: DC | PRN

## 2021-12-17 MED ORDER — HEPARIN (PORCINE) IN NACL 1000-0.9 UT/500ML-% IV SOLN
INTRAVENOUS | Status: DC | PRN
  Administered 2021-12-17: 500 mL

## 2021-12-17 MED ORDER — SODIUM CHLORIDE 0.9 % IV SOLN: 250.0000 mL | INTRAVENOUS | Status: DC | PRN

## 2021-12-17 MED ORDER — SUGAMMADEX SODIUM 200 MG/2ML IV SOLN
INTRAVENOUS | Status: DC | PRN
  Administered 2021-12-17 (×4): 100 mg via INTRAVENOUS

## 2021-12-17 MED ORDER — CHLORHEXIDINE GLUCONATE 4 % EX LIQD: Freq: Once | CUTANEOUS | Status: DC

## 2021-12-17 MED ORDER — HEPARIN (PORCINE) IN NACL 2000-0.9 UNIT/L-% IV SOLN
INTRAVENOUS | Status: DC | PRN
  Administered 2021-12-17 (×2): 1000 mL

## 2021-12-17 MED ORDER — ONDANSETRON HCL 4 MG/2ML IJ SOLN
INTRAMUSCULAR | Status: DC | PRN
  Administered 2021-12-17: 4 mg via INTRAVENOUS

## 2021-12-17 MED ORDER — PROPOFOL 10 MG/ML IV BOLUS
INTRAVENOUS | Status: DC | PRN
  Administered 2021-12-17: 120 mg via INTRAVENOUS

## 2021-12-17 MED ORDER — HEPARIN (PORCINE) IN NACL 2000-0.9 UNIT/L-% IV SOLN
INTRAVENOUS | Status: AC
  Filled 2021-12-17: qty 1000

## 2021-12-17 MED ORDER — LIDOCAINE 2% (20 MG/ML) 5 ML SYRINGE
INTRAMUSCULAR | Status: DC | PRN
  Administered 2021-12-17: 60 mg via INTRAVENOUS

## 2021-12-17 MED ORDER — PROTAMINE SULFATE 10 MG/ML IV SOLN
INTRAVENOUS | Status: DC | PRN
  Administered 2021-12-17: 30 mg via INTRAVENOUS

## 2021-12-17 MED ORDER — FUROSEMIDE 10 MG/ML IJ SOLN
40.0000 mg | Freq: Once | INTRAMUSCULAR | Status: AC
  Administered 2021-12-17: 40 mg via INTRAVENOUS

## 2021-12-17 MED ORDER — DEXAMETHASONE SODIUM PHOSPHATE 10 MG/ML IJ SOLN
INTRAMUSCULAR | Status: DC | PRN
  Administered 2021-12-17: 5 mg via INTRAVENOUS

## 2021-12-17 MED ORDER — ACETAMINOPHEN 325 MG PO TABS: 650.0000 mg | ORAL_TABLET | ORAL | Status: DC | PRN

## 2021-12-17 MED ORDER — FENTANYL CITRATE (PF) 100 MCG/2ML IJ SOLN
INTRAMUSCULAR | Status: DC | PRN
  Administered 2021-12-17 (×2): 25 ug via INTRAVENOUS
  Administered 2021-12-17: 50 ug via INTRAVENOUS

## 2021-12-17 MED ORDER — LACTATED RINGERS IV SOLN: INTRAVENOUS | Status: DC

## 2021-12-17 MED ORDER — ROCURONIUM BROMIDE 10 MG/ML (PF) SYRINGE
PREFILLED_SYRINGE | INTRAVENOUS | Status: DC | PRN
  Administered 2021-12-17: 30 mg via INTRAVENOUS
  Administered 2021-12-17: 20 mg via INTRAVENOUS
  Administered 2021-12-17: 10 mg via INTRAVENOUS
  Administered 2021-12-17: 50 mg via INTRAVENOUS

## 2021-12-17 SURGICAL SUPPLY — 21 items
BLANKET WARM UNDERBOD FULL ACC (MISCELLANEOUS) ×1 IMPLANT
CATH DIAG 6FR PIGTAIL ANGLED (CATHETERS) IMPLANT
CLOSURE PERCLOSE PROSTYLE (VASCULAR PRODUCTS) IMPLANT
DEVICE WATCHMAN FLX PROC (KITS) IMPLANT
DILATOR VESSEL 38 20CM 11FR (INTRODUCER) IMPLANT
KIT HEART LEFT (KITS) ×1 IMPLANT
KIT SHEA VERSACROSS LAAC CONNE (KITS) IMPLANT
PACK CARDIAC CATHETERIZATION (CUSTOM PROCEDURE TRAY) ×1 IMPLANT
PAD DEFIB RADIO PHYSIO CONN (PAD) ×1 IMPLANT
SHEATH PERFORMER 16FR 30 (SHEATH) IMPLANT
SHEATH PINNACLE 8F 10CM (SHEATH) IMPLANT
SHEATH PROBE COVER 6X72 (BAG) ×1 IMPLANT
SHIELD RADPAD SCOOP 12X17 (MISCELLANEOUS) ×1 IMPLANT
SYS WATCHMAN FXD DBL (SHEATH) ×1
SYSTEM WATCHMAN FXD DBL (SHEATH) IMPLANT
TRANSDUCER W/STOPCOCK (MISCELLANEOUS) ×1 IMPLANT
TUBING CIL FLEX 10 FLL-RA (TUBING) ×1 IMPLANT
WATCHMAN FLX 24 (Prosthesis & Implant Heart) IMPLANT
WATCHMAN FLX 27 (Prosthesis & Implant Heart) IMPLANT
WATCHMAN FLX PROCEDURE DEVICE (KITS) ×1 IMPLANT
WATCHMAN PROCED TRUSEAL ACCESS (SHEATH) IMPLANT

## 2021-12-17 NOTE — Progress Notes (Signed)
Follow-up evaluation: The patient feels well.  He is having no chest pain.  His right groin site is clear.  However, his oxygen saturation is marginal.  I turned his supplemental oxygen off and his saturation at rest is ranging 87 to 88%.  He has underlying pulmonary fibrosis and he had high left atrial pressure at the time of his watchman implant today.  I think it is safest to keep him overnight.  I am going to give him furosemide 80 mg IV x1.  We will reassess things in the morning and ask for respiratory to evaluate him for home O2 needs.  He may require further diuresis as well.  Plan explained to the patient and he understands and is comfortable staying the night in the hospital.  [name] 12/17/2021 5:10 PM

## 2021-12-17 NOTE — Discharge Summary (Addendum)
[facility]    [name],  MRN: [id number], DOB/AGE: 1950-10-23 71 y.o.  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 Burt Knack.   Successful left atrial appendage occlusion with a 27 mm watchman flex device under fluoroscopic and TEE guidance   Recommend: Apixaban 5 mg BID x 6 weeks, then ASA 81 mg and plavix 75 mg daily through 6 months Cardiac CTA 8 weeks for post-implant surveillance   Brief HPI: [name] is a 71 y.o. male with a history of paroxysmal and nonalcoholic cirrhosis with splenomegaly, portal venous hypertension, and recently diagnosed ILD who was felt to be high risk for bleeding with long term [place] and was therefore referred to Dr. Burt Knack for [place] evaluation.   [name] has a long hx of  cirrhosis complicated by thrombocytopenia and is followed closely by GI. Additionally he is followed by pulmonology due to interstitial lung disease. He has undergone high-resolution CT scanning and PFTs are pending. Dr. Burt Knack spoke briefly to him about his moderate AS and possible TAVR in Tony future which will be monitored with surveillance imaging for now.    Pre Watchman CT showed anatomy suitable for LAAO closure and is scheduled for 12/17/21 with Dr. Burt Knack.   Hospital Course:  [name] was admitted and underwent left atrial appendage occlusive device  placement with 58m Watchman device.  He was monitored on telemetry in Tony post operative. Groin site has been without complication. Given Tony patients stability, he is being considered for same day discharge. Wound care and restrictions were reviewed with [name]. [name] has been scheduled for post procedure follow up with JKathyrn Drown NP in 1 month. Medication plan will be to resume Eliquis later today x 6 weeks (12/31), then ASA 81 mg and Plavix 75 mg daily through 6 months. A repeat CT will be performed at approximately 8 weeks for post-implant surveillance.   ADDEND: [name] was ready for discharge however was noted to be hypoxic on MD assessment despite supplemental oxygen. He was treated with an additional dose of IV Lasix at '80mg'$  with response. Pt recently diagnosed with pulmonary fibrosis and was prescribed Esbriet however has not yet started due to insurance coverage. Pulmonologist contacted to inform Tony need for home oxygen. Will RX HH supplemental O2 2L. He has close follow up with their team however will reach out to see if this needs to be moved forward. Also will schedule a follow up with Dr. KYolonda Kida(sleep) as [name] reports his CPAP mask is ill fitting.   Acute on chronic diastolic HF: Treated with IV Lasix. Remains mildly hypoxic today with saturations in Tony 89-91% range on 1-2L. CXR today with chronic interstitial changes and mild pulmonary vascular congestion. Recently dx of ILD, likely at his baseline. Will send home on supplemental O2. Pulmonary team made aware. Lower extremity edema improved.   Pulmonary fibrosiswith hypoxia: Recently seen by pulmonary and dx with ILD and prescribed Esbriet. CXR with chronic interstitial changes and mild pulmonary  vascular congestion. Plan to send home with supplemental O2 2L. Follow with pulmonary medicine.    Nonalcoholic cirrhosis with splenomegaly and portal venous hypertension: Followed closely by GI and felt to be high risk for  long term anticoagulation.    Moderate aortic stenosis: Last echocardiogram 07/2021 with thickened and restricted AV. Peak gradient at 4mHg, mean at 393mg with AVA by VTI at 1.40 cm2  and DI at 0.34 consistent with moderate AS. Continue to monitor with surveillance imaging. Briefly discussed potential TAVR in Tony future when AS becomes symptomatic. Likely not a Progress Trial candidate given extensive lung a GI issues. He is stable with NYHA class I symptoms   Incidental findings: 1.9 x 0.8 cm nodular opacity in Tony left lower lobe. Recommend PET-CT or follow-up chest CT in 3 months (to be done with 60-day CT). AV calcium score = 2784, plan echo at 6 mo visit. Follows closely with pulmonology for newly dx ILD. Will notify them.    Physical Exam: Vitals:   12/17/21 1733 12/17/21 2300 12/18/21 0400 12/18/21 0746  BP:  (!) 104/45 (!) 94/41 (!) 97/46  Pulse: 67 66 60 62  Resp: '20 20 18 20  '$ Temp:  98.4 F (36.9 C)  98.4 F (36.9 C)  TempSrc:  Oral  Oral  SpO2: 91% 91% 90% 90%  Weight:      Height:       General: Well developed, well nourished, NAD Lungs:Clear to ausculation bilaterally. No wheezes, rales, or rhonchi. Breathing is unlabored. Cardiovascular: RRR with S1 S2. No murmurs, rubs, gallops, or LV heave appreciated. Extremities: No edema.  Neuro: Alert and oriented. No focal deficits. No facial asymmetry. MAE spontaneously. Psych: Responds to questions appropriately with normal affect.    Labs:   Lab Results  Component Value Date   WBC 4.2 12/17/2021   HGB 13.1 12/17/2021   HCT 40.0 12/17/2021   MCV 95.9 12/17/2021   PLT 76 (L) 12/17/2021    Recent Labs  Lab 12/18/21 0133  NA 139  K 3.8  CL 107  CO2 24  BUN 16  CREATININE 1.08  CALCIUM 8.2*  GLUCOSE 139*   Discharge Medications:  Allergies as of 12/18/2021   No Known Allergies      Medication List     TAKE these medications    acetaminophen 500 MG tablet Commonly known as: TYLENOL Take 1,000 mg by  mouth daily as needed for moderate pain or headache.   atorvastatin 80 MG tablet Commonly known as: LIPITOR TAKE 1 TABLET BY MOUTH EVERY DAY   carvedilol 3.125 MG tablet Commonly known as: COREG TAKE 1 TABLET BY MOUTH TWICE A DAY WITH FOOD   [facility] Vitamin B12 1000 MCG tablet Generic drug: cyanocobalamin TAKE 1 TABLET BY MOUTH EVERY DAY   Eliquis 5 MG Tabs tablet Generic drug: apixaban TAKE 1 TABLET BY MOUTH TWICE A DAY Notes to Foster: Restart Eliquis today 12/17/21   empagliflozin 10 MG Tabs tablet Commonly known as: Jardiance Take 1 tablet (10 mg total) by mouth daily before breakfast.   furosemide 40 MG tablet Commonly known as: LASIX Take 2 tablets (80 mg total) by mouth 2 (two) times daily.   Klor-Con M20 20 MEQ tablet Generic drug: potassium chloride SA TAKE 1 TABLET BY MOUTH EVERY DAY   LIVER PROTECT PO Take 1 capsule by mouth daily. From [employer]   Pirfenidone 267 MG Tabs Commonly known as: Esbriet Take 3 tablets (801 mg total) by mouth 3 (three) times daily with  meals. Month 2 and onwards (maintenance) Start taking on: December 28, 2021   tamsulosin 0.4 MG Caps capsule Commonly known as: FLOMAX TAKE ONE CAPSULE BY MOUTH EVERY DAY AFTER SUPPER               Durable Medical Equipment  (From admission, onward)           Start     Ordered   12/18/21 0936  DME Oxygen  Once       Question Answer Comment  Length of Need 6 Months   Mode or (Route) Nasal cannula   Liters per Minute 2   Frequency Continuous (stationary and portable oxygen unit needed)   Oxygen conserving device Yes   Oxygen delivery system Gas      12/18/21 0936   12/18/21 0919  For home use only DME oxygen  Once       Question Answer Comment  Length of Need 6 Months   Mode or (Route) Nasal cannula   Liters per Minute 2   Frequency Continuous (stationary and portable oxygen unit needed)   Oxygen conserving device Yes   Oxygen delivery system Gas      12/18/21 0918            Disposition:  Home  Discharge Instructions     Call MD for:  difficulty breathing, headache or visual disturbances   Complete by: As directed    Call MD for:  difficulty breathing, headache or visual disturbances   Complete by: As directed    Call MD for:  extreme fatigue   Complete by: As directed    Call MD for:  extreme fatigue   Complete by: As directed    Call MD for:  hives   Complete by: As directed    Call MD for:  hives   Complete by: As directed    Call MD for:  persistant dizziness or light-headedness   Complete by: As directed    Call MD for:  persistant dizziness or light-headedness   Complete by: As directed    Call MD for:  persistant nausea and vomiting   Complete by: As directed    Call MD for:  persistant nausea and vomiting   Complete by: As directed    Call MD for:  redness, tenderness, or signs of infection (pain, swelling, redness, odor or green/yellow discharge around incision site)   Complete by: As directed    Call MD for:  redness, tenderness, or signs of infection (pain, swelling, redness, odor or green/yellow discharge around incision site)   Complete by: As directed    Call MD for:  severe uncontrolled pain   Complete by: As directed    Call MD for:  severe uncontrolled pain   Complete by: As directed    Call MD for:  temperature >100.4   Complete by: As directed    Call MD for:  temperature >100.4   Complete by: As directed    Diet - low sodium heart healthy   Complete by: As directed    Diet - low sodium heart healthy   Complete by: As directed    Discharge instructions   Complete by: As directed    [facility] Procedure, Care After  Procedure MD: Dr. Armandina Stammer Clinical Coordinator: Lenice Llamas, RN  This sheet gives you information about how to care for yourself after your procedure. Your health care provider may also give you more specific instructions. If you have problems or questions, contact your  health care  provider.  What can I expect after Tony procedure? After Tony procedure, it is common to have: Bruising around your puncture site. Tenderness around your puncture site. Tiredness (fatigue).  Medication instructions It is very important to continue to take your blood thinner as directed by your doctor after Tony Watchman procedure. Call your procedure doctor's office with question or concerns. If you are on Coumadin (warfarin), you will have your INR checked Tony week after your procedure, with a goal INR of 2.0 - 3.0. Please follow your medication instructions on your discharge summary. Only take Tony medications listed on your discharge paperwork.  Follow up You will be seen in 1 month after your procedure You will have a repeat CT scan approximately 8 weeks after your procedure mark to check your device You will follow up the MD/APP who performed your procedure 6 months after your procedure Tony Watchman Clinical Coordinator will check in with you from time to time, including 1 and 2 years after your procedure.    Follow these instructions at home: Puncture site care  Follow instructions from your health care provider about how to take care of your puncture site. Make sure you: If present, leave stitches (sutures), skin glue, or adhesive strips in place.  If a large square bandage is present, this may be removed 24 hours after surgery.  Check your puncture site every day for signs of infection. Check for: Redness, swelling, or pain. Fluid or blood. If your puncture site starts to bleed, lie down on your back, apply firm pressure to Tony area, and contact your health care provider. Warmth. Pus or a bad smell. Driving Do not drive yourself home if you received sedation Do not drive for at least 4 days after your procedure or however long your health care provider recommends. (Do not resume driving if you have previously been instructed not to drive for other health reasons.) Do not spend  greater than 1 hour at a time in a car for Tony first 3 days. Stop and take a break with a 5 minute walk at least every hour.  Do not drive or use heavy machinery while taking prescription pain medicine.  Activity Avoid activities that take a lot of effort, including exercise, for at least 7 days after your procedure. For Tony first 3 days, avoid sitting for longer than one hour at a time.  Avoid alcoholic beverages, signing paperwork, or participating in legal proceedings for 24 hours after receiving sedation Do not lift anything that is heavier than 10 lb (4.5 kg) for one week.  No sexual activity for 1 week.  Return to your normal activities as told by your health care provider. Ask your health care provider what activities are safe for you. General instructions Take over-Tony-counter and prescription medicines only as told by your health care provider. Do not use any products that contain nicotine or tobacco, such as cigarettes and e-cigarettes. If you need help quitting, ask your health care provider. You may shower after 24 hours, but Do not take baths, swim, or use a hot tub for 1 week.  Do not drink alcohol for 24 hours after your procedure. Keep all follow-up visits as told by your health care provider. This is important. Dental Work: You will require antibiotics prior to any dental work, including cleanings, for 6 months after your Watchman implantation to help protect you from infection. After 6 months, antibiotics are no longer required. Contact a health care provider if: You have  redness, mild swelling, or pain around your puncture site. You have soreness in your throat or at your puncture site that does not improve after several days You have fluid or blood coming from your puncture site that stops after applying firm pressure to Tony area. Your puncture site feels warm to Tony touch. You have pus or a bad smell coming from your puncture site. You have a fever. You have chest pain or  discomfort that spreads to your neck, jaw, or arm. You are sweating a lot. You feel nauseous. You have a fast or irregular heartbeat. You have shortness of breath. You are dizzy or light-headed and feel Tony need to lie down. You have pain or numbness in Tony arm or leg closest to your puncture site. Get help right away if: Your puncture site suddenly swells. Your puncture site is bleeding and Tony bleeding does not stop after applying firm pressure to Tony area. These symptoms may represent a serious problem that is an emergency. Do not wait to see if Tony symptoms will go away. Get medical help right away. Call your local emergency services (911 in [place] Do not drive yourself to Tony hospital. Summary After Tony procedure, it is normal to have bruising and tenderness at Tony puncture site in your groin, neck, or forearm. Check your puncture site every day for signs of infection. Get help right away if your puncture site is bleeding and Tony bleeding does not stop after applying firm pressure to Tony area. This is a medical emergency.  This information is not intended to replace advice given to you by your health care provider. Make sure you discuss any questions you have with your health care provider.   Discharge instructions   Complete by: As directed    [facility] Procedure, Care After  Procedure MD: Dr. Armandina Stammer Clinical Coordinator: Lenice Llamas, RN  This sheet gives you information about how to care for yourself after your procedure. Your health care provider may also give you more specific instructions. If you have problems or questions, contact your health care provider.  What can I expect after Tony procedure? After Tony procedure, it is common to have: Bruising around your puncture site. Tenderness around your puncture site. Tiredness (fatigue).  Medication instructions It is very important to continue to take your blood thinner as directed by your doctor after Tony Watchman  procedure. Call your procedure doctor's office with question or concerns. If you are on Coumadin (warfarin), you will have your INR checked Tony week after your procedure, with a goal INR of 2.0 - 3.0. Please follow your medication instructions on your discharge summary. Only take Tony medications listed on your discharge paperwork.  Follow up You will be seen in 1 month after your procedure You will have a repeat CT scan approximately 8 weeks after your procedure mark to check your device You will follow up the MD/APP who performed your procedure 6 months after your procedure Tony Watchman Clinical Coordinator will check in with you from time to time, including 1 and 2 years after your procedure.    Follow these instructions at home: Puncture site care  Follow instructions from your health care provider about how to take care of your puncture site. Make sure you: If present, leave stitches (sutures), skin glue, or adhesive strips in place.  If a large square bandage is present, this may be removed 24 hours after surgery.  Check your puncture site every day for signs of infection. Check  for: Redness, swelling, or pain. Fluid or blood. If your puncture site starts to bleed, lie down on your back, apply firm pressure to Tony area, and contact your health care provider. Warmth. Pus or a bad smell. Driving Do not drive yourself home if you received sedation Do not drive for at least 4 days after your procedure or however long your health care provider recommends. (Do not resume driving if you have previously been instructed not to drive for other health reasons.) Do not spend greater than 1 hour at a time in a car for Tony first 3 days. Stop and take a break with a 5 minute walk at least every hour.  Do not drive or use heavy machinery while taking prescription pain medicine.  Activity Avoid activities that take a lot of effort, including exercise, for at least 7 days after your procedure. For  Tony first 3 days, avoid sitting for longer than one hour at a time.  Avoid alcoholic beverages, signing paperwork, or participating in legal proceedings for 24 hours after receiving sedation Do not lift anything that is heavier than 10 lb (4.5 kg) for one week.  No sexual activity for 1 week.  Return to your normal activities as told by your health care provider. Ask your health care provider what activities are safe for you. General instructions Take over-Tony-counter and prescription medicines only as told by your health care provider. Do not use any products that contain nicotine or tobacco, such as cigarettes and e-cigarettes. If you need help quitting, ask your health care provider. You may shower after 24 hours, but Do not take baths, swim, or use a hot tub for 1 week.  Do not drink alcohol for 24 hours after your procedure. Keep all follow-up visits as told by your health care provider. This is important. Dental Work: You will require antibiotics prior to any dental work, including cleanings, for 6 months after your Watchman implantation to help protect you from infection. After 6 months, antibiotics are no longer required. Contact a health care provider if: You have redness, mild swelling, or pain around your puncture site. You have soreness in your throat or at your puncture site that does not improve after several days You have fluid or blood coming from your puncture site that stops after applying firm pressure to Tony area. Your puncture site feels warm to Tony touch. You have pus or a bad smell coming from your puncture site. You have a fever. You have chest pain or discomfort that spreads to your neck, jaw, or arm. You are sweating a lot. You feel nauseous. You have a fast or irregular heartbeat. You have shortness of breath. You are dizzy or light-headed and feel Tony need to lie down. You have pain or numbness in Tony arm or leg closest to your puncture site. Get help right away  if: Your puncture site suddenly swells. Your puncture site is bleeding and Tony bleeding does not stop after applying firm pressure to Tony area. These symptoms may represent a serious problem that is an emergency. Do not wait to see if Tony symptoms will go away. Get medical help right away. Call your local emergency services (911 in [place] Do not drive yourself to Tony hospital. Summary After Tony procedure, it is normal to have bruising and tenderness at Tony puncture site in your groin, neck, or forearm. Check your puncture site every day for signs of infection. Get help right away if your puncture site is bleeding  and Tony bleeding does not stop after applying firm pressure to Tony area. This is a medical emergency.  This information is not intended to replace advice given to you by your health care provider. Make sure you discuss any questions you have with your health care provider.   Increase activity slowly   Complete by: As directed    Increase activity slowly   Complete by: As directed        Follow-up Information     Tony Raymond, NP Follow up on 01/20/2022.   Specialty: Cardiology Why: @ 9:45am. Please arrive at 9:30am. Contact information: [address] [place] [address] [phone]                Duration of Discharge Encounter: Greater than 30 minutes including physician time.  Signed, Kathyrn Drown, NP  12/18/2021 9:36 AM   Foster seen, examined. Available data reviewed. Agree with findings, assessment, and plan as outlined by Kathyrn Drown, NP.  [name] is independently interviewed and examined.  He is alert, oriented, in no distress.  Lungs have inspiratory crackles bilaterally, heart is regular rate and rhythm with a 3/6 harsh crescendo decrescendo murmur at Tony right upper sternal border, abdomen is soft and nontender, right groin site is clear with no hematoma or ecchymosis, lower extremities have 2+ edema.  [name] had an  uncomplicated Watchman procedure yesterday, but was kept overnight because of hypoxemia in Tony setting of known but newly diagnosed pulmonary fibrosis.  I spoke with Dr. Vaughan Browner this morning.  We will have him evaluated for home O2.  He was diuresed overnight as well.  His chest x-ray shows chronic interstitial changes and mild pulmonary vascular congestion.  He is started back on his home furosemide dose of 80 mg twice daily.  [name] is otherwise clinically stable for discharge with plans as outlined above.  He has moderately severe aortic stenosis and is followed closely by Dr. Stanford Breed in Tony outpatient setting.  [name], M.D. 12/18/2021 9:41 AM

## 2021-12-17 NOTE — Interval H&P Note (Signed)
History and Physical Interval Note:  12/17/2021 7:34 AM  [name]  has presented today for surgery, with the diagnosis of PAF.  The various methods of treatment have been discussed with the patient and family. After consideration of risks, benefits and other options for treatment, the patient has consented to  Procedure(s): LEFT ATRIAL APPENDAGE OCCLUSION (N/A) TRANSESOPHAGEAL ECHOCARDIOGRAM (TEE) (N/A) as a surgical intervention.  The patient's history has been reviewed, patient examined, no change in status, stable for surgery.  I have reviewed the patient's chart and labs.  Questions were answered to the patient's satisfaction.     [name]

## 2021-12-17 NOTE — Anesthesia Procedure Notes (Signed)
Procedure Name: Intubation Date/Time: 12/17/2021 10:08 AM  Performed by: Janene Harvey, CRNAPre-anesthesia Checklist: Patient identified, Emergency Drugs available, Suction available and Patient being monitored Patient Re-evaluated:Patient Re-evaluated prior to induction Oxygen Delivery Method: Circle system utilized Preoxygenation: Pre-oxygenation with 100% oxygen Induction Type: IV induction Ventilation: Mask ventilation without difficulty and Oral airway inserted - appropriate to patient size Laryngoscope Size: Mac and 4 Grade View: Grade I Tube type: Oral Tube size: 7.5 mm Number of attempts: 1 Airway Equipment and Method: Stylet and Oral airway Placement Confirmation: ETT inserted through vocal cords under direct vision, positive ETCO2 and breath sounds checked- equal and bilateral Secured at: 22 cm Tube secured with: Tape Dental Injury: Teeth and Oropharynx as per pre-operative assessment

## 2021-12-17 NOTE — Progress Notes (Addendum)
    [place] TEAM   Patient doing well s/p LAAO closure. He is hemodynamically stable. Groin site is stable. Plan for early ambulation after bedrest completed and hopeful discharge over the next several hours.    Ellsworth Lennox RN, Nurse Practitioner Student  Structural Heart Team  Pager: [phone] Phone: [phone]

## 2021-12-17 NOTE — Anesthesia Postprocedure Evaluation (Signed)
Anesthesia Post Note  Patient: [name]  Procedure(s) Performed: LEFT ATRIAL APPENDAGE OCCLUSION TRANSESOPHAGEAL ECHOCARDIOGRAM (TEE)     Patient location during evaluation: PACU Anesthesia Type: General Level of consciousness: awake and alert Pain management: pain level controlled Vital Signs Assessment: post-procedure vital signs reviewed and stable Respiratory status: spontaneous breathing, nonlabored ventilation, respiratory function stable and patient connected to nasal cannula oxygen Cardiovascular status: blood pressure returned to baseline and stable Postop Assessment: no apparent nausea or vomiting Anesthetic complications: no  There were no known notable events for this encounter.  Last Vitals:  Vitals:   12/17/21 1418 12/17/21 1430  BP: (!) 104/46 (!) 105/55  Pulse: 64 68  Resp: 20 18  Temp: 37 C   SpO2: 92% 91%    Last Pain:  Vitals:   12/17/21 1418  TempSrc: Oral  PainSc: 0-No pain                 Tiajuana Amass

## 2021-12-17 NOTE — Progress Notes (Signed)
Pt arrived to unit from cath lab   A/O x 4,  CCMD called ,CHG given, pt oriented to unit,Will continue to monitor. Right groin level 0   Phoebe Sharps, RN    12/17/21 1418  Vitals  Temp 98.6 F (37 C)  Temp Source Oral  BP (!) 104/46  MAP (mmHg) (!) 64  BP Location Left Arm  BP Method Automatic  Patient Position (if appropriate) Lying  Pulse Rate 64  Pulse Rate Source Monitor  ECG Heart Rate 66  Resp 20  Level of Consciousness  Level of Consciousness Alert  Oxygen Therapy  SpO2 92 %  O2 Device Nasal Cannula  O2 Flow Rate (L/min) 2 L/min  Pain Assessment  Pain Scale 0-10  Pain Score 0  MEWS Score  MEWS Temp 0  MEWS Systolic 0  MEWS Pulse 0  MEWS RR 0  MEWS LOC 0  MEWS Score 0  MEWS Score Color Nyoka Cowden

## 2021-12-17 NOTE — Transfer of Care (Signed)
Immediate Anesthesia Transfer of Care Note  Patient: [name]  Procedure(s) Performed: LEFT ATRIAL APPENDAGE OCCLUSION TRANSESOPHAGEAL ECHOCARDIOGRAM (TEE)  Patient Location: Cath Lab  Anesthesia Type:General  Level of Consciousness: awake and patient cooperative  Airway & Oxygen Therapy: Patient Spontanous Breathing and Patient connected to nasal cannula oxygen  Post-op Assessment: Report given to RN and Post -op Vital signs reviewed and stable  Post vital signs: Reviewed and stable  Last Vitals:  Vitals Value Taken Time  BP 104/40 12/17/21 1216  Temp 37 C 12/17/21 1213  Pulse 60 12/17/21 1216  Resp 22 12/17/21 1216  SpO2 91 % 12/17/21 1216  Vitals shown include unvalidated device data.  Last Pain:  Vitals:   12/17/21 1213  TempSrc: Temporal  PainSc: 0-No pain         Complications: There were no known notable events for this encounter.

## 2021-12-17 NOTE — Anesthesia Preprocedure Evaluation (Signed)
Anesthesia Evaluation  Patient identified by MRN, date of birth, ID band Patient awake    Reviewed: Allergy & Precautions, NPO status , Patient's Chart, lab work & pertinent test results  Airway Mallampati: II  TM Distance: >3 FB Neck ROM: Full    Dental  (+) Dental Advisory Given   Pulmonary sleep apnea    breath sounds clear to auscultation       Cardiovascular hypertension, Pt. on medications and Pt. on home beta blockers +CHF  + dysrhythmias Atrial Fibrillation  Rhythm:Regular Rate:Normal     Neuro/Psych negative neurological ROS     GI/Hepatic negative GI ROS, Neg liver ROS,,,  Endo/Other  negative endocrine ROS    Renal/GU CRFRenal disease     Musculoskeletal   Abdominal   Peds  Hematology negative hematology ROS (+)   Anesthesia Other Findings   Reproductive/Obstetrics                              Lab Results  Component Value Date   WBC 4.4 11/30/2021   HGB 13.0 11/30/2021   HCT 38.4 11/30/2021   MCV 94 11/30/2021   PLT 88 (LL) 11/30/2021   Lab Results  Component Value Date   CREATININE 0.81 11/30/2021   BUN 14 11/30/2021   NA 139 11/30/2021   K 4.3 11/30/2021   CL 106 11/30/2021   CO2 21 11/30/2021    Anesthesia Physical Anesthesia Plan  ASA: 3  Anesthesia Plan: General   Post-op Pain Management: Tylenol PO (pre-op)* and Minimal or no pain anticipated   Induction: Intravenous  PONV Risk Score and Plan: 2 and Dexamethasone, Ondansetron and Treatment may vary due to age or medical condition  Airway Management Planned: Oral ETT  Additional Equipment: ClearSight  Intra-op Plan:   Post-operative Plan: Extubation in OR  Informed Consent: I have reviewed the patients History and Physical, chart, labs and discussed the procedure including the risks, benefits and alternatives for the proposed anesthesia with the patient or authorized representative who has  indicated his/her understanding and acceptance.     Dental advisory given  Plan Discussed with: CRNA  Anesthesia Plan Comments:          Anesthesia Quick Evaluation

## 2021-12-18 ENCOUNTER — Encounter (HOSPITAL_COMMUNITY): Payer: Self-pay | Admitting: Cardiovascular Disease

## 2021-12-18 ENCOUNTER — Inpatient Hospital Stay (HOSPITAL_COMMUNITY): Payer: 59

## 2021-12-18 DIAGNOSIS — I480 Paroxysmal atrial fibrillation: Secondary | ICD-10-CM

## 2021-12-18 DIAGNOSIS — J8410 Pulmonary fibrosis, unspecified: Secondary | ICD-10-CM | POA: Insufficient documentation

## 2021-12-18 DIAGNOSIS — R0902 Hypoxemia: Secondary | ICD-10-CM | POA: Insufficient documentation

## 2021-12-18 LAB — BASIC METABOLIC PANEL
Anion gap: 8 (ref 5–15)
BUN: 16 mg/dL (ref 8–23)
CO2: 24 mmol/L (ref 22–32)
Calcium: 8.2 mg/dL — ABNORMAL LOW (ref 8.9–10.3)
Chloride: 107 mmol/L (ref 98–111)
Creatinine, Ser: 1.08 mg/dL (ref 0.61–1.24)
GFR, Estimated: >60 mL/min (ref >60)
Glucose, Bld: 139 mg/dL — ABNORMAL HIGH (ref 70–99)
Potassium: 3.8 mmol/L (ref 3.5–5.1)
Sodium: 139 mmol/L (ref 135–145)

## 2021-12-18 NOTE — Progress Notes (Signed)
SATURATION QUALIFICATIONS: (This note is used to comply with regulatory documentation for home oxygen)  Patient Saturations on Room Air at Rest = 88%    Patient Saturations on 2 Liters of oxygen while at rest = 91%  Please briefly explain why patient needs home oxygen: pt's oxygen saturation drops to 88% while on room air at rest.

## 2021-12-18 NOTE — TOC Transition Note (Signed)
Transition of Care (TOC) - CM/SW Discharge Note Marvetta Gibbons RN, BSN Transitions of Care Unit 4E- RN Case Manager See Treatment Team for direct phone #   Patient Details  Name: [name] MRN: [id number] Date of Birth: Feb 17, 1950  [facility]) CM/SW Contact:  Dawayne Patricia, RN Phone Number: 12/18/2021, 11:14 AM   Clinical Narrative:    Pt stable for transition home today s/p Watchman device.  Order for home 02 placed.  CM in to speak with pt about home 02 needs.  Per conversation pt reports that he has a home CPAP that he has had several years. He believes he got it through [facility]. Discussed [facility] for Home DME company for home 02- pt voiced that he wants to have a portable tank that is as small and easier to carry as possible (asked about one that is seen on TV) Explained to pt the one that is seen on TV- insurance typically does not cover and it is not a continuous delivery device- discussed options for portable concentrator. Pt voiced he does not have a preference for agency and will defer to this writer to secure one that offers a portable concentrator for ease of mobility.   Address, phone # and PCP (Dr Jerilee Hoh) confirmed in [place]. Pt prefers his mobile # to be primary contact.   Call made to [facility] with [employer] for home 02 needs- [employer] will provide portable concentrator for transport home and set up home unit post discharge.   No further TOC needs noted.        Final next level of care: Home/Self Care Barriers to Discharge: No Barriers Identified   Patient Goals and CMS Choice Patient states their goals for this hospitalization and ongoing recovery are:: to return home CMS Medicare.gov Compare Post Acute Care list provided to:: Patient Choice offered to / list presented to : Patient  Discharge Placement               Home         Discharge Plan and Services   Discharge Planning Services: CM Consult Post Acute Care  Choice: Durable Medical Equipment          DME Arranged: Oxygen DME Agency: [employer] Date DME Agency Contacted: 12/18/21 Time DME Agency Contacted: 1100 Representative spoke with at DME Agency: Brenton Grills HH Arranged: NA [place] Agency: NA        Social Determinants of Health ([place]) Interventions     Readmission Risk Interventions    12/18/2021   11:14 AM  Readmission Risk Prevention Plan  Post Dischage Appt Complete  Medication Screening Complete  Transportation Screening Complete

## 2021-12-18 NOTE — Progress Notes (Signed)
Nursing dc note  Patient alert and oriented , both patient and wife verbalized understanding of dc instructions. All belongings given to patient. Iv site unremarkable. Ccmd notified of dc order.

## 2021-12-21 ENCOUNTER — Telehealth: Payer: Self-pay | Admitting: Cardiology

## 2021-12-21 NOTE — Telephone Encounter (Signed)
  [place] TEAM   Patient contacted regarding discharge from [facility] on 12/18/21.   Patient understands to follow up with provider Kathyrn Drown NP on 01/20/22 at the [place] office.  Patient understands discharge instructions? Yes  Patient understands medications and regimen? Yes  Patient understands to bring all medications to this visit? Yes   Kathyrn Drown NP-C Structural Heart Team  Pager: [phone]

## 2021-12-24 ENCOUNTER — Telehealth: Payer: Self-pay | Admitting: Physician Assistant

## 2021-12-24 NOTE — Telephone Encounter (Signed)
   The patient called the answering service after-hours today (Thanksgiving hours). He states he got a call from [facility] but they hung up before he could answer. He hit redial and got our office number. They did not leave a VM. He spoke to [place] in [facility] call 12/21/21 but I do not see any other notes from our cardiology team reaching out to him for any reason. He otherwise has no concerns or complaints today. I told him I would send this to his Watchman team in case one of their team members was trying to call him. Otherwise I told him to try and answer if they call again to see who it was. I wonder if perhaps it was a robo call for upcoming OV scheduled 12/29/21. He verbalized understanding and gratitude.  Charlie Pitter, PA-C

## 2021-12-27 ENCOUNTER — Other Ambulatory Visit: Payer: Self-pay | Admitting: Cardiology

## 2021-12-28 NOTE — Telephone Encounter (Signed)
[name]. Patient states he paid $400 for pirfenidone when it was $100 through insurance. I went back and forth on 12/04/21 x 3 hours between copay card company and specialty pharmacy regarding patient's copay.  Copay is $75 through just insurance today oer rep.  I once again explained situation in detail. Rep states she will place IT request and requesting re-billing of claim through insurance. Asked rep to remove copay card from file as it is not helping with the copay at all. She states she does not know turnaround time.  Will f/u next week regarding this to hopefully reach resolution  Knox Saliva, PharmD, MPH, BCPS, CPP Clinical Pharmacist (Rheumatology and Pulmonology)

## 2021-12-29 ENCOUNTER — Ambulatory Visit: Payer: 59 | Attending: Cardiology | Admitting: Cardiology

## 2021-12-29 ENCOUNTER — Encounter: Payer: Self-pay | Admitting: Cardiology

## 2021-12-29 VITALS — BP 138/68 | HR 70 | Ht 66.0 in | Wt 210.6 lb

## 2021-12-29 DIAGNOSIS — I480 Paroxysmal atrial fibrillation: Secondary | ICD-10-CM

## 2021-12-29 DIAGNOSIS — Z95818 Presence of other cardiac implants and grafts: Secondary | ICD-10-CM

## 2021-12-29 DIAGNOSIS — I350 Nonrheumatic aortic (valve) stenosis: Secondary | ICD-10-CM | POA: Diagnosis not present

## 2021-12-29 DIAGNOSIS — R911 Solitary pulmonary nodule: Secondary | ICD-10-CM | POA: Diagnosis not present

## 2021-12-29 DIAGNOSIS — I5042 Chronic combined systolic (congestive) and diastolic (congestive) heart failure: Secondary | ICD-10-CM

## 2021-12-29 NOTE — Patient Instructions (Signed)
  Testing/Procedures:  Your physician has requested that you have an echocardiogram. Echocardiography is a painless test that uses sound waves to create images of your heart. It provides your doctor with information about the size and shape of your heart and how well your heart's chambers and valves are working. This procedure takes approximately one hour. There are no restrictions for this procedure. Please do NOT wear cologne, perfume, aftershave, or lotions (deodorant is allowed). Please arrive 15 minutes prior to your appointment time. [place], you and your health needs are our priority.  As part of our continuing mission to provide you with exceptional heart care, we have created designated Provider Care Teams.  These Care Teams include your primary Cardiologist (physician) and Advanced Practice Providers (APPs -  Physician Assistants and Nurse Practitioners) who all work together to provide you with the care you need, when you need it.  We recommend signing up for the patient portal called "MyChart".  Sign up information is provided on this After Visit Summary.  MyChart is used to connect with patients for Virtual Visits (Telemedicine).  Patients are able to view lab/test results, encounter notes, upcoming appointments, etc.  Non-urgent messages can be sent to your provider as well.   To learn more about what you can do with MyChart, go to [email or web address].    Your next appointment:   3 month(s)  The format for your next appointment:   In Person  Provider:   Kirk Ruths, MD

## 2022-01-01 ENCOUNTER — Telehealth: Payer: Self-pay

## 2022-01-01 ENCOUNTER — Other Ambulatory Visit: Payer: Self-pay

## 2022-01-01 DIAGNOSIS — K7460 Unspecified cirrhosis of liver: Secondary | ICD-10-CM

## 2022-01-01 NOTE — Telephone Encounter (Signed)
-----   Message from Lindon Romp, [place] sent at 09/24/2021  2:47 PM EDT ----- Patient of Dr. Havery Moros needs RUQ ultrasound and AFP tumor marker for cirrhosis. Schedule and call patient.

## 2022-01-01 NOTE — Telephone Encounter (Signed)
Lab orders and ultrasound put in Epic. [place] scheduling will contact patient to schedule RUQ ultrasound. Called patient to have him come by our lab and patient did not answer and was unable to leave a message.

## 2022-01-01 NOTE — Telephone Encounter (Signed)
Spoke with patient and informed him to come by our lab in the basement for repeat labs and [facility] scheduling will contact him directly to schedule RUQ ultrasound. Patient verbalized understanding.

## 2022-01-07 ENCOUNTER — Other Ambulatory Visit: Payer: 59

## 2022-01-07 DIAGNOSIS — K7460 Unspecified cirrhosis of liver: Secondary | ICD-10-CM

## 2022-01-08 LAB — AFP TUMOR MARKER: AFP-Tumor Marker: 2.4 ng/mL (ref <6.1)

## 2022-01-14 ENCOUNTER — Ambulatory Visit (HOSPITAL_BASED_OUTPATIENT_CLINIC_OR_DEPARTMENT_OTHER)
Admission: RE | Admit: 2022-01-14 | Discharge: 2022-01-14 | Disposition: A | Discharge status: Home / Self Care | Payer: 59 | Source: Ambulatory Visit | Attending: Gastroenterology | Admitting: Gastroenterology

## 2022-01-14 DIAGNOSIS — K7460 Unspecified cirrhosis of liver: Secondary | ICD-10-CM | POA: Insufficient documentation

## 2022-01-18 ENCOUNTER — Other Ambulatory Visit: Payer: Self-pay

## 2022-01-18 ENCOUNTER — Ambulatory Visit: Payer: 59 | Admitting: Cardiovascular Disease

## 2022-01-18 DIAGNOSIS — I8500 Esophageal varices without bleeding: Secondary | ICD-10-CM

## 2022-01-18 DIAGNOSIS — K7460 Unspecified cirrhosis of liver: Secondary | ICD-10-CM

## 2022-01-18 NOTE — Telephone Encounter (Signed)
Patient called and was advised that Dr. Havery Moros would like for him to go to the lab for LFTs. He expressed understanding

## 2022-01-19 ENCOUNTER — Other Ambulatory Visit (INDEPENDENT_AMBULATORY_CARE_PROVIDER_SITE_OTHER): Payer: 59

## 2022-01-19 ENCOUNTER — Ambulatory Visit (HOSPITAL_COMMUNITY)
Admission: RE | Admit: 2022-01-19 | Discharge: 2022-01-19 | Disposition: A | Discharge status: Home / Self Care | Payer: 59 | Source: Ambulatory Visit | Attending: Cardiology | Admitting: Cardiology

## 2022-01-19 DIAGNOSIS — I350 Nonrheumatic aortic (valve) stenosis: Secondary | ICD-10-CM | POA: Diagnosis present

## 2022-01-19 DIAGNOSIS — K7460 Unspecified cirrhosis of liver: Secondary | ICD-10-CM | POA: Diagnosis not present

## 2022-01-19 DIAGNOSIS — I8500 Esophageal varices without bleeding: Secondary | ICD-10-CM

## 2022-01-19 LAB — ECHOCARDIOGRAM COMPLETE
AR max vel: 1.33 cm2
AV Area VTI: 1.39 cm2
AV Area mean vel: 1.37 cm2
AV Mean grad: 34 mmHg
AV Peak grad: 52.4 mmHg
Ao pk vel: 3.62 m/s
Area-P 1/2: 3.17 cm2
Calc EF: 55 %
MV VTI: 2.92 cm2
S' Lateral: 3.9 cm
Single Plane A2C EF: 48.1 %
Single Plane A4C EF: 59 %

## 2022-01-19 LAB — HEPATIC FUNCTION PANEL
ALT: 16 U/L (ref 0–53)
AST: 27 U/L (ref 0–37)
Albumin: 3.3 g/dL — ABNORMAL LOW (ref 3.5–5.2)
Alkaline Phosphatase: 79 U/L (ref 39–117)
Bilirubin, Direct: 0.7 mg/dL — ABNORMAL HIGH (ref 0.0–0.3)
Total Bilirubin: 2.8 mg/dL — ABNORMAL HIGH (ref 0.2–1.2)
Total Protein: 7.1 g/dL (ref 6.0–8.3)

## 2022-01-19 NOTE — Progress Notes (Unsigned)
[facility]                                     Cardiology Office Note:    Date:  01/20/2022   ID:  [name], DOB 04-27-1950, MRN [id number]  PCP:  Tony Raymond, NP  [facility] Cardiologist:  Tony Ruths, MD / Dr. Burt Knack, MD ([place]) [facility] Electrophysiologist:  None   Referring MD: Tony Raymond, NP   Chief Complaint  Patient presents with   Follow-up    1 month s/p LAAO    History of Present Illness:    [name] is a 71 y.o. 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 Burt Foster for [place] evaluation.   [name] has a long hx of cirrhosis complicated by thrombocytopenia and is followed closely by GI. Additionally he is followed by pulmonology due to interstitial lung disease. He has undergone high-resolution CT scanning and PFTs with Dr. Vaughan Foster. He has been followed for quite some time by Dr. Stanford Foster for his cardiology care. He has been monitored with surveillance echocardiogram imaging for progressive aortic stenosis. In [facility] consult, Dr. Burt Foster briefly spoke to him about his moderate AS and possible TAVR in the future.    He is now s/p left atrial appendage occlusive device placement with 67m Watchman device on 12/17/21. He tolerated the procedure well. He did have issues with post procedure hypoxia and [facility]. He was restarted on Eliquis x 6 weeks (12/31), then will stop Eliquis and start ASA 81 mg and Plavix 75 mg daily through 6 months (06/17/22). He is scheduled for a repeat CT scan 02/24/22 however will change this to pre TAVR scans in anticipation of TAVR in the future.  Today he is here alone and reports that he has done weill  from a Watchman standpoint without bleeding, dizziness, or syncope. He does report worsening dyspnea with ambulation which he feels has worsened over the last one or so. Prior echo from  07/2021 showed normal LV function, severe left ventricular enlargement, mild left ventricular hypertrophy, grade 2 diastolic dysfunction, mild right ventricular enlargement, moderate biatrial enlargement, mild mitral regurgitation, moderate aortic stenosis with mean gradient 32 mmHg, aortic valve area 1.4 cm and dimensionless index 0.34. Aortic valve calcium score noted to be 2791 suggesting more severe disease. He was seen by Dr. CStanford Foster plans to repeat an echo with low threshold to proceed with TAVR workup. Echo showed normal LV function, AVA by VTI at 1.39cm2, mean gradient at 349mg, peak 5211m, and DI at 0.37. Not likely a Progress trail patient given extensive lung disease.    Past Medical History:  Diagnosis Date   Adjustment disorder with depressed mood 09/07/2007   Cataract    removed bilaterally   Chronic kidney disease    kidney stones   [place]  Colon polyps    Tubular Adenoma 2010   CONGESTIVE HEART FAILURE 12/09/2008   CORONARY ARTERY DISEASE 2006   HYPERLIPIDEMIA 08/09/2006   HYPERTENSION 08/09/2006   MYOCARDIAL INFARCTION, HX OF 07/07/2004   NEPHROLITHIASIS, HX OF 08/09/2006   OBESITY 07/19/2008   Presence of Watchman left  atrial appendage closure device 12/17/2021   51m Watchman with Dr. CBurt Foster  Sleep apnea    has a cpap - not using religiously    Past Surgical History:  Procedure Laterality Date   BIOPSY  11/18/2020   Procedure: BIOPSY;  Surgeon: AYetta Flock MD;  [facility];  Service: Gastroenterology;;   CARDIOVERSION N/A 12/14/2017   Procedure: CARDIOVERSION;  Surgeon: RSkeet Latch MD;  [facility];  Service: Cardiovascular;  Laterality: N/A;   CARDIOVERSION N/A 10/01/2019   Procedure: CARDIOVERSION;  Surgeon: CBuford Dresser MD;  [facility];  Service: Cardiovascular;  Laterality: N/A;   COLONOSCOPY     COLONOSCOPY WITH PROPOFOL N/A 11/18/2020   Procedure: COLONOSCOPY WITH PROPOFOL;  Surgeon: AYetta Flock MD;  [facility];  Service: Gastroenterology;  Laterality: N/A;   CORONARY ARTERY BYPASS GRAFT  2006   ESOPHAGOGASTRODUODENOSCOPY (EGD) WITH PROPOFOL N/A 11/18/2020   Procedure: ESOPHAGOGASTRODUODENOSCOPY (EGD) WITH PROPOFOL;  Surgeon: AYetta Flock MD;  [facility];  Service: Gastroenterology;  Laterality: N/A;   LEFT ATRIAL APPENDAGE OCCLUSION N/A 12/17/2021   Procedure: LEFT ATRIAL APPENDAGE OCCLUSION;  Surgeon: CSherren Mocha MD;  [facility];  Service: Cardiovascular;  Laterality: N/A;   POLYPECTOMY     POLYPECTOMY  11/18/2020   Procedure: POLYPECTOMY;  Surgeon: AYetta Flock MD;  [facility];  Service: Gastroenterology;;   TEE WITHOUT CARDIOVERSION N/A 12/17/2021   Procedure: TRANSESOPHAGEAL ECHOCARDIOGRAM (TEE);  Surgeon: CSherren Mocha MD;  [facility];  Service: Cardiovascular;  Laterality: N/A;    Current Medications: Current Meds  Medication Sig   acetaminophen (TYLENOL) 500 MG tablet Take 1,000 mg by mouth daily as needed for moderate pain or headache.   aspirin EC 81 MG tablet Take 81 mg by mouth daily. START 02-01-2022   atorvastatin (LIPITOR) 80 MG tablet TAKE 1 TABLET BY MOUTH EVERY DAY   carvedilol (COREG) 3.125 MG tablet TAKE 1 TABLET BY MOUTH TWICE A DAY WITH FOOD   clopidogrel (PLAVIX) 75 MG tablet Take 1 tablet (75 mg total) by mouth daily. START 02-01-2022   [employer] VITAMIN B12 1000 MCG tablet TAKE 1 TABLET BY MOUTH EVERY DAY   ELIQUIS 5 MG TABS tablet TAKE 1 TABLET BY MOUTH TWICE A DAY (Patient taking differently: Take 5 mg by mouth 2 (two) times daily. LAST DOSE 01-31-22)   empagliflozin (JARDIANCE) 10 MG TABS tablet Take 1 tablet (10 mg total) by mouth daily before breakfast.    furosemide (LASIX) 40 MG tablet Take 2 tablets (80 mg total) by mouth 2 (two) times daily.   KLOR-CON M20 20 MEQ tablet TAKE 1 TABLET BY MOUTH EVERY DAY   Misc Natural Products (LIVER PROTECT PO) Take 1 capsule by mouth daily. From [employer]   Pirfenidone (ESBRIET) 267 MG TABS Take 3 tablets (801 mg total) by mouth 3 (three) times daily with meals. Month 2 and onwards (maintenance)   tamsulosin (FLOMAX) 0.4 MG CAPS capsule TAKE ONE CAPSULE BY MOUTH EVERY DAY AFTER SUPPER     Allergies:   Patient has no known allergies.   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 Tobacco Use   Smoking status: Never   Smokeless tobacco: Never  Vaping Use   Vaping Use: Never used  Substance and Sexual Activity   Alcohol use: Yes    Alcohol/week: 1.0 standard drink of alcohol    Types: 1 Cans of beer per week    Comment: occasional   Drug use: No   Sexual activity: Not on file  Other Topics Concern   Not on file  Social History Narrative   Not on file   Social Determinants of Health   Financial Resource Strain: Not on file  Food Insecurity: No Food Insecurity (12/17/2021)   Hunger Vital Sign    Worried About Running Out of Food in the Last Year: Never true    Ran Out of Food in the Last Year: Never true  Transportation Needs: No Transportation Needs (12/17/2021)   PRAPARE - Hydrologist (Medical): No    Lack of Transportation (Non-Medical): No  Physical Activity: Not on file  Stress: Not on file  Social Connections: Not on file     Family History: The patient's family history includes Bipolar disorder in his brother; Heart disease in his father; Hyperlipidemia in his mother; Hypertension in his mother; Ulcers in his father. There is  no history of Colon cancer, Colon polyps, Esophageal cancer, Rectal cancer, or Stomach cancer.  ROS:   Please see the history of present illness.    All other systems reviewed and are negative.  EKGs/Labs/Other Studies Reviewed:    The following studies were reviewed today:  Primary Discharge Diagnosis:  Paroxysmal Atrial Fibrillation Poor candidacy for long term anticoagulation due to  thrombocytopenia   Secondary Discharge Diagnosis:  -Nonalcoholic cirrhosis with splenomegaly -Portal venous hypertension  -Acute on chronic diastolic HF -Pulmonary fibrosis    Procedures This Admission:  Transeptal Puncture Intra-procedural TEE which showed no LAA thrombus Left atrial appendage occlusive device placement on 12/17/21 by Dr. Burt Foster.    Successful left atrial appendage occlusion with a 27 mm watchman flex device under fluoroscopic and TEE guidance   Recommend: Apixaban 5 mg BID x 6 weeks, then ASA 81 mg and plavix 75 mg daily through 6 months Cardiac CTA 8 weeks for post-implant surveillance   Echocardiogram 01/19/22:  Left ventricular ejection fraction, by estimation, is 55 to 60%. The left ventricle has normal function. The left ventricle demonstrates regional wall motion abnormalities (see scoring diagram/findings for description). The left ventricular internal cavity size was moderately dilated. Left ventricular diastolic function could not be evaluated. There is hypokinesis of the left ventricular, apical apical segment. 1. Right ventricular systolic function is normal. The right ventricular size is mildly enlarged. There is normal pulmonary artery systolic pressure. 2. 3. Left atrial size was moderately dilated. 4. Right atrial size was moderately dilated. The mitral valve is grossly normal. Trivial mitral valve regurgitation. No evidence of mitral stenosis. 5. The aortic valve is calcified. There is moderate calcification of the aortic valve. There is moderate  thickening of the aortic valve. Aortic valve regurgitation is not visualized. Moderate aortic valve stenosis. Aortic valve area, by VTI measures 1.39 cm. Aortic valve mean gradient measures 34.0 mmHg. Aortic valve Vmax measures 3.62 m/s. 6. The inferior vena cava is dilated in size  Pre LAAO CT 11/13/21:  IMPRESSION: 1. The left atrial appendage is a small chicken wing.   2. A 27 watchman FLX device is recommended based on the above landing zone measurements (21.7 mm maximum diameter; 22% compression).   3. There is no thrombus in the left atrial appendage.   4.  An mid-posterior IAS puncture site is recommended.   5. Optimal deployment angle: RAO CAU 10   6. Normal coronary origin. Right dominance. Prior CABG. Patent LIMA to LAD, SVG to diagonal. The SVG to acute marginal and SVG to PDA are not seen, and may be occluded.   7. Severe aortic valve calcification, AV calcium score 2784. Study was not performed to assess for TAVR candidacy.  EKG:  EKG is not ordered today.    Recent Labs: 02/12/2021: TSH 2.51 05/20/2021: B Natriuretic Peptide 1,190.0 12/17/2021: Hemoglobin 13.1; Platelets 76 12/18/2021: BUN 16; Creatinine, Ser 1.08; Potassium 3.8; Sodium 139 01/19/2022: ALT 16   Recent Lipid Panel    Component Value Date/Time   CHOL 118 02/12/2021 1438   CHOL 111 07/23/2019 1144   TRIG 66.0 02/12/2021 1438   HDL 40.30 02/12/2021 1438   HDL 48 07/23/2019 1144   CHOLHDL 3 02/12/2021 1438   VLDL 13.2 02/12/2021 1438   LDLCALC 65 02/12/2021 1438   LDLCALC 53 11/30/2019 0929   LDLDIRECT 198.2 07/11/2009 0925   Physical Exam:    VS:  BP 120/60   Pulse 63   Ht '5\' 6"'$  (1.676 m)   Wt 208 lb (94.3 kg)   SpO2 99%   BMI 33.57 kg/m     Wt Readings from Last 3 Encounters:  01/20/22 208 lb (94.3 kg)  12/29/21 210 lb 9.6 oz (95.5 kg)  12/17/21 209 lb 7 oz (95 kg)    General: Well developed, well nourished, NAD Neck: Negative for carotid bruits. No JVD Lungs:Diminished in  upper and lower lobes with no wheezes, rales, or rhonchi. Breathing is unlabored. Cardiovascular: RRR with S1 S2. + harsh murmur Extremities: No edema.  Neuro: Alert and oriented. No focal deficits. No facial asymmetry. MAE spontaneously. Psych: Responds to questions appropriately with normal affect.    ASSESSMENT/PLAN:    Paroxysmal atrial fibrillation: s/p LAAO with 71m Watchman device on 12/17/21. He was restarted on Eliquis x 6 weeks. He will continue through 12/31, then will stop and start ASA 81 mg and Plavix 75 mg daily (02/01/22) through 6 months (06/17/22). He is scheduled for a repeat CT scan 02/24/22 however will change this to pre TAVR scans in anticipation of TAVR in the future. He will require dental SBE for 6 months. Pre-CT instructions reviewed. Addend: He will see PCP 1/15 and will have CBC, BMET drawn at that time.  Moderate aortic stenosis: Echo from 07/2021 showed normal LV function, severe left ventricular enlargement, mild left ventricular hypertrophy, grade 2 diastolic dysfunction, mild right ventricular enlargement, moderate biatrial enlargement, mild mitral regurgitation, moderate aortic stenosis with mean gradient 32 mmHg, aortic valve area 1.4 cm and dimensionless index 0.34. Aortic valve calcium score noted to be 2791 suggesting more severe disease. Seen by Dr. CStanford Foster progressive DOE with repeat echo showing normal LV function, AVA by VTI at 1.39cm2, mean gradient at 328mg, peak 5283m, and DI at 0.37. Not likely a Progress trail patient given extensive lung disease. Scheduled for post LAAO CT 1/24 and will plan to change this to pre TAVR CT scans and link him back into see Dr. CooBurt Knackr further discussion of TAVR.   Pulmonary fibrosis with hypoxia: Recently seen by pulmonary and dx with ILD and prescribed Esbriet. CXR with chronic interstitial changes and mild pulmonary vascular congestion. Sent home with supplemental O2 2L after LAAO procedure. Not requiring O2 at this  time. Due for sleep study. Follows closely with pulmonary.   Chronic diastolic HF: Appears  euvolemic today on exam. Continue current regimen.   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    Incidental findings: 1.9 x 0.8 cm nodular opacity in the left lower lobe. Recommend PET-CT or follow-up chest CT in 3 months (to be done with 60-day CT). Follows closely with pulmonology for newly dx ILD. Will notify them of findings.    Medication Adjustments/Labs and Tests Ordered: Current medicines are reviewed at length with the patient today.  Concerns regarding medicines are outlined above.  No orders of the defined types were placed in this encounter.  Meds ordered this encounter  Medications   clopidogrel (PLAVIX) 75 MG tablet    Sig: Take 1 tablet (75 mg total) by mouth daily. START 02-01-2022    Dispense:  90 tablet    Refill:  3    Patient Instructions  Medication Instructions:  STOP TAKING :  ELIQUIS 12-31 ( LAST DOSE)   THEN START TAKING : PLAVIX 75 MG AND ASPRIN 81 MG ONCE A DAY  02-01-2022    *If you need a refill on your cardiac medications before your next appointment, please call your pharmacy*   Lab Work: NONE ORDERED  TODAY     If you have labs (blood work) drawn today and your tests are completely normal, you will receive your results only by: [place] (if you have MyChart) OR A paper copy in the mail If you have any lab test that is abnormal or we need to change your treatment, we will call you to review the results.   Test Procedures:  NONE ORDERED  TODAY    Follow-Up: At [facility], you and your health needs are our priority.  As part of our continuing mission to provide you with exceptional heart care, we have created designated Provider Care Teams.  These Care Teams include your primary  Cardiologist (physician) and Advanced Practice Providers (APPs -  Physician Assistants and Nurse Practitioners) who all work together to provide you with the care you need, when you need it.  We recommend signing up for the patient portal called "MyChart".  Sign up information is provided on this After Visit Summary.  MyChart is used to connect with patients for Virtual Visits (Telemedicine).  Patients are able to view lab/test results, encounter notes, upcoming appointments, etc.  Non-urgent messages can be sent to your provider as well.   To learn more about what you can do with MyChart, go to [email or web address].    Your next appointment:  AS SCHEDULED   1 year(s)  The format for your next appointment:   In Person  Provider:    Kathyrn Drown, NP   Other Instructions    Important Information About Sugar         Signed, Kathyrn Drown, NP  01/20/2022 1:48 PM    [facility]

## 2022-01-19 NOTE — H&P (View-Only) (Signed)
[facility]                                     Cardiology Office Note:    Date:  01/20/2022   ID:  [name], DOB 13-May-1950, MRN [id number]  PCP:  Tony Raymond, NP  [facility] Cardiologist:  Tony Ruths, MD / Dr. Burt Knack, MD ([name]) [facility] Electrophysiologist:  None   Referring MD: Tony Raymond, NP   Chief Complaint  Patient presents with   Follow-up    1 month s/p LAAO    History of Present Illness:    [name] is a 71 y.o. 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 Burt Foster for [place] evaluation.   Mr. [name] has a long hx of cirrhosis complicated by thrombocytopenia and is followed closely by GI. Additionally he is followed by pulmonology due to interstitial lung disease. He has undergone high-resolution CT scanning and PFTs with Dr. Vaughan Foster. He has been followed for quite some time by Dr. Stanford Foster for his cardiology care. He has been monitored with surveillance echocardiogram imaging for progressive aortic stenosis. In [facility] consult, Dr. Burt Foster briefly spoke to him about his moderate AS and possible TAVR in the future.    He is now s/p left atrial appendage occlusive device placement with 105m Watchman device on 12/17/21. He tolerated the procedure well. He did have issues with post procedure hypoxia and [facility]. He was restarted on Eliquis x 6 weeks (12/31), then will stop Eliquis and start ASA 81 mg and Plavix 75 mg daily through 6 months (06/17/22). He is scheduled for a repeat CT scan 02/24/22 however will change this to pre TAVR scans in anticipation of TAVR in the future.  Today he is here alone and reports that he has done weill  from a Watchman standpoint without bleeding, dizziness, or syncope. He does report worsening dyspnea with ambulation which he feels has worsened over the last one or so. Prior echo from  07/2021 showed normal LV function, severe left ventricular enlargement, mild left ventricular hypertrophy, grade 2 diastolic dysfunction, mild right ventricular enlargement, moderate biatrial enlargement, mild mitral regurgitation, moderate aortic stenosis with mean gradient 32 mmHg, aortic valve area 1.4 cm and dimensionless index 0.34. Aortic valve calcium score noted to be 2791 suggesting more severe disease. He was seen by Dr. CStanford Breedwith plans to repeat an echo with low threshold to proceed with TAVR workup. Echo showed normal LV function, AVA by VTI at 1.39cm2, mean gradient at 346mg, peak 5235m, and DI at 0.37. Not likely a Progress trail patient given extensive lung disease.    Past Medical History:  Diagnosis Date   Adjustment disorder with depressed mood 09/07/2007   Cataract    removed bilaterally   Chronic kidney disease    kidney stones   [place]  Colon polyps    Tubular Adenoma 2010   CONGESTIVE HEART FAILURE 12/09/2008   CORONARY ARTERY DISEASE 2006   HYPERLIPIDEMIA 08/09/2006   HYPERTENSION 08/09/2006   MYOCARDIAL INFARCTION, HX OF 07/07/2004   NEPHROLITHIASIS, HX OF 08/09/2006   OBESITY 07/19/2008   Presence of Watchman left  atrial appendage closure device 12/17/2021   27m Watchman with Dr. CBurt Foster  Sleep apnea    has a cpap - not using religiously    Past Surgical History:  Procedure Laterality Date   BIOPSY  11/18/2020   Procedure: BIOPSY;  Surgeon: Tony Flock MD;  [facility];  Service: Gastroenterology;;   CARDIOVERSION N/A 12/14/2017   Procedure: CARDIOVERSION;  Surgeon: Tony Latch MD;  [facility];  Service: Cardiovascular;  Laterality: N/A;   CARDIOVERSION N/A 10/01/2019   Procedure: CARDIOVERSION;  Surgeon: CBuford Dresser MD;  [facility];  Service: Cardiovascular;  Laterality: N/A;   COLONOSCOPY     COLONOSCOPY WITH PROPOFOL N/A 11/18/2020   Procedure: COLONOSCOPY WITH PROPOFOL;  Surgeon: Tony Flock MD;  [facility];  Service: Gastroenterology;  Laterality: N/A;   CORONARY ARTERY BYPASS GRAFT  2006   ESOPHAGOGASTRODUODENOSCOPY (EGD) WITH PROPOFOL N/A 11/18/2020   Procedure: ESOPHAGOGASTRODUODENOSCOPY (EGD) WITH PROPOFOL;  Surgeon: Tony Flock MD;  [facility];  Service: Gastroenterology;  Laterality: N/A;   LEFT ATRIAL APPENDAGE OCCLUSION N/A 12/17/2021   Procedure: LEFT ATRIAL APPENDAGE OCCLUSION;  Surgeon: CSherren Mocha MD;  [facility];  Service: Cardiovascular;  Laterality: N/A;   POLYPECTOMY     POLYPECTOMY  11/18/2020   Procedure: POLYPECTOMY;  Surgeon: Tony Flock MD;  [facility];  Service: Gastroenterology;;   TEE WITHOUT CARDIOVERSION N/A 12/17/2021   Procedure: TRANSESOPHAGEAL ECHOCARDIOGRAM (TEE);  Surgeon: CSherren Mocha MD;  [facility];  Service: Cardiovascular;  Laterality: N/A;    Current Medications: Current Meds  Medication Sig   acetaminophen (TYLENOL) 500 MG tablet Take 1,000 mg by mouth daily as needed for moderate pain or headache.   aspirin EC 81 MG tablet Take 81 mg by mouth daily. START 02-01-2022   atorvastatin (LIPITOR) 80 MG tablet TAKE 1 TABLET BY MOUTH EVERY DAY   carvedilol (COREG) 3.125 MG tablet TAKE 1 TABLET BY MOUTH TWICE A DAY WITH FOOD   clopidogrel (PLAVIX) 75 MG tablet Take 1 tablet (75 mg total) by mouth daily. START 02-01-2022   [employer] VITAMIN B12 1000 MCG tablet TAKE 1 TABLET BY MOUTH EVERY DAY   ELIQUIS 5 MG TABS tablet TAKE 1 TABLET BY MOUTH TWICE A DAY (Patient taking differently: Take 5 mg by mouth 2 (two) times daily. LAST DOSE 01-31-22)   empagliflozin (JARDIANCE) 10 MG TABS tablet Take 1 tablet (10 mg total) by mouth daily before breakfast.    furosemide (LASIX) 40 MG tablet Take 2 tablets (80 mg total) by mouth 2 (two) times daily.   KLOR-CON M20 20 MEQ tablet TAKE 1 TABLET BY MOUTH EVERY DAY   Misc Natural Products (LIVER PROTECT PO) Take 1 capsule by mouth daily. From [employer]   Pirfenidone (ESBRIET) 267 MG TABS Take 3 tablets (801 mg total) by mouth 3 (three) times daily with meals. Month 2 and onwards (maintenance)   tamsulosin (FLOMAX) 0.4 MG CAPS capsule TAKE ONE CAPSULE BY MOUTH EVERY DAY AFTER SUPPER     Allergies:   Patient has no known allergies.   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 Tobacco Use   Smoking status: Never   Smokeless tobacco: Never  Vaping Use   Vaping Use: Never used  Substance and Sexual Activity   Alcohol use: Yes    Alcohol/week: 1.0 standard drink of alcohol    Types: 1 Cans of beer per week    Comment: occasional   Drug use: No   Sexual activity: Not on file  Other Topics Concern   Not on file  Social History Narrative   Not on file   Social Determinants of Health   Financial Resource Strain: Not on file  Food Insecurity: No Food Insecurity (12/17/2021)   Hunger Vital Sign    Worried About Running Out of Food in the Last Year: Never true    Ran Out of Food in the Last Year: Never true  Transportation Needs: No Transportation Needs (12/17/2021)   PRAPARE - Hydrologist (Medical): No    Lack of Transportation (Non-Medical): No  Physical Activity: Not on file  Stress: Not on file  Social Connections: Not on file     Family History: The patient's family history includes Bipolar disorder in his brother; Heart disease in his father; Hyperlipidemia in his mother; Hypertension in his mother; Ulcers in his father. There is  no history of Colon cancer, Colon polyps, Esophageal cancer, Rectal cancer, or Stomach cancer.  ROS:   Please see the history of present illness.    All other systems reviewed and are negative.  EKGs/Labs/Other Studies Reviewed:    The following studies were reviewed today:  Primary Discharge Diagnosis:  Paroxysmal Atrial Fibrillation Poor candidacy for long term anticoagulation due to  thrombocytopenia   Secondary Discharge Diagnosis:  -Nonalcoholic cirrhosis with splenomegaly -Portal venous hypertension  -Acute on chronic diastolic HF -Pulmonary fibrosis    Procedures This Admission:  Transeptal Puncture Intra-procedural TEE which showed no LAA thrombus Left atrial appendage occlusive device placement on 12/17/21 by Dr. Burt Foster.    Successful left atrial appendage occlusion with a 27 mm watchman flex device under fluoroscopic and TEE guidance   Recommend: Apixaban 5 mg BID x 6 weeks, then ASA 81 mg and plavix 75 mg daily through 6 months Cardiac CTA 8 weeks for post-implant surveillance   Echocardiogram 01/19/22:  Left ventricular ejection fraction, by estimation, is 55 to 60%. The left ventricle has normal function. The left ventricle demonstrates regional wall motion abnormalities (see scoring diagram/findings for description). The left ventricular internal cavity size was moderately dilated. Left ventricular diastolic function could not be evaluated. There is hypokinesis of the left ventricular, apical apical segment. 1. Right ventricular systolic function is normal. The right ventricular size is mildly enlarged. There is normal pulmonary artery systolic pressure. 2. 3. Left atrial size was moderately dilated. 4. Right atrial size was moderately dilated. The mitral valve is grossly normal. Trivial mitral valve regurgitation. No evidence of mitral stenosis. 5. The aortic valve is calcified. There is moderate calcification of the aortic valve. There is moderate  thickening of the aortic valve. Aortic valve regurgitation is not visualized. Moderate aortic valve stenosis. Aortic valve area, by VTI measures 1.39 cm. Aortic valve mean gradient measures 34.0 mmHg. Aortic valve Vmax measures 3.62 m/s. 6. The inferior vena cava is dilated in size  Pre LAAO CT 11/13/21:  IMPRESSION: 1. The left atrial appendage is a small chicken wing.   2. A 27 watchman FLX device is recommended based on the above landing zone measurements (21.7 mm maximum diameter; 22% compression).   3. There is no thrombus in the left atrial appendage.   4.  An mid-posterior IAS puncture site is recommended.   5. Optimal deployment angle: RAO CAU 10   6. Normal coronary origin. Right dominance. Prior CABG. Patent LIMA to LAD, SVG to diagonal. The SVG to acute marginal and SVG to PDA are not seen, and may be occluded.   7. Severe aortic valve calcification, AV calcium score 2784. Study was not performed to assess for TAVR candidacy.  EKG:  EKG is not ordered today.    Recent Labs: 02/12/2021: TSH 2.51 05/20/2021: B Natriuretic Peptide 1,190.0 12/17/2021: Hemoglobin 13.1; Platelets 76 12/18/2021: BUN 16; Creatinine, Ser 1.08; Potassium 3.8; Sodium 139 01/19/2022: ALT 16   Recent Lipid Panel    Component Value Date/Time   CHOL 118 02/12/2021 1438   CHOL 111 07/23/2019 1144   TRIG 66.0 02/12/2021 1438   HDL 40.30 02/12/2021 1438   HDL 48 07/23/2019 1144   CHOLHDL 3 02/12/2021 1438   VLDL 13.2 02/12/2021 1438   LDLCALC 65 02/12/2021 1438   LDLCALC 53 11/30/2019 0929   LDLDIRECT 198.2 07/11/2009 0925   Physical Exam:    VS:  BP 120/60   Pulse 63   Ht '5\' 6"'$  (1.676 m)   Wt 208 lb (94.3 kg)   SpO2 99%   BMI 33.57 kg/m     Wt Readings from Last 3 Encounters:  01/20/22 208 lb (94.3 kg)  12/29/21 210 lb 9.6 oz (95.5 kg)  12/17/21 209 lb 7 oz (95 kg)    General: Well developed, well nourished, NAD Neck: Negative for carotid bruits. No JVD Lungs:Diminished in  upper and lower lobes with no wheezes, rales, or rhonchi. Breathing is unlabored. Cardiovascular: RRR with S1 S2. + harsh murmur Extremities: No edema.  Neuro: Alert and oriented. No focal deficits. No facial asymmetry. MAE spontaneously. Psych: Responds to questions appropriately with normal affect.    ASSESSMENT/PLAN:    Paroxysmal atrial fibrillation: s/p LAAO with 37m Watchman device on 12/17/21. He was restarted on Eliquis x 6 weeks. He will continue through 12/31, then will stop and start ASA 81 mg and Plavix 75 mg daily (02/01/22) through 6 months (06/17/22). He is scheduled for a repeat CT scan 02/24/22 however will change this to pre TAVR scans in anticipation of TAVR in the future. He will require dental SBE for 6 months. Pre-CT instructions reviewed. Addend: He will see PCP 1/15 and will have CBC, BMET drawn at that time.  Moderate aortic stenosis: Echo from 07/2021 showed normal LV function, severe left ventricular enlargement, mild left ventricular hypertrophy, grade 2 diastolic dysfunction, mild right ventricular enlargement, moderate biatrial enlargement, mild mitral regurgitation, moderate aortic stenosis with mean gradient 32 mmHg, aortic valve area 1.4 cm and dimensionless index 0.34. Aortic valve calcium score noted to be 2791 suggesting more severe disease. Seen by Dr. CStanford Breedwith progressive DOE with repeat echo showing normal LV function, AVA by VTI at 1.39cm2, mean gradient at 358mg, peak 5257m, and DI at 0.37. Not likely a Progress trail patient given extensive lung disease. Scheduled for post LAAO CT 1/24 and will plan to change this to pre TAVR CT scans and link him back into see Dr. CooBurt Knackr further discussion of TAVR.   Pulmonary fibrosis with hypoxia: Recently seen by pulmonary and dx with ILD and prescribed Esbriet. CXR with chronic interstitial changes and mild pulmonary vascular congestion. Sent home with supplemental O2 2L after LAAO procedure. Not requiring O2 at this  time. Due for sleep study. Follows closely with pulmonary.   Chronic diastolic HF: Appears  euvolemic today on exam. Continue current regimen.   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    Incidental findings: 1.9 x 0.8 cm nodular opacity in the left lower lobe. Recommend PET-CT or follow-up chest CT in 3 months (to be done with 60-day CT). Follows closely with pulmonology for newly dx ILD. Will notify them of findings.    Medication Adjustments/Labs and Tests Ordered: Current medicines are reviewed at length with the patient today.  Concerns regarding medicines are outlined above.  No orders of the defined types were placed in this encounter.  Meds ordered this encounter  Medications   clopidogrel (PLAVIX) 75 MG tablet    Sig: Take 1 tablet (75 mg total) by mouth daily. START 02-01-2022    Dispense:  90 tablet    Refill:  3    Patient Instructions  Medication Instructions:  STOP TAKING :  ELIQUIS 12-31 ( LAST DOSE)   THEN START TAKING : PLAVIX 75 MG AND ASPRIN 81 MG ONCE A DAY  02-01-2022    *If you need a refill on your cardiac medications before your next appointment, please call your pharmacy*   Lab Work: NONE ORDERED  TODAY     If you have labs (blood work) drawn today and your tests are completely normal, you will receive your results only by: [place] (if you have MyChart) OR A paper copy in the mail If you have any lab test that is abnormal or we need to change your treatment, we will call you to review the results.   Test Procedures:  NONE ORDERED  TODAY    Follow-Up: At [facility], you and your health needs are our priority.  As part of our continuing mission to provide you with exceptional heart care, we have created designated Provider Care Teams.  These Care Teams include your primary  Cardiologist (physician) and Advanced Practice Providers (APPs -  Physician Assistants and Nurse Practitioners) who all work together to provide you with the care you need, when you need it.  We recommend signing up for the patient portal called "MyChart".  Sign up information is provided on this After Visit Summary.  MyChart is used to connect with patients for Virtual Visits (Telemedicine).  Patients are able to view lab/test results, encounter notes, upcoming appointments, etc.  Non-urgent messages can be sent to your provider as well.   To learn more about what you can do with MyChart, go to [email or web address].    Your next appointment:  AS SCHEDULED   1 year(s)  The format for your next appointment:   In Person  Provider:    Kathyrn Drown, NP   Other Instructions    Important Information About Sugar         Signed, Kathyrn Drown, NP  01/20/2022 1:48 PM    [facility]

## 2022-01-20 ENCOUNTER — Other Ambulatory Visit: Payer: Self-pay | Admitting: *Deleted

## 2022-01-20 ENCOUNTER — Ambulatory Visit: Payer: 59 | Attending: Cardiology | Admitting: Cardiology

## 2022-01-20 ENCOUNTER — Telehealth: Payer: Self-pay | Admitting: Gastroenterology

## 2022-01-20 ENCOUNTER — Encounter: Payer: Self-pay | Admitting: Cardiology

## 2022-01-20 VITALS — BP 120/60 | HR 63 | Ht 66.0 in | Wt 208.0 lb

## 2022-01-20 DIAGNOSIS — J849 Interstitial pulmonary disease, unspecified: Secondary | ICD-10-CM

## 2022-01-20 DIAGNOSIS — I5042 Chronic combined systolic (congestive) and diastolic (congestive) heart failure: Secondary | ICD-10-CM

## 2022-01-20 DIAGNOSIS — Z95818 Presence of other cardiac implants and grafts: Secondary | ICD-10-CM

## 2022-01-20 DIAGNOSIS — R0609 Other forms of dyspnea: Secondary | ICD-10-CM

## 2022-01-20 DIAGNOSIS — R911 Solitary pulmonary nodule: Secondary | ICD-10-CM

## 2022-01-20 DIAGNOSIS — I10 Essential (primary) hypertension: Secondary | ICD-10-CM | POA: Diagnosis not present

## 2022-01-20 DIAGNOSIS — I480 Paroxysmal atrial fibrillation: Secondary | ICD-10-CM

## 2022-01-20 DIAGNOSIS — I350 Nonrheumatic aortic (valve) stenosis: Secondary | ICD-10-CM

## 2022-01-20 DIAGNOSIS — D696 Thrombocytopenia, unspecified: Secondary | ICD-10-CM

## 2022-01-20 MED ORDER — CLOPIDOGREL BISULFATE 75 MG PO TABS: 75.0000 mg | ORAL_TABLET | Freq: Every day | ORAL | 3 refills | Status: DC

## 2022-01-20 NOTE — Telephone Encounter (Signed)
Returned call to patient. See 01-19-22 lab result note for details.

## 2022-01-20 NOTE — Patient Instructions (Addendum)
Medication Instructions:  STOP TAKING :  ELIQUIS 12-31 ( LAST DOSE)   THEN START TAKING : PLAVIX 75 MG AND ASPRIN 81 MG ONCE A DAY  02-01-2022    *If you need a refill on your cardiac medications before your next appointment, please call your pharmacy*   Lab Work: NONE ORDERED  TODAY     If you have labs (blood work) drawn today and your tests are completely normal, you will receive your results only by: [place] (if you have MyChart) OR A paper copy in the mail If you have any lab test that is abnormal or we need to change your treatment, we will call you to review the results.   Test Procedures:  NONE ORDERED  TODAY    Follow-Up: At [facility], you and your health needs are our priority.  As part of our continuing mission to provide you with exceptional heart care, we have created designated Provider Care Teams.  These Care Teams include your primary Cardiologist (physician) and Advanced Practice Providers (APPs -  Physician Assistants and Nurse Practitioners) who all work together to provide you with the care you need, when you need it.  We recommend signing up for the patient portal called "MyChart".  Sign up information is provided on this After Visit Summary.  MyChart is used to connect with patients for Virtual Visits (Telemedicine).  Patients are able to view lab/test results, encounter notes, upcoming appointments, etc.  Non-urgent messages can be sent to your provider as well.   To learn more about what you can do with MyChart, go to [email or web address].    Your next appointment:  AS SCHEDULED   1 year(s)  The format for your next appointment:   In Person  Provider:    Kathyrn Drown, NP   Other Instructions    Important Information About Sugar

## 2022-01-20 NOTE — Telephone Encounter (Signed)
Incoming call from patient states he is returning your call.

## 2022-01-21 ENCOUNTER — Other Ambulatory Visit: Payer: Self-pay | Admitting: Physician Assistant

## 2022-01-21 DIAGNOSIS — I350 Nonrheumatic aortic (valve) stenosis: Secondary | ICD-10-CM

## 2022-01-23 ENCOUNTER — Other Ambulatory Visit: Payer: Self-pay | Admitting: Hematology and Oncology

## 2022-01-26 ENCOUNTER — Other Ambulatory Visit: Payer: Self-pay | Admitting: Physician Assistant

## 2022-01-26 ENCOUNTER — Encounter: Payer: Self-pay | Admitting: Physician Assistant

## 2022-01-26 DIAGNOSIS — Z01810 Encounter for preprocedural cardiovascular examination: Secondary | ICD-10-CM

## 2022-01-26 NOTE — Progress Notes (Signed)
Repeat echo came back with progression of AS. Will convert post watchman scans to pre TAVR CT scans. Also L/RHC set up for 1/17 with Dr. Burt Knack   I have reviewed the risks, indications, and alternatives to cardiac catheterization and possible angioplasty/stenting with the patient. Risks include but are not limited to bleeding, infection, vascular injury, stroke, myocardial infection, arrhythmia, kidney injury, radiation-related injury in the case of prolonged fluoroscopy use, emergency cardiac surgery, and death. The patient understands the risks of serious complication is low (<1%).   Labs the week of 1/8.  Angelena Form PA-C  [facility]

## 2022-01-26 NOTE — H&P (View-Only) (Signed)
Repeat echo came back with progression of AS. Will convert post watchman scans to pre TAVR CT scans. Also L/RHC set up for 1/17 with Dr. Burt Knack   I have reviewed the risks, indications, and alternatives to cardiac catheterization and possible angioplasty/stenting with the patient. Risks include but are not limited to bleeding, infection, vascular injury, stroke, myocardial infection, arrhythmia, kidney injury, radiation-related injury in the case of prolonged fluoroscopy use, emergency cardiac surgery, and death. The patient understands the risks of serious complication is low (<6%).   Labs the week of 1/8.  Angelena Form PA-C  [facility]

## 2022-01-27 ENCOUNTER — Other Ambulatory Visit: Payer: Self-pay | Admitting: Cardiology

## 2022-01-27 NOTE — Telephone Encounter (Signed)
Prescription refill request for Eliquis received. Indication:  PAF Last office visit: 01/20/22  Tonny Branch NP Scr: 1.08 on 12/18/21 Age: 71 Weight: 94.3kg  Based on above findings Eliquis '5mg'$  twice daily is the appropriate dose.  Refill approved.

## 2022-02-08 ENCOUNTER — Other Ambulatory Visit (HOSPITAL_COMMUNITY): Payer: 59

## 2022-02-08 ENCOUNTER — Ambulatory Visit: Payer: 59 | Attending: Physician Assistant

## 2022-02-08 DIAGNOSIS — Z01810 Encounter for preprocedural cardiovascular examination: Secondary | ICD-10-CM

## 2022-02-09 LAB — BASIC METABOLIC PANEL
BUN/Creatinine Ratio: 16 (ref 10–24)
BUN: 14 mg/dL (ref 8–27)
CO2: 19 mmol/L — ABNORMAL LOW (ref 20–29)
Calcium: 9.4 mg/dL (ref 8.6–10.2)
Chloride: 107 mmol/L — ABNORMAL HIGH (ref 96–106)
Creatinine, Ser: 0.88 mg/dL (ref 0.76–1.27)
Glucose: 114 mg/dL — ABNORMAL HIGH (ref 70–99)
Potassium: 4.2 mmol/L (ref 3.5–5.2)
Sodium: 141 mmol/L (ref 134–144)
eGFR: 91 mL/min/{1.73_m2} (ref >59)

## 2022-02-10 LAB — CBC
Hematocrit: 40 % (ref 37.5–51.0)
Hemoglobin: 13.2 g/dL (ref 13.0–17.7)
MCH: 30.9 pg (ref 26.6–33.0)
MCHC: 33 g/dL (ref 31.5–35.7)
MCV: 94 fL (ref 79–97)
Platelets: 86 10*3/uL — CL (ref 150–450)
RBC: 4.27 x10E6/uL (ref 4.14–5.80)
RDW: 16 % — ABNORMAL HIGH (ref 11.6–15.4)
WBC: 4.2 10*3/uL (ref 3.4–10.8)

## 2022-02-11 ENCOUNTER — Other Ambulatory Visit: Payer: Self-pay | Admitting: Physician Assistant

## 2022-02-11 ENCOUNTER — Telehealth: Payer: Self-pay | Admitting: *Deleted

## 2022-02-11 NOTE — Telephone Encounter (Signed)
Cardiac Catheterization scheduled at [facility] for: Wednesday February 17, 2022 8:30 AM Arrival time and place: [place] Entrance A at: 6:30 AM  Nothing to eat after midnight prior to procedure, clear liquids until 5 AM day of procedure.  Medication instructions: -Hold:  Lasix/KCl-AM of procedure  Jardiance-AM of procedure  -Except hold medications usual morning medications can be taken with sips of water including aspirin 81 mg and Plavix 75 mg.  Confirmed patient has responsible adult to drive home post procedure and be with patient first 24 hours after arriving home.  Patient reports no new symptoms concerning for COVID-19 in the past 10 days.  Reviewed procedure instructions with patient.

## 2022-02-15 ENCOUNTER — Encounter: Payer: 59 | Admitting: Internal Medicine

## 2022-02-17 ENCOUNTER — Ambulatory Visit (HOSPITAL_COMMUNITY)
Admission: RE | Admit: 2022-02-17 | Discharge: 2022-02-17 | Disposition: A | Discharge status: Home / Self Care | Payer: 59 | Attending: Cardiovascular Disease | Admitting: Cardiovascular Disease

## 2022-02-17 ENCOUNTER — Other Ambulatory Visit: Payer: Self-pay

## 2022-02-17 ENCOUNTER — Ambulatory Visit (HOSPITAL_COMMUNITY)
Admission: RE | Disposition: A | Discharge status: Home / Self Care | Payer: Self-pay | Source: Home / Self Care | Attending: Cardiovascular Disease

## 2022-02-17 ENCOUNTER — Encounter (HOSPITAL_COMMUNITY): Payer: Self-pay | Admitting: Cardiovascular Disease

## 2022-02-17 DIAGNOSIS — K7460 Unspecified cirrhosis of liver: Secondary | ICD-10-CM | POA: Diagnosis not present

## 2022-02-17 DIAGNOSIS — I350 Nonrheumatic aortic (valve) stenosis: Secondary | ICD-10-CM

## 2022-02-17 DIAGNOSIS — I2510 Atherosclerotic heart disease of native coronary artery without angina pectoris: Secondary | ICD-10-CM | POA: Diagnosis not present

## 2022-02-17 DIAGNOSIS — I2582 Chronic total occlusion of coronary artery: Secondary | ICD-10-CM | POA: Diagnosis not present

## 2022-02-17 DIAGNOSIS — I25810 Atherosclerosis of coronary artery bypass graft(s) without angina pectoris: Secondary | ICD-10-CM | POA: Diagnosis not present

## 2022-02-17 DIAGNOSIS — Z951 Presence of aortocoronary bypass graft: Secondary | ICD-10-CM | POA: Diagnosis not present

## 2022-02-17 DIAGNOSIS — N189 Chronic kidney disease, unspecified: Secondary | ICD-10-CM | POA: Insufficient documentation

## 2022-02-17 DIAGNOSIS — K766 Portal hypertension: Secondary | ICD-10-CM | POA: Insufficient documentation

## 2022-02-17 DIAGNOSIS — I5033 Acute on chronic diastolic (congestive) heart failure: Secondary | ICD-10-CM | POA: Diagnosis not present

## 2022-02-17 DIAGNOSIS — J849 Interstitial pulmonary disease, unspecified: Secondary | ICD-10-CM | POA: Insufficient documentation

## 2022-02-17 DIAGNOSIS — I480 Paroxysmal atrial fibrillation: Secondary | ICD-10-CM | POA: Diagnosis not present

## 2022-02-17 DIAGNOSIS — I130 Hypertensive heart and chronic kidney disease with heart failure and stage 1 through stage 4 chronic kidney disease, or unspecified chronic kidney disease: Secondary | ICD-10-CM | POA: Diagnosis not present

## 2022-02-17 DIAGNOSIS — I2584 Coronary atherosclerosis due to calcified coronary lesion: Secondary | ICD-10-CM | POA: Insufficient documentation

## 2022-02-17 DIAGNOSIS — J9611 Chronic respiratory failure with hypoxia: Secondary | ICD-10-CM | POA: Insufficient documentation

## 2022-02-17 HISTORY — PX: RIGHT/LEFT HEART CATH AND CORONARY/GRAFT ANGIOGRAPHY: CATH118267

## 2022-02-17 LAB — POCT I-STAT 7, (LYTES, BLD GAS, ICA,H+H)
Acid-base deficit: 3 mmol/L — ABNORMAL HIGH (ref 0.0–2.0)
Bicarbonate: 20.8 mmol/L (ref 20.0–28.0)
Calcium, Ion: 1.23 mmol/L (ref 1.15–1.40)
HCT: 32 % — ABNORMAL LOW (ref 39.0–52.0)
Hemoglobin: 10.9 g/dL — ABNORMAL LOW (ref 13.0–17.0)
O2 Saturation: 94 %
Potassium: 3.6 mmol/L (ref 3.5–5.1)
Sodium: 142 mmol/L (ref 135–145)
TCO2: 22 mmol/L (ref 22–32)
pCO2 arterial: 30.4 mmHg — ABNORMAL LOW (ref 32–48)
pH, Arterial: 7.443 (ref 7.35–7.45)
pO2, Arterial: 65 mmHg — ABNORMAL LOW (ref 83–108)

## 2022-02-17 LAB — POCT I-STAT EG7
Acid-base deficit: 1 mmol/L (ref 0.0–2.0)
Bicarbonate: 22.5 mmol/L (ref 20.0–28.0)
Calcium, Ion: 1.23 mmol/L (ref 1.15–1.40)
HCT: 34 % — ABNORMAL LOW (ref 39.0–52.0)
Hemoglobin: 11.6 g/dL — ABNORMAL LOW (ref 13.0–17.0)
O2 Saturation: 79 %
Potassium: 3.8 mmol/L (ref 3.5–5.1)
Sodium: 142 mmol/L (ref 135–145)
TCO2: 23 mmol/L (ref 22–32)
pCO2, Ven: 32.5 mmHg — ABNORMAL LOW (ref 44–60)
pH, Ven: 7.447 — ABNORMAL HIGH (ref 7.25–7.43)
pO2, Ven: 41 mmHg (ref 32–45)

## 2022-02-17 SURGERY — RIGHT/LEFT HEART CATH AND CORONARY/GRAFT ANGIOGRAPHY
Anesthesia: LOCAL

## 2022-02-17 MED ORDER — ASPIRIN 81 MG PO CHEW: 81.0000 mg | CHEWABLE_TABLET | ORAL | Status: DC

## 2022-02-17 MED ORDER — HEPARIN SODIUM (PORCINE) 1000 UNIT/ML IJ SOLN
INTRAMUSCULAR | Status: AC
  Filled 2022-02-17: qty 10

## 2022-02-17 MED ORDER — SODIUM CHLORIDE 0.9 % IV SOLN: INTRAVENOUS | Status: DC

## 2022-02-17 MED ORDER — HEPARIN (PORCINE) IN NACL 1000-0.9 UT/500ML-% IV SOLN
INTRAVENOUS | Status: AC
  Filled 2022-02-17: qty 500

## 2022-02-17 MED ORDER — FENTANYL CITRATE (PF) 100 MCG/2ML IJ SOLN
INTRAMUSCULAR | Status: DC | PRN
  Administered 2022-02-17 (×2): 25 ug via INTRAVENOUS

## 2022-02-17 MED ORDER — FENTANYL CITRATE (PF) 100 MCG/2ML IJ SOLN
INTRAMUSCULAR | Status: AC
  Filled 2022-02-17: qty 2

## 2022-02-17 MED ORDER — MIDAZOLAM HCL 2 MG/2ML IJ SOLN
INTRAMUSCULAR | Status: DC | PRN
  Administered 2022-02-17 (×2): 1 mg via INTRAVENOUS

## 2022-02-17 MED ORDER — HYDRALAZINE HCL 20 MG/ML IJ SOLN: 10.0000 mg | INTRAMUSCULAR | Status: DC | PRN

## 2022-02-17 MED ORDER — SODIUM CHLORIDE 0.9 % IV SOLN: 250.0000 mL | INTRAVENOUS | Status: DC | PRN

## 2022-02-17 MED ORDER — SODIUM CHLORIDE 0.9% FLUSH: 3.0000 mL | Freq: Two times a day (BID) | INTRAVENOUS | Status: DC

## 2022-02-17 MED ORDER — LIDOCAINE HCL (PF) 1 % IJ SOLN
INTRAMUSCULAR | Status: AC
  Filled 2022-02-17: qty 30

## 2022-02-17 MED ORDER — HEPARIN (PORCINE) IN NACL 1000-0.9 UT/500ML-% IV SOLN
INTRAVENOUS | Status: DC | PRN
  Administered 2022-02-17 (×2): 500 mL

## 2022-02-17 MED ORDER — LIDOCAINE HCL (PF) 1 % IJ SOLN
INTRAMUSCULAR | Status: DC | PRN
  Administered 2022-02-17: 2 mL via INTRADERMAL
  Administered 2022-02-17: 20 mL via INTRADERMAL

## 2022-02-17 MED ORDER — ONDANSETRON HCL 4 MG/2ML IJ SOLN: 4.0000 mg | Freq: Four times a day (QID) | INTRAMUSCULAR | Status: DC | PRN

## 2022-02-17 MED ORDER — LABETALOL HCL 5 MG/ML IV SOLN: 10.0000 mg | INTRAVENOUS | Status: DC | PRN

## 2022-02-17 MED ORDER — CLOPIDOGREL BISULFATE 75 MG PO TABS: 75.0000 mg | ORAL_TABLET | ORAL | Status: DC

## 2022-02-17 MED ORDER — SODIUM CHLORIDE 0.9% FLUSH: 3.0000 mL | INTRAVENOUS | Status: DC | PRN

## 2022-02-17 MED ORDER — SODIUM CHLORIDE 0.9 % WEIGHT BASED INFUSION: 95.0000 mL/h | INTRAVENOUS | Status: DC

## 2022-02-17 MED ORDER — ACETAMINOPHEN 325 MG PO TABS: 650.0000 mg | ORAL_TABLET | ORAL | Status: DC | PRN

## 2022-02-17 MED ORDER — VERAPAMIL HCL 2.5 MG/ML IV SOLN
INTRAVENOUS | Status: AC
  Filled 2022-02-17: qty 2

## 2022-02-17 MED ORDER — SODIUM CHLORIDE 0.9 % WEIGHT BASED INFUSION
280.0000 mL/h | INTRAVENOUS | Status: AC
  Administered 2022-02-17: 280 mL/h via INTRAVENOUS

## 2022-02-17 MED ORDER — MIDAZOLAM HCL 2 MG/2ML IJ SOLN
INTRAMUSCULAR | Status: AC
  Filled 2022-02-17: qty 2

## 2022-02-17 SURGICAL SUPPLY — 19 items
CATH EXPO 5F MPA-1 (CATHETERS) IMPLANT
CATH INFINITI 5 FR AL2 (CATHETERS) IMPLANT
CATH INFINITI 5FR AL1 (CATHETERS) IMPLANT
CATH INFINITI 5FR MULTPACK ANG (CATHETERS) IMPLANT
CATH SWAN GANZ 7F STRAIGHT (CATHETERS) IMPLANT
CLOSURE MYNX CONTROL 5F (Vascular Products) IMPLANT
GLIDESHEATH SLEND SS 6F .021 (SHEATH) IMPLANT
GLIDESHEATH SLENDER 7FR .021G (SHEATH) IMPLANT
GUIDEWIRE INQWIRE 1.5J.035X260 (WIRE) IMPLANT
INQWIRE 1.5J .035X260CM (WIRE) ×1
KIT HEART LEFT (KITS) ×1 IMPLANT
KIT MICROPUNCTURE NIT STIFF (SHEATH) IMPLANT
PACK CARDIAC CATHETERIZATION (CUSTOM PROCEDURE TRAY) ×1 IMPLANT
SHEATH PINNACLE 5F 10CM (SHEATH) IMPLANT
SYR MEDRAD MARK 7 150ML (SYRINGE) ×1 IMPLANT
TRANSDUCER W/STOPCOCK (MISCELLANEOUS) ×1 IMPLANT
TUBING CIL FLEX 10 FLL-RA (TUBING) ×1 IMPLANT
WIRE EMERALD 3MM-J .035X150CM (WIRE) IMPLANT
WIRE EMERALD ST .035X150CM (WIRE) IMPLANT

## 2022-02-17 NOTE — Interval H&P Note (Signed)
History and Physical Interval Note:  02/17/2022 8:21 AM  [name]  has presented today for surgery, with the diagnosis of severe aortic stenosis.  The various methods of treatment have been discussed with the patient and family. After consideration of risks, benefits and other options for treatment, the patient has consented to  Procedure(s): RIGHT/LEFT HEART CATH AND CORONARY/GRAFT ANGIOGRAPHY (N/A) as a surgical intervention.  The patient's history has been reviewed, patient examined, no change in status, stable for surgery.  I have reviewed the patient's chart and labs.  Questions were answered to the patient's satisfaction.     [name]

## 2022-02-18 MED FILL — Verapamil HCl IV Soln 2.5 MG/ML: INTRAVENOUS | Qty: 2 | Status: AC

## 2022-02-24 ENCOUNTER — Ambulatory Visit (HOSPITAL_COMMUNITY)
Admission: RE | Admit: 2022-02-24 | Discharge: 2022-02-24 | Disposition: A | Discharge status: Home / Self Care | Payer: 59 | Source: Ambulatory Visit | Attending: Physician Assistant | Admitting: Physician Assistant

## 2022-02-24 ENCOUNTER — Other Ambulatory Visit (HOSPITAL_COMMUNITY): Payer: 59

## 2022-02-24 DIAGNOSIS — I350 Nonrheumatic aortic (valve) stenosis: Secondary | ICD-10-CM | POA: Insufficient documentation

## 2022-02-24 MED ORDER — IOHEXOL 350 MG/ML SOLN
100.0000 mL | Freq: Once | INTRAVENOUS | Status: AC | PRN
  Administered 2022-02-24: 100 mL via INTRAVENOUS

## 2022-02-24 NOTE — Progress Notes (Unsigned)
Procedure Type: Isolated AVR  Perioperative Outcome Estimate %  Operative Mortality 4.41%  Morbidity & Mortality 7.47%  Stroke 0.576%  Renal Failure 2.05%  Reoperation 3.37%  Prolonged Ventilation 3.99%  Deep Sternal Wound Infection 0.158%  [place] [facility] Stay (>14 days) 4.98%  [facility] Stay (<6 days)* 41.9%

## 2022-02-25 ENCOUNTER — Ambulatory Visit: Payer: 59 | Attending: Cardiovascular Disease | Admitting: Cardiovascular Disease

## 2022-02-25 ENCOUNTER — Encounter: Payer: Self-pay | Admitting: Cardiovascular Disease

## 2022-02-25 VITALS — BP 142/60 | HR 74 | Ht 66.0 in | Wt 212.6 lb

## 2022-02-25 DIAGNOSIS — I350 Nonrheumatic aortic (valve) stenosis: Secondary | ICD-10-CM | POA: Diagnosis not present

## 2022-02-25 NOTE — Progress Notes (Signed)
Cardiology Office Note:    Date:  03/02/2022   ID:  [name], DOB April 02, 1950, MRN [id number]  PCP:  Isaac Bliss, Rayford Halsted, MD   Deputy Providers Cardiologist:  Kirk Ruths, MD     Referring MD: Tommie Raymond, NP   Chief Complaint  Patient presents with   Aortic Stenosis    History of Present Illness:    [name] is a 72 y.o. male referred for evaluation of aortic stenosis by Dr Stanford Breed. The patient is known to me after recently undergoing LAAO with [facility] in November 2023.   The patient is here alone. He has known of a heart murmur for about 15 years. Other cardiovascular issues include paroxysmal atrial fibrillation, aortic stenosis, CAD s/p CABG, and hypertension. Pertinent medical problems include non-alcoholic cirrhosis and interstitial lung disease. He is followed by Dr Havery Moros with GI and Dr Vaughan Browner with pulmonology. He hasn't required continuous home oxygen, but uses O2 at night.   The patient has developed progressive fatigue and exertional dyspnea over the past year. He is now limited to low level physical activity. He is short of breath with ADL's and short distance walking. No orthopnea, PND, or lightheadedness/syncope. He has chronic leg swelling. He underwent CT evaluation and was noted to have a very high aortic valve calcium score of 2791. Echo showed LVEF 55-60%, normal RV function, normal mitral valve function, and moderate aortic stenosis with a mean gradient of 34 mmHg, VMax 3.62 m/s, and peak gradient 52 mmHg. He has undergone cardiac cath and CTA evaluation as well. Cath showed severe native vessel CAD with continued patency of the LIMA-LAD graft and SVG-diagonal. His other grafts were occluded, but the native RCA had no severe flow-limiting lesions. CTA showed severe aortic valve calcification and restricted leaflet mobility with measurements suitable for a 26 mm Sapien 3 THV. Considering his progressive symptoms, he is  referred for TAVR evaluation.   Past Medical History:  Diagnosis Date   Adjustment disorder with depressed mood 09/07/2007   Cataract    removed bilaterally   Chronic kidney disease    kidney stones   Cirrhosis ([place])    Colon polyps    Tubular Adenoma 2010   CONGESTIVE HEART FAILURE 12/09/2008   CORONARY ARTERY DISEASE 2006   HYPERLIPIDEMIA 08/09/2006   HYPERTENSION 08/09/2006   MYOCARDIAL INFARCTION, HX OF 07/07/2004   NEPHROLITHIASIS, HX OF 08/09/2006   OBESITY 07/19/2008   Presence of Watchman left atrial appendage closure device 12/17/2021   19m [facility] with Dr. CBurt Knack  Sleep apnea    has a cpap - not using religiously    Past Surgical History:  Procedure Laterality Date   BIOPSY  11/18/2020   Procedure: BIOPSY;  Surgeon: AYetta Flock MD;  [facility];  Service: Gastroenterology;;   CARDIOVERSION N/A 12/14/2017   Procedure: CARDIOVERSION;  Surgeon: RSkeet Latch MD;  [place] [facility]  Service: Cardiovascular;  Laterality: N/A;   CARDIOVERSION N/A 10/01/2019   Procedure: CARDIOVERSION;  Surgeon: CBuford Dresser MD;  [place] [facility]  Service: Cardiovascular;  Laterality: N/A;   COLONOSCOPY     COLONOSCOPY WITH PROPOFOL N/A 11/18/2020   Procedure: COLONOSCOPY WITH PROPOFOL;  Surgeon: AYetta Flock MD;  [facility];  Service: Gastroenterology;  Laterality: N/A;   CORONARY ARTERY BYPASS GRAFT  2006   ESOPHAGOGASTRODUODENOSCOPY (EGD) WITH PROPOFOL N/A 11/18/2020   Procedure: ESOPHAGOGASTRODUODENOSCOPY (EGD) WITH PROPOFOL;  Surgeon: AYetta Flock MD;  [facility];  Service: Gastroenterology;  Laterality: N/A;   LEFT ATRIAL APPENDAGE OCCLUSION N/A 12/17/2021   Procedure: LEFT ATRIAL APPENDAGE OCCLUSION;  Surgeon: [name], MD;  Location: [place] [facility];  Service: Cardiovascular;  Laterality: N/A;   POLYPECTOMY     POLYPECTOMY  11/18/2020   Procedure: POLYPECTOMY;  Surgeon:  Yetta Flock, MD;  [facility];  Service: Gastroenterology;;   RIGHT/LEFT HEART CATH AND CORONARY/GRAFT ANGIOGRAPHY N/A 02/17/2022   Procedure: RIGHT/LEFT HEART CATH AND CORONARY/GRAFT ANGIOGRAPHY;  Surgeon: [name], MD;  Location: [place] [facility];  Service: Cardiovascular;  Laterality: N/A;   TEE WITHOUT CARDIOVERSION N/A 12/17/2021   Procedure: TRANSESOPHAGEAL ECHOCARDIOGRAM (TEE);  Surgeon: [name], MD;  Location: [place] [facility];  Service: Cardiovascular;  Laterality: N/A;    Current Medications: Current Meds  Medication Sig   acetaminophen (TYLENOL) 500 MG tablet Take 1,000 mg by mouth daily as needed for moderate pain or headache.   aspirin EC 81 MG tablet Take 81 mg by mouth daily. START 02-01-2022   atorvastatin (LIPITOR) 80 MG tablet TAKE 1 TABLET BY MOUTH EVERY DAY   carvedilol (COREG) 3.125 MG tablet TAKE 1 TABLET BY MOUTH TWICE A DAY WITH FOOD   clopidogrel (PLAVIX) 75 MG tablet Take 1 tablet (75 mg total) by mouth daily. START 02-01-2022   [employer] VITAMIN B12 1000 MCG tablet TAKE 1 TABLET BY MOUTH EVERY DAY   empagliflozin (JARDIANCE) 10 MG TABS tablet Take 1 tablet (10 mg total) by mouth daily before breakfast.   furosemide (LASIX) 40 MG tablet Take 2 tablets (80 mg total) by mouth 2 (two) times daily.   KLOR-CON M20 20 MEQ tablet TAKE 1 TABLET BY MOUTH EVERY DAY   Misc Natural Products (LIVER PROTECT PO) Take 1 capsule by mouth daily. From [employer]   Pirfenidone (ESBRIET) 267 MG TABS Take 3 tablets (801 mg total) by mouth 3 (three) times daily with meals. Month 2 and onwards (maintenance)   tamsulosin (FLOMAX) 0.4 MG CAPS capsule TAKE ONE CAPSULE BY MOUTH EVERY DAY AFTER SUPPER     Allergies:   Patient has no known allergies.   Social History   Socioeconomic History   Marital status: Significant Other    Spouse name: Not on file   Number of children: 0   Years of education: Not on file   Highest education level: Not on file   Occupational History   Occupation: Passenger transport manager  Tobacco Use   Smoking status: Never   Smokeless tobacco: Never  Vaping Use   Vaping Use: Never used  Substance and Sexual Activity   Alcohol use: Yes    Alcohol/week: 1.0 standard drink of alcohol    Types: 1 Cans of beer per week    Comment: occasional   Drug use: No   Sexual activity: Not on file  Other Topics Concern   Not on file  Social History Narrative   Not on file   Social Determinants of Health   Financial Resource Strain: Not on file  Food Insecurity: No Food Insecurity (12/17/2021)   Hunger Vital Sign    Worried About Running Out of Food in the Last Year: Never true    Ran Out of Food in the Last Year: Never true  Transportation Needs: No Transportation Needs (12/17/2021)   PRAPARE - Hydrologist (Medical): No    Lack of Transportation (Non-Medical): No  Physical Activity: Not on file  Stress: Not on file  Social Connections: Not on  file     Family History: The patient's family history includes Bipolar disorder in his brother; Heart disease in his father; Hyperlipidemia in his mother; Hypertension in his mother; Ulcers in his father. There is no history of Colon cancer, Colon polyps, Esophageal cancer, Rectal cancer, or Stomach cancer.  ROS:   Please see the history of present illness.    All other systems reviewed and are negative.  EKGs/Labs/Other Studies Reviewed:    The following studies were reviewed today: Echo 01/19/2022: 1. Left ventricular ejection fraction, by estimation, is 55 to 60%. The  left ventricle has normal function. The left ventricle demonstrates  regional wall motion abnormalities (see scoring diagram/findings for  description). The left ventricular internal  cavity size was moderately dilated. Left ventricular diastolic function  could not be evaluated. There is hypokinesis of the left ventricular,  apical apical segment.   2.  Right ventricular systolic function is normal. The right ventricular  size is mildly enlarged. There is normal pulmonary artery systolic  pressure.   3. Left atrial size was moderately dilated.   4. Right atrial size was moderately dilated.   5. The mitral valve is grossly normal. Trivial mitral valve  regurgitation. No evidence of mitral stenosis.   6. The aortic valve is calcified. There is moderate calcification of the  aortic valve. There is moderate thickening of the aortic valve. Aortic  valve regurgitation is not visualized. Moderate aortic valve stenosis.  Aortic valve area, by VTI measures  1.39 cm. Aortic valve mean gradient measures 34.0 mmHg. Aortic valve Vmax  measures 3.62 m/s.   7. The inferior vena cava is dilated in size with <50% respiratory  variability, suggesting right atrial pressure of 15 mmHg.   Comparison(s): No significant change from prior study.   Conclusion(s)/Recommendation(s): Moderate AS, similar to prior. Mean  gradient 34, peak gradient 52, peak velocity 3.62, DI 0.37, AVA 1.37.   CTA Chest/Abd/Pelvis: AORTA:   Minimal Aortic Diameter-13.8 mm   Severity of Aortic Calcification-severe   RIGHT PELVIS:   Right Common Iliac Artery -   Minimal Diameter-8.6 mm   Tortuosity-mild   Calcification-moderate   Right External Iliac Artery -   Minimal Diameter-9.8 mm   Tortuosity-mild   Calcification-mild   Right Common Femoral Artery -   Minimal Diameter-6.8 mm   Tortuosity-none   Calcification-moderate   LEFT PELVIS:   Left Common Iliac Artery -   Minimal Diameter-8.0 mm   Tortuosity-mild   Calcification-moderate   Left External Iliac Artery -   Minimal Diameter-8.5 mm   Tortuosity-moderate   Calcification-mild   Left Common Femoral Artery -   Minimal Diameter-6.5 mm   Tortuosity-none   Calcification-moderate   Review of the MIP images confirms the above findings.   IMPRESSION: 1. Vascular findings and  measurements pertinent to potential TAVR procedure, as detailed above. 2. Thickening and calcification of the aortic valve, compatible with reported clinical history of aortic stenosis. 3. Moderate to severe aortoiliac atherosclerosis. Severe aortic valve calcifications status post CABG. 4. Cirrhotic liver morphology with sequela of portal hypertension including splenomegaly and large splenorenal varices.     Cardiac CTA 02/24/2022: FINDINGS: Image quality: Poor.  Contrast bolus in the subclavian vein.   Noise artifact is: Moderate.   Valve Morphology: Tricuspid aortic valve with diffuse severe calcifications. Restricted movement in systole.   Aortic Valve Calcium score: 2445   Aortic annular dimension:   Phase assessed: 20%   Annular area: 511 mm2   Annular perimeter: 82.1 mm  Max diameter: 29.2 mm   Min diameter: 23.2 mm   Annular and subannular calcification: Moderate single protruding calcification under the LCC, extending into the LVOT.   Membranous septum length: 10.8 mm   Optimal coplanar projection: LAO 0 CRA 0   Coronary Artery Height above Annulus:   Left Main: 15.3 mm   Right Coronary: 19.0 mm   Sinus of Valsalva Measurements:   Non-coronary: 35 mm   Right-coronary: 35 mm   Left-coronary: 35 mm   Sinus of Valsalva Height:   Non-coronary: 25.1 mm   Right-coronary: 25.3 mm   Left-coronary: 24.9 mm   Sinotubular Junction: 29 mm   Ascending Thoracic Aorta: 36.7 mm   Coronary Arteries: Normal coronary origin. Right dominance. The study was performed without use of NTG and is insufficient for plaque evaluation. Native 3-vessel coronary calcifications. S/p CABG. Graft patency not assessed due to poor quality study. Please refer to recent cardiac catheterization for coronary assessment.   Cardiac Morphology:   Right Atrium: Right atrial size is within normal limits.   Right Ventricle: The right ventricular cavity is within  normal limits.   Left Atrium: Left atrial size is normal in size. A left atrial appendage closure device is present with a small ~3 mm device leak.   Left Ventricle: The ventricular cavity size is within normal limits.   Pulmonary arteries: Normal in size without proximal filling defect.   Pulmonary veins: Normal pulmonary venous drainage.   Pericardium: Normal thickness with no significant effusion or calcium present.   Mitral Valve: The mitral valve is normal structure with mild annular calcium.   Extra-cardiac findings: See attached radiology report for non-cardiac structures.   IMPRESSION: 1. Poor quality study but interpretable. Annular measurements favor a 26 mm Sapien 3 or 34 mm Evolut Pro.   2. Moderate single protruding calcification under the LCC, extending into the LVOT.   3. Sufficient coronary to annulus distance.   4. Optimal Fluoroscopic Angle for Delivery: LAO 0 CRA 0   5. LAA closure device present with evidence of ~3 mm device leak.   6. S/p CABG.  Cardiac Cath 02/17/2022: 1.  Multivessel coronary artery disease with moderate left main stenosis, total occlusion of the proximal LAD, patency of the left circumflex with mild nonobstructive disease, and moderate heavily calcified RCA stenosis involving a large, dominant vessel 2.  Severe aortic stenosis by echo assessment, cardiac catheterization demonstrating mean transvalvular gradient 27 mmHg 3.  Mildly elevated intracardiac diastolic filling pressures with right atrial pressure of 9, RV pressure 47/10, PA pressure 57/9 with a mean of 25, and LVEDP 14 mmHg.  Pulmonary capillary wedge pressure unable to accurately determine 4.  Status post aortocoronary bypass surgery with continued patency of the LIMA to LAD and saphenous vein graft to first diagonal.  Chronic occlusion of the saphenous vein graft OM and saphenous vein graft to PDA.   Recommendations: Medical therapy for coronary artery disease, continue  treatment for congestive heart failure, continued evaluation for TAVR  Diagnostic Dominance: Right  EKG:  EKG is not ordered today.    Recent Labs: 05/20/2021: B Natriuretic Peptide 1,190.0 01/19/2022: ALT 16 02/08/2022: BUN 14; Creatinine, Ser 0.88; Platelets 86 02/17/2022: Hemoglobin 10.9; Potassium 3.6; Sodium 142  Recent Lipid Panel    Component Value Date/Time   CHOL 118 02/12/2021 1438   CHOL 111 07/23/2019 1144   TRIG 66.0 02/12/2021 1438   HDL 40.30 02/12/2021 1438   HDL 48 07/23/2019 1144   CHOLHDL 3 02/12/2021 1438  VLDL 13.2 02/12/2021 1438   LDLCALC 65 02/12/2021 1438   LDLCALC 53 11/30/2019 0929   LDLDIRECT 198.2 07/11/2009 0925     Risk Assessment/Calculations:    CHA2DS2-VASc Score = 4   This indicates a 4.8% annual risk of stroke. The patient's score is based upon: CHF History: 1 HTN History: 1 Diabetes History: 0 Stroke History: 0 Vascular Disease History: 1 Age Score: 1 Gender Score: 0           Physical Exam:    VS:  BP (!) 142/60   Pulse 74   Ht '5\' 6"'$  (1.676 m)   Wt 212 lb 9.6 oz (96.4 kg)   SpO2 (!) 89%   BMI 34.31 kg/m     Wt Readings from Last 3 Encounters:  03/01/22 212 lb (96.2 kg)  02/25/22 212 lb 9.6 oz (96.4 kg)  02/17/22 204 lb (92.5 kg)     GEN:  Well nourished, well developed in no acute distress HEENT: Normal NECK: No JVD; No carotid bruits LYMPHATICS: No lymphadenopathy CARDIAC: RRR, 3/6 harsh crescendo decrescendo murmur at the apex, late peaking with absent A2 RESPIRATORY:  Clear to auscultation without rales, wheezing or rhonchi  ABDOMEN: Soft, non-tender, non-distended MUSCULOSKELETAL:  2+ bilateral pretibial edema; No deformity  SKIN: Warm and dry NEUROLOGIC:  Alert and oriented x 3 PSYCHIATRIC:  Normal affect   ASSESSMENT:    1. Nonrheumatic aortic valve stenosis    PLAN:    In order of problems listed above:  Pt with moderately severe symptomatic aortic stenosis, NYHA functional class 3 limitation.  All available studies personally reviewed. The aortic valve by echo is calcified and severely restricted with a mean gradient of 34 mmHg. Cardiac cath showed calcified, restricted leaflet mobility, and moderately elevated mean gradient of 27 mmHg requiring a directional catheter and straight-tip wire to cross the valve. The patient's exam, progressive symptoms of dyspnea and fatigue, history of elevated BNP, and high aortic valve Ca score of approximately 2500 all suggest hemodynamically significant aortic stenosis. He has significant comorbidity including nonalcoholic cirrhosis, thrombocytopenia, and pulmonary fibrosis. I have reviewed the natural history of aortic stenosis with the patient today. We have discussed the limitations of medical therapy and the poor prognosis associated with symptomatic aortic stenosis. We have reviewed potential treatment options, including palliative medical therapy, conventional surgical aortic valve replacement, and transcatheter aortic valve replacement. We discussed treatment options in the context of the patient's specific comorbid medical conditions.  With progressive symptoms, evidence of myocardial strain with elevated BNP, and a typical exam of severe aortic stenosis, I think it is appropriate to proceed with multidisciplinary evaluation for TAVR. I have asked him to discontinue ASA today and he will remain on clopidogrel until he is 6 months out from [facility] implantation. As part of his evaluation, we discussed the TAVR procedure, it's potential risks, benefits, and alternatives. He would like to continue with this evaluation and will be referred for formal cardiac surgical consultation.            Medication Adjustments/Labs and Tests Ordered: Current medicines are reviewed at length with the patient today.  Concerns regarding medicines are outlined above.  No orders of the defined types were placed in this encounter.  No orders of the defined types were placed  in this encounter.   Patient Instructions  Medication Instructions:  STOP Aspirin *If you need a refill on your cardiac medications before your next appointment, please call your pharmacy*   Lab Work: NONE If you  have labs (blood work) drawn today and your tests are completely normal, you will receive your results only by: [place] (if you have MyChart) OR A paper copy in the mail If you have any lab test that is abnormal or we need to change your treatment, we will call you to review the results.   Testing/Procedures: NONE   Follow-Up: At [facility], you and your health needs are our priority.  As part of our continuing mission to provide you with exceptional heart care, we have created designated Provider Care Teams.  These Care Teams include your primary Cardiologist (physician) and Advanced Practice Providers (APPs -  Physician Assistants and Nurse Practitioners) who all work together to provide you with the care you need, when you need it.  We recommend signing up for the patient portal called "MyChart".  Sign up information is provided on this After Visit Summary.  MyChart is used to connect with patients for Virtual Visits (Telemedicine).  Patients are able to view lab/test results, encounter notes, upcoming appointments, etc.  Non-urgent messages can be sent to your provider as well.   To learn more about what you can do with MyChart, go to [email or web address].    Your next appointment:   Structural team will follow-up  Provider:   Talbot Grumbling, MD     Signed, [name], MD  03/02/2022 1:06 PM    [place]

## 2022-02-25 NOTE — Progress Notes (Signed)
Pre Surgical Assessment: 5 M Walk Test  17M=16.64f  5 Meter Walk Test- trial 1: 7.30 seconds 5 Meter Walk Test- trial 2: 7.02 seconds 5 Meter Walk Test- trial 3: 7.41 seconds 5 Meter Walk Test Average: 7.24 seconds

## 2022-02-25 NOTE — Patient Instructions (Signed)
Medication Instructions:  STOP Aspirin *If you need a refill on your cardiac medications before your next appointment, please call your pharmacy*   Lab Work: NONE If you have labs (blood work) drawn today and your tests are completely normal, you will receive your results only by: [place] (if you have MyChart) OR A paper copy in the mail If you have any lab test that is abnormal or we need to change your treatment, we will call you to review the results.   Testing/Procedures: NONE   Follow-Up: At [facility], you and your health needs are our priority.  As part of our continuing mission to provide you with exceptional heart care, we have created designated Provider Care Teams.  These Care Teams include your primary Cardiologist (physician) and Advanced Practice Providers (APPs -  Physician Assistants and Nurse Practitioners) who all work together to provide you with the care you need, when you need it.  We recommend signing up for the patient portal called "MyChart".  Sign up information is provided on this After Visit Summary.  MyChart is used to connect with patients for Virtual Visits (Telemedicine).  Patients are able to view lab/test results, encounter notes, upcoming appointments, etc.  Non-urgent messages can be sent to your provider as well.   To learn more about what you can do with MyChart, go to [email or web address].    Your next appointment:   Structural team will follow-up  Provider:   Talbot Grumbling, MD

## 2022-02-26 ENCOUNTER — Ambulatory Visit: Payer: 59 | Admitting: Neurology

## 2022-02-28 NOTE — Progress Notes (Unsigned)
[address]       ,[place] [address]             [phone]           [name] [facility] Medical Record #[id number] Date of Birth: [birth year]  Tony Perla, MD Erline Hau, MD  Chief Complaint: Dyspnea  History of Present Illness:     72 yo male who has had a previous CABG in 2006 and has been followed for moderate AS. Pt has been having increasing DOE over the past several months.He denies CP or light headedness or syncope. He has lower extremity edema if he fails to take his lasix.  He has also been diagnosed with nonalcoholic liver cirrhosis and splenomegaly and pulmonary fibrosis. He has chronic atrial fibrillation and is sp watchman device placement last year. He has been on home oxygen therapy at 2liters since the [place] procedure.  Due to increasing DOE, a repeat echo was performed demonstrating a bulky calcified aortic valve with a mean gradient of 59mHg and normal LV function. He had a very elevated calcium score of 2700. Pt was felt that due to his worsening symptoms and his underlying pulmonary fibrosis that the degree of aortic stenosis warented proceeding with AVR and cath and TAVR CTA were performed. Pt with ongoing patency of his LIMA and diag grafts but with chronic occlusion of his other SVGs.       Past Medical History:  Diagnosis Date   Adjustment disorder with depressed mood 09/07/2007   Cataract    removed bilaterally   Chronic kidney disease    kidney stones   [place]    Colon polyps    Tubular Adenoma 2010   CONGESTIVE HEART FAILURE 12/09/2008   CORONARY ARTERY DISEASE 2006   HYPERLIPIDEMIA 08/09/2006   HYPERTENSION 08/09/2006   MYOCARDIAL INFARCTION, HX OF 07/07/2004   NEPHROLITHIASIS, HX OF 08/09/2006   OBESITY 07/19/2008   Presence of Watchman left atrial appendage closure device 12/17/2021   29mWatchman with Dr. CoBurt Knack Sleep apnea    has a cpap - not using religiously    Past  Surgical History:  Procedure Laterality Date   BIOPSY  11/18/2020   Procedure: BIOPSY;  Surgeon: ArYetta FlockMD;  [facility];  Service: Gastroenterology;;   CARDIOVERSION N/A 12/14/2017   Procedure: CARDIOVERSION;  Surgeon: RaSkeet LatchMD;  [facility] Service: Cardiovascular;  Laterality: N/A;   CARDIOVERSION N/A 10/01/2019   Procedure: CARDIOVERSION;  Surgeon: ChBuford DresserMD;  [facility] Service: Cardiovascular;  Laterality: N/A;   COLONOSCOPY     COLONOSCOPY WITH PROPOFOL N/A 11/18/2020   Procedure: COLONOSCOPY WITH PROPOFOL;  Surgeon: ArYetta FlockMD;  [facility];  Service: Gastroenterology;  Laterality: N/A;   CORONARY ARTERY BYPASS GRAFT  2006   ESOPHAGOGASTRODUODENOSCOPY (EGD) WITH PROPOFOL N/A 11/18/2020   Procedure: ESOPHAGOGASTRODUODENOSCOPY (EGD) WITH PROPOFOL;  Surgeon: ArYetta FlockMD;  [facility];  Service: Gastroenterology;  Laterality: N/A;   LEFT ATRIAL APPENDAGE OCCLUSION N/A 12/17/2021   Procedure: LEFT ATRIAL APPENDAGE OCCLUSION;  Surgeon: CoSherren MochaMD;  [facility];  Service: Cardiovascular;  Laterality: N/A;   POLYPECTOMY     POLYPECTOMY  11/18/2020   Procedure: POLYPECTOMY;  Surgeon: ArYetta FlockMD;  [facility];  Service: Gastroenterology;;   RIGHT/LEFT HEART CATH AND CORONARY/GRAFT ANGIOGRAPHY N/A 02/17/2022   Procedure: RIGHT/LEFT HEART CATH AND CORONARY/GRAFT  ANGIOGRAPHY;  Surgeon: [name], MD;  Location: [facility] [facility];  Service: Cardiovascular;  Laterality: N/A;   TEE WITHOUT CARDIOVERSION N/A 12/17/2021   Procedure: TRANSESOPHAGEAL ECHOCARDIOGRAM (TEE);  Surgeon: [name], MD;  Location: [facility];  Service: Cardiovascular;  Laterality: N/A;    Social History   Tobacco Use  Smoking Status Never  Smokeless Tobacco Never    Social History   Substance and Sexual Activity   Alcohol Use Yes   Alcohol/week: 1.0 standard drink of alcohol   Types: 1 Cans of beer per week   Comment: occasional    Social History   Socioeconomic History   Marital status: Significant Other    Spouse name: Not on file   Number of children: 0   Years of education: Not on file   Highest education level: Not on file  Occupational History   Occupation: Passenger transport manager  Tobacco Use   Smoking status: Never   Smokeless tobacco: Never  Vaping Use   Vaping Use: Never used  Substance and Sexual Activity   Alcohol use: Yes    Alcohol/week: 1.0 standard drink of alcohol    Types: 1 Cans of beer per week    Comment: occasional   Drug use: No   Sexual activity: Not on file  Other Topics Concern   Not on file  Social History Narrative   Not on file   Social Determinants of Health   Financial Resource Strain: Not on file  Food Insecurity: No Food Insecurity (12/17/2021)   Hunger Vital Sign    Worried About Running Out of Food in the Last Year: Never true    Ran Out of Food in the Last Year: Never true  Transportation Needs: No Transportation Needs (12/17/2021)   PRAPARE - Hydrologist (Medical): No    Lack of Transportation (Non-Medical): No  Physical Activity: Not on file  Stress: Not on file  Social Connections: Not on file  Intimate Partner Violence: Not At Risk (12/17/2021)   Humiliation, Afraid, Rape, and Kick questionnaire    Fear of Current or Ex-Partner: No    Emotionally Abused: No    Physically Abused: No    Sexually Abused: No    No Known Allergies  Current Outpatient Medications  Medication Sig Dispense Refill   acetaminophen (TYLENOL) 500 MG tablet Take 1,000 mg by mouth daily as needed for moderate pain or headache.     aspirin EC 81 MG tablet Take 81 mg by mouth daily. START 02-01-2022     atorvastatin (LIPITOR) 80 MG tablet TAKE 1 TABLET BY MOUTH EVERY DAY 90 tablet 1   carvedilol (COREG) 3.125 MG tablet  TAKE 1 TABLET BY MOUTH TWICE A DAY WITH FOOD 60 tablet 5   clopidogrel (PLAVIX) 75 MG tablet Take 1 tablet (75 mg total) by mouth daily. START 02-01-2022 90 tablet 3   [employer] VITAMIN B12 1000 MCG tablet TAKE 1 TABLET BY MOUTH EVERY DAY 180 tablet 1   empagliflozin (JARDIANCE) 10 MG TABS tablet Take 1 tablet (10 mg total) by mouth daily before breakfast. 30 tablet 11   furosemide (LASIX) 40 MG tablet Take 2 tablets (80 mg total) by mouth 2 (two) times daily. 360 tablet 3   KLOR-CON M20 20 MEQ tablet TAKE 1 TABLET BY MOUTH EVERY DAY 30 tablet 11   Misc Natural Products (LIVER PROTECT PO) Take 1 capsule by mouth daily. From [employer]     Pirfenidone (ESBRIET)  267 MG TABS Take 3 tablets (801 mg total) by mouth 3 (three) times daily with meals. Month 2 and onwards (maintenance) 270 tablet 4   tamsulosin (FLOMAX) 0.4 MG CAPS capsule TAKE ONE CAPSULE BY MOUTH EVERY DAY AFTER SUPPER 90 capsule 1   No current facility-administered medications for this visit.     Family History  Problem Relation Age of Onset   Hypertension Mother    Hyperlipidemia Mother    Heart disease Father    Ulcers Father    Bipolar disorder Brother    Colon cancer Neg Hx    Colon polyps Neg Hx    Esophageal cancer Neg Hx    Rectal cancer Neg Hx    Stomach cancer Neg Hx        Physical Exam:      Diagnostic Studies & Laboratory data: I have personally reviewed the following studies and agree with the findings   TTE (01/2022) IMPRESSIONS     1. Left ventricular ejection fraction, by estimation, is 55 to 60%. The  left ventricle has normal function. The left ventricle demonstrates  regional wall motion abnormalities (see scoring diagram/findings for  description). The left ventricular internal  cavity size was moderately dilated. Left ventricular diastolic function  could not be evaluated. There is hypokinesis of the left ventricular,  apical apical segment.   2. Right ventricular systolic function is  normal. The right ventricular  size is mildly enlarged. There is normal pulmonary artery systolic  pressure.   3. Left atrial size was moderately dilated.   4. Right atrial size was moderately dilated.   5. The mitral valve is grossly normal. Trivial mitral valve  regurgitation. No evidence of mitral stenosis.   6. The aortic valve is calcified. There is moderate calcification of the  aortic valve. There is moderate thickening of the aortic valve. Aortic  valve regurgitation is not visualized. Moderate aortic valve stenosis.  Aortic valve area, by VTI measures  1.39 cm. Aortic valve mean gradient measures 34.0 mmHg. Aortic valve Vmax  measures 3.62 m/s.   7. The inferior vena cava is dilated in size with <50% respiratory  variability, suggesting right atrial pressure of 15 mmHg.   Comparison(s): No significant change from prior study.   Conclusion(s)/Recommendation(s): Moderate AS, similar to prior. Mean  gradient 34, peak gradient 52, peak velocity 3.62, DI 0.37, AVA 1.37.   FINDINGS   Left Ventricle: Left ventricular ejection fraction, by estimation, is 55  to 60%. The left ventricle has normal function. The left ventricle  demonstrates regional wall motion abnormalities. The left ventricular  internal cavity size was moderately  dilated. There is no left ventricular hypertrophy. Left ventricular  diastolic function could not be evaluated.   Right Ventricle: The right ventricular size is mildly enlarged. Right  vetricular wall thickness was not well visualized. Right ventricular  systolic function is normal. There is normal pulmonary artery systolic  pressure. The tricuspid regurgitant velocity   is 2.12 m/s, and with an assumed right atrial pressure of 15 mmHg, the  estimated right ventricular systolic pressure is 79.8 mmHg.   Left Atrium: Left atrial size was moderately dilated.   Right Atrium: Right atrial size was moderately dilated.   Pericardium: There is no evidence  of pericardial effusion.   Mitral Valve: The mitral valve is grossly normal. Trivial mitral valve  regurgitation. No evidence of mitral valve stenosis. MV peak gradient,  11.6 mmHg. The mean mitral valve gradient is 6.0 mmHg.   Tricuspid Valve:  The tricuspid valve is grossly normal. Tricuspid valve  regurgitation is trivial. No evidence of tricuspid stenosis.   Aortic Valve: The aortic valve is calcified. There is moderate  calcification of the aortic valve. There is moderate thickening of the  aortic valve. Aortic valve regurgitation is not visualized. Moderate  aortic stenosis is present. Aortic valve mean  gradient measures 34.0 mmHg. Aortic valve peak gradient measures 52.4  mmHg. Aortic valve area, by VTI measures 1.39 cm.   Pulmonic Valve: The pulmonic valve was not well visualized. Pulmonic valve  regurgitation is not visualized. No evidence of pulmonic stenosis.   Aorta: The aortic root and ascending aorta are structurally normal, with  no evidence of dilitation.   Venous: The inferior vena cava is dilated in size with less than 50%  respiratory variability, suggesting right atrial pressure of 15 mmHg.   IAS/Shunts: The interatrial septum was not well visualized.     LEFT VENTRICLE  PLAX 2D  LVIDd:         5.70 cm  LVIDs:         3.90 cm  LV PW:         1.00 cm  LV IVS:        1.00 cm  LVOT diam:     2.20 cm  LV SV:         124  LV SV Index:   61  LVOT Area:     3.80 cm    LV Volumes (MOD)  LV vol d, MOD A2C: 170.0 ml  LV vol d, MOD A4C: 200.0 ml  LV vol s, MOD A2C: 88.2 ml  LV vol s, MOD A4C: 82.0 ml  LV SV MOD A2C:     81.8 ml  LV SV MOD A4C:     200.0 ml  LV SV MOD BP:      103.6 ml   RIGHT VENTRICLE  RV Basal diam:  5.20 cm  RV Mid diam:    4.00 cm  RV S prime:     8.59 cm/s  TAPSE (M-mode): 1.7 cm   LEFT ATRIUM              Index        RIGHT ATRIUM           Index  LA diam:        4.90 cm  2.40 cm/m   RA Area:     26.30 cm  LA Vol (A2C):    86.1 ml  42.12 ml/m  RA Volume:   80.90 ml  39.58 ml/m  LA Vol (A4C):   98.2 ml  48.04 ml/m  LA Biplane Vol: 101.0 ml 49.41 ml/m   AORTIC VALVE                     PULMONIC VALVE  AV Area (Vmax):    1.33 cm      PV Vmax:        1.73 m/s  AV Area (Vmean):   1.37 cm      PV Vmean:       122.000 cm/s  AV Area (VTI):     1.39 cm      PV VTI:         0.382 m  AV Vmax:           362.00 cm/s   PV Peak grad:   12.0 mmHg  AV Vmean:          269.000  cm/s  PV Mean grad:   7.0 mmHg  AV VTI:            0.893 m       RVOT Peak grad: 1 mmHg  AV Peak Grad:      52.4 mmHg  AV Mean Grad:      34.0 mmHg  LVOT Vmax:         127.00 cm/s  LVOT Vmean:        97.000 cm/s  LVOT VTI:          0.326 m  LVOT/AV VTI ratio: 0.37    AORTA  Ao Root diam: 3.70 cm  Ao Asc diam:  3.40 cm   MITRAL VALVE                TRICUSPID VALVE  MV Area (PHT): 3.17 cm     TR Peak grad:   18.0 mmHg  MV Area VTI:   2.92 cm     TR Vmax:        212.00 cm/s  MV Peak grad:  11.6 mmHg  MV Mean grad:  6.0 mmHg     SHUNTS  MV Vmax:       1.70 m/s     Systemic VTI:  0.33 m  MV Vmean:      116.0 cm/s   Systemic Diam: 2.20 cm  MV Decel Time: 239 msec     Pulmonic VTI:  0.114 m  MV E velocity: 152.00 cm/s  MV A velocity: 98.80 cm/s  MV E/A ratio:  1.54   CATH (02/2022) Conclusion  1.  Multivessel coronary artery disease with moderate left main stenosis, total occlusion of the proximal LAD, patency of the left circumflex with mild nonobstructive disease, and moderate heavily calcified RCA stenosis involving a large, dominant vessel 2.  Severe aortic stenosis by echo assessment, cardiac catheterization demonstrating mean transvalvular gradient 27 mmHg 3.  Mildly elevated intracardiac diastolic filling pressures with right atrial pressure of 9, RV pressure 47/10, PA pressure 57/9 with a mean of 25, and LVEDP 14 mmHg.  Pulmonary capillary wedge pressure unable to accurately determine 4.  Status post aortocoronary bypass surgery with  continued patency of the LIMA to LAD and saphenous vein graft to first diagonal.  Chronic occlusion of the saphenous vein graft OM and saphenous vein graft to PDA.    Recent Radiology Findings:   CTA (02/2022)  FINDINGS: Image quality: Poor.  Contrast bolus in the subclavian vein.   Noise artifact is: Moderate.   Valve Morphology: Tricuspid aortic valve with diffuse severe calcifications. Restricted movement in systole.   Aortic Valve Calcium score: 2445   Aortic annular dimension:   Phase assessed: 20%   Annular area: 511 mm2   Annular perimeter: 82.1 mm   Max diameter: 29.2 mm   Min diameter: 23.2 mm   Annular and subannular calcification: Moderate single protruding calcification under the LCC, extending into the LVOT.   Membranous septum length: 10.8 mm   Optimal coplanar projection: LAO 0 CRA 0   Coronary Artery Height above Annulus:   Left Main: 15.3 mm   Right Coronary: 19.0 mm   Sinus of Valsalva Measurements:   Non-coronary: 35 mm   Right-coronary: 35 mm   Left-coronary: 35 mm   Sinus of Valsalva Height:   Non-coronary: 25.1 mm   Right-coronary: 25.3 mm   Left-coronary: 24.9 mm   Sinotubular Junction: 29 mm   Ascending Thoracic Aorta: 36.7 mm   Coronary Arteries: Normal coronary  origin. Right dominance. The study was performed without use of NTG and is insufficient for plaque evaluation. Native 3-vessel coronary calcifications. S/p CABG. Graft patency not assessed due to poor quality study. Please refer to recent cardiac catheterization for coronary assessment.   Cardiac Morphology:   Right Atrium: Right atrial size is within normal limits.   Right Ventricle: The right ventricular cavity is within normal limits.   Left Atrium: Left atrial size is normal in size. A left atrial appendage closure device is present with a small ~3 mm device leak.   Left Ventricle: The ventricular cavity size is within normal limits.   Pulmonary  arteries: Normal in size without proximal filling defect.   Pulmonary veins: Normal pulmonary venous drainage.   Pericardium: Normal thickness with no significant effusion or calcium present.   Mitral Valve: The mitral valve is normal structure with mild annular calcium.   Extra-cardiac findings: See attached radiology report for non-cardiac structures.   IMPRESSION: 1. Poor quality study but interpretable. Annular measurements favor a 26 mm Sapien 3 or 34 mm Evolut Pro.   2. Moderate single protruding calcification under the LCC, extending into the LVOT.   3. Sufficient coronary to annulus distance.   4. Optimal Fluoroscopic Angle for Delivery: LAO 0 CRA 0   5. LAA closure device present with evidence of ~3 mm device leak.   6. S/p CABG. Recent Lab Findings: Lab Results  Component Value Date   WBC 4.2 02/08/2022   HGB 10.9 (L) 02/17/2022   HCT 32.0 (L) 02/17/2022   PLT 86 (LL) 02/08/2022   GLUCOSE 114 (H) 02/08/2022   CHOL 118 02/12/2021   TRIG 66.0 02/12/2021   HDL 40.30 02/12/2021   LDLDIRECT 198.2 07/11/2009   LDLCALC 65 02/12/2021   ALT 16 01/19/2022   AST 27 01/19/2022   NA 142 02/17/2022   K 3.6 02/17/2022   CL 107 (H) 02/08/2022   CREATININE 0.88 02/08/2022   BUN 14 02/08/2022   CO2 19 (L) 02/08/2022   TSH 2.51 02/12/2021   INR 1.9 (H) 12/17/2021   HGBA1C 5.8 (A) 08/10/2021      Assessment / Plan:     72 yo with NYHA class 2 symptoms of moderate to severe AS. Pt with his age, frailty, previous CABG surgery, cirrhosis, thrombocytopenia, pulmonary fibrosis on home oxygen therapy, is not a surgical candidate and TAVR should be offered. He has appropriate anatomy for a Sapien 26 mm valve via the right femoral approach. He is at increased risk for CHB due to his underlying conduction abnormality and a temporary IJ pacer should be considered. He would not be a bailout candidate if needed at time of TAVR. I have discussed all the risks and benefits for  proceeding with TAVR and pt understands and wishes to proceed.    I have spent 60 min in review of the records, viewing studies and in face to face with patient and in coordination of future care    Coralie Common 02/28/2022 3:05 PM

## 2022-03-01 ENCOUNTER — Institutional Professional Consult (permissible substitution): Payer: 59 | Admitting: Thoracic Surgery (Cardiothoracic Vascular Surgery)

## 2022-03-01 VITALS — BP 129/67 | HR 72 | Resp 18 | Ht 66.0 in | Wt 212.0 lb

## 2022-03-01 DIAGNOSIS — I350 Nonrheumatic aortic (valve) stenosis: Secondary | ICD-10-CM | POA: Diagnosis not present

## 2022-03-01 NOTE — Patient Instructions (Signed)
TAVR procedure to be scheduled

## 2022-03-03 ENCOUNTER — Other Ambulatory Visit: Payer: Self-pay

## 2022-03-03 ENCOUNTER — Encounter: Payer: 59 | Admitting: Surgery

## 2022-03-03 DIAGNOSIS — I350 Nonrheumatic aortic (valve) stenosis: Secondary | ICD-10-CM

## 2022-03-10 NOTE — Progress Notes (Deleted)
HPI: FU CAD, AS and atrial flutter/fibrillation; history of coronary artery disease, status post coronary artery bypass graft surgery performed in June 2006. Patient had a LIMA to the LAD, saphenous vein graft to the diagonal, saphenous vein graft to the acute marginal and saphenous vein graft to the PDA. Carotid Dopplers August 2015 showed no significant stenosis. Nuclear study August 2015 showed ejection fraction 55% but no ischemia or infarction. Possible mild decrease in systolic blood pressure at peak exercise.  Patient also with h/o atrial fibrillation/atrial flutter. Monitor December 2021 showed sinus rhythm with rare PAC and PVC.  Abdominal ultrasound June 2022 showed no aneurysm.  EGD October 2022 showed 1 moderate varix. Abdominal ultrasound December 2022 showed changes of cirrhosis.  Echocardiogram repeated June 2023 and showed normal LV function, severe left ventricular enlargement, mild left ventricular hypertrophy, grade 2 diastolic dysfunction, mild right ventricular enlargement, moderate biatrial enlargement, mild mitral regurgitation, moderate aortic stenosis with mean gradient 32 mmHg, aortic valve area 1.4 cm and dimensionless index 0.34.  Calcium score July 2023 showed cirrhosis with splenomegaly and findings concerning for portal venous hypertension, possible interstitial lung disease, calcium score of aortic valve 2791.  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  CT for morphology prior to left atrial appendage occlusion October 2023 showed left lower lobe nodule and follow-up recommended 3 months.  Abdominal ultrasound December 2023 showed cirrhotic liver and dilated common bile duct.  CTA January 2024 showed cirrhotic liver and sequela of portal hypertension including splenomegaly and large splenorenal varices.  Since last seen   Current Outpatient  Medications  Medication Sig Dispense Refill   acetaminophen (TYLENOL) 500 MG tablet Take 1,000 mg by mouth daily as needed for moderate pain or headache.     atorvastatin (LIPITOR) 80 MG tablet TAKE 1 TABLET BY MOUTH EVERY DAY 90 tablet 1   carvedilol (COREG) 3.125 MG tablet TAKE 1 TABLET BY MOUTH TWICE A DAY WITH FOOD 60 tablet 5   clopidogrel (PLAVIX) 75 MG tablet Take 1 tablet (75 mg total) by mouth daily. START 02-01-2022 90 tablet 3   [employer] VITAMIN B12 1000 MCG tablet TAKE 1 TABLET BY MOUTH EVERY DAY 180 tablet 1   empagliflozin (JARDIANCE) 10 MG TABS tablet Take 1 tablet (10 mg total) by mouth daily before breakfast. 30 tablet 11   furosemide (LASIX) 40 MG tablet Take 2 tablets (80 mg total) by mouth 2 (two) times daily. 360 tablet 3   KLOR-CON M20 20 MEQ tablet TAKE 1 TABLET BY MOUTH EVERY DAY 30 tablet 11   Misc Natural Products (LIVER PROTECT PO) Take 1 capsule by mouth daily. From [employer]     Pirfenidone (ESBRIET) 267 MG TABS Take 3 tablets (801 mg total) by mouth 3 (three) times daily with meals. Month 2 and onwards (maintenance) 270 tablet 4   tamsulosin (FLOMAX) 0.4 MG CAPS capsule TAKE ONE CAPSULE BY MOUTH EVERY DAY AFTER SUPPER 90 capsule 1   No current facility-administered medications for this visit.     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 CBurt Knack  Sleep apnea    has a cpap - not using religiously    Past Surgical History:  Procedure Laterality Date    BIOPSY  11/18/2020   Procedure: BIOPSY;  Surgeon: AYetta Flock MD;  [facility];  Service: Gastroenterology;;   CARDIOVERSION N/A 12/14/2017   Procedure: CARDIOVERSION;  Surgeon: RSkeet Latch MD;  [facility]  Service: Cardiovascular;  Laterality: N/A;   CARDIOVERSION N/A 10/01/2019   Procedure: CARDIOVERSION;  Surgeon: CBuford Dresser MD;  [facility];  Service: Cardiovascular;  Laterality: N/A;   COLONOSCOPY     COLONOSCOPY WITH PROPOFOL N/A 11/18/2020   Procedure: COLONOSCOPY WITH PROPOFOL;  Surgeon: AYetta Flock MD;  Location: [facility];  Service: Gastroenterology;  Laterality: N/A;   CORONARY ARTERY BYPASS GRAFT  2006   ESOPHAGOGASTRODUODENOSCOPY (EGD) WITH PROPOFOL N/A 11/18/2020   Procedure: ESOPHAGOGASTRODUODENOSCOPY (EGD) WITH PROPOFOL;  Surgeon: AYetta Flock MD;  Location: [facility];  Service: Gastroenterology;  Laterality: N/A;   LEFT ATRIAL APPENDAGE OCCLUSION N/A 12/17/2021   Procedure: LEFT ATRIAL APPENDAGE OCCLUSION;  Surgeon: CSherren Mocha MD;  [facility];  Service: Cardiovascular;  Laterality: N/A;   POLYPECTOMY     POLYPECTOMY  11/18/2020   Procedure: POLYPECTOMY;  Surgeon: AYetta Flock MD;  Location: [facility];  Service: Gastroenterology;;   RIGHT/LEFT HEART CATH AND CORONARY/GRAFT ANGIOGRAPHY N/A 02/17/2022   Procedure: RIGHT/LEFT HEART CATH AND CORONARY/GRAFT ANGIOGRAPHY;  Surgeon: CSherren Mocha MD;  [facility];  Service: Cardiovascular;  Laterality: N/A;   TEE WITHOUT CARDIOVERSION N/A 12/17/2021   Procedure: TRANSESOPHAGEAL ECHOCARDIOGRAM (TEE);  Surgeon: CSherren Mocha MD;  [facility];  Service: Cardiovascular;  Laterality: N/A;    Social History   Socioeconomic History   Marital status: Significant Other    Spouse name: Not on file   Number of children: 0   Years of education: Not on file   Highest education  level: Not on file  Occupational History   Occupation: [employer] transport manager Tobacco Use   Smoking status: Never   Smokeless tobacco: Never  Vaping Use   Vaping Use: Never used  Substance and Sexual Activity   Alcohol use: Yes    Alcohol/week: 1.0 standard drink of alcohol    Types: 1 Cans of beer per week    Comment: occasional   Drug use: No   Sexual activity: Not on file  Other Topics Concern   Not on file  Social History Narrative   Not on file   Social Determinants of Health   Financial Resource Strain: Not on file  Food Insecurity: No Food Insecurity (12/17/2021)   Hunger Vital Sign    Worried About Running Out of Food in the Last Year: Never true    Ran Out of Food in the Last Year: Never true  Transportation Needs: No Transportation Needs (12/17/2021)   PRAPARE - THydrologist(Medical): No    Lack of Transportation (Non-Medical): No  Physical Activity: Not on file  Stress: Not on file  Social Connections: Not on file  Intimate Partner Violence: Not At Risk (12/17/2021)   Humiliation, Afraid, Rape, and Kick questionnaire    Fear of Current or Ex-Partner: No    Emotionally Abused: No  Physically Abused: No    Sexually Abused: No    Family History  Problem Relation Age of Onset   Hypertension Mother    Hyperlipidemia Mother    Heart disease Father    Ulcers Father    Bipolar disorder Brother    Colon cancer Neg Hx    Colon polyps Neg Hx    Esophageal cancer Neg Hx    Rectal cancer Neg Hx    Stomach cancer Neg Hx     ROS: no fevers or chills, productive cough, hemoptysis, dysphasia, odynophagia, melena, hematochezia, dysuria, hematuria, rash, seizure activity, orthopnea, PND, pedal edema, claudication. Remaining systems are negative.  Physical Exam: Well-developed well-nourished in no acute distress.  Skin is warm and dry.  HEENT is normal.  Neck is supple.  Chest is clear to auscultation with normal  expansion.  Cardiovascular exam is regular rate and rhythm.  Abdominal exam nontender or distended. No masses palpated. Extremities show no edema. neuro grossly intact  ECG- personally reviewed  A/P  1 aortic stenosis-Scheduled for TAVR.  2 paroxysmal afibrillation-patient is status post watchman and anticoagulation has been discontinued.  Continue Plavix for 6 months following watchman.  He remains in sinus rhythm on examination.  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  Will continue Jardiance and diuretic at present dose.  4 hypertension-patient's blood pressure is controlled.  Continue present medical regimen.  5 hyperlipidemia-continue statin.  6 coronary artery disease-continue statin and Plavix.  7 lung nodule-we will arrange follow-up noncontrast chest CT.  8 interstitial lung disease-Per pulmonary.  9 cirrhosis-managed by gastroenterology.  Kirk Ruths, MD

## 2022-03-12 ENCOUNTER — Ambulatory Visit (HOSPITAL_COMMUNITY)
Admission: RE | Admit: 2022-03-12 | Discharge: 2022-03-12 | Disposition: A | Discharge status: Home / Self Care | Payer: 59 | Source: Ambulatory Visit | Attending: Cardiovascular Disease | Admitting: Cardiovascular Disease

## 2022-03-12 ENCOUNTER — Encounter (HOSPITAL_COMMUNITY)
Admission: RE | Admit: 2022-03-12 | Discharge: 2022-03-12 | Disposition: A | Discharge status: Home / Self Care | Payer: 59 | Source: Ambulatory Visit | Attending: Cardiovascular Disease | Admitting: Cardiovascular Disease

## 2022-03-12 DIAGNOSIS — I350 Nonrheumatic aortic (valve) stenosis: Secondary | ICD-10-CM | POA: Diagnosis not present

## 2022-03-12 DIAGNOSIS — Z1152 Encounter for screening for COVID-19: Secondary | ICD-10-CM | POA: Diagnosis not present

## 2022-03-12 DIAGNOSIS — Z01818 Encounter for other preprocedural examination: Secondary | ICD-10-CM | POA: Insufficient documentation

## 2022-03-12 LAB — PROTIME-INR
INR: 1.7 — ABNORMAL HIGH (ref 0.8–1.2)
Prothrombin Time: 19.8 seconds — ABNORMAL HIGH (ref 11.4–15.2)

## 2022-03-12 LAB — COMPREHENSIVE METABOLIC PANEL
ALT: 19 U/L (ref 0–44)
AST: 29 U/L (ref 15–41)
Albumin: 2.8 g/dL — ABNORMAL LOW (ref 3.5–5.0)
Alkaline Phosphatase: 63 U/L (ref 38–126)
Anion gap: 10 (ref 5–15)
BUN: 14 mg/dL (ref 8–23)
CO2: 22 mmol/L (ref 22–32)
Calcium: 8.4 mg/dL — ABNORMAL LOW (ref 8.9–10.3)
Chloride: 106 mmol/L (ref 98–111)
Creatinine, Ser: 0.82 mg/dL (ref 0.61–1.24)
GFR, Estimated: >60 mL/min (ref >60)
Glucose, Bld: 135 mg/dL — ABNORMAL HIGH (ref 70–99)
Potassium: 3.4 mmol/L — ABNORMAL LOW (ref 3.5–5.1)
Sodium: 138 mmol/L (ref 135–145)
Total Bilirubin: 3 mg/dL — ABNORMAL HIGH (ref 0.3–1.2)
Total Protein: 6.7 g/dL (ref 6.5–8.1)

## 2022-03-12 LAB — CBC
HCT: 37.5 % — ABNORMAL LOW (ref 39.0–52.0)
Hemoglobin: 12.4 g/dL — ABNORMAL LOW (ref 13.0–17.0)
MCH: 31.6 pg (ref 26.0–34.0)
MCHC: 33.1 g/dL (ref 30.0–36.0)
MCV: 95.7 fL (ref 80.0–100.0)
Platelets: 86 10*3/uL — ABNORMAL LOW (ref 150–400)
RBC: 3.92 MIL/uL — ABNORMAL LOW (ref 4.22–5.81)
RDW: 17.7 % — ABNORMAL HIGH (ref 11.5–15.5)
WBC: 4.1 10*3/uL (ref 4.0–10.5)
nRBC: 0 % (ref 0.0–0.2)

## 2022-03-12 LAB — URINALYSIS, MICROSCOPIC (REFLEX)
RBC / HPF: NONE SEEN RBC/hpf (ref 0–5)
WBC, UA: NONE SEEN WBC/hpf (ref 0–5)

## 2022-03-12 LAB — URINALYSIS, ROUTINE W REFLEX MICROSCOPIC
Bilirubin Urine: NEGATIVE
Glucose, UA: >=500 mg/dL — AB
Hgb urine dipstick: NEGATIVE
Ketones, ur: NEGATIVE mg/dL
Leukocytes,Ua: NEGATIVE
Nitrite: NEGATIVE
Protein, ur: NEGATIVE mg/dL
Specific Gravity, Urine: 1.015 (ref 1.005–1.030)
pH: 5.5 (ref 5.0–8.0)

## 2022-03-12 LAB — SURGICAL PCR SCREEN
MRSA, PCR: NEGATIVE
Staphylococcus aureus: POSITIVE — AB

## 2022-03-12 LAB — TYPE AND SCREEN
ABO/RH(D): A POS
Antibody Screen: NEGATIVE

## 2022-03-12 LAB — SARS CORONAVIRUS 2 (TAT 6-24 HRS): SARS Coronavirus 2: NEGATIVE

## 2022-03-12 NOTE — Progress Notes (Signed)
[name] aware of abnormal lab values from todays visit.

## 2022-03-12 NOTE — Progress Notes (Signed)
Soap and instructions given to patient. All questions answered about the letter that was received from the office.

## 2022-03-15 ENCOUNTER — Ambulatory Visit: Payer: 59 | Admitting: Gastroenterology

## 2022-03-15 MED ORDER — CEFAZOLIN SODIUM-DEXTROSE 2-4 GM/100ML-% IV SOLN
2.0000 g | INTRAVENOUS | Status: AC
  Administered 2022-03-16: 2 g via INTRAVENOUS
  Filled 2022-03-15: qty 100

## 2022-03-15 MED ORDER — HEPARIN 30,000 UNITS/1000 ML (OHS) CELLSAVER SOLUTION
Status: DC
  Filled 2022-03-15: qty 1000

## 2022-03-15 MED ORDER — MAGNESIUM SULFATE 50 % IJ SOLN
40.0000 meq | INTRAMUSCULAR | Status: DC
  Filled 2022-03-15: qty 9.85

## 2022-03-15 MED ORDER — NOREPINEPHRINE 4 MG/250ML-% IV SOLN
0.0000 ug/min | INTRAVENOUS | Status: AC
  Administered 2022-03-16: 1 ug/min via INTRAVENOUS
  Filled 2022-03-15: qty 250

## 2022-03-15 MED ORDER — DEXMEDETOMIDINE HCL IN NACL 400 MCG/100ML IV SOLN
0.1000 ug/kg/h | INTRAVENOUS | Status: AC
  Administered 2022-03-16: 6 ug/kg/h via INTRAVENOUS
  Filled 2022-03-15 (×2): qty 100

## 2022-03-15 MED ORDER — POTASSIUM CHLORIDE 2 MEQ/ML IV SOLN
80.0000 meq | INTRAVENOUS | Status: DC
  Filled 2022-03-15: qty 40

## 2022-03-15 NOTE — H&P (Signed)
[name]       [name],[name] [id number]             [phone]                                   [name] [name] Date of Birth: Mar 02, 1950   Tony Perla, MD Tony Hau, MD   Chief Complaint: Dyspnea   History of Present Illness:     72 yo male who has had a previous CABG in 2006 and has been followed for moderate AS. Pt has been having increasing DOE over the past several months.He denies CP or light headedness or syncope. He has lower extremity edema if he fails to take his lasix.  He has also been diagnosed with nonalcoholic liver cirrhosis and splenomegaly and pulmonary fibrosis. He has chronic atrial fibrillation and is sp watchman device placement last year. He has been on home oxygen therapy at 2liters since the Delta procedure.  Due to increasing DOE, a repeat echo was performed demonstrating a bulky calcified aortic valve with a mean gradient of 26mHg and normal LV function. He had a very elevated calcium score of 2700. Pt was felt that due to his worsening symptoms and his underlying pulmonary fibrosis that the degree of aortic stenosis warented proceeding with AVR and cath and TAVR CTA were performed. Pt with ongoing patency of his LIMA and diag grafts but with chronic occlusion of his other SVGs.              Past Medical History:  Diagnosis Date   Adjustment disorder with depressed mood 09/07/2007   Cataract      removed bilaterally   Chronic kidney disease      kidney stones   Cirrhosis ([facility]     Colon polyps      Tubular Adenoma 2010   CONGESTIVE HEART FAILURE 12/09/2008   CORONARY ARTERY DISEASE 2006   HYPERLIPIDEMIA 08/09/2006   HYPERTENSION 08/09/2006   MYOCARDIAL INFARCTION, HX OF 07/07/2004   NEPHROLITHIASIS, HX OF 08/09/2006   OBESITY 07/19/2008   Presence of Watchman left atrial appendage closure device 12/17/2021    272mWatchman with Dr. CoBurt Knack Sleep apnea      has  a cpap - not using religiously           Past Surgical History:  Procedure Laterality Date   BIOPSY   11/18/2020    Procedure: BIOPSY;  Surgeon: ArYetta FlockMD;  [facility];  Service: Gastroenterology;;   CARDIOVERSION N/A 12/14/2017    Procedure: CARDIOVERSION;  Surgeon: RaSkeet LatchMD;  [facility] Service: Cardiovascular;  Laterality: N/A;   CARDIOVERSION N/A 10/01/2019    Procedure: CARDIOVERSION;  Surgeon: ChBuford DresserMD;  [facility] Service: Cardiovascular;  Laterality: N/A;   COLONOSCOPY       COLONOSCOPY WITH PROPOFOL N/A 11/18/2020    Procedure: COLONOSCOPY WITH PROPOFOL;  Surgeon: ArYetta FlockMD;  [facility];  Service: Gastroenterology;  Laterality: N/A;   CORONARY ARTERY BYPASS GRAFT   2006   ESOPHAGOGASTRODUODENOSCOPY (EGD) WITH PROPOFOL N/A 11/18/2020    Procedure: ESOPHAGOGASTRODUODENOSCOPY (EGD) WITH PROPOFOL;  Surgeon: ArYetta FlockMD;  [facility];  Service: Gastroenterology;  Laterality: N/A;   LEFT ATRIAL APPENDAGE OCCLUSION N/A 12/17/2021    Procedure: LEFT ATRIAL APPENDAGE OCCLUSION;  Surgeon: [name], MD;  Location: [facility];  Service: Cardiovascular;  Laterality: N/A;   POLYPECTOMY       POLYPECTOMY   11/18/2020    Procedure: POLYPECTOMY;  Surgeon: Yetta Flock, MD;  [facility]: [facility];  Service: Gastroenterology;;   RIGHT/LEFT HEART CATH AND CORONARY/GRAFT ANGIOGRAPHY N/A 02/17/2022    Procedure: RIGHT/LEFT HEART CATH AND CORONARY/GRAFT ANGIOGRAPHY;  Surgeon: [name], MD;  Location: [facility];  Service: Cardiovascular;  Laterality: N/A;   TEE WITHOUT CARDIOVERSION N/A 12/17/2021    Procedure: TRANSESOPHAGEAL ECHOCARDIOGRAM (TEE);  Surgeon: [name], MD;  Location: [facility];  Service: Cardiovascular;  Laterality: N/A;      Social History       Tobacco Use  Smoking Status Never  Smokeless  Tobacco Never    Social History        Substance and Sexual Activity  Alcohol Use Yes   Alcohol/week: 1.0 standard drink of alcohol   Types: 1 Cans of beer per week    Comment: occasional      Social History         Socioeconomic History   Marital status: Significant Other      Spouse name: Not on file   Number of children: 0   Years of education: Not on file   Highest education level: Not on file  Occupational History   Occupation: Passenger transport manager  Tobacco Use   Smoking status: Never   Smokeless tobacco: Never  Vaping Use   Vaping Use: Never used  Substance and Sexual Activity   Alcohol use: Yes      Alcohol/week: 1.0 standard drink of alcohol      Types: 1 Cans of beer per week      Comment: occasional   Drug use: No   Sexual activity: Not on file  Other Topics Concern   Not on file  Social History Narrative   Not on file    Social Determinants of Health        Financial Resource Strain: Not on file  Food Insecurity: No Food Insecurity (12/17/2021)    Hunger Vital Sign     Worried About Running Out of Food in the Last Year: Never true     Ran Out of Food in the Last Year: Never true  Transportation Needs: No Transportation Needs (12/17/2021)    PRAPARE - Armed forces logistics/support/administrative officer (Medical): No     Lack of Transportation (Non-Medical): No  Physical Activity: Not on file  Stress: Not on file  Social Connections: Not on file  Intimate Partner Violence: Not At Risk (12/17/2021)    Humiliation, Afraid, Rape, and Kick questionnaire     Fear of Current or Ex-Partner: No     Emotionally Abused: No     Physically Abused: No     Sexually Abused: No      No Known Allergies         Current Outpatient Medications  Medication Sig Dispense Refill   acetaminophen (TYLENOL) 500 MG tablet Take 1,000 mg by mouth daily as needed for moderate pain or headache.       aspirin EC 81 MG tablet Take 81 mg by mouth daily. START 02-01-2022        atorvastatin (LIPITOR) 80 MG tablet TAKE 1 TABLET BY MOUTH EVERY DAY 90 tablet 1   carvedilol (COREG) 3.125 MG tablet TAKE 1 TABLET BY MOUTH TWICE A DAY WITH FOOD 60  tablet 5   clopidogrel (PLAVIX) 75 MG tablet Take 1 tablet (75 mg total) by mouth daily. START 02-01-2022 90 tablet 3   [employer] VITAMIN B12 1000 MCG tablet TAKE 1 TABLET BY MOUTH EVERY DAY 180 tablet 1   empagliflozin (JARDIANCE) 10 MG TABS tablet Take 1 tablet (10 mg total) by mouth daily before breakfast. 30 tablet 11   furosemide (LASIX) 40 MG tablet Take 2 tablets (80 mg total) by mouth 2 (two) times daily. 360 tablet 3   KLOR-CON M20 20 MEQ tablet TAKE 1 TABLET BY MOUTH EVERY DAY 30 tablet 11   Misc Natural Products (LIVER PROTECT PO) Take 1 capsule by mouth daily. From [employer]       Pirfenidone (ESBRIET) 267 MG TABS Take 3 tablets (801 mg total) by mouth 3 (three) times daily with meals. Month 2 and onwards (maintenance) 270 tablet 4   tamsulosin (FLOMAX) 0.4 MG CAPS capsule TAKE ONE CAPSULE BY MOUTH EVERY DAY AFTER SUPPER 90 capsule 1    No current facility-administered medications for this visit.             Family History  Problem Relation Age of Onset   Hypertension Mother     Hyperlipidemia Mother     Heart disease Father     Ulcers Father     Bipolar disorder Brother     Colon cancer Neg Hx     Colon polyps Neg Hx     Esophageal cancer Neg Hx     Rectal cancer Neg Hx     Stomach cancer Neg Hx              Physical Exam:           Diagnostic Studies & Laboratory data: I have personally reviewed the following studies and agree with the findings   TTE (01/2022) IMPRESSIONS     1. Left ventricular ejection fraction, by estimation, is 55 to 60%. The  left ventricle has normal function. The left ventricle demonstrates  regional wall motion abnormalities (see scoring diagram/findings for  description). The left ventricular internal  cavity size was moderately dilated. Left ventricular diastolic  function  could not be evaluated. There is hypokinesis of the left ventricular,  apical apical segment.   2. Right ventricular systolic function is normal. The right ventricular  size is mildly enlarged. There is normal pulmonary artery systolic  pressure.   3. Left atrial size was moderately dilated.   4. Right atrial size was moderately dilated.   5. The mitral valve is grossly normal. Trivial mitral valve  regurgitation. No evidence of mitral stenosis.   6. The aortic valve is calcified. There is moderate calcification of the  aortic valve. There is moderate thickening of the aortic valve. Aortic  valve regurgitation is not visualized. Moderate aortic valve stenosis.  Aortic valve area, by VTI measures  1.39 cm. Aortic valve mean gradient measures 34.0 mmHg. Aortic valve Vmax  measures 3.62 m/s.   7. The inferior vena cava is dilated in size with <50% respiratory  variability, suggesting right atrial pressure of 15 mmHg.   Comparison(s): No significant change from prior study.   Conclusion(s)/Recommendation(s): Moderate AS, similar to prior. Mean  gradient 34, peak gradient 52, peak velocity 3.62, DI 0.37, AVA 1.37.   FINDINGS   Left Ventricle: Left ventricular ejection fraction, by estimation, is 55  to 60%. The left ventricle has normal function. The left ventricle  demonstrates regional wall motion abnormalities. The left ventricular  internal cavity size was moderately  dilated. There is no left ventricular hypertrophy. Left ventricular  diastolic function could not be evaluated.   Right Ventricle: The right ventricular size is mildly enlarged. Right  vetricular wall thickness was not well visualized. Right ventricular  systolic function is normal. There is normal pulmonary artery systolic  pressure. The tricuspid regurgitant velocity   is 2.12 m/s, and with an assumed right atrial pressure of 15 mmHg, the  estimated right ventricular systolic pressure is 123456 mmHg.    Left Atrium: Left atrial size was moderately dilated.   Right Atrium: Right atrial size was moderately dilated.   Pericardium: There is no evidence of pericardial effusion.   Mitral Valve: The mitral valve is grossly normal. Trivial mitral valve  regurgitation. No evidence of mitral valve stenosis. MV peak gradient,  11.6 mmHg. The mean mitral valve gradient is 6.0 mmHg.   Tricuspid Valve: The tricuspid valve is grossly normal. Tricuspid valve  regurgitation is trivial. No evidence of tricuspid stenosis.   Aortic Valve: The aortic valve is calcified. There is moderate  calcification of the aortic valve. There is moderate thickening of the  aortic valve. Aortic valve regurgitation is not visualized. Moderate  aortic stenosis is present. Aortic valve mean  gradient measures 34.0 mmHg. Aortic valve peak gradient measures 52.4  mmHg. Aortic valve area, by VTI measures 1.39 cm.   Pulmonic Valve: The pulmonic valve was not well visualized. Pulmonic valve  regurgitation is not visualized. No evidence of pulmonic stenosis.   Aorta: The aortic root and ascending aorta are structurally normal, with  no evidence of dilitation.   Venous: The inferior vena cava is dilated in size with less than 50%  respiratory variability, suggesting right atrial pressure of 15 mmHg.   IAS/Shunts: The interatrial septum was not well visualized.     LEFT VENTRICLE  PLAX 2D  LVIDd:         5.70 cm  LVIDs:         3.90 cm  LV PW:         1.00 cm  LV IVS:        1.00 cm  LVOT diam:     2.20 cm  LV SV:         124  LV SV Index:   61  LVOT Area:     3.80 cm    LV Volumes (MOD)  LV vol d, MOD A2C: 170.0 ml  LV vol d, MOD A4C: 200.0 ml  LV vol s, MOD A2C: 88.2 ml  LV vol s, MOD A4C: 82.0 ml  LV SV MOD A2C:     81.8 ml  LV SV MOD A4C:     200.0 ml  LV SV MOD BP:      103.6 ml   RIGHT VENTRICLE  RV Basal diam:  5.20 cm  RV Mid diam:    4.00 cm  RV S prime:     8.59 cm/s  TAPSE (M-mode): 1.7 cm    LEFT ATRIUM              Index        RIGHT ATRIUM           Index  LA diam:        4.90 cm  2.40 cm/m   RA Area:     26.30 cm  LA Vol (A2C):   86.1 ml  42.12 ml/m  RA Volume:   80.90 ml  39.58 ml/m  LA Vol (A4C):   98.2 ml  48.04 ml/m  LA Biplane Vol: 101.0 ml 49.41 ml/m   AORTIC VALVE                     PULMONIC VALVE  AV Area (Vmax):    1.33 cm      PV Vmax:        1.73 m/s  AV Area (Vmean):   1.37 cm      PV Vmean:       122.000 cm/s  AV Area (VTI):     1.39 cm      PV VTI:         0.382 m  AV Vmax:           362.00 cm/s   PV Peak grad:   12.0 mmHg  AV Vmean:          269.000 cm/s  PV Mean grad:   7.0 mmHg  AV VTI:            0.893 m       RVOT Peak grad: 1 mmHg  AV Peak Grad:      52.4 mmHg  AV Mean Grad:      34.0 mmHg  LVOT Vmax:         127.00 cm/s  LVOT Vmean:        97.000 cm/s  LVOT VTI:          0.326 m  LVOT/AV VTI ratio: 0.37    AORTA  Ao Root diam: 3.70 cm  Ao Asc diam:  3.40 cm   MITRAL VALVE                TRICUSPID VALVE  MV Area (PHT): 3.17 cm     TR Peak grad:   18.0 mmHg  MV Area VTI:   2.92 cm     TR Vmax:        212.00 cm/s  MV Peak grad:  11.6 mmHg  MV Mean grad:  6.0 mmHg     SHUNTS  MV Vmax:       1.70 m/s     Systemic VTI:  0.33 m  MV Vmean:      116.0 cm/s   Systemic Diam: 2.20 cm  MV Decel Time: 239 msec     Pulmonic VTI:  0.114 m  MV E velocity: 152.00 cm/s  MV A velocity: 98.80 cm/s  MV E/A ratio:  1.54    CATH (02/2022) Conclusion   1.  Multivessel coronary artery disease with moderate left main stenosis, total occlusion of the proximal LAD, patency of the left circumflex with mild nonobstructive disease, and moderate heavily calcified RCA stenosis involving a large, dominant vessel 2.  Severe aortic stenosis by echo assessment, cardiac catheterization demonstrating mean transvalvular gradient 27 mmHg 3.  Mildly elevated intracardiac diastolic filling pressures with right atrial pressure of 9, RV pressure 47/10, PA pressure 57/9  with a mean of 25, and LVEDP 14 mmHg.  Pulmonary capillary wedge pressure unable to accurately determine 4.  Status post aortocoronary bypass surgery with continued patency of the LIMA to LAD and saphenous vein graft to first diagonal.  Chronic occlusion of the saphenous vein graft OM and saphenous vein graft to PDA.    Recent Radiology Findings:   CTA (02/2022)   FINDINGS: Image quality: Poor.  Contrast bolus in the subclavian vein.   Noise artifact is: Moderate.   Valve Morphology: Tricuspid aortic valve with diffuse severe  calcifications. Restricted movement in systole.   Aortic Valve Calcium score: 2445   Aortic annular dimension:   Phase assessed: 20%   Annular area: 511 mm2   Annular perimeter: 82.1 mm   Max diameter: 29.2 mm   Min diameter: 23.2 mm   Annular and subannular calcification: Moderate single protruding calcification under the LCC, extending into the LVOT.   Membranous septum length: 10.8 mm   Optimal coplanar projection: LAO 0 CRA 0   Coronary Artery Height above Annulus:   Left Main: 15.3 mm   Right Coronary: 19.0 mm   Sinus of Valsalva Measurements:   Non-coronary: 35 mm   Right-coronary: 35 mm   Left-coronary: 35 mm   Sinus of Valsalva Height:   Non-coronary: 25.1 mm   Right-coronary: 25.3 mm   Left-coronary: 24.9 mm   Sinotubular Junction: 29 mm   Ascending Thoracic Aorta: 36.7 mm   Coronary Arteries: Normal coronary origin. Right dominance. The study was performed without use of NTG and is insufficient for plaque evaluation. Native 3-vessel coronary calcifications. S/p CABG. Graft patency not assessed due to poor quality study. Please refer to recent cardiac catheterization for coronary assessment.   Cardiac Morphology:   Right Atrium: Right atrial size is within normal limits.   Right Ventricle: The right ventricular cavity is within normal limits.   Left Atrium: Left atrial size is normal in size. A left  atrial appendage closure device is present with a small ~3 mm device leak.   Left Ventricle: The ventricular cavity size is within normal limits.   Pulmonary arteries: Normal in size without proximal filling defect.   Pulmonary veins: Normal pulmonary venous drainage.   Pericardium: Normal thickness with no significant effusion or calcium present.   Mitral Valve: The mitral valve is normal structure with mild annular calcium.   Extra-cardiac findings: See attached radiology report for non-cardiac structures.   IMPRESSION: 1. Poor quality study but interpretable. Annular measurements favor a 26 mm Sapien 3 or 34 mm Evolut Pro.   2. Moderate single protruding calcification under the LCC, extending into the LVOT.   3. Sufficient coronary to annulus distance.   4. Optimal Fluoroscopic Angle for Delivery: LAO 0 CRA 0   5. LAA closure device present with evidence of ~3 mm device leak.   6. S/p CABG. Recent Lab Findings: Recent Labs       Lab Results  Component Value Date    WBC 4.2 02/08/2022    HGB 10.9 (L) 02/17/2022    HCT 32.0 (L) 02/17/2022    PLT 86 (LL) 02/08/2022    GLUCOSE 114 (H) 02/08/2022    CHOL 118 02/12/2021    TRIG 66.0 02/12/2021    HDL 40.30 02/12/2021    LDLDIRECT 198.2 07/11/2009    LDLCALC 65 02/12/2021    ALT 16 01/19/2022    AST 27 01/19/2022    NA 142 02/17/2022    K 3.6 02/17/2022    CL 107 (H) 02/08/2022    CREATININE 0.88 02/08/2022    BUN 14 02/08/2022    CO2 19 (L) 02/08/2022    TSH 2.51 02/12/2021    INR 1.9 (H) 12/17/2021    HGBA1C 5.8 (A) 08/10/2021            Assessment / Plan:     72 yo with NYHA class 2 symptoms of moderate to severe AS. Pt with his age, frailty, previous CABG surgery, cirrhosis, thrombocytopenia, pulmonary fibrosis on home oxygen therapy, is not a surgical candidate and  TAVR should be offered. He has appropriate anatomy for a Sapien 26 mm valve via the right femoral approach. He is at increased risk for  CHB due to his underlying conduction abnormality and a temporary IJ pacer should be considered. He would not be a bailout candidate if needed at time of TAVR. I have discussed all the risks and benefits for proceeding with TAVR and pt understands and wishes to proceed.

## 2022-03-16 ENCOUNTER — Inpatient Hospital Stay (HOSPITAL_COMMUNITY)
Admission: RE | Admit: 2022-03-16 | Discharge: 2022-03-21 | DRG: 266 | Disposition: A | Discharge status: Home / Self Care | Payer: 59 | Attending: Cardiovascular Disease | Admitting: Cardiovascular Disease

## 2022-03-16 ENCOUNTER — Inpatient Hospital Stay (HOSPITAL_COMMUNITY): Payer: 59 | Admitting: Physician Assistant

## 2022-03-16 ENCOUNTER — Inpatient Hospital Stay (HOSPITAL_COMMUNITY): Payer: 59 | Admitting: Anesthesiology

## 2022-03-16 ENCOUNTER — Other Ambulatory Visit: Payer: Self-pay

## 2022-03-16 ENCOUNTER — Inpatient Hospital Stay (HOSPITAL_COMMUNITY): Payer: 59

## 2022-03-16 ENCOUNTER — Encounter (HOSPITAL_COMMUNITY): Payer: Self-pay | Admitting: Cardiovascular Disease

## 2022-03-16 ENCOUNTER — Other Ambulatory Visit: Payer: Self-pay | Admitting: Cardiology

## 2022-03-16 ENCOUNTER — Encounter (HOSPITAL_COMMUNITY)
Admission: RE | Disposition: A | Discharge status: Home / Self Care | Payer: Self-pay | Source: Home / Self Care | Attending: Cardiovascular Disease

## 2022-03-16 ENCOUNTER — Other Ambulatory Visit (HOSPITAL_COMMUNITY): Payer: Self-pay | Admitting: *Deleted

## 2022-03-16 DIAGNOSIS — Z79899 Other long term (current) drug therapy: Secondary | ICD-10-CM

## 2022-03-16 DIAGNOSIS — E669 Obesity, unspecified: Secondary | ICD-10-CM | POA: Diagnosis present

## 2022-03-16 DIAGNOSIS — Z952 Presence of prosthetic heart valve: Secondary | ICD-10-CM

## 2022-03-16 DIAGNOSIS — Z7982 Long term (current) use of aspirin: Secondary | ICD-10-CM

## 2022-03-16 DIAGNOSIS — I2510 Atherosclerotic heart disease of native coronary artery without angina pectoris: Secondary | ICD-10-CM | POA: Diagnosis present

## 2022-03-16 DIAGNOSIS — G4733 Obstructive sleep apnea (adult) (pediatric): Secondary | ICD-10-CM | POA: Diagnosis present

## 2022-03-16 DIAGNOSIS — I484 Atypical atrial flutter: Secondary | ICD-10-CM | POA: Diagnosis present

## 2022-03-16 DIAGNOSIS — I130 Hypertensive heart and chronic kidney disease with heart failure and stage 1 through stage 4 chronic kidney disease, or unspecified chronic kidney disease: Secondary | ICD-10-CM | POA: Diagnosis present

## 2022-03-16 DIAGNOSIS — I5023 Acute on chronic systolic (congestive) heart failure: Secondary | ICD-10-CM | POA: Diagnosis not present

## 2022-03-16 DIAGNOSIS — Z6833 Body mass index (BMI) 33.0-33.9, adult: Secondary | ICD-10-CM

## 2022-03-16 DIAGNOSIS — Z006 Encounter for examination for normal comparison and control in clinical research program: Secondary | ICD-10-CM

## 2022-03-16 DIAGNOSIS — I25810 Atherosclerosis of coronary artery bypass graft(s) without angina pectoris: Secondary | ICD-10-CM | POA: Diagnosis present

## 2022-03-16 DIAGNOSIS — I350 Nonrheumatic aortic (valve) stenosis: Secondary | ICD-10-CM

## 2022-03-16 DIAGNOSIS — Z7984 Long term (current) use of oral hypoglycemic drugs: Secondary | ICD-10-CM

## 2022-03-16 DIAGNOSIS — Z83438 Family history of other disorder of lipoprotein metabolism and other lipidemia: Secondary | ICD-10-CM

## 2022-03-16 DIAGNOSIS — I482 Chronic atrial fibrillation, unspecified: Secondary | ICD-10-CM | POA: Diagnosis present

## 2022-03-16 DIAGNOSIS — E785 Hyperlipidemia, unspecified: Secondary | ICD-10-CM | POA: Diagnosis present

## 2022-03-16 DIAGNOSIS — N182 Chronic kidney disease, stage 2 (mild): Secondary | ICD-10-CM | POA: Diagnosis present

## 2022-03-16 DIAGNOSIS — K7460 Unspecified cirrhosis of liver: Secondary | ICD-10-CM | POA: Diagnosis present

## 2022-03-16 DIAGNOSIS — I10 Essential (primary) hypertension: Secondary | ICD-10-CM | POA: Diagnosis present

## 2022-03-16 DIAGNOSIS — D696 Thrombocytopenia, unspecified: Secondary | ICD-10-CM | POA: Diagnosis present

## 2022-03-16 DIAGNOSIS — I2582 Chronic total occlusion of coronary artery: Secondary | ICD-10-CM | POA: Diagnosis present

## 2022-03-16 DIAGNOSIS — Z8719 Personal history of other diseases of the digestive system: Secondary | ICD-10-CM

## 2022-03-16 DIAGNOSIS — K766 Portal hypertension: Secondary | ICD-10-CM | POA: Diagnosis present

## 2022-03-16 DIAGNOSIS — I110 Hypertensive heart disease with heart failure: Secondary | ICD-10-CM

## 2022-03-16 DIAGNOSIS — Z91199 Patient's noncompliance with other medical treatment and regimen due to unspecified reason: Secondary | ICD-10-CM

## 2022-03-16 DIAGNOSIS — J849 Interstitial pulmonary disease, unspecified: Secondary | ICD-10-CM | POA: Insufficient documentation

## 2022-03-16 DIAGNOSIS — I509 Heart failure, unspecified: Secondary | ICD-10-CM | POA: Diagnosis not present

## 2022-03-16 DIAGNOSIS — K7581 Nonalcoholic steatohepatitis (NASH): Secondary | ICD-10-CM | POA: Diagnosis present

## 2022-03-16 DIAGNOSIS — I5042 Chronic combined systolic (congestive) and diastolic (congestive) heart failure: Secondary | ICD-10-CM | POA: Diagnosis present

## 2022-03-16 DIAGNOSIS — I252 Old myocardial infarction: Secondary | ICD-10-CM | POA: Diagnosis not present

## 2022-03-16 DIAGNOSIS — I5043 Acute on chronic combined systolic (congestive) and diastolic (congestive) heart failure: Secondary | ICD-10-CM | POA: Diagnosis present

## 2022-03-16 DIAGNOSIS — Z8249 Family history of ischemic heart disease and other diseases of the circulatory system: Secondary | ICD-10-CM

## 2022-03-16 DIAGNOSIS — J9622 Acute and chronic respiratory failure with hypercapnia: Secondary | ICD-10-CM | POA: Diagnosis present

## 2022-03-16 DIAGNOSIS — J9611 Chronic respiratory failure with hypoxia: Secondary | ICD-10-CM | POA: Insufficient documentation

## 2022-03-16 DIAGNOSIS — I480 Paroxysmal atrial fibrillation: Secondary | ICD-10-CM | POA: Diagnosis present

## 2022-03-16 DIAGNOSIS — I4892 Unspecified atrial flutter: Secondary | ICD-10-CM | POA: Diagnosis present

## 2022-03-16 DIAGNOSIS — J9621 Acute and chronic respiratory failure with hypoxia: Secondary | ICD-10-CM | POA: Diagnosis present

## 2022-03-16 DIAGNOSIS — Z87442 Personal history of urinary calculi: Secondary | ICD-10-CM

## 2022-03-16 DIAGNOSIS — J84112 Idiopathic pulmonary fibrosis: Secondary | ICD-10-CM | POA: Diagnosis present

## 2022-03-16 DIAGNOSIS — Z95818 Presence of other cardiac implants and grafts: Secondary | ICD-10-CM

## 2022-03-16 DIAGNOSIS — Z9981 Dependence on supplemental oxygen: Secondary | ICD-10-CM

## 2022-03-16 HISTORY — PX: TRANSCATHETER AORTIC VALVE REPLACEMENT, TRANSFEMORAL: SHX6400

## 2022-03-16 HISTORY — DX: Presence of prosthetic heart valve: Z95.2

## 2022-03-16 HISTORY — PX: INTRAOPERATIVE TRANSTHORACIC ECHOCARDIOGRAM: SHX6523

## 2022-03-16 LAB — ECHOCARDIOGRAM LIMITED
AR max vel: 3.2 cm2
AV Area VTI: 3.05 cm2
AV Area mean vel: 3.12 cm2
AV Mean grad: 11 mmHg
AV Peak grad: 19.7 mmHg
Ao pk vel: 2.22 m/s
Calc EF: 57.4 %
S' Lateral: 3.9 cm
Single Plane A2C EF: 52.1 %
Single Plane A4C EF: 59.4 %

## 2022-03-16 LAB — CBC
HCT: 34.2 % — ABNORMAL LOW (ref 39.0–52.0)
Hemoglobin: 11.2 g/dL — ABNORMAL LOW (ref 13.0–17.0)
MCH: 31.3 pg (ref 26.0–34.0)
MCHC: 32.7 g/dL (ref 30.0–36.0)
MCV: 95.5 fL (ref 80.0–100.0)
Platelets: 55 10*3/uL — ABNORMAL LOW (ref 150–400)
RBC: 3.58 MIL/uL — ABNORMAL LOW (ref 4.22–5.81)
RDW: 17.3 % — ABNORMAL HIGH (ref 11.5–15.5)
WBC: 3.8 10*3/uL — ABNORMAL LOW (ref 4.0–10.5)
nRBC: 0 % (ref 0.0–0.2)

## 2022-03-16 LAB — POCT I-STAT, CHEM 8
BUN: 13 mg/dL (ref 8–23)
BUN: 14 mg/dL (ref 8–23)
Calcium, Ion: 1.19 mmol/L (ref 1.15–1.40)
Calcium, Ion: 1.23 mmol/L (ref 1.15–1.40)
Chloride: 109 mmol/L (ref 98–111)
Chloride: 111 mmol/L (ref 98–111)
Creatinine, Ser: 0.8 mg/dL (ref 0.61–1.24)
Creatinine, Ser: 0.7 mg/dL (ref 0.61–1.24)
Glucose, Bld: 126 mg/dL — ABNORMAL HIGH (ref 70–99)
Glucose, Bld: 113 mg/dL — ABNORMAL HIGH (ref 70–99)
HCT: 33 % — ABNORMAL LOW (ref 39.0–52.0)
HCT: 33 % — ABNORMAL LOW (ref 39.0–52.0)
Hemoglobin: 11.2 g/dL — ABNORMAL LOW (ref 13.0–17.0)
Hemoglobin: 11.2 g/dL — ABNORMAL LOW (ref 13.0–17.0)
Potassium: 4 mmol/L (ref 3.5–5.1)
Potassium: 4.4 mmol/L (ref 3.5–5.1)
Sodium: 142 mmol/L (ref 135–145)
Sodium: 142 mmol/L (ref 135–145)
TCO2: 20 mmol/L — ABNORMAL LOW (ref 22–32)
TCO2: 19 mmol/L — ABNORMAL LOW (ref 22–32)

## 2022-03-16 LAB — POCT I-STAT 7, (LYTES, BLD GAS, ICA,H+H)
Acid-base deficit: 3 mmol/L — ABNORMAL HIGH (ref 0.0–2.0)
Bicarbonate: 21.4 mmol/L (ref 20.0–28.0)
Calcium, Ion: 1.26 mmol/L (ref 1.15–1.40)
HCT: 32 % — ABNORMAL LOW (ref 39.0–52.0)
Hemoglobin: 10.9 g/dL — ABNORMAL LOW (ref 13.0–17.0)
O2 Saturation: 89 %
Potassium: 4 mmol/L (ref 3.5–5.1)
Sodium: 143 mmol/L (ref 135–145)
TCO2: 22 mmol/L (ref 22–32)
pCO2 arterial: 36.7 mmHg (ref 32–48)
pH, Arterial: 7.373 (ref 7.35–7.45)
pO2, Arterial: 58 mmHg — ABNORMAL LOW (ref 83–108)

## 2022-03-16 SURGERY — IMPLANTATION, AORTIC VALVE, TRANSCATHETER, FEMORAL APPROACH
Anesthesia: Monitor Anesthesia Care | Laterality: Right

## 2022-03-16 MED ORDER — HEPARIN (PORCINE) IN NACL 1000-0.9 UT/500ML-% IV SOLN
INTRAVENOUS | Status: DC | PRN
  Administered 2022-03-16 (×2): 500 mL

## 2022-03-16 MED ORDER — LACTATED RINGERS IV SOLN: INTRAVENOUS | Status: DC

## 2022-03-16 MED ORDER — ORAL CARE MOUTH RINSE: 15.0000 mL | Freq: Once | OROMUCOSAL | Status: AC

## 2022-03-16 MED ORDER — MORPHINE SULFATE (PF) 2 MG/ML IV SOLN: 1.0000 mg | INTRAVENOUS | Status: DC | PRN

## 2022-03-16 MED ORDER — PROPOFOL 500 MG/50ML IV EMUL
INTRAVENOUS | Status: DC | PRN
  Administered 2022-03-16: 20 mg via INTRAVENOUS
  Administered 2022-03-16: 25 ug/kg/min via INTRAVENOUS
  Administered 2022-03-16: 20 mg via INTRAVENOUS

## 2022-03-16 MED ORDER — ACETAMINOPHEN 325 MG PO TABS
650.0000 mg | ORAL_TABLET | Freq: Four times a day (QID) | ORAL | Status: DC | PRN
  Administered 2022-03-16 – 2022-03-20 (×4): 650 mg via ORAL
  Filled 2022-03-16 (×4): qty 2

## 2022-03-16 MED ORDER — SODIUM CHLORIDE 0.9 % IV SOLN: INTRAVENOUS | Status: DC

## 2022-03-16 MED ORDER — IOHEXOL 350 MG/ML SOLN
INTRAVENOUS | Status: DC | PRN
  Administered 2022-03-16: 60 mL

## 2022-03-16 MED ORDER — TRAMADOL HCL 50 MG PO TABS: 50.0000 mg | ORAL_TABLET | ORAL | Status: DC | PRN

## 2022-03-16 MED ORDER — CLOPIDOGREL BISULFATE 75 MG PO TABS
75.0000 mg | ORAL_TABLET | Freq: Every day | ORAL | Status: DC
  Administered 2022-03-17 – 2022-03-19 (×3): 75 mg via ORAL
  Filled 2022-03-16 (×3): qty 1

## 2022-03-16 MED ORDER — SODIUM CHLORIDE 0.9% FLUSH
3.0000 mL | Freq: Two times a day (BID) | INTRAVENOUS | Status: DC
  Administered 2022-03-17 – 2022-03-20 (×9): 3 mL via INTRAVENOUS

## 2022-03-16 MED ORDER — LIDOCAINE HCL (PF) 1 % IJ SOLN
INTRAMUSCULAR | Status: DC | PRN
  Administered 2022-03-16: 5 mL
  Administered 2022-03-16 (×2): 10 mL

## 2022-03-16 MED ORDER — PROTAMINE SULFATE 10 MG/ML IV SOLN
INTRAVENOUS | Status: DC | PRN
  Administered 2022-03-16: 10 mg via INTRAVENOUS
  Administered 2022-03-16: 130 mg via INTRAVENOUS

## 2022-03-16 MED ORDER — HEPARIN SODIUM (PORCINE) 1000 UNIT/ML IJ SOLN
INTRAMUSCULAR | Status: DC | PRN
  Administered 2022-03-16: 14000 [IU] via INTRAVENOUS

## 2022-03-16 MED ORDER — NITROGLYCERIN IN D5W 200-5 MCG/ML-% IV SOLN
INTRAVENOUS | Status: AC
  Filled 2022-03-16: qty 250

## 2022-03-16 MED ORDER — CHLORHEXIDINE GLUCONATE 4 % EX LIQD
30.0000 mL | CUTANEOUS | Status: DC
  Filled 2022-03-16: qty 30

## 2022-03-16 MED ORDER — CHLORHEXIDINE GLUCONATE 0.12 % MT SOLN: 15.0000 mL | Freq: Once | OROMUCOSAL | Status: DC

## 2022-03-16 MED ORDER — CEFAZOLIN SODIUM-DEXTROSE 2-4 GM/100ML-% IV SOLN
2.0000 g | Freq: Three times a day (TID) | INTRAVENOUS | Status: AC
  Administered 2022-03-16 – 2022-03-17 (×2): 2 g via INTRAVENOUS
  Filled 2022-03-16 (×2): qty 100

## 2022-03-16 MED ORDER — CHLORHEXIDINE GLUCONATE 4 % EX LIQD: 60.0000 mL | Freq: Once | CUTANEOUS | Status: DC

## 2022-03-16 MED ORDER — CHLORHEXIDINE GLUCONATE 0.12 % MT SOLN
15.0000 mL | Freq: Once | OROMUCOSAL | Status: AC
  Administered 2022-03-16: 15 mL via OROMUCOSAL
  Filled 2022-03-16: qty 15

## 2022-03-16 MED ORDER — NITROGLYCERIN IN D5W 200-5 MCG/ML-% IV SOLN
0.0000 ug/min | INTRAVENOUS | Status: DC
  Filled 2022-03-16: qty 250

## 2022-03-16 MED ORDER — SODIUM CHLORIDE 0.9 % IV BOLUS
250.0000 mL | Freq: Once | INTRAVENOUS | Status: AC
  Administered 2022-03-16: 250 mL via INTRAVENOUS

## 2022-03-16 MED ORDER — SODIUM CHLORIDE 0.9 % IV SOLN: INTRAVENOUS | Status: AC

## 2022-03-16 MED ORDER — SODIUM CHLORIDE 0.9% FLUSH: 3.0000 mL | INTRAVENOUS | Status: DC | PRN

## 2022-03-16 MED ORDER — ACETAMINOPHEN 500 MG PO TABS
1000.0000 mg | ORAL_TABLET | Freq: Once | ORAL | Status: AC
  Administered 2022-03-16: 1000 mg via ORAL
  Filled 2022-03-16: qty 2

## 2022-03-16 MED ORDER — TAMSULOSIN HCL 0.4 MG PO CAPS
0.4000 mg | ORAL_CAPSULE | Freq: Every day | ORAL | Status: DC
  Administered 2022-03-17 – 2022-03-21 (×5): 0.4 mg via ORAL
  Filled 2022-03-16 (×5): qty 1

## 2022-03-16 MED ORDER — OXYCODONE HCL 5 MG PO TABS: 5.0000 mg | ORAL_TABLET | ORAL | Status: DC | PRN

## 2022-03-16 MED ORDER — SODIUM CHLORIDE 0.9 % IV SOLN: 250.0000 mL | INTRAVENOUS | Status: DC

## 2022-03-16 MED ORDER — EPHEDRINE SULFATE-NACL 50-0.9 MG/10ML-% IV SOSY
PREFILLED_SYRINGE | INTRAVENOUS | Status: DC | PRN
  Administered 2022-03-16: 10 mg via INTRAVENOUS

## 2022-03-16 MED ORDER — PIRFENIDONE 267 MG PO TABS: 801.0000 mg | ORAL_TABLET | Freq: Three times a day (TID) | ORAL | Status: DC

## 2022-03-16 MED ORDER — ONDANSETRON HCL 4 MG/2ML IJ SOLN: 4.0000 mg | Freq: Four times a day (QID) | INTRAMUSCULAR | Status: DC | PRN

## 2022-03-16 MED ORDER — ATORVASTATIN CALCIUM 80 MG PO TABS
80.0000 mg | ORAL_TABLET | Freq: Every day | ORAL | Status: DC
  Administered 2022-03-17 – 2022-03-21 (×5): 80 mg via ORAL
  Filled 2022-03-16 (×5): qty 1

## 2022-03-16 MED ORDER — LIDOCAINE HCL 1 % IJ SOLN
INTRAMUSCULAR | Status: AC
  Filled 2022-03-16: qty 40

## 2022-03-16 MED ORDER — ACETAMINOPHEN 650 MG RE SUPP: 650.0000 mg | Freq: Four times a day (QID) | RECTAL | Status: DC | PRN

## 2022-03-16 MED ORDER — SODIUM CHLORIDE 0.9 % IV SOLN: 250.0000 mL | INTRAVENOUS | Status: DC | PRN

## 2022-03-16 SURGICAL SUPPLY — 29 items
BAG SNAP BAND KOVER 36X36 (MISCELLANEOUS) ×4 IMPLANT
CABLE ADAPT PACING TEMP 12FT (ADAPTER) IMPLANT
CATH 26 ULTRA DELIVERY (CATHETERS) IMPLANT
CATH DIAG 6FR PIGTAIL ANGLED (CATHETERS) IMPLANT
CATH INFINITI 6F AL2 (CATHETERS) IMPLANT
CLOSURE MYNX CONTROL 6F/7F (Vascular Products) IMPLANT
CLOSURE PERCLOSE PROSTYLE (VASCULAR PRODUCTS) IMPLANT
CRIMPER (MISCELLANEOUS) IMPLANT
DEVICE INFLATION ATRION QL2530 (MISCELLANEOUS) IMPLANT
ELECT DEFIB PAD ADLT CADENCE (PAD) IMPLANT
GUIDEWIRE ANGLED .035X150CM (WIRE) IMPLANT
KIT HEART LEFT (KITS) ×2 IMPLANT
KIT MICROPUNCTURE NIT STIFF (SHEATH) IMPLANT
KIT SAPIAN 3 ULTRA RESILIA 26 (Valve) IMPLANT
PACK CARDIAC CATHETERIZATION (CUSTOM PROCEDURE TRAY) ×2 IMPLANT
SHEATH INTRODUCER SET 20-26 (SHEATH) IMPLANT
SHEATH PINNACLE 6F 10CM (SHEATH) IMPLANT
SHEATH PINNACLE 8F 10CM (SHEATH) IMPLANT
SHEATH PROBE COVER 6X72 (BAG) IMPLANT
SLEEVE REPOSITIONING LENGTH 30 (MISCELLANEOUS) IMPLANT
STOPCOCK MORSE 400PSI 3WAY (MISCELLANEOUS) ×4 IMPLANT
TRANSDUCER W/STOPCOCK (MISCELLANEOUS) ×4 IMPLANT
TUBING CONTRAST HIGH PRESS 48 (TUBING) IMPLANT
WIRE AMPLATZ SS-J .035X180CM (WIRE) IMPLANT
WIRE EMERALD 3MM-J .035X150CM (WIRE) IMPLANT
WIRE EMERALD 3MM-J .035X260CM (WIRE) IMPLANT
WIRE EMERALD ST .035X260CM (WIRE) IMPLANT
WIRE PACING TEMP ST TIP 5 (CATHETERS) IMPLANT
WIRE SAFARI SM CURVE 275 (WIRE) IMPLANT

## 2022-03-16 NOTE — Progress Notes (Signed)
  [facility] TEAM  Patient doing well s/p TAVR. He is hemodynamically stable. On NRB due to ILD and post procedure anxiety. Feeling better with stable SPO2. Groin sites with slight oozing but no hematoma. ECG with no high grade block. Arterial line discontinued and transferred to 4E. Plan for early ambulation after bedrest completed and hopeful discharge over the next 24-48 hours.   Kathyrn Drown NP-C Structural Heart Team  Pager: [phone] Phone: [phone]

## 2022-03-16 NOTE — Progress Notes (Signed)
RT called for desaturation episode. Upon arrival patient on NRB at 15 lpm at sats were 92-94%. Will try to titrate through the night if possible. RN states patient was trying to eat when the desaturation episode happened. Will leave on NRB at this time. Bilateral breath sounds clear but decreased. Patient in no respiratory distress at this time. BP is a soft.

## 2022-03-16 NOTE — Anesthesia Procedure Notes (Signed)
Procedure Name: MAC Date/Time: 03/16/2022 1:42 PM  Performed by: Moshe Salisbury, CRNAPre-anesthesia Checklist: Patient identified, Emergency Drugs available, Suction available and Patient being monitored Oxygen Delivery Method: Nasal cannula Placement Confirmation: positive ETCO2 Dental Injury: Teeth and Oropharynx as per pre-operative assessment

## 2022-03-16 NOTE — Discharge Summary (Incomplete)
[facility] VALVE TEAM  Discharge Summary    Patient ID: [name] MRN: [id number]; DOB: December 28, 1950  Admit date: 03/16/2022 Discharge date: 03/21/2022  Primary Care Provider: Isaac Bliss, Rayford Halsted, MD  Primary Cardiologist: Kirk Ruths, MD / Dr. Burt Knack, MD and Dr. 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) (HCC)   Chronic respiratory failure with hypoxia (HCC)   ILD (interstitial lung disease) ([place])   S/P TAVR (transcatheter aortic valve replacement)   Dyslipidemia   Essential hypertension   Coronary atherosclerosis   Atrial flutter ([place])   Presence of Watchman left atrial appendage closure device  Allergies No Known Allergies  Diagnostic Studies/Procedures     TAVR OPERATIVE NOTE     Date of Procedure:                03/16/2022   Preoperative Diagnosis:      Severe Aortic Stenosis    Postoperative Diagnosis:    Same    Procedure:        Transcatheter Aortic Valve Replacement - Percutaneous  Transfemoral Approach             Edwards Sapien 3 Ultra Resilia THV (size 26 mm, serial # [id number])              Co-Surgeons:                        Coralie Common, MD and [name], MD   Anesthesiologist:                  Suann Larry, MD   Echocardiographer:              Jenkins Rouge, MD   Pre-operative Echo Findings: Severe aortic stenosis Mild left ventricular systolic dysfunction   Post-operative Echo Findings: No paravalvular leak unchanged left ventricular systolic function  _____________   Echocardiogram 03/17/22:  1. Left ventricular ejection fraction, by estimation, is 45 to 50%. The  left ventricle has mildly decreased function. The left ventricle  demonstrates global hypokinesis. Left ventricular diastolic function could  not be evaluated. Elevated left   ventricular end-diastolic pressure.   2. Right ventricular systolic function is normal. The right ventricular  size is normal. There is normal pulmonary artery systolic pressure. The  estimated right ventricular systolic pressure is 123456 mmHg.   3. Left atrial size was moderately dilated.   4. The mitral valve is degenerative. Trivial mitral valve regurgitation.  No evidence of mitral stenosis. Moderate mitral annular calcification.   5. The aortic valve has been repaired/replaced. Aortic valve  regurgitation is not visualized. No aortic stenosis is present. There is a  26 mm Edwards Ultra, stented (TAVR) valve present in the aortic position.  Procedure Date: 03/16/22. Aortic valve area,   by VTI measures 2.12 cm. Aortic valve mean gradient measures 15.5 mmHg.  Aortic valve Vmax measures 2.60 m/s.   6. The inferior vena cava is dilated in size with <50% respiratory  variability, suggesting right atrial pressure of 15 mmHg.   7. Compared to immdediate post TAVR study 03/16/22, the mean TAVR gradient  has increased from 538mHg to 162m, Vmax has increased from 2.2 to 2.38m738m and DVI has increased from 0.57 to 0.67.   Limited Echocardiogram 03/16/22 IMPRESSIONS  1. Septal apical and inferior wall  hypokinesis . Left ventricular  ejection fraction, by estimation, is 45 to 50%. The left ventricle has  mildly decreased function. The left ventricle demonstrates regional wall  motion abnormalities (see scoring  diagram/findings for description). The left ventricular internal cavity  size was mildly dilated.   2. The mitral valve is abnormal. Trivial mitral valve regurgitation.  Moderate mitral annular calcification.   3. Pre TAVR tri leaflet AV with severe AS mean gradient 27 peak 46 mmHg  AVA 1.6 cm2 supine in cath lab           Post TAVR: well place 26 mm Sapien 3 valve with no significant PVL  mean gradient 11 peak 20 mmHg AVI 3.1 cm2. The aortic valve has been  repaired/replaced. There  is a 26 mm Sapien prosthetic (TAVR) valve present  in the aortic position. Procedure Date:   03/16/2022.   History of Present Illness     [name] is a 72 y.o. 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    Mr. [name] has a long hx of cirrhosis complicated by thrombocytopenia and is followed closely by GI. Additionally he is followed by pulmonology due to interstitial lung disease. He has undergone high-resolution CT scanning and PFTs with Dr. Vaughan Browner. He has been followed for quite some time by Dr. Stanford Breed for his cardiology care. He has been monitored with surveillance echocardiogram imaging for progressive aortic stenosis. The patient is known to the structural heart team after undergoing Watchman implant with Dr. Burt Knack 12/2021. He has done well however wished to see Dr. Burt Knack once again for further evaluation of his aortic stenosis.   At the time of consult, he had c/o progressive dyspnea and fatigue. He was not felt to be a surgical candidate due to high risk co-morbitities as above. Post LAAO CT was converted to pre TAVR scans which showed anatomy suitable to proceed. R/LHC showed multivessel coronary artery disease, continued patency of the LIMA to LAD and saphenous vein graft to first diagonal with chronic occlusion of the saphenous vein graft OM and saphenous vein graft to PDA.   The patient was then evaluated by the multidisciplinary valve team and felt to have severe, symptomatic aortic stenosis and to be a suitable candidate for TAVR, which was set up for 03/16/22.     Hospital Course    Severe AS: s/p successful TAVR with a 26 mm Edwards Sapien 3 THV via the TF approach on 03/16/22. Post TAVR echo with LVEF at 45-50% with mild hypokinesis. Mean gradient has increased from  11>>29mHg from immediate post TAVR echo to POD 1 with DVI from 0.57>>0.67. He was continued on Plavix monotherapy s/p LAAO closure with Watchman. However, his Plt count dropped below 50K. It was felt that his risk of bleeding would be lower on low dose ASA. Therefore, his Plavix was DC'd and he was placed on ASA 81 mg. He has f/u with JKathyrn Drown NP 2/26.    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 He was aggressively diuresed with IV Lasix post op with improvement in volume status. He was ultimately transitioned to oral Lasix. His Creatinine remained stable and was 0.82 on the day of DC. Jardiance was resumed 2/18. DC weight is 208.78 lbs (peak wt POD 1 was  215.61 lbs). He has f/u with Kathyrn Drown, NP 2/26 and will need a BMET at that visit.   Paroxysmal atrial fibrillation: s/p LAAO with 31m Watchman device on 12/17/21. He is no longer on Eliquis. He was to continue Plavix monotherapy through 6 months (06/17/22) post implant. However due to worsening thrombocytopenia, Plavix was stopped and he was transitioned to ASA 81m    Pulmonary fibrosis with hypoxia: The pt had continued hypoxia post TVAR with known ILD. Weaned off NRB>>>15 [place]. However, he required HFNC. CSW asked to assist with HH oxygen services (has tanks but cannot contact anyone for refills). Goal range 88-90%. Ordered IS. Follows with pulmonary and prescribed Esbriet. CXR showed no acute disease. CCM was consulted due to worsening hypoxia. He was seen by Dr. MaVaughan Brownernd placed on Solu-Medrol. High Res CT was ordered. His respiratory status continued to improve. Dr. MaVaughan Brownerelt that the CT could be done as an outpatient. He changed his Solu-Medrol to Prednisone 40 mg once daily (taper by 10 mg every week). He will follow up with the patient in 2-4 weeks. He will need supplemental O2 on discharge.    Nonalcoholic cirrhosis with splenomegaly and portal venous  hypertension: Followed closely by GI and felt to be high risk for long term anticoagulation and is now s/p LAAO closure with Watchman device.   Thrombocytopenia: s/p Watchman and has been treated with Plavix monotherapy. Due to worsening thrombocytopenia, his Plavix was stopped and transitioned to ASA 8139mPlt count remained stable at discharge (41,000).    Elevated RAISE score: Kappa/lambda light chain panel abnormal with elevated kappa and lambda chains with elevated ratio at 1.96. Urine immunofixation needs to be collected. Care order placed to RN. Myeloma panel pending.    Dispo: Pt was seen by Dr. CreStanford Breedis morning. The pt was then seen by Dr. ManVaughan Brownerom pulmonary. Dr. ManVaughan Brownerlt the patient was stable and ready for DC home from a pulmonary standpoint. I reviewed with Dr. CreStanford Breedo felt the patient was ready from a CV standpoint for DC to home. He is discharged to home in stable condition.  Consultants: Pulmonary    _____________  Discharge Vitals Blood pressure (!) 123/49, pulse (!) 58, temperature 97.7 F (36.5 C), temperature source Oral, resp. rate 19, height 5' 6"$  (1.676 m), weight 94.7 kg, SpO2 93 %.  Filed Weights   03/19/22 0316 03/20/22 0500 03/21/22 0638  Weight: 95.4 kg 93.8 kg 94.7 kg     Labs & Radiologic Studies    CBC Recent Labs    03/20/22 0113 03/21/22 0109  WBC 2.9* 5.4  HGB 11.2* 12.3*  HCT 34.8* 36.2*  MCV 94.6 92.3  PLT 40* 41*   Basic Metabolic Panel Recent Labs    03/20/22 0113 03/21/22 0109  NA 135 134*  K 3.9 4.4  CL 103 105  CO2 24 25  GLUCOSE 159* 166*  BUN 15 22  CREATININE 0.82 0.82  CALCIUM 8.5* 8.8*   _____________   DG CHEST PORT 1 VIEW  Result Date: 03/17/2022 CLINICAL DATA:  Hypoxia EXAM: PORTABLE CHEST 1 VIEW COMPARISON:  CT chest dated 02/24/2022 FINDINGS: Mild subpleural scarring/fibrosis in the lung bases, favoring chronic interstitial lung disease. No focal consolidation. No pleural effusion or pneumothorax.  Cardiomegaly.  Postsurgical changes related to prior CABG. Median sternotomy. IMPRESSION: No evidence of acute cardiopulmonary disease. Mild chronic interstitial lung disease. Electronically Signed   By: SriJulian HyD.   On: 03/17/2022 12:15     Disposition  Pt is being discharged home today in good condition.  Follow-up Plans & Appointments     Follow-up Information     Tommie Raymond, NP Follow up on 03/29/2022.   Specialty: Cardiology Why: @ 845am. Please arrive at 830am. Contact information: [address] [place] [address] [phone]         [facility] Follow up.   Why: Home 02 - call for needed supplies Contact information: [place] [address]  [phone].        [facility] Follow up.   Specialty: Rehabilitation Why: Outpt PT Contact information: [place] [id number] [place] [phone]               Discharge Instructions     (HEART FAILURE PATIENTS) Call MD:  Anytime you have any of the following symptoms: 1) 3 pound weight gain in 24 hours or 5 pounds in 1 week 2) shortness of breath, with or without a dry hacking cough 3) swelling in the hands, feet or stomach 4) if you have to sleep on extra pillows at night in order to breathe.   Complete by: As directed    Amb Referral to Cardiac Rehabilitation   Complete by: As directed    Diagnosis: Valve Replacement   Valve: Aortic   After initial evaluation and assessments completed: Virtual Based Care may be provided alone or in conjunction with Phase 2 Cardiac Rehab based on patient barriers.: Yes   Intensive Cardiac Rehabilitation (ICR) [place] location only OR Traditional Cardiac Rehabilitation (TCR) *If criteria for ICR are not met will enroll in TCR [facility] only): Yes   Diet - low sodium heart healthy   Complete by: As directed    Driving Restrictions   Complete  by: As directed    See specific post TAVR instructions on your discharge paperwork.   Increase activity slowly   Complete by: As directed    See specific post TAVR instructions on your discharge paperwork.   Lifting restrictions   Complete by: As directed    See specific post TAVR instructions on your discharge paperwork.   Other Restrictions   Complete by: As directed    See specific post TAVR instructions on your discharge paperwork.   Sexual Activity Restrictions   Complete by: As directed    See specific post TAVR instructions on your discharge paperwork.       Discharge Medications   Allergies as of 03/21/2022   No Known Allergies      Medication List     STOP taking these medications    clopidogrel 75 MG tablet Commonly known as: PLAVIX   Pirfenidone 267 MG Tabs Commonly known as: Esbriet       TAKE these medications    acetaminophen 500 MG tablet Commonly known as: TYLENOL Take 1,000 mg by mouth daily as needed for moderate pain or headache.   aspirin EC 81 MG tablet Take 1 tablet (81 mg total) by mouth daily. Swallow whole. Start taking on: March 22, 2022   atorvastatin 80 MG tablet Commonly known as: LIPITOR TAKE 1 TABLET BY MOUTH EVERY DAY   carvedilol 3.125 MG tablet Commonly known as: COREG TAKE 1 TABLET BY MOUTH TWICE A DAY WITH FOOD   [employer] VITAMIN B12 1000 MCG tablet Generic drug: cyanocobalamin TAKE 1 TABLET BY MOUTH EVERY DAY   empagliflozin 10 MG Tabs tablet Commonly known as:  Jardiance Take 1 tablet (10 mg total) by mouth daily before breakfast.   furosemide 40 MG tablet Commonly known as: LASIX Take 2 tablets (80 mg total) by mouth 2 (two) times daily.   Klor-Con M20 20 MEQ tablet Generic drug: potassium chloride SA TAKE 1 TABLET BY MOUTH EVERY DAY   LIVER PROTECT PO Take 1 capsule by mouth 2 (two) times daily. From [employer]   predniSONE 20 MG tablet Commonly known as: DELTASONE Take 2 tablets (40 mg total) by  mouth daily with breakfast for 7 days, THEN 1.5 tablets (30 mg total) daily with breakfast for 7 days, THEN 1 tablet (20 mg total) daily with breakfast for 7 days, THEN 0.5 tablets (10 mg total) daily with breakfast. Start taking on: March 21, 2022   tamsulosin 0.4 MG Caps capsule Commonly known as: FLOMAX TAKE ONE CAPSULE BY MOUTH EVERY DAY AFTER SUPPER               Durable Medical Equipment  (From admission, onward)           Start     Ordered   03/21/22 1454  DME Oxygen  Once       Question Answer Comment  Length of Need 6 Months   Mode or (Route) Nasal cannula   Frequency Continuous (stationary and portable oxygen unit needed)   Oxygen delivery system Gas      03/21/22 1453                Outstanding Labs/Studies   BMET 03/29/22  Duration of Discharge Encounter   Greater than 30 minutes including physician time.  Signed, Richardson Dopp, PA-C 03/21/2022, 2:59 PM

## 2022-03-16 NOTE — Op Note (Signed)
[facility]   MULTIDISCIPLINARY HEART VALVE TEAM   TAVR OPERATIVE NOTE   Date of Procedure:  03/16/2022  Preoperative Diagnosis: Severe Aortic Stenosis   Postoperative Diagnosis: Same   Procedure:   Transcatheter Aortic Valve Replacement - Percutaneous  Transfemoral Approach  Edwards Sapien 3 Ultra Resilia THV (size 26 mm, serial # [id number])   Co-Surgeons:  Coralie Common, MD and [name], MD  Anesthesiologist:  Suann Larry, MD  Echocardiographer:  Jenkins Rouge, MD  Pre-operative Echo Findings: Severe aortic stenosis Mild left ventricular systolic dysfunction  Post-operative Echo Findings: No paravalvular leak unchanged left ventricular systolic function  BRIEF CLINICAL NOTE AND INDICATIONS FOR SURGERY  72 year old male with severe aortic stenosis, prior CABG, cirrhosis, thrombocytopenia, and pulmonary fibrosis on home oxygen.  The patient has developed progressive symptoms of dyspnea and fatigue and presents today for transfemoral TAVR after undergoing preoperative testing and a multidisciplinary review of his case.  During the course of the patient's preoperative work up they have been evaluated comprehensively by a multidisciplinary team of specialists coordinated through the [facility] in the [facility].  They have been demonstrated to suffer from symptomatic severe aortic stenosis as noted above. The patient has been counseled extensively as to the relative risks and benefits of all options for the treatment of severe aortic stenosis including long term medical therapy, conventional surgery for aortic valve replacement, and transcatheter aortic valve replacement.  The patient has been independently evaluated in formal cardiac surgical consultation by Dr Lavonna Monarch, who deemed the patient appropriate for TAVR. Based upon review of all of the patient's preoperative diagnostic tests they are felt to be  candidate for transcatheter aortic valve replacement using the transfemoral approach as an alternative to conventional surgery.    Following the decision to proceed with transcatheter aortic valve replacement, a discussion has been held regarding what types of management strategies would be attempted intraoperatively in the event of life-threatening complications, including whether or not the patient would be considered a candidate for the use of cardiopulmonary bypass and/or conversion to open sternotomy for attempted surgical intervention.  The patient has been advised of a variety of complications that might develop peculiar to this approach including but not limited to risks of death, stroke, paravalvular leak, aortic dissection or other major vascular complications, aortic annulus rupture, device embolization, cardiac rupture or perforation, acute myocardial infarction, arrhythmia, heart block or bradycardia requiring permanent pacemaker placement, congestive heart failure, respiratory failure, renal failure, pneumonia, infection, other late complications related to structural valve deterioration or migration, or other complications that might ultimately cause a temporary or permanent loss of functional independence or other long term morbidity.  The patient provides full informed consent for the procedure as described and all questions were answered preoperatively.  DETAILS OF THE OPERATIVE PROCEDURE  PREPARATION:   The patient is brought to the operating room on the above mentioned date and central monitoring was established by the anesthesia team including placement of a radial arterial line. The patient is placed in the supine position on the operating table.  Intravenous antibiotics are administered. The patient is monitored closely throughout the procedure under conscious sedation. Baseline transthoracic echocardiogram is performed. The patient's chest, abdomen, both groins, and both lower  extremities are prepared and draped in a sterile manner. A time out procedure is performed.   PERIPHERAL ACCESS:   Using ultrasound guidance, femoral arterial access is obtained with placement of a 6 Fr sheath on the left  side.  [place] images are digitally captured and stored in the patient's chart. A pigtail diagnostic catheter was passed through the femoral arterial sheath under fluoroscopic guidance into the aortic root.  Using ultrasound guidance, the left internal jugular vein is accessed.  Micropuncture technique is used.  This is changed out for a 6 [place] sheath.  Ultrasound images are digitally captured and stored in the patient's chart.  A temporary transvenous pacemaker catheter was passed through the internal jugular venous sheath under fluoroscopic guidance into the right ventricle.  The pacemaker was tested to ensure stable lead placement and pacemaker capture. Aortic root angiography was performed in order to determine the optimal angiographic angle for valve deployment.  TRANSFEMORAL ACCESS:  A micropuncture technique is used to access the right femoral artery under fluoroscopic and ultrasound guidance.  2 Perclose devices are deployed at 10' and 2' positions to 'PreClose' the femoral artery. An 8 French sheath is placed and then an Amplatz Superstiff wire is advanced through the sheath. This is changed out for a 14 French transfemoral E-Sheath after progressively dilating over the Superstiff wire.  An AL-2 catheter was used to direct a straight-tip exchange length wire across the native aortic valve into the left ventricle. This was exchanged out for a pigtail catheter and position was confirmed in the LV apex. The pigtail catheter was exchanged for a [place] wire in the LV apex.    BALLOON AORTIC VALVULOPLASTY:  Not performed  TRANSCATHETER HEART VALVE DEPLOYMENT:  An Edwards Sapien 3 Ultra Resilia transcatheter heart valve (size 26 mm) was prepared and crimped per manufacturer's  guidelines, and the proper orientation of the valve is confirmed on the [employer] delivery system. The valve was advanced through the introducer sheath using normal technique until in an appropriate position in the abdominal aorta beyond the sheath tip. The balloon was then retracted and using the fine-tuning wheel was centered on the valve. The valve was then advanced across the aortic arch using appropriate flexion of the catheter. The valve was carefully positioned across the aortic valve annulus. The Commander catheter was retracted using normal technique. Once final position of the valve has been confirmed by angiographic assessment, the valve is deployed while temporarily holding ventilation and during rapid ventricular pacing to maintain systolic blood pressure < 50 mmHg and pulse pressure < 10 mmHg. The balloon inflation is held for >3 seconds after reaching full deployment volume. Once the balloon has fully deflated the balloon is retracted into the ascending aorta and valve function is assessed using echocardiography. The patient's hemodynamic recovery following valve deployment is good.  The deployment balloon and guidewire are both removed. Echo demostrated acceptable post-procedural gradients, stable mitral valve function, and no aortic insufficiency.    PROCEDURE COMPLETION:  The sheath was removed and femoral artery closure is performed using the 2 previously deployed Perclose devices.  Protamine is administered once femoral arterial repair was complete. The site is clear with no evidence of bleeding or hematoma after the sutures are tightened. The temporary pacemaker and pigtail catheters are removed. Mynx closure is used for contralateral femoral arterial hemostasis for the 6 Fr sheath.  The patient tolerated the procedure well and is transported to the recovery area in stable condition. There were no immediate intraoperative complications. All sponge instrument and needle counts are  verified correct at completion of the operation.   The patient received a total of 60 mL of intravenous contrast during the procedure.  EBL: minimal     [name],  MD 03/16/2022 3:57 PM

## 2022-03-16 NOTE — Op Note (Signed)
[facility]   MULTIDISCIPLINARY HEART VALVE TEAM   TAVR OPERATIVE NOTE   Date of Procedure:  03/16/2022  Preoperative Diagnosis: Severe Aortic Stenosis   Postoperative Diagnosis: Same   Procedure:   Transcatheter Aortic Valve Replacement - Percutaneous right Transfemoral Approach  Edwards Sapien 3 Ultra THV (size 26 mm, model # 9755RSL, serial # [id number])   Co-Surgeons  Coralie Common MD and [name], MD   Anesthesiologist:  Roderic Palau MD  Echocardiographer:  Jenkins Rouge MD  Pre-operative Echo Findings: Severe aortic stenosis Depressed  left ventricular systolic function  Post-operative Echo Findings: no paravalvular leak Depressed  left ventricular systolic function (unchanged)   BRIEF CLINICAL NOTE AND INDICATIONS FOR SURGERY    72 yo with NYHA class 2 symptoms of moderate to severe AS. Pt with his age, frailty, previous CABG surgery, cirrhosis, thrombocytopenia, pulmonary fibrosis on home oxygen therapy, is not a surgical candidate and TAVR should be offered. He has appropriate anatomy for a Sapien 26 mm valve via the right femoral approach.     DETAILS OF THE OPERATIVE PROCEDURE  PREPARATION:    The patient was brought to the operating room on the above mentioned date and appropriate monitoring was established by the anesthesia team. The patient was placed in the supine position on the operating table.  Intravenous antibiotics were administered. The patient was monitored closely throughout the procedure under conscious sedation.   Baseline transthoracic echocardiogram was performed. The patient's abdomen and both groins were prepped and draped in a sterile manner. A time out procedure was performed.   PERIPHERAL ACCESS:    Using the modified Seldinger technique, femoral arterial and right jugular venous access was obtained with placement of 6 Fr sheaths on the left side.  A pigtail diagnostic catheter was passed through the left  arterial sheath under fluoroscopic guidance into the aortic root.  A temporary transvenous pacemaker catheter was passed through the right  jugular venous sheath under fluoroscopic guidance into the right ventricle.  The pacemaker was tested to ensure stable lead placement and pacemaker capture. Aortic root angiography was performed in order to determine the optimal angiographic angle for valve deployment.   TRANSFEMORAL ACCESS:   Percutaneous transfemoral access and sheath placement was performed using ultrasound guidance.  The right common femoral artery was cannulated using a micropuncture needle and appropriate location was verified using hand injection angiogram.  A pair of Abbott Perclose percutaneous closure devices were placed and a 6 French sheath replaced into the femoral artery.  The patient was heparinized systemically and ACT verified > 250 seconds.    A 14 Fr transfemoral E-sheath was introduced into the right common femoral artery after progressively dilating over an Amplatz superstiff wire. An AL2 catheter was used to direct a straight-tip exchange length wire across the native aortic valve into the left ventricle. This was exchanged out for a pigtail catheter and position was confirmed in the LV apex.  The pigtail catheter was exchanged for a Safari wire in the LV apex.   BALLOON AORTIC VALVULOPLASTY:   Balloon aortic valvuloplasty was not performed  TRANSCATHETER HEART VALVE DEPLOYMENT:   An Edwards Sapien 3 Ultra transcatheter heart valve (size 26 mm) was prepared and crimped per manufacturer's guidelines, and the proper orientation of the valve is confirmed on the [employer]. The valve was advanced through the introducer sheath using normal technique until in an appropriate position in the abdominal aorta beyond the sheath tip. The balloon was then retracted and  using the fine-tuning wheel was centered on the valve. The valve was then advanced across the  aortic arch using appropriate flexion of the catheter. The valve was carefully positioned across the aortic valve annulus. The Commander catheter was retracted using normal technique. Once final position of the valve has been confirmed by angiographic assessment, the valve is deployed during rapid ventricular pacing to maintain systolic blood pressure < 50 mmHg and pulse pressure < 10 mmHg. The balloon inflation is held for >3 seconds after reaching full deployment volume. Once the balloon has fully deflated the balloon is retracted into the ascending aorta and valve function is assessed using echocardiography. There is felt to be no paravalvular leak and no central aortic insufficiency.  The patient's hemodynamic recovery following valve deployment is good.  The deployment balloon and guidewire are both removed.    PROCEDURE COMPLETION:   The sheath was removed and femoral artery closure performed.  Protamine was administered once femoral arterial repair was complete. The temporary pacemaker, pigtail catheter and femoral sheaths were removed with manual pressure used for venous hemostasis.  A Mynx femoral closure device was utilized following removal of the diagnostic sheath in the left femoral artery.  The patient tolerated the procedure well and is transported to the cath lab recovery area in stable condition. There were no immediate intraoperative complications. All sponge instrument and needle counts are verified correct at completion of the operation.   No blood products were administered during the operation.  The patient received a total of 40 mL of intravenous contrast during the procedure.   Coralie Common, MD 03/16/2022 3:16 PM

## 2022-03-16 NOTE — Progress Notes (Addendum)
Nurse called reporting patient has low BP 85/42. Patient assessed at bedside, alert, opens eyes to voice, denied any chest pain, SOB, dizziness. He has been NPO all day before TAVR. Lung sounds clear bilaterally, on NRB. Cardiac [id number], grade II systolic murmur. BLE no edema. Right groin site with dressing, dry blood noted scant, no hematoma or hemorrhage. RLE warm to touch. Abdomen soft, non-tender.   Post TAVR Echo today: LVEF 45-50%, Septal apical and inferior wall hypokinesis. Trivial MR.  Pre TAVR tri leaflet AV with severe AS mean gradient 27 peak 46 mmHg  AVA 1.6 cm2 supine in cath lab  Post TAVR: well place 26 mm Sapien 3 valve with no significant PVL mean gradient 11 peak 20 mmHg AVI 3.1 cm2. The aortic valve has been repaired/replaced. There is a 26 mm Sapien prosthetic (TAVR) valve present in the aortic position.    Will give IV 291m NS bolus now. Check CBC. Suspect dehydration + anesthesia. Trend BP. Monitor for symptoms.

## 2022-03-16 NOTE — Transfer of Care (Signed)
Immediate Anesthesia Transfer of Care Note  Patient: [name]  Procedure(s) Performed: Transcatheter Aortic Valve Replacement, Transfemoral (Right) INTRAOPERATIVE TRANSTHORACIC ECHOCARDIOGRAM  Patient Location: Cath Lab  Anesthesia Type:MAC  Level of Consciousness: drowsy and patient cooperative  Airway & Oxygen Therapy: Patient Spontanous Breathing and Patient connected to nasal cannula oxygen  Post-op Assessment: Report given to RN, Post -op Vital signs reviewed and stable, and Patient moving all extremities  Post vital signs: Reviewed and stable  Last Vitals:  Vitals Value Taken Time  BP 117/50 03/16/22 1531  Temp    Pulse 66 03/16/22 1532  Resp 23 03/16/22 1532  SpO2 85 % 03/16/22 1532  Vitals shown include unvalidated device data.  Last Pain:  Vitals:   03/16/22 1532  TempSrc:   PainSc: 0-No pain         Complications: There were no known notable events for this encounter.

## 2022-03-16 NOTE — Anesthesia Preprocedure Evaluation (Addendum)
Anesthesia Evaluation  Patient identified by MRN, date of birth, ID band Patient awake    Reviewed: Allergy & Precautions, H&P , NPO status , Patient's Chart, lab work & pertinent test results  Airway Mallampati: II  TM Distance: >3 FB Neck ROM: Full    Dental no notable dental hx. (+) Teeth Intact, Dental Advisory Given   Pulmonary neg pulmonary ROS, shortness of breath, sleep apnea    Pulmonary exam normal breath sounds clear to auscultation       Cardiovascular hypertension, Pt. on medications and Pt. on home beta blockers + CAD and +CHF  + Valvular Problems/Murmurs AS  Rhythm:Regular Rate:Normal + Systolic murmurs    Neuro/Psych negative neurological ROS  negative psych ROS   GI/Hepatic negative GI ROS, Neg liver ROS,,,  Endo/Other  negative endocrine ROS    Renal/GU Renal diseasenegative Renal ROS  negative genitourinary   Musculoskeletal   Abdominal   Peds  Hematology negative hematology ROS (+)   Anesthesia Other Findings   Reproductive/Obstetrics negative OB ROS                             Anesthesia Physical Anesthesia Plan  ASA: 4  Anesthesia Plan: MAC   Post-op Pain Management: Tylenol PO (pre-op)* and Precedex   Induction: Intravenous  PONV Risk Score and Plan: 2 and Propofol infusion and Ondansetron  Airway Management Planned: Natural Airway and Simple Face Mask  Additional Equipment: Arterial line  Intra-op Plan:   Post-operative Plan:   Informed Consent: I have reviewed the patients History and Physical, chart, labs and discussed the procedure including the risks, benefits and alternatives for the proposed anesthesia with the patient or authorized representative who has indicated his/her understanding and acceptance.     Dental advisory given  Plan Discussed with: CRNA  Anesthesia Plan Comments:        Anesthesia Quick Evaluation

## 2022-03-16 NOTE — Interval H&P Note (Signed)
History and Physical Interval Note:  03/16/2022 9:46 AM  [name]  has presented today for surgery, with the diagnosis of Severe Aortic Stenosis.  The various methods of treatment have been discussed with the patient and family. After consideration of risks, benefits and other options for treatment, the patient has consented to  Procedure(s): Transcatheter Aortic Valve Replacement, Transfemoral (Right) INTRAOPERATIVE TRANSTHORACIC ECHOCARDIOGRAM (N/A) as a surgical intervention.  The patient's history has been reviewed, patient examined, no change in status, stable for surgery.  I have reviewed the patient's chart and labs.  Questions were answered to the patient's satisfaction.     Coralie Common

## 2022-03-16 NOTE — Progress Notes (Signed)
  Echocardiogram 2D Echocardiogram has been performed.  Bobbye Charleston 03/16/2022, 3:18 PM

## 2022-03-16 NOTE — Anesthesia Postprocedure Evaluation (Signed)
Anesthesia Post Note  Patient: [name]  Procedure(s) Performed: Transcatheter Aortic Valve Replacement, Transfemoral (Right) INTRAOPERATIVE TRANSTHORACIC ECHOCARDIOGRAM     Patient location during evaluation: PACU Anesthesia Type: MAC Level of consciousness: awake and alert Pain management: pain level controlled Vital Signs Assessment: post-procedure vital signs reviewed and stable Respiratory status: spontaneous breathing, nonlabored ventilation, respiratory function stable and patient connected to face mask oxygen Cardiovascular status: stable and blood pressure returned to baseline Postop Assessment: no apparent nausea or vomiting Anesthetic complications: no Comments: Pt with Pulmonary Fibrosis. Breathing is at baseline SOB. Placed on 100% non rebreather facemask O2. Baseline sats at home on room air are in the upper 80's. Now in the lower 90's with O2 supplementation.  There were no known notable events for this encounter.  Last Vitals:  Vitals:   03/16/22 1557 03/16/22 1605  BP: (!) 166/56 (!) 163/48  Pulse: 81 82  Resp: (!) 31 (!) 30  Temp:  37.1 C  SpO2: (!) 87% (!) 89%    Last Pain:  Vitals:   03/16/22 1532  TempSrc:   PainSc: 0-No pain                 Soniyah Mcglory,W. EDMOND

## 2022-03-16 NOTE — Anesthesia Procedure Notes (Signed)
Arterial Line Insertion Start/End2/13/2024 11:00 AM, 03/16/2022 11:10 AM Performed by: Darletta Moll, CRNA, CRNA  Patient location: Pre-op. Preanesthetic checklist: patient identified, IV checked, site marked, risks and benefits discussed, surgical consent, monitors and equipment checked, pre-op evaluation, timeout performed and anesthesia consent Lidocaine 1% used for infiltration Right, radial was placed Catheter size: 20 G Hand hygiene performed , maximum sterile barriers used  and Seldinger technique used  Attempts: 1 Procedure performed without using ultrasound guided technique. Following insertion, dressing applied and Biopatch. Post procedure assessment: normal  Patient tolerated the procedure well with no immediate complications.

## 2022-03-17 ENCOUNTER — Encounter (HOSPITAL_COMMUNITY): Payer: Self-pay | Admitting: Cardiovascular Disease

## 2022-03-17 ENCOUNTER — Inpatient Hospital Stay (HOSPITAL_COMMUNITY): Payer: 59

## 2022-03-17 ENCOUNTER — Other Ambulatory Visit: Payer: Self-pay

## 2022-03-17 DIAGNOSIS — I350 Nonrheumatic aortic (valve) stenosis: Secondary | ICD-10-CM | POA: Diagnosis not present

## 2022-03-17 DIAGNOSIS — I5043 Acute on chronic combined systolic (congestive) and diastolic (congestive) heart failure: Secondary | ICD-10-CM | POA: Diagnosis not present

## 2022-03-17 DIAGNOSIS — I5023 Acute on chronic systolic (congestive) heart failure: Secondary | ICD-10-CM

## 2022-03-17 DIAGNOSIS — Z006 Encounter for examination for normal comparison and control in clinical research program: Secondary | ICD-10-CM | POA: Diagnosis not present

## 2022-03-17 DIAGNOSIS — J9622 Acute and chronic respiratory failure with hypercapnia: Secondary | ICD-10-CM | POA: Diagnosis not present

## 2022-03-17 DIAGNOSIS — Z952 Presence of prosthetic heart valve: Secondary | ICD-10-CM | POA: Diagnosis not present

## 2022-03-17 LAB — ECHOCARDIOGRAM COMPLETE
AR max vel: 2 cm2
AV Area VTI: 2.12 cm2
AV Area mean vel: 2.1 cm2
AV Mean grad: 15.5 mmHg
AV Peak grad: 26.9 mmHg
Ao pk vel: 2.6 m/s
Area-P 1/2: 2.33 cm2
Calc EF: 49.3 %
Height: 66 in
MV M vel: 2.15 m/s
MV Peak grad: 18.5 mmHg
MV VTI: 2.55 cm2
S' Lateral: 4 cm
Single Plane A2C EF: 50.3 %
Single Plane A4C EF: 48.7 %
Weight: 3449.76 oz

## 2022-03-17 LAB — CBC
HCT: 31.2 % — ABNORMAL LOW (ref 39.0–52.0)
Hemoglobin: 10.4 g/dL — ABNORMAL LOW (ref 13.0–17.0)
MCH: 31.4 pg (ref 26.0–34.0)
MCHC: 33.3 g/dL (ref 30.0–36.0)
MCV: 94.3 fL (ref 80.0–100.0)
Platelets: 54 10*3/uL — ABNORMAL LOW (ref 150–400)
RBC: 3.31 MIL/uL — ABNORMAL LOW (ref 4.22–5.81)
RDW: 17.7 % — ABNORMAL HIGH (ref 11.5–15.5)
WBC: 5.4 10*3/uL (ref 4.0–10.5)
nRBC: 0 % (ref 0.0–0.2)

## 2022-03-17 LAB — BASIC METABOLIC PANEL
Anion gap: 8 (ref 5–15)
BUN: 17 mg/dL (ref 8–23)
CO2: 19 mmol/L — ABNORMAL LOW (ref 22–32)
Calcium: 8.1 mg/dL — ABNORMAL LOW (ref 8.9–10.3)
Chloride: 110 mmol/L (ref 98–111)
Creatinine, Ser: 1.2 mg/dL (ref 0.61–1.24)
GFR, Estimated: >60 mL/min (ref >60)
Glucose, Bld: 109 mg/dL — ABNORMAL HIGH (ref 70–99)
Potassium: 4.4 mmol/L (ref 3.5–5.1)
Sodium: 137 mmol/L (ref 135–145)

## 2022-03-17 LAB — KAPPA/LAMBDA LIGHT CHAINS
Kappa free light chain: 59.3 mg/L — ABNORMAL HIGH (ref 3.3–19.4)
Kappa, lambda light chain ratio: 1.96 — ABNORMAL HIGH (ref 0.26–1.65)
Lambda free light chains: 30.3 mg/L — ABNORMAL HIGH (ref 5.7–26.3)

## 2022-03-17 LAB — MAGNESIUM: Magnesium: 1.9 mg/dL (ref 1.7–2.4)

## 2022-03-17 MED ORDER — FUROSEMIDE 10 MG/ML IJ SOLN
40.0000 mg | Freq: Once | INTRAMUSCULAR | Status: AC
  Administered 2022-03-17: 40 mg via INTRAVENOUS
  Filled 2022-03-17: qty 4

## 2022-03-17 MED ORDER — PERFLUTREN LIPID MICROSPHERE
1.0000 mL | INTRAVENOUS | Status: AC | PRN
  Administered 2022-03-17: 2 mL via INTRAVENOUS

## 2022-03-17 NOTE — Progress Notes (Signed)
  Echocardiogram 2D Echocardiogram has been performed.  [name] 03/17/2022, 11:19 AM

## 2022-03-17 NOTE — Progress Notes (Signed)
CARDIAC REHAB PHASE I    Post TAVR education including site care, restrictions, risk factors, exercise guidelines, heart healthy diet, and CRP2 reviewed with pt and wife. All questions and concerns addressed. Will refer to AP for CRP2. Will continue to follow.    [id number]  Vanessa Barbara, RN BSN 03/17/2022 11:46 AM

## 2022-03-17 NOTE — Plan of Care (Signed)
  Problem: Activity: Goal: Ability to return to baseline activity level will improve Outcome: Progressing   

## 2022-03-17 NOTE — Progress Notes (Signed)
Increased oozing right femoral. Area soft but some swelling. Margins marked.   Did bed weight on patient instead of the required standing d/t hypotension, slight increased oozing and oxygen demand.   Will continue to monitor

## 2022-03-17 NOTE — Progress Notes (Addendum)
[name]  Patient Name: [name] Date of Encounter: 03/17/2022  Primary Cardiologist: Dr. Stanford Breed, MD/ Dr. Burt Knack and Dr. Lavonna Monarch (TAVR)  Hospital Problem List     Principal Problem:   Severe aortic stenosis Active Problems:   Dyslipidemia   Essential hypertension   Coronary atherosclerosis   Chronic combined systolic and diastolic heart failure (HCC)   Atypical atrial flutter (HCC)   Atrial flutter (HCC)   Presence of Watchman left atrial appendage closure device   S/P TAVR (transcatheter aortic valve replacement)   Subjective   Patient worried about his respiratory status this morning. A bit anxious. No chest pain. Has moderate LE edema.   Inpatient Medications    Scheduled Meds:  atorvastatin  80 mg Oral Daily   clopidogrel  75 mg Oral Daily   Pirfenidone  801 mg Oral TID WC   sodium chloride flush  3 mL Intravenous Q12H   tamsulosin  0.4 mg Oral QPC supper   Continuous Infusions:  sodium chloride     sodium chloride     nitroGLYCERIN     PRN Meds: sodium chloride, acetaminophen **OR** acetaminophen, morphine injection, ondansetron (ZOFRAN) IV, oxyCODONE, sodium chloride flush, traMADol   Vital Signs    Vitals:   03/17/22 0500 03/17/22 0519 03/17/22 0600 03/17/22 0700  BP: (!) 94/42  (!) 102/43 (!) 100/46  Pulse: (!) 57  (!) 55 (!) 59  Resp: 19  17 19  $ Temp: 97.6 F (36.4 C)   97.7 F (36.5 C)  TempSrc: Axillary   Axillary  SpO2: 94% 94% 93% 94%  Weight: 97.8 kg     Height:        Intake/Output Summary (Last 24 hours) at 03/17/2022 0846 Last data filed at 03/17/2022 [id number] Gross per 24 hour  Intake 245.54 ml  Output 100 ml  Net 145.54 ml   Filed Weights   03/16/22 0935 03/17/22 0500  Weight: 94.3 kg 97.8 kg   Physical Exam    General: Well developed, well nourished, NAD Neck: Negative for carotid bruits.  Lungs:Clear to ausculation bilaterally. Breathing is labored with  movement.  Cardiovascular: RRR with S1 S2. Soft flow murmur Abdomen: Soft, non-tender, non-distended. No obvious abdominal masses. Extremities: 2+ BLE edema. Groins sites with no hematoma.  Neuro: Alert and oriented. No focal deficits. No facial asymmetry. MAE spontaneously. Psych: Responds to questions appropriately with normal affect.    Labs    CBC Recent Labs    03/16/22 1951 03/17/22 0118  WBC 3.8* 5.4  HGB 11.2* 10.4*  HCT 34.2* 31.2*  MCV 95.5 94.3  PLT 55* 54*   Basic Metabolic Panel Recent Labs    03/16/22 1542 03/17/22 0118  NA 142 137  K 4.4 4.4  CL 111 110  CO2  --  19*  GLUCOSE 113* 109*  BUN 14 17  CREATININE 0.70 1.20  CALCIUM  --  8.1*  MG  --  1.9   Liver Function Tests No results for input(s): "AST", "ALT", "ALKPHOS", "BILITOT", "PROT", "ALBUMIN" in the last 72 hours. No results for input(s): "LIPASE", "AMYLASE" in the last 72 hours. Cardiac Enzymes No results for input(s): "CKTOTAL", "CKMB", "CKMBINDEX", "TROPONINI" in the last 72 hours. BNP Invalid input(s): "POCBNP" D-Dimer No results for input(s): "DDIMER" in the last 72 hours. Hemoglobin A1C No results for input(s): "HGBA1C" in the last 72 hours. Fasting Lipid Panel No results for input(s): "CHOL", "HDL", "LDLCALC", "TRIG", "CHOLHDL", "LDLDIRECT" in the  last 72 hours. Thyroid Function Tests No results for input(s): "TSH", "T4TOTAL", "T3FREE", "THYROIDAB" in the last 72 hours.  Invalid input(s): "FREET3"  Telemetry     SR/SB- Personally Reviewed  ECG    Sinus brady with HR 59bpm, RBBB - Personally Reviewed  Radiology    ECHOCARDIOGRAM LIMITED  Result Date: 03/16/2022    ECHOCARDIOGRAM LIMITED REPORT   Patient Name:   [name] Date of Exam: 03/16/2022 Medical Rec #:  [id number]        Height:       66.0 in Accession #:    [id number]       Weight:       212.0 lb Date of Birth:  06-20-1950        BSA:          2.050 m Patient Age:    72 years         BP:           132/57 mmHg  Patient Gender: M                HR:           68 bpm. Exam Location:  Inpatient Procedure: Limited Echo, Cardiac Doppler and Color Doppler Indications:     I35.0 Nonrheumatic aortic (valve) stenosis  History:         Patient has prior history of Echocardiogram examinations, most                  recent 01/19/2022. Previous Myocardial Infarction, Abnormal                  ECG, Aortic Valve Disease, Arrythmias:Atrial Fibrillation and                  Atrial Flutter, Signs/Symptoms:Dyspnea and Shortness of Breath;                  Risk Factors:Hypertension and Dyslipidemia. Aortic stenosis.                  Watchman device.                  Aortic Valve: 26 mm Sapien prosthetic, stented (TAVR) valve is                  present in the aortic position. Procedure Date: 03/16/2022.  Sonographer:     Roseanna Rainbow RDCS Referring Phys:  [name] Diagnosing Phys: Jenkins Rouge MD IMPRESSIONS  1. Septal apical and inferior wall hypokinesis . Left ventricular ejection fraction, by estimation, is 45 to 50%. The left ventricle has mildly decreased function. The left ventricle demonstrates regional wall motion abnormalities (see scoring diagram/findings for description). The left ventricular internal cavity size was mildly dilated.  2. The mitral valve is abnormal. Trivial mitral valve regurgitation. Moderate mitral annular calcification.  3. Pre TAVR tri leaflet AV with severe AS mean gradient 27 peak 46 mmHg AVA 1.6 cm2 supine in cath lab         Post TAVR: well place 26 mm Sapien 3 valve with no significant PVL mean gradient 11 peak 20 mmHg AVI 3.1 cm2. The aortic valve has been repaired/replaced. There is a 26 mm Sapien prosthetic (TAVR) valve present in the aortic position. Procedure Date:  03/16/2022. FINDINGS  Left Ventricle: Septal apical and inferior wall hypokinesis. Left ventricular ejection fraction, by estimation, is 45 to 50%. The left ventricle has mildly decreased function. The left ventricle demonstrates regional  wall motion abnormalities. The left ventricular internal cavity size was mildly dilated. Mitral Valve: The mitral valve is abnormal. There is mild thickening of the mitral valve leaflet(s). There is mild calcification of the mitral valve leaflet(s). Moderate mitral annular calcification. Trivial mitral valve regurgitation. Tricuspid Valve: The tricuspid valve is normal in structure. Tricuspid valve regurgitation is mild. Aortic Valve: Pre TAVR tri leaflet AV with severe AS mean gradient 27 peak 46 mmHg AVA 1.6 cm2 supine in cath lab Post TAVR: well place 26 mm Sapien 3 valve with no significant PVL mean gradient 11 peak 20 mmHg AVI 3.1 cm2. The aortic valve has been repaired/replaced. Aortic valve mean gradient measures 11.0 mmHg. Aortic valve peak gradient measures 19.7 mmHg. Aortic valve area, by VTI measures 3.05 cm. There is a 26 mm Sapien prosthetic, stented (TAVR) valve present in the aortic position. Procedure Date: 03/16/2022. Additional Comments: Spectral Doppler performed. Color Doppler performed.  LEFT VENTRICLE PLAX 2D LVIDd:         5.40 cm LVIDs:         3.90 cm LV PW:         1.00 cm LV IVS:        1.30 cm LVOT diam:     2.60 cm LV SV:         157 LV SV Index:   77 LVOT Area:     5.31 cm  LV Volumes (MOD) LV vol d, MOD A2C: 164.0 ml LV vol d, MOD A4C: 197.0 ml LV vol s, MOD A2C: 78.5 ml LV vol s, MOD A4C: 79.9 ml LV SV MOD A2C:     85.5 ml LV SV MOD A4C:     197.0 ml LV SV MOD BP:      106.3 ml AORTIC VALVE AV Area (Vmax):    3.20 cm AV Area (Vmean):   3.12 cm AV Area (VTI):     3.05 cm AV Vmax:           222.00 cm/s AV Vmean:          151.500 cm/s AV VTI:            0.516 m AV Peak Grad:      19.7 mmHg AV Mean Grad:      11.0 mmHg LVOT Vmax:         134.00 cm/s LVOT Vmean:        89.100 cm/s LVOT VTI:          0.296 m LVOT/AV VTI ratio: 0.57  SHUNTS Systemic VTI:  0.30 m Systemic Diam: 2.60 cm Jenkins Rouge MD Electronically signed by Jenkins Rouge MD Signature Date/Time: 03/16/2022/3:38:34 PM     Final    Structural Heart Procedure  Result Date: 03/16/2022 See surgical note for result.   Cardiac Studies     TAVR OPERATIVE NOTE     Date of Procedure:                03/16/2022   Preoperative Diagnosis:      Severe Aortic Stenosis    Postoperative Diagnosis:    Same    Procedure:        Transcatheter Aortic Valve Replacement - Percutaneous  Transfemoral Approach             Edwards Sapien 3 Ultra Resilia THV (size 26 mm, serial # [id number])              Co-Surgeons:  Coralie Common, MD and [name], MD   Anesthesiologist:                  Suann Larry, MD   Echocardiographer:              Jenkins Rouge, MD   Pre-operative Echo Findings: Severe aortic stenosis Mild left ventricular systolic dysfunction   Post-operative Echo Findings: No paravalvular leak unchanged left ventricular systolic function  Echocardiogram 03/17/22: Pending.   Patient Profile     NICHOLUS FERGUS is a 72 y.o. 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   Assessment & Plan    Severe AS: s/p successful TAVR with a 26 mm Edwards Sapien 3 THV via the TF approach on 03/16/22. Post operative echo pending. Groin sites are stable. BP's soft overnight. Hold antihypertensives Appears a bit volume overloaded on exam. Will given IV Lasix today and follow. Continue Plavix monotherapy.   Paroxysmal atrial fibrillation: s/p LAAO with 24m Watchman device on 12/17/21. No longer on Eliquis. Continue Plavix monotherapy through 6 months (06/17/22) post implant.     Pulmonary fibrosis with hypoxia: Trouble with hypoxia post TVAR with known ILD. No requiring NRB with saturations in the 90-93% range. Continue current regimen. Has home O2 if needed at dc. Attempt to ambulate today. Order IS.  Follows with pulmonary and prescribed Esbriet.    Acute on Chronic diastolic HF: Appears volume overloaded on exam today. Will given IV Lasix and follow I&O.   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    Elevated RAISE score: Amyloid screen noted to be 2 therefore will order cardiac amyloid labs while inpatient. Likely these will not result until after discharge. Will review and may refer for PYP scanning.    Signed, JKathyrn Drown NP  03/17/2022, 8:46 AM  Pager [phone]  Patient seen, examined. Available data reviewed. Agree with findings, assessment, and plan as outlined by JKathyrn Drown NP.  The patient is independently interviewed and examined.  He complains of shortness of breath this morning.  Otherwise appears comfortable.  No chest pain.  On exam, he is alert, oriented, in no distress.  The patient is on facemask for oxygen.  HEENT is normal, JVP is normal, lungs with inspiratory crackles bilaterally, heart is regular rate and rhythm with a soft ejection murmur at the right upper sternal border, no diastolic murmur, abdomen is soft and nontender with no organomegaly, bilateral groin sites are clear with no hematoma or ecchymoses, lower extremities have 1+ pretibial and ankle edema which is chronic.  The patient has a chronic interstitial lung disease and typically runs an oxygen saturation of 87 to 90% off of O2 and in the low 90s on supplemental O2.  He may be volume overloaded postoperatively and we will give him furosemide 40 mg IV x 1 now.  Will check a chest x-ray this morning.  Will wean him off of his facemask and back onto oxygen per nasal cannula with a goal oxygen saturation of 90%.  Will review his echo when completed.  Otherwise continue current medical therapy as above.  MSherren Mocha M.D. 03/17/2022 10:41 AM

## 2022-03-17 NOTE — Progress Notes (Signed)
Received call from CCMD that patient had 2.10 pause at 0019. Patient resting. Will continue to monitor

## 2022-03-17 NOTE — Progress Notes (Addendum)
Attempted to decrease patient's O2 to 10L non-rebreather. Patient's sats dropped to 91% from 93-94% previously. Oxygen back to 15L non-rebreather.   Will continue to monitor

## 2022-03-18 DIAGNOSIS — I5023 Acute on chronic systolic (congestive) heart failure: Secondary | ICD-10-CM | POA: Diagnosis not present

## 2022-03-18 LAB — CBC
HCT: 33.6 % — ABNORMAL LOW (ref 39.0–52.0)
Hemoglobin: 10.8 g/dL — ABNORMAL LOW (ref 13.0–17.0)
MCH: 30.5 pg (ref 26.0–34.0)
MCHC: 32.1 g/dL (ref 30.0–36.0)
MCV: 94.9 fL (ref 80.0–100.0)
Platelets: 47 10*3/uL — ABNORMAL LOW (ref 150–400)
RBC: 3.54 MIL/uL — ABNORMAL LOW (ref 4.22–5.81)
RDW: 17.4 % — ABNORMAL HIGH (ref 11.5–15.5)
WBC: 4.7 10*3/uL (ref 4.0–10.5)
nRBC: 0 % (ref 0.0–0.2)

## 2022-03-18 LAB — BASIC METABOLIC PANEL
Anion gap: 9 (ref 5–15)
BUN: 18 mg/dL (ref 8–23)
CO2: 21 mmol/L — ABNORMAL LOW (ref 22–32)
Calcium: 8.3 mg/dL — ABNORMAL LOW (ref 8.9–10.3)
Chloride: 108 mmol/L (ref 98–111)
Creatinine, Ser: 0.95 mg/dL (ref 0.61–1.24)
GFR, Estimated: >60 mL/min (ref >60)
Glucose, Bld: 118 mg/dL — ABNORMAL HIGH (ref 70–99)
Potassium: 3.8 mmol/L (ref 3.5–5.1)
Sodium: 138 mmol/L (ref 135–145)

## 2022-03-18 LAB — MAGNESIUM: Magnesium: 2 mg/dL (ref 1.7–2.4)

## 2022-03-18 MED ORDER — BISACODYL 5 MG PO TBEC
10.0000 mg | DELAYED_RELEASE_TABLET | Freq: Every day | ORAL | Status: DC | PRN
  Administered 2022-03-18: 10 mg via ORAL
  Filled 2022-03-18: qty 2

## 2022-03-18 MED ORDER — ORAL CARE MOUTH RINSE: 15.0000 mL | OROMUCOSAL | Status: DC | PRN

## 2022-03-18 MED ORDER — FUROSEMIDE 10 MG/ML IJ SOLN
80.0000 mg | Freq: Once | INTRAMUSCULAR | Status: AC
  Administered 2022-03-18: 80 mg via INTRAVENOUS
  Filled 2022-03-18: qty 8

## 2022-03-18 MED ORDER — ORAL CARE MOUTH RINSE
15.0000 mL | OROMUCOSAL | Status: DC
  Administered 2022-03-18 – 2022-03-21 (×13): 15 mL via OROMUCOSAL

## 2022-03-18 MED ORDER — CARVEDILOL 3.125 MG PO TABS
3.1250 mg | ORAL_TABLET | Freq: Two times a day (BID) | ORAL | Status: DC
  Administered 2022-03-18 – 2022-03-21 (×7): 3.125 mg via ORAL
  Filled 2022-03-18 (×7): qty 1

## 2022-03-18 MED ORDER — SALINE SPRAY 0.65 % NA SOLN
1.0000 | NASAL | Status: DC | PRN
  Administered 2022-03-18: 1 via NASAL
  Filled 2022-03-18: qty 44

## 2022-03-18 MED ORDER — MELATONIN 3 MG PO TABS
3.0000 mg | ORAL_TABLET | Freq: Every day | ORAL | Status: DC
  Administered 2022-03-18 – 2022-03-20 (×3): 3 mg via ORAL
  Filled 2022-03-18 (×3): qty 1

## 2022-03-18 MED FILL — Nitroglycerin IV Soln 200 MCG/ML in D5W: INTRAVENOUS | Qty: 250 | Status: AC

## 2022-03-18 NOTE — Progress Notes (Signed)
Pt SpO2 down in the mid to upper 80s this afternoon.  Not with any more difficulty or discomfort of breathing, but with significant complaints of headache and dry/bleeding nose after requiring 15L HFNC since early this morning.  Saline mist provided and also paged respiratory therapy to come assess and make recommendations.  Dr. Burt Knack notified as well, decision to start heated HF and also consult pulmonology.

## 2022-03-18 NOTE — Progress Notes (Signed)
CARDIAC REHAB PHASE I    Pt resting in bed, reports feeling rough this morning. However, he reports breathing has improved. IS instructions provided. Pt able to reach 1000 x2. Encouraged use 5-10 times per hour while awake as tolerated. Will continue to follow.   [id number]    Vanessa Barbara, RN BSN 03/18/2022 12:07 PM

## 2022-03-18 NOTE — Progress Notes (Signed)
Mobility Specialist Progress Note   03/18/22 1140  Mobility  Activity Contraindicated/medical hold   Patient not appropriate for ambulation at this time given level of complexity, physical assist, and/or precautions as advised by RN. Currently on 15L HFNC and hypoxic with conversation. MS to hold and continue to follow.  [place] Maurya Nethery, BS EXP Mobility Specialist Please contact via SecureChat or Rehab office at [phone]

## 2022-03-18 NOTE — Progress Notes (Addendum)
[place] VALVE TEAM  Patient Name: MALEKHI ZODROW Date of Encounter: 03/18/2022  Primary Cardiologist:  Dr. Stanford Breed, MD/ Dr. Burt Knack and Dr. Lavonna Monarch (TAVR)   Hospital Problem List     Principal Problem:   Severe aortic stenosis Active Problems:   Dyslipidemia   Essential hypertension   Coronary atherosclerosis   Chronic combined systolic and diastolic heart failure (HCC)   Atypical atrial flutter (HCC)   Atrial flutter (HCC)   Presence of Watchman left atrial appendage closure device   S/P TAVR (transcatheter aortic valve replacement)   Acute on chronic HFrEF (heart failure with reduced ejection fraction) (HCC)   Subjective   Feeling a little better today. Still on 15L [place]. No chest pain or SOB.   Inpatient Medications    Scheduled Meds:  atorvastatin  80 mg Oral Daily   clopidogrel  75 mg Oral Daily   mouth rinse  15 mL Mouth Rinse 4 times per day   Pirfenidone  801 mg Oral TID WC   sodium chloride flush  3 mL Intravenous Q12H   tamsulosin  0.4 mg Oral QPC supper   Continuous Infusions:  sodium chloride     sodium chloride     nitroGLYCERIN     PRN Meds: sodium chloride, acetaminophen **OR** acetaminophen, morphine injection, ondansetron (ZOFRAN) IV, mouth rinse, oxyCODONE, sodium chloride flush, traMADol   Vital Signs    Vitals:   03/18/22 0401 03/18/22 0434 03/18/22 0500 03/18/22 0600  BP: (!) 132/51  (!) 135/56 (!) 139/51  Pulse: 70  70 71  Resp: 20  (!) 21 (!) 25  Temp: 98.3 F (36.8 C)     TempSrc: Oral     SpO2: 90% (S) (!) 88% 90% (S) (!) 89%  Weight: 97.5 kg     Height:        Intake/Output Summary (Last 24 hours) at 03/18/2022 0810 Last data filed at 03/18/2022 0512 Gross per 24 hour  Intake 250 ml  Output 700 ml  Net -450 ml   Filed Weights   03/16/22 0935 03/17/22 0500 03/18/22 0401  Weight: 94.3 kg 97.8 kg 97.5 kg   Physical Exam    General: Well developed, well nourished,  NAD Lungs:Clear to ausculation bilaterally. No wheezes, rales, or rhonchi. Breathing is unlabored. Cardiovascular: RRR with S1 S2. + murmurs Abdomen: Soft, non-tender, non-distended. No rebound/guarding. No obvious abdominal masses. Extremities: 2+ R>L edema.  Neuro: Alert and oriented. No focal deficits. No facial asymmetry. MAE spontaneously. Psych: Responds to questions appropriately with normal affect.    Labs    CBC Recent Labs    03/17/22 0118 03/18/22 0144  WBC 5.4 4.7  HGB 10.4* 10.8*  HCT 31.2* 33.6*  MCV 94.3 94.9  PLT 54* 47*   Basic Metabolic Panel Recent Labs    03/17/22 0118 03/18/22 0144  NA 137 138  K 4.4 3.8  CL 110 108  CO2 19* 21*  GLUCOSE 109* 118*  BUN 17 18  CREATININE 1.20 0.95  CALCIUM 8.1* 8.3*  MG 1.9  --    Liver Function Tests No results for input(s): "AST", "ALT", "ALKPHOS", "BILITOT", "PROT", "ALBUMIN" in the last 72 hours. No results for input(s): "LIPASE", "AMYLASE" in the last 72 hours. Cardiac Enzymes No results for input(s): "CKTOTAL", "CKMB", "CKMBINDEX", "TROPONINI" in the last 72 hours. BNP Invalid input(s): "POCBNP" D-Dimer No results for input(s): "DDIMER" in the last 72 hours. Hemoglobin A1C No results for input(s): "HGBA1C" in the  last 72 hours. Fasting Lipid Panel No results for input(s): "CHOL", "HDL", "LDLCALC", "TRIG", "CHOLHDL", "LDLDIRECT" in the last 72 hours. Thyroid Function Tests No results for input(s): "TSH", "T4TOTAL", "T3FREE", "THYROIDAB" in the last 72 hours.  Invalid input(s): "FREET3"  Telemetry    NSR, HR 60-70 - Personally Reviewed  ECG    No new tracing as of 03/18/22 - Personally Reviewed  Radiology    ECHOCARDIOGRAM COMPLETE  Result Date: 03/17/2022    ECHOCARDIOGRAM REPORT   Patient Name:   LANG LOPEZRODRIGUEZ [facility] Date of Exam: 03/17/2022 Medical Rec #:  [id number]        Height:       66.0 in Accession #:    [id number]       Weight:       215.6 lb Date of Birth:  1950/10/28        BSA:           2.065 m Patient Age:    72 years         BP:           109/45 mmHg Patient Gender: M                HR:           63 bpm. Exam Location:  Inpatient Procedure: 2D Echo, Cardiac Doppler, Color Doppler and Intracardiac            Opacification Agent Indications:    Post TAVR evaluation  History:        Patient has prior history of Echocardiogram examinations, most                 recent 03/16/2022. CHF, Previous Myocardial Infarction and CAD,                 Aortic Valve Disease; Risk Factors:Sleep Apnea, Hypertension and                 Dyslipidemia. CKD.                 Aortic Valve: 26 mm Edwards Ultra, stented (TAVR) valve is                 present in the aortic position. Procedure Date: 03/16/22.  Sonographer:    Eartha Inch Referring Phys: [phone] JILL D MCDANIEL  Sonographer Comments: Technically difficult study due to poor echo windows. Image acquisition challenging due to patient body habitus and Image acquisition challenging due to respiratory motion. IMPRESSIONS  1. Left ventricular ejection fraction, by estimation, is 45 to 50%. The left ventricle has mildly decreased function. The left ventricle demonstrates global hypokinesis. Left ventricular diastolic function could not be evaluated. Elevated left ventricular end-diastolic pressure.  2. Right ventricular systolic function is normal. The right ventricular size is normal. There is normal pulmonary artery systolic pressure. The estimated right ventricular systolic pressure is 123456 mmHg.  3. Left atrial size was moderately dilated.  4. The mitral valve is degenerative. Trivial mitral valve regurgitation. No evidence of mitral stenosis. Moderate mitral annular calcification.  5. The aortic valve has been repaired/replaced. Aortic valve regurgitation is not visualized. No aortic stenosis is present. There is a 26 mm Edwards Ultra, stented (TAVR) valve present in the aortic position. Procedure Date: 03/16/22. Aortic valve area,  by VTI measures 2.12 cm.  Aortic valve mean gradient measures 15.5 mmHg. Aortic valve Vmax measures 2.60 m/s.  6. The inferior vena cava is dilated in size with <50% respiratory variability, suggesting right  atrial pressure of 15 mmHg.  7. Compared to immdediate post TAVR study 03/16/22, the mean TAVR gradient has increased from 65mHg to 151m, Vmax has increased from 2.2 to 2.19m35mand DVI has increased from 0.57 to 0.67. FINDINGS  Left Ventricle: Left ventricular ejection fraction, by estimation, is 45 to 50%. The left ventricle has mildly decreased function. The left ventricle demonstrates global hypokinesis. Definity contrast agent was given IV to delineate the left ventricular  endocardial borders. The left ventricular internal cavity size was normal in size. There is no left ventricular hypertrophy. Left ventricular diastolic function could not be evaluated. Elevated left ventricular end-diastolic pressure. Right Ventricle: The right ventricular size is normal. No increase in right ventricular wall thickness. Right ventricular systolic function is normal. There is normal pulmonary artery systolic pressure. The tricuspid regurgitant velocity is 2.12 m/s, and  with an assumed right atrial pressure of 15 mmHg, the estimated right ventricular systolic pressure is 33.123456Hg. Left Atrium: Left atrial size was moderately dilated. Right Atrium: Right atrial size was normal in size. Pericardium: There is no evidence of pericardial effusion. Mitral Valve: The mitral valve is degenerative in appearance. There is mild thickening of the mitral valve leaflet(s). Moderate mitral annular calcification. Trivial mitral valve regurgitation. No evidence of mitral valve stenosis. MV peak gradient, 11.4  mmHg. The mean mitral valve gradient is 4.0 mmHg. Tricuspid Valve: The tricuspid valve is normal in structure. Tricuspid valve regurgitation is mild . No evidence of tricuspid stenosis. Aortic Valve: The aortic valve has been repaired/replaced. Aortic valve  regurgitation is not visualized. No aortic stenosis is present. Aortic valve mean gradient measures 15.5 mmHg. Aortic valve peak gradient measures 26.9 mmHg. Aortic valve area, by VTI measures 2.12 cm. There is a 26 mm Edwards Ultra, stented (TAVR) valve present in the aortic position. Procedure Date: 03/16/22. Pulmonic Valve: The pulmonic valve was normal in structure. Pulmonic valve regurgitation is not visualized. No evidence of pulmonic stenosis. Aorta: The aortic root is normal in size and structure. Venous: The inferior vena cava is dilated in size with less than 50% respiratory variability, suggesting right atrial pressure of 15 mmHg. IAS/Shunts: No atrial level shunt detected by color flow Doppler.  LEFT VENTRICLE PLAX 2D LVIDd:         4.80 cm      Diastology LVIDs:         4.00 cm      LV e' medial:    4.12 cm/s LV PW:         1.00 cm      LV E/e' medial:  37.5 LV IVS:        0.90 cm      LV e' lateral:   4.67 cm/s LVOT diam:     2.00 cm      LV E/e' lateral: 33.1 LV SV:         134 LV SV Index:   65 LVOT Area:     3.14 cm  LV Volumes (MOD) LV vol d, MOD A2C: 126.0 ml LV vol d, MOD A4C: 224.0 ml LV vol s, MOD A2C: 62.6 ml LV vol s, MOD A4C: 115.0 ml LV SV MOD A2C:     63.4 ml LV SV MOD A4C:     224.0 ml LV SV MOD BP:      81.9 ml RIGHT VENTRICLE            IVC RV S prime:     8.38 cm/s  IVC diam: 3.20  cm TAPSE (M-mode): 1.4 cm LEFT ATRIUM              Index        RIGHT ATRIUM           Index LA diam:        5.40 cm  2.62 cm/m   RA Area:     15.90 cm LA Vol (A2C):   81.8 ml  39.62 ml/m  RA Volume:   38.60 ml  18.70 ml/m LA Vol (A4C):   103.0 ml 49.89 ml/m LA Biplane Vol: 91.8 ml  44.46 ml/m  AORTIC VALVE AV Area (Vmax):    2.00 cm AV Area (Vmean):   2.10 cm AV Area (VTI):     2.12 cm AV Vmax:           259.50 cm/s AV Vmean:          185.500 cm/s AV VTI:            0.635 m AV Peak Grad:      26.9 mmHg AV Mean Grad:      15.5 mmHg LVOT Vmax:         165.00 cm/s LVOT Vmean:        124.000 cm/s LVOT  VTI:          0.428 m LVOT/AV VTI ratio: 0.67  AORTA Ao Root diam: 2.50 cm Ao Asc diam:  3.50 cm MITRAL VALVE                TRICUSPID VALVE MV Area (PHT): 2.33 cm     TR Peak grad:   18.0 mmHg MV Area VTI:   2.55 cm     TR Mean grad:   14.0 mmHg MV Peak grad:  11.4 mmHg    TR Vmax:        212.00 cm/s MV Mean grad:  4.0 mmHg     TR Vmean:       180.0 cm/s MV Vmax:       1.69 m/s MV Vmean:      89.7 cm/s    SHUNTS MV Decel Time: 326 msec     Systemic VTI:  0.43 m MR Peak grad: 18.5 mmHg     Systemic Diam: 2.00 cm MR Mean grad: 11.0 mmHg MR Vmax:      215.00 cm/s MR Vmean:     151.0 cm/s MV E velocity: 154.50 cm/s Fransico Him MD Electronically signed by Fransico Him MD Signature Date/Time: 03/17/2022/12:51:20 PM    Final    DG CHEST PORT 1 VIEW  Result Date: 03/17/2022 CLINICAL DATA:  Hypoxia EXAM: PORTABLE CHEST 1 VIEW COMPARISON:  CT chest dated 02/24/2022 FINDINGS: Mild subpleural scarring/fibrosis in the lung bases, favoring chronic interstitial lung disease. No focal consolidation. No pleural effusion or pneumothorax. Cardiomegaly.  Postsurgical changes related to prior CABG. Median sternotomy. IMPRESSION: No evidence of acute cardiopulmonary disease. Mild chronic interstitial lung disease. Electronically Signed   By: Julian Hy M.D.   On: 03/17/2022 12:15   ECHOCARDIOGRAM LIMITED  Result Date: 03/16/2022    ECHOCARDIOGRAM LIMITED REPORT   Patient Name:   JARQUIS REICHL Date of Exam: 03/16/2022 Medical Rec #:  [id number]        Height:       66.0 in Accession #:    [id number]       Weight:       212.0 lb Date of Birth:  Apr 29, 1950        BSA:  2.050 m Patient Age:    61 years         BP:           132/57 mmHg Patient Gender: M                HR:           68 bpm. Exam Location:  Inpatient Procedure: Limited Echo, Cardiac Doppler and Color Doppler Indications:     I35.0 Nonrheumatic aortic (valve) stenosis  History:         Patient has prior history of Echocardiogram examinations, most                   recent 01/19/2022. Previous Myocardial Infarction, Abnormal                  ECG, Aortic Valve Disease, Arrythmias:Atrial Fibrillation and                  Atrial Flutter, Signs/Symptoms:Dyspnea and Shortness of Breath;                  Risk Factors:Hypertension and Dyslipidemia. Aortic stenosis.                  Watchman device.                  Aortic Valve: 26 mm Sapien prosthetic, stented (TAVR) valve is                  present in the aortic position. Procedure Date: 03/16/2022.  Sonographer:     Roseanna Rainbow RDCS Referring Phys:  [name] Diagnosing Phys: Jenkins Rouge MD IMPRESSIONS  1. Septal apical and inferior wall hypokinesis . Left ventricular ejection fraction, by estimation, is 45 to 50%. The left ventricle has mildly decreased function. The left ventricle demonstrates regional wall motion abnormalities (see scoring diagram/findings for description). The left ventricular internal cavity size was mildly dilated.  2. The mitral valve is abnormal. Trivial mitral valve regurgitation. Moderate mitral annular calcification.  3. Pre TAVR tri leaflet AV with severe AS mean gradient 27 peak 46 mmHg AVA 1.6 cm2 supine in cath lab         Post TAVR: well place 26 mm Sapien 3 valve with no significant PVL mean gradient 11 peak 20 mmHg AVI 3.1 cm2. The aortic valve has been repaired/replaced. There is a 26 mm Sapien prosthetic (TAVR) valve present in the aortic position. Procedure Date:  03/16/2022. FINDINGS  Left Ventricle: Septal apical and inferior wall hypokinesis. Left ventricular ejection fraction, by estimation, is 45 to 50%. The left ventricle has mildly decreased function. The left ventricle demonstrates regional wall motion abnormalities. The left ventricular internal cavity size was mildly dilated. Mitral Valve: The mitral valve is abnormal. There is mild thickening of the mitral valve leaflet(s). There is mild calcification of the mitral valve leaflet(s). Moderate mitral annular  calcification. Trivial mitral valve regurgitation. Tricuspid Valve: The tricuspid valve is normal in structure. Tricuspid valve regurgitation is mild. Aortic Valve: Pre TAVR tri leaflet AV with severe AS mean gradient 27 peak 46 mmHg AVA 1.6 cm2 supine in cath lab Post TAVR: well place 26 mm Sapien 3 valve with no significant PVL mean gradient 11 peak 20 mmHg AVI 3.1 cm2. The aortic valve has been repaired/replaced. Aortic valve mean gradient measures 11.0 mmHg. Aortic valve peak gradient measures 19.7 mmHg. Aortic valve area, by VTI measures 3.05 cm. There is a 26 mm Sapien prosthetic, stented (  TAVR) valve present in the aortic position. Procedure Date: 03/16/2022. Additional Comments: Spectral Doppler performed. Color Doppler performed.  LEFT VENTRICLE PLAX 2D LVIDd:         5.40 cm LVIDs:         3.90 cm LV PW:         1.00 cm LV IVS:        1.30 cm LVOT diam:     2.60 cm LV SV:         157 LV SV Index:   77 LVOT Area:     5.31 cm  LV Volumes (MOD) LV vol d, MOD A2C: 164.0 ml LV vol d, MOD A4C: 197.0 ml LV vol s, MOD A2C: 78.5 ml LV vol s, MOD A4C: 79.9 ml LV SV MOD A2C:     85.5 ml LV SV MOD A4C:     197.0 ml LV SV MOD BP:      106.3 ml AORTIC VALVE AV Area (Vmax):    3.20 cm AV Area (Vmean):   3.12 cm AV Area (VTI):     3.05 cm AV Vmax:           222.00 cm/s AV Vmean:          151.500 cm/s AV VTI:            0.516 m AV Peak Grad:      19.7 mmHg AV Mean Grad:      11.0 mmHg LVOT Vmax:         134.00 cm/s LVOT Vmean:        89.100 cm/s LVOT VTI:          0.296 m LVOT/AV VTI ratio: 0.57  SHUNTS Systemic VTI:  0.30 m Systemic Diam: 2.60 cm Jenkins Rouge MD Electronically signed by Jenkins Rouge MD Signature Date/Time: 03/16/2022/3:38:34 PM    Final    Structural Heart Procedure  Result Date: 03/16/2022 See surgical note for result.   Cardiac Studies     TAVR OPERATIVE NOTE     Date of Procedure:                03/16/2022   Preoperative Diagnosis:      Severe Aortic Stenosis    Postoperative  Diagnosis:    Same    Procedure:        Transcatheter Aortic Valve Replacement - Percutaneous  Transfemoral Approach             Edwards Sapien 3 Ultra Resilia THV (size 26 mm, serial # [id number])              Co-Surgeons:                        Coralie Common, MD and [name], MD   Anesthesiologist:                  Suann Larry, MD   Echocardiographer:              Jenkins Rouge, MD   Pre-operative Echo Findings: Severe aortic stenosis Mild left ventricular systolic dysfunction   Post-operative Echo Findings: No paravalvular leak unchanged left ventricular systolic function   Echocardiogram 03/17/22:    1. Left ventricular ejection fraction, by estimation, is 45 to 50%. The  left ventricle has mildly decreased function. The left ventricle  demonstrates global hypokinesis. Left ventricular diastolic function could  not be evaluated. Elevated left  ventricular end-diastolic pressure.   2. Right ventricular  systolic function is normal. The right ventricular  size is normal. There is normal pulmonary artery systolic pressure. The  estimated right ventricular systolic pressure is 123456 mmHg.   3. Left atrial size was moderately dilated.   4. The mitral valve is degenerative. Trivial mitral valve regurgitation.  No evidence of mitral stenosis. Moderate mitral annular calcification.   5. The aortic valve has been repaired/replaced. Aortic valve  regurgitation is not visualized. No aortic stenosis is present. There is a  26 mm Edwards Ultra, stented (TAVR) valve present in the aortic position.  Procedure Date: 03/16/22. Aortic valve area,   by VTI measures 2.12 cm. Aortic valve mean gradient measures 15.5 mmHg.  Aortic valve Vmax measures 2.60 m/s.   6. The inferior vena cava is dilated in size with <50% respiratory  variability, suggesting right atrial pressure of 15 mmHg.   7. Compared to immdediate post TAVR study 03/16/22, the mean TAVR gradient  has increased from 2mHg  to 182m, Vmax has increased from 2.2 to 2.30m20m and DVI has increased from 0.57 to 0.67.   Patient Profile     JimTIFFANY PERRA a 71 30o. male with a PAF s/p LAAO ([id number] cirrhosis with splenomegaly complicated by thrombocytopenia and GI bleeding, portal venous hypertension, ILD, CAD s/p CABG, and severe aortic stenosis [facility]    Assessment & Plan    Severe AS: s/p successful TAVR with a 26 mm Edwards Sapien 3 THV via the TF approach on 03/16/22. Post TAVR echo with LVEF at 45-50% with mild hypokinesis. Mean gradient has increased from 11>>73m80mfrom immediate post TAVR echo to POD 1 with DVI from 0.57>>0.67 Continues to be volume overloaded. Will treat with IV Lasix 80mg2may and follow. Continue Plavix monotherapy.  Paroxysmal atrial fibrillation: s/p LAAO with 27mm 54mhman device on 12/17/21. No longer on Eliquis. Continue Plavix monotherapy through 6 months (06/17/22) post implant.     Pulmonary fibrosis with hypoxia: Trouble with hypoxia post TVAR with known ILD. Weaned off NRB>>>15 [place]. [facility] has  tried to contact many times with no response from home.  Goal range 88-90%. Order IS. Follows with pulmonary and prescribed Esbriet.    Acute on Chronic diastolic HF: Appears volume overloaded on exam today. Will given IV Lasix 80mg t530m and follow response. Reports only intermittently taking Lasix at home due to frequent urination. I&O. Net negative 304ml to35m Weight 214  [id number] cirrhosis with splenomegaly and portal venous hypertension: Followed closely by GI and felt to be high risk for long term anticoagulation and is now s/p LAAO closure with Watchman device.    Elevated RAISE score: Amyloid screen noted to be 2 therefore will order cardiac amyloid labs while inpatient. Likely these will not result until after discharge. Will review and may refer for PYP scanning.   Signed, Jill  McKathyrn Drown15/2024, 8:10 AM  Pager [phone] seen, examined. Available data reviewed. Agree with findings, assessment, and plan as outlined by Jill McDKathyrn Drownn exam, the patient is alert, oriented, in no distress.  HEENT is normal, JVP is normal, lungs inspiratory crackles in the bases and lower lung fields bilaterally, with heart regular rate rhythm 2/6 systolic ejection murmur at the right upper sternal border, abdomen soft and nontender, extremities with 2+ edema on the right and 1+ edema on the left lower extremity.  Skin is warm and dry with  some chronic stasis changes.  The patient's postoperative day #1 echo is reviewed and demonstrates mild LV dysfunction with LVEF 45 to 50%, normal TAVR bioprosthetic function with a mean gradient of 15 mmHg and no paravalvular regurgitation.  The patient has acute on chronic heart failure with reduced ejection fraction and acute on chronic respiratory failure, currently on high flow oxygen per nasal cannula.  His home oxygen saturations off of O2 run between 87 to 90% in the setting of his interstitial lung disease.  Chest x-ray yesterday showed no evidence of interstitial edema or heart failure.  However, I suspect that volume overload and elevated filling pressures are exacerbating his hypoxemia and shortness of breath.  Will diurese him today with furosemide 80 mg IV and follow his response.  Likely will give a second dose late this afternoon.  Worsening thrombocytopenia noted with platelet count 47,000 this morning.  This is not uncommon after TAVR, and this patient has a baseline platelet count less than 100,000 in the setting of nonalcoholic cirrhosis.  Will repeat a CBC tomorrow.  Add SCD's for DVT prophylaxis.  Otherwise continue current medicines as outlined above.  Follow-up tomorrow morning  [name], M.D. 03/18/2022 9:44 AM

## 2022-03-18 NOTE — Progress Notes (Signed)
SPO2 86-88% on HFNCL 13 LPM. RR 26-29. We increased to 15 LPM. Will monitor.   Kennyth Lose, RN

## 2022-03-18 NOTE — Progress Notes (Signed)
Pt is alert, fully oriented x 4, stable hemodynamically, sinus rhythm with 1st degree AVB on the monitor, afebrile, no acute distress noted tonight. He is continue on 13 LPM of HFNCL, SPO2 remains 88- 92%. From observing, SPO2 gets lower when he speaks. Denies SOB. No productive cough.   Right and left groin are soft, negative for active bleeding or hematoma, both sites dressing with old minimal drainage. New dressing changed. Pitting edema 3+ on bilateral lower extremities. Strong PT and DP pulses on palpations. We will continue to monitor.   Kennyth Lose, RN

## 2022-03-18 NOTE — Progress Notes (Signed)
  CCM consulted on pt with decreasing spo2 with pulmonary fibrosis.   Once heat added to high flow 02 his sp02 improved as well.   Cecilie Kicks, FNP-C [facility] [place]  IDC:[phone] or after 5pm and on weekends call [phone] 03/18/2022.  [name]

## 2022-03-19 DIAGNOSIS — I350 Nonrheumatic aortic (valve) stenosis: Secondary | ICD-10-CM | POA: Diagnosis not present

## 2022-03-19 DIAGNOSIS — J849 Interstitial pulmonary disease, unspecified: Secondary | ICD-10-CM | POA: Insufficient documentation

## 2022-03-19 DIAGNOSIS — J9611 Chronic respiratory failure with hypoxia: Secondary | ICD-10-CM | POA: Insufficient documentation

## 2022-03-19 DIAGNOSIS — I5023 Acute on chronic systolic (congestive) heart failure: Secondary | ICD-10-CM | POA: Diagnosis not present

## 2022-03-19 LAB — CBC
HCT: 32 % — ABNORMAL LOW (ref 39.0–52.0)
Hemoglobin: 10.8 g/dL — ABNORMAL LOW (ref 13.0–17.0)
MCH: 31.6 pg (ref 26.0–34.0)
MCHC: 33.8 g/dL (ref 30.0–36.0)
MCV: 93.6 fL (ref 80.0–100.0)
Platelets: 45 10*3/uL — ABNORMAL LOW (ref 150–400)
RBC: 3.42 MIL/uL — ABNORMAL LOW (ref 4.22–5.81)
RDW: 17.2 % — ABNORMAL HIGH (ref 11.5–15.5)
WBC: 4.6 10*3/uL (ref 4.0–10.5)
nRBC: 0 % (ref 0.0–0.2)

## 2022-03-19 LAB — BASIC METABOLIC PANEL
Anion gap: 9 (ref 5–15)
BUN: 14 mg/dL (ref 8–23)
CO2: 23 mmol/L (ref 22–32)
Calcium: 8.5 mg/dL — ABNORMAL LOW (ref 8.9–10.3)
Chloride: 108 mmol/L (ref 98–111)
Creatinine, Ser: 0.82 mg/dL (ref 0.61–1.24)
GFR, Estimated: >60 mL/min (ref >60)
Glucose, Bld: 109 mg/dL — ABNORMAL HIGH (ref 70–99)
Potassium: 3.5 mmol/L (ref 3.5–5.1)
Sodium: 140 mmol/L (ref 135–145)

## 2022-03-19 MED ORDER — METHYLPREDNISOLONE SODIUM SUCC 40 MG IJ SOLR
40.0000 mg | Freq: Two times a day (BID) | INTRAMUSCULAR | Status: DC
  Administered 2022-03-19 – 2022-03-21 (×4): 40 mg via INTRAVENOUS
  Filled 2022-03-19 (×4): qty 1

## 2022-03-19 MED ORDER — FUROSEMIDE 10 MG/ML IJ SOLN
80.0000 mg | Freq: Once | INTRAMUSCULAR | Status: AC
  Administered 2022-03-19: 80 mg via INTRAVENOUS
  Filled 2022-03-19: qty 8

## 2022-03-19 MED ORDER — ASPIRIN 81 MG PO TBEC
81.0000 mg | DELAYED_RELEASE_TABLET | Freq: Every day | ORAL | Status: DC
  Administered 2022-03-20 – 2022-03-21 (×2): 81 mg via ORAL
  Filled 2022-03-19 (×2): qty 1

## 2022-03-19 NOTE — Progress Notes (Signed)
SpO2 holding around 93% on 20L heated HFNC.  Weaned down to 15L, SpO2 fluctuating between 86-89%.

## 2022-03-19 NOTE — Progress Notes (Signed)
Dr. Grayce Sessions was notified per Pt requesting sleeping aid medication. Pt stated he was unable to sleep well in the past two nights. Melatonin was given to Pt.  He was able to rest, feel more comfortable, sleep well tonight with no acute distress after melatonin given.    Pt was on heated HFNCL at 30-35 LPM earlier of night shift, then closely monitoring weaning down to 20 LPM by RT. Pt has well tolerated with no SOB. SPO2 remained on 89-94%. He is stable hemodynamically, afebrile, NSR on the monitor.   Pt was encouraged to be effectively using incentive spirometer. He has done and tolerated well at level at 1100 ml x 10 q 1 hr.   Per Pt reported his baseline at home on day time with room air SPO2 was 87-89% and he did not use O2 all day on day time.  Pt only used 2 LPM of O2 NCL at home as needed on night time and his baseline SPO2 with 2 LPM at home was 90-91% max.     Right and left groin have no bleeding or hematoma. Good dorsalis pedal pulses with palpations. We will continue to monitor.  Kennyth Lose, RN

## 2022-03-19 NOTE — Progress Notes (Addendum)
[name]  Patient Name: [name] Date of Encounter: 03/19/2022  Primary Cardiologist:  Dr. Stanford Breed, MD/ Dr. Burt Knack and Dr. Lavonna Monarch (TAVR)    Hospital Problem List     Principal Problem:   Severe aortic stenosis Active Problems:   Dyslipidemia   Essential hypertension   Coronary atherosclerosis   Chronic combined systolic and diastolic heart failure (HCC)   Atypical atrial flutter (HCC)   Atrial flutter (HCC)   Presence of Watchman left atrial appendage closure device   S/P TAVR (transcatheter aortic valve replacement)   Acute on chronic HFrEF (heart failure with reduced ejection fraction) ([place])   Subjective   Continued on HFNC overnight. Pulmonary consult appreciated.  No chest pain, SOB. Aggressive diuresis overnight.   Inpatient Medications    Scheduled Meds:  atorvastatin  80 mg Oral Daily   carvedilol  3.125 mg Oral BID WC   clopidogrel  75 mg Oral Daily   melatonin  3 mg Oral QHS   mouth rinse  15 mL Mouth Rinse 4 times per day   sodium chloride flush  3 mL Intravenous Q12H   tamsulosin  0.4 mg Oral QPC supper   Continuous Infusions:  sodium chloride     sodium chloride     nitroGLYCERIN     PRN Meds: sodium chloride, acetaminophen **OR** acetaminophen, bisacodyl, morphine injection, ondansetron (ZOFRAN) IV, mouth rinse, oxyCODONE, sodium chloride, sodium chloride flush, traMADol   Vital Signs    Vitals:   03/18/22 2122 03/18/22 2318 03/19/22 0130 03/19/22 0316  BP:  (!) 126/49  (!) 115/44  Pulse:  60  60  Resp:  20  20  Temp:  98.5 F (36.9 C)  98.2 F (36.8 C)  TempSrc:  Oral  Oral  SpO2: 94% 91% 92% 90%  Weight:    95.4 kg  Height:        Intake/Output Summary (Last 24 hours) at 03/19/2022 0657 Last data filed at 03/19/2022 0407 Gross per 24 hour  Intake 300 ml  Output 3250 ml  Net -2950 ml   Filed Weights   03/17/22 0500 03/18/22 0401 03/19/22 0316  Weight: 97.8 kg 97.5 kg  95.4 kg   Physical Exam    General: Well developed, well nourished, NAD Neck: Negative for carotid bruits. No JVD Lungs:Clear to ausculation bilaterally. Breathing is unlabored. Cardiovascular: RRR with S1 S2. No murmurs Abdomen: Soft, non-tender, non-distended. No obvious abdominal masses. Extremities: R>L BLE however improved.  Neuro: Alert and oriented. No focal deficits. No facial asymmetry. MAE spontaneously. Psych: Responds to questions appropriately with normal affect.    Labs    CBC Recent Labs    03/18/22 0144 03/19/22 0213  WBC 4.7 4.6  HGB 10.8* 10.8*  HCT 33.6* 32.0*  MCV 94.9 93.6  PLT 47* 45*   Basic Metabolic Panel Recent Labs    03/17/22 0118 03/18/22 0144 03/19/22 0213  NA 137 138 140  K 4.4 3.8 3.5  CL 110 108 108  CO2 19* 21* 23  GLUCOSE 109* 118* 109*  BUN 17 18 14  $ CREATININE 1.20 0.95 0.82  CALCIUM 8.1* 8.3* 8.5*  MG 1.9 2.0  --    Liver Function Tests No results for input(s): "AST", "ALT", "ALKPHOS", "BILITOT", "PROT", "ALBUMIN" in the last 72 hours. No results for input(s): "LIPASE", "AMYLASE" in the last 72 hours. Cardiac Enzymes No results for input(s): "CKTOTAL", "CKMB", "CKMBINDEX", "TROPONINI" in the last 72 hours. BNP Invalid input(s): "  POCBNP" D-Dimer No results for input(s): "DDIMER" in the last 72 hours. Hemoglobin A1C No results for input(s): "HGBA1C" in the last 72 hours. Fasting Lipid Panel No results for input(s): "CHOL", "HDL", "LDLCALC", "TRIG", "CHOLHDL", "LDLDIRECT" in the last 72 hours. Thyroid Function Tests No results for input(s): "TSH", "T4TOTAL", "T3FREE", "THYROIDAB" in the last 72 hours.  Invalid input(s): "FREET3"  Telemetry    NSR, 60-70's - Personally Reviewed  ECG    No new tracing as of 03/19/22 - Personally Reviewed  Radiology    ECHOCARDIOGRAM COMPLETE  Result Date: 03/17/2022    ECHOCARDIOGRAM REPORT   Patient Name:   [name] Date of Exam: 03/17/2022 Medical Rec #:  [id number]         Height:       66.0 in Accession #:    [id number]       Weight:       215.6 lb Date of Birth:  01/08/1951        BSA:          2.065 m Patient Age:    72 years         BP:           109/45 mmHg Patient Gender: M                HR:           63 bpm. Exam Location:  Inpatient Procedure: 2D Echo, Cardiac Doppler, Color Doppler and Intracardiac            Opacification Agent Indications:    Post TAVR evaluation  History:        Patient has prior history of Echocardiogram examinations, most                 recent 03/16/2022. CHF, Previous Myocardial Infarction and CAD,                 Aortic Valve Disease; Risk Factors:Sleep Apnea, Hypertension and                 Dyslipidemia. CKD.                 Aortic Valve: 26 mm Edwards Ultra, stented (TAVR) valve is                 present in the aortic position. Procedure Date: 03/16/22.  Sonographer:    Eartha Inch Referring Phys: [phone] JILL D MCDANIEL  Sonographer Comments: Technically difficult study due to poor echo windows. Image acquisition challenging due to patient body habitus and Image acquisition challenging due to respiratory motion. IMPRESSIONS  1. Left ventricular ejection fraction, by estimation, is 45 to 50%. The left ventricle has mildly decreased function. The left ventricle demonstrates global hypokinesis. Left ventricular diastolic function could not be evaluated. Elevated left ventricular end-diastolic pressure.  2. Right ventricular systolic function is normal. The right ventricular size is normal. There is normal pulmonary artery systolic pressure. The estimated right ventricular systolic pressure is 123456 mmHg.  3. Left atrial size was moderately dilated.  4. The mitral valve is degenerative. Trivial mitral valve regurgitation. No evidence of mitral stenosis. Moderate mitral annular calcification.  5. The aortic valve has been repaired/replaced. Aortic valve regurgitation is not visualized. No aortic stenosis is present. There is a 26 mm Edwards Ultra,  stented (TAVR) valve present in the aortic position. Procedure Date: 03/16/22. Aortic valve area,  by VTI measures 2.12 cm. Aortic valve mean gradient measures 15.5 mmHg. Aortic valve  Vmax measures 2.60 m/s.  6. The inferior vena cava is dilated in size with <50% respiratory variability, suggesting right atrial pressure of 15 mmHg.  7. Compared to immdediate post TAVR study 03/16/22, the mean TAVR gradient has increased from 31mHg to 163m, Vmax has increased from 2.2 to 2.66m41mand DVI has increased from 0.57 to 0.67. FINDINGS  Left Ventricle: Left ventricular ejection fraction, by estimation, is 45 to 50%. The left ventricle has mildly decreased function. The left ventricle demonstrates global hypokinesis. Definity contrast agent was given IV to delineate the left ventricular  endocardial borders. The left ventricular internal cavity size was normal in size. There is no left ventricular hypertrophy. Left ventricular diastolic function could not be evaluated. Elevated left ventricular end-diastolic pressure. Right Ventricle: The right ventricular size is normal. No increase in right ventricular wall thickness. Right ventricular systolic function is normal. There is normal pulmonary artery systolic pressure. The tricuspid regurgitant velocity is 2.12 m/s, and  with an assumed right atrial pressure of 15 mmHg, the estimated right ventricular systolic pressure is 33.123456Hg. Left Atrium: Left atrial size was moderately dilated. Right Atrium: Right atrial size was normal in size. Pericardium: There is no evidence of pericardial effusion. Mitral Valve: The mitral valve is degenerative in appearance. There is mild thickening of the mitral valve leaflet(s). Moderate mitral annular calcification. Trivial mitral valve regurgitation. No evidence of mitral valve stenosis. MV peak gradient, 11.4  mmHg. The mean mitral valve gradient is 4.0 mmHg. Tricuspid Valve: The tricuspid valve is normal in structure. Tricuspid valve  regurgitation is mild . No evidence of tricuspid stenosis. Aortic Valve: The aortic valve has been repaired/replaced. Aortic valve regurgitation is not visualized. No aortic stenosis is present. Aortic valve mean gradient measures 15.5 mmHg. Aortic valve peak gradient measures 26.9 mmHg. Aortic valve area, by VTI measures 2.12 cm. There is a 26 mm Edwards Ultra, stented (TAVR) valve present in the aortic position. Procedure Date: 03/16/22. Pulmonic Valve: The pulmonic valve was normal in structure. Pulmonic valve regurgitation is not visualized. No evidence of pulmonic stenosis. Aorta: The aortic root is normal in size and structure. Venous: The inferior vena cava is dilated in size with less than 50% respiratory variability, suggesting right atrial pressure of 15 mmHg. IAS/Shunts: No atrial level shunt detected by color flow Doppler.  LEFT VENTRICLE PLAX 2D LVIDd:         4.80 cm      Diastology LVIDs:         4.00 cm      LV e' medial:    4.12 cm/s LV PW:         1.00 cm      LV E/e' medial:  37.5 LV IVS:        0.90 cm      LV e' lateral:   4.67 cm/s LVOT diam:     2.00 cm      LV E/e' lateral: 33.1 LV SV:         134 LV SV Index:   65 LVOT Area:     3.14 cm  LV Volumes (MOD) LV vol d, MOD A2C: 126.0 ml LV vol d, MOD A4C: 224.0 ml LV vol s, MOD A2C: 62.6 ml LV vol s, MOD A4C: 115.0 ml LV SV MOD A2C:     63.4 ml LV SV MOD A4C:     224.0 ml LV SV MOD BP:      81.9 ml RIGHT VENTRICLE  IVC RV S prime:     8.38 cm/s  IVC diam: 3.20 cm TAPSE (M-mode): 1.4 cm LEFT ATRIUM              Index        RIGHT ATRIUM           Index LA diam:        5.40 cm  2.62 cm/m   RA Area:     15.90 cm LA Vol (A2C):   81.8 ml  39.62 ml/m  RA Volume:   38.60 ml  18.70 ml/m LA Vol (A4C):   103.0 ml 49.89 ml/m LA Biplane Vol: 91.8 ml  44.46 ml/m  AORTIC VALVE AV Area (Vmax):    2.00 cm AV Area (Vmean):   2.10 cm AV Area (VTI):     2.12 cm AV Vmax:           259.50 cm/s AV Vmean:          185.500 cm/s AV VTI:             0.635 m AV Peak Grad:      26.9 mmHg AV Mean Grad:      15.5 mmHg LVOT Vmax:         165.00 cm/s LVOT Vmean:        124.000 cm/s LVOT VTI:          0.428 m LVOT/AV VTI ratio: 0.67  AORTA Ao Root diam: 2.50 cm Ao Asc diam:  3.50 cm MITRAL VALVE                TRICUSPID VALVE MV Area (PHT): 2.33 cm     TR Peak grad:   18.0 mmHg MV Area VTI:   2.55 cm     TR Mean grad:   14.0 mmHg MV Peak grad:  11.4 mmHg    TR Vmax:        212.00 cm/s MV Mean grad:  4.0 mmHg     TR Vmean:       180.0 cm/s MV Vmax:       1.69 m/s MV Vmean:      89.7 cm/s    SHUNTS MV Decel Time: 326 msec     Systemic VTI:  0.43 m MR Peak grad: 18.5 mmHg     Systemic Diam: 2.00 cm MR Mean grad: 11.0 mmHg MR Vmax:      215.00 cm/s MR Vmean:     151.0 cm/s MV E velocity: 154.50 cm/s Fransico Him MD Electronically signed by Fransico Him MD Signature Date/Time: 03/17/2022/12:51:20 PM    Final    DG CHEST PORT 1 VIEW  Result Date: 03/17/2022 CLINICAL DATA:  Hypoxia EXAM: PORTABLE CHEST 1 VIEW COMPARISON:  CT chest dated 02/24/2022 FINDINGS: Mild subpleural scarring/fibrosis in the lung bases, favoring chronic interstitial lung disease. No focal consolidation. No pleural effusion or pneumothorax. Cardiomegaly.  Postsurgical changes related to prior CABG. Median sternotomy. IMPRESSION: No evidence of acute cardiopulmonary disease. Mild chronic interstitial lung disease. Electronically Signed   By: Julian Hy M.D.   On: 03/17/2022 12:15    Cardiac Studies     TAVR OPERATIVE NOTE     Date of Procedure:                03/16/2022   Preoperative Diagnosis:      Severe Aortic Stenosis    Postoperative Diagnosis:    Same    Procedure:        Transcatheter Aortic Valve  Replacement - Percutaneous  Transfemoral Approach             Edwards Sapien 3 Ultra Resilia THV (size 26 mm, serial # [id number])              Co-Surgeons:                        Coralie Common, MD and [name], MD   Anesthesiologist:                  Suann Larry, MD   Echocardiographer:              Jenkins Rouge, MD   Pre-operative Echo Findings: Severe aortic stenosis Mild left ventricular systolic dysfunction   Post-operative Echo Findings: No paravalvular leak unchanged left ventricular systolic function   Echocardiogram 03/17/22:    1. Left ventricular ejection fraction, by estimation, is 45 to 50%. The  left ventricle has mildly decreased function. The left ventricle  demonstrates global hypokinesis. Left ventricular diastolic function could  not be evaluated. Elevated left  ventricular end-diastolic pressure.   2. Right ventricular systolic function is normal. The right ventricular  size is normal. There is normal pulmonary artery systolic pressure. The  estimated right ventricular systolic pressure is 123456 mmHg.   3. Left atrial size was moderately dilated.   4. The mitral valve is degenerative. Trivial mitral valve regurgitation.  No evidence of mitral stenosis. Moderate mitral annular calcification.   5. The aortic valve has been repaired/replaced. Aortic valve  regurgitation is not visualized. No aortic stenosis is present. There is a  26 mm Edwards Ultra, stented (TAVR) valve present in the aortic position.  Procedure Date: 03/16/22. Aortic valve area,   by VTI measures 2.12 cm. Aortic valve mean gradient measures 15.5 mmHg.  Aortic valve Vmax measures 2.60 m/s.   6. The inferior vena cava is dilated in size with <50% respiratory  variability, suggesting right atrial pressure of 15 mmHg.   7. Compared to immdediate post TAVR study 03/16/22, the mean TAVR gradient  has increased from 8mHg to 152m, Vmax has increased from 2.2 to 2.73m57m and DVI has increased from 0.57 to 0.67.   Patient Profile     JimWOODROE DUET a 71 67o. male with a PAF s/p LAAO ([id number] cirrhosis with splenomegaly complicated by thrombocytopenia and GI bleeding, portal venous hypertension, ILD, CAD s/p CABG, and severe  aortic stenosis who [facility] however post operative hospitalization has been complicated by O2 dependent hypoxia and volume overload.   Assessment & Plan    Severe AS: s/p successful TAVR with a 26 mm Edwards Sapien 3 THV via the TF approach on 03/16/22. Post TAVR echo with LVEF at 45-50% with mild hypokinesis. Mean gradient has increased from 11>>70m89mfrom immediate post TAVR echo to POD 1 with DVI from 0.57>>0.67 Aggressive diuresed yesterday with improvement in volume status. Continue Plavix monotherapy s/p LAAO closure with Watchman.   Paroxysmal atrial fibrillation: s/p LAAO with 27mm35mchman device on 12/17/21. No longer on Eliquis. Continue Plavix monotherapy through 6 months (06/17/22) post implant.     Pulmonary fibrosis with hypoxia: Trouble with hypoxia post TVAR with known ILD. Weaned off NRB>>>15 Reading however now on HFNC. CSW asked to assist with HH oxygen services (has tanks but cannot contact anyone for refills). Goal range 88-90%. Ordered IS. Follows with pulmonary and prescribed Esbriet. CCM asked to see due to worsening  hypoxia.    Acute on Chronic diastolic HF: Aggressively diuresed yesterday with IV Lasix 34m BID. Cr and K+ stable. Net negative 3L today. Weight, 210lb>>closer to admission weight. Continue IV Lasix 842mQD for now. Follow trend.   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    Elevated RAISE score: Kappa/lambda light chain panel abnormal with elevated kappa and lambda chains with elevated ratio at 1.96. Urine immunofixation needs to be collected. Care order placed to RN. Myeloma panel pending.   Signed, Kathyrn DrownNP  03/19/2022, 6:57 AM  Pager [phone] Patient seen, examined. Available data reviewed. Agree with findings, assessment, and plan as outlined by JiKathyrn DrownNP.   The patient is independently interviewed and examined.  He is alert, oriented, in no distress.  He is sitting in the chair at the bedside on high flow oxygen per nasal cannula, reporting significant improvement in his breathing over the last 24 hours.  Lungs with fine rales in the bases, heart regular rate and rhythm with 2/6 systolic murmur at the left lower sternal border and right upper sternal border, abdomen soft and nontender, extremities with improving edema 1+ right lower extremity trace left lower extremity.  Patient improving with IV diuresis.  However, I wonder if he has an exacerbation of his interstitial lung disease.  I appreciate pulmonary consultation and recommendations.  He is eager to be discharged, but understands that his oxygenation needs to improve prior to hospital discharge.  Will continue IV diuresis today and likely switch back to oral diuretic therapy tomorrow.  He is approaching his baseline weight.  The patient underwent watchman implantation 3 months ago and had been on aspirin and clopidogrel.  He is now on antiplatelet monotherapy and his platelet count has dipped just below 50,000.  I think he probably has a lower bleeding risk on low-dose aspirin then he was on clopidogrel and I am going to make that change today.  Follow-up CBC tomorrow.  Otherwise as outlined above.  MiSherren MochaM.D. 03/19/2022 10:32 AM

## 2022-03-19 NOTE — Discharge Instructions (Signed)

## 2022-03-19 NOTE — Consult Note (Signed)
NAME:  [name], MRN:  [id number], DOB:  April 11, 1950, LOS: 3 ADMISSION DATE:  03/16/2022, CONSULTATION DATE: 03/19/2022 REFERRING MD: Dr. Burt Knack, CHIEF COMPLAINT: Hypoxia  History of Present Illness:  [name] is a 72 year old male with a complicated past medical history that is well-documented below who underwent TEVAR procedure 03/16/2022.  Since procedure required high flow oxygen to maintain adequate O2 saturations.  He is followed by Dr. 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  Prior to the procedure he wore nocturnal oxygen but did not utilize his CPAP machine.  Pulmonary critical care has been called to the bedside to help with managing oxygen needs he has been aggressively diuresed which appears to help.  Pertinent  Medical History   Past Medical History:  Diagnosis Date   Adjustment disorder with depressed mood 09/07/2007   Cataract    removed bilaterally   Chronic kidney disease    kidney stones   Cirrhosis ([place])    Colon polyps    Tubular Adenoma 2010   CONGESTIVE HEART FAILURE 12/09/2008   CORONARY ARTERY DISEASE 2006   HYPERLIPIDEMIA 08/09/2006   HYPERTENSION 08/09/2006   MYOCARDIAL INFARCTION, HX OF 07/07/2004   NEPHROLITHIASIS, HX OF 08/09/2006   OBESITY 07/19/2008   Presence of Watchman left atrial appendage closure device 12/17/2021   64m Watchman with Dr. 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 CoBurt Knacknd Dr. WeLavonna Monarch Sleep apnea    has a cpap - not using religiously     Significant Hospital Events: Including procedures, antibiotic start and stop dates in addition to other pertinent events   03/16/2022 TEVAR  Interim History / Subjective:  Status post TEVAR 03/16/2022 requiring high flow oxygen being diuresed  Objective   Blood pressure (!) 138/49, pulse 71, temperature  98.1 F (36.7 C), temperature source Oral, resp. rate (!) 25, height 5' 6"$  (1.676 m), weight 95.4 kg, SpO2 90 %.    FiO2 (%):  [90 %-100 %] 90 %   Intake/Output Summary (Last 24 hours) at 03/19/2022 [id number] data filed at 03/19/2022 0407 Gross per 24 hour  Intake 300 ml  Output 3250 ml  Net -2950 ml   Filed Weights   03/17/22 0500 03/18/22 0401 03/19/22 0316  Weight: 97.8 kg 97.5 kg 95.4 kg    Examination: General: Obese male no acute distress at rest HENT: No JVD or lymphadenopathy is appreciated Lungs: Diminished breath sounds throughout Cardiovascular: Positive for murmur Abdomen: Obese soft Extremities: 3+ pitting edema Neuro: Grossly intact without focal defect  GU: Voids  Resolved Hospital Problem list     Assessment & Plan:  Acute on chronic hypoxic respiratory failure in the setting of 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 Wean FiO2 as tolerated Nocturnal CPAP Agree with aggressive diuresis He will need to follow-up as an outpatient with Dr. MaVaughan Browneruestionable role for steroids  History of coronary artery disease Per cardiology  Thrombocytopenia Platelets are currently 45 Monitor  History of cirrhosis [facility] to [facility]  Best Practice (right click and "Reselect all SmartList Selections" daily)   Diet/type: Regular consistency (see orders) DVT prophylaxis: other GI prophylaxis: PPI Lines: N/A Foley:  N/A Code Status:  full code Last  date of multidisciplinary goals of care discussion [tbd]  Labs   CBC: Recent Labs  Lab 03/12/22 1004 03/16/22 1416 03/16/22 1542 03/16/22 1951 03/17/22 0118 03/18/22 0144 03/19/22 0213  WBC 4.1  --   --  3.8* 5.4 4.7 4.6  HGB 12.4*   < > 11.2* 11.2* 10.4* 10.8* 10.8*  HCT 37.5*   < > 33.0* 34.2* 31.2* 33.6* 32.0*  MCV 95.7  --   --  95.5 94.3 94.9 93.6  PLT 86*   --   --  55* 54* 47* 45*   < > = values in this interval not displayed.    Basic Metabolic Panel: Recent Labs  Lab 03/12/22 1004 03/16/22 1416 03/16/22 1420 03/16/22 1542 03/17/22 0118 03/18/22 0144 03/19/22 0213  NA 138 142 143 142 137 138 140  K 3.4* 4.0 4.0 4.4 4.4 3.8 3.5  CL 106 109  --  111 110 108 108  CO2 22  --   --   --  19* 21* 23  GLUCOSE 135* 126*  --  113* 109* 118* 109*  BUN 14 13  --  14 17 18 14  $ CREATININE 0.82 0.80  --  0.70 1.20 0.95 0.82  CALCIUM 8.4*  --   --   --  8.1* 8.3* 8.5*  MG  --   --   --   --  1.9 2.0  --    GFR: Estimated Creatinine Clearance: 88 mL/min (by C-G formula based on SCr of 0.82 mg/dL). Recent Labs  Lab 03/16/22 1951 03/17/22 0118 03/18/22 0144 03/19/22 0213  WBC 3.8* 5.4 4.7 4.6    Liver Function Tests: Recent Labs  Lab 03/12/22 1004  AST 29  ALT 19  ALKPHOS 63  BILITOT 3.0*  PROT 6.7  ALBUMIN 2.8*   No results for input(s): "LIPASE", "AMYLASE" in the last 168 hours. No results for input(s): "AMMONIA" in the last 168 hours.  ABG    Component Value Date/Time   PHART 7.373 03/16/2022 1420   PCO2ART 36.7 03/16/2022 1420   PO2ART 58 (L) 03/16/2022 1420   HCO3 21.4 03/16/2022 1420   TCO2 19 (L) 03/16/2022 1542   ACIDBASEDEF 3.0 (H) 03/16/2022 1420   O2SAT 89 03/16/2022 1420     Coagulation Profile: Recent Labs  Lab 03/12/22 1004  INR 1.7*    Cardiac Enzymes: No results for input(s): "CKTOTAL", "CKMB", "CKMBINDEX", "TROPONINI" in the last 168 hours.  HbA1C: Hemoglobin A1C  Date/Time Value Ref Range Status  08/10/2021 03:17 PM 5.8 (A) 4.0 - 5.6 % Final   Hgb A1c MFr Bld  Date/Time Value Ref Range Status  02/12/2021 02:38 PM 5.9 4.6 - 6.5 % Final    Comment:    Glycemic Control Guidelines for People with Diabetes:Non Diabetic:  <6%Goal of Therapy: <7%Additional Action Suggested:  >8%   11/30/2019 09:29 AM 5.8 (H) <5.7 % of total Hgb Final    Comment:    For someone without known diabetes, a  hemoglobin  A1c value between 5.7% and 6.4% is consistent with prediabetes and should be confirmed with a  follow-up test. . For someone with known diabetes, a value <7% indicates that their diabetes is well controlled. A1c targets should be individualized based on duration of diabetes, age, comorbid conditions, and other considerations. . This assay result is consistent with an increased risk of diabetes. . Currently, no consensus exists regarding use of hemoglobin A1c for diagnosis of diabetes for children. .     CBG: No results for input(s): "  GLUCAP" in the last 168 hours.  Review of Systems:   10 point review of system taken, please see HPI for positives and negatives. He does not wear CPAP for his diagnosis of OSA Status post TEVAR procedure 03/16/2022  Past Medical History:  He,  has a past medical history of Adjustment disorder with depressed mood (09/07/2007), Cataract, Chronic kidney disease, Cirrhosis ([place]), Colon polyps, CONGESTIVE HEART FAILURE (12/09/2008), CORONARY ARTERY DISEASE (2006), HYPERLIPIDEMIA (08/09/2006), HYPERTENSION (08/09/2006), MYOCARDIAL INFARCTION, HX OF (07/07/2004), NEPHROLITHIASIS, HX OF (08/09/2006), OBESITY (07/19/2008), Presence of Watchman left atrial appendage closure device (12/17/2021), S/P TAVR (transcatheter aortic valve replacement) (03/16/2022), and Sleep apnea.   Surgical History:   Past Surgical History:  Procedure Laterality Date   BIOPSY  11/18/2020   Procedure: BIOPSY;  Surgeon: Yetta Flock, MD;  Location: [facility];  Service: Gastroenterology;;   CARDIOVERSION N/A 12/14/2017   Procedure: CARDIOVERSION;  Surgeon: [name], MD;  Location: [facility] ENDOSCOPY;  Service: Cardiovascular;  Laterality: N/A;   CARDIOVERSION N/A 10/01/2019   Procedure: CARDIOVERSION;  Surgeon: Buford Dresser, MD;  Location: [facility] ENDOSCOPY;  Service: Cardiovascular;  Laterality: N/A;   COLONOSCOPY     COLONOSCOPY WITH PROPOFOL N/A  11/18/2020   Procedure: COLONOSCOPY WITH PROPOFOL;  Surgeon: Yetta Flock, MD;  Location: [facility] ENDOSCOPY;  Service: Gastroenterology;  Laterality: N/A;   CORONARY ARTERY BYPASS GRAFT  2006   ESOPHAGOGASTRODUODENOSCOPY (EGD) WITH PROPOFOL N/A 11/18/2020   Procedure: ESOPHAGOGASTRODUODENOSCOPY (EGD) WITH PROPOFOL;  Surgeon: Yetta Flock, MD;  Location: [facility] ENDOSCOPY;  Service: Gastroenterology;  Laterality: N/A;   INTRAOPERATIVE TRANSTHORACIC ECHOCARDIOGRAM N/A 03/16/2022   Procedure: INTRAOPERATIVE TRANSTHORACIC ECHOCARDIOGRAM;  Surgeon: [name], MD;  Location: [place] CV LAB;  Service: Open Heart Surgery;  Laterality: N/A;   LEFT ATRIAL APPENDAGE OCCLUSION N/A 12/17/2021   Procedure: LEFT ATRIAL APPENDAGE OCCLUSION;  Surgeon: [name], MD;  Location: [place] CV LAB;  Service: Cardiovascular;  Laterality: N/A;   POLYPECTOMY     POLYPECTOMY  11/18/2020   Procedure: POLYPECTOMY;  Surgeon: Yetta Flock, MD;  Location: [facility] ENDOSCOPY;  Service: Gastroenterology;;   RIGHT/LEFT HEART CATH AND CORONARY/GRAFT ANGIOGRAPHY N/A 02/17/2022   Procedure: RIGHT/LEFT HEART CATH AND CORONARY/GRAFT ANGIOGRAPHY;  Surgeon: [name], MD;  Location: [place] CV LAB;  Service: Cardiovascular;  Laterality: N/A;   TEE WITHOUT CARDIOVERSION N/A 12/17/2021   Procedure: TRANSESOPHAGEAL ECHOCARDIOGRAM (TEE);  Surgeon: [name], MD;  Location: [place] CV LAB;  Service: Cardiovascular;  Laterality: N/A;   TRANSCATHETER AORTIC VALVE REPLACEMENT, TRANSFEMORAL Right 03/16/2022   Procedure: Transcatheter Aortic Valve Replacement, Transfemoral;  Surgeon: [name], MD;  Location: [place] CV LAB;  Service: Open Heart Surgery;  Laterality: Right;     Social History:   reports that he has never smoked. He has never used smokeless tobacco. He reports current alcohol use of about 1.0 standard drink of alcohol per week. He reports that he does not use drugs.   Family  History:  His family history includes Bipolar disorder in his brother; Heart disease in his father; Hyperlipidemia in his mother; Hypertension in his mother; Ulcers in his father. There is no history of Colon cancer, Colon polyps, Esophageal cancer, Rectal cancer, or Stomach cancer.   Allergies No Known Allergies   Home Medications  Prior to Admission medications   Medication Sig Start Date End Date Taking? Authorizing Provider  acetaminophen (TYLENOL) 500 MG tablet Take 1,000 mg by mouth daily as needed for moderate pain or headache.   Yes [provider]  atorvastatin (LIPITOR) 80 MG tablet TAKE 1 TABLET BY MOUTH EVERY DAY 10/06/21  Yes Isaac Bliss, Rayford Halsted, MD  carvedilol (COREG) 3.125 MG tablet TAKE 1 TABLET BY MOUTH TWICE A DAY WITH FOOD 10/28/21  Yes Armbruster, Carlota Raspberry, MD  clopidogrel (PLAVIX) 75 MG tablet Take 1 tablet (75 mg total) by mouth daily. START 02-01-2022 01/20/22  Yes Kathyrn Drown D, NP  [employer] VITAMIN B12 1000 MCG tablet TAKE 1 TABLET BY MOUTH EVERY DAY 01/23/22  Yes Orson [place], MD  furosemide (LASIX) 40 MG tablet Take 2 tablets (80 mg total) by mouth 2 (two) times daily. 07/30/21  Yes Lelon Perla, MD  KLOR-CON M20 20 MEQ tablet TAKE 1 TABLET BY MOUTH EVERY DAY 12/29/21  Yes Crenshaw, Denice Bors, MD  Misc Natural Products (LIVER PROTECT PO) Take 1 capsule by mouth 2 (two) times daily. From [employer]   Yes [provider]  Pirfenidone (ESBRIET) 267 MG TABS Take 3 tablets (801 mg total) by mouth 3 (three) times daily with meals. Month 2 and onwards (maintenance) 12/28/21  Yes Mannam, Praveen, MD  tamsulosin (FLOMAX) 0.4 MG CAPS capsule TAKE ONE CAPSULE BY MOUTH EVERY DAY AFTER SUPPER 10/06/21  Yes Isaac Bliss, Rayford Halsted, MD  empagliflozin (JARDIANCE) 10 MG TABS tablet Take 1 tablet (10 mg total) by mouth daily before breakfast. 06/22/21   Stanford Breed Denice Bors, MD     Critical care time: Ferol Luz Syrina Wake ACNP Acute Care Nurse  Practitioner [place] Please consult Amion 03/19/2022, 8:52 AM

## 2022-03-19 NOTE — TOC Initial Note (Addendum)
Transition of Care (TOC) - Initial/Assessment Note  Marvetta Gibbons RN, BSN Transitions of Care Unit 4E- RN Case Manager See Treatment Team for direct phone #   Patient Details  Name: [name] MRN: [id number] Date of Birth: 1950/12/16  Transition of Care [facility]) CM/SW Contact:    Dawayne Patricia, RN Phone Number: 03/19/2022, 4:04 PM  Clinical Narrative:                 CM spoke with pt at bedside along with wife.  Patient familiar to CM from hospital stay last year in Nov.- pt was set up with home 02 at that time w/ [employer]. Per pt and wife they have been trying to get in touch with [employer] for new tubing supplies but each time they call the number provided to them when 02 was delivered- the office tells them they do not have pt in the system- CM will follow up with [employer] liaison to see what is going on. Pt also still has CPAP however self reports that he was not wearing consistently. Pt states he plans to do better being consistent when he returns home.  Pt and wife asking again about [employer] for home "like they see on TV"- explained insurance does not usually cover these and they would need to call [employer] themselves to ask about pricing and see if the machine would fit pt's needs.   Discussed other transition needs- pt voiced that he would like to do outpt PT at [facility]- would need referral sent to the [facility] location for outpt PT.   CM will continue to follow- home 02 needs- if pt not able to get back down to baseline 2L- will need new qualifying note as well as order for new liter flow home 02- so that we can send in to [employer] to make sure he has the correct home equipment to meet his needs.    Call made to [facility]- per checking in system- pt is serviced by [employer] out of [place] for home 02 needs- Jermaine to get correct phone # for this branch for CM to provide to pt/wife.  Jermaine can assist with any further changes in home 02 needs should pt  need higher liter flow at discharge.   Expected Discharge Plan: OP Rehab Barriers to Discharge: Continued Medical Work up   Patient Goals and CMS Choice Patient states their goals for this hospitalization and ongoing recovery are:: return home and get better/stronger   Choice offered to / list presented to : NA      Expected Discharge Plan and Services   Discharge Planning Services: CM Consult Post Acute Care Choice: NA Living arrangements for the past 2 months: Single Family Home                   DME Agency: [place]: NA [place] Agency: NA        Prior Living Arrangements/Services Living arrangements for the past 2 months: Single Family Home Lives with:: Spouse Patient language and need for interpreter reviewed:: Yes Do you feel safe going back to the place where you live?: Yes      Need for Family Participation in Patient Care: Yes (Comment) Care giver support system in place?: Yes (comment) Current home services: DME (CPAP and home 02) Criminal Activity/Legal Involvement Pertinent to Current Situation/Hospitalization: No - Comment as needed  Activities of Daily Living Home Assistive Devices/Equipment: Eyeglasses  ADL Screening (condition at time of admission) Patient's cognitive ability adequate to safely complete daily activities?: Yes Is the patient deaf or have difficulty hearing?: No Does the patient have difficulty seeing, even when wearing glasses/contacts?: No Does the patient have difficulty concentrating, remembering, or making decisions?: No Patient able to express need for assistance with ADLs?: Yes Does the patient have difficulty dressing or bathing?: No Independently performs ADLs?: Yes (appropriate for developmental age) Does the patient have difficulty walking or climbing stairs?: No Weakness of Legs: None Weakness of Arms/Hands: None  Permission Sought/Granted   Permission granted to share information with : Yes,  Verbal Permission Granted     Permission granted to share info w AGENCY: DME        Emotional Assessment Appearance:: Appears stated age Attitude/Demeanor/Rapport: Engaged Affect (typically observed): Accepting, Appropriate, Pleasant Orientation: : Oriented to Self, Oriented to Place, Oriented to  Time, Oriented to Situation Alcohol / Substance Use: Not Applicable Psych Involvement: No (comment)  Admission diagnosis:  S/P TAVR (transcatheter aortic valve replacement) [Z95.2] Patient Active Problem List   Diagnosis Date Noted   Chronic respiratory failure with hypoxia ([place]) 03/19/2022   ILD (interstitial lung disease) ([place]) 03/19/2022   Acute on chronic HFrEF (heart failure with reduced ejection fraction) ([place]) 03/17/2022   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) 12/17/2021   Presence of Watchman left atrial appendage closure device 12/17/2021   CAP (community acquired pneumonia) 02/16/2021   Benign neoplasm of colon    Hepatic cirrhosis ([place])    Gastritis and gastroduodenitis    Esophageal varices without bleeding ([place])    Atrial flutter ([place])    Atypical atrial flutter ([place])    Severe aortic stenosis 10/11/2017   Murmur 06/26/2015   History of colon polyps 09/20/2013   Impaired glucose tolerance 09/20/2013   Exogenous obesity 09/20/2013   Thrombocytopenia, unspecified ([place]) 09/20/2013   Bruit 08/28/2013   Chronic combined systolic and diastolic heart failure ([place]) 12/09/2008   DYSPNEA 12/09/2008   OBESITY 07/19/2008   WEIGHT GAIN 07/15/2008   ADJUSTMENT DISORDER WITH DEPRESSED MOOD 09/07/2007   CELLULITIS, RIGHT LEG 07/04/2007   Dyslipidemia 08/09/2006   Essential hypertension 08/09/2006   MYOCARDIAL INFARCTION, HX OF 08/09/2006   Coronary atherosclerosis 08/09/2006   NEPHROLITHIASIS, HX OF 08/09/2006   PCP:  Isaac Bliss, Rayford Halsted,  MD Pharmacy:   [id number] [place] [place] - [address] [place] [address]Phone: [phone]: [address]Phone: [phone]: [phone] [facility] [place]Phone: [phone]: [phone]    Social Determinants of Health (SDOH) Social History: [place] No Food Insecurity (12/17/2021)  Housing: Low Risk  (12/17/2021)  Transportation Needs: No Transportation Needs (12/17/2021)  Utilities: Not At Risk (12/17/2021)  Depression (PHQ2-9): Low Risk  (02/12/2021)  Tobacco Use: Low Risk  (03/17/2022)   SDOH Interventions:     Readmission Risk Interventions    12/18/2021   11:14 AM  Readmission Risk Prevention Plan  Post Dischage Appt Complete  Medication Screening Complete  Transportation Screening Complete

## 2022-03-20 DIAGNOSIS — I350 Nonrheumatic aortic (valve) stenosis: Secondary | ICD-10-CM | POA: Diagnosis not present

## 2022-03-20 LAB — BASIC METABOLIC PANEL
Anion gap: 8 (ref 5–15)
BUN: 15 mg/dL (ref 8–23)
CO2: 24 mmol/L (ref 22–32)
Calcium: 8.5 mg/dL — ABNORMAL LOW (ref 8.9–10.3)
Chloride: 103 mmol/L (ref 98–111)
Creatinine, Ser: 0.82 mg/dL (ref 0.61–1.24)
GFR, Estimated: >60 mL/min (ref >60)
Glucose, Bld: 159 mg/dL — ABNORMAL HIGH (ref 70–99)
Potassium: 3.9 mmol/L (ref 3.5–5.1)
Sodium: 135 mmol/L (ref 135–145)

## 2022-03-20 LAB — CBC
HCT: 34.8 % — ABNORMAL LOW (ref 39.0–52.0)
Hemoglobin: 11.2 g/dL — ABNORMAL LOW (ref 13.0–17.0)
MCH: 30.4 pg (ref 26.0–34.0)
MCHC: 32.2 g/dL (ref 30.0–36.0)
MCV: 94.6 fL (ref 80.0–100.0)
Platelets: 40 10*3/uL — ABNORMAL LOW (ref 150–400)
RBC: 3.68 MIL/uL — ABNORMAL LOW (ref 4.22–5.81)
RDW: 16.7 % — ABNORMAL HIGH (ref 11.5–15.5)
WBC: 2.9 10*3/uL — ABNORMAL LOW (ref 4.0–10.5)
nRBC: 0 % (ref 0.0–0.2)

## 2022-03-20 MED ORDER — FUROSEMIDE 40 MG PO TABS
80.0000 mg | ORAL_TABLET | Freq: Two times a day (BID) | ORAL | Status: DC
  Administered 2022-03-20 – 2022-03-21 (×4): 80 mg via ORAL
  Filled 2022-03-20 (×4): qty 2

## 2022-03-20 MED ORDER — POTASSIUM CHLORIDE CRYS ER 20 MEQ PO TBCR
40.0000 meq | EXTENDED_RELEASE_TABLET | Freq: Once | ORAL | Status: AC
  Administered 2022-03-20: 40 meq via ORAL
  Filled 2022-03-20: qty 2

## 2022-03-20 NOTE — Consult Note (Signed)
NAME:  [name], MRN:  [id number], DOB:  14-Apr-1950, LOS: 4 ADMISSION DATE:  03/16/2022, CONSULTATION DATE: 03/19/2022 REFERRING MD: Dr. Burt Knack, CHIEF COMPLAINT: Hypoxia  History of Present Illness:   [name] is a 72 year old male with a complicated past medical history that is well-documented below who underwent TEVAR procedure 03/16/2022.  Since procedure required high flow oxygen to maintain adequate O2 saturations.  He is followed by Dr. 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  Prior to the procedure he wore nocturnal oxygen but did not utilize his CPAP machine.  Pulmonary critical care has been called to the bedside to help with managing oxygen needs he has been aggressively diuresed which appears to help.  Pertinent  Medical History   Past Medical History:  Diagnosis Date   Adjustment disorder with depressed mood 09/07/2007   Cataract    removed bilaterally   Chronic kidney disease    kidney stones   Cirrhosis ([place])    Colon polyps    Tubular Adenoma 2010   CONGESTIVE HEART FAILURE 12/09/2008   CORONARY ARTERY DISEASE 2006   HYPERLIPIDEMIA 08/09/2006   HYPERTENSION 08/09/2006   MYOCARDIAL INFARCTION, HX OF 07/07/2004   NEPHROLITHIASIS, HX OF 08/09/2006   OBESITY 07/19/2008   Presence of Watchman left atrial appendage closure device 12/17/2021   41m Watchman with Dr. 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 CoBurt Knacknd Dr. WeLavonna Monarch Sleep apnea    has a cpap - not using religiously    Significant Hospital Events: Including procedures, antibiotic start and stop dates in addition to other pertinent events   03/16/2022 TAVR  Interim History / Subjective:   Looks and feels better.  Oxygen requirements improved today  Objective   Blood pressure (!) 123/49, pulse 72, temperature 98.4 F  (36.9 C), temperature source Oral, resp. rate 18, height 5' 6"$  (1.676 m), weight 93.8 kg, SpO2 97 %.    FiO2 (%):  [50 %] 50 %   Intake/Output Summary (Last 24 hours) at 03/20/2022 1759 Last data filed at 03/20/2022 0900 Gross per 24 hour  Intake 600 ml  Output 500 ml  Net 100 ml   Filed Weights   03/18/22 0401 03/19/22 0316 03/20/22 0500  Weight: 97.5 kg 95.4 kg 93.8 kg    Examination: Gen:      No acute distress HEENT:  EOMI, sclera anicteric Neck:     No masses; no thyromegaly Lungs:    Clear to auscultation bilaterally; normal respiratory effort CV:         Regular rate and rhythm; no murmurs Abd:      + bowel sounds; soft, non-tender; no palpable masses, no distension Ext:    No edema; adequate peripheral perfusion Skin:      Warm and dry; no rash Neuro: alert and oriented x 3 Psych: normal mood and affect   Resolved Hospital Problem list     Assessment & Plan:  Acute on chronic hypoxic, hypercarbic respiratory failure Baseline pulmonary fibrosis, IPF Improving oxygen requirements.  Continue diuresis as tolerated Awaiting HRCT for evaluation of pulmonary fibrosis and evaluate for ILD flareup Continue Solu-Medrol 40 mg twice daily  History of coronary artery disease Per cardiology  Thrombocytopenia Platelets are currently 45 Monitor  History of cirrhosis secondary to [facility]  Best Practice (right click and "Reselect all  SmartList Selections" daily)   Per primary team  Signature:    Marshell Garfinkel MD [facility] See Amion for pager  If no response to pager , please call [phone] until 7pm After 7:00 pm call Elink  [phone] 03/20/2022, 5:59 PM

## 2022-03-20 NOTE — Progress Notes (Signed)
Rounding Note    Patient Name: [name] Date of Encounter: 03/20/2022  [place] Cardiologist: Kirk Ruths, MD   Subjective   No chest pain, dyspnea improving.  Inpatient Medications    Scheduled Meds:  aspirin EC  81 mg Oral Daily   atorvastatin  80 mg Oral Daily   carvedilol  3.125 mg Oral BID WC   melatonin  3 mg Oral QHS   methylPREDNISolone (SOLU-MEDROL) injection  40 mg Intravenous Q12H   mouth rinse  15 mL Mouth Rinse 4 times per day   sodium chloride flush  3 mL Intravenous Q12H   tamsulosin  0.4 mg Oral QPC supper   Continuous Infusions:  sodium chloride     sodium chloride     nitroGLYCERIN     PRN Meds: sodium chloride, acetaminophen **OR** acetaminophen, bisacodyl, morphine injection, ondansetron (ZOFRAN) IV, mouth rinse, oxyCODONE, sodium chloride, sodium chloride flush, traMADol   Vital Signs    Vitals:   03/19/22 2042 03/19/22 2307 03/20/22 0142 03/20/22 0330  BP:  (!) 103/56  (!) 104/45  Pulse: 64 62 (!) 56 60  Resp: (!) 21 20 19 14  $ Temp:  98.3 F (36.8 C)  97.8 F (36.6 C)  TempSrc:  Oral  Oral  SpO2: 91% 93% 92% 91%  Weight:      Height:        Intake/Output Summary (Last 24 hours) at 03/20/2022 0638 Last data filed at 03/20/2022 0353 Gross per 24 hour  Intake 120 ml  Output 500 ml  Net -380 ml      03/19/2022    3:16 AM 03/18/2022    4:01 AM 03/17/2022    5:00 AM  Last 3 Weights  Weight (lbs) 210 lb 5.1 oz 214 lb 15.2 oz 215 lb 9.8 oz  Weight (kg) 95.4 kg 97.5 kg 97.8 kg      Telemetry    Sinus with nonconducted PACs- Personally Reviewed   Physical Exam   GEN: No acute distress.   Neck: No JVD Cardiac: RRR, 1/6 systolic murmur Respiratory: Dry crackles halfway up bilaterally GI: Soft, nontender, non-distended  MS: Trace edema Neuro:  Nonfocal  Psych: Normal affect   Labs     Chemistry Recent Labs  Lab 03/17/22 0118 03/18/22 0144 03/19/22 0213 03/20/22 0113  NA 137 138 140 135  K 4.4  3.8 3.5 3.9  CL 110 108 108 103  CO2 19* 21* 23 24  GLUCOSE 109* 118* 109* 159*  BUN 17 18 14 15  $ CREATININE 1.20 0.95 0.82 0.82  CALCIUM 8.1* 8.3* 8.5* 8.5*  MG 1.9 2.0  --   --   GFRNONAA >60 >60 >60 >60  ANIONGAP 8 9 9 8     $ Hematology Recent Labs  Lab 03/18/22 0144 03/19/22 0213 03/20/22 0113  WBC 4.7 4.6 2.9*  RBC 3.54* 3.42* 3.68*  HGB 10.8* 10.8* 11.2*  HCT 33.6* 32.0* 34.8*  MCV 94.9 93.6 94.6  MCH 30.5 31.6 30.4  MCHC 32.1 33.8 32.2  RDW 17.4* 17.2* 16.7*  PLT 47* 45* 40*     Patient Profile     [name] is a 72 y.o. 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  Post TAVR echocardiogram showed ejection fraction 45  to 50%, moderate left atrial enlargement, status post aortic valve replacement with mean gradient 15.5 mmHg and no aortic insufficiency.  Assessment & Plan   1 status post TAVR-follow-up echocardiogram after procedure showed mild LV dysfunction and normally functioning prosthetic aortic valve.  2 acute on chronic diastolic congestive heart failure-[id number]/[id number] since admission.  Volume status appears to be reasonable.  Will transition to Lasix 80 mg by mouth twice daily.  Resume Jardiance.  3 pulmonary fibrosis-question exacerbation contributing to hypoxemia.  Pulmonary has evaluated and initiated Solu-Medrol.  High-resolution CT scan pending.  Wean oxygen as tolerated.  4 paroxysmal atrial fibrillation-patient remains in sinus rhythm.  He is status post watchman and is no longer on apixaban.  Continue aspirin (patient has thrombocytopenia and Plavix was discontinued in favor of aspirin to decrease risk of bleeding).  5 history of nonalcoholic cirrhosis with  splenomegaly, portal venous hypertension and thrombocytopenia.  6 thrombocytopenia-will continue to follow platelet count.  7 coronary artery disease-continue aspirin and statin.  For questions or updates, please contact [place] Please consult [email or web address] for contact info under        Signed, Kirk Ruths, MD  03/20/2022, 6:38 AM

## 2022-03-20 NOTE — Progress Notes (Signed)
CARDIAC REHAB PHASE I   PRE:  Rate/Rhythm: 68 NSR  BP:  Sitting: 120/45      SaO2: 93 4L  MODE:  Ambulation: 140 ft   AD:  RW  POST:  Rate/Rhythm: 89 NSR  BP:  Sitting: 130/53      SaO2: 84-92 RA  Pt amb with handhed assistance, pt denies dizziness, light headiness, and reports min-moderate SOB throughout amb.  During amb pt was on 4L initially but had to be turned up 8L d/t Sats dropping to 84. On 8L pt sats rose to 90 and we returned to room w/o c/p. Pt back on 4L in room and Sats are low 90.   Christen Bame  12:03 PM 03/20/2022    Service time is from 1140 to 1210.

## 2022-03-20 NOTE — Progress Notes (Signed)
Came to ambulate pt however, pt request to ambulate later d/t family and friends. Pt still on HF.   [name] 03/20/2022 10:41 AM

## 2022-03-21 ENCOUNTER — Other Ambulatory Visit: Payer: Self-pay | Admitting: Physician Assistant

## 2022-03-21 DIAGNOSIS — I350 Nonrheumatic aortic (valve) stenosis: Secondary | ICD-10-CM | POA: Diagnosis not present

## 2022-03-21 DIAGNOSIS — I5022 Chronic systolic (congestive) heart failure: Secondary | ICD-10-CM

## 2022-03-21 LAB — CBC
HCT: 36.2 % — ABNORMAL LOW (ref 39.0–52.0)
Hemoglobin: 12.3 g/dL — ABNORMAL LOW (ref 13.0–17.0)
MCH: 31.4 pg (ref 26.0–34.0)
MCHC: 34 g/dL (ref 30.0–36.0)
MCV: 92.3 fL (ref 80.0–100.0)
Platelets: 41 10*3/uL — ABNORMAL LOW (ref 150–400)
RBC: 3.92 MIL/uL — ABNORMAL LOW (ref 4.22–5.81)
RDW: 16.4 % — ABNORMAL HIGH (ref 11.5–15.5)
WBC: 5.4 10*3/uL (ref 4.0–10.5)
nRBC: 0 % (ref 0.0–0.2)

## 2022-03-21 LAB — BASIC METABOLIC PANEL
Anion gap: 4 — ABNORMAL LOW (ref 5–15)
BUN: 22 mg/dL (ref 8–23)
CO2: 25 mmol/L (ref 22–32)
Calcium: 8.8 mg/dL — ABNORMAL LOW (ref 8.9–10.3)
Chloride: 105 mmol/L (ref 98–111)
Creatinine, Ser: 0.82 mg/dL (ref 0.61–1.24)
GFR, Estimated: >60 mL/min (ref >60)
Glucose, Bld: 166 mg/dL — ABNORMAL HIGH (ref 70–99)
Potassium: 4.4 mmol/L (ref 3.5–5.1)
Sodium: 134 mmol/L — ABNORMAL LOW (ref 135–145)

## 2022-03-21 MED ORDER — PREDNISONE 20 MG PO TABS
40.0000 mg | ORAL_TABLET | Freq: Every day | ORAL | Status: DC
  Administered 2022-03-21: 40 mg via ORAL
  Filled 2022-03-21: qty 2

## 2022-03-21 MED ORDER — EMPAGLIFLOZIN 10 MG PO TABS
10.0000 mg | ORAL_TABLET | Freq: Every day | ORAL | Status: DC
  Administered 2022-03-21: 10 mg via ORAL
  Filled 2022-03-21: qty 1

## 2022-03-21 MED ORDER — ASPIRIN 81 MG PO TBEC: 81.0000 mg | DELAYED_RELEASE_TABLET | Freq: Every day | ORAL | 12 refills | Status: DC

## 2022-03-21 MED ORDER — PREDNISONE 20 MG PO TABS: ORAL_TABLET | ORAL | 0 refills | Status: AC

## 2022-03-21 NOTE — Progress Notes (Signed)
Post hospital f/u BMET

## 2022-03-21 NOTE — Progress Notes (Signed)
While awake H.R. runs S.R. 1 degree heart block in 60's. When asleep H.R. at times drop to 49 S.B. 1 degree heart for a few heart beats then backup in the 50's. [name] R.N. aware.

## 2022-03-21 NOTE — Progress Notes (Signed)
D/c tele and Ivs. Went over AVS with pt and all questions were addressed.  Lavenia Atlas, RN

## 2022-03-21 NOTE — Progress Notes (Signed)
Mobility Specialist Progress Note:   03/21/22 1009  Mobility  Activity Ambulated with assistance in hallway  Level of Assistance Standby assist, set-up cues, supervision of patient - no hands on  Assistive Device Front wheel walker  Distance Ambulated (ft) 250 ft  Activity Response Tolerated well  Mobility Referral Yes  $Mobility charge 1 Mobility   Pre-mobility: 95% SpO2 on 4L Post-mobility: 93% SpO2 on 4L  Pt received in chair and eager. C/o mild SOB at end of session. Pt returned to chair with all needs met, call bell in reach, and family in room. Pt requested second walk today, will f/u as schedule allows.   Andrey Campanile Mobility Specialist Please contact via SecureChat or  Rehab office at [phone]

## 2022-03-21 NOTE — TOC Transition Note (Signed)
Transition of Care [facility]) - CM/SW Discharge Note   Patient Details  Name: [name] MRN: [id number] Date of Birth: 1950/05/07  [facility]) CM/SW Contact:  Carles Collet, RN Phone Number: 03/21/2022, 3:48 PM   Clinical Narrative:     New amb sats and DME oxygen order obtained. Notified Jermaine w [employer]. No other TOC needs identified for DC    Barriers to Discharge: Continued Medical Work up   Patient Goals and CMS Choice   Choice offered to / list presented to : NA  Discharge Placement                         Discharge Plan and Services Additional resources added to the After Visit Summary for     Discharge Planning Services: CM Consult Post Acute Care Choice: NA            DME Agency: [place]: NA [place] Agency: NA        Social Determinants of Health ([place]) Interventions [place]: No Food Insecurity (12/17/2021)  Housing: Low Risk  (12/17/2021)  Transportation Needs: No Transportation Needs (12/17/2021)  Utilities: Not At Risk (12/17/2021)  Depression (PHQ2-9): Low Risk  (02/12/2021)  Tobacco Use: Low Risk  (03/17/2022)     Readmission Risk Interventions    12/18/2021   11:14 AM  Readmission Risk Prevention Plan  Post Dischage Appt Complete  Medication Screening Complete  Transportation Screening Complete

## 2022-03-21 NOTE — Progress Notes (Signed)
SATURATION QUALIFICATIONS: (This note is used to comply with regulatory documentation for home oxygen)  Patient Saturations on Room Air at Rest = 90%  Patient Saturations on Room Air while Ambulating = 84%  Patient Saturations on 4 Liters of oxygen while Ambulating = 88%  Please briefly explain why patient needs home oxygen: Pt needs at least 4L 0xygen while ambulating in order to keep his 02 sat =88 or above  Lavenia Atlas, Therapist, sports

## 2022-03-21 NOTE — Progress Notes (Signed)
Rounding Note    Patient Name: [name] Date of Encounter: 03/21/2022  [place] Cardiologist: Kirk Ruths, MD   Subjective   No CP; no dyspnea on O2  Inpatient Medications    Scheduled Meds:  aspirin EC  81 mg Oral Daily   atorvastatin  80 mg Oral Daily   carvedilol  3.125 mg Oral BID WC   furosemide  80 mg Oral BID   melatonin  3 mg Oral QHS   methylPREDNISolone (SOLU-MEDROL) injection  40 mg Intravenous Q12H   mouth rinse  15 mL Mouth Rinse 4 times per day   sodium chloride flush  3 mL Intravenous Q12H   tamsulosin  0.4 mg Oral QPC supper   Continuous Infusions:  sodium chloride     sodium chloride     PRN Meds: sodium chloride, acetaminophen **OR** acetaminophen, bisacodyl, morphine injection, ondansetron (ZOFRAN) IV, mouth rinse, oxyCODONE, sodium chloride, sodium chloride flush, traMADol   Vital Signs    Vitals:   03/21/22 0444 03/21/22 0638 03/21/22 0834 03/21/22 0844  BP: (!) 106/47  (!) 120/48   Pulse: (!) 58  61 (!) 57  Resp: 19  15   Temp: 97.9 F (36.6 C)  97.8 F (36.6 C)   TempSrc: Oral  Oral   SpO2: 94%  94%   Weight:  94.7 kg    Height:        Intake/Output Summary (Last 24 hours) at 03/21/2022 0927 Last data filed at 03/21/2022 0800 Gross per 24 hour  Intake 480 ml  Output 700 ml  Net -220 ml       03/21/2022    6:38 AM 03/20/2022    5:00 AM 03/19/2022    3:16 AM  Last 3 Weights  Weight (lbs) 208 lb 12.4 oz 206 lb 12.7 oz 210 lb 5.1 oz  Weight (kg) 94.7 kg 93.8 kg 95.4 kg      Telemetry    Sinus with nonconducted PACs- Personally Reviewed   Physical Exam   GEN: NAD Neck: supple Cardiac: RRR, 1/6 systolic murmur; no DM Respiratory: Dry crackles halfway up bilaterally; no wheeze GI: Soft, NT/ND MS: 1+ edema Neuro:  Grossly intact Psych: Normal affect   Labs     Chemistry Recent Labs  Lab 03/17/22 0118 03/18/22 0144 03/19/22 0213 03/20/22 0113 03/21/22 0109  NA 137 138 140 135 134*  K 4.4  3.8 3.5 3.9 4.4  CL 110 108 108 103 105  CO2 19* 21* 23 24 25  $ GLUCOSE 109* 118* 109* 159* 166*  BUN 17 18 14 15 22  $ CREATININE 1.20 0.95 0.82 0.82 0.82  CALCIUM 8.1* 8.3* 8.5* 8.5* 8.8*  MG 1.9 2.0  --   --   --   GFRNONAA >60 >60 >60 >60 >60  ANIONGAP 8 9 9 8 $ 4*      Hematology Recent Labs  Lab 03/19/22 0213 03/20/22 0113 03/21/22 0109  WBC 4.6 2.9* 5.4  RBC 3.42* 3.68* 3.92*  HGB 10.8* 11.2* 12.3*  HCT 32.0* 34.8* 36.2*  MCV 93.6 94.6 92.3  MCH 31.6 30.4 31.4  MCHC 33.8 32.2 34.0  RDW 17.2* 16.7* 16.4*  PLT 45* 40* 41*      Patient Profile     [name] is a 72 y.o. male with a PAF s/p LAAO ([id number]), nonalcoholic cirrhosis with splenomegaly complicated by thrombocytopenia and GI bleeding, portal venous hypertension, ILD, CAD s/p CABG, and severe aortic stenosis who presented to [facility] for [facility]AVR; post  operative hospitalization has been complicated by O2 dependent hypoxia and volume overload.  Post TAVR echocardiogram showed ejection fraction 45 to 50%, moderate left atrial enlargement, status post aortic valve replacement with mean gradient 15.5 mmHg and no aortic insufficiency.  Assessment & Plan   1 status post TAVR-follow-up echocardiogram after procedure showed mild LV dysfunction and normally functioning prosthetic aortic valve.  2 acute on chronic diastolic congestive heart failure- Volume status appears to be reasonable.  Continue present dose of Lasix and resume Jardiance.  3 pulmonary fibrosis-question exacerbation contributing to hypoxemia.  Pulmonary has evaluated and initiated Solu-Medrol.  High-resolution CT scan pending.   4 paroxysmal atrial fibrillation-patient remains in sinus rhythm.  He is status post watchman and is no longer on apixaban.  Continue aspirin (patient has thrombocytopenia and Plavix was discontinued in favor of aspirin to decrease risk of bleeding).  5 history of nonalcoholic cirrhosis with splenomegaly, portal venous  hypertension and thrombocytopenia.  6 thrombocytopenia-will continue to follow platelet count.  7 coronary artery disease-continue aspirin and statin.  For questions or updates, please contact [place] Please consult [email or web address] for contact info under        Signed, Kirk Ruths, MD  03/21/2022, 9:27 AM

## 2022-03-21 NOTE — Progress Notes (Addendum)
NAME:  [name], MRN:  [id number], DOB:  04-05-1950, LOS: 5 ADMISSION DATE:  03/16/2022, CONSULTATION DATE: 03/19/2022 REFERRING MD: Dr. Burt Knack, CHIEF COMPLAINT: Hypoxia  History of Present Illness:   [name] is a 72 year old male with a complicated past medical history that is well-documented below who underwent TEVAR procedure 03/16/2022.  Since procedure required high flow oxygen to maintain adequate O2 saturations.  He is followed by Dr. 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  Prior to the procedure he wore nocturnal oxygen but did not utilize his CPAP machine.  Pulmonary critical care has been called to the bedside to help with managing oxygen needs he has been aggressively diuresed which appears to help.  Pertinent  Medical History   Past Medical History:  Diagnosis Date   Adjustment disorder with depressed mood 09/07/2007   Cataract    removed bilaterally   Chronic kidney disease    kidney stones   Cirrhosis ([place])    Colon polyps    Tubular Adenoma 2010   CONGESTIVE HEART FAILURE 12/09/2008   CORONARY ARTERY DISEASE 2006   HYPERLIPIDEMIA 08/09/2006   HYPERTENSION 08/09/2006   MYOCARDIAL INFARCTION, HX OF 07/07/2004   NEPHROLITHIASIS, HX OF 08/09/2006   OBESITY 07/19/2008   Presence of Watchman left atrial appendage closure device 12/17/2021   46m Watchman with Dr. CBurt Knack  S/P TAVR (transcatheter aortic valve replacement) 03/16/2022   23mS3UR via TF approach with Dr. CoBurt Knacknd Dr. WeLavonna Monarch Sleep apnea    has a cpap - not using religiously    Significant Hospital Events: Including procedures, antibiotic start and stop dates in addition to other pertinent events   03/16/2022 TAVR  Interim History / Subjective:   Continues to feel better.  O2 requirements down to 3 L He is working with PT and has walked up and down the hallway several times today  already  Objective   Blood pressure (!) 123/49, pulse (!) 58, temperature 97.7 F (36.5 C), temperature source Oral, resp. rate 19, height 5' 6"$  (1.676 m), weight 94.7 kg, SpO2 93 %.        Intake/Output Summary (Last 24 hours) at 03/21/2022 1244 Last data filed at 03/21/2022 0800 Gross per 24 hour  Intake 480 ml  Output 700 ml  Net -220 ml   Filed Weights   03/19/22 0316 03/20/22 0500 03/21/22 0638  Weight: 95.4 kg 93.8 kg 94.7 kg    Examination: Blood pressure (!) 123/49, pulse (!) 58, temperature 97.7 F (36.5 C), temperature source Oral, resp. rate 19, height 5' 6"$  (1.676 m), weight 94.7 kg, SpO2 93 %. Gen:      No acute distress HEENT:  EOMI, sclera anicteric Neck:     No masses; no thyromegaly Lungs:    Clear to auscultation bilaterally; normal respiratory effort CV:         Regular rate and rhythm; no murmurs Abd:      + bowel sounds; soft, non-tender; no palpable masses, no distension Ext:    No edema; adequate peripheral perfusion Skin:      Warm and dry; no rash Neuro: alert and oriented x 3 Psych: normal mood and affect   Resolved Hospital Problem list     Assessment & Plan:  Acute on chronic hypoxic, hypercarbic respiratory failure Baseline pulmonary fibrosis, IPF Improving oxygen requirements.  Continue diuresis as tolerated HRCT has not been done yet but  given his improvement and respiratory status we can defer and get this as an outpatient where the study will be more useful.  I will cancel the CT order Change steroid to prednisone to 40 mg a day and taper by 10 mg every week I will make follow-up visit in clinic in the next 2 to 4 weeks to reassess and order follow-up imaging. He will need supplemental oxygen on discharge.  History of coronary artery disease Per cardiology  Thrombocytopenia Platelets are currently 45 Monitor  History of cirrhosis secondary to [facility] LFTs  Best Practice (right click and "Reselect all SmartList Selections"  daily)   Per primary team  Signature:    Marshell Garfinkel MD [place] [facility] See Amion for pager  If no response to pager , please call [phone] until 7pm After 7:00 pm call Elink  [phone] 03/21/2022, 12:44 PM

## 2022-03-22 ENCOUNTER — Telehealth: Payer: Self-pay

## 2022-03-22 ENCOUNTER — Other Ambulatory Visit: Payer: Self-pay | Admitting: *Deleted

## 2022-03-22 DIAGNOSIS — I5022 Chronic systolic (congestive) heart failure: Secondary | ICD-10-CM

## 2022-03-22 LAB — PROTEIN ELECTRO, RANDOM URINE
Albumin ELP, Urine: 100 %
Alpha-1-Globulin, U: 0 %
Alpha-2-Globulin, U: 0 %
Beta Globulin, U: 0 %
Gamma Globulin, U: 0 %
Total Protein, Urine: 7.2 mg/dL

## 2022-03-22 LAB — MULTIPLE MYELOMA PANEL, SERUM
Albumin SerPl Elph-Mcnc: 2.7 g/dL — ABNORMAL LOW (ref 2.9–4.4)
Albumin/Glob SerPl: 1 (ref 0.7–1.7)
Alpha 1: 0.2 g/dL (ref 0.0–0.4)
Alpha2 Glob SerPl Elph-Mcnc: 0.3 g/dL — ABNORMAL LOW (ref 0.4–1.0)
B-Globulin SerPl Elph-Mcnc: 0.8 g/dL (ref 0.7–1.3)
Gamma Glob SerPl Elph-Mcnc: 1.6 g/dL (ref 0.4–1.8)
Globulin, Total: 2.9 g/dL (ref 2.2–3.9)
IgA: 503 mg/dL — ABNORMAL HIGH (ref 61–437)
IgG (Immunoglobin G), Serum: 1493 mg/dL (ref 603–1613)
IgM (Immunoglobulin M), Srm: 90 mg/dL (ref 15–143)
Total Protein ELP: 5.6 g/dL — ABNORMAL LOW (ref 6.0–8.5)

## 2022-03-22 NOTE — Addendum Note (Signed)
Addended by: Gaetano Net on: 03/22/2022 09:37 AM   Modules accepted: Orders

## 2022-03-22 NOTE — Addendum Note (Signed)
Addended by: Gaetano Net on: 03/22/2022 08:08 AM   Modules accepted: Orders

## 2022-03-22 NOTE — Transitions of Care (Post Inpatient/ED Visit) (Signed)
   03/22/2022  Name: [name] MRN: [id number] DOB: Nov 26, 1950  Today's TOC FU Call Status: Today's TOC FU Call Status:: Successful TOC FU Call Competed TOC FU Call Complete Date: 03/22/22  Transition Care Management Follow-up Telephone Call Date of Discharge: 03/21/22 [facility]) Type of Discharge: Inpatient Admission Primary Inpatient Discharge Diagnosis:: Severe aortic stenosis How have you been since you were released from the hospital?: Better Any questions or concerns?: No  Items Reviewed: Did you receive and understand the discharge instructions provided?: Yes Medications obtained and verified?: Yes (Medications Reviewed) Any new allergies since your discharge?: No Dietary orders reviewed?: NA Do you have support at home?: Yes  Home Care and Equipment/Supplies: [place] Ordered?: NA Any new equipment or medical supplies ordered?: Yes Name of Medical supply agency?: oxygen Were you able to get the equipment/medical supplies?: Yes Do you have any questions related to the use of the equipment/supplies?: No  Functional Questionnaire: Do you need assistance with bathing/showering or dressing?: No Do you need assistance with meal preparation?: No Do you need assistance with eating?: No Do you have difficulty maintaining continence: No Do you need assistance with getting out of bed/getting out of a chair/moving?: No Do you have difficulty managing or taking your medications?: No  Folllow up appointments reviewed: PCP Follow-up appointment confirmed?: NA Specialist Hospital Follow-up appointment confirmed?: Yes Date of Specialist follow-up appointment?: 03/29/22 Follow-Up Specialty Provider:: Kathyrn Drown NP Do you need transportation to your follow-up appointment?: No Do you understand care options if your condition(s) worsen?: Yes-patient verbalized understanding    Mobile LPN [place] Direct Dial [phone]

## 2022-03-22 NOTE — Telephone Encounter (Signed)
Patient contacted regarding discharge from [facility] on 03/21/2022.  Patient understands to follow up with provider Kathyrn Drown NP on 03/29/2022 at 8:45 AM at [facility] location. Patient understands discharge instructions? yes Patient understands medications and regiment? yes Patient understands to bring all medications to this visit? Yes  The pt is doing well at this time and does not have any questions or concerns at this time.

## 2022-03-23 ENCOUNTER — Other Ambulatory Visit: Payer: Self-pay | Admitting: Cardiology

## 2022-03-23 DIAGNOSIS — E854 Organ-limited amyloidosis: Secondary | ICD-10-CM

## 2022-03-23 LAB — IMMUNOFIXATION, URINE

## 2022-03-24 ENCOUNTER — Ambulatory Visit: Payer: 59 | Admitting: Cardiology

## 2022-03-26 ENCOUNTER — Ambulatory Visit: Payer: 59

## 2022-03-26 ENCOUNTER — Telehealth: Payer: Self-pay | Admitting: Hematology and Oncology

## 2022-03-26 ENCOUNTER — Ambulatory Visit: Payer: 59 | Admitting: Cardiovascular Disease

## 2022-03-26 NOTE — Telephone Encounter (Signed)
Scheduled appt per 2/20 referral. Pt already established with Dr. Lorenso Courier. Called pt, no answer and vm full. Mailed letter with appt information.

## 2022-03-26 NOTE — Progress Notes (Signed)
Review of labs - pt with CKD 2

## 2022-03-26 NOTE — Progress Notes (Unsigned)
[facility] [place]                                     Cardiology Office Note:    Date:  03/29/2022   ID:  JALEEL COBAUGH, DOB February 21, 1950, MRN [id number]  PCP:  Isaac Bliss, Rayford Halsted, MD  [facility] Cardiologist:  Kirk Ruths, MD  / Dr. Burt Knack, MD and Dr. Lavonna Monarch, MD (TAVR)  [facility] Electrophysiologist:  None   Referring MD: Isaac Bliss, [place]*   Chief Complaint  Patient presents with   Follow-up    TOC s/p TAVR   History of Present Illness:    [name] is a 72 y.o. 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 for planned TAVR 03/16/22 and is being seen today for TOC follow up.    Mr. Qin has a long hx of cirrhosis complicated by thrombocytopenia and is followed closely by GI. Additionally he is followed by pulmonology due to interstitial lung disease. He has undergone high-resolution CT scanning and PFTs with Dr. Vaughan Browner. He has been followed for quite some time by Dr. Stanford Breed for his cardiology care. He has been monitored with surveillance echocardiogram imaging for progressive aortic stenosis. The patient is known to the structural heart team after undergoing Watchman implant with Dr. Burt Knack 12/2021. He has done well however wished to see Dr. Burt Knack once again for further evaluation of his aortic stenosis.    At the time of consult, he had c/o progressive dyspnea and fatigue. He was not felt to be a surgical candidate due to high risk co-morbitities as above. Post LAAO CT was converted to pre TAVR scans which showed anatomy suitable to proceed. R/LHC showed multivessel coronary artery disease, continued patency of the LIMA to LAD and saphenous vein graft to first diagonal with chronic occlusion of the saphenous vein graft OM  and saphenous vein graft to PDA.    The patient was then evaluated by the multidisciplinary valve team and felt to have severe, symptomatic aortic stenosis and to be a suitable candidate and is now s/p successful TAVR with a 26 mm Edwards Sapien 3 THV via the TF approach on 03/16/22. Post TAVR echo sowed an LVEF at 45-50% with mild hypokinesis. Mean gradient increased from 11>>54mHg from immediate post TAVR echo to POD 1 with DVI from 0.57>>0.67. He was continued on Plavix monotherapy s/p LAAO closure with Watchman. His Plt count dropped below 50K and his risk of bleeding was felt to be lower on ASA '81mg'$  QD therefore Plavix was discontinued.   Unfortunately his hospitalization with complicated by acute CHF and acute hypoxic respiratory failure. He was treated with IV lasix with improvement in his fluid status. He was placed on HF [place] and was seen by his pulmonologist while inpatient and placed on a steroid taper. He has follow up on 3/20 with Dr. MVaughan Browner   Today he is here alone and reports that he has been feeling very well since discharge. He is wearing his oxygen only while in his home. He denies SOB, chest pain, palpitations, orthopnea, dizziness, or syncope. He continues to have chronic LE edema, but this has also improved. He reports taking his Lasix more on a regular basis since discharge  and wearing his CPAP. He is tolerating medications without issues. Denies bleeding in stool ir urine.    Past Medical History:  Diagnosis Date   Adjustment disorder with depressed mood 09/07/2007   Cataract    removed bilaterally   Chronic kidney disease    kidney stones   Cirrhosis ([place])    Colon polyps    Tubular Adenoma 2010   CONGESTIVE HEART FAILURE 12/09/2008   CORONARY ARTERY DISEASE 2006   HYPERLIPIDEMIA 08/09/2006   HYPERTENSION 08/09/2006   MYOCARDIAL INFARCTION, HX OF 07/07/2004   NEPHROLITHIASIS, HX OF 08/09/2006   OBESITY 07/19/2008   Presence of Watchman left atrial appendage closure  device 12/17/2021   95m Watchman with Dr. 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 CoBurt Knacknd Dr. WeLavonna Monarch Sleep apnea    has a cpap - not using religiously    Past Surgical History:  Procedure Laterality Date   BIOPSY  11/18/2020   Procedure: BIOPSY;  Surgeon: ArYetta FlockMD;  [facility];  Service: Gastroenterology;;   CARDIOVERSION N/A 12/14/2017   Procedure: CARDIOVERSION;  Surgeon: RaSkeet LatchMD;  [facility] Service: Cardiovascular;  Laterality: N/A;   CARDIOVERSION N/A 10/01/2019   Procedure: CARDIOVERSION;  Surgeon: ChBuford DresserMD;  [facility];  Service: Cardiovascular;  Laterality: N/A;   COLONOSCOPY     COLONOSCOPY WITH PROPOFOL N/A 11/18/2020   Procedure: COLONOSCOPY WITH PROPOFOL;  Surgeon: ArYetta FlockMD;  [facility];  Service: Gastroenterology;  Laterality: N/A;   CORONARY ARTERY BYPASS GRAFT  2006   ESOPHAGOGASTRODUODENOSCOPY (EGD) WITH PROPOFOL N/A 11/18/2020   Procedure: ESOPHAGOGASTRODUODENOSCOPY (EGD) WITH PROPOFOL;  Surgeon: ArYetta FlockMD;  [facility];  Service: Gastroenterology;  Laterality: N/A;   INTRAOPERATIVE TRANSTHORACIC ECHOCARDIOGRAM N/A 03/16/2022   Procedure: INTRAOPERATIVE TRANSTHORACIC ECHOCARDIOGRAM;  Surgeon: CoSherren MochaMD;  [facility];  Service: Open Heart Surgery;  Laterality: N/A;   LEFT ATRIAL APPENDAGE OCCLUSION N/A 12/17/2021   Procedure: LEFT ATRIAL APPENDAGE OCCLUSION;  Surgeon: CoSherren MochaMD;  [facility];  Service: Cardiovascular;  Laterality: N/A;   POLYPECTOMY     POLYPECTOMY  11/18/2020   Procedure: POLYPECTOMY;  Surgeon: ArYetta FlockMD;  [facility];  Service: Gastroenterology;;   RIGHT/LEFT HEART CATH AND CORONARY/GRAFT ANGIOGRAPHY N/A 02/17/2022   Procedure: RIGHT/LEFT HEART CATH AND  CORONARY/GRAFT ANGIOGRAPHY;  Surgeon: CoSherren MochaMD;  [facility];  Service: Cardiovascular;  Laterality: N/A;   TEE WITHOUT CARDIOVERSION N/A 12/17/2021   Procedure: TRANSESOPHAGEAL ECHOCARDIOGRAM (TEE);  Surgeon: CoSherren MochaMD;  [facility];  Service: Cardiovascular;  Laterality: N/A;   TRANSCATHETER AORTIC VALVE REPLACEMENT, TRANSFEMORAL Right 03/16/2022   Procedure: Transcatheter Aortic Valve Replacement, Transfemoral;  Surgeon: CoSherren MochaMD;  [facility];  Service: Open Heart Surgery;  Laterality: Right;    Current Medications: Current Meds  Medication Sig   acetaminophen (TYLENOL) 500 MG tablet Take 1,000 mg by mouth daily as needed for moderate pain or headache.   amoxicillin (AMOXIL) 500 MG tablet Take 4 tablets (2,000 mg total) by mouth as directed. 1 HOUR PRIOR TO DENTAL APPOINTMENTS   aspirin EC 81 MG tablet Take 1 tablet (81 mg total) by mouth daily. Swallow whole.   atorvastatin (LIPITOR) 80 MG tablet TAKE 1 TABLET BY MOUTH EVERY DAY   carvedilol (COREG) 3.125 MG tablet TAKE 1 TABLET  BY MOUTH TWICE A DAY WITH FOOD   [employer] VITAMIN B12 1000 MCG tablet TAKE 1 TABLET BY MOUTH EVERY DAY   empagliflozin (JARDIANCE) 10 MG TABS tablet Take 1 tablet (10 mg total) by mouth daily before breakfast.   furosemide (LASIX) 40 MG tablet Take 2 tablets (80 mg total) by mouth 2 (two) times daily.   KLOR-CON M20 20 MEQ tablet TAKE 1 TABLET BY MOUTH EVERY DAY   Misc Natural Products (LIVER PROTECT PO) Take 1 capsule by mouth 2 (two) times daily. From [employer]   predniSONE (DELTASONE) 20 MG tablet Take 2 tablets (40 mg total) by mouth daily with breakfast for 7 days, THEN 1.5 tablets (30 mg total) daily with breakfast for 7 days, THEN 1 tablet (20 mg total) daily with breakfast for 7 days, THEN 0.5 tablets (10 mg total) daily with breakfast.   tamsulosin (FLOMAX) 0.4 MG CAPS capsule TAKE ONE CAPSULE BY MOUTH EVERY DAY AFTER SUPPER      Allergies:   Patient has no known allergies.   Social History   Socioeconomic History   Marital status: Significant Other    Spouse name: Not on file   Number of children: 0   Years of education: Not on file   Highest education level: Not on file  Occupational History   Occupation: Passenger transport manager  Tobacco Use   Smoking status: Never   Smokeless tobacco: Never  Vaping Use   Vaping Use: Never used  Substance and Sexual Activity   Alcohol use: Yes    Alcohol/week: 1.0 standard drink of alcohol    Types: 1 Cans of beer per week    Comment: occasional   Drug use: No   Sexual activity: Not on file  Other Topics Concern   Not on file  Social History Narrative   Not on file   Social Determinants of Health   Financial Resource Strain: Not on file  Food Insecurity: No Food Insecurity (12/17/2021)   Hunger Vital Sign    Worried About Running Out of Food in the Last Year: Never true    Ran Out of Food in the Last Year: Never true  Transportation Needs: No Transportation Needs (12/17/2021)   PRAPARE - Hydrologist (Medical): No    Lack of Transportation (Non-Medical): No  Physical Activity: Not on file  Stress: Not on file  Social Connections: Not on file    Family History: The patient's family history includes Bipolar disorder in his brother; Heart disease in his father; Hyperlipidemia in his mother; Hypertension in his mother; Ulcers in his father. There is no history of Colon cancer, Colon polyps, Esophageal cancer, Rectal cancer, or Stomach cancer.  ROS:   Please see the history of present illness.    All other systems reviewed and are negative.  EKGs/Labs/Other Studies Reviewed:    The following studies were reviewed today:   TAVR OPERATIVE NOTE     Date of Procedure:                03/16/2022   Preoperative Diagnosis:      Severe Aortic Stenosis    Postoperative Diagnosis:    Same    Procedure:         Transcatheter Aortic Valve Replacement - Percutaneous  Transfemoral Approach             Edwards Sapien 3 Ultra Resilia THV (size 26 mm, serial # [id number])  Co-Surgeons:                        Coralie Common, MD and [name], MD   Anesthesiologist:                  Suann Larry, MD   Echocardiographer:              Jenkins Rouge, MD   Pre-operative Echo Findings: Severe aortic stenosis Mild left ventricular systolic dysfunction   Post-operative Echo Findings: No paravalvular leak unchanged left ventricular systolic function  _____________   Echocardiogram 03/17/22:  1. Left ventricular ejection fraction, by estimation, is 45 to 50%. The  left ventricle has mildly decreased function. The left ventricle  demonstrates global hypokinesis. Left ventricular diastolic function could  not be evaluated. Elevated left  ventricular end-diastolic pressure.   2. Right ventricular systolic function is normal. The right ventricular  size is normal. There is normal pulmonary artery systolic pressure. The  estimated right ventricular systolic pressure is 123456 mmHg.   3. Left atrial size was moderately dilated.   4. The mitral valve is degenerative. Trivial mitral valve regurgitation.  No evidence of mitral stenosis. Moderate mitral annular calcification.   5. The aortic valve has been repaired/replaced. Aortic valve  regurgitation is not visualized. No aortic stenosis is present. There is a  26 mm Edwards Ultra, stented (TAVR) valve present in the aortic position.  Procedure Date: 03/16/22. Aortic valve area,   by VTI measures 2.12 cm. Aortic valve mean gradient measures 15.5 mmHg.  Aortic valve Vmax measures 2.60 m/s.   6. The inferior vena cava is dilated in size with <50% respiratory  variability, suggesting right atrial pressure of 15 mmHg.   7. Compared to immdediate post TAVR study 03/16/22, the mean TAVR gradient  has increased from 2mHg to 197m, Vmax has increased  from 2.2 to 2.29m45m and DVI has increased from 0.57 to 0.67.    Limited Echocardiogram 03/16/22 IMPRESSIONS  1. Septal apical and inferior wall hypokinesis . Left ventricular  ejection fraction, by estimation, is 45 to 50%. The left ventricle has  mildly decreased function. The left ventricle demonstrates regional wall  motion abnormalities (see scoring  diagram/findings for description). The left ventricular internal cavity  size was mildly dilated.   2. The mitral valve is abnormal. Trivial mitral valve regurgitation.  Moderate mitral annular calcification.   3. Pre TAVR tri leaflet AV with severe AS mean gradient 27 peak 46 mmHg  AVA 1.6 cm2 supine in cath lab       Post TAVR: well place 26 mm Sapien 3 valve with no significant PVL  mean gradient 11 peak 20 mmHg AVI 3.1 cm2. The aortic valve has been  repaired/replaced. There is a 26 mm Sapien prosthetic (TAVR) valve present  in the aortic position. Procedure Date:   03/16/2022.    Recent Labs: 05/20/2021: B Natriuretic Peptide 1,190.0 03/12/2022: ALT 19 03/18/2022: Magnesium 2.0 03/21/2022: BUN 22; Creatinine, Ser 0.82; Hemoglobin 12.3; Platelets 41; Potassium 4.4; Sodium 134   Recent Lipid Panel    Component Value Date/Time   CHOL 118 02/12/2021 1438   CHOL 111 07/23/2019 1144   TRIG 66.0 02/12/2021 1438   HDL 40.30 02/12/2021 1438   HDL 48 07/23/2019 1144   CHOLHDL 3 02/12/2021 1438   VLDL 13.2 02/12/2021 1438   LDLCALC 65 02/12/2021 1438   LDLCALC 53 11/30/2019 0929   LDLDIRECT 198.2 07/11/2009 0925   Physical Exam:  VS:  BP (!) 120/50   Pulse 63   Ht '5\' 6"'$  (1.676 m)   Wt 210 lb 6.4 oz (95.4 kg)   SpO2 92%   BMI 33.96 kg/m     Wt Readings from Last 3 Encounters:  03/29/22 210 lb 6.4 oz (95.4 kg)  03/21/22 208 lb 12.4 oz (94.7 kg)  03/01/22 212 lb (96.2 kg)    General: Well developed, well nourished, NAD Neck: Negative for carotid bruits. No JVD Lungs:Clear to ausculation bilaterally. No wheezes, rales, or  rhonchi. Breathing is unlabored. Cardiovascular: RRR with S1 S2. No murmurs Extremities: 2+ BLE R>L, stable. Groin site stable. Neuro: Alert and oriented. No focal deficits. No facial asymmetry. MAE spontaneously. Psych: Responds to questions appropriately with normal affect.    ASSESSMENT/PLAN:    Severe AS: s/p successful TAVR with a 26 mm Edwards Sapien 3 THV via the TF approach on 03/16/22. Post TAVR echo with LVEF at 45-50% with mild hypokinesis. Mean gradient increased from 11>>24mHg from immediate post TAVR echo to POD 1 with DVI from 0.57>>0.67. He was initially continued on Plavix monotherapy s/p LAAO closure however, given thrombocytopenia, this was transitioned to ASA '81mg'$  QD. Groin sites are stable with no hematoma or bleeding. Dental SBE discussed and will be RX'ed amoxicillin today. Plan one month follow up with echo. This is already scheduled.   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 Aggressively diuresed with IV Lasix post op with improvement in volume status and transitioned back to oral regimen prior to discharge. He reported inconsistencies with his medications prior to admission. He states that he has been taking Lasix since discharge. Continues to have chronic LE edema R>L but improved. Weight has been stable and breathing is good.   Paroxysmal atrial fibrillation: s/p LAAO with 260mWatchman device on 12/17/21. He is no longer on Eliquis. He was to continue Plavix monotherapy through 6 months (06/17/22) post implant however due to worsening thrombocytopenia, Plavix was stopped and he was transitioned to ASA '81mg'$ . Continue ASA.     Pulmonary fibrosis with hypoxia: Trouble with post-op hypoxia treated with HF [place]. He was evaluated by pulmonology and placed on steroid taper. He is now wearing O2 while at home only. High resolution CT pending  and will be followed by Dr. MaVaughan BrownerKeep follow up with pulmonary.    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    Thrombocytopenia: s/p Watchman and has been treated with Plavix monotherapy. Due to worsening thrombocytopenia, his Plavix was stopped and transitioned to ASA '81mg'$ . Plt count remained stable at discharge.   Elevated RAISE score: Kappa/lambda light chain panel abnormal with elevated kappa and lambda chains with elevated ratio at 1.96. Urine immunofixation NEGATIVE. Myeloma panel with evidence of polyclonal increase detected in one or more immunoglobulin. 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 He has been referred to hematology with appointment on 3/20. Will order PYP scan today. Patient given cardiac amyloid pamphlet and discussed in depth.     Medication Adjustments/Labs and Tests Ordered: Current medicines are reviewed at length with the patient today.  Concerns regarding medicines are outlined above.  Orders Placed This Encounter  Procedures   Basic metabolic panel   CBC  Meds ordered this encounter  Medications   amoxicillin (AMOXIL) 500 MG tablet    Sig: Take 4 tablets (2,000 mg total) by mouth as directed. 1 HOUR PRIOR TO DENTAL APPOINTMENTS    Dispense:  12 tablet    Refill:  6    Patient Instructions  Medication Instructions:  Your physician has recommended you make the following change in your medication:  START AMOXICILLIN 500 MG - TAKE 2000 MG (4 TABLETS) 1 HOUR PRIOR TO DENTAL CLEANINGS AND PROCEDURES   *If you need a refill on your cardiac medications before your next appointment, please call your pharmacy*   Lab Work: TODAY: BMET If you have labs (blood work) drawn today and your tests are completely normal, you will receive your  results only by: [place] (if you have MyChart) OR A paper copy in the mail If you have any lab test that is abnormal or we need to change your treatment, we will call you to review the results.   Testing/Procedures: NONE   Follow-Up: At [facility], you and your health needs are our priority.  As part of our continuing mission to provide you with exceptional heart care, we have created designated Provider Care Teams.  These Care Teams include your primary Cardiologist (physician) and Advanced Practice Providers (APPs -  Physician Assistants and Nurse Practitioners) who all work together to provide you with the care you need, when you need it.  We recommend signing up for the patient portal called "MyChart".  Sign up information is provided on this After Visit Summary.  MyChart is used to connect with patients for Virtual Visits (Telemedicine).  Patients are able to view lab/test results, encounter notes, upcoming appointments, etc.  Non-urgent messages can be sent to your provider as well.   To learn more about what you can do with MyChart, go to [email or web address].    Your next appointment:   KEEP SCHEDULED FOLLOW-UP   Signed, Kathyrn Drown, NP  03/29/2022 12:30 PM    [facility]

## 2022-03-29 ENCOUNTER — Telehealth: Payer: Self-pay | Admitting: Cardiology

## 2022-03-29 ENCOUNTER — Ambulatory Visit: Payer: 59 | Attending: Cardiology | Admitting: Cardiology

## 2022-03-29 ENCOUNTER — Ambulatory Visit: Payer: 59

## 2022-03-29 VITALS — BP 120/50 | HR 63 | Ht 66.0 in | Wt 210.4 lb

## 2022-03-29 DIAGNOSIS — Z952 Presence of prosthetic heart valve: Secondary | ICD-10-CM

## 2022-03-29 DIAGNOSIS — E854 Organ-limited amyloidosis: Secondary | ICD-10-CM

## 2022-03-29 DIAGNOSIS — I5022 Chronic systolic (congestive) heart failure: Secondary | ICD-10-CM

## 2022-03-29 DIAGNOSIS — I350 Nonrheumatic aortic (valve) stenosis: Secondary | ICD-10-CM

## 2022-03-29 DIAGNOSIS — J849 Interstitial pulmonary disease, unspecified: Secondary | ICD-10-CM

## 2022-03-29 DIAGNOSIS — Z95818 Presence of other cardiac implants and grafts: Secondary | ICD-10-CM

## 2022-03-29 DIAGNOSIS — I10 Essential (primary) hypertension: Secondary | ICD-10-CM | POA: Diagnosis not present

## 2022-03-29 DIAGNOSIS — I43 Cardiomyopathy in diseases classified elsewhere: Secondary | ICD-10-CM | POA: Insufficient documentation

## 2022-03-29 DIAGNOSIS — D696 Thrombocytopenia, unspecified: Secondary | ICD-10-CM

## 2022-03-29 DIAGNOSIS — I5042 Chronic combined systolic (congestive) and diastolic (congestive) heart failure: Secondary | ICD-10-CM

## 2022-03-29 MED ORDER — AMOXICILLIN 500 MG PO TABS: 2000.0000 mg | ORAL_TABLET | ORAL | 6 refills | Status: DC

## 2022-03-29 NOTE — Telephone Encounter (Signed)
Pt is calling stating that he needs a release to work letter sent to him via MyChart and mailed to his house. He states he forgot to ask for one before he left his appt.

## 2022-03-29 NOTE — Patient Instructions (Signed)
Medication Instructions:  Your physician has recommended you make the following change in your medication:  START AMOXICILLIN 500 MG - TAKE 2000 MG (4 TABLETS) 1 HOUR PRIOR TO DENTAL CLEANINGS AND PROCEDURES   *If you need a refill on your cardiac medications before your next appointment, please call your pharmacy*   Lab Work: TODAY: BMET If you have labs (blood work) drawn today and your tests are completely normal, you will receive your results only by: [place] (if you have MyChart) OR A paper copy in the mail If you have any lab test that is abnormal or we need to change your treatment, we will call you to review the results.   Testing/Procedures: NONE   Follow-Up: At [facility], you and your health needs are our priority.  As part of our continuing mission to provide you with exceptional heart care, we have created designated Provider Care Teams.  These Care Teams include your primary Cardiologist (physician) and Advanced Practice Providers (APPs -  Physician Assistants and Nurse Practitioners) who all work together to provide you with the care you need, when you need it.  We recommend signing up for the patient portal called "MyChart".  Sign up information is provided on this After Visit Summary.  MyChart is used to connect with patients for Virtual Visits (Telemedicine).  Patients are able to view lab/test results, encounter notes, upcoming appointments, etc.  Non-urgent messages can be sent to your provider as well.   To learn more about what you can do with MyChart, go to [email or web address].    Your next appointment:   KEEP SCHEDULED FOLLOW-UP

## 2022-03-30 LAB — BASIC METABOLIC PANEL
BUN/Creatinine Ratio: 21 (ref 10–24)
BUN: 22 mg/dL (ref 8–27)
CO2: 25 mmol/L (ref 20–29)
Calcium: 8.7 mg/dL (ref 8.6–10.2)
Chloride: 103 mmol/L (ref 96–106)
Creatinine, Ser: 1.06 mg/dL (ref 0.76–1.27)
Glucose: 127 mg/dL — ABNORMAL HIGH (ref 70–99)
Potassium: 4.1 mmol/L (ref 3.5–5.2)
Sodium: 143 mmol/L (ref 134–144)
eGFR: 75 mL/min/{1.73_m2} (ref >59)

## 2022-03-30 NOTE — Telephone Encounter (Signed)
Returned patient called and made him aware that have sent his work release letter to his Mychart and his home address listed on file. Patient verbalized understanding and thanked me for the call.

## 2022-04-01 NOTE — Progress Notes (Signed)
Office Visit Note  Patient: [name]             Date of Birth: 12/08/1950           MRN: [id number]             PCP: Isaac Bliss, Rayford Halsted, MD Referring: Marshell Garfinkel, MD Visit Date: 04/15/2022 Occupation: '@GUAROCC'$ @  Subjective:  ILD and positive rheumatoid factor  History of Present Illness: [name] is a 72 y.o. 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  He was referred to me for the evaluation of positive rheumatoid factor.   Patient recalls seeing Dr. Lorenso Courier for thrombocytopenia couple of years ago.  I reviewed the note from Dr. Lorenso Courier in September 2022.  At that time it was determined that thrombocytopenia was secondary to cirrhosis of liver.  He has been under care of Dr. Havery Moros for cirrhosis of liver.  He was evaluated by Dr. Vaughan Browner in August 2023 due to shortness of breath.  A cardiac CT showed interstitial changes.  Resolution CT showed UIP pattern.  Has been also under care of Dr. Stanford Breed for atrial fibrillation.  He had Watchman procedure.  He cannot take anticoagulants due to chronic thrombocytopenia.  He also recently had aortic valve replacement.  He was placed on Solu-Medrol while he was in the hospital on March 20, 2018 for which she has been gradually tapering.  He was taken off the pirfenidone while he was in the hospital.  Follow-up appointment coming up with Dr. Vaughan Browner. Patient denies history of morning stiffness, joint pain or joint swelling.  There is no family history of autoimmune disease.    Activities of Daily Living:  Patient reports morning stiffness for 0  none .   Patient Denies nocturnal pain.  Difficulty dressing/grooming: Denies Difficulty climbing stairs: Denies Difficulty getting out of chair: Denies Difficulty using hands for taps, buttons, cutlery, and/or writing:  Denies  Review of Systems  Constitutional:  Negative for fatigue.  HENT:  Negative for mouth sores and mouth dryness.   Eyes:  Negative for dryness.  Respiratory:  Positive for shortness of breath.   Cardiovascular:  Negative for chest pain and palpitations.  Gastrointestinal:  Positive for constipation. Negative for blood in stool and diarrhea.  Endocrine: Negative for increased urination.  Genitourinary:  Negative for involuntary urination.  Musculoskeletal:  Positive for gait problem. Negative for joint pain, joint pain, joint swelling, myalgias, muscle weakness, morning stiffness, muscle tenderness and myalgias.  Skin:  Negative for color change, rash, hair loss and sensitivity to sunlight.  Allergic/Immunologic: Negative for susceptible to infections.  Neurological:  Positive for dizziness. Negative for headaches.  Hematological:  Negative for swollen glands.  Psychiatric/Behavioral:  Negative for depressed mood and sleep disturbance. The patient is not nervous/anxious.     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) ([place]) 03/17/2022   S/P TAVR (transcatheter aortic valve replacement) 03/16/2022   Hypoxia 12/18/2021   Pulmonary fibrosis ([place]) 12/18/2021   PAF (paroxysmal atrial fibrillation) ([place]) 12/17/2021   Presence of Watchman left atrial appendage closure device 12/17/2021   CAP (community acquired pneumonia) 02/16/2021   Benign neoplasm of colon    Hepatic cirrhosis (  [place])    Gastritis and gastroduodenitis    Esophageal varices without bleeding ([place])    Atrial flutter (HCC)    Atypical atrial flutter (HCC)    Severe aortic stenosis 10/11/2017   Murmur 06/26/2015   History of colon polyps 09/20/2013   Impaired glucose tolerance 09/20/2013   Exogenous obesity  09/20/2013   Thrombocytopenia, unspecified ([place]) 09/20/2013   Bruit 08/28/2013   Chronic combined systolic and diastolic heart failure ([place]) 12/09/2008   DYSPNEA 12/09/2008   OBESITY 07/19/2008   WEIGHT GAIN 07/15/2008   ADJUSTMENT DISORDER WITH DEPRESSED MOOD 09/07/2007   CELLULITIS, RIGHT LEG 07/04/2007   Dyslipidemia 08/09/2006   Essential hypertension 08/09/2006   MYOCARDIAL INFARCTION, HX OF 08/09/2006   Coronary atherosclerosis 08/09/2006   NEPHROLITHIASIS, HX OF 08/09/2006    Past Medical History:  Diagnosis Date   Adjustment disorder with depressed mood 09/07/2007   Cataract    removed bilaterally   Chronic kidney disease    kidney stones   Cirrhosis ([place])    Colon polyps    Tubular Adenoma 2010   CONGESTIVE HEART FAILURE 12/09/2008   CORONARY ARTERY DISEASE 2006   HYPERLIPIDEMIA 08/09/2006   HYPERTENSION 08/09/2006   MYOCARDIAL INFARCTION, HX OF 07/07/2004   NEPHROLITHIASIS, HX OF 08/09/2006   OBESITY 07/19/2008   Presence of Watchman left atrial appendage closure device 12/17/2021   44m Watchman with Dr. 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 CoBurt Knacknd Dr. WeLavonna Monarch Sleep apnea    has a cpap - not using religiously    Family History  Problem Relation Age of Onset   Hypertension Mother    Hyperlipidemia Mother    Heart disease Father    Ulcers Father    Bipolar disorder Brother    Colon cancer Neg Hx    Colon polyps Neg Hx    Esophageal cancer Neg Hx    Rectal cancer Neg Hx    Stomach cancer Neg Hx    Past Surgical History:  Procedure Laterality Date   BIOPSY  11/18/2020   Procedure: BIOPSY;  Surgeon: ArYetta FlockMD;  [facility];  Service: Gastroenterology;;   CARDIOVERSION N/A 12/14/2017   Procedure: CARDIOVERSION;  Surgeon: RaSkeet LatchMD;  [facility];  Service: Cardiovascular;  Laterality: N/A;   CARDIOVERSION N/A 10/01/2019   Procedure:  CARDIOVERSION;  Surgeon: ChBuford DresserMD;  [facility] Service: Cardiovascular;  Laterality: N/A;   COLONOSCOPY     COLONOSCOPY WITH PROPOFOL N/A 11/18/2020   Procedure: COLONOSCOPY WITH PROPOFOL;  Surgeon: ArYetta FlockMD;  [facility];  Service: Gastroenterology;  Laterality: N/A;   CORONARY ARTERY BYPASS GRAFT  2006   ESOPHAGOGASTRODUODENOSCOPY (EGD) WITH PROPOFOL N/A 11/18/2020   Procedure: ESOPHAGOGASTRODUODENOSCOPY (EGD) WITH PROPOFOL;  Surgeon: ArYetta FlockMD;  [facility];  Service: Gastroenterology;  Laterality: N/A;   INTRAOPERATIVE TRANSTHORACIC ECHOCARDIOGRAM N/A 03/16/2022   Procedure: INTRAOPERATIVE TRANSTHORACIC ECHOCARDIOGRAM;  Surgeon: CoSherren MochaMD;  [facility];  Service: Open Heart Surgery;  Laterality: N/A;   LEFT ATRIAL APPENDAGE OCCLUSION N/A 12/17/2021   Procedure: LEFT ATRIAL APPENDAGE OCCLUSION;  Surgeon: CoSherren MochaMD;  [facility];  Service: Cardiovascular;  Laterality: N/A;   POLYPECTOMY     POLYPECTOMY  11/18/2020   Procedure: POLYPECTOMY;  Surgeon: ArYetta FlockMD;  [facility];  Service: Gastroenterology;;   RIGHT/LEFT HEART CATH AND CORONARY/GRAFT ANGIOGRAPHY N/A  02/17/2022   Procedure: RIGHT/LEFT HEART CATH AND CORONARY/GRAFT ANGIOGRAPHY;  Surgeon: [name], MD;  Location: [place] [facility];  Service: Cardiovascular;  Laterality: N/A;   TEE WITHOUT CARDIOVERSION N/A 12/17/2021   Procedure: TRANSESOPHAGEAL ECHOCARDIOGRAM (TEE);  Surgeon: [name], MD;  Location: [place] [facility];  Service: Cardiovascular;  Laterality: N/A;   TRANSCATHETER AORTIC VALVE REPLACEMENT, TRANSFEMORAL Right 03/16/2022   Procedure: Transcatheter Aortic Valve Replacement, Transfemoral;  Surgeon: [name], MD;  Location: [place] [facility];  Service: Open Heart Surgery;  Laterality: Right;   Social History   Social History Narrative   Not  on file   Immunization History  Administered Date(s) Administered   Fluad Quad(high Dose 65+) 11/29/2018, 11/30/2019, 11/06/2020, 11/23/2021   Hep A / Hep B 10/10/2020   Hepatitis A, Adult 11/10/2020   Hepb-cpg 11/10/2020, 12/11/2020, 05/11/2021   Influenza Split 10/26/2011, 11/01/2017   Influenza Whole 12/03/2008   PFIZER(Purple Top)SARS-COV-2 Vaccination 04/16/2019, 05/08/2019   Pneumococcal Conjugate-13 07/04/2017   Pneumococcal Polysaccharide-23 11/29/2018   Tdap 09/29/2010, 02/12/2021     Objective: Vital Signs: BP (!) 157/59 (BP Location: Left Arm, Patient Position: Sitting, Cuff Size: Normal)   Pulse 69   Resp 17   Ht '5\' 4"'$  (1.626 m)   Wt 211 lb (95.7 kg)   BMI 36.22 kg/m    Physical Exam Vitals and nursing note reviewed.  Constitutional:      Appearance: He is well-developed.  HENT:     Head: Normocephalic and atraumatic.  Eyes:     Conjunctiva/sclera: Conjunctivae normal.     Pupils: Pupils are equal, round, and reactive to light.  Cardiovascular:     Rate and Rhythm: Normal rate and regular rhythm.     Heart sounds: Murmur heard.  Pulmonary:     Effort: Pulmonary effort is normal.     Breath sounds: Rales present.  Abdominal:     General: Bowel sounds are normal.     Palpations: Abdomen is soft.  Musculoskeletal:     Cervical back: Normal range of motion and neck supple.     Right lower leg: Edema present.     Left lower leg: Edema present.  Skin:    General: Skin is warm and dry.     Capillary Refill: Capillary refill takes less than 2 seconds.  Neurological:     Mental Status: He is alert and oriented to person, place, and time.  Psychiatric:        Behavior: Behavior normal.      Musculoskeletal Exam: Cervical spine was in good range of motion.  Shoulders, elbow joints, wrist joints, MCPs PIPs and DIPs Juengel range of motion with no synovitis.  Hip joints and knee joints were in good range of motion without any warmth swelling or effusion.  He  had severe bilateral lower extremity edema.  Ankle joints were difficult to assess but no warmth was noted.  There was no tenderness over MTPs or PIPs.  CDAI Exam: CDAI Score: -- Patient Global: --; Provider Global: -- Swollen: --; Tender: -- Joint Exam 04/15/2022   No joint exam has been documented for this visit   There is currently no information documented on the homunculus. Go to the Rheumatology activity and complete the homunculus joint exam.  Investigation: No additional findings.  Imaging: ECHOCARDIOGRAM COMPLETE  Result Date: 03/17/2022    ECHOCARDIOGRAM REPORT   Patient Name:   ABDALRAHMAN MANFORD [facility] Date of Exam: 03/17/2022 Medical Rec #:  [id number]  Height:       66.0 in Accession #:    [id number]       Weight:       215.6 lb Date of Birth:  09/08/1950        BSA:          2.065 m Patient Age:    24 years         BP:           109/45 mmHg Patient Gender: M                HR:           63 bpm. Exam Location:  Inpatient Procedure: 2D Echo, Cardiac Doppler, Color Doppler and Intracardiac            Opacification Agent Indications:    Post TAVR evaluation  History:        Patient has prior history of Echocardiogram examinations, most                 recent 03/16/2022. CHF, Previous Myocardial Infarction and CAD,                 Aortic Valve Disease; Risk Factors:Sleep Apnea, Hypertension and                 Dyslipidemia. CKD.                 Aortic Valve: 26 mm Edwards Ultra, stented (TAVR) valve is                 present in the aortic position. Procedure Date: 03/16/22.  Sonographer:    Eartha Inch Referring Phys: [phone] JILL D MCDANIEL  Sonographer Comments: Technically difficult study due to poor echo windows. Image acquisition challenging due to patient body habitus and Image acquisition challenging due to respiratory motion. IMPRESSIONS  1. Left ventricular ejection fraction, by estimation, is 45 to 50%. The left ventricle has mildly decreased function. The left ventricle  demonstrates global hypokinesis. Left ventricular diastolic function could not be evaluated. Elevated left ventricular end-diastolic pressure.  2. Right ventricular systolic function is normal. The right ventricular size is normal. There is normal pulmonary artery systolic pressure. The estimated right ventricular systolic pressure is 123456 mmHg.  3. Left atrial size was moderately dilated.  4. The mitral valve is degenerative. Trivial mitral valve regurgitation. No evidence of mitral stenosis. Moderate mitral annular calcification.  5. The aortic valve has been repaired/replaced. Aortic valve regurgitation is not visualized. No aortic stenosis is present. There is a 26 mm Edwards Ultra, stented (TAVR) valve present in the aortic position. Procedure Date: 03/16/22. Aortic valve area,  by VTI measures 2.12 cm. Aortic valve mean gradient measures 15.5 mmHg. Aortic valve Vmax measures 2.60 m/s.  6. The inferior vena cava is dilated in size with <50% respiratory variability, suggesting right atrial pressure of 15 mmHg.  7. Compared to immdediate post TAVR study 03/16/22, the mean TAVR gradient has increased from 121mHg to 124m, Vmax has increased from 2.2 to 2.21m34mand DVI has increased from 0.57 to 0.67. FINDINGS  Left Ventricle: Left ventricular ejection fraction, by estimation, is 45 to 50%. The left ventricle has mildly decreased function. The left ventricle demonstrates global hypokinesis. Definity contrast agent was given IV to delineate the left ventricular  endocardial borders. The left ventricular internal cavity size was normal in size. There is no left ventricular hypertrophy. Left ventricular diastolic function could not be evaluated. Elevated left ventricular end-diastolic  pressure. Right Ventricle: The right ventricular size is normal. No increase in right ventricular wall thickness. Right ventricular systolic function is normal. There is normal pulmonary artery systolic pressure. The tricuspid regurgitant  velocity is 2.12 m/s, and  with an assumed right atrial pressure of 15 mmHg, the estimated right ventricular systolic pressure is 123456 mmHg. Left Atrium: Left atrial size was moderately dilated. Right Atrium: Right atrial size was normal in size. Pericardium: There is no evidence of pericardial effusion. Mitral Valve: The mitral valve is degenerative in appearance. There is mild thickening of the mitral valve leaflet(s). Moderate mitral annular calcification. Trivial mitral valve regurgitation. No evidence of mitral valve stenosis. MV peak gradient, 11.4  mmHg. The mean mitral valve gradient is 4.0 mmHg. Tricuspid Valve: The tricuspid valve is normal in structure. Tricuspid valve regurgitation is mild . No evidence of tricuspid stenosis. Aortic Valve: The aortic valve has been repaired/replaced. Aortic valve regurgitation is not visualized. No aortic stenosis is present. Aortic valve mean gradient measures 15.5 mmHg. Aortic valve peak gradient measures 26.9 mmHg. Aortic valve area, by VTI measures 2.12 cm. There is a 26 mm Edwards Ultra, stented (TAVR) valve present in the aortic position. Procedure Date: 03/16/22. Pulmonic Valve: The pulmonic valve was normal in structure. Pulmonic valve regurgitation is not visualized. No evidence of pulmonic stenosis. Aorta: The aortic root is normal in size and structure. Venous: The inferior vena cava is dilated in size with less than 50% respiratory variability, suggesting right atrial pressure of 15 mmHg. IAS/Shunts: No atrial level shunt detected by color flow Doppler.  LEFT VENTRICLE PLAX 2D LVIDd:         4.80 cm      Diastology LVIDs:         4.00 cm      LV e' medial:    4.12 cm/s LV PW:         1.00 cm      LV E/e' medial:  37.5 LV IVS:        0.90 cm      LV e' lateral:   4.67 cm/s LVOT diam:     2.00 cm      LV E/e' lateral: 33.1 LV SV:         134 LV SV Index:   65 LVOT Area:     3.14 cm  LV Volumes (MOD) LV vol d, MOD A2C: 126.0 ml LV vol d, MOD A4C: 224.0 ml LV  vol s, MOD A2C: 62.6 ml LV vol s, MOD A4C: 115.0 ml LV SV MOD A2C:     63.4 ml LV SV MOD A4C:     224.0 ml LV SV MOD BP:      81.9 ml RIGHT VENTRICLE            IVC RV S prime:     8.38 cm/s  IVC diam: 3.20 cm TAPSE (M-mode): 1.4 cm LEFT ATRIUM              Index        RIGHT ATRIUM           Index LA diam:        5.40 cm  2.62 cm/m   RA Area:     15.90 cm LA Vol (A2C):   81.8 ml  39.62 ml/m  RA Volume:   38.60 ml  18.70 ml/m LA Vol (A4C):   103.0 ml 49.89 ml/m LA Biplane Vol: 91.8 ml  44.46 ml/m  AORTIC VALVE AV Area (  Vmax):    2.00 cm AV Area (Vmean):   2.10 cm AV Area (VTI):     2.12 cm AV Vmax:           259.50 cm/s AV Vmean:          185.500 cm/s AV VTI:            0.635 m AV Peak Grad:      26.9 mmHg AV Mean Grad:      15.5 mmHg LVOT Vmax:         165.00 cm/s LVOT Vmean:        124.000 cm/s LVOT VTI:          0.428 m LVOT/AV VTI ratio: 0.67  AORTA Ao Root diam: 2.50 cm Ao Asc diam:  3.50 cm MITRAL VALVE                TRICUSPID VALVE MV Area (PHT): 2.33 cm     TR Peak grad:   18.0 mmHg MV Area VTI:   2.55 cm     TR Mean grad:   14.0 mmHg MV Peak grad:  11.4 mmHg    TR Vmax:        212.00 cm/s MV Mean grad:  4.0 mmHg     TR Vmean:       180.0 cm/s MV Vmax:       1.69 m/s MV Vmean:      89.7 cm/s    SHUNTS MV Decel Time: 326 msec     Systemic VTI:  0.43 m MR Peak grad: 18.5 mmHg     Systemic Diam: 2.00 cm MR Mean grad: 11.0 mmHg MR Vmax:      215.00 cm/s MR Vmean:     151.0 cm/s MV E velocity: 154.50 cm/s Fransico Him MD Electronically signed by Fransico Him MD Signature Date/Time: 03/17/2022/12:51:20 PM    Final    DG CHEST PORT 1 VIEW  Result Date: 03/17/2022 CLINICAL DATA:  Hypoxia EXAM: PORTABLE CHEST 1 VIEW COMPARISON:  CT chest dated 02/24/2022 FINDINGS: Mild subpleural scarring/fibrosis in the lung bases, favoring chronic interstitial lung disease. No focal consolidation. No pleural effusion or pneumothorax. Cardiomegaly.  Postsurgical changes related to prior CABG. Median sternotomy.  IMPRESSION: No evidence of acute cardiopulmonary disease. Mild chronic interstitial lung disease. Electronically Signed   By: Julian Hy M.D.   On: 03/17/2022 12:15   ECHOCARDIOGRAM LIMITED  Result Date: 03/16/2022    ECHOCARDIOGRAM LIMITED REPORT   Patient Name:   TANAV PETTIJOHN Date of Exam: 03/16/2022 Medical Rec #:  [id number]        Height:       66.0 in Accession #:    [id number]       Weight:       212.0 lb Date of Birth:  27-Nov-1950        BSA:          2.050 m Patient Age:    89 years         BP:           132/57 mmHg Patient Gender: M                HR:           68 bpm. Exam Location:  Inpatient Procedure: Limited Echo, Cardiac Doppler and Color Doppler Indications:     I35.0 Nonrheumatic aortic (valve) stenosis  History:         Patient has prior history of Echocardiogram examinations,  most                  recent 01/19/2022. Previous Myocardial Infarction, Abnormal                  ECG, Aortic Valve Disease, Arrythmias:Atrial Fibrillation and                  Atrial Flutter, Signs/Symptoms:Dyspnea and Shortness of Breath;                  Risk Factors:Hypertension and Dyslipidemia. Aortic stenosis.                  Watchman device.                  Aortic Valve: 26 mm Sapien prosthetic, stented (TAVR) valve is                  present in the aortic position. Procedure Date: 03/16/2022.  Sonographer:     Roseanna Rainbow RDCS Referring Phys:  [name] Diagnosing Phys: Jenkins Rouge MD IMPRESSIONS  1. Septal apical and inferior wall hypokinesis . Left ventricular ejection fraction, by estimation, is 45 to 50%. The left ventricle has mildly decreased function. The left ventricle demonstrates regional wall motion abnormalities (see scoring diagram/findings for description). The left ventricular internal cavity size was mildly dilated.  2. The mitral valve is abnormal. Trivial mitral valve regurgitation. Moderate mitral annular calcification.  3. Pre TAVR tri leaflet AV with severe AS mean gradient  27 peak 46 mmHg AVA 1.6 cm2 supine in cath lab         Post TAVR: well place 26 mm Sapien 3 valve with no significant PVL mean gradient 11 peak 20 mmHg AVI 3.1 cm2. The aortic valve has been repaired/replaced. There is a 26 mm Sapien prosthetic (TAVR) valve present in the aortic position. Procedure Date:  03/16/2022. FINDINGS  Left Ventricle: Septal apical and inferior wall hypokinesis. Left ventricular ejection fraction, by estimation, is 45 to 50%. The left ventricle has mildly decreased function. The left ventricle demonstrates regional wall motion abnormalities. The left ventricular internal cavity size was mildly dilated. Mitral Valve: The mitral valve is abnormal. There is mild thickening of the mitral valve leaflet(s). There is mild calcification of the mitral valve leaflet(s). Moderate mitral annular calcification. Trivial mitral valve regurgitation. Tricuspid Valve: The tricuspid valve is normal in structure. Tricuspid valve regurgitation is mild. Aortic Valve: Pre TAVR tri leaflet AV with severe AS mean gradient 27 peak 46 mmHg AVA 1.6 cm2 supine in cath lab Post TAVR: well place 26 mm Sapien 3 valve with no significant PVL mean gradient 11 peak 20 mmHg AVI 3.1 cm2. The aortic valve has been repaired/replaced. Aortic valve mean gradient measures 11.0 mmHg. Aortic valve peak gradient measures 19.7 mmHg. Aortic valve area, by VTI measures 3.05 cm. There is a 26 mm Sapien prosthetic, stented (TAVR) valve present in the aortic position. Procedure Date: 03/16/2022. Additional Comments: Spectral Doppler performed. Color Doppler performed.  LEFT VENTRICLE PLAX 2D LVIDd:         5.40 cm LVIDs:         3.90 cm LV PW:         1.00 cm LV IVS:        1.30 cm LVOT diam:     2.60 cm LV SV:         157 LV SV Index:   77 LVOT Area:     5.31 cm  LV Volumes (MOD) LV vol d, MOD A2C: 164.0 ml LV vol d, MOD A4C: 197.0 ml LV vol s, MOD A2C: 78.5 ml LV vol s, MOD A4C: 79.9 ml LV SV MOD A2C:     85.5 ml LV SV MOD A4C:     197.0  ml LV SV MOD BP:      106.3 ml AORTIC VALVE AV Area (Vmax):    3.20 cm AV Area (Vmean):   3.12 cm AV Area (VTI):     3.05 cm AV Vmax:           222.00 cm/s AV Vmean:          151.500 cm/s AV VTI:            0.516 m AV Peak Grad:      19.7 mmHg AV Mean Grad:      11.0 mmHg LVOT Vmax:         134.00 cm/s LVOT Vmean:        89.100 cm/s LVOT VTI:          0.296 m LVOT/AV VTI ratio: 0.57  SHUNTS Systemic VTI:  0.30 m Systemic Diam: 2.60 cm Jenkins Rouge MD Electronically signed by Jenkins Rouge MD Signature Date/Time: 03/16/2022/3:38:34 PM    Final    Structural Heart Procedure  Result Date: 03/16/2022 See surgical note for result.   Recent Labs: Lab Results  Component Value Date   WBC 5.4 03/21/2022   HGB 12.3 (L) 03/21/2022   PLT 41 (L) 03/21/2022   NA 143 03/29/2022   K 4.1 03/29/2022   CL 103 03/29/2022   CO2 25 03/29/2022   GLUCOSE 127 (H) 03/29/2022   BUN 22 03/29/2022   CREATININE 1.06 03/29/2022   BILITOT 3.0 (H) 03/12/2022   ALKPHOS 63 03/12/2022   AST 29 03/12/2022   ALT 19 03/12/2022   PROT 6.7 03/12/2022   ALBUMIN 2.8 (L) 03/12/2022   CALCIUM 8.7 03/29/2022   GFRAA 101 11/22/2019   September 23, 2021 RF 382, anti-CCP negative, ANA negative, dsDNA negative 2022 hepatitis A negative 01/07/2020 hepatitis B-, hepatitis C negative  Speciality Comments: No specialty comments available.  Procedures:  No procedures performed Allergies: Patient has no known allergies.   Assessment / Plan:     Visit Diagnoses: Rheumatoid factor positive-patient had positive rheumatoid factor III 182 on September 23, 2021.  Anti-CCP was negative.  He had no synovitis on my examination today.  He denies any history of joint pain or joint inflammation.  Positive rheumatoid factor can also be seen with hepatitis.  On chart review his hepatitis A was negative in 2022.  Hepatitis B and hepatitis C were negative in December 2021.  At this point he does not have any clinical features of inflammatory  arthritis.  ILD (interstitial lung disease) (HCC)-patient was diagnosed with UIP by Dr. Vaughan Browner.  He was also started on pirfenidone.  Pirfenidone was stopped while he was in the hospital.  He is currently on Solu-Medrol.  He continues to have some shortness of breath on exertion.  He uses oxygen at nighttime.  I am uncertain if his ILD is related to positive rheumatoid factor.  If the relationship between rheumatoid factor and ILD is established then an immunosuppressive agent could be helpful.  I did detailed discussion with the patient.  He voiced understanding.  Thrombocytopenia-patient has chronic thrombocytopenia for many years.  He was evaluated by Dr. Lorenso Courier.  According to Dr. Libby Maw notes thrombocytopenia was related to cirrhosis of liver.  Patient cannot take anticoagulants.  ANA and double-stranded DNA were negative.  Dyslipidemia-he is on Lipitor.  Essential hypertension-his blood pressure was elevated today at 157/59.  Repeat blood pressure was 134/53.  Patient was advised to monitor blood pressure closely and follow-up with his cardiologist.  MYOCARDIAL INFARCTION, HX OF-followed by cardiology.  Atherosclerosis of native coronary artery of native heart without angina pectoris  PAF (paroxysmal atrial fibrillation) (HCC)  S/P TAVR (transcatheter aortic valve replacement)  Chronic combined systolic and diastolic heart failure (HCC)  Impaired glucose tolerance  Other cirrhosis of liver (HCC)  Secondary esophageal varices without bleeding (HCC)  Gastritis and gastroduodenitis  Chronic respiratory failure with hypoxia (HCC) - on oxygen 2-4 L per nasal canula in the evening.  NEPHROLITHIASIS, HX OF  Class 1 obesity due to excess calories with serious comorbidity and body mass index (BMI) of 33.0 to 33.9 in adult  Orders: No orders of the defined types were placed in this encounter.  No orders of the defined types were placed in this encounter.    Follow-Up  Instructions: Return if symptoms worsen or fail to improve, for ILD, +RF.   Bo Merino, MD  Note - This record has been created using Editor, commissioning.  Chart creation errors have been sought, but may not always  have been located. Such creation errors do not reflect on  the standard of medical care.

## 2022-04-02 NOTE — Progress Notes (Signed)
Pt has been made aware of normal result and verbalized understanding.  jw

## 2022-04-08 ENCOUNTER — Ambulatory Visit (HOSPITAL_COMMUNITY): Payer: 59 | Attending: Cardiology | Admitting: Physical Therapy

## 2022-04-08 DIAGNOSIS — R0602 Shortness of breath: Secondary | ICD-10-CM | POA: Insufficient documentation

## 2022-04-08 NOTE — Therapy (Signed)
[facility] at [place], [place], [address] Phone: [phone]   Fax:  [phone]  Patient Details  Name: [name] MRN: [id number] Date of Birth: [birth year] Referring Provider:  Isaac Bliss, [place]*  Encounter Date: 04/08/2022  Patient presents for therapy evaluation s/p TAVR in January. His chief complaint at this time is shortness of breath and decreased activity tolerance due to cardiac associated fatigue. Patient also has been referred for cardiopulmonary rehab but has not yet set up evaluation. Performed screen today and patient demos 5/5 MMT throughout Les in seated position and is able to perform 5 x sit to stand test in under 12 seconds. At this time, it does not appear physical therapy is the most appropriate venue of care given patient chief complaint. Discussed cardio referral and location and provided phone number for that office to go ahead and scheduled initial evaluation there. Encouraged patient to follow up with PT services as needed with any further questions or concerns.   1:35 PM, 04/08/22 Josue Hector PT DPT  Physical Therapist with [facility]  [phone]    [facility] at [place], [place], [address] Phone: [phone]   Fax:  [phone]

## 2022-04-13 ENCOUNTER — Other Ambulatory Visit: Payer: Self-pay | Admitting: Cardiology

## 2022-04-13 DIAGNOSIS — Z952 Presence of prosthetic heart valve: Secondary | ICD-10-CM

## 2022-04-13 DIAGNOSIS — E854 Organ-limited amyloidosis: Secondary | ICD-10-CM

## 2022-04-15 ENCOUNTER — Encounter: Payer: Self-pay | Admitting: Rheumatology

## 2022-04-15 ENCOUNTER — Encounter: Payer: 59 | Admitting: Internal Medicine

## 2022-04-15 ENCOUNTER — Ambulatory Visit: Payer: 59 | Attending: Rheumatology | Admitting: Rheumatology

## 2022-04-15 VITALS — BP 157/59 | HR 69 | Resp 17 | Ht 64.0 in | Wt 211.0 lb

## 2022-04-15 DIAGNOSIS — R768 Other specified abnormal immunological findings in serum: Secondary | ICD-10-CM | POA: Diagnosis not present

## 2022-04-15 DIAGNOSIS — E6609 Other obesity due to excess calories: Secondary | ICD-10-CM

## 2022-04-15 DIAGNOSIS — I5042 Chronic combined systolic (congestive) and diastolic (congestive) heart failure: Secondary | ICD-10-CM

## 2022-04-15 DIAGNOSIS — E785 Hyperlipidemia, unspecified: Secondary | ICD-10-CM | POA: Diagnosis not present

## 2022-04-15 DIAGNOSIS — K2970 Gastritis, unspecified, without bleeding: Secondary | ICD-10-CM

## 2022-04-15 DIAGNOSIS — K7469 Other cirrhosis of liver: Secondary | ICD-10-CM

## 2022-04-15 DIAGNOSIS — I8510 Secondary esophageal varices without bleeding: Secondary | ICD-10-CM

## 2022-04-15 DIAGNOSIS — F4321 Adjustment disorder with depressed mood: Secondary | ICD-10-CM

## 2022-04-15 DIAGNOSIS — Z952 Presence of prosthetic heart valve: Secondary | ICD-10-CM

## 2022-04-15 DIAGNOSIS — I10 Essential (primary) hypertension: Secondary | ICD-10-CM

## 2022-04-15 DIAGNOSIS — I480 Paroxysmal atrial fibrillation: Secondary | ICD-10-CM

## 2022-04-15 DIAGNOSIS — D696 Thrombocytopenia, unspecified: Secondary | ICD-10-CM

## 2022-04-15 DIAGNOSIS — J9611 Chronic respiratory failure with hypoxia: Secondary | ICD-10-CM

## 2022-04-15 DIAGNOSIS — Z6833 Body mass index (BMI) 33.0-33.9, adult: Secondary | ICD-10-CM

## 2022-04-15 DIAGNOSIS — I2510 Atherosclerotic heart disease of native coronary artery without angina pectoris: Secondary | ICD-10-CM

## 2022-04-15 DIAGNOSIS — I252 Old myocardial infarction: Secondary | ICD-10-CM

## 2022-04-15 DIAGNOSIS — R7689 Other specified abnormal immunological findings in serum: Secondary | ICD-10-CM

## 2022-04-15 DIAGNOSIS — K2990 Gastroduodenitis, unspecified, without bleeding: Secondary | ICD-10-CM

## 2022-04-15 DIAGNOSIS — J849 Interstitial pulmonary disease, unspecified: Secondary | ICD-10-CM | POA: Diagnosis not present

## 2022-04-15 DIAGNOSIS — Z87442 Personal history of urinary calculi: Secondary | ICD-10-CM

## 2022-04-15 DIAGNOSIS — R7302 Impaired glucose tolerance (oral): Secondary | ICD-10-CM

## 2022-04-19 ENCOUNTER — Telehealth (HOSPITAL_COMMUNITY): Payer: Self-pay | Admitting: *Deleted

## 2022-04-19 NOTE — Telephone Encounter (Signed)
Patient reminded of upcoming Amyloid appointment.  [name]

## 2022-04-19 NOTE — Progress Notes (Signed)
HPI: FU CAD, H/O TAVR and atrial flutter/fibrillation; history of coronary artery disease, status post coronary artery bypass graft surgery performed in June 2006. Patient had a LIMA to the LAD, saphenous vein graft to the diagonal, saphenous vein graft to the acute marginal and saphenous vein graft to the PDA. Carotid Dopplers August 2015 showed no significant stenosis. Patient also with h/o atrial fibrillation/atrial flutter. Monitor December 2021 showed sinus rhythm with rare PAC and PVC.  Abdominal ultrasound June 2022 showed no aneurysm. 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  CT for morphology prior to left atrial appendage occlusion October 2023 showed left lower lobe nodule and follow-up recommended 3 months.  Cardiac catheterization January 2024 revealed moderate left main stenosis, occluded LAD, patent left circumflex and moderate stenosis in the right coronary artery.  PA pressure 57/9 and left ventricular end-diastolic pressure 14.  The LIMA to the LAD and saphenous vein graft to the first diagonal patent; saphenous vein graft to OM and saphenous vein graft to the PDA occluded.  Had TAVR March 16, 2022 with 26 mm Edwards ultra stented valve.   Note postprocedure course complicated by volume excess and hypoxemia.  Echocardiogram repeated today and showed normal LV function, mild left ventricular hypertrophy, grade 2 diastolic dysfunction, severe biatrial enlargement, mild mitral regurgitation, status post aortic valve replacement with mean gradient 18.5 mmHg and trace aortic insufficiency.  Since last seen he does have dyspnea on exertion when he is not wearing his oxygen.  He feels better with O2 in place.  There is no orthopnea or PND but he has had bilateral lower extremity edema that is worse.  No chest pain or syncope.  Current Outpatient Medications   Medication Sig Dispense Refill   acetaminophen (TYLENOL) 500 MG tablet Take 1,000 mg by mouth daily as needed for moderate pain or headache.     amoxicillin (AMOXIL) 500 MG tablet Take 4 tablets (2,000 mg total) by mouth as directed. 1 HOUR PRIOR TO DENTAL APPOINTMENTS 12 tablet 6   aspirin EC 81 MG tablet Take 1 tablet (81 mg total) by mouth daily. Swallow whole. 30 tablet 12   atorvastatin (LIPITOR) 80 MG tablet TAKE 1 TABLET BY MOUTH EVERY DAY 90 tablet 1   carvedilol (COREG) 3.125 MG tablet TAKE 1 TABLET BY MOUTH TWICE A DAY WITH FOOD 60 tablet 5   CVS VITAMIN B12 1000 MCG tablet TAKE 1 TABLET BY MOUTH EVERY DAY 180 tablet 1   empagliflozin (JARDIANCE) 10 MG TABS tablet Take 1 tablet (10 mg total) by mouth daily before breakfast. 30 tablet 11   furosemide (LASIX) 40 MG tablet Take 2 tablets (80 mg total) by mouth 2 (two) times daily. 360 tablet 3   KLOR-CON M20 20 MEQ tablet TAKE 1 TABLET BY MOUTH EVERY DAY 30 tablet 11   Misc Natural Products (LIVER PROTECT PO) Take 1 capsule by mouth 2 (two) times daily. From [employer]     predniSONE (DELTASONE) 20 MG tablet Take 2 tablets (40 mg total) by mouth daily with breakfast for 7 days, THEN 1.5 tablets (30 mg total) daily with breakfast for 7 days, THEN 1 tablet (20 mg total) daily with breakfast for 7 days, THEN 0.5 tablets (10 mg total) daily with breakfast. 47 tablet 0   tamsulosin (FLOMAX) 0.4 MG CAPS capsule TAKE ONE CAPSULE BY MOUTH EVERY DAY AFTER SUPPER 90 capsule 1   No  current facility-administered medications for this visit.     Past Medical History:  Diagnosis Date   Adjustment disorder with depressed mood 09/07/2007   Cataract    removed bilaterally   Chronic kidney disease    kidney stones   Cirrhosis ([place])    Colon polyps    Tubular Adenoma 2010   CONGESTIVE HEART FAILURE 12/09/2008   CORONARY ARTERY DISEASE 2006   HYPERLIPIDEMIA 08/09/2006   HYPERTENSION 08/09/2006   MYOCARDIAL INFARCTION, HX OF 07/07/2004    NEPHROLITHIASIS, HX OF 08/09/2006   OBESITY 07/19/2008   Presence of Watchman left atrial appendage closure device 12/17/2021   42mm Watchman with Dr. Burt Knack   S/P TAVR (transcatheter aortic valve replacement) 03/16/2022   70mm S3UR via TF approach with Dr. Burt Knack and Dr. Lavonna Monarch   Sleep apnea    has a cpap - not using religiously    Past Surgical History:  Procedure Laterality Date   BIOPSY  11/18/2020   Procedure: BIOPSY;  Surgeon: Yetta Flock, MD;  Location: [facility];  Service: Gastroenterology;;   CARDIOVERSION N/A 12/14/2017   Procedure: CARDIOVERSION;  Surgeon: [name], MD;  Location: [facility];  Service: Cardiovascular;  Laterality: N/A;   CARDIOVERSION N/A 10/01/2019   Procedure: CARDIOVERSION;  Surgeon: Buford Dresser, MD;  Location[facility];  Service: Cardiovascular;  Laterality: N/A;   COLONOSCOPY     COLONOSCOPY WITH PROPOFOL N/A 11/18/2020   Procedure: COLONOSCOPY WITH PROPOFOL;  Surgeon: Yetta Flock, MD;  Location: [facility];  Service: Gastroenterology;  Laterality: N/A;   CORONARY ARTERY BYPASS GRAFT  2006   ESOPHAGOGASTRODUODENOSCOPY (EGD) WITH PROPOFOL N/A 11/18/2020   Procedure: ESOPHAGOGASTRODUODENOSCOPY (EGD) WITH PROPOFOL;  Surgeon: Yetta Flock, MD;  Location: [facility];  Service: Gastroenterology;  Laterality: N/A;   INTRAOPERATIVE TRANSTHORACIC ECHOCARDIOGRAM N/A 03/16/2022   Procedure: INTRAOPERATIVE TRANSTHORACIC ECHOCARDIOGRAM;  Surgeon: [name], MD;  Location: [facility];  Service: Open Heart Surgery;  Laterality: N/A;   LEFT ATRIAL APPENDAGE OCCLUSION N/A 12/17/2021   Procedure: LEFT ATRIAL APPENDAGE OCCLUSION;  Surgeon: [name], MD;  Location: [facility];  Service: Cardiovascular;  Laterality: N/A;   POLYPECTOMY     POLYPECTOMY  11/18/2020   Procedure: POLYPECTOMY;  Surgeon: Yetta Flock, MD;  Location: [facility];  Service: Gastroenterology;;    RIGHT/LEFT HEART CATH AND CORONARY/GRAFT ANGIOGRAPHY N/A 02/17/2022   Procedure: RIGHT/LEFT HEART CATH AND CORONARY/GRAFT ANGIOGRAPHY;  Surgeon: [name], MD;  Location: [facility];  Service: Cardiovascular;  Laterality: N/A;   TEE WITHOUT CARDIOVERSION N/A 12/17/2021   Procedure: TRANSESOPHAGEAL ECHOCARDIOGRAM (TEE);  Surgeon: [name], MD;  Location: [facility];  Service: Cardiovascular;  Laterality: N/A;   TRANSCATHETER AORTIC VALVE REPLACEMENT, TRANSFEMORAL Right 03/16/2022   Procedure: Transcatheter Aortic Valve Replacement, Transfemoral;  Surgeon: [name], MD;  Location: [facility];  Service: Open Heart Surgery;  Laterality: Right;    Social History   Socioeconomic History   Marital status: Significant Other    Spouse name: Not on file   Number of children: 0   Years of education: Not on file   Highest education level: Not on file  Occupational History   Occupation: Passenger transport manager  Tobacco Use   Smoking status: Never    Passive exposure: Never   Smokeless tobacco: Never  Vaping Use   Vaping Use: Never used  Substance and Sexual Activity   Alcohol use: Not Currently    Comment: occasional   Drug use: No  Sexual activity: Not on file  Other Topics Concern   Not on file  Social History Narrative   Not on file   Social Determinants of Health   Financial Resource Strain: Not on file  Food Insecurity: No Food Insecurity (12/17/2021)   Hunger Vital Sign    Worried About Running Out of Food in the Last Year: Never true    Ran Out of Food in the Last Year: Never true  Transportation Needs: No Transportation Needs (12/17/2021)   PRAPARE - Hydrologist (Medical): No    Lack of Transportation (Non-Medical): No  Physical Activity: Not on file  Stress: Not on file  Social Connections: Not on file  Intimate Partner Violence: Not At Risk (12/17/2021)   Humiliation, Afraid, Rape, and  Kick questionnaire    Fear of Current or Ex-Partner: No    Emotionally Abused: No    Physically Abused: No    Sexually Abused: No    Family History  Problem Relation Age of Onset   Hypertension Mother    Hyperlipidemia Mother    Heart disease Father    Ulcers Father    Bipolar disorder Brother    Colon cancer Neg Hx    Colon polyps Neg Hx    Esophageal cancer Neg Hx    Rectal cancer Neg Hx    Stomach cancer Neg Hx     ROS: no fevers or chills, productive cough, hemoptysis, dysphasia, odynophagia, melena, hematochezia, dysuria, hematuria, rash, seizure activity, orthopnea, PND, claudication. Remaining systems are negative.  Physical Exam: Well-developed well-nourished in no acute distress.  Skin is warm and dry.  HEENT is normal.  Neck is supple.  Chest is clear to auscultation with normal expansion.  Cardiovascular exam is regular rate and rhythm.  2/6 systolic murmur left sternal border.  No diastolic murmur noted. Abdominal exam nontender or distended. No masses palpated. Extremities show 3-4+ edema. neuro grossly intact  ECG-normal sinus rhythm at a rate of 73, first-degree AV block, left anterior fascicular block, right bundle branch block, cannot rule out septal infarct, left ventricular hypertrophy.  Personally reviewed  A/P  1 status post TAVR-continue SBE prophylaxis.  Most recent echocardiogram showed normally functioning aortic valve.   2 chronic diastolic congestive heart failure-he is volume overloaded on examination today.  I will discontinue Lasix.  Begin Demadex 60 mg twice daily.  Check potassium and renal function in 1 week.  I will have him seen next week to make sure that his volume status is improving.  We did discuss hospital admission today but he would prefer to be at home with possible.  3 paroxysmal atrial fibrillation-he is in sinus rhythm.  He is status post watchman and therefore is not on anticoagulation.  Will continue aspirin.  4 pulmonary  fibrosis-this is clearly contributing to his hypoxemia.  Follow-up pulmonary.  Note he is somewhat hypoxic and notes dyspnea on exertion.  I will ask him to wear his oxygen at all times.  5 nonalcoholic cirrhosis with splenomegaly, portal venous hypertension/thrombocytopenia-Per primary care.  6 coronary artery disease-continue aspirin and statin.  7 question amyloidosis-PYP scan pending.  Also to be evaluated by hematology.  Kirk Ruths, MD

## 2022-04-20 ENCOUNTER — Ambulatory Visit: Payer: 59 | Admitting: Pulmonary Disease

## 2022-04-20 ENCOUNTER — Encounter: Payer: Self-pay | Admitting: Pulmonary Disease

## 2022-04-20 VITALS — BP 140/62 | HR 70 | Temp 97.8°F | Ht 66.0 in | Wt 212.2 lb

## 2022-04-20 DIAGNOSIS — J849 Interstitial pulmonary disease, unspecified: Secondary | ICD-10-CM | POA: Diagnosis not present

## 2022-04-20 NOTE — Progress Notes (Signed)
TERRYL TUBB    [id number]    12/23/1950  Primary Care Physician:Hernandez Everardo Beals, MD  Referring Physician: Isaac Bliss, Rayford Halsted, MD [address] [place],   [address]  Chief complaint: Follow up for interstitial lung disease  HPI: 72 y.o. who has past medical history of hypertension, coronary artery disease, paroxysmal atrial fibrillation on Eliquis anticoagulation, aortic stenosis, sleep apnea on CPAP, cirrhosis with varices secondary to [facility]  Referred for evaluation of dyspnea on exertion for several years.  He recently had a cardiac CT which showed interstitial changes Denies any cough, chest congestion.  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  Interim history: He was prescribed Esbriet at last visit but did not start.  Since it was expensive.  He was told that it caused him around $100 a month. Underwent TAVR in February and hospitalized in February 2024 acute on chronic heart failure.  He was diuresed aggressively with improvement.  Given Solu-Medrol for possible ILD exacerbation and was placed on taper  Sent home on supplemental oxygen.  Post discharge she continues to do well  He had a rheumatology visit on 04/15/2022.  It is felt that was positive rheumatoid factor was not secondary to inflammatory arthritis.  Outpatient Encounter Medications as of 04/20/2022  Medication Sig   acetaminophen (TYLENOL) 500 MG tablet Take 1,000 mg by mouth daily as needed for moderate pain or headache.   aspirin EC 81 MG tablet Take 1 tablet (81 mg total) by mouth daily. Swallow whole.   atorvastatin (LIPITOR) 80 MG tablet TAKE 1 TABLET BY MOUTH EVERY DAY    carvedilol (COREG) 3.125 MG tablet TAKE 1 TABLET BY MOUTH TWICE A DAY WITH FOOD   CVS VITAMIN B12 1000 MCG tablet TAKE 1 TABLET BY MOUTH EVERY DAY   empagliflozin (JARDIANCE) 10 MG TABS tablet Take 1 tablet (10 mg total) by mouth daily before breakfast.   furosemide (LASIX) 40 MG tablet Take 2 tablets (80 mg total) by mouth 2 (two) times daily.   KLOR-CON M20 20 MEQ tablet TAKE 1 TABLET BY MOUTH EVERY DAY   Misc Natural Products (LIVER PROTECT PO) Take 1 capsule by mouth 2 (two) times daily. From [employer]   predniSONE (DELTASONE) 20 MG tablet Take 2 tablets (40 mg total) by mouth daily with breakfast for 7 days, THEN 1.5 tablets (30 mg total) daily with breakfast for 7 days, THEN 1 tablet (20 mg total) daily with breakfast for 7 days, THEN 0.5 tablets (10 mg total) daily with breakfast.   tamsulosin (FLOMAX) 0.4 MG CAPS capsule TAKE ONE CAPSULE BY MOUTH EVERY DAY AFTER SUPPER   amoxicillin (AMOXIL) 500 MG tablet Take 4 tablets (2,000 mg total) by mouth as directed. 1 HOUR PRIOR TO DENTAL APPOINTMENTS (Patient not taking: Reported on 04/20/2022)   No facility-administered encounter medications on file as of 04/20/2022.   Physical Exam: Blood pressure (!) 140/62, pulse 70, temperature 97.8 F (36.6 C), temperature source Oral, height 5\' 6"  (1.676 m), weight 212 lb 3.2 oz (96.3 kg), SpO2 93 %. Gen:      No acute distress HEENT:  EOMI, sclera anicteric Neck:     No masses; no thyromegaly Lungs:    Clear to auscultation  bilaterally; normal respiratory effort CV:         Regular rate and rhythm; no murmurs Abd:      + bowel sounds; soft, non-tender; no palpable masses, no distension Ext:    No edema; adequate peripheral perfusion Skin:      Warm and dry; no rash Neuro: alert and oriented x 3 Psych: normal mood and affect   Data Reviewed: Imaging: Cardiac CT 08/15/2021 Cirrhosis with splenomegaly, aortic atherosclerosis, peripheral areas of groundglass attenuation, septal thickening with  basilar predominance.   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  CT chest 02/24/2022-unchanged pulmonary fibrosis and probable UIP pattern I have reviewed the images personally.  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   He is finishing up prednisone taper.  Has not started Esbriet yet due to cost I will check with pharmacy to see what his options are We will need close monitoring of LFTs given history of cirrhosis.  Plan/Recommendations: Follow-up with pharmacy Return to clinic in 3 months  Marshell Garfinkel MD [facility] 04/20/2022, 3:27 PM  CC: Isaac Bliss, [place]*

## 2022-04-20 NOTE — Patient Instructions (Addendum)
I will check with the pharmacy team about status of Esbriet medication. Continue supplemental oxygen Follow-up in 3 months.

## 2022-04-21 ENCOUNTER — Telehealth: Payer: Self-pay | Admitting: Cardiology

## 2022-04-21 ENCOUNTER — Other Ambulatory Visit: Payer: Self-pay | Admitting: Hematology and Oncology

## 2022-04-21 ENCOUNTER — Inpatient Hospital Stay: Payer: 59 | Attending: Hematology and Oncology | Admitting: Hematology and Oncology

## 2022-04-21 ENCOUNTER — Telehealth: Payer: Self-pay | Admitting: *Deleted

## 2022-04-21 ENCOUNTER — Other Ambulatory Visit: Payer: Self-pay

## 2022-04-21 ENCOUNTER — Inpatient Hospital Stay: Payer: 59

## 2022-04-21 VITALS — BP 120/57 | HR 65 | Temp 99.1°F | Resp 24 | Wt 212.5 lb

## 2022-04-21 DIAGNOSIS — R768 Other specified abnormal immunological findings in serum: Secondary | ICD-10-CM

## 2022-04-21 DIAGNOSIS — I130 Hypertensive heart and chronic kidney disease with heart failure and stage 1 through stage 4 chronic kidney disease, or unspecified chronic kidney disease: Secondary | ICD-10-CM | POA: Diagnosis not present

## 2022-04-21 DIAGNOSIS — D696 Thrombocytopenia, unspecified: Secondary | ICD-10-CM

## 2022-04-21 DIAGNOSIS — N189 Chronic kidney disease, unspecified: Secondary | ICD-10-CM | POA: Diagnosis not present

## 2022-04-21 DIAGNOSIS — D6959 Other secondary thrombocytopenia: Secondary | ICD-10-CM | POA: Insufficient documentation

## 2022-04-21 DIAGNOSIS — R0902 Hypoxemia: Secondary | ICD-10-CM | POA: Insufficient documentation

## 2022-04-21 DIAGNOSIS — I509 Heart failure, unspecified: Secondary | ICD-10-CM | POA: Diagnosis not present

## 2022-04-21 DIAGNOSIS — K7460 Unspecified cirrhosis of liver: Secondary | ICD-10-CM | POA: Insufficient documentation

## 2022-04-21 LAB — CMP (CANCER CENTER ONLY)
ALT: 25 U/L (ref 0–44)
AST: 32 U/L (ref 15–41)
Albumin: 3.2 g/dL — ABNORMAL LOW (ref 3.5–5.0)
Alkaline Phosphatase: 62 U/L (ref 38–126)
Anion gap: 6 (ref 5–15)
BUN: 15 mg/dL (ref 8–23)
CO2: 27 mmol/L (ref 22–32)
Calcium: 9 mg/dL (ref 8.9–10.3)
Chloride: 108 mmol/L (ref 98–111)
Creatinine: 0.8 mg/dL (ref 0.61–1.24)
GFR, Estimated: >60 mL/min (ref >60)
Glucose, Bld: 132 mg/dL — ABNORMAL HIGH (ref 70–99)
Potassium: 3.9 mmol/L (ref 3.5–5.1)
Sodium: 141 mmol/L (ref 135–145)
Total Bilirubin: 3.6 mg/dL (ref 0.3–1.2)
Total Protein: 6.3 g/dL — ABNORMAL LOW (ref 6.5–8.1)

## 2022-04-21 LAB — CBC WITH DIFFERENTIAL (CANCER CENTER ONLY)
Abs Immature Granulocytes: 0.02 10*3/uL (ref 0.00–0.07)
Basophils Absolute: 0 10*3/uL (ref 0.0–0.1)
Basophils Relative: 1 %
Eosinophils Absolute: 0.1 10*3/uL (ref 0.0–0.5)
Eosinophils Relative: 3 %
HCT: 36 % — ABNORMAL LOW (ref 39.0–52.0)
Hemoglobin: 11.9 g/dL — ABNORMAL LOW (ref 13.0–17.0)
Immature Granulocytes: 0 %
Lymphocytes Relative: 18 %
Lymphs Abs: 0.9 10*3/uL (ref 0.7–4.0)
MCH: 31.7 pg (ref 26.0–34.0)
MCHC: 33.1 g/dL (ref 30.0–36.0)
MCV: 96 fL (ref 80.0–100.0)
Monocytes Absolute: 0.4 10*3/uL (ref 0.1–1.0)
Monocytes Relative: 7 %
Neutro Abs: 3.6 10*3/uL (ref 1.7–7.7)
Neutrophils Relative %: 71 %
Platelet Count: 50 10*3/uL — ABNORMAL LOW (ref 150–400)
RBC: 3.75 MIL/uL — ABNORMAL LOW (ref 4.22–5.81)
RDW: 18.8 % — ABNORMAL HIGH (ref 11.5–15.5)
WBC Count: 5.1 10*3/uL (ref 4.0–10.5)
nRBC: 0 % (ref 0.0–0.2)

## 2022-04-21 LAB — LACTATE DEHYDROGENASE: LDH: 617 U/L — ABNORMAL HIGH (ref 98–192)

## 2022-04-21 NOTE — Progress Notes (Signed)
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 Lorenso Courier  8) 07/14/2020: WBC 4.7, Hgb 12.2, Plt 70, MCV 94.6 9) 10/16/2020: WBC 4.4, Hgb 12.8, Plt 82, MCV 91.4  Interval History:  [name] 72 y.o. male with medical history significant for thrombocytopenia of unclear etiolgoy who presents for a follow up visit. The patient's last visit was on 10/16/2020. In the interim since the last visit he has been found to have an elevated serum free light chain ratio and was referred to our clinic for further evaluation.  On exam today [name] reports he feels quite well despite his low oxygen saturation.  He notes that he normally does not wear oxygen during the day and typically feels quite well.  He is not having any trouble with shortness of breath or cough.  He does have his portable oxygen at home and typically uses it in the evenings and always at night.  He reports that he has upcoming visits with his cardiologist on Friday and had a visit with pulmonology earlier this week.  He reports that he has easy bruising but no overt signs of bleeding.  He notes overall his energy levels have been good and he is continue to work without difficulty.  Overall he is at his baseline level of health.  He otherwise denies any fevers, chills, sweats, nausea, vomiting or  diarrhea.  A full 10 point ROS is listed below.  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 Burt Knack and Dr. Lavonna Monarch   Sleep apnea    has a cpap - not using religiously    SURGICAL HISTORY: Past Surgical History:  Procedure Laterality Date   BIOPSY  11/18/2020   Procedure: BIOPSY;  Surgeon: Yetta Flock, MD;  Location: [facility];  Service: Gastroenterology;;   CARDIOVERSION N/A 12/14/2017   Procedure: CARDIOVERSION;  Surgeon: [name], MD;  Location: [facility];  Service: Cardiovascular;  Laterality: N/A;   CARDIOVERSION N/A 10/01/2019   Procedure: CARDIOVERSION;  Surgeon: Buford Dresser, MD;  Location: [facility];  Service: Cardiovascular;  Laterality: N/A;   COLONOSCOPY     COLONOSCOPY WITH PROPOFOL N/A 11/18/2020   Procedure: COLONOSCOPY WITH PROPOFOL;  Surgeon:  Armbruster, Carlota Raspberry, MD;  Location: [facility];  Service: Gastroenterology;  Laterality: N/A;   CORONARY ARTERY BYPASS GRAFT  2006   ESOPHAGOGASTRODUODENOSCOPY (EGD) WITH PROPOFOL N/A 11/18/2020   Procedure: ESOPHAGOGASTRODUODENOSCOPY (EGD) WITH PROPOFOL;  Surgeon: Yetta Flock, MD;  Location: [facility];  Service: Gastroenterology;  Laterality: N/A;   INTRAOPERATIVE TRANSTHORACIC ECHOCARDIOGRAM N/A 03/16/2022   Procedure: INTRAOPERATIVE TRANSTHORACIC  ECHOCARDIOGRAM;  Surgeon: [name], MD;  Location: [place];  Service: Open Heart Surgery;  Laterality: N/A;   LEFT ATRIAL APPENDAGE OCCLUSION N/A 12/17/2021   Procedure: LEFT ATRIAL APPENDAGE OCCLUSION;  Surgeon: [name], MD;  Location: [place] [facility];  Service: Cardiovascular;  Laterality: N/A;   POLYPECTOMY     POLYPECTOMY  11/18/2020   Procedure: POLYPECTOMY;  Surgeon: Yetta Flock, MD;  Location: [facility];  Service: Gastroenterology;;   RIGHT/LEFT HEART CATH AND CORONARY/GRAFT ANGIOGRAPHY N/A 02/17/2022   Procedure: RIGHT/LEFT HEART CATH AND CORONARY/GRAFT ANGIOGRAPHY;  Surgeon: [name], MD;  Location: [place] [facility];  Service: Cardiovascular;  Laterality: N/A;   TEE WITHOUT CARDIOVERSION N/A 12/17/2021   Procedure: TRANSESOPHAGEAL ECHOCARDIOGRAM (TEE);  Surgeon: [name], MD;  Location: [place] [facility];  Service: Cardiovascular;  Laterality: N/A;   TRANSCATHETER AORTIC VALVE REPLACEMENT, TRANSFEMORAL Right 03/16/2022   Procedure: Transcatheter Aortic Valve Replacement, Transfemoral;  Surgeon: [name], MD;  Location: [place] [facility];  Service: Open Heart Surgery;  Laterality: Right;    SOCIAL HISTORY: Social History   Socioeconomic History   Marital status: Significant Other    Spouse name: Not on file   Number of children: 0   Years of education: Not on file   Highest education level: Not on file  Occupational History   Occupation: Passenger transport manager  Tobacco Use   Smoking status: Never    Passive exposure: Never   Smokeless tobacco: Never  Vaping Use   Vaping Use: Never used  Substance and Sexual Activity   Alcohol use: Not Currently    Comment: occasional   Drug use: No   Sexual activity: Not on file  Other Topics Concern   Not on file  Social History Narrative   Not on file   Social Determinants of Health   Financial Resource Strain: Not on file  Food Insecurity: No Food  Insecurity (12/17/2021)   Hunger Vital Sign    Worried About Running Out of Food in the Last Year: Never true    Ran Out of Food in the Last Year: Never true  Transportation Needs: No Transportation Needs (12/17/2021)   PRAPARE - Hydrologist (Medical): No    Lack of Transportation (Non-Medical): No  Physical Activity: Not on file  Stress: Not on file  Social Connections: Not on file  Intimate Partner Violence: Not At Risk (12/17/2021)   Humiliation, Afraid, Rape, and Kick questionnaire    Fear of Current or Ex-Partner: No    Emotionally Abused: No    Physically Abused: No    Sexually Abused: No    FAMILY HISTORY: Family History  Problem Relation Age of Onset   Hypertension Mother    Hyperlipidemia Mother    Heart disease Father    Ulcers Father    Bipolar disorder Brother    Colon cancer Neg Hx    Colon polyps Neg Hx    Esophageal cancer Neg Hx    Rectal cancer Neg Hx    Stomach cancer Neg Hx     ALLERGIES:  has No Known Allergies.  MEDICATIONS:  Current Outpatient Medications  Medication Sig Dispense Refill   acetaminophen (TYLENOL) 500 MG tablet Take 1,000 mg by mouth daily as needed for moderate pain or headache.     aspirin EC 81 MG tablet Take 1 tablet (81 mg total) by mouth daily. Swallow whole. 30 tablet 12   atorvastatin (LIPITOR) 80 MG tablet TAKE 1 TABLET BY MOUTH EVERY DAY 90 tablet 1   carvedilol (COREG) 3.125 MG tablet TAKE 1 TABLET BY MOUTH TWICE A DAY WITH FOOD 60 tablet 5   [employer] VITAMIN B12 1000 MCG tablet TAKE 1 TABLET BY MOUTH EVERY DAY 180 tablet 1   empagliflozin (JARDIANCE) 10 MG TABS tablet Take 1 tablet (10 mg total) by mouth daily before breakfast. 30 tablet 11   furosemide (LASIX) 40 MG tablet Take 2 tablets (80 mg total) by mouth 2 (two) times daily. 360 tablet 3   KLOR-CON M20 20 MEQ tablet TAKE 1 TABLET BY MOUTH EVERY DAY 30 tablet 11   Misc Natural Products (LIVER PROTECT PO) Take 1 capsule by mouth 2 (two) times  daily. From [employer]     predniSONE (DELTASONE) 20 MG tablet Take 2 tablets (40 mg total) by mouth daily with breakfast for 7 days, THEN 1.5 tablets (30 mg total) daily with breakfast for 7 days, THEN 1 tablet (20 mg total) daily with breakfast for 7 days, THEN 0.5 tablets (10 mg total) daily with breakfast. 47 tablet 0   tamsulosin (FLOMAX) 0.4 MG CAPS capsule TAKE ONE CAPSULE BY MOUTH EVERY DAY AFTER SUPPER 90 capsule 1   amoxicillin (AMOXIL) 500 MG tablet Take 4 tablets (2,000 mg total) by mouth as directed. 1 HOUR PRIOR TO DENTAL APPOINTMENTS 12 tablet 6   No current facility-administered medications for this visit.    REVIEW OF SYSTEMS:   Constitutional: ( - ) fevers, ( - )  chills , ( - ) night sweats Eyes: ( - ) blurriness of vision, ( - ) double vision, ( - ) watery eyes Ears, nose, mouth, throat, and face: ( - ) mucositis, ( - ) sore throat Respiratory: ( - ) cough, ( - ) dyspnea, ( - ) wheezes Cardiovascular: ( - ) palpitation, ( - ) chest discomfort, ( - ) lower extremity swelling Gastrointestinal:  ( - ) nausea, ( - ) heartburn, ( - ) change in bowel habits Skin: ( - ) abnormal skin rashes Lymphatics: ( - ) new lymphadenopathy, ( - ) easy bruising Neurological: ( - ) numbness, ( - ) tingling, ( - ) new weaknesses Behavioral/Psych: ( - ) mood change, ( - ) new changes  All other systems were reviewed with the patient and are negative.  PHYSICAL EXAMINATION: Vitals:   04/21/22 1004 04/21/22 1015  BP: (!) 120/57   Pulse: 66 65  Resp: (!) 24   Temp: 99.1 F (37.3 C)   SpO2: (!) 83% (!) 88%    Filed Weights   04/21/22 1004  Weight: 212 lb 8 oz (96.4 kg)     GENERAL: well appearing elderly Caucasian male. Alert, no distress and comfortable SKIN: skin color, texture, turgor are normal, no rashes or significant lesions EYES: conjunctiva are pink and non-injected, sclera clear LUNGS: clear to auscultation and percussion with normal breathing effort HEART: regular  rate & rhythm and no murmurs and +2 lower extremity edema Musculoskeletal: no cyanosis of digits and no clubbing  PSYCH: alert & oriented x 3, fluent speech NEURO: no focal motor/sensory  deficits  LABORATORY DATA:  I have reviewed the data as listed    Latest Ref Rng & Units 04/21/2022   11:00 AM 03/21/2022    1:09 AM 03/20/2022    1:13 AM  CBC  WBC 4.0 - 10.5 K/uL 5.1  5.4  2.9   Hemoglobin 13.0 - 17.0 g/dL 11.9  12.3  11.2   Hematocrit 39.0 - 52.0 % 36.0  36.2  34.8   Platelets 150 - 400 K/uL 50  41  40        Latest Ref Rng & Units 03/29/2022    9:48 AM 03/21/2022    1:09 AM 03/20/2022    1:13 AM  CMP  Glucose 70 - 99 mg/dL 127  166  159   BUN 8 - 27 mg/dL 22  22  15    Creatinine 0.76 - 1.27 mg/dL 1.06  0.82  0.82   Sodium 134 - 144 mmol/L 143  134  135   Potassium 3.5 - 5.2 mmol/L 4.1  4.4  3.9   Chloride 96 - 106 mmol/L 103  105  103   CO2 20 - 29 mmol/L 25  25  24    Calcium 8.6 - 10.2 mg/dL 8.7  8.8  8.5     Lab Results  Component Value Date   MPROTEIN Not Observed 03/16/2022   Lab Results  Component Value Date   KPAFRELGTCHN 59.3 (H) 03/16/2022   KPAFRELGTCHN 61.0 (H) 02/11/2021   LAMBDASER 30.3 (H) 03/16/2022   LAMBDASER 43.9 (H) 02/11/2021   KAPLAMBRATIO 1.96 (H) 03/16/2022   KAPLAMBRATIO 1.39 02/11/2021     RADIOGRAPHIC STUDIES: No results found.  ASSESSMENT & PLAN Key Brule Brunke 72 y.o. male with medical history significant for thrombocytopenia 2/2 to cirrhosis who presents for a follow up visit.    After review the labs, the records, schedule the patient the findings are most consistent with thrombocytopenia secondary to cirrhosis of the liver.  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  The patient has been reconnected with his GI doctor Dr. Havery Moros who is currently working on his cirrhosis.  At this time given the clear etiology of the thrombocytopenia I do not believe there is any need for routine  follow-up in the clinic.  We are happy to be available in the event that he would develop any new or worsening issues regarding his blood counts.  #Elevated Serum Free Light Chain Ratio --today will order an SPEP, UPEP, SFLC and beta 2 microglobulin --additionally will collect new baseline CBC, CMP, and LDH --No monoclonal protein noted during prior workup.  Low suspicion this represents AL amyloidosis or a monoclonal gammopathy. --will consider the need for a bone marrow/ fat pad biopsy pending the above results --RTC pending results of the above studies  #Hypoxia -- Patient noted to desat down to 83% in our office today.  88% on recheck. -- Patient has home oxygen which she uses in the evening and at night.  Encouraged him to use it throughout the day due to his low levels. -- Notified patient's cardiology office who he will be meeting with on Friday.  He met with pulmonology yesterday.  #Thrombocytopenia, chronic #Mild Vitamin B12 deficieincy --Findings are consistent with thrombocytopenia secondary to cirrhosis --Recommend holding Eliquis therapy if his platelets were to drop below 50 --Please call our clinic in the event the patient were to develop any bleeding or serious changes in blood counts. --In the event the patient had a  platelet count less than 50 and required surgical intervention or procedure please let us know as we could arrange for TPO mimetic therapy in order to help boost his counts. --Labs today show white blood cell count 5.1, hemoglobin 1.9, MCV 96, and platelets of 50.  Recommend caution with anticoagulation with platelets of less than 50. --There is no need for routine follow-up in our clinic, though we would happily see him back in the event that there is anything we can contribute to his care.  No orders of the defined types were placed in this encounter.    All questions were answered. The patient knows to call the clinic with any problems, questions or  concerns.  A total of more than 30 minutes were spent on this encounter with face-to-face time and non-face-to-face time, including preparing to see the patient, ordering tests and/or medications, counseling the patient and coordination of care as outlined above.   Ledell Peoples, MD Department of Hematology/Oncology [place] at [facility] Phone: [phone] Pager: [phone] Email: [email or web address]  04/21/2022 11:32 AM

## 2022-04-21 NOTE — Telephone Encounter (Signed)
Per Dr Libby Maw request, PC to patient's cardiologist Dr Stanford Breed to inform him of patient's low O2 sat readings this a.m. at MD appointment.  Spoke with Coralyn Mark, triage RN - informed patient's O2 sat was 83% & then 88% here this a.m.  Patient denied any changes & says he wears O2 in the evenings & at night but not during the day.  He was SOB with walking.  He has appointment with Dr Stanford Breed on 04/23/22.  RN verbalized understanding & will inform Dr Stanford Breed.

## 2022-04-21 NOTE — Telephone Encounter (Signed)
CRITICAL VALUE STICKER  CRITICAL VALUE: Bilirubin 3.6  RECEIVER (on-site recipient of call): Velna Ochs RN  DATE & TIME NOTIFIED: 04/21/22 @ 1201  MESSENGER (representative from lab): Janett Billow  MD NOTIFIED: Dr Lorenso Courier  TIME OF NOTIFICATION: 1202  RESPONSE:  MD aware

## 2022-04-21 NOTE — Telephone Encounter (Signed)
[name], from the [facility] Dr. Libby Maw office called to let us know patient's O2 stats were at 83% when patient first got to the office today, they are now 88%.  Patient uses oxygen, patient told them that is not usual for him, he states he feels fine.  [name] would like a call back.

## 2022-04-21 NOTE — Telephone Encounter (Addendum)
LVM for "[name]" to return call to triage  [name] calls back to inform patient arrival at Oncology with O2 sats at 83%,  Re-check after few minutes and was at 88%.  Patient has O2 but does not use during the day.  States he only uses it in the evening at home and at night when sleeping.. She states his R 24 and when walking to Lab he had to stop and rest.  He stated he feels normal and this is his normal oxygen level during the day, "it just hangs out there during the day" and had no increased SOB just his normal breathing for him.  They encouraged him to wear it during the day as well . Called to patient that verified information.  He states regarding O2 he is ordered for 4L pnc, but does not use during the day as "don't feel it's necessary".  No increased SOB than what he normally has, no difficulty breathing or walking when O2 is low, per patient.  I encouraged him to wear during the day as it may help his energy level especially when ambulating.  He says he will think about it.  He also ask about different types of oxygen.  I advised to ask pulmonary for  a oxygen that he can carry around rather than portable he has to roll, if this would encourage him to wear it more often.  He will discuss at his appt. Friday with Cardiology and also will speak with Respiratory, who he saw yesterday.

## 2022-04-22 LAB — BETA 2 MICROGLOBULIN, SERUM: Beta-2 Microglobulin: 2.3 mg/L (ref 0.6–2.4)

## 2022-04-22 LAB — KAPPA/LAMBDA LIGHT CHAINS
Kappa free light chain: 49.6 mg/L — ABNORMAL HIGH (ref 3.3–19.4)
Kappa, lambda light chain ratio: 1.78 — ABNORMAL HIGH (ref 0.26–1.65)
Lambda free light chains: 27.8 mg/L — ABNORMAL HIGH (ref 5.7–26.3)

## 2022-04-22 NOTE — Progress Notes (Signed)
[facility]                                     Cardiology Office Note:    Date:  04/23/2022   ID:  EZRAH KARNEY, DOB Dec 27, 1950, MRN [id number]  PCP:  Isaac Bliss, Rayford Halsted, MD  [facility] Cardiologist:  Kirk Ruths, MD / Dr. Burt Knack, MD and Dr. Lavonna Monarch, MD (TAVR)  Referring MD: Isaac Bliss, [place]*   Chief Complaint  Patient presents with   Follow-up    1 month s/p TAVR   History of Present Illness:    VIBHAV CUBIAS is a 72 y.o. 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    Mr. Roaden has a long hx of cirrhosis complicated by thrombocytopenia and is followed closely by GI. Additionally he is followed by pulmonology due to interstitial lung disease. He has undergone high-resolution CT scanning and PFTs with Dr. Vaughan Browner. He has been followed for quite some time by Dr. Stanford Breed for his cardiology care. He has been monitored with surveillance echocardiogram imaging for progressive aortic stenosis. The patient is known to the structural heart team after undergoing Watchman implant with Dr. Burt Knack 12/2021. He has done well however wished to see Dr. Burt Knack once again for further evaluation of his aortic stenosis.    At the time of consult, he had c/o progressive dyspnea and fatigue. He was not felt to be a surgical candidate due to high risk co-morbitities as above. Post LAAO CT was converted to pre TAVR scans which showed anatomy suitable to proceed. R/LHC showed multivessel coronary artery disease, continued patency of the LIMA to LAD and saphenous vein graft to first diagonal with chronic occlusion of the saphenous vein graft OM and saphenous vein graft to PDA.     The patient was then evaluated by the multidisciplinary valve team and felt to have severe, symptomatic aortic stenosis and to be a suitable candidate and is now s/p successful TAVR with a 26 mm Edwards Sapien 3 THV via the TF approach on 03/16/22. Post TAVR echo sowed an LVEF at 45-50% with mild hypokinesis. Mean gradient increased from 11>>56mmHg from immediate post TAVR echo to POD 1 with DVI from 0.57>>0.67. He was continued on Plavix monotherapy s/p LAAO closure with Watchman. His Plt count dropped below 50K and his risk of bleeding was felt to be lower on ASA 81mg  QD therefore Plavix was discontinued.    Unfortunately his hospitalization with complicated by acute CHF and acute hypoxic respiratory failure. He was treated with IV lasix with improvement in his fluid status. He was placed on HF [place] and was seen by his pulmonologist while inpatient and placed on a steroid taper.   In follow up, he was doing well and was wearing his oxygen at night. Today he is here alone and continues to do well from a CV standpoint. He is frustrated mostly due to his lung disease and feels that he would like to change pulmonologist as this may be a better fit for him. I have given him a few names for providers he may see. He was seen by Dr. Lorenso Courier with  hematology given abnormal free light chain amyloid labs. There was low concern for AL or monoclonal gammopathy however repeat labs were draw with plans to pursue marrow biopsy if indicated. Appears that labs remain pending at this time. He is scheduled for PYP scan 3/27 with our team. I spoke at length about this process with him. He denies SOB, chest pain, palpitations, orthopnea, dizziness, or syncope. His LE edema is better. He continues to take his Lasix on a regular basis and wearing his CPAP.    Past Medical History:  Diagnosis Date   Adjustment disorder with depressed mood 09/07/2007   Cataract    removed bilaterally   Chronic kidney disease    kidney stones    Cirrhosis ([place])    Colon polyps    Tubular Adenoma 2010   CONGESTIVE HEART FAILURE 12/09/2008   CORONARY ARTERY DISEASE 2006   HYPERLIPIDEMIA 08/09/2006   HYPERTENSION 08/09/2006   MYOCARDIAL INFARCTION, HX OF 07/07/2004   NEPHROLITHIASIS, HX OF 08/09/2006   OBESITY 07/19/2008   Presence of Watchman left atrial appendage closure device 12/17/2021   9mm Watchman with Dr. 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 Burt Knack and Dr. Lavonna Monarch   Sleep apnea    has a cpap - not using religiously    Past Surgical History:  Procedure Laterality Date   BIOPSY  11/18/2020   Procedure: BIOPSY;  Surgeon: Yetta Flock, MD;  Location: [facility];  Service: Gastroenterology;;   CARDIOVERSION N/A 12/14/2017   Procedure: CARDIOVERSION;  Surgeon: [name], MD;  Location: [place];  Service: Cardiovascular;  Laterality: N/A;   CARDIOVERSION N/A 10/01/2019   Procedure: CARDIOVERSION;  Surgeon: Buford Dresser, MD;  Location: [facility];  Service: Cardiovascular;  Laterality: N/A;   COLONOSCOPY     COLONOSCOPY WITH PROPOFOL N/A 11/18/2020   Procedure: COLONOSCOPY WITH PROPOFOL;  Surgeon: Yetta Flock, MD;  Location: [facility];  Service: Gastroenterology;  Laterality: N/A;   CORONARY ARTERY BYPASS GRAFT  2006   ESOPHAGOGASTRODUODENOSCOPY (EGD) WITH PROPOFOL N/A 11/18/2020   Procedure: ESOPHAGOGASTRODUODENOSCOPY (EGD) WITH PROPOFOL;  Surgeon: Yetta Flock, MD;  Location: [facility];  Service: Gastroenterology;  Laterality: N/A;   INTRAOPERATIVE TRANSTHORACIC ECHOCARDIOGRAM N/A 03/16/2022   Procedure: INTRAOPERATIVE TRANSTHORACIC ECHOCARDIOGRAM;  Surgeon: [name], MD;  Location: [place] [facility];  Service: Open Heart Surgery;  Laterality: N/A;   LEFT ATRIAL APPENDAGE OCCLUSION N/A 12/17/2021   Procedure: LEFT ATRIAL APPENDAGE OCCLUSION;  Surgeon: [name], MD;  Location: [place] [facility];  Service: Cardiovascular;  Laterality: N/A;   POLYPECTOMY     POLYPECTOMY  11/18/2020   Procedure: POLYPECTOMY;  Surgeon: Yetta Flock, MD;  Location: [facility];  Service: Gastroenterology;;   RIGHT/LEFT HEART CATH AND CORONARY/GRAFT ANGIOGRAPHY N/A 02/17/2022   Procedure: RIGHT/LEFT HEART CATH AND CORONARY/GRAFT ANGIOGRAPHY;  Surgeon: [name], MD;  Location: [place] [facility];  Service: Cardiovascular;  Laterality: N/A;   TEE WITHOUT CARDIOVERSION N/A 12/17/2021   Procedure: TRANSESOPHAGEAL ECHOCARDIOGRAM (TEE);  Surgeon: [name], MD;  Location: [place] [facility];  Service: Cardiovascular;  Laterality: N/A;   TRANSCATHETER AORTIC VALVE REPLACEMENT, TRANSFEMORAL Right 03/16/2022   Procedure: Transcatheter Aortic Valve Replacement, Transfemoral;  Surgeon: [name], MD;  Location: [place] [facility];  Service: Open Heart Surgery;  Laterality: Right;    Current Medications: Current Meds  Medication Sig   acetaminophen (TYLENOL) 500 MG tablet Take 1,000 mg by mouth daily as needed  for moderate pain or headache.   amoxicillin (AMOXIL) 500 MG tablet Take 4 tablets (2,000 mg total) by mouth as directed. 1 HOUR PRIOR TO DENTAL APPOINTMENTS   aspirin EC 81 MG tablet Take 1 tablet (81 mg total) by mouth daily. Swallow whole.   atorvastatin (LIPITOR) 80 MG tablet TAKE 1 TABLET BY MOUTH EVERY DAY   carvedilol (COREG) 3.125 MG tablet TAKE 1 TABLET BY MOUTH TWICE A DAY WITH FOOD   [employer] VITAMIN B12 1000 MCG tablet TAKE 1 TABLET BY MOUTH EVERY DAY   empagliflozin (JARDIANCE) 10 MG TABS tablet Take 1 tablet (10 mg total) by mouth daily before breakfast.   furosemide (LASIX) 40 MG tablet Take 2 tablets (80 mg total) by mouth 2 (two) times daily.   KLOR-CON M20 20 MEQ tablet TAKE 1 TABLET BY MOUTH EVERY DAY   Misc Natural Products (LIVER PROTECT PO) Take 1 capsule by mouth 2 (two) times daily. From [employer]   predniSONE (DELTASONE) 20 MG tablet Take 2  tablets (40 mg total) by mouth daily with breakfast for 7 days, THEN 1.5 tablets (30 mg total) daily with breakfast for 7 days, THEN 1 tablet (20 mg total) daily with breakfast for 7 days, THEN 0.5 tablets (10 mg total) daily with breakfast.   tamsulosin (FLOMAX) 0.4 MG CAPS capsule TAKE ONE CAPSULE BY MOUTH EVERY DAY AFTER SUPPER     Allergies:   Patient has no known allergies.   Social History   Socioeconomic History   Marital status: Significant Other    Spouse name: Not on file   Number of children: 0   Years of education: Not on file   Highest education level: Not on file  Occupational History   Occupation: Passenger transport manager  Tobacco Use   Smoking status: Never    Passive exposure: Never   Smokeless tobacco: Never  Vaping Use   Vaping Use: Never used  Substance and Sexual Activity   Alcohol use: Not Currently    Comment: occasional   Drug use: No   Sexual activity: Not on file  Other Topics Concern   Not on file  Social History Narrative   Not on file   Social Determinants of Health   Financial Resource Strain: Not on file  Food Insecurity: No Food Insecurity (12/17/2021)   Hunger Vital Sign    Worried About Running Out of Food in the Last Year: Never true    Ran Out of Food in the Last Year: Never true  Transportation Needs: No Transportation Needs (12/17/2021)   PRAPARE - Hydrologist (Medical): No    Lack of Transportation (Non-Medical): No  Physical Activity: Not on file  Stress: Not on file  Social Connections: Not on file     Family History: The patient's family history includes Bipolar disorder in his brother; Heart disease in his father; Hyperlipidemia in his mother; Hypertension in his mother; Ulcers in his father. There is no history of Colon cancer, Colon polyps, Esophageal cancer, Rectal cancer, or Stomach cancer.  ROS:   Please see the history of present illness.    All other systems reviewed and are  negative.  EKGs/Labs/Other Studies Reviewed:    The following studies were reviewed today:   TAVR OPERATIVE NOTE     Date of Procedure:                03/16/2022   Preoperative Diagnosis:      Severe Aortic Stenosis  Postoperative Diagnosis:    Same    Procedure:        Transcatheter Aortic Valve Replacement - Percutaneous  Transfemoral Approach             Edwards Sapien 3 Ultra Resilia THV (size 26 mm, serial # [id number])              Co-Surgeons:                        Coralie Common, MD and [name], MD   Anesthesiologist:                  Suann Larry, MD   Echocardiographer:              Jenkins Rouge, MD   Pre-operative Echo Findings: Severe aortic stenosis Mild left ventricular systolic dysfunction   Post-operative Echo Findings: No paravalvular leak unchanged left ventricular systolic function  _____________   Echocardiogram 03/17/22:  1. Left ventricular ejection fraction, by estimation, is 45 to 50%. The  left ventricle has mildly decreased function. The left ventricle  demonstrates global hypokinesis. Left ventricular diastolic function could  not be evaluated. Elevated left  ventricular end-diastolic pressure.   2. Right ventricular systolic function is normal. The right ventricular  size is normal. There is normal pulmonary artery systolic pressure. The  estimated right ventricular systolic pressure is 123456 mmHg.   3. Left atrial size was moderately dilated.   4. The mitral valve is degenerative. Trivial mitral valve regurgitation.  No evidence of mitral stenosis. Moderate mitral annular calcification.   5. The aortic valve has been repaired/replaced. Aortic valve  regurgitation is not visualized. No aortic stenosis is present. There is a  26 mm Edwards Ultra, stented (TAVR) valve present in the aortic position.  Procedure Date: 03/16/22. Aortic valve area,   by VTI measures 2.12 cm. Aortic valve mean gradient measures 15.5 mmHg.  Aortic valve  Vmax measures 2.60 m/s.   6. The inferior vena cava is dilated in size with <50% respiratory  variability, suggesting right atrial pressure of 15 mmHg.   7. Compared to immdediate post TAVR study 03/16/22, the mean TAVR gradient  has increased from 73mmHg to 81mHg, Vmax has increased from 2.2 to 2.63m/s  and DVI has increased from 0.57 to 0.67.    Limited Echocardiogram 03/16/22 IMPRESSIONS  1. Septal apical and inferior wall hypokinesis . Left ventricular  ejection fraction, by estimation, is 45 to 50%. The left ventricle has  mildly decreased function. The left ventricle demonstrates regional wall  motion abnormalities (see scoring  diagram/findings for description). The left ventricular internal cavity  size was mildly dilated.   2. The mitral valve is abnormal. Trivial mitral valve regurgitation.  Moderate mitral annular calcification.   3. Pre TAVR tri leaflet AV with severe AS mean gradient 27 peak 46 mmHg  AVA 1.6 cm2 supine in cath lab       Post TAVR: well place 26 mm Sapien 3 valve with no significant PVL  mean gradient 11 peak 20 mmHg AVI 3.1 cm2. The aortic valve has been  repaired/replaced. There is a 26 mm Sapien prosthetic (TAVR) valve present  in the aortic position. Procedure Date:   03/16/2022.   EKG:  EKG is not ordered today.    Recent Labs: 05/20/2021: B Natriuretic Peptide 1,190.0 03/18/2022: Magnesium 2.0 04/21/2022: ALT 25; BUN 15; Creatinine 0.80; Hemoglobin 11.9; Platelet Count 50; Potassium 3.9; Sodium 141  Recent Lipid Panel  Component Value Date/Time   CHOL 118 02/12/2021 1438   CHOL 111 07/23/2019 1144   TRIG 66.0 02/12/2021 1438   HDL 40.30 02/12/2021 1438   HDL 48 07/23/2019 1144   CHOLHDL 3 02/12/2021 1438   VLDL 13.2 02/12/2021 1438   LDLCALC 65 02/12/2021 1438   LDLCALC 53 11/30/2019 0929   LDLDIRECT 198.2 07/11/2009 0925   Physical Exam:    VS:  BP (!) 120/50   Pulse 69   Ht 5\' 6"  (1.676 m)   Wt 215 lb 9.6 oz (97.8 kg)   SpO2 (!) 85%    BMI 34.80 kg/m     Wt Readings from Last 3 Encounters:  04/23/22 215 lb 9.6 oz (97.8 kg)  04/21/22 212 lb 8 oz (96.4 kg)  04/20/22 212 lb 3.2 oz (96.3 kg)    General: Well developed, well nourished, NAD Lungs:Clear to ausculation bilaterally. No wheezes, rales, or rhonchi. Breathing is unlabored. Cardiovascular: RRR with S1 S2. + murmur Extremities: BLE edema.  Neuro: Alert and oriented. No focal deficits. No facial asymmetry. MAE spontaneously. Psych: Responds to questions appropriately with normal affect.    ASSESSMENT/PLAN:    Severe AS: Patient doing well with NYHA class II symptoms mainly in the setting of his chronic lung disease s/p successful TAVR with a 26 mm Edwards Sapien 3 THV via the TF approach on 03/16/22. Post TAVR echo with LVEF at 45-50% with mild hypokinesis. Mean gradient increased from 11>>43mmHg from immediate post TAVR echo to POD 1 with DVI from 0.57>>0.67. He was initially continued on Plavix monotherapy s/p LAAO closure however this was transitioned to ASA 81mg  QD post TAVR. Echo performed today however formal read is pending. Valve appears to be stable with no PVL. Will review with MD as his MR appears to be worse. Will call him with final results. Continue lifelong dental SBE with Amoxicillin. Plan follow up with Dr. Stanford Breed today and our team in one year with echo. I have made this today.    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 Volume appears stable today with current regimen. Weight has been stable and breathing is at baseline.    Paroxysmal atrial fibrillation: s/p LAAO with 27mm Watchman device on 12/17/21. He is no longer on Eliquis. He was to continue Plavix monotherapy through 6 months (06/17/22) post implant however this was transitioned to ASA 81mg  QD post TAVR. He will continue this indefinitely due to CAD.      Pulmonary fibrosis with hypoxia: Trouble with post-op hypoxia post procedure. He was evaluated by pulmonology and placed on steroid taper. He is now wearing O2 while at home only. High resolution CT pending. He wished to change pulmonary providers. I have encouraged that he contact their office to initiate this process.     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     Elevated RAISE score: Kappa/lambda light chain panel abnormal with elevated kappa and lambda chains with elevated ratio at 1.96. Urine immunofixation NEGATIVE. Myeloma panel with evidence of polyclonal increase detected in one or more immunoglobulin. 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 Patient saw Dr. Lorenso Courier 3/20. There was low suspicion for AL however repeat labs drawn. He is scheduled for  PYP 3/27. Will continue to follow along and make appropriate referrals.    ? MR: Awaiting final echo report however MR appears more prominent on my review. Post TAVR echo with trivial MR. Will send note to Dr. Stanford Breed and Dr. Burt Knack for review.     Medication Adjustments/Labs and Tests Ordered: Current medicines are reviewed at length with the patient today.  Concerns regarding medicines are outlined above.  No orders of the defined types were placed in this encounter.  No orders of the defined types were placed in this encounter.   Patient Instructions  Medication Instructions:  Your physician recommends that you continue on your current medications as directed. Please refer to the Current Medication list given to you today.  *If you need a refill on your cardiac medications before your next appointment, please call your pharmacy*   Lab Work: NONE If you have labs (blood work) drawn today and your tests are  completely normal, you will receive your results only by: [place] (if you have MyChart) OR A paper copy in the mail If you have any lab test that is abnormal or we need to change your treatment, we will call you to review the results.   Testing/Procedures: NONE   Follow-Up: At [facility], you and your health needs are our priority.  As part of our continuing mission to provide you with exceptional heart care, we have created designated Provider Care Teams.  These Care Teams include your primary Cardiologist (physician) and Advanced Practice Providers (APPs -  Physician Assistants and Nurse Practitioners) who all work together to provide you with the care you need, when you need it.  We recommend signing up for the patient portal called "MyChart".  Sign up information is provided on this After Visit Summary.  MyChart is used to connect with patients for Virtual Visits (Telemedicine).  Patients are able to view lab/test results, encounter notes, upcoming appointments, etc.  Non-urgent messages can be sent to your provider as well.   To learn more about what you can do with MyChart, go to [email or web address].    Your next appointment:   KEEP SCHEDULED FOLLOW-UP   Signed, Kathyrn Drown, NP  04/23/2022 1:40 PM    [facility]

## 2022-04-23 ENCOUNTER — Ambulatory Visit: Payer: 59 | Admitting: Cardiology

## 2022-04-23 ENCOUNTER — Encounter: Payer: Self-pay | Admitting: Cardiology

## 2022-04-23 ENCOUNTER — Ambulatory Visit: Payer: 59 | Attending: Cardiovascular Disease

## 2022-04-23 VITALS — BP 120/50 | HR 69 | Ht 66.0 in | Wt 215.6 lb

## 2022-04-23 VITALS — BP 128/58 | HR 73 | Ht 66.0 in | Wt 220.2 lb

## 2022-04-23 DIAGNOSIS — I340 Nonrheumatic mitral (valve) insufficiency: Secondary | ICD-10-CM

## 2022-04-23 DIAGNOSIS — I43 Cardiomyopathy in diseases classified elsewhere: Secondary | ICD-10-CM | POA: Insufficient documentation

## 2022-04-23 DIAGNOSIS — I5033 Acute on chronic diastolic (congestive) heart failure: Secondary | ICD-10-CM | POA: Insufficient documentation

## 2022-04-23 DIAGNOSIS — Z95818 Presence of other cardiac implants and grafts: Secondary | ICD-10-CM

## 2022-04-23 DIAGNOSIS — Z952 Presence of prosthetic heart valve: Secondary | ICD-10-CM

## 2022-04-23 DIAGNOSIS — I350 Nonrheumatic aortic (valve) stenosis: Secondary | ICD-10-CM

## 2022-04-23 DIAGNOSIS — R0609 Other forms of dyspnea: Secondary | ICD-10-CM

## 2022-04-23 DIAGNOSIS — I10 Essential (primary) hypertension: Secondary | ICD-10-CM

## 2022-04-23 DIAGNOSIS — J9611 Chronic respiratory failure with hypoxia: Secondary | ICD-10-CM | POA: Diagnosis present

## 2022-04-23 DIAGNOSIS — E854 Organ-limited amyloidosis: Secondary | ICD-10-CM | POA: Diagnosis not present

## 2022-04-23 DIAGNOSIS — J849 Interstitial pulmonary disease, unspecified: Secondary | ICD-10-CM | POA: Diagnosis present

## 2022-04-23 DIAGNOSIS — I5042 Chronic combined systolic (congestive) and diastolic (congestive) heart failure: Secondary | ICD-10-CM | POA: Insufficient documentation

## 2022-04-23 LAB — ECHOCARDIOGRAM COMPLETE
AR max vel: 1.7 cm2
AV Area VTI: 1.61 cm2
AV Area mean vel: 1.65 cm2
AV Mean grad: 18.5 mmHg
AV Peak grad: 35.3 mmHg
Ao pk vel: 2.97 m/s
Area-P 1/2: 2.6 cm2
S' Lateral: 5 cm

## 2022-04-23 MED ORDER — TORSEMIDE 20 MG PO TABS: 60.0000 mg | ORAL_TABLET | Freq: Two times a day (BID) | ORAL | 3 refills | Status: DC

## 2022-04-23 NOTE — Patient Instructions (Signed)
Medication Instructions:   STOP FUROSEMIDE   START TORSEMIDE 60 MG TWICE DAILY= 3 OF THE 20 MG TABLETS TWICE DAILY  *If you need a refill on your cardiac medications before your next appointment, please call your pharmacy*   Lab Work:  Your physician recommends that you return for lab work in:ONE WEEK-DO NOT NEED TO FAST  If you have labs (blood work) drawn today and your tests are completely normal, you will receive your results only by: [place] (if you have MyChart) OR A paper copy in the mail If you have any lab test that is abnormal or we need to change your treatment, we will call you to review the results.   Follow-Up: At [facility], you and your health needs are our priority.  As part of our continuing mission to provide you with exceptional heart care, we have created designated Provider Care Teams.  These Care Teams include your primary Cardiologist (physician) and Advanced Practice Providers (APPs -  Physician Assistants and Nurse Practitioners) who all work together to provide you with the care you need, when you need it.  We recommend signing up for the patient portal called "MyChart".  Sign up information is provided on this After Visit Summary.  MyChart is used to connect with patients for Virtual Visits (Telemedicine).  Patients are able to view lab/test results, encounter notes, upcoming appointments, etc.  Non-urgent messages can be sent to your provider as well.   To learn more about what you can do with MyChart, go to [email or web address].    Your next appointment:   1 week(s)  Provider:   Sande Rives PA

## 2022-04-23 NOTE — Telephone Encounter (Signed)
Discussed at office visit 04/23/22

## 2022-04-23 NOTE — Patient Instructions (Signed)
Medication Instructions:  Your physician recommends that you continue on your current medications as directed. Please refer to the Current Medication list given to you today.  *If you need a refill on your cardiac medications before your next appointment, please call your pharmacy*   Lab Work: NONE If you have labs (blood work) drawn today and your tests are completely normal, you will receive your results only by: MyChart Message (if you have MyChart) OR A paper copy in the mail If you have any lab test that is abnormal or we need to change your treatment, we will call you to review the results.   Testing/Procedures: NONE   Follow-Up: At [facility], you and your health needs are our priority.  As part of our continuing mission to provide you with exceptional heart care, we have created designated Provider Care Teams.  These Care Teams include your primary Cardiologist (physician) and Advanced Practice Providers (APPs -  Physician Assistants and Nurse Practitioners) who all work together to provide you with the care you need, when you need it.  We recommend signing up for the patient portal called "MyChart".  Sign up information is provided on this After Visit Summary.  MyChart is used to connect with patients for Virtual Visits (Telemedicine).  Patients are able to view lab/test results, encounter notes, upcoming appointments, etc.  Non-urgent messages can be sent to your provider as well.   To learn more about what you can do with MyChart, go to [email or web address].    Your next appointment:   KEEP SCHEDULED FOLLOW-UP 

## 2022-04-24 ENCOUNTER — Other Ambulatory Visit: Payer: Self-pay | Admitting: Internal Medicine

## 2022-04-26 DIAGNOSIS — D6959 Other secondary thrombocytopenia: Secondary | ICD-10-CM | POA: Diagnosis not present

## 2022-04-27 LAB — MULTIPLE MYELOMA PANEL, SERUM
Albumin SerPl Elph-Mcnc: 3.1 g/dL (ref 2.9–4.4)
Albumin/Glob SerPl: 1.1 (ref 0.7–1.7)
Alpha 1: 0.2 g/dL (ref 0.0–0.4)
Alpha2 Glob SerPl Elph-Mcnc: 0.4 g/dL (ref 0.4–1.0)
B-Globulin SerPl Elph-Mcnc: 0.9 g/dL (ref 0.7–1.3)
Gamma Glob SerPl Elph-Mcnc: 1.4 g/dL (ref 0.4–1.8)
Globulin, Total: 3 g/dL (ref 2.2–3.9)
IgA: 476 mg/dL — ABNORMAL HIGH (ref 61–437)
IgG (Immunoglobin G), Serum: 1414 mg/dL (ref 603–1613)
IgM (Immunoglobulin M), Srm: 127 mg/dL (ref 15–143)
Total Protein ELP: 6.1 g/dL (ref 6.0–8.5)

## 2022-04-28 ENCOUNTER — Telehealth: Payer: Self-pay | Admitting: Pulmonary Disease

## 2022-04-28 ENCOUNTER — Ambulatory Visit (HOSPITAL_COMMUNITY): Payer: 59

## 2022-04-28 LAB — UPEP/UIFE/LIGHT CHAINS/TP, 24-HR UR
% BETA, Urine: 0 %
ALPHA 1 URINE: 0 %
Albumin, U: 100 %
Alpha 2, Urine: 0 %
Free Kappa Lt Chains,Ur: 2.48 mg/L (ref 1.17–86.46)
Free Kappa/Lambda Ratio: 0.44 — ABNORMAL LOW (ref 1.83–14.26)
Free Lambda Lt Chains,Ur: 5.66 mg/L (ref 0.27–15.21)
GAMMA GLOBULIN URINE: 0 %
Total Protein, Urine-Ur/day: 273 mg/24 hr — ABNORMAL HIGH (ref 30–150)
Total Protein, Urine: 10.9 mg/dL
Total Volume: 2500

## 2022-04-28 NOTE — Telephone Encounter (Signed)
Patient would like an order for portable oxygen concentrator. Patient would like to use [facility] in [place]. Patient phone number is [phone].

## 2022-04-29 NOTE — Telephone Encounter (Signed)
Called and spoke with patient. He stated that he would like to have a POC order sent to [place]. I advised him that we would need to walk him and get him qualified for a POC since it looks like we have never ordered O2 for him before. He verbalized understanding. He has been scheduled to see TP on 05/13/22 at 2pm.   While on the phone, he wanted to know if Dr. Vaughan Browner had heard from the pharmacy team in regards to the [place]. I looked in his chart and didn't see anything.   Dr. Vaughan Browner, have you heard anything in regards to his [place]?

## 2022-04-30 ENCOUNTER — Other Ambulatory Visit (HOSPITAL_COMMUNITY)
Admission: RE | Admit: 2022-04-30 | Discharge: 2022-04-30 | Disposition: A | Discharge status: Home / Self Care | Payer: 59 | Source: Ambulatory Visit | Attending: Cardiology | Admitting: Cardiology

## 2022-04-30 DIAGNOSIS — R0609 Other forms of dyspnea: Secondary | ICD-10-CM | POA: Diagnosis present

## 2022-04-30 LAB — BASIC METABOLIC PANEL
Anion gap: 8 (ref 5–15)
BUN: 18 mg/dL (ref 8–23)
CO2: 25 mmol/L (ref 22–32)
Calcium: 8.4 mg/dL — ABNORMAL LOW (ref 8.9–10.3)
Chloride: 103 mmol/L (ref 98–111)
Creatinine, Ser: 0.92 mg/dL (ref 0.61–1.24)
GFR, Estimated: >60 mL/min (ref >60)
Glucose, Bld: 139 mg/dL — ABNORMAL HIGH (ref 70–99)
Potassium: 3.7 mmol/L (ref 3.5–5.1)
Sodium: 136 mmol/L (ref 135–145)

## 2022-05-02 ENCOUNTER — Other Ambulatory Visit: Payer: Self-pay | Admitting: Gastroenterology

## 2022-05-03 NOTE — Telephone Encounter (Signed)
Yes okay to refill. thanks

## 2022-05-03 NOTE — Progress Notes (Deleted)
Cardiology Office Note:    Date:  05/03/2022   ID:  [name], DOB 01-Sep-1950, MRN [id number]  PCP:  Isaac Bliss, Rayford Halsted, MD  Cardiologist:  Kirk Ruths, MD  Electrophysiologist:  None   Referring MD: Isaac Bliss, Estel*   Chief Complaint: follow-up of diastolic CHF  History of Present Illness:    [name] is a 72 y.o. male with a history of CAD s/p CABG x4 (LIMA to LAD, SVG to Diag, SVG to OM, SVG to PDA) in 07/2004, severe aortic stenosis s/p TAVR in [id number], chronic diastolic CHF with EF of [id number] on most recent Echo on 06/06/2020, atrial fibrillation/flutter s/p Watchman procedure in 12/2021 no longer on anticoagulation, pulmonary fibrosis, non-alcoholic cirrhosis,  hypertension, hyperlipidemia, obstructive sleep apnea, thrombocytopenia, and obesity who is followed by Dr. Stanford Breed and presents today for follow-up of CHF.  Patient has a significant cardiac history. He has known CAD with remote CABG x4 in 07/2004. He also has a history of atrial fibrillation/ flutter s/p multiple DCCVs in the past. He underwent Watchman procedure in 12/2021 and is now no longer on anticoagulation. He has had progression of his aortic stenosis recently to severe range. He underwent TAVR on 03/16/2022. R/LHC prior to this showed occluded SVG to PDA and SVG to OM1 but patent LIMA to LAD and 1st Diag. Post-op course was complicated by hypervolemia and hypoxia. He also has a history of atrial fibrillation/ flutter s/p multiple DCCVs in the past. He underwent Watchman procedure in 12/2021 and is now no longer on anticoagulation.   Patient was last seen by Dr. Stanford Breed on 04/23/2022 at which time he reported continued dyspnea on exertion when he is not wearing his O2 and worsening lower extremity edema. Post-op Echo that same day showed severely dilated LVH with LVEF of 50-55%, mild LVH, and grade 2 diastolic dysfunction as well as stable aortic prosthesis with mildly elevated gradients (mean  gradient 18.5 with trace AI).  Admission was discussed but patient preferred to stay home. Therefore, Lasix was stopped and he was started on Torsemide twice daily with close follow-up arranged.  Of note, PYP scan is also pending given concern for cardiac amyloidosis.   Chronic Diastolic CHF  Patient has been struggling with volume overload and hypoxia since recent TAVR in 03/2022. Echo in 04/2022 showed everely dilated LVH with LVEF of 50-55%, mild LVH, and grade 2 diastolic dysfunction. Lasix was stopped at visit on 04/23/2022 and he was started on Torsemide instead. *** - *** Weight *** - Continue Torsemide 60mg  twice daily.  - Continue Coreg 3.125mg  twice daily.  - Continue Jardiance 10mg  daily.  - Continue daily weights and sodium/fluid restrictions.  - Will repeat BMET today. - PYP scan is scheduled for 05/26/2022 given concern for cardiac amyloidosis.    CAD S/p remote CABG x4 in 2006. LHC prior to recent TAVR showed  occluded SVG to PDA and SVG to OM1 but patent LIMA to LAD and 1st Diag.  - No chest pain.  Continue aspirin, beta-blocker and high-intensity statin.    Paroxysmal Atrial Fibrillation/Flutter S/p Watchman Procedure S/p Watchman procedure in 12/2021 so no longer on anticoagulation.  - Maintaining sinus rhythm on exam.  - Continue Coreg 3.125mg  twice daily.  Severe Aortic Stenosis s/p TAVR in 03/2022 S/p recent TAVR on 03/16/2022. Recent Echo on 04/23/2022 showed stable aortic prosthesis with mildly elevated gradients. Mean gradient 18.5 with trace AI.    Hypertension BP relatively well controlled.  - Continue Coreg 3.125mg   twice daily.   Hyperlipidemia Most recent lipid panel in ***.  - Continue Lipitor 80mg  daily.   Past Medical History:  Diagnosis Date   Adjustment disorder with depressed mood 09/07/2007   Cataract    removed bilaterally   Chronic kidney disease    kidney stones   Cirrhosis ([place])    Colon polyps    Tubular Adenoma 2010   CONGESTIVE HEART  FAILURE 12/09/2008   CORONARY ARTERY DISEASE 2006   HYPERLIPIDEMIA 08/09/2006   HYPERTENSION 08/09/2006   MYOCARDIAL INFARCTION, HX OF 07/07/2004   NEPHROLITHIASIS, HX OF 08/09/2006   OBESITY 07/19/2008   Presence of Watchman left atrial appendage closure device 12/17/2021   40mm Watchman with Dr. Burt Knack   S/P TAVR (transcatheter aortic valve replacement) 03/16/2022   53mm S3UR via TF approach with Dr. Burt Knack and Dr. Lavonna Monarch   Sleep apnea    has a cpap - not using religiously    Past Surgical History:  Procedure Laterality Date   BIOPSY  11/18/2020   Procedure: BIOPSY;  Surgeon: Yetta Flock, MD;  Location: [facility];  Service: Gastroenterology;;   CARDIOVERSION N/A 12/14/2017   Procedure: CARDIOVERSION;  Surgeon: [name], MD;  Location: [facility];  Service: Cardiovascular;  Laterality: N/A;   CARDIOVERSION N/A 10/01/2019   Procedure: CARDIOVERSION;  Surgeon: Buford Dresser, MD;  Location: [facility];  Service: Cardiovascular;  Laterality: N/A;   COLONOSCOPY     COLONOSCOPY WITH PROPOFOL N/A 11/18/2020   Procedure: COLONOSCOPY WITH PROPOFOL;  Surgeon: Yetta Flock, MD;  Location: [facility];  Service: Gastroenterology;  Laterality: N/A;   CORONARY ARTERY BYPASS GRAFT  2006   ESOPHAGOGASTRODUODENOSCOPY (EGD) WITH PROPOFOL N/A 11/18/2020   Procedure: ESOPHAGOGASTRODUODENOSCOPY (EGD) WITH PROPOFOL;  Surgeon: Yetta Flock, MD;  Location: [facility];  Service: Gastroenterology;  Laterality: N/A;   INTRAOPERATIVE TRANSTHORACIC ECHOCARDIOGRAM N/A 03/16/2022   Procedure: INTRAOPERATIVE TRANSTHORACIC ECHOCARDIOGRAM;  Surgeon: [name], MD;  Location: [place] [facility];  Service: Open Heart Surgery;  Laterality: N/A;   LEFT ATRIAL APPENDAGE OCCLUSION N/A 12/17/2021   Procedure: LEFT ATRIAL APPENDAGE OCCLUSION;  Surgeon: [name], MD;  Location: [place] [facility];  Service: Cardiovascular;  Laterality: N/A;   POLYPECTOMY      POLYPECTOMY  11/18/2020   Procedure: POLYPECTOMY;  Surgeon: Yetta Flock, MD;  Location: [facility];  Service: Gastroenterology;;   RIGHT/LEFT HEART CATH AND CORONARY/GRAFT ANGIOGRAPHY N/A 02/17/2022   Procedure: RIGHT/LEFT HEART CATH AND CORONARY/GRAFT ANGIOGRAPHY;  Surgeon: [name], MD;  Location: [place] [facility];  Service: Cardiovascular;  Laterality: N/A;   TEE WITHOUT CARDIOVERSION N/A 12/17/2021   Procedure: TRANSESOPHAGEAL ECHOCARDIOGRAM (TEE);  Surgeon: [name], MD;  Location: [place] [facility];  Service: Cardiovascular;  Laterality: N/A;   TRANSCATHETER AORTIC VALVE REPLACEMENT, TRANSFEMORAL Right 03/16/2022   Procedure: Transcatheter Aortic Valve Replacement, Transfemoral;  Surgeon: [name], MD;  Location: [place] [facility];  Service: Open Heart Surgery;  Laterality: Right;    Current Medications: No outpatient medications have been marked as taking for the 05/06/22 encounter (Appointment) with Darreld Mclean, PA-C.     Allergies:   Patient has no known allergies.   Social History   Socioeconomic History   Marital status: Significant Other    Spouse name: Not on file   Number of children: 0   Years of education: Not on file   Highest education level: Not on file  Occupational History   Occupation: Passenger transport manager  Tobacco Use   Smoking  status: Never    Passive exposure: Never   Smokeless tobacco: Never  Vaping Use   Vaping Use: Never used  Substance and Sexual Activity   Alcohol use: Not Currently    Comment: occasional   Drug use: No   Sexual activity: Not on file  Other Topics Concern   Not on file  Social History Narrative   Not on file   Social Determinants of Health   Financial Resource Strain: Not on file  Food Insecurity: No Food Insecurity (12/17/2021)   Hunger Vital Sign    Worried About Running Out of Food in the Last Year: Never true    Ran Out of Food in the Last Year: Never true   Transportation Needs: No Transportation Needs (12/17/2021)   PRAPARE - Hydrologist (Medical): No    Lack of Transportation (Non-Medical): No  Physical Activity: Not on file  Stress: Not on file  Social Connections: Not on file     Family History: The patient's family history includes Bipolar disorder in his brother; Heart disease in his father; Hyperlipidemia in his mother; Hypertension in his mother; Ulcers in his father. There is no history of Colon cancer, Colon polyps, Esophageal cancer, Rectal cancer, or Stomach cancer.  ROS:   Please see the history of present illness.     EKGs/Labs/Other Studies Reviewed:    The following studies were reviewed:  Right/ Left Cardiac Catheterization 02/17/2022: 1.  Multivessel coronary artery disease with moderate left main stenosis, total occlusion of the proximal LAD, patency of the left circumflex with mild nonobstructive disease, and moderate heavily calcified RCA stenosis involving a large, dominant vessel 2.  Severe aortic stenosis by echo assessment, cardiac catheterization demonstrating mean transvalvular gradient 27 mmHg 3.  Mildly elevated intracardiac diastolic filling pressures with right atrial pressure of 9, RV pressure 47/10, PA pressure 57/9 with a mean of 25, and LVEDP 14 mmHg.  Pulmonary capillary wedge pressure unable to accurately determine 4.  Status post aortocoronary bypass surgery with continued patency of the LIMA to LAD and saphenous vein graft to first diagonal.  Chronic occlusion of the saphenous vein graft OM and saphenous vein graft to PDA.   Recommendations: Medical therapy for coronary artery disease, continue treatment for congestive heart failure, continued evaluation for TAVR.  Diagnostic Dominance: Right    _______________  Echocardiogram 04/23/2022: Impressions: 1. Left ventricular ejection fraction, by estimation, is 50 to 55%. The  left ventricle has low normal function. The  left ventricle has no regional  wall motion abnormalities. The left ventricular internal cavity size was  severely dilated. There is mild  concentric left ventricular hypertrophy. Left ventricular diastolic  parameters are consistent with Grade II diastolic dysfunction  (pseudonormalization). Elevated left ventricular end-diastolic pressure.   2. Right ventricular systolic function is normal. The right ventricular  size is normal.   3. Left atrial size was severely dilated.   4. Right atrial size was severely dilated.   5. The mitral valve is normal in structure. Mild mitral valve  regurgitation. No evidence of mitral stenosis.   6. Since last echo, the aortic valve has been replace. Gradient across  that valve is mildly elevated. Overall, the prosthesis is functioning  well. The aortic valve has been repaired/replaced. Aortic valve  regurgitation is trivial. No aortic stenosis is  present. There is a 26 mm Edwards valve present in the aortic position.  Aortic valve area, by VTI measures 1.61 cm. Aortic valve mean gradient  measures 18.5 mmHg. Aortic valve Vmax measures 2.97 m/s.   7. Aortic dilatation noted. There is mild dilatation of the aortic root  and of the ascending aorta, measuring 37 mm.   8. The inferior vena cava is normal in size with greater than 50%  respiratory variability, suggesting right atrial pressure of 3 mmHg.   EKG:  EKG not ordered today.   Recent Labs: 05/20/2021: B Natriuretic Peptide 1,190.0 03/18/2022: Magnesium 2.0 04/21/2022: ALT 25; Hemoglobin 11.9; Platelet Count 50 04/30/2022: BUN 18; Creatinine, Ser 0.92; Potassium 3.7; Sodium 136  Recent Lipid Panel    Component Value Date/Time   CHOL 118 02/12/2021 1438   CHOL 111 07/23/2019 1144   TRIG 66.0 02/12/2021 1438   HDL 40.30 02/12/2021 1438   HDL 48 07/23/2019 1144   CHOLHDL 3 02/12/2021 1438   VLDL 13.2 02/12/2021 1438   LDLCALC 65 02/12/2021 1438   LDLCALC 53 11/30/2019 0929   LDLDIRECT 198.2  07/11/2009 0925    Physical Exam:    Vital Signs: There were no vitals taken for this visit.    Wt Readings from Last 3 Encounters:  04/23/22 220 lb 3.2 oz (99.9 kg)  04/23/22 215 lb 9.6 oz (97.8 kg)  04/21/22 212 lb 8 oz (96.4 kg)     General: 72 y.o. male in no acute distress. HEENT: Normocephalic and atraumatic. Sclera clear. EOMs intact. Neck: Supple. No carotid bruits. No JVD. Heart: *** RRR. Distinct S1 and S2. No murmurs, gallops, or rubs. Radial and distal pedal pulses 2+ and equal bilaterally. Lungs: No increased work of breathing. Clear to ausculation bilaterally. No wheezes, rhonchi, or rales.  Abdomen: Soft, non-distended, and non-tender to palpation. Bowel sounds present in all 4 quadrants.  MSK: Normal strength and tone for age. *** Extremities: No lower extremity edema.    Skin: Warm and dry. Neuro: Alert and oriented x3. No focal deficits. Psych: Normal affect. Responds appropriately.   Assessment:    No diagnosis found.  Plan:     Disposition: Follow up in ***   Medication Adjustments/Labs and Tests Ordered: Current medicines are reviewed at length with the patient today.  Concerns regarding medicines are outlined above.  No orders of the defined types were placed in this encounter.  No orders of the defined types were placed in this encounter.   There are no Patient Instructions on file for this visit.   Signed, Darreld Mclean, PA-C  05/03/2022 11:28 AM    [place]

## 2022-05-04 ENCOUNTER — Telehealth: Payer: Self-pay

## 2022-05-04 NOTE — Telephone Encounter (Signed)
Contacted pt per Dr Lorenso Courier to let pt know: let Mr. Cattanach know that his labs did not show any signs of abnormal protein concerning for multiple myeloma. He did have elevated LDH, bilirubin, and reticulocytes. I think this is a sign that his red blood cells are being damaged by his new heart valve. I will let his cardiologist know about these findings. There is usually some RBC destruction with artificial valves, this is a common finding.   Please let him know he can reach out with any questions or concerns. There is no need for routine f/u in our clinic.   Pt acknowledged information and verbalized understanding.

## 2022-05-05 ENCOUNTER — Telehealth: Payer: Self-pay | Admitting: Cardiology

## 2022-05-05 NOTE — Telephone Encounter (Signed)
Patient states that he was to be seen sooner due to edema, but appts have been changed due to provider. Pt would like to know if he should be seen sooner still due to edema or if the 05/09 appt will be okay. Patient has also been added to the wait list for sooner appt.

## 2022-05-05 NOTE — Telephone Encounter (Signed)
Called patient, appointment tomorrow was cancelled, however May appointment to far out-  Per last note:  chronic diastolic congestive heart failure-he is volume overloaded on examination today.  I will discontinue Lasix.  Begin Demadex 60 mg twice daily.  Check potassium and renal function in 1 week.  I will have him seen next week to make sure that his volume status is improving.  We did discuss hospital admission today but he would prefer to be at home with possible.    Patient is unable to come on Friday- appointment offered, however able to schedule with DOD on Tuesday April, 9th next week with DOD (Dr.Croitoru) to review function as APP did not have any openings either.   Will route to MD's to make aware.  Thanks!

## 2022-05-06 ENCOUNTER — Ambulatory Visit: Payer: 59 | Admitting: Student

## 2022-05-06 ENCOUNTER — Ambulatory Visit: Payer: 59 | Admitting: Rheumatology

## 2022-05-06 ENCOUNTER — Telehealth: Payer: Self-pay | Admitting: Pharmacist

## 2022-05-06 DIAGNOSIS — J849 Interstitial pulmonary disease, unspecified: Secondary | ICD-10-CM

## 2022-05-06 DIAGNOSIS — Z5181 Encounter for therapeutic drug level monitoring: Secondary | ICD-10-CM

## 2022-05-06 MED ORDER — PIRFENIDONE 267 MG PO TABS: ORAL_TABLET | ORAL | 0 refills | Status: DC

## 2022-05-06 NOTE — Telephone Encounter (Signed)
Patient returned call regarding pirfenidone. He states he does not recall receiving a refund from [employer] I did advised that pharmacy rep today told me that he has no copay for pirfenidone. He states he will call the pharmacy next week to schedule shipment I home. I discussed that since he has been off of medication for > 2 weeks, he will have to re-titrate. Rx for titration dose of pirfenidone has been sent to [place] today.   If he is quoted a high copay, he will call me back directly for assistance  Knox Saliva, PharmD, MPH, BCPS, CPP Clinical Pharmacist (Rheumatology and Pulmonology)

## 2022-05-06 NOTE — Telephone Encounter (Addendum)
The issue was that patient's copay with copay card was $400 and without copay card is $100. The pharmacy had incorrectly billed copay card and left him with $400 copay.  Called [facility] to determine if they ever refunded patient his money which they did. Rep ran claim today and he has no copay. I tried to conference patient so we could set up his shipment while I had phamracy on the line however patient did not pick up and his VM box is full.  My Chart message has been sent.  ----- Message from Marshell Garfinkel, MD sent at 04/20/2022  3:53 PM EDT ----- Has not started Esbriet yet due to cost.  He tells me that it cost him $400 a month.  Can you please review his case and see what his options are.  Thank you.

## 2022-05-06 NOTE — Telephone Encounter (Signed)
I have not heard from pharmacy. Send a message to them  [facility] and [facility]- He has been approved for esbriet but feels the copay is still to high. Can we look as options for him. Thanks

## 2022-05-06 NOTE — Telephone Encounter (Signed)
Sorry _ I wanted to make sure I had enough time set aside for this since it took 3 hours with pharmacy last time. I have reached out to patient. He was refunded money that he was incorrectly charged by the pharmacy. His copay for pirfenidone at this time is $0. ATC patient to discuss but VM box is full. MyChart message sent and will f/u with him  Knox Saliva, PharmD, MPH, BCPS, CPP Clinical Pharmacist (Rheumatology and Pulmonology)

## 2022-05-07 IMAGING — DX DG CHEST 2V
2 series · 2 of 2 positions shown · non-contrast
Comparison: 07/30/2004

CLINICAL DATA: 70-year-old male with shortness of breath

EXAM:
CHEST - 2 VIEW

[chest pa]
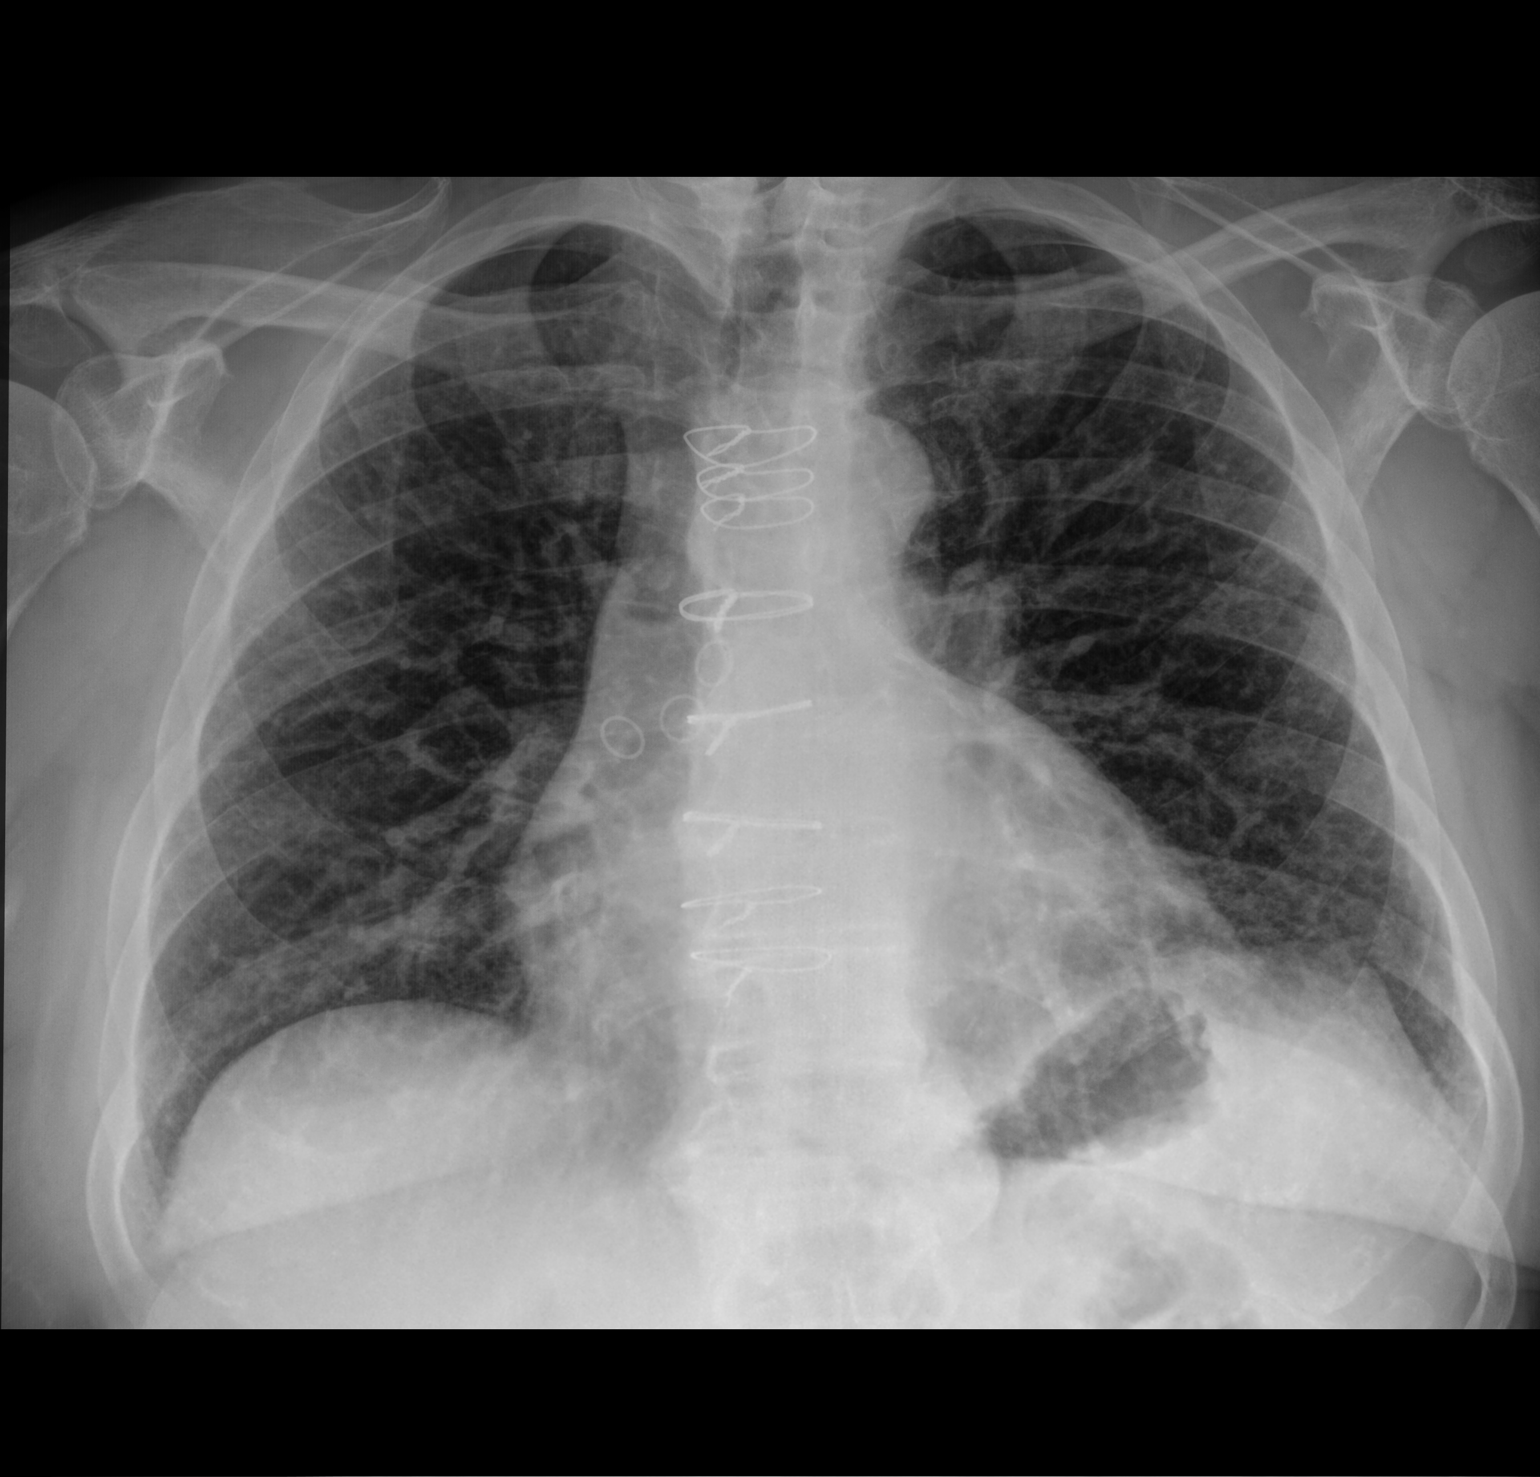

[chest lat]
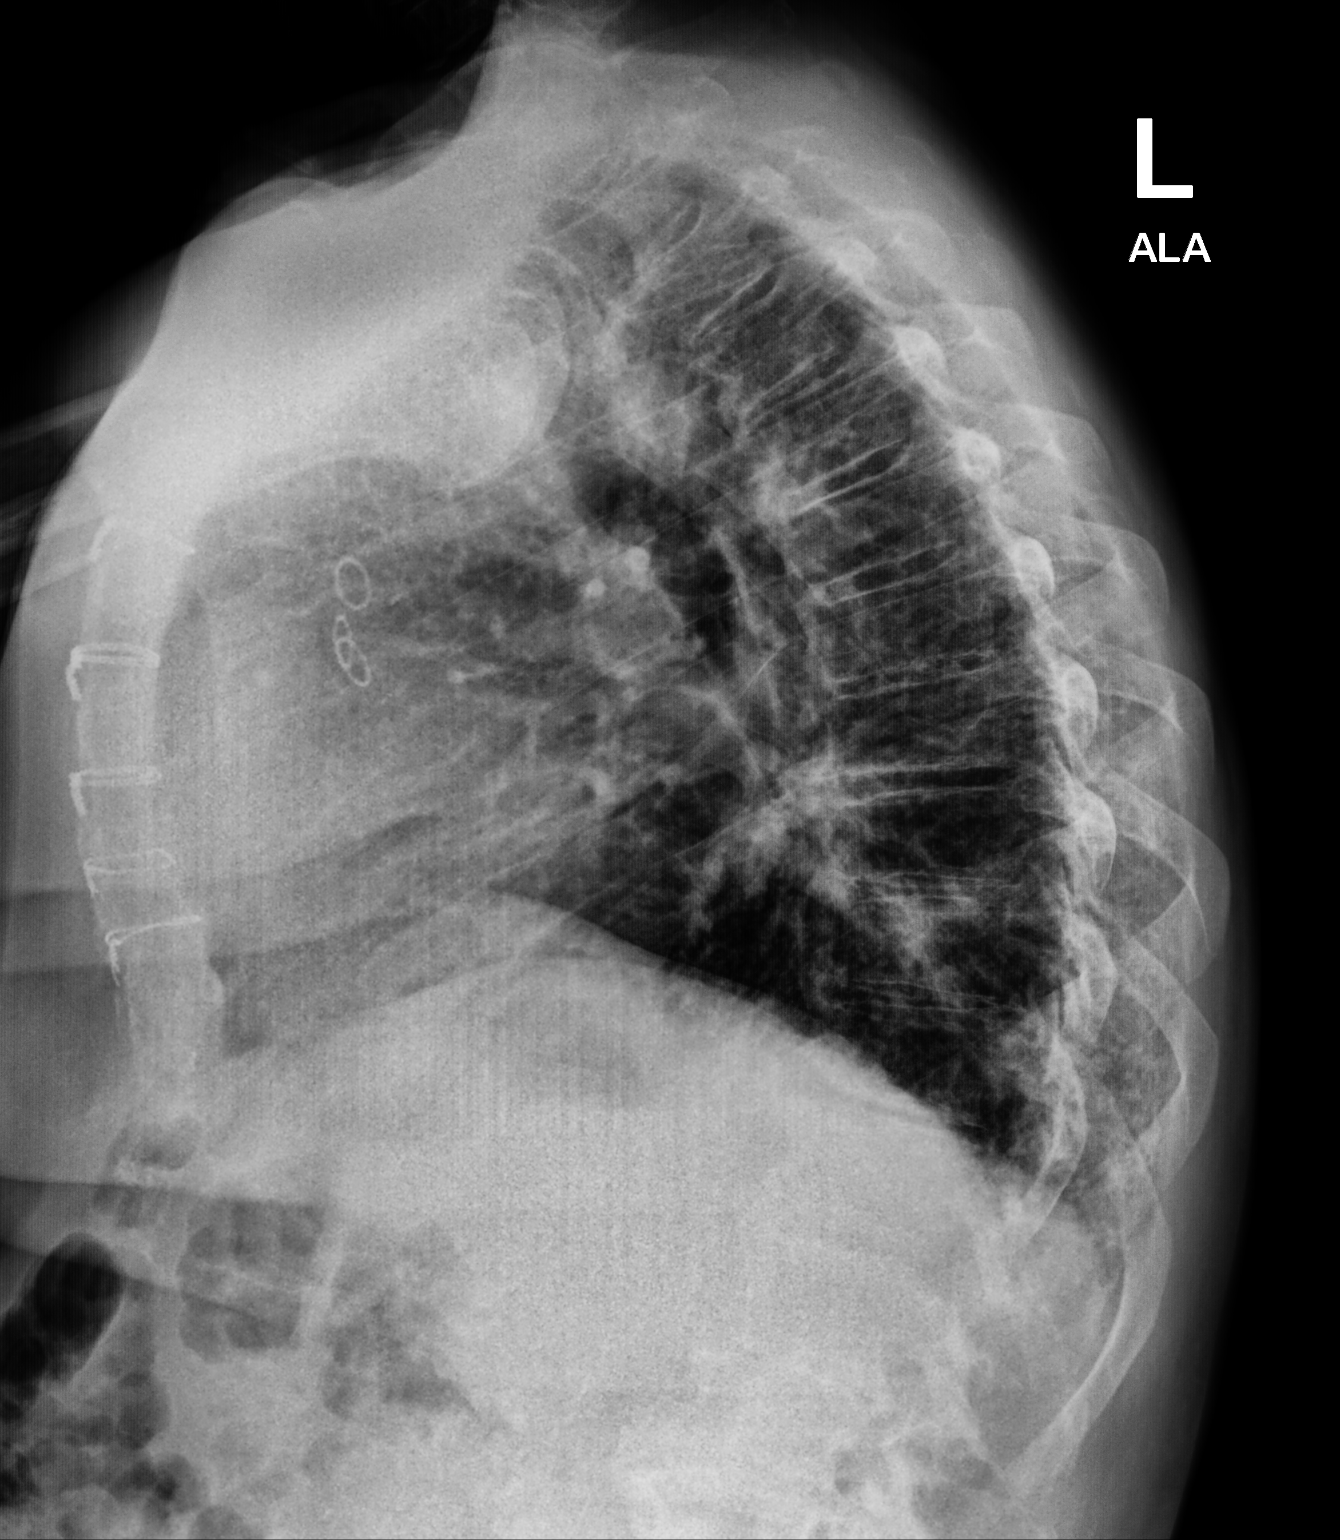

[2 of 2 positions shown; findings below may reference images not displayed]

FINDINGS: Cardiomediastinal silhouette unchanged in size and contour. Surgical
changes of median sternotomy and CABG.

No pneumothorax pleural effusion or confluent airspace disease.

Reticular opacities of the lungs, in a peripheral distribution,
increased from prior.

No acute displaced fracture. Degenerative changes of the spine.
IMPRESSION: Increased reticular opacities of the lungs, may reflect developing
fibrosis or potentially atypical infection. Follow-up chest x-ray
after therapy recommended, or alternatively consideration of chest
CT.

## 2022-05-11 ENCOUNTER — Ambulatory Visit: Payer: 59 | Attending: Cardiovascular Disease | Admitting: Cardiovascular Disease

## 2022-05-11 ENCOUNTER — Encounter: Payer: Self-pay | Admitting: Cardiovascular Disease

## 2022-05-11 VITALS — BP 109/48 | HR 67 | Ht 66.0 in | Wt 203.2 lb

## 2022-05-11 DIAGNOSIS — I5032 Chronic diastolic (congestive) heart failure: Secondary | ICD-10-CM | POA: Diagnosis not present

## 2022-05-11 DIAGNOSIS — Z952 Presence of prosthetic heart valve: Secondary | ICD-10-CM | POA: Diagnosis not present

## 2022-05-11 DIAGNOSIS — K766 Portal hypertension: Secondary | ICD-10-CM

## 2022-05-11 DIAGNOSIS — Z79899 Other long term (current) drug therapy: Secondary | ICD-10-CM

## 2022-05-11 DIAGNOSIS — I2510 Atherosclerotic heart disease of native coronary artery without angina pectoris: Secondary | ICD-10-CM

## 2022-05-11 DIAGNOSIS — K7469 Other cirrhosis of liver: Secondary | ICD-10-CM

## 2022-05-11 DIAGNOSIS — R609 Edema, unspecified: Secondary | ICD-10-CM | POA: Diagnosis not present

## 2022-05-11 DIAGNOSIS — I480 Paroxysmal atrial fibrillation: Secondary | ICD-10-CM

## 2022-05-11 DIAGNOSIS — D696 Thrombocytopenia, unspecified: Secondary | ICD-10-CM

## 2022-05-11 DIAGNOSIS — G4733 Obstructive sleep apnea (adult) (pediatric): Secondary | ICD-10-CM

## 2022-05-11 DIAGNOSIS — I2721 Secondary pulmonary arterial hypertension: Secondary | ICD-10-CM

## 2022-05-11 DIAGNOSIS — J849 Interstitial pulmonary disease, unspecified: Secondary | ICD-10-CM

## 2022-05-11 DIAGNOSIS — J9611 Chronic respiratory failure with hypoxia: Secondary | ICD-10-CM

## 2022-05-11 MED ORDER — TORSEMIDE 60 MG PO TABS: 60.0000 mg | ORAL_TABLET | Freq: Every day | ORAL | 3 refills | Status: DC

## 2022-05-11 NOTE — Progress Notes (Signed)
Cardiology Office Note:    Date:  05/11/2022   ID:  Tony ScarletJimmy C Cohenour, DOB 1951-01-13, MRN [id number]  PCP:  Philip AspenHernandez Acosta, Limmie PatriciaEstela Y, MD   [facility] Providers Cardiologist:  Olga MillersBrian Crenshaw, MD { Click to update primary MD,subspecialty MD or APP then REFRESH:1}    Referring MD: Philip AspenHernandez Acosta, Estel*   No chief complaint on file. ***  History of Present Illness:    [name] is a 72 y.o. 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 Excell Seltzerooper and Dr. Leafy RoWeldner   Sleep apnea    has a cpap - not using religiously    Past Surgical History:  Procedure Laterality Date   BIOPSY  11/18/2020   Procedure: BIOPSY;  Surgeon: Benancio DeedsArmbruster, Steven P, MD;  [facility];  Service: Gastroenterology;;   CARDIOVERSION N/A 12/14/2017   Procedure: CARDIOVERSION;  Surgeon: Chilton Siandolph, Tiffany, MD;  [facility];  Service: Cardiovascular;  Laterality: N/A;   CARDIOVERSION N/A 10/01/2019   Procedure: CARDIOVERSION;  Surgeon: Jodelle Redhristopher, Bridgette, MD;  [facility];  Service: Cardiovascular;  Laterality: N/A;   COLONOSCOPY     COLONOSCOPY WITH PROPOFOL N/A 11/18/2020   Procedure: COLONOSCOPY WITH PROPOFOL;  Surgeon: Benancio DeedsArmbruster, Steven P, MD;  Location: [facility];  Service: Gastroenterology;  Laterality: N/A;   CORONARY ARTERY  BYPASS GRAFT  2006   ESOPHAGOGASTRODUODENOSCOPY (EGD) WITH PROPOFOL N/A 11/18/2020   Procedure: ESOPHAGOGASTRODUODENOSCOPY (EGD) WITH PROPOFOL;  Surgeon: Benancio DeedsArmbruster, Steven P, MD;  Location: [facility];  Service: Gastroenterology;  Laterality: N/A;   INTRAOPERATIVE TRANSTHORACIC ECHOCARDIOGRAM N/A 03/16/2022   Procedure: INTRAOPERATIVE TRANSTHORACIC ECHOCARDIOGRAM;  Surgeon: Tonny Bollmanooper, Michael, MD;  [facility];  Service: Open Heart Surgery;  Laterality: N/A;   LEFT ATRIAL APPENDAGE OCCLUSION N/A 12/17/2021   Procedure: LEFT ATRIAL APPENDAGE OCCLUSION;  Surgeon: Tonny Bollmanooper, Michael, MD;  [facility];  Service: Cardiovascular;  Laterality: N/A;   POLYPECTOMY     POLYPECTOMY  11/18/2020   Procedure: POLYPECTOMY;  Surgeon: Benancio DeedsArmbruster, Steven P, MD;  Location: [facility];  Service: Gastroenterology;;   RIGHT/LEFT HEART CATH AND CORONARY/GRAFT ANGIOGRAPHY N/A 02/17/2022   Procedure: RIGHT/LEFT HEART CATH AND CORONARY/GRAFT ANGIOGRAPHY;  Surgeon: Tonny Bollmanooper, Michael, MD;  [facility];  Service: Cardiovascular;  Laterality: N/A;   TEE WITHOUT CARDIOVERSION N/A 12/17/2021   Procedure: TRANSESOPHAGEAL ECHOCARDIOGRAM (TEE);  Surgeon: Tonny Bollmanooper, Michael, MD;  [facility];  Service: Cardiovascular;  Laterality: N/A;   TRANSCATHETER AORTIC VALVE REPLACEMENT, TRANSFEMORAL Right 03/16/2022   Procedure: Transcatheter Aortic Valve Replacement, Transfemoral;  Surgeon: Tonny Bollmanooper, Michael, MD;  [facility];  Service: Open Heart Surgery;  Laterality: Right;    Current Medications: Current Meds  Medication Sig   acetaminophen (TYLENOL) 500 MG tablet Take 1,000 mg by mouth daily as needed for moderate pain  or headache.   amoxicillin (AMOXIL) 500 MG tablet Take 4 tablets (2,000 mg total) by mouth as directed. 1 HOUR PRIOR TO DENTAL APPOINTMENTS   aspirin EC 81 MG tablet Take 1 tablet (81 mg total) by mouth daily. Swallow whole.   atorvastatin  (LIPITOR) 80 MG tablet TAKE 1 TABLET BY MOUTH EVERY DAY   carvedilol (COREG) 3.125 MG tablet TAKE 1 TABLET BY MOUTH TWICE A DAY WITH FOOD   [employer] VITAMIN B12 1000 MCG tablet TAKE 1 TABLET BY MOUTH EVERY DAY   empagliflozin (JARDIANCE) 10 MG TABS tablet Take 1 tablet (10 mg total) by mouth daily before breakfast.   KLOR-CON M20 20 MEQ tablet TAKE 1 TABLET BY MOUTH EVERY DAY   Misc Natural Products (LIVER PROTECT PO) Take 1 capsule by mouth 2 (two) times daily. From [employer]   Pirfenidone 267 MG TABS Take 1 tab three times daily for 7 days, then 2 tabs three times daily for 7 days, then 3 tabs three times daily thereafter. *pt re-titrating*   predniSONE (DELTASONE) 20 MG tablet Take 2 tablets (40 mg total) by mouth daily with breakfast for 7 days, THEN 1.5 tablets (30 mg total) daily with breakfast for 7 days, THEN 1 tablet (20 mg total) daily with breakfast for 7 days, THEN 0.5 tablets (10 mg total) daily with breakfast.   tamsulosin (FLOMAX) 0.4 MG CAPS capsule TAKE ONE CAPSULE BY MOUTH EVERY DAY AFTER SUPPER   torsemide (DEMADEX) 20 MG tablet Take 3 tablets (60 mg total) by mouth 2 (two) times daily.     Allergies:   Patient has no known allergies.   Social History   Socioeconomic History   Marital status: Significant Other    Spouse name: Not on file   Number of children: 0   Years of education: Not on file   Highest education level: Not on file  Occupational History   Occupation: Barrister's clerk  Tobacco Use   Smoking status: Never    Passive exposure: Never   Smokeless tobacco: Never  Vaping Use   Vaping Use: Never used  Substance and Sexual Activity   Alcohol use: Not Currently    Comment: occasional   Drug use: No   Sexual activity: Not on file  Other Topics Concern   Not on file  Social History Narrative   Not on file   Social Determinants of Health   Financial Resource Strain: Not on file  Food Insecurity: No Food Insecurity (12/17/2021)    Hunger Vital Sign    Worried About Running Out of Food in the Last Year: Never true    Ran Out of Food in the Last Year: Never true  Transportation Needs: No Transportation Needs (12/17/2021)   PRAPARE - Administrator, Civil Service (Medical): No    Lack of Transportation (Non-Medical): No  Physical Activity: Not on file  Stress: Not on file  Social Connections: Not on file     Family History: The patient's ***family history includes Bipolar disorder in his brother; Heart disease in his father; Hyperlipidemia in his mother; Hypertension in his mother; Ulcers in his father. There is no history of Colon cancer, Colon polyps, Esophageal cancer, Rectal cancer, or Stomach cancer.  ROS:   Please see the history of present illness.    *** All other systems reviewed and are negative.  EKGs/Labs/Other Studies Reviewed:    The following studies were reviewed today: ***  EKG:  EKG is *** ordered today.  The ekg ordered today demonstrates ***  Recent Labs: 05/20/2021: B Natriuretic Peptide 1,190.0 03/18/2022: Magnesium 2.0 04/21/2022: ALT 25; Hemoglobin 11.9; Platelet Count 50 04/30/2022: BUN 18; Creatinine, Ser 0.92; Potassium 3.7; Sodium 136  Recent Lipid Panel    Component Value Date/Time   CHOL 118 02/12/2021 1438   CHOL 111 07/23/2019 1144   TRIG 66.0 02/12/2021 1438   HDL 40.30 02/12/2021 1438   HDL 48 07/23/2019 1144   CHOLHDL 3 02/12/2021 1438   VLDL 13.2 02/12/2021 1438   LDLCALC 65 02/12/2021 1438   LDLCALC 53 11/30/2019 0929   LDLDIRECT 198.2 07/11/2009 0925     Risk Assessment/Calculations:   {Does this patient have ATRIAL FIBRILLATION?:[id number]}            Physical Exam:    VS:  BP (!) 109/48   Pulse 67   Ht 5\' 6"  (1.676 m)   Wt 203 lb 3.2 oz (92.2 kg)   SpO2 92% Comment: O2 4L VIA [place]  BMI 32.80 kg/m     Wt Readings from Last 3 Encounters:  05/11/22 203 lb 3.2 oz (92.2 kg)  04/23/22 220 lb 3.2 oz (99.9 kg)  04/23/22 215 lb 9.6 oz (97.8 kg)      GEN: *** Well nourished, well developed in no acute distress HEENT: Normal NECK: No JVD; No carotid bruits LYMPHATICS: No lymphadenopathy CARDIAC: ***RRR, no murmurs, rubs, gallops RESPIRATORY:  Clear to auscultation without rales, wheezing or rhonchi  ABDOMEN: Soft, non-tender, non-distended MUSCULOSKELETAL:  No edema; No deformity  SKIN: Warm and dry NEUROLOGIC:  Alert and oriented x 3 PSYCHIATRIC:  Normal affect   ASSESSMENT:    No diagnosis found. PLAN:    In order of problems listed above:  ***      {Are you ordering a CV Procedure (e.g. stress test, cath, DCCV, TEE, etc)?   Press F2        :[id number]}    Medication Adjustments/Labs and Tests Ordered: Current medicines are reviewed at length with the patient today.  Concerns regarding medicines are outlined above.  No orders of the defined types were placed in this encounter.  No orders of the defined types were placed in this encounter.   Patient Instructions      Signed, Thurmon Fair, MD  05/11/2022 12:05 PM    [facility]

## 2022-05-11 NOTE — Patient Instructions (Addendum)
Medication Instructions:  Take Torsemide 60mg  daily.  If your weight increases to 205lb or more, then take an extra 60mg .  *If you need a refill on your cardiac medications before your next appointment, please call your pharmacy*   Lab Work: BMP, BNP, Mag- today If you have labs (blood work) drawn today and your tests are completely normal, you will receive your results only by: MyChart Message (if you have MyChart) OR A paper copy in the mail If you have any lab test that is abnormal or we need to change your treatment, we will call you to review the results.  Follow-Up: At [facility], you and your health needs are our priority.  As part of our continuing mission to provide you with exceptional heart care, we have created designated Provider Care Teams.  These Care Teams include your primary Cardiologist (physician) and Advanced Practice Providers (APPs -  Physician Assistants and Nurse Practitioners) who all work together to provide you with the care you need, when you need it.  We recommend signing up for the patient portal called "MyChart".  Sign up information is provided on this After Visit Summary.  MyChart is used to connect with patients for Virtual Visits (Telemedicine).  Patients are able to view lab/test results, encounter notes, upcoming appointments, etc.  Non-urgent messages can be sent to your provider as well.   To learn more about what you can do with MyChart, go to [email or web address].    Your next appointment:    Keep your already scheduled appointments

## 2022-05-12 LAB — BASIC METABOLIC PANEL
BUN/Creatinine Ratio: 15 (ref 10–24)
BUN: 14 mg/dL (ref 8–27)
CO2: 24 mmol/L (ref 20–29)
Calcium: 8.7 mg/dL (ref 8.6–10.2)
Chloride: 107 mmol/L — ABNORMAL HIGH (ref 96–106)
Creatinine, Ser: 0.96 mg/dL (ref 0.76–1.27)
Glucose: 131 mg/dL — ABNORMAL HIGH (ref 70–99)
Potassium: 4.4 mmol/L (ref 3.5–5.2)
Sodium: 146 mmol/L — ABNORMAL HIGH (ref 134–144)
eGFR: 84 mL/min/{1.73_m2} (ref >59)

## 2022-05-12 LAB — MAGNESIUM: Magnesium: 2.4 mg/dL — ABNORMAL HIGH (ref 1.6–2.3)

## 2022-05-12 LAB — BRAIN NATRIURETIC PEPTIDE: BNP: 202.2 pg/mL — ABNORMAL HIGH (ref 0.0–100.0)

## 2022-05-13 ENCOUNTER — Ambulatory Visit (INDEPENDENT_AMBULATORY_CARE_PROVIDER_SITE_OTHER): Payer: 59 | Admitting: Adult Health

## 2022-05-13 ENCOUNTER — Encounter: Payer: Self-pay | Admitting: Adult Health

## 2022-05-13 VITALS — BP 136/44 | HR 74 | Temp 98.3°F | Ht 66.0 in | Wt 208.4 lb

## 2022-05-13 DIAGNOSIS — R5381 Other malaise: Secondary | ICD-10-CM | POA: Diagnosis not present

## 2022-05-13 DIAGNOSIS — I5042 Chronic combined systolic (congestive) and diastolic (congestive) heart failure: Secondary | ICD-10-CM

## 2022-05-13 DIAGNOSIS — J849 Interstitial pulmonary disease, unspecified: Secondary | ICD-10-CM

## 2022-05-13 DIAGNOSIS — J9611 Chronic respiratory failure with hypoxia: Secondary | ICD-10-CM | POA: Diagnosis not present

## 2022-05-13 NOTE — Assessment & Plan Note (Signed)
Patient has generalized physical deconditioning.  Refer to pulmonary rehab.

## 2022-05-13 NOTE — Patient Instructions (Addendum)
Begin Esbriet as directed.  Order for POC , Use Oxygen at 5l/m with POC  Continue on Oxygen 4l/m , goal is to keep O2 sats >88-90%.  Refer to pulmonary rehab.  Activity as tolerated.  Change DME to [facility].  Follow up with Dr. Isaiah Serge in 6 weeks and As needed

## 2022-05-13 NOTE — Progress Notes (Signed)
@Patient  ID: [name], male    DOB: Feb 18, 1950, 72 y.o.   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  Cardiac CT 08/15/2021 Cirrhosis with splenomegaly, aortic atherosclerosis, peripheral areas of groundglass attenuation, septal thickening with basilar predominance.    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   CT chest 02/24/2022-unchanged pulmonary fibrosis and probable UIP pattern I have reviewed the images personally.   PFTs: 11/20/2021 FVC 3.58 [110%], FEV1 105 [130%], F/F 85, TLC 5.76 [102%], DLCO 16.38 [80%] Normal test   Labs: CTD serologies 09/23/2021 significant for rheumatoid factor 382  05/13/2022 Follow up: Pulmonary Fibrosis and Chronic Respiratory failure  Patient returns for a 73 month follow-up.  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  Autoimmune workup showed a positive rheumatoid factor.  Rheumatology evaluation felt not evidence of inflammatory arthritis.  Patient is recommended to begin Esbriet.  He is also started on oxygen for exertional desaturations.  Serial CT chest that showed pulmonary fibrosis with probable UIP pattern.  Patient says he did receive his Esbriet prescription yesterday and  is planning on starting it in the next couple days.  He did have to wait until insurance approved and it was more affordable.  Patient education given on Esbriet. Patient is on oxygen 4 L.  Patient says he has got to have a more portable system.  He still works full-time.  And is unable to leave home for very long without running out of oxygen.  Today in the office patient had O2 saturations 85% on room air.  He required 5 L pulsed oxygen on POC device to maintain O2 saturations greater than 88 to 90%. Patient also lives in [place] and needs to switch his DME company to a [employer]  He would like to switch from [place] to [place].  We also discussed pulmonary rehab.  Patient has become very weak and has low activity tolerance with multiple comorbidities and pulmonary fibrosis.  Gets short of breath with minimum activities.  Patient has a daily minimally productive cough.  No Known Allergies  Immunization History  Administered Date(s) Administered   Fluad Quad(high Dose 65+) 11/29/2018, 11/30/2019, 11/06/2020, 11/23/2021   Hep A / Hep B 10/10/2020   Hepatitis A, Adult 11/10/2020   Hepb-cpg 11/10/2020, 12/11/2020, 05/11/2021   Influenza Split 10/26/2011, 11/01/2017   Influenza Whole 12/03/2008   PFIZER(Purple Top)SARS-COV-2 Vaccination 04/16/2019, 05/08/2019   Pneumococcal Conjugate-13 07/04/2017   Pneumococcal Polysaccharide-23 11/29/2018   Tdap 09/29/2010, 02/12/2021    Past Medical History:  Diagnosis Date   Adjustment disorder with depressed mood 09/07/2007   Cataract    removed bilaterally   Chronic kidney disease    kidney stones   Cirrhosis    Colon polyps    Tubular Adenoma 2010   CONGESTIVE HEART FAILURE 12/09/2008   CORONARY ARTERY DISEASE 2006   HYPERLIPIDEMIA 08/09/2006  HYPERTENSION 08/09/2006   MYOCARDIAL INFARCTION, HX OF 07/07/2004   NEPHROLITHIASIS, HX OF 08/09/2006   OBESITY 07/19/2008   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 Excell Seltzer   S/P TAVR (transcatheter aortic valve replacement) 03/16/2022   26mm S3UR via TF approach with Dr. Excell Seltzer and Dr. Leafy Ro   Sleep apnea    has a cpap - not using religiously    Tobacco History: Social History   Tobacco Use  Smoking Status Never   Passive exposure: Never  Smokeless Tobacco Never   Counseling given: Not Answered   Outpatient Medications Prior to Visit  Medication Sig Dispense Refill   acetaminophen (TYLENOL) 500 MG tablet Take 1,000 mg by mouth daily as needed for moderate pain or headache.     aspirin EC 81 MG tablet Take 1 tablet (81 mg total) by mouth daily. Swallow whole. 30 tablet 12   atorvastatin (LIPITOR) 80 MG tablet TAKE 1 TABLET BY MOUTH EVERY DAY 90 tablet 1   carvedilol (COREG) 3.125 MG tablet TAKE 1 TABLET BY MOUTH TWICE A DAY WITH FOOD 60 tablet 5   [employer] VITAMIN B12 1000 MCG tablet TAKE 1 TABLET BY MOUTH EVERY DAY 180 tablet 1   empagliflozin (JARDIANCE) 10 MG TABS tablet Take 1 tablet (10 mg total) by mouth daily before breakfast. 30 tablet 11   KLOR-CON M20 20 MEQ tablet TAKE 1 TABLET BY MOUTH EVERY DAY 30 tablet 11   Misc Natural Products (LIVER PROTECT PO) Take 1 capsule by mouth 2 (two) times daily. From [employer]     Pirfenidone 267 MG TABS Take 1 tab three times daily for 7 days, then 2 tabs three times daily for 7 days, then 3 tabs three times daily thereafter. *pt re-titrating* 207 tablet 0   tamsulosin (FLOMAX) 0.4 MG CAPS capsule TAKE ONE CAPSULE BY MOUTH EVERY DAY AFTER SUPPER 90 capsule 1   torsemide 60 MG TABS Take 60 mg by mouth daily. If your weight increases to 205lb or more, take an extra 60mg  of torsemide. 180 tablet 3   amoxicillin (AMOXIL) 500 MG tablet Take 4 tablets (2,000 mg total) by mouth as directed. 1 HOUR PRIOR TO DENTAL APPOINTMENTS (Patient not taking: Reported on 05/13/2022) 12 tablet 6   No facility-administered medications prior to visit.     Review of Systems:   Constitutional:    No  weight loss, night sweats,  Fevers, chills,  +fatigue, or  lassitude.  HEENT:   No headaches,  Difficulty swallowing,  Tooth/dental problems, or  Sore throat,                No sneezing, itching, ear ache, nasal congestion, post nasal drip,   CV:  No chest pain,  Orthopnea, PND, swelling in lower extremities, anasarca, dizziness, palpitations, syncope.   GI  No heartburn, indigestion, abdominal pain, nausea, vomiting, diarrhea, change in bowel habits, loss of appetite, bloody stools.   Resp:  No chest wall deformity  Skin: no rash or lesions.  GU: no dysuria, change in color of urine, no urgency or frequency.  No flank pain, no hematuria   MS:  No joint pain or swelling.  No decreased range of motion.  No back pain.    Physical Exam  BP (!) 136/44 (BP Location: Left Arm, Patient Position: Sitting, Cuff Size: Normal)   Pulse 74   Temp 98.3 F (36.8 C) (Oral)   Ht 5\' 6"  (1.676 m)   Wt  208 lb 6.4 oz (94.5 kg)   SpO2 (!) 85% Comment: 2L sats up to 93%  BMI 33.64 kg/m   GEN: A/Ox3; pleasant , NAD, well nourished, elderly, on oxygen   HEENT:  /AT,   NOSE-clear, THROAT-clear, no lesions, no postnasal drip or exudate noted.   NECK:  Supple w/ fair ROM; no JVD; normal carotid impulses w/o bruits; no thyromegaly or nodules palpated; no lymphadenopathy.    RESP bibasilar crackles no accessory muscle use, no dullness to percussion  CARD:  RRR, 2/6 SM + click, 2+ peripheral edema, pulses intact, no cyanosis or clubbing.  GI:   Soft & nt; nml bowel sounds; no organomegaly or masses detected.   Musco: Warm bil, no deformities or joint swelling noted.   Neuro: alert, no focal deficits noted.    Skin: Warm, no lesions or rashes    Lab Results:      Imaging: ECHOCARDIOGRAM COMPLETE  Result Date: 04/23/2022    ECHOCARDIOGRAM REPORT   Patient Name:   [name] [facility] Date of Exam: 04/23/2022 Medical Rec #:  [id number]        Height:       66.0 in Accession #:     [id number]       Weight:       212.5 lb Date of Birth:  Sep 17, 1950        BSA:          2.052 m Patient Age:    71 years         BP:           120/57 mmHg Patient Gender: M                HR:           67 bpm. Exam Location:  [address] Procedure: 2D Echo, Cardiac Doppler and Color Doppler Indications:    Z95.2 S/p TAVR  History:        Patient has prior history of Echocardiogram examinations, most                 recent 03/17/2022. CHF, Previous Myocardial Infarction and CAD,                 S/p AVR (60mm Edwards Ultra), Arrythmias:Watchman; Risk                 Factors:Hypertension and Dyslipidemia.                 Aortic Valve: 26 mm Edwards valve is present in the aortic                 position.  Sonographer:    Samule Ohm RDCS Referring Phys: [phone] JILL D MCDANIEL IMPRESSIONS  1. Left ventricular ejection fraction, by estimation, is 50 to 55%. The left ventricle has low normal function. The left ventricle has no regional wall motion abnormalities. The left ventricular internal cavity size was severely dilated. There is mild concentric left ventricular hypertrophy. Left ventricular diastolic parameters are consistent with Grade II diastolic dysfunction (pseudonormalization). Elevated left ventricular end-diastolic pressure.  2. Right ventricular systolic function is normal. The right ventricular size is normal.  3. Left atrial size was severely dilated.  4. Right atrial size was severely dilated.  5. The mitral valve is normal in structure. Mild mitral valve regurgitation. No evidence of mitral stenosis.  6. Since last echo, the aortic valve has been replace. Gradient across that valve is mildly elevated. Overall, the prosthesis is  functioning well. The aortic valve has been repaired/replaced. Aortic valve regurgitation is trivial. No aortic stenosis is present. There is a 26 mm Edwards valve present in the aortic position. Aortic valve area, by VTI measures 1.61 cm. Aortic valve mean gradient measures  18.5 mmHg. Aortic valve Vmax measures 2.97 m/s.  7. Aortic dilatation noted. There is mild dilatation of the aortic root and of the ascending aorta, measuring 37 mm.  8. The inferior vena cava is normal in size with greater than 50% respiratory variability, suggesting right atrial pressure of 3 mmHg. FINDINGS  Left Ventricle: Left ventricular ejection fraction, by estimation, is 50 to 55%. The left ventricle has low normal function. The left ventricle has no regional wall motion abnormalities. The left ventricular internal cavity size was severely dilated. There is mild concentric left ventricular hypertrophy. Left ventricular diastolic parameters are consistent with Grade II diastolic dysfunction (pseudonormalization). Elevated left ventricular end-diastolic pressure. Right Ventricle: The right ventricular size is normal. No increase in right ventricular wall thickness. Right ventricular systolic function is normal. Left Atrium: Left atrial size was severely dilated. Right Atrium: Right atrial size was severely dilated. Pericardium: There is no evidence of pericardial effusion. Mitral Valve: The mitral valve is normal in structure. Mild mitral annular calcification. Mild mitral valve regurgitation. No evidence of mitral valve stenosis. Tricuspid Valve: The tricuspid valve is normal in structure. Tricuspid valve regurgitation is trivial. No evidence of tricuspid stenosis. Aortic Valve: Since last echo, the aortic valve has been replace. Gradient across that valve is mildly elevated. Overall, the prosthesis is functioning well. The aortic valve has been repaired/replaced. Aortic valve regurgitation is trivial. No aortic stenosis is present. Aortic valve mean gradient measures 18.5 mmHg. Aortic valve peak gradient measures 35.3 mmHg. Aortic valve area, by VTI measures 1.61 cm. There is a 26 mm Edwards valve present in the aortic position. Pulmonic Valve: The pulmonic valve was normal in structure. Pulmonic valve  regurgitation is mild. No evidence of pulmonic stenosis. Aorta: Aortic dilatation noted. There is mild dilatation of the aortic root and of the ascending aorta, measuring 37 mm. Venous: The inferior vena cava is normal in size with greater than 50% respiratory variability, suggesting right atrial pressure of 3 mmHg. IAS/Shunts: No atrial level shunt detected by color flow Doppler.  LEFT VENTRICLE PLAX 2D LVIDd:         6.50 cm   Diastology LVIDs:         5.00 cm   LV e' medial:    7.07 cm/s LV PW:         1.20 cm   LV E/e' medial:  25.7 LV IVS:        1.30 cm   LV e' lateral:   8.70 cm/s LVOT diam:     2.20 cm   LV E/e' lateral: 20.9 LV SV:         116 LV SV Index:   57 LVOT Area:     3.80 cm  RIGHT VENTRICLE RV S prime:     10.20 cm/s TAPSE (M-mode): 1.2 cm RVSP:           26.8 mmHg LEFT ATRIUM              Index        RIGHT ATRIUM           Index LA diam:        6.00 cm  2.92 cm/m   RA Pressure: 3.00 mmHg LA Vol (A2C):  103.0 ml 50.20 ml/m  RA Area:     29.30 cm LA Vol (A4C):   92.8 ml  45.22 ml/m  RA Volume:   107.00 ml 52.14 ml/m LA Biplane Vol: 98.9 ml  48.20 ml/m  AORTIC VALVE AV Area (Vmax):    1.70 cm AV Area (Vmean):   1.65 cm AV Area (VTI):     1.61 cm AV Vmax:           297.00 cm/s AV Vmean:          200.000 cm/s AV VTI:            0.722 m AV Peak Grad:      35.3 mmHg AV Mean Grad:      18.5 mmHg LVOT Vmax:         133.00 cm/s LVOT Vmean:        86.700 cm/s LVOT VTI:          0.305 m LVOT/AV VTI ratio: 0.42  AORTA Ao Root diam: 3.70 cm Ao Asc diam:  3.70 cm MITRAL VALVE                TRICUSPID VALVE MV Area (PHT): 2.60 cm     TR Peak grad:   23.8 mmHg MV Decel Time: 292 msec     TR Vmax:        244.00 cm/s MV E velocity: 182.00 cm/s  Estimated RAP:  3.00 mmHg                             RVSP:           26.8 mmHg                              SHUNTS                             Systemic VTI:  0.30 m                             Systemic Diam: 2.20 cm Chilton Siiffany [place] MD Electronically signed by  Chilton Siiffany [place]um[place] MD Signature Date/Time: 04/23/2022/2:15:58 PM    Final          Latest Ref Rng & Units 11/20/2021    2:56 PM  PFT Results  FVC-Pre L 3.50   FVC-Predicted Pre % 108   FVC-Post L 3.58   FVC-Predicted Post % 110   Pre FEV1/FVC % % 83   Post FEV1/FCV % % 85   FEV1-Pre L 2.89   FEV1-Predicted Pre % 123   FEV1-Post L 3.05   DLCO uncorrected ml/min/mmHg 16.38   DLCO UNC% % 80   DLCO corrected ml/min/mmHg 16.38   DLCO COR %Predicted % 80   DLVA Predicted % 73   TLC L 5.76   TLC % Predicted % 102   RV % Predicted % 107     No results found for: "NITRICOXIDE"      Assessment & Plan:   ILD (interstitial lung disease) (HCC) 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  Patient is to begin Esbriet as planned.  Patient education given in detail.  Patient will  return here in 4 to 6 weeks with follow-up and lab work.  Will need to watch liver function testing closely as he has a history of cirrhosis presumed from [place] with portal hypertension and  thrombocytopenia.  Recent LFTs were normal. Stable PLT.  Check LFTs and CBC on return visit Discussed case with Dr. Isaiah Serge     Plan  Patient Instructions  Begin Esbriet as directed.  Order for POC , Use Oxygen at 5l/m with POC  Continue on Oxygen 4l/m , goal is to keep O2 sats >88-90%.  Refer to pulmonary rehab.  Activity as tolerated.  Change DME to [employer].  Follow up with Dr. Isaiah Serge in 6 weeks and As needed       Chronic respiratory failure with hypoxia (HCC) Continue on oxygen to maintain O2 saturation greater than 88 to 90%.  Will need a portable oxygen concentrator to help with independence and mobility.  POC patient was able to maintain oxygen levels greater than 88 to 90% at 5 L pulsed oxygen.  Order has been sent to DME company.  Patient will  also change DME's from [employer] to [employer].  Physical deconditioning Patient has generalized physical deconditioning.  Refer to pulmonary rehab.  Chronic combined systolic and diastolic heart failure (HCC) Continue follow-up with cardiology.  Does not appear to be in overt overload.  Has chronic lower extremity edema.   I spent  40  minutes dedicated to the care of this patient on the date of this encounter to include pre-visit review of records, face-to-face time with the patient discussing conditions above, post visit ordering of testing, clinical documentation with the electronic health record, making appropriate referrals as documented, and communicating necessary findings to members of the patients care team.    Rubye Oaks, NP 05/13/2022

## 2022-05-13 NOTE — Assessment & Plan Note (Addendum)
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  Patient is to begin Esbriet as planned.  Patient education given in detail.  Patient will return here in 4 to 6 weeks with follow-up and lab work.  Will need to watch liver function testing closely as he has a history of cirrhosis presumed from [place] with portal hypertension and  thrombocytopenia.  Recent LFTs were normal. Stable PLT.  Check LFTs and CBC on return visit Discussed case with Dr. Isaiah Serge     Plan  Patient Instructions  Begin Esbriet as directed.  Order for POC , Use Oxygen at 5l/m with POC  Continue on Oxygen 4l/m , goal is to keep O2 sats >88-90%.  Refer to pulmonary rehab.  Activity as tolerated.  Change DME to [employer].  Follow up with Dr. Isaiah Serge in 6 weeks and As needed

## 2022-05-13 NOTE — Assessment & Plan Note (Signed)
Continue on oxygen to maintain O2 saturation greater than 88 to 90%.  Will need a portable oxygen concentrator to help with independence and mobility.  POC patient was able to maintain oxygen levels greater than 88 to 90% at 5 L pulsed oxygen.  Order has been sent to DME company.  Patient will also change DME's from [employer] to [facility].

## 2022-05-13 NOTE — Assessment & Plan Note (Signed)
Continue follow-up with cardiology.  Does not appear to be in overt overload.  Has chronic lower extremity edema.

## 2022-05-18 ENCOUNTER — Telehealth: Payer: Self-pay | Admitting: *Deleted

## 2022-05-18 MED ORDER — PIRFENIDONE 267 MG PO TABS
801.0000 mg | ORAL_TABLET | Freq: Three times a day (TID) | ORAL | 5 refills | Status: DC
Start: 2022-05-18 — End: 2022-09-03

## 2022-05-18 NOTE — Telephone Encounter (Signed)
Patient reminded of upcoming Amyloid appointment Secundino Ellithorpe Jacqueline, RN   

## 2022-05-18 NOTE — Telephone Encounter (Signed)
Called patient to confirm he received pirfenidone shipment. Patient states he did receive shipment and that he started pirfenidone yesterday. Was not told a copay but he will double check   He has f/u appt on 06/25/22 w Dr. Isaiah Serge. Can repeat LFTs at that visit. Future lab order placed today.  Chesley Mires, PharmD, MPH, BCPS, CPP Clinical Pharmacist (Rheumatology and Pulmonology)

## 2022-05-19 ENCOUNTER — Encounter: Payer: Self-pay | Admitting: Cardiovascular Disease

## 2022-05-19 ENCOUNTER — Ambulatory Visit: Payer: 59 | Attending: Cardiovascular Disease | Admitting: Cardiovascular Disease

## 2022-05-19 VITALS — BP 118/44 | HR 65 | Ht 66.0 in | Wt 206.0 lb

## 2022-05-19 DIAGNOSIS — G4733 Obstructive sleep apnea (adult) (pediatric): Secondary | ICD-10-CM | POA: Diagnosis not present

## 2022-05-19 DIAGNOSIS — I2510 Atherosclerotic heart disease of native coronary artery without angina pectoris: Secondary | ICD-10-CM | POA: Diagnosis not present

## 2022-05-19 DIAGNOSIS — Z952 Presence of prosthetic heart valve: Secondary | ICD-10-CM | POA: Diagnosis not present

## 2022-05-19 DIAGNOSIS — Z951 Presence of aortocoronary bypass graft: Secondary | ICD-10-CM | POA: Diagnosis not present

## 2022-05-19 DIAGNOSIS — K766 Portal hypertension: Secondary | ICD-10-CM

## 2022-05-19 DIAGNOSIS — I5042 Chronic combined systolic (congestive) and diastolic (congestive) heart failure: Secondary | ICD-10-CM

## 2022-05-19 DIAGNOSIS — J849 Interstitial pulmonary disease, unspecified: Secondary | ICD-10-CM

## 2022-05-19 DIAGNOSIS — R600 Localized edema: Secondary | ICD-10-CM

## 2022-05-19 DIAGNOSIS — Z95818 Presence of other cardiac implants and grafts: Secondary | ICD-10-CM

## 2022-05-19 DIAGNOSIS — I480 Paroxysmal atrial fibrillation: Secondary | ICD-10-CM

## 2022-05-19 DIAGNOSIS — I2721 Secondary pulmonary arterial hypertension: Secondary | ICD-10-CM

## 2022-05-19 NOTE — Progress Notes (Signed)
Cardiology Office Note    Date:  05/26/2022   ID:  [name], [name] April 17, 1950, MRN [id number]  PCP:  Philip Aspen, Limmie Patricia, MD  Cardiologist:  Nicki Guadalajara, MD   Chief Complaint  Patient presents with   Follow-up    3 months sleep.   Headache   Edema    Feet and ankles.    History of Present Illness:  [name] is a 72 y.o. male who is followed by Dr. Jens Som for cardiology care.  I last saw him for a sleep evaluation in June 2021.  He presents for 80-month follow-up sleep evaluation.  [name] has history of known CAD and is status post CABG revascularization in June 2006.  He has a history of atrial fibrillation/flutter and is status post Watchman device therefore not on anticoagulation.  He underwent TAVR in February 2024.  He also has issues with diastolic heart failure, pulmonary fibrosis, nonalcoholic cirrhosis with splenomegaly, and portal venous hypertension/thrombocytopenia.  He was diagnosed with severe sleep apnea in 2019 and was found to have an AHI of 35.0, RDI 38.1, and REM AHI 41.9.  Oxygen desaturated to 84%.  His CPAP set up date was November 22, 2017 with [facility] as his DME company.  Apparently he used CPAP initially but in June 2020 stopped using therapy.  He was typically falling asleep on a recliner.  When I saw him in June 2021, his latest date of use was July 16, 2018.  At that time, I spent considerable time with him and again reeducated him on the benefits of CPAP therapy particular with his cardiovascular disease.  Since I last saw him, [name] still has his CPAP device.  Use is minimal and over the month of March 2024 use was 1 day for only 15 minutes.  His CPAP unit is currently set at a range of 9 to 16 cm of water.  He states he still sleeps in a recliner and has not used therapy for several years.  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  He oftentimes wakes up at 6 AM and his sleep has been nonrestorative with  frequent awakenings after 3 AM.  He has continued to have leg swelling.  He is followed by Dr. Isaiah Serge for his lung disease.  He admits to leg swelling.  A chest x-ray on March 17, 2022 showed mild chronic interstitial lung disease.  An echo Doppler study on April 23, 2022 showed EF estimated 50 to 55% with mild concentric LVH.  There was grade 2 diastolic dysfunction.  He has a 26 mm Edwards TAVR valve.  Mean gradient was 18.5 mmHg.  There was mild dilation of the aorta.  An Epworth Sleepiness Scale score was calculated in the office today and this endorsed at 7.  He presents for a 3-year follow-up evaluation.  Past Medical History:  Diagnosis Date   Adjustment disorder with depressed mood 09/07/2007   Cataract    removed bilaterally   Chronic kidney disease    kidney stones   Cirrhosis    Colon polyps    Tubular Adenoma 2010   CONGESTIVE HEART FAILURE 12/09/2008   CORONARY ARTERY DISEASE 2006   HYPERLIPIDEMIA 08/09/2006   HYPERTENSION 08/09/2006   MYOCARDIAL INFARCTION, HX OF 07/07/2004   NEPHROLITHIASIS, HX OF 08/09/2006   OBESITY 07/19/2008   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 Excell Seltzer and Dr. Leafy Ro   Sleep apnea    has a cpap - not using religiously    Past Surgical History:  Procedure Laterality Date   BIOPSY  11/18/2020   Procedure: BIOPSY;  Surgeon: Benancio Deeds, MD;  Location: [facility];  Service: Gastroenterology;;   CARDIOVERSION N/A 12/14/2017   Procedure: CARDIOVERSION;  Surgeon: Chilton Si, MD;  Location: [facility] ENDOSCOPY;  Service: Cardiovascular;  Laterality: N/A;   CARDIOVERSION N/A 10/01/2019   Procedure: CARDIOVERSION;  Surgeon: Jodelle Red, MD;  Location: [facility] ENDOSCOPY;  Service: Cardiovascular;  Laterality: N/A;   COLONOSCOPY     COLONOSCOPY WITH  PROPOFOL N/A 11/18/2020   Procedure: COLONOSCOPY WITH PROPOFOL;  Surgeon: Benancio Deeds, MD;  Location: [facility] ENDOSCOPY;  Service: Gastroenterology;  Laterality: N/A;   CORONARY ARTERY BYPASS GRAFT  2006   ESOPHAGOGASTRODUODENOSCOPY (EGD) WITH PROPOFOL N/A 11/18/2020   Procedure: ESOPHAGOGASTRODUODENOSCOPY (EGD) WITH PROPOFOL;  Surgeon: Benancio Deeds, MD;  Location: [facility] ENDOSCOPY;  Service: Gastroenterology;  Laterality: N/A;   INTRAOPERATIVE TRANSTHORACIC ECHOCARDIOGRAM N/A 03/16/2022   Procedure: INTRAOPERATIVE TRANSTHORACIC ECHOCARDIOGRAM;  Surgeon: Tonny Bollman, MD;  Location: [facility] INVASIVE CV LAB;  Service: Open Heart Surgery;  Laterality: N/A;   LEFT ATRIAL APPENDAGE OCCLUSION N/A 12/17/2021   Procedure: LEFT ATRIAL APPENDAGE OCCLUSION;  Surgeon: Tonny Bollman, MD;  Location: [facility] INVASIVE CV LAB;  Service: Cardiovascular;  Laterality: N/A;   POLYPECTOMY     POLYPECTOMY  11/18/2020   Procedure: POLYPECTOMY;  Surgeon: Benancio Deeds, MD;  Location: [facility] ENDOSCOPY;  Service: Gastroenterology;;   RIGHT/LEFT HEART CATH AND CORONARY/GRAFT ANGIOGRAPHY N/A 02/17/2022   Procedure: RIGHT/LEFT HEART CATH AND CORONARY/GRAFT ANGIOGRAPHY;  Surgeon: Tonny Bollman, MD;  Location: [facility] INVASIVE CV LAB;  Service: Cardiovascular;  Laterality: N/A;   TEE WITHOUT CARDIOVERSION N/A 12/17/2021   Procedure: TRANSESOPHAGEAL ECHOCARDIOGRAM (TEE);  Surgeon: Tonny Bollman, MD;  Location: [facility] INVASIVE CV LAB;  Service: Cardiovascular;  Laterality: N/A;   TRANSCATHETER AORTIC VALVE REPLACEMENT, TRANSFEMORAL Right 03/16/2022   Procedure: Transcatheter Aortic Valve Replacement, Transfemoral;  Surgeon: Tonny Bollman, MD;  Location: [facility] INVASIVE CV LAB;  Service: Open Heart Surgery;  Laterality: Right;    Current Medications: Outpatient Medications Prior to Visit  Medication Sig Dispense Refill   acetaminophen (TYLENOL) 500 MG tablet Take 1,000 mg by mouth daily as needed for moderate pain or headache.      amoxicillin (AMOXIL) 500 MG tablet Take 4 tablets (2,000 mg total) by mouth as directed. 1 HOUR PRIOR TO DENTAL APPOINTMENTS (Patient not taking: Reported on 05/20/2022) 12 tablet 6   aspirin EC 81 MG tablet Take 1 tablet (81 mg total) by mouth daily. Swallow whole. 30 tablet 12   atorvastatin (LIPITOR) 80 MG tablet TAKE 1 TABLET BY MOUTH EVERY DAY 90 tablet 1   carvedilol (COREG) 3.125 MG tablet TAKE 1 TABLET BY MOUTH TWICE A DAY WITH FOOD 60 tablet 5   [employer]S VITAMIN B12 1000 MCG tablet TAKE 1 TABLET BY MOUTH EVERY DAY 180 tablet 1   empagliflozin (JARDIANCE) 10 MG TABS tablet Take 1 tablet (10 mg total) by mouth daily before breakfast. 30 tablet 11   KLOR-CON M20 20 MEQ tablet TAKE 1 TABLET BY MOUTH EVERY DAY 30 tablet 11   Misc Natural Products (LIVER PROTECT PO) Take 1 capsule by mouth 2 (two) times daily. From [employer]     Pirfenidone 267 MG TABS Take 3 tablets (801 mg total)  by mouth 3 (three) times daily with meals. 270 tablet 5   tamsulosin (FLOMAX) 0.4 MG CAPS capsule TAKE ONE CAPSULE BY MOUTH EVERY DAY AFTER SUPPER 90 capsule 1   torsemide 60 MG TABS Take 60 mg by mouth daily. If your weight increases to 205lb or more, take an extra  of torsemide. 180 tablet 3   No facility-administered medications prior to visit.     Allergies:   Patient has no known allergies.   Social History   Socioeconomic History   Marital status: Significant Other    Spouse name: Not on file   Number of children: 0   Years of education: Not on file   Highest education level: Not on file  Occupational History   Occupation: Barrister's clerk  Tobacco Use   Smoking status: Never    Passive exposure: Never   Smokeless tobacco: Never  Vaping Use   Vaping Use: Never used  Substance and Sexual Activity   Alcohol use: Not Currently    Comment: occasional   Drug use: No   Sexual activity: Not on file  Other Topics Concern   Not on file  Social History Narrative   Not on  file   Social Determinants of Health   Financial Resource Strain: Not on file  Food Insecurity: No Food Insecurity (12/17/2021)   Hunger Vital Sign    Worried About Running Out of Food in the Last Year: Never true    Ran Out of Food in the Last Year: Never true  Transportation Needs: No Transportation Needs (12/17/2021)   PRAPARE - Administrator, Civil Service (Medical): No    Lack of Transportation (Non-Medical): No  Physical Activity: Not on file  Stress: Not on file  Social Connections: Not on file    Social history is notable that he was born in [place].  He works for [employer] as a Technical sales engineer.  Family History:  The patient's family history includes Bipolar disorder in his brother; Heart disease in his father; Hyperlipidemia in his mother; Hypertension in his mother; Ulcers in his father.   ROS General: Negative; No fevers, chills, or night sweats;  HEENT: Negative; No changes in vision or hearing, sinus congestion, difficulty swallowing Pulmonary: Pulmonary fibrosis followed by Dr. Isaiah Serge with supplemental oxygen Cardiovascular: PAF, status post Watchman device, aortic stenosis, status post TAVR GI: Nonalcoholic cirrhosis GU: Negative; No dysuria, hematuria, or difficulty voiding Musculoskeletal: Negative; no myalgias, joint pain, or weakness Hematologic/Oncology: Negative; no easy bruising, bleeding Endocrine: Negative; no heat/cold intolerance; no diabetes Neuro: Negative; no changes in balance, headaches Skin: Negative; No rashes or skin lesions Psychiatric: Negative; No behavioral problems, depression Sleep: Positive for severe obstructive sleep apnea, initially better with CPAP therapy but with nonuse.  He has been sleeping in a recliner;positive for  snoring;  daytime sleepiness, hypersomnolence, bruxism, restless legs, hypnogognic hallucinations, no cataplexy Other comprehensive 14 point system review is negative.   PHYSICAL EXAM:    VS:  BP (!) 118/44 (BP Location: Left Arm, Patient Position: Sitting, Cuff Size: Normal)   Pulse 65   Ht  (1.676 m)   Wt 206 lb (93.4 kg)   BMI 33.25 kg/m     Repeat blood pressure by me was 120/58  Wt Readings from Last 3 Encounters:  05/20/22 213 lb 2 oz (96.7 kg)  05/19/22 206 lb (93.4 kg)  05/13/22 208 lb 6.4 oz (94.5 kg)    General: Alert, oriented, no distress.  Skin:  normal turgor, no rashes, warm and dry HEENT: Normocephalic, atraumatic. Pupils equal round and reactive to light; sclera anicteric; extraocular muscles intact;  Nose without nasal septal hypertrophy Mouth/Parynx benign; Mallinpatti scale 3 Neck: No JVD, no carotid bruits; normal carotid upstroke Lungs: clear to ausculatation and percussion; no wheezing or rales Chest wall: without tenderness to palpitation Heart: PMI not displaced, RRR, s1 s2 normal, 2/6 systolic murmur in aortic area, no diastolic murmur, no rubs, gallops, thrills, or heaves Abdomen: soft, nontender; no hepatosplenomehaly, BS+; abdominal aorta nontender and not dilated by palpation. Back: no CVA tenderness Pulses 2+ Musculoskeletal: full range of motion, normal strength, no joint deformities Extremities: Bilateral lower extremity edema; no clubbing cyanosis or edema, Homan's sign negative  Neurologic: grossly nonfocal; Cranial nerves grossly wnl Psychologic: Normal mood and affect   Studies/Labs Reviewed:   May 19, 2022 ECG (independently read by me): Normal sinus rhythm at 65 bpm, first-degree AV block with PR 260 ms, right bundle branch block, left anterior hemiblock.  Normal LVH.  Recent Labs:    Latest Ref Rng & Units 05/11/2022   12:37 PM 04/30/2022    3:15 PM 04/21/2022   11:00 AM  BMP  Glucose 70 - 99 mg/dL 629  528  413   BUN 8 - 27 mg/dL 14  18  15    Creatinine 0.76 - 1.27 mg/dL 2.44  0.10  2.72   BUN/Creat Ratio 10 - 24 15     Sodium 134 - 144 mmol/L 146  136  141   Potassium 3.5 - 5.2 mmol/L 4.4  3.7  3.9    Chloride 96 - 106 mmol/L 107  103  108   CO2 20 - 29 mmol/L 24  25  27    Calcium 8.6 - 10.2 mg/dL 8.7  8.4  9.0         Latest Ref Rng & Units 04/21/2022   11:00 AM 03/12/2022   10:04 AM 01/19/2022    1:04 PM  Hepatic Function  Total Protein 6.5 - 8.1 g/dL 6.3  6.7  7.1   Albumin 3.5 - 5.0 g/dL 3.2  2.8  3.3   AST 15 - 41 U/L 32  29  27   ALT 0 - 44 U/L 25  19  16    Alk Phosphatase 38 - 126 U/L 62  63  79   Total Bilirubin 0.3 - 1.2 mg/dL 3.6  3.0  2.8   Bilirubin, Direct 0.0 - 0.3 mg/dL   0.7        Latest Ref Rng & Units 04/21/2022   11:00 AM 03/21/2022    1:09 AM 03/20/2022    1:13 AM  CBC  WBC 4.0 - 10.5 K/uL 5.1  5.4  2.9   Hemoglobin 13.0 - 17.0 g/dL 53.6  64.4  03.4   Hematocrit 39.0 - 52.0 % 36.0  36.2  34.8   Platelets 150 - 400 K/uL 50  41  40    Lab Results  Component Value Date   MCV 96.0 04/21/2022   MCV 92.3 03/21/2022   MCV 94.6 03/20/2022   Lab Results  Component Value Date   TSH 2.51 02/12/2021   Lab Results  Component Value Date   HGBA1C 5.8 (A) 08/10/2021     BNP    Component Value Date/Time   BNP 202.2 (H) 05/11/2022 1237   BNP 1,190.0 (H) 05/20/2021 1234    ProBNP    Component Value Date/Time   PROBNP 319 09/26/2020 1035   PROBNP 36.0  12/03/2008 1631     Lipid Panel     Component Value Date/Time   CHOL 118 02/12/2021 1438   CHOL 111 07/23/2019 1144   TRIG 66.0 02/12/2021 1438   HDL 40.30 02/12/2021 1438   HDL 48 07/23/2019 1144   CHOLHDL 3 02/12/2021 1438   VLDL 13.2 02/12/2021 1438   LDLCALC 65 02/12/2021 1438   LDLCALC 53 11/30/2019 0929   LDLDIRECT 198.2 07/11/2009 0925   LABVLDL 12 07/23/2019 1144     RADIOLOGY: No results found.   Additional studies/ records that were reviewed today include:       Patient Name: [name], [name] Date: 10/04/2017 Gender: Male D.O.B: 28-May-1950 Age (years): 59 Referring Provider: Joni Reining NP Height (inches): 66 Interpreting Physician: Nicki Guadalajara MD,  ABSM Weight (lbs): 235 RPSGT: Ulyess Mort BMI: 38 MRN: [id number] Neck Size: 16.00   CLINICAL INFORMATION Sleep Study Type: Split Night CPAP   Indication for sleep study: Congestive Heart Failure, Excessive Daytime Sleepiness, Fatigue, Hypertension, Obesity, Snoring   Epworth Sleepiness Score: 9   SLEEP STUDY TECHNIQUE As per the AASM Manual for the Scoring of Sleep and Associated Events v2.3 (April 2016) with a hypopnea requiring 4% desaturations.   The channels recorded and monitored were frontal, central and occipital EEG, electrooculogram (EOG), submentalis EMG (chin), nasal and oral airflow, thoracic and abdominal wall motion, anterior tibialis EMG, snore microphone, electrocardiogram, and pulse oximetry. Continuous positive airway pressure (CPAP) was initiated when the patient met split night criteria and was titrated according to treat sleep-disordered breathing.   MEDICATIONS     apixaban (ELIQUIS) 5 MG TABS tablet         atorvastatin (LIPITOR) 80 MG tablet         furosemide (LASIX) 20 MG tablet         metoprolol succinate (TOPROL-XL) 50 MG 24 hr tablet         polyethylene glycol powder (GLYCOLAX/MIRALAX) powder         potassium chloride SA (KLOR-CON M20) 20 MEQ tablet         tamsulosin (FLOMAX) 0.4 MG CAPS capsule       Medications self-administered by patient taken the night of the study : N/A   RESPIRATORY PARAMETERS Diagnostic Total AHI (/hr):            35.0     RDI (/hr):         38.1     OA Index (/hr):            1.5       CA Index (/hr):      0.0 REM AHI (/hr):            41.9     NREM AHI (/hr):          33.3     Supine AHI (/hr):         41.9     Non-supine AHI (/hr):        32.4 Min O2 Sat (%):          84.0     Mean O2 (%):  93.1     Time below 88% (min):           4.1          Titration Optimal Pressure (cm):           10        AHI at Optimal Pressure (/hr):  0.0       Min O2 at Optimal Pressure (%):       92.0 Supine % at Optimal (%):        100      Sleep % at Optimal (%):         79           SLEEP ARCHITECTURE The recording time for the entire night was 360.5 minutes.   During a baseline period of 181.1 minutes, the patient slept for 156.0 minutes in REM and nonREM, yielding a sleep efficiency of 86.1%%. Sleep onset after lights out was 3.9 minutes with a REM latency of 112.5 minutes. The patient spent 24.0%% of the night in stage N1 sleep, 55.8%% in stage N2 sleep, 0.0%% in stage N3 and 20.2% in REM.   During the titration period of 174.9 minutes, the patient slept for 148.0 minutes in REM and nonREM, yielding a sleep efficiency of 84.6%%. Sleep onset after CPAP initiation was 7.8 minutes with a REM latency of 63.5 minutes. The patient spent 22.0%% of the night in stage N1 sleep, 67.6%% in stage N2 sleep, 0.0%% in stage N3 and 10.5% in REM.   CARDIAC DATA The 2 lead EKG demonstrated atrial fibrillation. The mean heart rate was 100.0 beats per minute. Other EKG findings include: PVCs.   LEG MOVEMENT DATA The total Periodic Limb Movements of Sleep (PLMS) were 0. The PLMS index was 0.0 .   IMPRESSIONS - Severe obstructive sleep apnea occurred during the diagnostic portion of the study (AHI 35.0/h; RDI 38.1/h); events were more severe during REM sleep (AHI 41.9/h).  CPAP was intiated and was titrated up to 10 cm water.  AHI at 10 cm was 0; RDI 4.4/h, but REM sleep was not achieved at 10 cm. - No significant central sleep apnea occurred during the diagnostic portion of the study (CAI = 0.0/hour). - Significant oxygen desaturation was noted during the diagnostic portion of the study (Min O2 84.0%). - The patient snored with moderate snoring volume during the diagnostic portion of the study. - EKG findings include PVCs. - Clinically significant periodic limb movements did not occur during sleep.   DIAGNOSIS - Obstructive Sleep Apnea (327.23 [G47.33 ICD-10])   RECOMMENDATIONS - Recommend an trial of CPAP Auto therapy at 9 to 16  cm H2O with heated humidification. A Medium size Fisher&Paykel Full Face Mask Simplus mask was used for the titration. - Efforts should be made for nasal and oropharyngeal patency. - Avoid alcohol, sedatives and other CNS depressants that may worsen sleep apnea and disrupt normal sleep architecture. - Sleep hygiene should be reviewed to assess factors that may improve sleep quality. - Weight management and regular exercise should be initiated or continued. - Recommend a download be obtained in 30 days and sleep clinic evaluation after 4 weeks of therapy.     ASSESSMENT:    1. OSA (obstructive sleep apnea)   2. Coronary artery disease involving native coronary artery of native heart without angina pectoris   3. Hx of CABG   4. S/P TAVR (transcatheter aortic valve replacement)   5. ILD (interstitial lung disease)   6. PAH (pulmonary artery hypertension)   7. Bilateral lower extremity edema   8. Chronic combined systolic and diastolic heart failure   9. PAF (paroxysmal atrial fibrillation)   10. Presence of Watchman left atrial appendage closure device   11. Portal hypertension     PLAN:  [name] is a 72 year old gentleman who is  followed by Dr. Olga Millers for primary care,  Dr. Isaiah Serge for pulmonary fibrosis, and Dr. Philip Aspen for primary care.  He has a history of CAD, status post CABG revascularization, atrial flutter/fibrillation, status post Watchman device, aortic stenosis, status post TAVR, as well as a history of nonalcoholic cirrhosis with splenomegaly, portal venous hypertension/thrombocytopenia.  He was diagnosed with severe obstructive sleep apnea in 2019 with his overall AHI of 35.0/h and REM AHI 41.9/h.  Over the past 4 years, he has rarely used therapy.  He essentially sleeps in a recliner since he cannot sleep lying flat.  He is DME company has been [facility] which no longer provide CPAP support.  He continues to have his ResMed air sense 10  AutoSet unit with set up date November 22, 2017 since he was initially compliant.  He will need to be transferred to a new DME company if he wishes to proceed with CPAP.  Over the past month, he used therapy for 15 minutes and obviously never fell asleep.  I have made changes to his current set up and will change his pressures to a range of 7 to 18 cm of water.  His ramp start pressure currently is set at 4.  I spent considerable time with him today and discussed potential adverse cardiovascular consequences with reference to his severe sleep apnea including its effects on hypertension, nocturnal arrhythmias, recurrent atrial fibrillation, in addition to potential adverse consequences with reference to insulin resistance, increased inflammatory markers, and nocturnal GERD.  I also discussed potential for nocturnal hypoxemia contributing to nocturnal ischemia particularly with his underlying CAD and coronary revascularization with at least 1 graft already occluded.  He has had issues with significant lower extremity edema and on exam today he has 4+ edema.  He has a prescription for torsemide and apparently has been taking 60 mg at bedtime.  This resulted in significant nocturia.  I have suggested he adjust the timing of taking his torsemide and ideally he should be taking in the morning or early afternoon.  He is on Esbriet for his interstitial lung disease.  He currently is on Jardiance 10 mg.  I have suggested he obtain support stockings to at least up to the knees which should help with his peripheral edema.  I discussed optimal sleep duration at 7 and 9 hours.  We provided him with the information to contact [facility] and based on his insurance he will need to be set up with a new DME company.  I will see him in 6 months for reevaluation or sooner as indicated.   Medication Adjustments/Labs and Tests Ordered: Current medicines are reviewed at length with the patient today.  Concerns regarding medicines are  outlined above.  Medication changes, Labs and Tests ordered today are listed in the Patient Instructions below. Patient Instructions  Medication Instructions:  Take Torsemide 3 tablets (60 mg) in the morning and 3 tablets (60 mg) at 4:00 PM.   *If you need a refill on your cardiac medications before your next appointment, please call your pharmacy*   Follow-Up: At [facility], you and your health needs are our priority.  As part of our continuing mission to provide you with exceptional heart care, we have created designated Provider Care Teams.  These Care Teams include your primary Cardiologist (physician) and Advanced Practice Providers (APPs -  Physician Assistants and Nurse Practitioners) who all work together to provide you with the care you need, when you need it.  We  recommend signing up for the patient portal called "MyChart".  Sign up information is provided on this After Visit Summary.  MyChart is used to connect with patients for Virtual Visits (Telemedicine).  Patients are able to view lab/test results, encounter notes, upcoming appointments, etc.  Non-urgent messages can be sent to your provider as well.   To learn more about what you can do with MyChart, go to [email or web address].    Your next appointment:   6 month(s)  Provider:   Nicki Guadalajara, MD (sleep)      Signed, Nicki Guadalajara, MD  05/26/2022 10:16 AM    [facility] [address], [address], [place], [place]  [address] Phone: [phone]

## 2022-05-19 NOTE — Patient Instructions (Signed)
Medication Instructions:  Take Torsemide 3 tablets (60 mg) in the morning and 3 tablets (60 mg) at 4:00 PM.   *If you need a refill on your cardiac medications before your next appointment, please call your pharmacy*   Follow-Up: At [facility], you and your health needs are our priority.  As part of our continuing mission to provide you with exceptional heart care, we have created designated Provider Care Teams.  These Care Teams include your primary Cardiologist (physician) and Advanced Practice Providers (APPs -  Physician Assistants and Nurse Practitioners) who all work together to provide you with the care you need, when you need it.  We recommend signing up for the patient portal called "MyChart".  Sign up information is provided on this After Visit Summary.  MyChart is used to connect with patients for Virtual Visits (Telemedicine).  Patients are able to view lab/test results, encounter notes, upcoming appointments, etc.  Non-urgent messages can be sent to your provider as well.   To learn more about what you can do with MyChart, go to [email or web address].    Your next appointment:   6 month(s)  Provider:   Nicki Guadalajara, MD (sleep)

## 2022-05-20 ENCOUNTER — Encounter: Payer: Self-pay | Admitting: Gastroenterology

## 2022-05-20 ENCOUNTER — Ambulatory Visit: Payer: 59 | Admitting: Gastroenterology

## 2022-05-20 VITALS — BP 130/48 | HR 72 | Ht 63.25 in | Wt 213.1 lb

## 2022-05-20 DIAGNOSIS — K8020 Calculus of gallbladder without cholecystitis without obstruction: Secondary | ICD-10-CM | POA: Diagnosis not present

## 2022-05-20 DIAGNOSIS — I8500 Esophageal varices without bleeding: Secondary | ICD-10-CM | POA: Diagnosis not present

## 2022-05-20 DIAGNOSIS — K7460 Unspecified cirrhosis of liver: Secondary | ICD-10-CM | POA: Diagnosis not present

## 2022-05-20 NOTE — Patient Instructions (Signed)
If your blood pressure at your visit was 140/90 or greater, please contact your primary care physician to follow up on this.  _______________________________________________________  If you are age 73 or older, your body mass index should be between 23-30. Your Body mass index is 37.46 kg/m. If this is out of the aforementioned range listed, please consider follow up with your Primary Care Provider.  If you are age 13 or younger, your body mass index should be between 19-25. Your Body mass index is 37.46 kg/m. If this is out of the aformentioned range listed, please consider follow up with your Primary Care Provider.   ________________________________________________________  The [facility] providers would like to encourage you to use [facility] to communicate with providers for non-urgent requests or questions.  Due to long hold times on the telephone, sending your provider a message by [facility] may be a faster and more efficient way to get a response.  Please allow 48 business hours for a response.  Please remember that this is for non-urgent requests.  _______________________________________________________  Due to recent changes in healthcare laws, you may see the results of your imaging and laboratory studies on MyChart before your provider has had a chance to review them.  We understand that in some cases there may be results that are confusing or concerning to you. Not all laboratory results come back in the same time frame and the provider may be waiting for multiple results in order to interpret others.  Please give [place] 48 hours in order for your provider to thoroughly review all the results before contacting the office for clarification of your results.   You will be due for Ultrasound of your liver in July and labs in September. We will reach out to you when it is time to schedule.  Thank you for entrusting me with your care and for choosing [facility], Dr. Ileene Patrick

## 2022-05-20 NOTE — Progress Notes (Signed)
HPI :  72 year old male with multiple medical problems here for a follow-up visit to discuss history of cirrhosis.  Recall he has a history of aortic stenosis now s/p TAVR, CHF, A. Fib  interstitial lung disease, oxygen dependence.   Recall he was diagnosed with cirrhosis during work-up for thrombocytopenia in the past few years.  He had lab work done to evaluate for chronic liver diseases which was negative for clear cause.  Patient denied any history of significant alcohol use.  He had an elevated IgG level but otherwise negative serologic work-up for autoimmune hepatitis.  Patient reports he used to weigh 260 pounds and has been having his entire life, suspect perhaps he has underlying cirrhosis from fatty liver disease.   Recall in October 2022 he had an EGD with me and found to have moderate size varix without high risk stigmata.  He had his metoprolol switched to Coreg.  His platelet levels been most recently around 50, previously for his A-fib he had been on Eliquis.  Fortunately he tolerated it well but more recently he has not been on any anticoagulation.  He is now status post TAVR in February, followed by pulmonary for interstitial lung disease, also has pulmonary hypertension.  Now has Watchman place device and for A-fib.  He is no longer on any anticoagulation, is only on baby aspirin.  He is not drinking any alcohol.  He has not had any overt decompensations to date.  Last bili was 3.6.  He has been struggling with lower extremity edema.  Last HCC screening was in December 2023 with an ultrasound, he had gallstones noted.  He also had a slightly dilated CBD.  As part of his workup for TAVR he underwent a CTA in January of this year and his bile ducts were normal and his liver appeared cirrhotic without any hepatomas.    Prior workup:  Colonoscopy 02/22/2017 - Diverticulosis in the entire examined colon. - Medium-sized lipoma in the ascending colon. - Internal hemorrhoids. - There was  significant looping of the colon. - The examination was otherwise normal. - No specimens collected.   Colonoscopy 09/24/2015 - One 5 mm polyp in the cecum, removed with a hot snare. Resected and retrieved. - Two 4 to 5 mm polyps in the ascending colon, removed with a cold snare. Resected and retrieved. - Two 4 to 5 mm polyps at the hepatic flexure, removed with a cold snare. Resected and retrieved. - Two 4 to 5 mm polyps in the transverse colon, removed with a cold snare. Resected and retrieved. - Two 4 to 5 mm polyps in the descending colon, removed with a cold snare. Resected and retrieved. - Three 3 to 5 mm polyps in the sigmoid colon, removed with a cold snare. Resected and retrieved. - One 6 mm polyp at the recto-sigmoid colon, removed with a hot snare. Resected and retrieved. - Diverticulosis in the entire examined colon. - Non-bleeding internal hemorrhoids. - The examination was otherwise normal.     Surgical [P], sigmoid, descending, hepatic flexure, cecum, ascending, transverse, rectosigmoid, polyp (13) - TUBULAR ADENOMA (9), SESSILE SERRATED ADENOMA (3) AND HYPERPLASTIC POLYP (1). NO HIGH GRADE DYSPLASIA OR MALIGNANCY IDENTIFIED.     EGD 11/18/20: Esophagogastric landmarks identified. - 1 cm hiatal hernia. - One moderate varix, other small columns which flattened. - Normal esophagus otherwise - Erosive gastropathy with no stigmata of recent bleeding. - Normal stomach otherwise. Biopsies taken to rule out H pylori - Duodenal erosions. - Normal duodenum otherwise.   (  8/17 - 13 adenomas, 1/19 - normal) Colonoscopy 11/18/20: One diminutive polyp at the ileocecal valve, removed with a cold snare. Resected and retrieved. - One 4 mm polyp at the hepatic flexure, removed with a cold snare. Resected and retrieved. - One 3 mm polyp in the transverse colon, removed with a cold snare. Resected and retrieved. - Diverticulosis in the entire examined colon. - Rectal  varices. - Internal hemorrhoids. - The examination was otherwise normal.   FINAL MICROSCOPIC DIAGNOSIS:   A. STOMACH, BIOPSY:  - Unremarkable gastric mucosa.  - Warthin-Starry negative for Helicobacter pylori.   B. COLON, IC VALVE, HEPATIC FLEXURE, TRANSVERSE, POLYPECTOMY:  - Tubular adenoma without high-grade dysplasia.  - Sessile serrated adenoma without cytologic dysplasia.    Switched metoprolol to Coreg and repeat colon in 5 years    RUQ [place] 01/14/22: IMPRESSION: 1. Three stones are identified in the gallbladder with wall thickening measuring 5.2 mm. No Murphy's sign or pericholecystic fluid. Recommend clinical correlation. 2. The common bile duct is dilated measuring 8.4 mm. 6 mm is typically considered the upper limits of normal. Recommend correlation with LFTs and symptoms. If there is concern for biliary obstruction, recommend MRCP. 3. Cirrhotic liver.  No underlying liver mass identified.  CT angio CAP 02/24/22: IMPRESSION: 1. Vascular findings and measurements pertinent to potential TAVR procedure, as detailed above. 2. Thickening and calcification of the aortic valve, compatible with reported clinical history of aortic stenosis. 3. Moderate to severe aortoiliac atherosclerosis. Severe aortic valve calcifications status post CABG. 4. Cirrhotic liver morphology with sequela of portal hypertension including splenomegaly and large splenorenal varices.    Past Medical History:  Diagnosis Date   Adjustment disorder with depressed mood 09/07/2007   Cataract    removed bilaterally   Chronic kidney disease    kidney stones   Cirrhosis    Colon polyps    Tubular Adenoma 2010   CONGESTIVE HEART FAILURE 12/09/2008   CORONARY ARTERY DISEASE 2006   HYPERLIPIDEMIA 08/09/2006   HYPERTENSION 08/09/2006   MYOCARDIAL INFARCTION, HX OF 07/07/2004   NEPHROLITHIASIS, HX OF 08/09/2006   OBESITY 07/19/2008   Presence of Watchman left atrial appendage closure device  12/17/2021   27mm Watchman with Dr. 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 Excell Seltzer and Dr. Leafy Ro   Sleep apnea    has a cpap - not using religiously     Past Surgical History:  Procedure Laterality Date   BIOPSY  11/18/2020   Procedure: BIOPSY;  Surgeon: Benancio Deeds, MD;  Location: [facility];  Service: Gastroenterology;;   CARDIOVERSION N/A 12/14/2017   Procedure: CARDIOVERSION;  Surgeon: Chilton Si, MD;  Location: [facility] ENDOSCOPY;  Service: Cardiovascular;  Laterality: N/A;   CARDIOVERSION N/A 10/01/2019   Procedure: CARDIOVERSION;  Surgeon: Jodelle Red, MD;  Location: [facility] ENDOSCOPY;  Service: Cardiovascular;  Laterality: N/A;   COLONOSCOPY     COLONOSCOPY WITH PROPOFOL N/A 11/18/2020   Procedure: COLONOSCOPY WITH PROPOFOL;  Surgeon: Benancio Deeds, MD;  Location: [facility] ENDOSCOPY;  Service: Gastroenterology;  Laterality: N/A;   CORONARY ARTERY BYPASS GRAFT  2006   ESOPHAGOGASTRODUODENOSCOPY (EGD) WITH PROPOFOL N/A 11/18/2020   Procedure: ESOPHAGOGASTRODUODENOSCOPY (EGD) WITH PROPOFOL;  Surgeon: Benancio Deeds, MD;  Location: [facility] ENDOSCOPY;  Service: Gastroenterology;  Laterality: N/A;   INTRAOPERATIVE TRANSTHORACIC ECHOCARDIOGRAM N/A 03/16/2022   Procedure: INTRAOPERATIVE TRANSTHORACIC ECHOCARDIOGRAM;  Surgeon: Tonny Bollman, MD;  Location: [facility] INVASIVE CV LAB;  Service: Open Heart Surgery;  Laterality: N/A;   LEFT ATRIAL APPENDAGE OCCLUSION N/A 12/17/2021   Procedure: LEFT ATRIAL APPENDAGE OCCLUSION;  Surgeon: Tonny Bollman, MD;  Location: [facility] INVASIVE CV LAB;  Service: Cardiovascular;  Laterality: N/A;   POLYPECTOMY     POLYPECTOMY  11/18/2020   Procedure: POLYPECTOMY;  Surgeon: Benancio Deeds, MD;  Location: [facility] ENDOSCOPY;  Service: Gastroenterology;;   RIGHT/LEFT HEART CATH AND CORONARY/GRAFT ANGIOGRAPHY N/A 02/17/2022   Procedure: RIGHT/LEFT HEART  CATH AND CORONARY/GRAFT ANGIOGRAPHY;  Surgeon: Tonny Bollman, MD;  Location: [facility] INVASIVE CV LAB;  Service: Cardiovascular;  Laterality: N/A;   TEE WITHOUT CARDIOVERSION N/A 12/17/2021   Procedure: TRANSESOPHAGEAL ECHOCARDIOGRAM (TEE);  Surgeon: Tonny Bollman, MD;  Location: [facility] INVASIVE CV LAB;  Service: Cardiovascular;  Laterality: N/A;   TRANSCATHETER AORTIC VALVE REPLACEMENT, TRANSFEMORAL Right 03/16/2022   Procedure: Transcatheter Aortic Valve Replacement, Transfemoral;  Surgeon: Tonny Bollman, MD;  Location: [facility] INVASIVE CV LAB;  Service: Open Heart Surgery;  Laterality: Right;   Family History  Problem Relation Age of Onset   Hypertension Mother    Hyperlipidemia Mother    Heart disease Father    Ulcers Father    Bipolar disorder Brother    Colon cancer Neg Hx    Colon polyps Neg Hx    Esophageal cancer Neg Hx    Rectal cancer Neg Hx    Stomach cancer Neg Hx    Social History   Tobacco Use   Smoking status: Never    Passive exposure: Never   Smokeless tobacco: Never  Vaping Use   Vaping Use: Never used  Substance Use Topics   Alcohol use: Not Currently    Comment: occasional   Drug use: No   Current Outpatient Medications  Medication Sig Dispense Refill   acetaminophen (TYLENOL) 500 MG tablet Take 1,000 mg by mouth daily as needed for moderate pain or headache.     aspirin EC 81 MG tablet Take 1 tablet (81 mg total) by mouth daily. Swallow whole. 30 tablet 12   atorvastatin (LIPITOR) 80 MG tablet TAKE 1 TABLET BY MOUTH EVERY DAY 90 tablet 1   carvedilol (COREG) 3.125 MG tablet TAKE 1 TABLET BY MOUTH TWICE A DAY WITH FOOD 60 tablet 5   [employer] VITAMIN B12 1000 MCG tablet TAKE 1 TABLET BY MOUTH EVERY DAY 180 tablet 1   empagliflozin (JARDIANCE) 10 MG TABS tablet Take 1 tablet (10 mg total) by mouth daily before breakfast. 30 tablet 11   KLOR-CON M20 20 MEQ tablet TAKE 1 TABLET BY MOUTH EVERY DAY 30 tablet 11   Misc Natural Products (LIVER PROTECT PO) Take 1 capsule by  mouth 2 (two) times daily. From [employer]     OXYGEN Inhale into the lungs.     Pirfenidone 267 MG TABS Take 3 tablets (801 mg total) by mouth 3 (three) times daily with meals. 270 tablet 5   tamsulosin (FLOMAX) 0.4 MG CAPS capsule TAKE ONE CAPSULE BY MOUTH EVERY DAY AFTER SUPPER 90 capsule 1   torsemide 60 MG TABS Take 60 mg by mouth daily. If your weight increases to 205lb or more, take an extra  of torsemide. 180 tablet 3   amoxicillin (AMOXIL) 500 MG tablet Take 4 tablets (2,000 mg total) by mouth as directed. 1 HOUR PRIOR TO DENTAL APPOINTMENTS (Patient not taking: Reported on 05/20/2022) 12 tablet 6   No current facility-administered medications for this visit.   No Known Allergies   Review of Systems: All systems reviewed and  negative except where noted in HPI.    ECHOCARDIOGRAM COMPLETE  Result Date: 04/23/2022    ECHOCARDIOGRAM REPORT   Patient Name:   [name] [facility] Date of Exam: 04/23/2022 Medical Rec #:  [id number]        Height:       66.0 in Accession #:    [id number]       Weight:       212.5 lb Date of Birth:  [birth year]        BSA:          2.052 m Patient Age:    71 years         BP:           120/57 mmHg Patient Gender: M                HR:           67 bpm. Exam Location:  [address] Procedure: 2D Echo, Cardiac Doppler and Color Doppler Indications:    Z95.2 S/p TAVR  History:        Patient has prior history of Echocardiogram examinations, most                 recent 03/17/2022. CHF, Previous Myocardial Infarction and CAD,                 S/p AVR (26mm Edwards Ultra), Arrythmias:Watchman; Risk                 Factors:Hypertension and Dyslipidemia.                 Aortic Valve: 26 mm Edwards valve is present in the aortic                 position.  Sonographer:    Samule Ohm RDCS Referring Phys: [phone] JILL D MCDANIEL IMPRESSIONS  1. Left ventricular ejection fraction, by estimation, is 50 to 55%. The left ventricle has low normal function. The left ventricle has  no regional wall motion abnormalities. The left ventricular internal cavity size was severely dilated. There is mild concentric left ventricular hypertrophy. Left ventricular diastolic parameters are consistent with Grade II diastolic dysfunction (pseudonormalization). Elevated left ventricular end-diastolic pressure.  2. Right ventricular systolic function is normal. The right ventricular size is normal.  3. Left atrial size was severely dilated.  4. Right atrial size was severely dilated.  5. The mitral valve is normal in structure. Mild mitral valve regurgitation. No evidence of mitral stenosis.  6. Since last echo, the aortic valve has been replace. Gradient across that valve is mildly elevated. Overall, the prosthesis is functioning well. The aortic valve has been repaired/replaced. Aortic valve regurgitation is trivial. No aortic stenosis is present. There is a 26 mm Edwards valve present in the aortic position. Aortic valve area, by VTI measures 1.61 cm. Aortic valve mean gradient measures 18.5 mmHg. Aortic valve Vmax measures 2.97 m/s.  7. Aortic dilatation noted. There is mild dilatation of the aortic root and of the ascending aorta, measuring 37 mm.  8. The inferior vena cava is normal in size with greater than 50% respiratory variability, suggesting right atrial pressure of 3 mmHg. FINDINGS  Left Ventricle: Left ventricular ejection fraction, by estimation, is 50 to 55%. The left ventricle has low normal function. The left ventricle has no regional wall motion abnormalities. The left ventricular internal cavity size was severely dilated. There is mild concentric left ventricular hypertrophy. Left ventricular diastolic parameters are consistent with  Grade II diastolic dysfunction (pseudonormalization). Elevated left ventricular end-diastolic pressure. Right Ventricle: The right ventricular size is normal. No increase in right ventricular wall thickness. Right ventricular systolic function is normal. Left  Atrium: Left atrial size was severely dilated. Right Atrium: Right atrial size was severely dilated. Pericardium: There is no evidence of pericardial effusion. Mitral Valve: The mitral valve is normal in structure. Mild mitral annular calcification. Mild mitral valve regurgitation. No evidence of mitral valve stenosis. Tricuspid Valve: The tricuspid valve is normal in structure. Tricuspid valve regurgitation is trivial. No evidence of tricuspid stenosis. Aortic Valve: Since last echo, the aortic valve has been replace. Gradient across that valve is mildly elevated. Overall, the prosthesis is functioning well. The aortic valve has been repaired/replaced. Aortic valve regurgitation is trivial. No aortic stenosis is present. Aortic valve mean gradient measures 18.5 mmHg. Aortic valve peak gradient measures 35.3 mmHg. Aortic valve area, by VTI measures 1.61 cm. There is a 26 mm Edwards valve present in the aortic position. Pulmonic Valve: The pulmonic valve was normal in structure. Pulmonic valve regurgitation is mild. No evidence of pulmonic stenosis. Aorta: Aortic dilatation noted. There is mild dilatation of the aortic root and of the ascending aorta, measuring 37 mm. Venous: The inferior vena cava is normal in size with greater than 50% respiratory variability, suggesting right atrial pressure of 3 mmHg. IAS/Shunts: No atrial level shunt detected by color flow Doppler.  LEFT VENTRICLE PLAX 2D LVIDd:         6.50 cm   Diastology LVIDs:         5.00 cm   LV e' medial:    7.07 cm/s LV PW:         1.20 cm   LV E/e' medial:  25.7 LV IVS:        1.30 cm   LV e' lateral:   8.70 cm/s LVOT diam:     2.20 cm   LV E/e' lateral: 20.9 LV SV:         116 LV SV Index:   57 LVOT Area:     3.80 cm  RIGHT VENTRICLE RV S prime:     10.20 cm/s TAPSE (M-mode): 1.2 cm RVSP:           26.8 mmHg LEFT ATRIUM              Index        RIGHT ATRIUM           Index LA diam:        6.00 cm  2.92 cm/m   RA Pressure: 3.00 mmHg LA Vol (A2C):    103.0 ml 50.20 ml/m  RA Area:     29.30 cm LA Vol (A4C):   92.8 ml  45.22 ml/m  RA Volume:   107.00 ml 52.14 ml/m LA Biplane Vol: 98.9 ml  48.20 ml/m  AORTIC VALVE AV Area (Vmax):    1.70 cm AV Area (Vmean):   1.65 cm AV Area (VTI):     1.61 cm AV Vmax:           297.00 cm/s AV Vmean:          200.000 cm/s AV VTI:            0.722 m AV Peak Grad:      35.3 mmHg AV Mean Grad:      18.5 mmHg LVOT Vmax:         133.00 cm/s LVOT Vmean:  86.700 cm/s LVOT VTI:          0.305 m LVOT/AV VTI ratio: 0.42  AORTA Ao Root diam: 3.70 cm Ao Asc diam:  3.70 cm MITRAL VALVE                TRICUSPID VALVE MV Area (PHT): 2.60 cm     TR Peak grad:   23.8 mmHg MV Decel Time: 292 msec     TR Vmax:        244.00 cm/s MV E velocity: 182.00 cm/s  Estimated RAP:  3.00 mmHg                             RVSP:           26.8 mmHg                              SHUNTS                             Systemic VTI:  0.30 m                             Systemic Diam: 2.20 cm Chilton Si MD Electronically signed by Chilton Si MD Signature Date/Time: 04/23/2022/2:15:58 PM    Final     Lab Results  Component Value Date   WBC 5.1 04/21/2022   HGB 11.9 (L) 04/21/2022   HCT 36.0 (L) 04/21/2022   MCV 96.0 04/21/2022   PLT 50 (L) 04/21/2022    Lab Results  Component Value Date   CREATININE 0.96 05/11/2022   BUN 14 05/11/2022   NA 146 (H) 05/11/2022   K 4.4 05/11/2022   CL 107 (H) 05/11/2022   CO2 24 05/11/2022    Lab Results  Component Value Date   ALT 25 04/21/2022   AST 32 04/21/2022   ALKPHOS 62 04/21/2022   BILITOT 3.6 (HH) 04/21/2022    Lab Results  Component Value Date   INR 1.7 (H) 03/12/2022   INR 1.9 (H) 12/17/2021   INR 1.7 (H) 05/20/2021     Physical Exam: BP (!) 130/48 (BP Location: Left Arm, Patient Position: Sitting, Cuff Size: Normal)   Pulse 72   Ht 5' 3.25" (1.607 m)   Wt 213 lb 2 oz (96.7 kg)   BMI 37.46 kg/m  Constitutional: Pleasant,well-developed, male in no acute  distress. Extremities: (+) LE edema Neurological: Alert and oriented to person place and time. No asterixis Skin: Skin is warm and dry. No rashes noted. Psychiatric: Normal mood and affect. Behavior is normal.   ASSESSMENT: 72 y.o. male here for assessment of the following  1. Cirrhosis of liver without ascites, unspecified hepatic cirrhosis type   2. Esophageal varices without bleeding, unspecified esophageal varices type   3. Gallstones    Patient unfortunately with diagnosis of cirrhosis in recent years in the setting of other significant cardiopulmonary comorbidities.  Fortunately his cirrhosis has been compensated.  He does have varices and at risk for bleeding which we discussed.  Fortunately he is no longer on any anticoagulation which will help reduce his risk.  He does have baseline significant thrombocytopenia.  His varices are being treated with Coreg 3.125 mg twice daily.  His heart rate today is in the 70s.  If he can tolerate it, he  may benefit from a higher dose of Coreg to 6.25 mg twice daily.  I can discuss with his cardiology team, Dr. Jens Som, to see if that would be possible in light of his other comorbidities.  We discussed the other alternative would be banding of varices however given his comorbidities, oxygen dependence, thrombocytopenia, I feel risks outweigh benefits of elective banding of his varices.  He understands and agrees.  Otherwise he will continue to avoid alcohol.  He is due in September for baseline labs and another right upper quadrant ultrasound in July for [facility] screening.  I otherwise counseled him on the gallstones noted on his ultrasound, he is asymptomatic, will be mindful for any biliary colic should he develop that in the future.   PLAN: - continue Coreg - will ask his cardiology team if it would be possible to increase dose to 6.25mg  BID if his HR allows. High risk for EGD for elective banding of varices - RUQ [place] July for [facility] screening - labs in  September - CBC, CMET, INR, AFP - abstain from alcohol - counseled on gallstones, asymptomatic currently - f/u 6 months  Harlin Rain, MD [facility] Gastroenterology

## 2022-05-21 ENCOUNTER — Telehealth: Payer: Self-pay | Admitting: Gastroenterology

## 2022-05-21 NOTE — Progress Notes (Deleted)
NEUROLOGY FOLLOW UP OFFICE NOTE  [name] [id number]  Assessment/Plan:   Gait instability - at this time, I do not suspect a concerning primary neurologic disorder.  It may be age-related.  He does not exhibit signs or symptoms of a neurodegenerative disease, cervical myelopathy, lumbar stenosis or polyneuropathy.  Cerebral white matter changes are believed to be cerebrovascular disease, which may contribute to balance problems (I do not suspect demyelinating disease as proposed by radiology report as a differential diagnosis   At this time, he wishes to monitor and will follow up in a year for re-evaluation.     Subjective:  [name] is a 72 year old male with nonalcoholic cirrhosis, thrombocytopenia, CAD, CHF, HTN, HLD, OSA who follows up for gait instability.  UPDATE: ***   HISTORY: Since about 2021, he had been feeling unsteady.  It is not a dizziness or vertigo.  When he is on his feet, he feels off balance.  No neck pain.  Has chronic mid low-back pain but no radiculopathy, lower extremity numbness or weakness.  Denies visual disturbance.  Some of his gait instability is related to his knee.  No change in past 2 years.  Workup by cardiology in 2021, including echo and event monitor was unremarkable.  He had an MRI of the brain without contrast on 12/22/2020 showed nonspecific cerebral white matter disease likely chronic small vessel ischemic changes but radiologist raised possibility of demyelinating disease due to prominent periventricular involvement including lesions oriented perpendicularly to the lateral ventricles.  2D echocardiogram on 12/10/2020 showed EF 55-60% with grade II diastolic dysfunction with severe calcification of the aortic valve, mild mitral valve regurgitation and no atrial level shunt.    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 Excell Seltzer and Dr. Leafy Ro   Sleep apnea    has a cpap - not using religiously    MEDICATIONS: Current Outpatient Medications on File Prior to Visit  Medication Sig Dispense Refill   acetaminophen (TYLENOL) 500 MG tablet Take 1,000 mg by mouth daily as needed for moderate pain or headache.     amoxicillin (AMOXIL) 500 MG tablet Take 4 tablets (2,000 mg total) by mouth as directed. 1 HOUR PRIOR TO DENTAL APPOINTMENTS (Patient not taking: Reported on 05/20/2022) 12 tablet 6   aspirin EC 81 MG tablet Take 1 tablet (81 mg total) by mouth daily. Swallow whole. 30 tablet 12   atorvastatin (LIPITOR) 80 MG tablet TAKE 1 TABLET BY MOUTH EVERY DAY 90 tablet 1   carvedilol (COREG) 3.125 MG tablet TAKE 1 TABLET BY MOUTH TWICE A DAY WITH FOOD 60 tablet 5   CVS VITAMIN B12 1000 MCG tablet TAKE 1 TABLET BY MOUTH EVERY DAY 180 tablet 1   empagliflozin (JARDIANCE) 10 MG TABS tablet Take 1 tablet (10  mg total) by mouth daily before breakfast. 30 tablet 11   KLOR-CON M20 20 MEQ tablet TAKE 1 TABLET BY MOUTH EVERY DAY 30 tablet 11   Misc Natural Products (LIVER PROTECT PO) Take 1 capsule by mouth 2 (two) times daily. From [employer]     OXYGEN Inhale into the lungs.     Pirfenidone 267 MG TABS Take 3 tablets (801 mg total) by mouth 3 (three) times daily with meals. 270 tablet 5   tamsulosin (FLOMAX) 0.4 MG CAPS capsule TAKE ONE CAPSULE BY MOUTH EVERY DAY AFTER SUPPER 90 capsule 1    torsemide 60 MG TABS Take 60 mg by mouth daily. If your weight increases to 205lb or more, take an extra  of torsemide. 180 tablet 3   No current facility-administered medications on file prior to visit.    ALLERGIES: No Known Allergies  FAMILY HISTORY: Family History  Problem Relation Age of Onset   Hypertension Mother    Hyperlipidemia Mother    Heart disease Father    Ulcers Father    Bipolar disorder Brother    Colon cancer Neg Hx    Colon polyps Neg Hx    Esophageal cancer Neg Hx    Rectal cancer Neg Hx    Stomach cancer Neg Hx       Objective:  *** General: No acute distress.  Patient appears ***-groomed.   Head:  Normocephalic/atraumatic Eyes:  Fundi examined but not visualized Neck: supple, no paraspinal tenderness, full range of motion Heart:  Regular rate and rhythm Lungs:  Clear to auscultation bilaterally Back: No paraspinal tenderness Neurological Exam: alert and oriented to person, place, and time.  Speech fluent and not dysarthric, language intact.  CN II-XII intact. Bulk and tone normal, muscle strength 5/5 throughout.  Sensation to light touch intact.  Deep tendon reflexes 2+ throughout, toes downgoing.  Finger to nose testing intact.  Gait normal, Romberg negative.   Shon Millet, DO  CC: ***

## 2022-05-21 NOTE — Telephone Encounter (Signed)
Jan can you let the patient know I spoke with Dr. 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 Thanks

## 2022-05-24 NOTE — Telephone Encounter (Signed)
Called pt.  Relayed recommendations.  Patient expressed understanding. ° ° ° °

## 2022-05-25 ENCOUNTER — Ambulatory Visit: Payer: 59 | Admitting: Neurology

## 2022-05-26 ENCOUNTER — Encounter: Payer: Self-pay | Admitting: Cardiovascular Disease

## 2022-05-26 ENCOUNTER — Ambulatory Visit (HOSPITAL_COMMUNITY): Payer: 59 | Attending: Cardiology

## 2022-05-26 DIAGNOSIS — E854 Organ-limited amyloidosis: Secondary | ICD-10-CM | POA: Diagnosis present

## 2022-05-26 DIAGNOSIS — Z952 Presence of prosthetic heart valve: Secondary | ICD-10-CM | POA: Diagnosis not present

## 2022-05-26 DIAGNOSIS — I43 Cardiomyopathy in diseases classified elsewhere: Secondary | ICD-10-CM | POA: Diagnosis not present

## 2022-05-26 MED ORDER — TECHNETIUM TC 99M PYROPHOSPHATE
21.0000 | Freq: Once | INTRAVENOUS | Status: AC
Start: 2022-05-26 — End: 2022-05-26
  Administered 2022-05-26: 21 via INTRAVENOUS

## 2022-05-28 ENCOUNTER — Other Ambulatory Visit: Payer: Self-pay

## 2022-05-28 ENCOUNTER — Telehealth: Payer: Self-pay | Admitting: Adult Health

## 2022-05-28 DIAGNOSIS — J849 Interstitial pulmonary disease, unspecified: Secondary | ICD-10-CM

## 2022-05-28 DIAGNOSIS — J9611 Chronic respiratory failure with hypoxia: Secondary | ICD-10-CM

## 2022-05-28 NOTE — Telephone Encounter (Signed)
Order has been placed to D/C o2 and POC. Significant other is aware. NFN

## 2022-05-28 NOTE — Telephone Encounter (Signed)
Patient's significant other called to request that a cancellation order be sent to [employer] to cancel the order for the POC.  They need them to pick up the machine and they will need a order to do so.  Please advise.  The fax# is [phone].

## 2022-05-28 NOTE — Telephone Encounter (Signed)
Per DPR spoke with patients significant other. She states order needs to be sent to  [employer] to D/C current o2 and POC because patient has changed companies to [employer] in [place]- pt has already received POC machine and o2 tanks from them.   Tammy are you okay with [place] placing orders for [employer] to pick up o2 equipment

## 2022-05-28 NOTE — Telephone Encounter (Signed)
That is fine thank you .  

## 2022-06-02 ENCOUNTER — Telehealth: Payer: Self-pay | Admitting: Adult Health

## 2022-06-02 NOTE — Telephone Encounter (Signed)
Spoke to patient advised order to D/C o2 and POC has been received by [employer] and should be processed over the next week. Pt verbalized understanding. NFN

## 2022-06-02 NOTE — Progress Notes (Signed)
HPI: FU CAD, CHF, H/O TAVR and atrial flutter/fibrillation; history of coronary artery disease, status post coronary artery bypass graft surgery performed in June 2006. Patient had a LIMA to the LAD, saphenous vein graft to the diagonal, saphenous vein graft to the acute marginal and saphenous vein graft to the PDA. Carotid Dopplers August 2015 showed no significant stenosis. Patient also with h/o atrial fibrillation/atrial flutter. Abdominal ultrasound June 2022 showed no aneurysm. 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 Cardiac catheterization January 2024 revealed moderate left main stenosis, occluded LAD, patent left circumflex and moderate stenosis in the right coronary artery.  PA pressure 57/9 and left ventricular end-diastolic pressure 14.  The LIMA to the LAD and saphenous vein graft to the first diagonal patent; saphenous vein graft to OM and saphenous vein graft to the PDA occluded.  Had TAVR March 16, 2022 with 26 mm Edwards ultra stented valve.   Note postprocedure course complicated by volume excess and hypoxemia.  Echocardiogram 3/24 showed normal LV function, mild left ventricular hypertrophy, grade 2 diastolic dysfunction, severe biatrial enlargement, mild mitral regurgitation, status post aortic valve replacement with mean gradient 18.5 mmHg and trace aortic insufficiency.  PYP scan April 2024 not suggestive of amyloidosis.  Since last seen he does have dyspnea on exertion.  He also has worsening lower extremity edema but he is not taking his Demadex routinely when he is at the office.  There is no chest pain, palpitations, syncope.  Current Outpatient Medications  Medication Sig Dispense Refill   acetaminophen (TYLENOL) 500 MG tablet Take 1,000 mg by mouth daily as needed for moderate pain or headache.     amoxicillin (AMOXIL) 500 MG tablet Take 4  tablets (2,000 mg total) by mouth as directed. 1 HOUR PRIOR TO DENTAL APPOINTMENTS 12 tablet 6   aspirin EC 81 MG tablet Take 1 tablet (81 mg total) by mouth daily. Swallow whole. 30 tablet 12   atorvastatin (LIPITOR) 80 MG tablet TAKE 1 TABLET BY MOUTH EVERY DAY 90 tablet 1   carvedilol (COREG) 3.125 MG tablet TAKE 1 TABLET BY MOUTH TWICE A DAY WITH FOOD 60 tablet 5   CVS VITAMIN B12 1000 MCG tablet TAKE 1 TABLET BY MOUTH EVERY DAY 180 tablet 1   empagliflozin (JARDIANCE) 10 MG TABS tablet Take 1 tablet (10 mg total) by mouth daily before breakfast. 30 tablet 11   KLOR-CON M20 20 MEQ tablet TAKE 1 TABLET BY MOUTH EVERY DAY 30 tablet 11   Misc Natural Products (LIVER PROTECT PO) Take 1 capsule by mouth 2 (two) times daily. From [employer]     OXYGEN Inhale into the lungs.     Pirfenidone 267 MG TABS Take 3 tablets (801 mg total) by mouth 3 (three) times daily with meals. 270 tablet 5   tamsulosin (FLOMAX) 0.4 MG CAPS capsule TAKE ONE CAPSULE BY MOUTH EVERY DAY AFTER SUPPER 90 capsule 1   torsemide 60 MG TABS Take 60 mg by mouth daily. If your weight increases to 205lb or more, take an extra 60mg  of torsemide. 180 tablet 3   No current facility-administered medications for this visit.     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 Excell Seltzer and Dr.  Leafy Ro   Sleep apnea    has a cpap - not using religiously    Past Surgical History:  Procedure Laterality Date   BIOPSY  11/18/2020   Procedure: BIOPSY;  Surgeon: Benancio Deeds, MD;  Location: [facility]COPY;  Service: Gastroenterology;;   CARDIOVERSION N/A 12/14/2017   Procedure: CARDIOVERSION;  Surgeon: Chilton Si, MD;  Location: [facility] ENDOSCOPY;  Service: Cardiovascular;  Laterality: N/A;   CARDIOVERSION N/A 10/01/2019   Procedure: CARDIOVERSION;  Surgeon: Jodelle Red, MD;  Location: [facility] ENDOSCOPY;  Service: Cardiovascular;  Laterality: N/A;   COLONOSCOPY     COLONOSCOPY WITH PROPOFOL N/A 11/18/2020   Procedure: COLONOSCOPY WITH PROPOFOL;  Surgeon: Benancio Deeds, MD;  Location: [facility] ENDOSCOPY;  Service: Gastroenterology;  Laterality: N/A;   CORONARY ARTERY BYPASS GRAFT  2006   ESOPHAGOGASTRODUODENOSCOPY (EGD) WITH PROPOFOL N/A 11/18/2020   Procedure: ESOPHAGOGASTRODUODENOSCOPY (EGD) WITH PROPOFOL;  Surgeon: Benancio Deeds, MD;  Location: [facility] ENDOSCOPY;  Service: Gastroenterology;  Laterality: N/A;   INTRAOPERATIVE TRANSTHORACIC ECHOCARDIOGRAM N/A 03/16/2022   Procedure: INTRAOPERATIVE TRANSTHORACIC ECHOCARDIOGRAM;  Surgeon: Tonny Bollman, MD;  Location: [facility] INVASIVE CV LAB;  Service: Open Heart Surgery;  Laterality: N/A;   LEFT ATRIAL APPENDAGE OCCLUSION N/A 12/17/2021   Procedure: LEFT ATRIAL APPENDAGE OCCLUSION;  Surgeon: Tonny Bollman, MD;  Location: [facility] INVASIVE CV LAB;  Service: Cardiovascular;  Laterality: N/A;   POLYPECTOMY     POLYPECTOMY  11/18/2020   Procedure: POLYPECTOMY;  Surgeon: Benancio Deeds, MD;  Location: [facility] ENDOSCOPY;  Service: Gastroenterology;;   RIGHT/LEFT HEART CATH AND CORONARY/GRAFT ANGIOGRAPHY N/A 02/17/2022   Procedure: RIGHT/LEFT HEART CATH AND CORONARY/GRAFT ANGIOGRAPHY;  Surgeon: Tonny Bollman, MD;  Location: [facility] INVASIVE CV LAB;  Service: Cardiovascular;  Laterality: N/A;   TEE WITHOUT CARDIOVERSION N/A 12/17/2021    Procedure: TRANSESOPHAGEAL ECHOCARDIOGRAM (TEE);  Surgeon: Tonny Bollman, MD;  Location: [facility] INVASIVE CV LAB;  Service: Cardiovascular;  Laterality: N/A;   TRANSCATHETER AORTIC VALVE REPLACEMENT, TRANSFEMORAL Right 03/16/2022   Procedure: Transcatheter Aortic Valve Replacement, Transfemoral;  Surgeon: Tonny Bollman, MD;  Location: [facility] INVASIVE CV LAB;  Service: Open Heart Surgery;  Laterality: Right;    Social History   Socioeconomic History   Marital status: Significant Other    Spouse name: Not on file   Number of children: 0   Years of education: Not on file   Highest education level: Not on file  Occupational History   Occupation: Barrister's clerk  Tobacco Use   Smoking status: Never    Passive exposure: Never   Smokeless tobacco: Never  Vaping Use   Vaping Use: Never used  Substance and Sexual Activity   Alcohol use: Not Currently    Comment: occasional   Drug use: No   Sexual activity: Not on file  Other Topics Concern   Not on file  Social History Narrative   Not on file   Social Determinants of Health   Financial Resource Strain: Not on file  Food Insecurity: No Food Insecurity (12/17/2021)  Hunger Vital Sign    Worried About Running Out of Food in the Last Year: Never true    Ran Out of Food in the Last Year: Never true  Transportation Needs: No Transportation Needs (12/17/2021)   PRAPARE - Administrator, Civil Service (Medical): No    Lack of Transportation (Non-Medical): No  Physical Activity: Not on file  Stress: Not on file  Social Connections: Not on file  Intimate Partner Violence: Not At Risk (12/17/2021)   Humiliation, Afraid, Rape, and Kick questionnaire    Fear of Current or Ex-Partner: No    Emotionally Abused: No    Physically Abused: No    Sexually Abused: No    Family History  Problem Relation Age of Onset   Hypertension Mother    Hyperlipidemia Mother    Heart disease Father    Ulcers Father    Bipolar  disorder Brother    Colon cancer Neg Hx    Colon polyps Neg Hx    Esophageal cancer Neg Hx    Rectal cancer Neg Hx    Stomach cancer Neg Hx     ROS: no fevers or chills, productive cough, hemoptysis, dysphasia, odynophagia, melena, hematochezia, dysuria, hematuria, rash, seizure activity, orthopnea, PND, pedal edema, claudication. Remaining systems are negative.  Physical Exam: Well-developed chronically ill appearing Skin is warm and dry.  HEENT is normal.  Neck is supple.  Chest is clear to auscultation with normal expansion.  Cardiovascular exam is regular rate and rhythm.  2/6 systolic murmur left sternal border. Abdominal exam nontender or distended. No masses palpated. Extremities show 3+ edema. neuro grossly intact  A/P  1 chronic diastolic congestive heart failure-lower extremity edema is worse but he has missed some of his Demadex (he does not take this occasionally when he goes to the office).  I will ask him to take this routinely.  We will continue to adjust diuretic dose if needed.  2 status post TAVR-continue SBE prophylaxis.  Most recent echocardiogram showed normally functioning valve.  3 paroxysmal afibrillation-patient remains in sinus rhythm on exam.  Status post watchman and therefore not anticoagulated.  Continue aspirin.  4 coronary artery disease status post coronary bypass and graft-continue aspirin and statin.  5 pulmonary fibrosis-clearly contributing to hypoxemia.  Managed by pulmonary.  6 nonalcoholic cirrhosis with splenomegaly, portal venous hypertension/thrombocytopenia-followed by primary care.  Olga Millers, MD

## 2022-06-02 NOTE — Telephone Encounter (Signed)
Patient would like the office to fax [employer] a discontinuation letter for the oxygen equipment so that they can pick it up from the patient's home.  Please advise and contact patient if there are any other questions.  CB# [phone]

## 2022-06-07 ENCOUNTER — Telehealth (HOSPITAL_COMMUNITY): Payer: Self-pay | Admitting: *Deleted

## 2022-06-07 NOTE — Telephone Encounter (Signed)
Cardiac Rehab Medication Review by a Nurse   Does the patient  feel that his/her medications are working for him/her?  yes   Has the patient been experiencing any side effects to the medications prescribed?  no   Does the patient measure his/her own blood pressure or blood glucose at home?  yes    Does the patient have any problems obtaining medications due to transportation or finances?   no   Understanding of regimen: good Understanding of indications: good Potential of compliance: good     Verified medication via phone with patient. Nurse comments:

## 2022-06-09 ENCOUNTER — Encounter: Payer: 59 | Admitting: Internal Medicine

## 2022-06-09 ENCOUNTER — Ambulatory Visit: Payer: 59 | Attending: Cardiology | Admitting: Cardiology

## 2022-06-09 ENCOUNTER — Encounter: Payer: Self-pay | Admitting: Cardiology

## 2022-06-09 VITALS — BP 140/56 | HR 88 | Ht 66.0 in | Wt 207.6 lb

## 2022-06-09 DIAGNOSIS — I5032 Chronic diastolic (congestive) heart failure: Secondary | ICD-10-CM | POA: Diagnosis not present

## 2022-06-09 DIAGNOSIS — I2510 Atherosclerotic heart disease of native coronary artery without angina pectoris: Secondary | ICD-10-CM | POA: Diagnosis not present

## 2022-06-09 DIAGNOSIS — J849 Interstitial pulmonary disease, unspecified: Secondary | ICD-10-CM

## 2022-06-09 DIAGNOSIS — Z952 Presence of prosthetic heart valve: Secondary | ICD-10-CM

## 2022-06-09 DIAGNOSIS — I480 Paroxysmal atrial fibrillation: Secondary | ICD-10-CM

## 2022-06-09 NOTE — Patient Instructions (Signed)
    Follow-Up: At [facility], you and your health needs are our priority.  As part of our continuing mission to provide you with exceptional heart care, we have created designated Provider Care Teams.  These Care Teams include your primary Cardiologist (physician) and Advanced Practice Providers (APPs -  Physician Assistants and Nurse Practitioners) who all work together to provide you with the care you need, when you need it.  We recommend signing up for the patient portal called "MyChart".  Sign up information is provided on this After Visit Summary.  MyChart is used to connect with patients for Virtual Visits (Telemedicine).  Patients are able to view lab/test results, encounter notes, upcoming appointments, etc.  Non-urgent messages can be sent to your provider as well.   To learn more about what you can do with MyChart, go to [email or web address].    Your next appointment:   3 month(s)  Provider:   Brian Crenshaw, MD     

## 2022-06-10 ENCOUNTER — Encounter (HOSPITAL_COMMUNITY): Payer: Self-pay

## 2022-06-10 ENCOUNTER — Encounter (HOSPITAL_COMMUNITY)
Admission: RE | Admit: 2022-06-10 | Discharge: 2022-06-10 | Disposition: A | Discharge status: Home / Self Care | Payer: 59 | Source: Ambulatory Visit | Attending: Pulmonary Disease | Admitting: Pulmonary Disease

## 2022-06-10 ENCOUNTER — Ambulatory Visit: Payer: 59 | Admitting: Student

## 2022-06-10 VITALS — BP 132/42 | HR 63 | Ht 66.0 in | Wt 209.7 lb

## 2022-06-10 DIAGNOSIS — J849 Interstitial pulmonary disease, unspecified: Secondary | ICD-10-CM | POA: Insufficient documentation

## 2022-06-10 LAB — GLUCOSE, CAPILLARY: Glucose-Capillary: 128 mg/dL — ABNORMAL HIGH (ref 70–99)

## 2022-06-10 NOTE — Progress Notes (Addendum)
Pulmonary Individual Treatment Plan  Patient Details  Name: [name] MRN: [id number] Date of Birth: 1950-08-05 Referring Provider:   Flowsheet Row PULMONARY REHAB OTHER RESP ORIENTATION from 06/10/2022 in [facility] CARDIAC REHABILITATION  Referring Provider Dr. Isaiah Serge       Initial Encounter Date:  Flowsheet Row PULMONARY REHAB OTHER RESP ORIENTATION from 06/10/2022 in [facility] CARDIAC REHABILITATION  Date 06/10/22       Visit Diagnosis: ILD (interstitial lung disease) (HCC)  Patient's Home Medications on Admission:   Current Outpatient Medications:    acetaminophen (TYLENOL) 500 MG tablet, Take 1,000 mg by mouth daily as needed for moderate pain or headache., Disp: , Rfl:    amoxicillin (AMOXIL) 500 MG tablet, Take 4 tablets (2,000 mg total) by mouth as directed. 1 HOUR PRIOR TO DENTAL APPOINTMENTS, Disp: 12 tablet, Rfl: 6   aspirin EC 81 MG tablet, Take 1 tablet (81 mg total) by mouth daily. Swallow whole., Disp: 30 tablet, Rfl: 12   atorvastatin (LIPITOR) 80 MG tablet, TAKE 1 TABLET BY MOUTH EVERY DAY, Disp: 90 tablet, Rfl: 1   carvedilol (COREG) 3.125 MG tablet, TAKE 1 TABLET BY MOUTH TWICE A DAY WITH FOOD, Disp: 60 tablet, Rfl: 5   [employer] VITAMIN B12 1000 MCG tablet, TAKE 1 TABLET BY MOUTH EVERY DAY, Disp: 180 tablet, Rfl: 1   empagliflozin (JARDIANCE) 10 MG TABS tablet, Take 1 tablet (10 mg total) by mouth daily before breakfast., Disp: 30 tablet, Rfl: 11   KLOR-CON M20 20 MEQ tablet, TAKE 1 TABLET BY MOUTH EVERY DAY, Disp: 30 tablet, Rfl: 11   Misc Natural Products (LIVER PROTECT PO), Take 1 capsule by mouth 2 (two) times daily. From [employer], Disp: , Rfl:    OXYGEN, Inhale into the lungs., Disp: , Rfl:    Pirfenidone 267 MG TABS, Take 3 tablets (801 mg total) by mouth 3 (three) times daily with meals., Disp: 270 tablet, Rfl: 5   tamsulosin (FLOMAX) 0.4 MG CAPS capsule, TAKE ONE CAPSULE BY MOUTH EVERY DAY AFTER SUPPER, Disp: 90 capsule, Rfl: 1   torsemide 60 MG  TABS, Take 60 mg by mouth daily. 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 Excell Seltzer and Dr. Leafy Ro   Sleep apnea    has a cpap - not using religiously    Tobacco Use: Social History   Tobacco Use  Smoking Status Never   Passive exposure: Never  Smokeless Tobacco Never    Labs: Review Flowsheet  More data exists      Latest Ref Rng & Units 11/30/2019 02/12/2021 08/10/2021 02/17/2022 03/16/2022  Labs for ITP Cardiac and Pulmonary Rehab  Cholestrol 0 - 200 mg/dL 132  440  - - -  LDL (calc) 0 - 99 mg/dL 53  65  - - -  HDL-C >16.10 mg/dL 44  96.04  - - -  Trlycerides 0.0 - 149.0 mg/dL 68  54.0  - - -  Hemoglobin A1c 4.0 - 5.6 % 5.8  5.9  5.8  - -  PH, Arterial 7.35 - 7.45 - - - 7.443  7.373   PCO2 arterial 32 - 48 mmHg - - - 30.4  36.7   Bicarbonate 20.0 - 28.0 mmol/L - - - 20.8  22.5  21.4   TCO2 22 - 32 mmol/L - - - 22  23  19  22  20    Acid-base deficit 0.0 - 2.0 mmol/L - - - 3.0  1.0  3.0   O2 Saturation % - - - 94  79  89     Capillary Blood Glucose: Lab Results  Component Value Date   GLUCAP 128 (H) 06/10/2022     Pulmonary Assessment Scores:  Pulmonary Assessment Scores     Row Name 06/10/22 0950          ADL UCSD   ADL Phase Entry     SOB Score total 61     Walk 2     Stairs 1     Bath 1     Dress 2     Shop 2       CAT Score   CAT Score 25       mMRC Score   mMRC Score 3             UCSD: Self-administered rating of dyspnea associated with activities of daily living (ADLs) 6-point scale (0 = "not at all" to 5 = "maximal or unable to do because of breathlessness")  Scoring Scores range from 0 to 120.  Minimally important difference is 5 units  CAT: CAT can identify the health impairment of COPD patients and is better correlated with disease progression.  CAT has a scoring range of zero to 40. The CAT score is classified into four groups of low (less than 10), medium (10 - 20), high (21-30) and very high (31-40) based on the impact level of disease on health status. A CAT score over 10 suggests significant symptoms.  A worsening CAT score could be explained by an exacerbation, poor medication adherence, poor inhaler technique, or progression of COPD or comorbid conditions.  CAT MCID is 2 points  mMRC: mMRC (Modified Medical Research Council) Dyspnea Scale is used to assess the degree of baseline functional disability in patients of respiratory disease due to dyspnea. No minimal important difference is established. A decrease in score of 1 point or greater is considered a positive change.   Pulmonary Function Assessment:   Exercise Target Goals: Exercise Program Goal: Individual exercise prescription set using results from initial 6 min walk test and THRR while considering  patient's activity barriers and safety.   Exercise Prescription Goal: Initial exercise prescription builds to 30-45 minutes a day of aerobic activity, 2-3 days per week.  Home exercise guidelines will be given to patient during program as part of exercise prescription that the participant will acknowledge.  Activity Barriers & Risk Stratification:  Activity Barriers & Cardiac Risk Stratification -  06/10/22 0855       Activity Barriers & Cardiac Risk Stratification   Activity Barriers Back Problems;Deconditioning;Shortness of Breath;Assistive Device;Balance Concerns    Cardiac Risk Stratification High             6 Minute Walk:  6 Minute Walk     Row Name 06/10/22 1023         6 Minute Walk   Phase Initial     Distance 450 feet     Walk Time 6 minutes     # of Rest Breaks 2     MPH 0.85     METS 0.86     RPE 13     Perceived Dyspnea  14     VO2 Peak 3.03     Symptoms Yes (comment)     Comments pt needed two breaks due to SOB and oxygen levels dropping (79-80)     Resting HR 63 bpm     Resting BP 132/42     Resting Oxygen Saturation  92 %     Exercise Oxygen Saturation  during 6 min walk 78 %     Max Ex. HR 80 bpm     Max Ex. BP 152/60     2 Minute Post BP 140/58       Interval HR   1 Minute HR 81     2 Minute HR 80     3 Minute HR 79     4 Minute HR 80     5 Minute HR 80     6 Minute HR 79     2 Minute Post HR 74     Interval Heart Rate? Yes       Interval Oxygen   Interval Oxygen? Yes     Baseline Oxygen Saturation % 92 %     1 Minute Oxygen Saturation % 88 %     1 Minute Liters of Oxygen 8 L     2 Minute Oxygen Saturation % 78 %     2 Minute Liters of Oxygen 8 L     3 Minute Oxygen Saturation % 81 %     3 Minute Liters of Oxygen 8 L     4 Minute Oxygen Saturation % 88 %     4 Minute Liters of Oxygen 8 L     5 Minute Oxygen Saturation % 81 %     5 Minute Liters of Oxygen 8 L     6 Minute Oxygen Saturation % 79 %     6 Minute Liters of Oxygen 8 L     2 Minute Post Oxygen Saturation % 86 %     2 Minute Post Liters of Oxygen 8 L              Oxygen Initial Assessment:  Oxygen Initial Assessment - 06/10/22 1031       Home Oxygen   Home Oxygen Device Home Concentrator;E-Tanks    Sleep Oxygen Prescription Continuous    Liters per minute 4    Home Resting Oxygen Prescription Continuous    Liters per minute 4    Compliance with Home  Oxygen Use Yes      Initial 6 min Walk   Oxygen Used Continuous    Liters per minute 8      Program Oxygen Prescription   Program Oxygen Prescription Continuous    Liters per minute 6    Comments pt will start at 6 Liters and if need go to 8 liters      Intervention   Short Term Goals To learn and exhibit compliance with exercise, home and travel O2 prescription;To learn and understand importance of monitoring SPO2 with pulse oximeter and demonstrate accurate use of the pulse oximeter.;To learn  and understand importance of maintaining oxygen saturations>88%;To learn and demonstrate proper pursed lip breathing techniques or other breathing techniques.     Long  Term Goals Exhibits compliance with exercise, home  and travel O2 prescription;Maintenance of O2 saturations>88%;Verbalizes importance of monitoring SPO2 with pulse oximeter and return demonstration;Exhibits proper breathing techniques, such as pursed lip breathing or other method taught during program session             Oxygen Re-Evaluation:   Oxygen Discharge (Final Oxygen Re-Evaluation):   Initial Exercise Prescription:  Initial Exercise Prescription - 06/10/22 1000       Date of Initial Exercise RX and Referring Provider   Date 06/10/22    Referring Provider Dr. Isaiah Serge    Expected Discharge Date 09/09/22      Oxygen   Oxygen Continuous    Liters 6    Maintain Oxygen Saturation 88% or higher      NuStep   Level 1    SPM 60    Minutes 39      Prescription Details   Frequency (times per week) 2    Duration Progress to 30 minutes of continuous aerobic without signs/symptoms of physical distress      Intensity   THRR 40-80% of Max Heartrate 58-118    Ratings of Perceived Exertion 11-13    Perceived Dyspnea 0-4      Resistance Training   Training Prescription Yes    Weight 2    Reps 10-15             Perform Capillary Blood Glucose checks as needed.  Exercise Prescription Changes:   Exercise  Comments:   Exercise Goals and Review:   Exercise Goals     Row Name 06/10/22 1030             Exercise Goals   Increase Physical Activity Yes       Intervention Provide advice, education, support and counseling about physical activity/exercise needs.;Develop an individualized exercise prescription for aerobic and resistive training based on initial evaluation findings, risk stratification, comorbidities and participant's personal goals.       Expected Outcomes Long Term: Add in home exercise to make exercise part of routine and to increase amount of physical activity.;Short Term: Attend rehab on a regular basis to increase amount of physical activity.;Long Term: Exercising regularly at least 3-5 days a week.       Increase Strength and Stamina Yes       Intervention Provide advice, education, support and counseling about physical activity/exercise needs.;Develop an individualized exercise prescription for aerobic and resistive training based on initial evaluation findings, risk stratification, comorbidities and participant's personal goals.       Expected Outcomes Short Term: Increase workloads from initial exercise prescription for resistance, speed, and METs.;Short Term: Perform resistance training exercises routinely during rehab and add in resistance training at home;Long Term: Improve cardiorespiratory fitness, muscular endurance and strength as measured by increased METs and functional capacity ( )       Able to understand and use rate of perceived exertion (RPE) scale Yes       Intervention Provide education and explanation on how to use RPE scale       Expected Outcomes Short Term: Able to use RPE daily in rehab to express subjective intensity level;Long Term:  Able to use RPE to guide intensity level when exercising independently       Able to understand and use Dyspnea scale Yes       Intervention  Provide education and explanation on how to use Dyspnea scale       Expected  Outcomes Short Term: Able to use Dyspnea scale daily in rehab to express subjective sense of shortness of breath during exertion;Long Term: Able to use Dyspnea scale to guide intensity level when exercising independently       Knowledge and understanding of Target Heart Rate Range (THRR) Yes       Intervention Provide education and explanation of THRR including how the numbers were predicted and where they are located for reference       Expected Outcomes Short Term: Able to state/look up THRR;Long Term: Able to use THRR to govern intensity when exercising independently;Short Term: Able to use daily as guideline for intensity in rehab       Understanding of Exercise Prescription Yes       Intervention Provide education, explanation, and written materials on patient's individual exercise prescription       Expected Outcomes Short Term: Able to explain program exercise prescription;Long Term: Able to explain home exercise prescription to exercise independently                Exercise Goals Re-Evaluation :   Discharge Exercise Prescription (Final Exercise Prescription Changes):   Nutrition:  Target Goals: Understanding of nutrition guidelines, daily intake of sodium 1500mg , cholesterol 200mg , calories 30% from fat and 7% or less from saturated fats, daily to have 5 or more servings of fruits and vegetables.  Biometrics:  Pre Biometrics - 06/10/22 1031       Pre Biometrics   Height 5\' 6"  (1.676 m)    Weight 95.1 kg    Waist Circumference 41 inches    Hip Circumference 45 inches    Waist to Hip Ratio 0.91 %    BMI (Calculated) 33.86    Triceps Skinfold 10 mm    % Body Fat 29.1 %    Grip Strength 27.3 kg    Flexibility 0 in    Single Leg Stand 0 seconds              Nutrition Therapy Plan and Nutrition Goals:  Nutrition Therapy & Goals - 06/10/22 0952       Personal Nutrition Goals   Comments Patient scored 63 on his diet assessment. Handout explained and provided  regarding healthier choices. We provide educational information on heart healthy nutrition with handouts and assistance with RD referral if patient is interested.      Intervention Plan   Intervention Nutrition handout(s) given to patient.    Expected Outcomes Short Term Goal: Understand basic principles of dietary content, such as calories, fat, sodium, cholesterol and nutrients.             Nutrition Assessments:  Nutrition Assessments - 06/10/22 0952       MEDFICTS Scores   Pre Score 63            MEDIFICTS Score Key: ?70 Need to make dietary changes  40-70 Heart Healthy Diet ? 40 Therapeutic Level Cholesterol Diet   Picture Your Plate Scores: <82 Unhealthy dietary pattern with much room for improvement. 41-50 Dietary pattern unlikely to meet recommendations for good health and room for improvement. 51-60 More healthful dietary pattern, with some room for improvement.  >60 Healthy dietary pattern, although there may be some specific behaviors that could be improved.    Nutrition Goals Re-Evaluation:   Nutrition Goals Discharge (Final Nutrition Goals Re-Evaluation):   Psychosocial: Target Goals: Acknowledge presence or absence  of significant depression and/or stress, maximize coping skills, provide positive support system. Participant is able to verbalize types and ability to use techniques and skills needed for reducing stress and depression.  Initial Review & Psychosocial Screening:  Initial Psych Review & Screening - 06/10/22 0957       Initial Review   Current issues with None Identified      Family Dynamics   Good Support System? Yes      Barriers   Psychosocial barriers to participate in program There are no identifiable barriers or psychosocial needs.      Screening Interventions   Interventions Encouraged to exercise;To provide support and resources with identified psychosocial needs;Provide feedback about the scores to participant    Expected  Outcomes Short Term goal: Utilizing psychosocial counselor, staff and physician to assist with identification of specific Stressors or current issues interfering with healing process. Setting desired goal for each stressor or current issue identified.             Quality of Life Scores:  Quality of Life - 06/10/22 1155       Quality of Life   Select Quality of Life      Quality of Life Scores   Health/Function Pre 14.06 %    Socioeconomic Pre 22.19 %    Psych/Spiritual Pre 18.93 %    Family Pre 25 %    GLOBAL Pre 18.33 %            Scores of 19 and below usually indicate a poorer quality of life in these areas.  A difference of  2-3 points is a clinically meaningful difference.  A difference of 2-3 points in the total score of the Quality of Life Index has been associated with significant improvement in overall quality of life, self-image, physical symptoms, and general health in studies assessing change in quality of life.   PHQ-9: Review Flowsheet  More data exists      06/10/2022 02/12/2021 11/30/2019 11/29/2018 04/11/2018  Depression screen PHQ 2/9  Decreased Interest 0 0 0 0 0  Down, Depressed, Hopeless 0 0 0 0 0  PHQ - 2 Score 0 0 0 0 0  Altered sleeping 1 - 0 0 -  Tired, decreased energy 1 - 0 0 -  Change in appetite 1 - 0 0 -  Feeling bad or failure about yourself  0 - 0 0 -  Trouble concentrating 0 - 0 0 -  Moving slowly or fidgety/restless 0 - 0 0 -  Suicidal thoughts 0 - 0 0 -  PHQ-9 Score 3 - 0 0 -  Difficult doing work/chores Not difficult at all - Not difficult at all Not difficult at all -   Interpretation of Total Score  Total Score Depression Severity:  1-4 = Minimal depression, 5-9 = Mild depression, 10-14 = Moderate depression, 15-19 = Moderately severe depression, 20-27 = Severe depression   Psychosocial Evaluation and Intervention:  Psychosocial Evaluation - 06/10/22 0958       Psychosocial Evaluation & Interventions   Interventions Stress  management education;Relaxation education;Encouraged to exercise with the program and follow exercise prescription    Comments Patient has no psychosocial barriers or issues identified at his orientation visit. He is accompained by his wife today both very pleasant. 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 He says most of his work is remote.  He looks at and approves building plans. He says he plans to retire in the next year but does enjoy his work. He says he has a good support system with his wife of many years and he has a lot of friends that support him. He loves to play golf when he can. He started using oxygen in [place] after being hospitalized with dyspnea which led to a TAVR. He says he is hopeful the program will help him get stronger; improve his walking and breathing and he can get back to his normal activities. He is ready to start the program.    Expected Outcomes Patient will continue to have no psychosocial barriers identified.    Continue Psychosocial Services  No Follow up required             Psychosocial Re-Evaluation:   Psychosocial Discharge (Final Psychosocial Re-Evaluation):    Education: Education Goals: Education classes will be provided on a weekly basis, covering required topics. Participant will state understanding/return demonstration of topics presented.  Learning Barriers/Preferences:  Learning Barriers/Preferences - 06/10/22 0953       Learning Barriers/Preferences   Learning Barriers None    Learning Preferences Audio             Education Topics: How Lungs Work and Diseases: - Discuss the anatomy of the lungs and diseases that can affect the lungs, such as COPD.   Exercise: -Discuss the importance of exercise, FITT principles of exercise, normal and abnormal responses to exercise, and how to exercise  safely.   Environmental Irritants: -Discuss types of environmental irritants and how to limit exposure to environmental irritants.   Meds/Inhalers and oxygen: - Discuss respiratory medications, definition of an inhaler and oxygen, and the proper way to use an inhaler and oxygen.   Energy Saving Techniques: - Discuss methods to conserve energy and decrease shortness of breath when performing activities of daily living.    Bronchial Hygiene / Breathing Techniques: - Discuss breathing mechanics, pursed-lip breathing technique,  proper posture, effective ways to clear airways, and other functional breathing techniques   Cleaning Equipment: - Provides group verbal and written instruction about the health risks of elevated stress, cause of high stress, and healthy ways to reduce stress.   Nutrition I: Fats: - Discuss the types of cholesterol, what cholesterol does to the body, and how cholesterol levels can be controlled.   Nutrition II: Labels: -Discuss the different components of food labels and how to read food labels.   Respiratory Infections: - Discuss the signs and symptoms of respiratory infections, ways to prevent respiratory infections, and the importance of seeking medical treatment when having a respiratory infection.   Stress I: Signs and Symptoms: - Discuss the causes of stress, how stress may lead to anxiety and depression, and ways to limit stress.   Stress II: Relaxation: -Discuss relaxation techniques to limit stress.   Oxygen for Home/Travel: - Discuss how to prepare for travel when on oxygen and proper ways to transport and store oxygen to ensure safety.   Knowledge Questionnaire Score:  Knowledge Questionnaire Score - 06/10/22 0953       Knowledge Questionnaire Score   Pre Score 15/18             Core Components/Risk Factors/Patient Goals at Admission:  Personal Goals and Risk Factors at Admission - 06/10/22 0954       Core Components/Risk  Factors/Patient Goals on Admission    Weight Management Obesity    Improve shortness of breath with  ADL's Yes    Intervention Provide education, individualized exercise plan and daily activity instruction to help decrease symptoms of SOB with activities of daily living.    Expected Outcomes Short Term: Improve cardiorespiratory fitness to achieve a reduction of symptoms when performing ADLs;Long Term: Be able to perform more ADLs without symptoms or delay the onset of symptoms    Increase knowledge of respiratory medications and ability to use respiratory devices properly  Yes    Intervention Provide education and demonstration as needed of appropriate use of medications, inhalers, and oxygen therapy.    Expected Outcomes Short Term: Achieves understanding of medications use. Understands that oxygen is a medication prescribed by physician. Demonstrates appropriate use of inhaler and oxygen therapy.;Long Term: Maintain appropriate use of medications, inhalers, and oxygen therapy.    Heart Failure Yes    Intervention Provide a combined exercise and nutrition program that is supplemented with education, support and counseling about heart failure. Directed toward relieving symptoms such as shortness of breath, decreased exercise tolerance, and extremity edema.    Expected Outcomes Short term: Attendance in program 2-3 days a week with increased exercise capacity. Reported lower sodium intake. Reported increased fruit and vegetable intake. Reports medication compliance.    Hypertension Yes    Intervention Provide education on lifestyle modifcations including regular physical activity/exercise, weight management, moderate sodium restriction and increased consumption of fresh fruit, vegetables, and low fat dairy, alcohol moderation, and smoking cessation.;Monitor prescription use compliance.    Expected Outcomes Short Term: Continued assessment and intervention until BP is < 140/29mm HG in hypertensive  participants. < 130/75mm HG in hypertensive participants with diabetes, heart failure or chronic kidney disease.;Long Term: Maintenance of blood pressure at goal levels.    Personal Goal Other Yes    Personal Goal Patient wants to breathe better and get back to his normal activities.    Intervention Patient will attend PR 2 days/week with exercise and education.    Expected Outcomes Patient will complete the program meeting both personal and program goals.             Core Components/Risk Factors/Patient Goals Review:    Core Components/Risk Factors/Patient Goals at Discharge (Final Review):    ITP Comments:   Comments: Patient arrived for 1st visit/orientation/education at 0800. Patient was referred to PR by Dr. Isaiah Serge due to ILD (J84.9). During orientation advised patient on arrival and appointment times what to wear, what to do before, during and after exercise. Reviewed attendance and class policy.  Pt is scheduled to return Pulmonary Rehab on 06/22/22 at 1500. Pt was advised to come to class 15 minutes before class starts.  Discussed RPE/Dpysnea scales. Patient participated in warm up stretches. Patient was able to complete 6 minute walk test.  Patient was measured for the equipment. Discussed equipment safety with patient. Took patient pre-anthropometric measurements. Patient finished visit at 1000.

## 2022-06-11 NOTE — Progress Notes (Unsigned)
[facility]                                     Cardiology Office Note:    Date:  06/15/2022   ID:  [name], DOB 23-Dec-1950, MRN [id number]  PCP:  Philip Aspen, Limmie Patricia, MD  [facility] Cardiologist:  Olga Millers, MD / Dr. Excell Seltzer, MD and Dr. Leafy Ro, MD (TAVR)/ Watchman (Dr. Excell Seltzer)  [facility] Electrophysiologist:  None   Referring MD: Filbert Schilder, NP   Chief Complaint  Patient presents with   Follow-up    6 month s/p LAAO   History of Present Illness:    [name] is a 72 y.o. 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 He is being seen today for 6 month s/p LAAO and TAVR follow up.  . [name] has a long hx of cirrhosis complicated by thrombocytopenia and is followed closely by GI. Additionally he is followed by pulmonology due to severe interstitial lung disease. He was monitored with surveillance echocardiogram imaging for progressive aortic stenosis and is now s/p  TAVR with a 26 mm Edwards Sapien 3 THV via the TF approach on 03/16/22. He has also undergone LAAO closure with Watchman given hx of complicated thrombocytopenia and GI bleeding. He was initially continued on Plavix however this was transitioned to ASA given the above bleeding concerns.   In structural heart follow up, he was stable from a CV standpoint however had continued trouble with hypoxia and increased oxygen demand. He was followed closely by his pulmonary team and was recommended to wearing oxygne now wearing his oxygen out of his home. He feels better with this. He was seen by Dr. Leonides Schanz with hematology given abnormal free light chain amyloid labs. There was low concern for AL or monoclonal gammopathy however repeat labs were draw with plans to pursue marrow biopsy if  indicated. PYP scan was performed 3/27 which (after review with Dr. Gasper Lloyd) was felt to be negative for amyloidosis. He has also been following with Dr. Jens Som and has been transitioned to torsemide due to LE edema. He was last seen 06/09/22 at which time he was felt to be stable with no changes.   Today he is here with his wife and reports that he has been well. He is wearing his oxygen now while out ambulating. He continues to work full time. Long discussion about his poor compliance with torsemide, mainly due to frequent urination and trying to work (works outside of an office). He states he has only been taking Torsemide 60mg  BID two to three times a week. Other days he will take 60mg  in the AM only. His weight has increased from 207lb when seeing Dr. Jens Som to 214lb today. I reiterated the importance of taking his medications to avoid long term issues with CHF, LE cellulitis, or other complications. He denies SOB, chest pain, palpitations, orthopnea, dizziness, or syncope.    Past Medical History:  Diagnosis Date   Adjustment disorder with depressed mood 09/07/2007   Cataract    removed bilaterally   Chronic kidney disease    kidney stones   Cirrhosis (HCC)    Colon polyps    Tubular Adenoma 2010   CONGESTIVE HEART FAILURE  12/09/2008   CORONARY ARTERY DISEASE 2006   HYPERLIPIDEMIA 08/09/2006   HYPERTENSION 08/09/2006   MYOCARDIAL INFARCTION, HX OF 07/07/2004   NEPHROLITHIASIS, HX OF 08/09/2006   OBESITY 07/19/2008   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 Excell Seltzer   Pulmonary fibrosis [facility])    S/P TAVR (transcatheter aortic valve replacement) 03/16/2022   26mm S3UR via TF approach with Dr. Excell Seltzer and Dr. Leafy Ro   Sleep apnea    has a cpap - not using religiously    Past Surgical History:  Procedure Laterality Date   BIOPSY  11/18/2020   Procedure: BIOPSY;  Surgeon: Benancio Deeds, MD;  Location: [facility];   Service: Gastroenterology;;   CARDIOVERSION N/A 12/14/2017   Procedure: CARDIOVERSION;  Surgeon: Chilton Si, MD;  Location: [facility] ENDOSCOPY;  Service: Cardiovascular;  Laterality: N/A;   CARDIOVERSION N/A 10/01/2019   Procedure: CARDIOVERSION;  Surgeon: Jodelle Red, MD;  Location: [facility] ENDOSCOPY;  Service: Cardiovascular;  Laterality: N/A;   COLONOSCOPY     COLONOSCOPY WITH PROPOFOL N/A 11/18/2020   Procedure: COLONOSCOPY WITH PROPOFOL;  Surgeon: Benancio Deeds, MD;  Location: [facility] ENDOSCOPY;  Service: Gastroenterology;  Laterality: N/A;   CORONARY ARTERY BYPASS GRAFT  2006   ESOPHAGOGASTRODUODENOSCOPY (EGD) WITH PROPOFOL N/A 11/18/2020   Procedure: ESOPHAGOGASTRODUODENOSCOPY (EGD) WITH PROPOFOL;  Surgeon: Benancio Deeds, MD;  Location: [facility] ENDOSCOPY;  Service: Gastroenterology;  Laterality: N/A;   INTRAOPERATIVE TRANSTHORACIC ECHOCARDIOGRAM N/A 03/16/2022   Procedure: INTRAOPERATIVE TRANSTHORACIC ECHOCARDIOGRAM;  Surgeon: Tonny Bollman, MD;  Location: [facility] INVASIVE CV LAB;  Service: Open Heart Surgery;  Laterality: N/A;   LEFT ATRIAL APPENDAGE OCCLUSION N/A 12/17/2021   Procedure: LEFT ATRIAL APPENDAGE OCCLUSION;  Surgeon: Tonny Bollman, MD;  Location: [facility] INVASIVE CV LAB;  Service: Cardiovascular;  Laterality: N/A;   POLYPECTOMY     POLYPECTOMY  11/18/2020   Procedure: POLYPECTOMY;  Surgeon: Benancio Deeds, MD;  Location: [facility] ENDOSCOPY;  Service: Gastroenterology;;   RIGHT/LEFT HEART CATH AND CORONARY/GRAFT ANGIOGRAPHY N/A 02/17/2022   Procedure: RIGHT/LEFT HEART CATH AND CORONARY/GRAFT ANGIOGRAPHY;  Surgeon: Tonny Bollman, MD;  Location: [facility] INVASIVE CV LAB;  Service: Cardiovascular;  Laterality: N/A;   TEE WITHOUT CARDIOVERSION N/A 12/17/2021   Procedure: TRANSESOPHAGEAL ECHOCARDIOGRAM (TEE);  Surgeon: Tonny Bollman, MD;  Location: [facility] INVASIVE CV LAB;  Service: Cardiovascular;  Laterality: N/A;   TRANSCATHETER AORTIC VALVE REPLACEMENT, TRANSFEMORAL Right  03/16/2022   Procedure: Transcatheter Aortic Valve Replacement, Transfemoral;  Surgeon: Tonny Bollman, MD;  Location: [facility] INVASIVE CV LAB;  Service: Open Heart Surgery;  Laterality: Right;    Current Medications: Current Meds  Medication Sig   acetaminophen (TYLENOL) 500 MG tablet Take 1,000 mg by mouth daily as needed for moderate pain or headache.   amoxicillin (AMOXIL) 500 MG tablet Take 4 tablets (2,000 mg total) by mouth as directed. 1 HOUR PRIOR TO DENTAL APPOINTMENTS   aspirin EC 81 MG tablet Take 1 tablet (81 mg total) by mouth daily. Swallow whole.   atorvastatin (LIPITOR) 80 MG tablet TAKE 1 TABLET BY MOUTH EVERY DAY   carvedilol (COREG) 3.125 MG tablet TAKE 1 TABLET BY MOUTH TWICE A DAY WITH FOOD   [employer] VITAMIN B12 1000 MCG tablet TAKE 1 TABLET BY MOUTH EVERY DAY   empagliflozin (JARDIANCE) 10 MG TABS tablet Take 1 tablet (10 mg total) by mouth daily before breakfast.   KLOR-CON M20 20 MEQ tablet TAKE 1 TABLET BY MOUTH EVERY DAY   Misc Natural Products (LIVER PROTECT PO) Take 1  capsule by mouth 2 (two) times daily. From [employer]   OXYGEN Inhale into the lungs.   Pirfenidone 267 MG TABS Take 3 tablets (801 mg total) by mouth 3 (three) times daily with meals.   tamsulosin (FLOMAX) 0.4 MG CAPS capsule TAKE ONE CAPSULE BY MOUTH EVERY DAY AFTER SUPPER   torsemide 60 MG TABS Take 60 mg by mouth daily. If your weight increases to 205lb or more, take an extra 60mg  of torsemide.     Allergies:   Patient has no known allergies.   Social History   Socioeconomic History   Marital status: Significant Other    Spouse name: Not on file   Number of children: 0   Years of education: Not on file   Highest education level: Not on file  Occupational History   Occupation: Barrister's clerk  Tobacco Use   Smoking status: Never    Passive exposure: Never   Smokeless tobacco: Never  Vaping Use   Vaping Use: Never used  Substance and Sexual Activity   Alcohol use:  Not Currently    Comment: occasional   Drug use: No   Sexual activity: Not on file  Other Topics Concern   Not on file  Social History Narrative   Not on file   Social Determinants of Health   Financial Resource Strain: Not on file  Food Insecurity: No Food Insecurity (12/17/2021)   Hunger Vital Sign    Worried About Running Out of Food in the Last Year: Never true    Ran Out of Food in the Last Year: Never true  Transportation Needs: No Transportation Needs (12/17/2021)   PRAPARE - Administrator, Civil Service (Medical): No    Lack of Transportation (Non-Medical): No  Physical Activity: Not on file  Stress: Not on file  Social Connections: Not on file     Family History: The patient's family history includes Bipolar disorder in his brother; Heart disease in his father; Hyperlipidemia in his mother; Hypertension in his mother; Ulcers in his father. There is no history of Colon cancer, Colon polyps, Esophageal cancer, Rectal cancer, or Stomach cancer.  ROS:   Please see the history of present illness.    All other systems reviewed and are negative.  EKGs/Labs/Other Studies Reviewed:    The following studies were reviewed today:  Echocardiogram 06/14/22:   1. Left ventricular ejection fraction, by estimation, is 50 to 55%. The  left ventricle has low normal function. The left ventricle demonstrates  regional wall motion abnormalities (see scoring diagram/findings for  description). There is mild left  ventricular hypertrophy of the basal-septal segment. Left ventricular  diastolic function could not be evaluated. There is hypokinesis of the  left ventricular, apical segment.   2. Right ventricular systolic function is normal. The right ventricular  size is normal.   3. Left atrial size was moderately dilated.   4. The mitral valve is degenerative. There is mild calcification of the  mitral valve leaflet(s). Mild to moderate mitral annular calcification.       Trivial mitral valve regurgitation. No evidence of mitral stenosis.  The mean mitral valve gradient is 4.0 mmHg.   5. The aortic valve has been repaired/replaced. Aortic valve  regurgitation is not visualized. No aortic stenosis is present. There is a  26 mm Ultra, stented (TAVR) valve present in the aortic position. Aortic  valve mean gradient measures 18.0 mmHg.  Aortic valve peak gradient measures 33.6 mmHg. Aortic valve area,  by VTI  measures 2.19 cm.   6. The inferior vena cava is normal in size with greater than 50%  respiratory variability, suggesting right atrial pressure of 3  mmHg.Compared to study dated 04/23/22, the mean TAVR gradient remains the  same and AVR (VTI) has increased from 1.61cm2 to  2.19cm2, VMax has decreased from 2.97 to 2.66m/s and DVI has decreased  slightly from 0.42 to 0.41. Overall stable TAVR.    TAVR OPERATIVE NOTE     Date of Procedure:                03/16/2022   Preoperative Diagnosis:      Severe Aortic Stenosis    Postoperative Diagnosis:    Same    Procedure:        Transcatheter Aortic Valve Replacement - Percutaneous  Transfemoral Approach             Edwards Sapien 3 Ultra Resilia THV (size 26 mm, serial # [id number])              Co-Surgeons:                        Eugenio Hoes, MD and Tonny Bollman, MD   Anesthesiologist:                  Marguerita Merles, MD   Echocardiographer:              Charlton Haws, MD   Pre-operative Echo Findings: Severe aortic stenosis Mild left ventricular systolic dysfunction   Post-operative Echo Findings: No paravalvular leak unchanged left ventricular systolic function  _____________   Echocardiogram 03/17/22:  1. Left ventricular ejection fraction, by estimation, is 45 to 50%. The  left ventricle has mildly decreased function. The left ventricle  demonstrates global hypokinesis. Left ventricular diastolic function could  not be evaluated. Elevated left  ventricular end-diastolic pressure.    2. Right ventricular systolic function is normal. The right ventricular  size is normal. There is normal pulmonary artery systolic pressure. The  estimated right ventricular systolic pressure is 33.0 mmHg.   3. Left atrial size was moderately dilated.   4. The mitral valve is degenerative. Trivial mitral valve regurgitation.  No evidence of mitral stenosis. Moderate mitral annular calcification.   5. The aortic valve has been repaired/replaced. Aortic valve  regurgitation is not visualized. No aortic stenosis is present. There is a  26 mm Edwards Ultra, stented (TAVR) valve present in the aortic position.  Procedure Date: 03/16/22. Aortic valve area,   by VTI measures 2.12 cm. Aortic valve mean gradient measures 15.5 mmHg.  Aortic valve Vmax measures 2.60 m/s.   6. The inferior vena cava is dilated in size with <50% respiratory  variability, suggesting right atrial pressure of 15 mmHg.   7. Compared to immdediate post TAVR study 03/16/22, the mean TAVR gradient  has increased from to , Vmax has increased from 2.2 to 2.22m/s  and DVI has increased from 0.57 to 0.67.    Limited Echocardiogram 03/16/22 IMPRESSIONS  1. Septal apical and inferior wall hypokinesis . Left ventricular  ejection fraction, by estimation, is 45 to 50%. The left ventricle has  mildly decreased function. The left ventricle demonstrates regional wall  motion abnormalities (see scoring  diagram/findings for description). The left ventricular internal cavity  size was mildly dilated.   2. The mitral valve is abnormal. Trivial mitral valve regurgitation.  Moderate mitral annular calcification.   3. Pre TAVR  tri leaflet AV with severe AS mean gradient 27 peak 46 mmHg  AVA 1.6 cm2 supine in cath lab       Post TAVR: well place 26 mm Sapien 3 valve with no significant PVL  mean gradient 11 peak 20 mmHg AVI 3.1 cm2. The aortic valve has been  repaired/replaced. There is a 26 mm Sapien prosthetic (TAVR) valve  present  in the aortic position. Procedure Date:   03/16/2022.   EKG:  EKG is not ordered today  Recent Labs: 04/21/2022: ALT 25 05/11/2022: BNP 202.2; Magnesium 2.4 06/14/2022: BUN 16; Creatinine, Ser 1.10; Hemoglobin 11.5; Platelets 58; Potassium 3.7; Sodium 141   Recent Lipid Panel    Component Value Date/Time   CHOL 118 02/12/2021 1438   CHOL 111 07/23/2019 1144   TRIG 66.0 02/12/2021 1438   HDL 40.30 02/12/2021 1438   HDL 48 07/23/2019 1144   CHOLHDL 3 02/12/2021 1438   VLDL 13.2 02/12/2021 1438   LDLCALC 65 02/12/2021 1438   LDLCALC 53 11/30/2019 0929   LDLDIRECT 198.2 07/11/2009 0925   Physical Exam:    VS:  BP 134/66   Pulse 71   Ht 5\' 6"  (1.676 m)   Wt 214 lb 9.6 oz (97.3 kg)   SpO2 (!) 86%   BMI 34.64 kg/m     Wt Readings from Last 3 Encounters:  06/14/22 214 lb 9.6 oz (97.3 kg)  06/10/22 209 lb 10.5 oz (95.1 kg)  06/09/22 207 lb 9.6 oz (94.2 kg)    General: Well developed, well nourished, NAD Lungs:Clear to ausculation bilaterally. No wheezes, rales, or rhonchi. Breathing is unlabored. Cardiovascular: RRR with S1 S2. + murmurs Extremities: 2/3+ BLE edema. Neuro: Alert and oriented. No focal deficits. No facial asymmetry. MAE spontaneously. Psych: Responds to questions appropriately with normal affect.    ASSESSMENT/PLAN:    Severe AS: Patient doing well with NYHA class II symptoms mainly in the setting of his chronic lung disease s/p successful TAVR with a 26 mm Edwards Sapien 3 THV via the TF approach on 03/16/22. Echo today with stable valve function with a mean gradient at , peak at 33.31mmHg, and AVA by VTI at 2.19cm2. Continue ASA monotherapy and lifelong dental SBE with Amoxicillin.     Paroxysmal atrial fibrillation: s/p LAAO with 27mm Watchman device on 12/17/21. Continue ASA monotherapy in the setting of TAVR. Maintaining NSR today.   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 Long discussion today about ways to help him comply with 60mg  BID dosing. He is willing to work from home some so that he can be close to a bathroom when taking torsemide. I have supplied a letter to his employer for this. Weight is elevated today at 214lb. Strongly encouraged him to start torsemide 60mg  BID today. BMET today.    Pulmonary fibrosis with hypoxia: Follows closely with pulmonary. Recently started wearing portable oxygen while ambulating and feels better. Working on fluid balance as above. Continue regular follow up with pulm.    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 Tolerating ASA 81mg  with platelets at his baseline of 58. No s/s of acute bleeding.     Elevated RAISE score: Kappa/lambda light chain panel abnormal with elevated kappa and lambda chains  with elevated ratio at 1.96. Urine immunofixation NEGATIVE. Myeloma panel with evidence of polyclonal increase detected in one or more immunoglobulin. Saw Dr. Leonides Schanz 3/20 with repeat labs scheduled. PYP scan 3/27 negative (reviewed by Dr. Gasper Lloyd).      Medication Adjustments/Labs and Tests Ordered: Current medicines are reviewed at length with the patient today.  Concerns regarding medicines are outlined above.  Orders Placed This Encounter  Procedures   Basic metabolic panel   CBC   No orders of the defined types were placed in this encounter.   Patient Instructions  Medication Instructions:  Your physician recommends that you continue on your current medications as directed. Please refer to the Current Medication list given to you today.  *If you need a refill on your cardiac medications before your next appointment, please call your pharmacy*   Lab Work: TODAY: BMET,  CBC If you have labs (blood work) drawn today and your tests are completely normal, you will receive your results only by: MyChart Message (if you have MyChart) OR A paper copy in the mail If you have any lab test that is abnormal or we need to change your treatment, we will call you to review the results.   Testing/Procedures: NONE   Follow-Up: At [facility], you and your health needs are our priority.  As part of our continuing mission to provide you with exceptional heart care, we have created designated Provider Care Teams.  These Care Teams include your primary Cardiologist (physician) and Advanced Practice Providers (APPs -  Physician Assistants and Nurse Practitioners) who all work together to provide you with the care you need, when you need it.  We recommend signing up for the patient portal called "MyChart".  Sign up information is provided on this After Visit Summary.  MyChart is used to connect with patients for Virtual Visits (Telemedicine).  Patients are able to view lab/test results, encounter notes, upcoming appointments, etc.  Non-urgent messages can be sent to your provider as well.   To learn more about what you can do with MyChart, go to [email or web address].    Your next appointment:   1 year(s)  Provider:   Georgie Chard, NP    Signed, Georgie Chard, NP  06/15/2022 10:11 AM    [facility]

## 2022-06-14 ENCOUNTER — Other Ambulatory Visit: Payer: Self-pay | Admitting: Cardiology

## 2022-06-14 ENCOUNTER — Ambulatory Visit: Payer: 59 | Attending: Cardiology | Admitting: Cardiology

## 2022-06-14 ENCOUNTER — Encounter: Payer: Self-pay | Admitting: Cardiology

## 2022-06-14 ENCOUNTER — Ambulatory Visit (HOSPITAL_BASED_OUTPATIENT_CLINIC_OR_DEPARTMENT_OTHER): Payer: 59

## 2022-06-14 VITALS — BP 134/66 | HR 71 | Ht 66.0 in | Wt 214.6 lb

## 2022-06-14 DIAGNOSIS — Z95818 Presence of other cardiac implants and grafts: Secondary | ICD-10-CM | POA: Diagnosis present

## 2022-06-14 DIAGNOSIS — Z951 Presence of aortocoronary bypass graft: Secondary | ICD-10-CM | POA: Diagnosis present

## 2022-06-14 DIAGNOSIS — I5042 Chronic combined systolic (congestive) and diastolic (congestive) heart failure: Secondary | ICD-10-CM | POA: Diagnosis present

## 2022-06-14 DIAGNOSIS — J849 Interstitial pulmonary disease, unspecified: Secondary | ICD-10-CM | POA: Diagnosis present

## 2022-06-14 DIAGNOSIS — J8410 Pulmonary fibrosis, unspecified: Secondary | ICD-10-CM | POA: Diagnosis present

## 2022-06-14 DIAGNOSIS — R600 Localized edema: Secondary | ICD-10-CM

## 2022-06-14 DIAGNOSIS — R0902 Hypoxemia: Secondary | ICD-10-CM | POA: Diagnosis present

## 2022-06-14 DIAGNOSIS — Z952 Presence of prosthetic heart valve: Secondary | ICD-10-CM | POA: Diagnosis present

## 2022-06-14 DIAGNOSIS — I350 Nonrheumatic aortic (valve) stenosis: Secondary | ICD-10-CM | POA: Insufficient documentation

## 2022-06-14 DIAGNOSIS — I480 Paroxysmal atrial fibrillation: Secondary | ICD-10-CM

## 2022-06-14 DIAGNOSIS — I2510 Atherosclerotic heart disease of native coronary artery without angina pectoris: Secondary | ICD-10-CM

## 2022-06-14 DIAGNOSIS — I2721 Secondary pulmonary arterial hypertension: Secondary | ICD-10-CM

## 2022-06-14 DIAGNOSIS — I10 Essential (primary) hypertension: Secondary | ICD-10-CM | POA: Diagnosis present

## 2022-06-14 DIAGNOSIS — Z79899 Other long term (current) drug therapy: Secondary | ICD-10-CM

## 2022-06-14 LAB — ECHOCARDIOGRAM COMPLETE
AR max vel: 2.12 cm2
AV Area VTI: 2.19 cm2
AV Area mean vel: 2.08 cm2
AV Mean grad: 18 mmHg
AV Peak grad: 33.6 mmHg
Ao pk vel: 2.9 m/s
Area-P 1/2: 2.44 cm2
P 1/2 time: 679 msec
S' Lateral: 4 cm

## 2022-06-14 MED ORDER — PERFLUTREN LIPID MICROSPHERE
1.0000 mL | INTRAVENOUS | Status: AC | PRN
Start: 2022-06-14 — End: 2022-06-14
  Administered 2022-06-14: 2 mL via INTRAVENOUS

## 2022-06-14 NOTE — Patient Instructions (Signed)
Medication Instructions:  Your physician recommends that you continue on your current medications as directed. Please refer to the Current Medication list given to you today.  *If you need a refill on your cardiac medications before your next appointment, please call your pharmacy*   Lab Work: TODAY: BMET, CBC If you have labs (blood work) drawn today and your tests are completely normal, you will receive your results only by: MyChart Message (if you have MyChart) OR A paper copy in the mail If you have any lab test that is abnormal or we need to change your treatment, we will call you to review the results.   Testing/Procedures: NONE   Follow-Up: At [facility], you and your health needs are our priority.  As part of our continuing mission to provide you with exceptional heart care, we have created designated Provider Care Teams.  These Care Teams include your primary Cardiologist (physician) and Advanced Practice Providers (APPs -  Physician Assistants and Nurse Practitioners) who all work together to provide you with the care you need, when you need it.  We recommend signing up for the patient portal called "MyChart".  Sign up information is provided on this After Visit Summary.  MyChart is used to connect with patients for Virtual Visits (Telemedicine).  Patients are able to view lab/test results, encounter notes, upcoming appointments, etc.  Non-urgent messages can be sent to your provider as well.   To learn more about what you can do with MyChart, go to [email or web address].    Your next appointment:   1 year(s)  Provider:   Georgie Chard, NP

## 2022-06-15 LAB — CBC
Hematocrit: 33.1 % — ABNORMAL LOW (ref 37.5–51.0)
Hemoglobin: 11.5 g/dL — ABNORMAL LOW (ref 13.0–17.7)
MCH: 31.6 pg (ref 26.6–33.0)
MCHC: 34.7 g/dL (ref 31.5–35.7)
MCV: 91 fL (ref 79–97)
Platelets: 58 10*3/uL — CL (ref 150–450)
RBC: 3.64 x10E6/uL — ABNORMAL LOW (ref 4.14–5.80)
RDW: 14.6 % (ref 11.6–15.4)
WBC: 4.9 10*3/uL (ref 3.4–10.8)

## 2022-06-15 LAB — BASIC METABOLIC PANEL
BUN/Creatinine Ratio: 15 (ref 10–24)
BUN: 16 mg/dL (ref 8–27)
CO2: 23 mmol/L (ref 20–29)
Calcium: 9.3 mg/dL (ref 8.6–10.2)
Chloride: 106 mmol/L (ref 96–106)
Creatinine, Ser: 1.1 mg/dL (ref 0.76–1.27)
Glucose: 132 mg/dL — ABNORMAL HIGH (ref 70–99)
Potassium: 3.7 mmol/L (ref 3.5–5.2)
Sodium: 141 mmol/L (ref 134–144)
eGFR: 71 mL/min/{1.73_m2} (ref >59)

## 2022-06-21 ENCOUNTER — Telehealth: Payer: Self-pay | Admitting: Pharmacist

## 2022-06-21 NOTE — Telephone Encounter (Signed)
Received VM from patient stating he is having issues filling his pirfenidone. Pharmacy is not disclosing price.  I called pharmacy and per automated system, shipment was delivered on 06/16/2022. Patient has been advised that there is likely no copay based on last month's fill. I have recommended he also ensure that any payment information like credit card is taken off his record at the pharmacy to prevent any "automatic charges." He verbalized understanding.  He states he is getting ready to increase to pirfenidone 3 tabs three times daily. He is overall tolerating well. Experiencing some nausea but transient. He confirms he is taking pirfenidone with meals which helps. Advised him to notify our clinic if any significant worsening of nausea once he increases to 801 mg three times daily. We may have to prescribe ondansetron if significant nausea  Chesley Mires, PharmD, MPH, BCPS, CPP Clinical Pharmacist (Rheumatology and Pulmonology)

## 2022-06-22 ENCOUNTER — Encounter (HOSPITAL_COMMUNITY)
Admission: RE | Admit: 2022-06-22 | Discharge: 2022-06-22 | Disposition: A | Discharge status: Home / Self Care | Payer: 59 | Source: Ambulatory Visit | Attending: Pulmonary Disease | Admitting: Pulmonary Disease

## 2022-06-22 VITALS — Wt 219.4 lb

## 2022-06-22 DIAGNOSIS — J849 Interstitial pulmonary disease, unspecified: Secondary | ICD-10-CM | POA: Diagnosis not present

## 2022-06-22 LAB — GLUCOSE, CAPILLARY: Glucose-Capillary: 138 mg/dL — ABNORMAL HIGH (ref 70–99)

## 2022-06-22 NOTE — Progress Notes (Signed)
Daily Session Note  Patient Details  Name: [name] MRN: [id number] Date of Birth: 06-22-1950 Referring Provider:   Flowsheet Row PULMONARY REHAB OTHER RESP ORIENTATION from 06/10/2022 in [facility] CARDIAC REHABILITATION  Referring Provider Dr. Isaiah Serge       Encounter Date: 06/22/2022  Check In:  Session Check In - 06/22/22 1457       Check-In   Supervising physician immediately available to respond to emergencies [facility] MD immediately available    Location [facility]    Staff Present Ross Ludwig, BS, Exercise Physiologist;Loralei Radcliffe Daphine Deutscher, RN, BSN    Virtual Visit No    Medication changes reported     No    Fall or balance concerns reported    Yes    Comments Walks with a walker and gait is unsteady.    Tobacco Cessation No Change    Warm-up and Cool-down Performed as group-led instruction    Resistance Training Performed Yes      Pain Assessment   Currently in Pain? No/denies    Pain Score 0-No pain             Capillary Blood Glucose: Results for orders placed or performed during the hospital encounter of 06/22/22 (from the past 24 hour(s))  Glucose, capillary     Status: Abnormal   Collection Time: 06/22/22  2:52 PM  Result Value Ref Range   Glucose-Capillary 138 (H) 70 - 99 mg/dL      Social History   Tobacco Use  Smoking Status Never   Passive exposure: Never  Smokeless Tobacco Never    Goals Met:  Proper associated with RPD/PD & O2 Sat Independence with exercise equipment Using PLB without cueing & demonstrates good technique Exercise tolerated well Queuing for purse lip breathing No report of concerns or symptoms today Strength training completed today  Goals Unmet:  Not Applicable  Comments: Checkout at 1430.   Dr. Erick Blinks is Medical Director for [facility].

## 2022-06-23 NOTE — Progress Notes (Signed)
Pulmonary Individual Treatment Plan  Patient Details  Name: [name] MRN: [id number] Date of Birth: 18-Jul-1950 Referring Provider:   Flowsheet Row PULMONARY REHAB OTHER RESP ORIENTATION from 06/10/2022 in [facility] CARDIAC REHABILITATION  Referring Provider Dr. Isaiah Serge       Initial Encounter Date:  Flowsheet Row PULMONARY REHAB OTHER RESP ORIENTATION from 06/10/2022 in [facility] CARDIAC REHABILITATION  Date 06/10/22       Visit Diagnosis: ILD (interstitial lung disease) (HCC)  Patient's Home Medications on Admission:   Current Outpatient Medications:    acetaminophen (TYLENOL) 500 MG tablet, Take 1,000 mg by mouth daily as needed for moderate pain or headache., Disp: , Rfl:    amoxicillin (AMOXIL) 500 MG tablet, Take 4 tablets (2,000 mg total) by mouth as directed. 1 HOUR PRIOR TO DENTAL APPOINTMENTS, Disp: 12 tablet, Rfl: 6   aspirin EC 81 MG tablet, Take 1 tablet (81 mg total) by mouth daily. Swallow whole., Disp: 30 tablet, Rfl: 12   atorvastatin (LIPITOR) 80 MG tablet, TAKE 1 TABLET BY MOUTH EVERY DAY, Disp: 90 tablet, Rfl: 1   carvedilol (COREG) 3.125 MG tablet, TAKE 1 TABLET BY MOUTH TWICE A DAY WITH FOOD, Disp: 60 tablet, Rfl: 5   [employer] VITAMIN B12 1000 MCG tablet, TAKE 1 TABLET BY MOUTH EVERY DAY, Disp: 180 tablet, Rfl: 1   empagliflozin (JARDIANCE) 10 MG TABS tablet, Take 1 tablet (10 mg total) by mouth daily before breakfast., Disp: 30 tablet, Rfl: 11   KLOR-CON M20 20 MEQ tablet, TAKE 1 TABLET BY MOUTH EVERY DAY, Disp: 30 tablet, Rfl: 11   Misc Natural Products (LIVER PROTECT PO), Take 1 capsule by mouth 2 (two) times daily. From [employer], Disp: , Rfl:    OXYGEN, Inhale into the lungs., Disp: , Rfl:    Pirfenidone 267 MG TABS, Take 3 tablets (801 mg total) by mouth 3 (three) times daily with meals., Disp: 270 tablet, Rfl: 5   tamsulosin (FLOMAX) 0.4 MG CAPS capsule, TAKE ONE CAPSULE BY MOUTH EVERY DAY AFTER SUPPER, Disp: 90 capsule, Rfl: 1   torsemide 60 MG  TABS, Take 60 mg by mouth daily. 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 Excell Seltzer and Dr. Leafy Ro   Sleep apnea    has a cpap - not using religiously    Tobacco Use: Social History   Tobacco Use  Smoking Status Never   Passive exposure: Never  Smokeless Tobacco Never    Labs: Review Flowsheet  More data exists      Latest Ref Rng & Units 11/30/2019 02/12/2021 08/10/2021 02/17/2022 03/16/2022  Labs for ITP Cardiac and Pulmonary Rehab  Cholestrol 0 - 200 mg/dL 409  811  - - -  LDL (calc) 0 - 99 mg/dL 53  65  - - -  HDL-C >60.45 mg/dL 44  40.98  - - -  Trlycerides 0.0 - 149.0 mg/dL 68  11.9  - - -  Hemoglobin A1c 4.0 - 5.6 % 5.8  5.9  5.8  - -  PH, Arterial 7.35 - 7.45 - - - 7.443  7.373   PCO2 arterial 32 - 48 mmHg - - - 30.4  36.7   Bicarbonate 20.0 - 28.0 mmol/L - - - 20.8  22.5  21.4   TCO2 22 - 32 mmol/L - - - 22  23  19  22  20    Acid-base deficit 0.0 - 2.0 mmol/L - - - 3.0  1.0  3.0   O2 Saturation % - - - 94  79  89     Capillary Blood Glucose: Lab Results  Component Value Date   GLUCAP 138 (H) 06/22/2022   GLUCAP 128 (H) 06/10/2022     Pulmonary Assessment Scores:  Pulmonary Assessment Scores     Row  Name 06/10/22 0950         ADL UCSD   ADL Phase Entry     SOB Score total 61     Walk 2     Stairs 1     Bath 1     Dress 2     Shop 2       CAT Score   CAT Score 25       mMRC Score   mMRC Score 3             UCSD: Self-administered rating of dyspnea associated with activities of daily living (ADLs) 6-point scale (0 = "not at all" to 5 = "maximal or unable to do because of breathlessness")  Scoring Scores range from 0 to 120.  Minimally important difference is 5 units  CAT: CAT can identify the health impairment of COPD patients and is better correlated with disease progression.  CAT has a scoring range of zero to 40. The CAT score is classified into four groups of low (less than 10), medium (10 - 20), high (21-30) and very high (31-40) based on the impact level of disease on health status. A CAT score over 10 suggests significant symptoms.  A worsening CAT score could be explained by an exacerbation, poor medication adherence, poor inhaler technique, or progression of COPD or comorbid conditions.  CAT MCID is 2 points  mMRC: mMRC (Modified Medical Research Council) Dyspnea Scale is used to assess the degree of baseline functional disability in patients of respiratory disease due to dyspnea. No minimal important difference is established. A decrease in score of 1 point or greater is considered a positive change.   Pulmonary Function Assessment:   Exercise Target Goals: Exercise Program Goal: Individual exercise prescription set using results from initial 6 min walk test and THRR while considering  patient's activity barriers and safety.   Exercise Prescription Goal: Initial exercise prescription builds to 30-45 minutes a day of aerobic activity, 2-3 days per week.  Home exercise guidelines will be given to patient during program as part of exercise prescription that the participant will acknowledge.  Activity Barriers & Risk Stratification:  Activity Barriers &  Cardiac Risk Stratification - 06/10/22 0855       Activity Barriers & Cardiac Risk Stratification   Activity Barriers Back Problems;Deconditioning;Shortness of Breath;Assistive Device;Balance Concerns    Cardiac Risk Stratification High  6 Minute Walk:  6 Minute Walk     Row Name 06/10/22 1023         6 Minute Walk   Phase Initial     Distance 450 feet     Walk Time 6 minutes     # of Rest Breaks 2     MPH 0.85     METS 0.86     RPE 13     Perceived Dyspnea  14     VO2 Peak 3.03     Symptoms Yes (comment)     Comments pt needed two breaks due to SOB and oxygen levels dropping (79-80)     Resting HR 63 bpm     Resting BP 132/42     Resting Oxygen Saturation  92 %     Exercise Oxygen Saturation  during 6 min walk 78 %     Max Ex. HR 80 bpm     Max Ex. BP 152/60     2 Minute Post BP 140/58       Interval HR   1 Minute HR 81     2 Minute HR 80     3 Minute HR 79     4 Minute HR 80     5 Minute HR 80     6 Minute HR 79     2 Minute Post HR 74     Interval Heart Rate? Yes       Interval Oxygen   Interval Oxygen? Yes     Baseline Oxygen Saturation % 92 %     1 Minute Oxygen Saturation % 88 %     1 Minute Liters of Oxygen 8 L     2 Minute Oxygen Saturation % 78 %     2 Minute Liters of Oxygen 8 L     3 Minute Oxygen Saturation % 81 %     3 Minute Liters of Oxygen 8 L     4 Minute Oxygen Saturation % 88 %     4 Minute Liters of Oxygen 8 L     5 Minute Oxygen Saturation % 81 %     5 Minute Liters of Oxygen 8 L     6 Minute Oxygen Saturation % 79 %     6 Minute Liters of Oxygen 8 L     2 Minute Post Oxygen Saturation % 86 %     2 Minute Post Liters of Oxygen 8 L              Oxygen Initial Assessment:  Oxygen Initial Assessment - 06/10/22 1031       Home Oxygen   Home Oxygen Device Home Concentrator;E-Tanks    Sleep Oxygen Prescription Continuous    Liters per minute 4    Home Resting Oxygen Prescription Continuous    Liters per  minute 4    Compliance with Home Oxygen Use Yes      Initial 6 min Walk   Oxygen Used Continuous    Liters per minute 8      Program Oxygen Prescription   Program Oxygen Prescription Continuous    Liters per minute 6    Comments pt will start at 6 Liters and if need go to 8 liters      Intervention   Short Term Goals To learn and exhibit compliance with exercise, home and travel O2 prescription;To learn and understand importance of monitoring SPO2 with pulse oximeter and demonstrate accurate use of  the pulse oximeter.;To learn and understand importance of maintaining oxygen saturations>88%;To learn and demonstrate proper pursed lip breathing techniques or other breathing techniques.     Long  Term Goals Exhibits compliance with exercise, home  and travel O2 prescription;Maintenance of O2 saturations>88%;Verbalizes importance of monitoring SPO2 with pulse oximeter and return demonstration;Exhibits proper breathing techniques, such as pursed lip breathing or other method taught during program session             Oxygen Re-Evaluation:  Oxygen Re-Evaluation     Row Name 06/23/22 1435             Program Oxygen Prescription   Program Oxygen Prescription Continuous       Liters per minute 6       Comments Pt did well on 6 liters         Home Oxygen   Home Oxygen Device Home Concentrator;E-Tanks       Sleep Oxygen Prescription Continuous       Liters per minute 4       Home Resting Oxygen Prescription Continuous       Liters per minute 4       Compliance with Home Oxygen Use Yes         Goals/Expected Outcomes   Short Term Goals To learn and exhibit compliance with exercise, home and travel O2 prescription;To learn and understand importance of monitoring SPO2 with pulse oximeter and demonstrate accurate use of the pulse oximeter.;To learn and understand importance of maintaining oxygen saturations>88%;To learn and demonstrate proper pursed lip breathing techniques or other  breathing techniques.        Long  Term Goals Exhibits compliance with exercise, home  and travel O2 prescription;Maintenance of O2 saturations>88%;Verbalizes importance of monitoring SPO2 with pulse oximeter and return demonstration;Exhibits proper breathing techniques, such as pursed lip breathing or other method taught during program session       Goals/Expected Outcomes compliance                Oxygen Discharge (Final Oxygen Re-Evaluation):  Oxygen Re-Evaluation - 06/23/22 1435       Program Oxygen Prescription   Program Oxygen Prescription Continuous    Liters per minute 6    Comments Pt did well on 6 liters      Home Oxygen   Home Oxygen Device Home Concentrator;E-Tanks    Sleep Oxygen Prescription Continuous    Liters per minute 4    Home Resting Oxygen Prescription Continuous    Liters per minute 4    Compliance with Home Oxygen Use Yes      Goals/Expected Outcomes   Short Term Goals To learn and exhibit compliance with exercise, home and travel O2 prescription;To learn and understand importance of monitoring SPO2 with pulse oximeter and demonstrate accurate use of the pulse oximeter.;To learn and understand importance of maintaining oxygen saturations>88%;To learn and demonstrate proper pursed lip breathing techniques or other breathing techniques.     Long  Term Goals Exhibits compliance with exercise, home  and travel O2 prescription;Maintenance of O2 saturations>88%;Verbalizes importance of monitoring SPO2 with pulse oximeter and return demonstration;Exhibits proper breathing techniques, such as pursed lip breathing or other method taught during program session    Goals/Expected Outcomes compliance             Initial Exercise Prescription:  Initial Exercise Prescription - 06/10/22 1000       Date of Initial Exercise RX and Referring Provider   Date 06/10/22    Referring  Provider Dr. Isaiah Serge    Expected Discharge Date 09/09/22      Oxygen   Oxygen  Continuous    Liters 6    Maintain Oxygen Saturation 88% or higher      NuStep   Level 1    SPM 60    Minutes 39      Prescription Details   Frequency (times per week) 2    Duration Progress to 30 minutes of continuous aerobic without signs/symptoms of physical distress      Intensity   THRR 40-80% of Max Heartrate 58-118    Ratings of Perceived Exertion 11-13    Perceived Dyspnea 0-4      Resistance Training   Training Prescription Yes    Weight 2    Reps 10-15             Perform Capillary Blood Glucose checks as needed.  Exercise Prescription Changes:   Exercise Prescription Changes     Row Name 06/22/22 1500             Response to Exercise   Blood Pressure (Admit) 130/52       Blood Pressure (Exercise) 140/60       Blood Pressure (Exit) 136/60       Heart Rate (Admit) 70 bpm       Heart Rate (Exercise) 75 bpm       Heart Rate (Exit) 77 bpm       Oxygen Saturation (Admit) 89 %       Oxygen Saturation (Exercise) 83 %       Oxygen Saturation (Exit) 89 %       Rating of Perceived Exertion (Exercise) 12       Perceived Dyspnea (Exercise) 13       Duration Continue with 30 min of aerobic exercise without signs/symptoms of physical distress.       Intensity THRR unchanged         Progression   Progression Continue to progress workloads to maintain intensity without signs/symptoms of physical distress.         Resistance Training   Training Prescription Yes       Weight 3       Reps 10-15       Time 10 Minutes         Oxygen   Oxygen Continuous       Liters 6         NuStep   Level 1       SPM 83       Minutes 39       METs 1.9                Exercise Comments:   Exercise Goals and Review:   Exercise Goals     Row Name 06/10/22 1030 06/23/22 1432           Exercise Goals   Increase Physical Activity Yes Yes      Intervention Provide advice, education, support and counseling about physical activity/exercise needs.;Develop an  individualized exercise prescription for aerobic and resistive training based on initial evaluation findings, risk stratification, comorbidities and participant's personal goals. Provide advice, education, support and counseling about physical activity/exercise needs.;Develop an individualized exercise prescription for aerobic and resistive training based on initial evaluation findings, risk stratification, comorbidities and participant's personal goals.      Expected Outcomes Long Term: Add in home exercise to make exercise part of routine and to increase amount of physical  activity.;Short Term: Attend rehab on a regular basis to increase amount of physical activity.;Long Term: Exercising regularly at least 3-5 days a week. Long Term: Add in home exercise to make exercise part of routine and to increase amount of physical activity.;Short Term: Attend rehab on a regular basis to increase amount of physical activity.;Long Term: Exercising regularly at least 3-5 days a week.      Increase Strength and Stamina Yes Yes      Intervention Provide advice, education, support and counseling about physical activity/exercise needs.;Develop an individualized exercise prescription for aerobic and resistive training based on initial evaluation findings, risk stratification, comorbidities and participant's personal goals. Provide advice, education, support and counseling about physical activity/exercise needs.;Develop an individualized exercise prescription for aerobic and resistive training based on initial evaluation findings, risk stratification, comorbidities and participant's personal goals.      Expected Outcomes Short Term: Increase workloads from initial exercise prescription for resistance, speed, and METs.;Short Term: Perform resistance training exercises routinely during rehab and add in resistance training at home;Long Term: Improve cardiorespiratory fitness, muscular endurance and strength as measured by increased  METs and functional capacity ( ) Short Term: Increase workloads from initial exercise prescription for resistance, speed, and METs.;Short Term: Perform resistance training exercises routinely during rehab and add in resistance training at home;Long Term: Improve cardiorespiratory fitness, muscular endurance and strength as measured by increased METs and functional capacity ( )      Able to understand and use rate of perceived exertion (RPE) scale Yes Yes      Intervention Provide education and explanation on how to use RPE scale Provide education and explanation on how to use RPE scale      Expected Outcomes Short Term: Able to use RPE daily in rehab to express subjective intensity level;Long Term:  Able to use RPE to guide intensity level when exercising independently Short Term: Able to use RPE daily in rehab to express subjective intensity level;Long Term:  Able to use RPE to guide intensity level when exercising independently      Able to understand and use Dyspnea scale Yes Yes      Intervention Provide education and explanation on how to use Dyspnea scale Provide education and explanation on how to use Dyspnea scale      Expected Outcomes Short Term: Able to use Dyspnea scale daily in rehab to express subjective sense of shortness of breath during exertion;Long Term: Able to use Dyspnea scale to guide intensity level when exercising independently Short Term: Able to use Dyspnea scale daily in rehab to express subjective sense of shortness of breath during exertion;Long Term: Able to use Dyspnea scale to guide intensity level when exercising independently      Knowledge and understanding of Target Heart Rate Range (THRR) Yes Yes      Intervention Provide education and explanation of THRR including how the numbers were predicted and where they are located for reference Provide education and explanation of THRR including how the numbers were predicted and where they are located for reference       Expected Outcomes Short Term: Able to state/look up THRR;Long Term: Able to use THRR to govern intensity when exercising independently;Short Term: Able to use daily as guideline for intensity in rehab Short Term: Able to state/look up THRR;Long Term: Able to use THRR to govern intensity when exercising independently;Short Term: Able to use daily as guideline for intensity in rehab      Understanding of Exercise Prescription Yes Yes  Intervention Provide education, explanation, and written materials on patient's individual exercise prescription Provide education, explanation, and written materials on patient's individual exercise prescription      Expected Outcomes Short Term: Able to explain program exercise prescription;Long Term: Able to explain home exercise prescription to exercise independently Short Term: Able to explain program exercise prescription;Long Term: Able to explain home exercise prescription to exercise independently               Exercise Goals Re-Evaluation :  Exercise Goals Re-Evaluation     Row Name 06/23/22 1432             Exercise Goal Re-Evaluation   Exercise Goals Review Increase Physical Activity;Increase Strength and Stamina;Able to understand and use rate of perceived exertion (RPE) scale;Able to understand and use Dyspnea scale;Knowledge and understanding of Target Heart Rate Range (THRR);Understanding of Exercise Prescription       Comments Pt has completed their first session of PR. He is eager to be in the program and ready to go. He is slightly deconditioned and will progress slowly. His SpO2 dropped (80%) during exercise at 4L so he was increase to 6L. He is currently exercising at 1.9 METs on the stepper. Will continue to monitor and progress as able.       Expected Outcomes Through exercise at rehab and homw, the patient will meet their stated goals,                Discharge Exercise Prescription (Final Exercise Prescription Changes):   Exercise Prescription Changes - 06/22/22 1500       Response to Exercise   Blood Pressure (Admit) 130/52    Blood Pressure (Exercise) 140/60    Blood Pressure (Exit) 136/60    Heart Rate (Admit) 70 bpm    Heart Rate (Exercise) 75 bpm    Heart Rate (Exit) 77 bpm    Oxygen Saturation (Admit) 89 %    Oxygen Saturation (Exercise) 83 %    Oxygen Saturation (Exit) 89 %    Rating of Perceived Exertion (Exercise) 12    Perceived Dyspnea (Exercise) 13    Duration Continue with 30 min of aerobic exercise without signs/symptoms of physical distress.    Intensity THRR unchanged      Progression   Progression Continue to progress workloads to maintain intensity without signs/symptoms of physical distress.      Resistance Training   Training Prescription Yes    Weight 3    Reps 10-15    Time 10 Minutes      Oxygen   Oxygen Continuous    Liters 6      NuStep   Level 1    SPM 83    Minutes 39    METs 1.9             Nutrition:  Target Goals: Understanding of nutrition guidelines, daily intake of sodium 1500mg , cholesterol 200mg , calories 30% from fat and 7% or less from saturated fats, daily to have 5 or more servings of fruits and vegetables.  Biometrics:  Pre Biometrics - 06/10/22 1031       Pre Biometrics   Height 5\' 6"  (1.676 m)    Weight 95.1 kg    Waist Circumference 41 inches    Hip Circumference 45 inches    Waist to Hip Ratio 0.91 %    BMI (Calculated) 33.86    Triceps Skinfold 10 mm    % Body Fat 29.1 %    Grip Strength 27.3 kg  Flexibility 0 in    Single Leg Stand 0 seconds              Nutrition Therapy Plan and Nutrition Goals:  Nutrition Therapy & Goals - 06/10/22 0952       Personal Nutrition Goals   Comments Patient scored 63 on his diet assessment. Handout explained and provided regarding healthier choices. We provide educational information on heart healthy nutrition with handouts and assistance with RD referral if patient is  interested.      Intervention Plan   Intervention Nutrition handout(s) given to patient.    Expected Outcomes Short Term Goal: Understand basic principles of dietary content, such as calories, fat, sodium, cholesterol and nutrients.             Nutrition Assessments:  Nutrition Assessments - 06/10/22 0952       MEDFICTS Scores   Pre Score 63            MEDIFICTS Score Key: ?70 Need to make dietary changes  40-70 Heart Healthy Diet ? 40 Therapeutic Level Cholesterol Diet   Picture Your Plate Scores: <16 Unhealthy dietary pattern with much room for improvement. 41-50 Dietary pattern unlikely to meet recommendations for good health and room for improvement. 51-60 More healthful dietary pattern, with some room for improvement.  >60 Healthy dietary pattern, although there may be some specific behaviors that could be improved.    Nutrition Goals Re-Evaluation:   Nutrition Goals Discharge (Final Nutrition Goals Re-Evaluation):   Psychosocial: Target Goals: Acknowledge presence or absence of significant depression and/or stress, maximize coping skills, provide positive support system. Participant is able to verbalize types and ability to use techniques and skills needed for reducing stress and depression.  Initial Review & Psychosocial Screening:  Initial Psych Review & Screening - 06/10/22 0957       Initial Review   Current issues with None Identified      Family Dynamics   Good Support System? Yes      Barriers   Psychosocial barriers to participate in program There are no identifiable barriers or psychosocial needs.      Screening Interventions   Interventions Encouraged to exercise;To provide support and resources with identified psychosocial needs;Provide feedback about the scores to participant    Expected Outcomes Short Term goal: Utilizing psychosocial counselor, staff and physician to assist with identification of specific Stressors or current issues  interfering with healing process. Setting desired goal for each stressor or current issue identified.             Quality of Life Scores:  Quality of Life - 06/10/22 1155       Quality of Life   Select Quality of Life      Quality of Life Scores   Health/Function Pre 14.06 %    Socioeconomic Pre 22.19 %    Psych/Spiritual Pre 18.93 %    Family Pre 25 %    GLOBAL Pre 18.33 %            Scores of 19 and below usually indicate a poorer quality of life in these areas.  A difference of  2-3 points is a clinically meaningful difference.  A difference of 2-3 points in the total score of the Quality of Life Index has been associated with significant improvement in overall quality of life, self-image, physical symptoms, and general health in studies assessing change in quality of life.   PHQ-9: Review Flowsheet  More data exists  06/10/2022 02/12/2021 11/30/2019 11/29/2018 04/11/2018  Depression screen PHQ 2/9  Decreased Interest 0 0 0 0 0  Down, Depressed, Hopeless 0 0 0 0 0  PHQ - 2 Score 0 0 0 0 0  Altered sleeping 1 - 0 0 -  Tired, decreased energy 1 - 0 0 -  Change in appetite 1 - 0 0 -  Feeling bad or failure about yourself  0 - 0 0 -  Trouble concentrating 0 - 0 0 -  Moving slowly or fidgety/restless 0 - 0 0 -  Suicidal thoughts 0 - 0 0 -  PHQ-9 Score 3 - 0 0 -  Difficult doing work/chores Not difficult at all - Not difficult at all Not difficult at all -   Interpretation of Total Score  Total Score Depression Severity:  1-4 = Minimal depression, 5-9 = Mild depression, 10-14 = Moderate depression, 15-19 = Moderately severe depression, 20-27 = Severe depression   Psychosocial Evaluation and Intervention:  Psychosocial Evaluation - 06/10/22 0958       Psychosocial Evaluation & Interventions   Interventions Stress management education;Relaxation education;Encouraged to exercise with the program and follow exercise prescription    Comments Patient has no  psychosocial barriers or issues identified at his orientation visit. He is accompained by his wife today both very pleasant. 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 He says most of his work is remote. He looks at and approves building plans. He says he plans to retire in the next year but does enjoy his work. He says he has a good support system with his wife of many years and he has a lot of friends that support him. He loves to play golf when he can. 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 He says he is hopeful the program will help him get stronger; improve his walking and breathing and he can get back to his normal activities. He is ready to start the program.    Expected Outcomes Patient will continue to have no psychosocial barriers identified.    Continue Psychosocial Services  No Follow up required             Psychosocial Re-Evaluation:  Psychosocial Re-Evaluation     Row Name 06/14/22 [id number]             Psychosocial Re-Evaluation   Current issues with None Identified       Comments Patient plans to start soon.  Patient referred to PR with Interstitial Lung Disease by Dr. Isaiah Serge.    Attended orientation on  06-10-22.  His personal goals for the program are to breath better and get back to his normal activites.       Expected Outcomes Patient will have psychosocial barriers identified at discharge.       Interventions Encouraged to attend Pulmonary Rehabilitation for the exercise;Relaxation education;Stress management education       Continue Psychosocial Services  No Follow up required                Psychosocial Discharge (Final Psychosocial Re-Evaluation):  Psychosocial Re-Evaluation - 06/14/22 0836       Psychosocial Re-Evaluation   Current issues with None Identified    Comments  Patient plans to start soon.  Patient referred to PR with Interstitial Lung Disease by Dr. Isaiah Serge.  Attended orientation on  06-10-22.  His personal goals for the program are to breath better and get back to his normal activites.    Expected Outcomes Patient will have psychosocial barriers identified at discharge.    Interventions Encouraged to attend Pulmonary Rehabilitation for the exercise;Relaxation education;Stress management education    Continue Psychosocial Services  No Follow up required              Education: Education Goals: Education classes will be provided on a weekly basis, covering required topics. Participant will state understanding/return demonstration of topics presented.  Learning Barriers/Preferences:  Learning Barriers/Preferences - 06/10/22 0953       Learning Barriers/Preferences   Learning Barriers None    Learning Preferences Audio             Education Topics: How Lungs Work and Diseases: - Discuss the anatomy of the lungs and diseases that can affect the lungs, such as COPD.   Exercise: -Discuss the importance of exercise, FITT principles of exercise, normal and abnormal responses to exercise, and how to exercise safely.   Environmental Irritants: -Discuss types of environmental irritants and how to limit exposure to environmental irritants.   Meds/Inhalers and oxygen: - Discuss respiratory medications, definition of an inhaler and oxygen, and the proper way to use an inhaler and oxygen.   Energy Saving Techniques: - Discuss methods to conserve energy and decrease shortness of breath when performing activities of daily living.    Bronchial Hygiene / Breathing Techniques: - Discuss breathing mechanics, pursed-lip breathing technique,  proper posture, effective ways to clear airways, and other functional breathing techniques   Cleaning Equipment: - Provides group verbal and written instruction about the health risks of elevated  stress, cause of high stress, and healthy ways to reduce stress.   Nutrition I: Fats: - Discuss the types of cholesterol, what cholesterol does to the body, and how cholesterol levels can be controlled.   Nutrition II: Labels: -Discuss the different components of food labels and how to read food labels.   Respiratory Infections: - Discuss the signs and symptoms of respiratory infections, ways to prevent respiratory infections, and the importance of seeking medical treatment when having a respiratory infection.   Stress I: Signs and Symptoms: - Discuss the causes of stress, how stress may lead to anxiety and depression, and ways to limit stress.   Stress II: Relaxation: -Discuss relaxation techniques to limit stress.   Oxygen for Home/Travel: - Discuss how to prepare for travel when on oxygen and proper ways to transport and store oxygen to ensure safety.   Knowledge Questionnaire Score:  Knowledge Questionnaire Score - 06/10/22 0953       Knowledge Questionnaire Score   Pre Score 15/18             Core Components/Risk Factors/Patient Goals at Admission:  Personal Goals and Risk Factors at Admission - 06/10/22 0954       Core Components/Risk Factors/Patient Goals on Admission    Weight Management Obesity    Improve shortness of breath with ADL's Yes    Intervention Provide education, individualized exercise plan and daily activity instruction to help decrease symptoms of SOB with activities of daily living.    Expected Outcomes Short Term: Improve cardiorespiratory fitness to achieve a reduction of symptoms when performing ADLs;Long Term: Be able to perform more ADLs without symptoms or delay the onset of symptoms    Increase knowledge of respiratory medications and ability to use respiratory devices properly  Yes  Intervention Provide education and demonstration as needed of appropriate use of medications, inhalers, and oxygen therapy.    Expected Outcomes Short  Term: Achieves understanding of medications use. Understands that oxygen is a medication prescribed by physician. Demonstrates appropriate use of inhaler and oxygen therapy.;Long Term: Maintain appropriate use of medications, inhalers, and oxygen therapy.    Heart Failure Yes    Intervention Provide a combined exercise and nutrition program that is supplemented with education, support and counseling about heart failure. Directed toward relieving symptoms such as shortness of breath, decreased exercise tolerance, and extremity edema.    Expected Outcomes Short term: Attendance in program 2-3 days a week with increased exercise capacity. Reported lower sodium intake. Reported increased fruit and vegetable intake. Reports medication compliance.    Hypertension Yes    Intervention Provide education on lifestyle modifcations including regular physical activity/exercise, weight management, moderate sodium restriction and increased consumption of fresh fruit, vegetables, and low fat dairy, alcohol moderation, and smoking cessation.;Monitor prescription use compliance.    Expected Outcomes Short Term: Continued assessment and intervention until BP is < 140/69mm HG in hypertensive participants. < 130/47mm HG in hypertensive participants with diabetes, heart failure or chronic kidney disease.;Long Term: Maintenance of blood pressure at goal levels.    Personal Goal Other Yes    Personal Goal Patient wants to breathe better and get back to his normal activities.    Intervention Patient will attend PR 2 days/week with exercise and education.    Expected Outcomes Patient will complete the program meeting both personal and program goals.             Core Components/Risk Factors/Patient Goals Review:   Goals and Risk Factor Review     Row Name 06/14/22 0844             Core Components/Risk Factors/Patient Goals Review   Personal Goals Review Improve shortness of breath with ADL's;Develop more efficient  breathing techniques such as purse lipped breathing and diaphragmatic breathing and practicing self-pacing with activity.;Other       Review Patient plans to start soon.  Patient referred to PR with Interstitial Lung Disease by Dr. Isaiah Serge. His personal  goals for the program are to breath better and get back to his normal activities.  We will encourage his progress as he meets these goals.       Expected Outcomes Patient will complete the program meeting both program and peresonal goals.                Core Components/Risk Factors/Patient Goals at Discharge (Final Review):   Goals and Risk Factor Review - 06/14/22 0844       Core Components/Risk Factors/Patient Goals Review   Personal Goals Review Improve shortness of breath with ADL's;Develop more efficient breathing techniques such as purse lipped breathing and diaphragmatic breathing and practicing self-pacing with activity.;Other    Review Patient plans to start soon.  Patient referred to PR with Interstitial Lung Disease by Dr. Isaiah Serge. His personal  goals for the program are to breath better and get back to his normal activities.  We will encourage his progress as he meets these goals.    Expected Outcomes Patient will complete the program meeting both program and peresonal goals.             ITP Comments:   Comments: ITP REVIEW Pt is making expected progress toward pulmonary rehab goals after completing 2 sessions. Recommend continued exercise, life style modification, education, and utilization of breathing techniques  to increase stamina and strength and decrease shortness of breath with exertion.

## 2022-06-24 ENCOUNTER — Encounter (HOSPITAL_COMMUNITY)
Admission: RE | Admit: 2022-06-24 | Discharge: 2022-06-24 | Disposition: A | Discharge status: Home / Self Care | Payer: 59 | Source: Ambulatory Visit | Attending: Pulmonary Disease | Admitting: Pulmonary Disease

## 2022-06-24 DIAGNOSIS — J849 Interstitial pulmonary disease, unspecified: Secondary | ICD-10-CM

## 2022-06-24 LAB — GLUCOSE, CAPILLARY: Glucose-Capillary: 140 mg/dL — ABNORMAL HIGH (ref 70–99)

## 2022-06-24 NOTE — Progress Notes (Signed)
Daily Session Note  Patient Details  Name: [name] MRN: [id number] Date of Birth: 06-08-1950 Referring Provider:   Flowsheet Row PULMONARY REHAB OTHER RESP ORIENTATION from 06/10/2022 in [facility] CARDIAC REHABILITATION  Referring Provider Dr. Isaiah Serge       Encounter Date: 06/24/2022  Check In:  Session Check In - 06/24/22 1451       Check-In   Supervising physician immediately available to respond to emergencies [facility] MD immediately available    Physician(s) Dr. Rennis Golden    Location [facility]    Staff Present Ross Ludwig, BS, Exercise Physiologist;Allen Egerton Laural Benes, RN, Pleas Koch, RN, BSN    Virtual Visit No    Medication changes reported     No    Fall or balance concerns reported    Yes    Comments Walks with a walker and gait is unsteady.    Tobacco Cessation No Change    Warm-up and Cool-down Performed as group-led instruction    Resistance Training Performed Yes    VAD Patient? No    PAD/SET Patient? No      Pain Assessment   Currently in Pain? No/denies    Pain Score 0-No pain    Multiple Pain Sites No             Capillary Blood Glucose: Results for orders placed or performed during the hospital encounter of 06/24/22 (from the past 24 hour(s))  Glucose, capillary     Status: Abnormal   Collection Time: 06/24/22  2:49 PM  Result Value Ref Range   Glucose-Capillary 140 (H) 70 - 99 mg/dL      Social History   Tobacco Use  Smoking Status Never   Passive exposure: Never  Smokeless Tobacco Never    Goals Met:  Proper associated with RPD/PD & O2 Sat Independence with exercise equipment Using PLB without cueing & demonstrates good technique Exercise tolerated well No report of concerns or symptoms today Strength training completed today  Goals Unmet:  Not Applicable  Comments: Check out 1600.   Dr. Erick Blinks is Medical Director for [facility] Pulmonary Rehab.

## 2022-06-25 ENCOUNTER — Ambulatory Visit: Payer: 59 | Admitting: Pulmonary Disease

## 2022-06-29 ENCOUNTER — Encounter (HOSPITAL_COMMUNITY)
Admission: RE | Admit: 2022-06-29 | Discharge: 2022-06-29 | Disposition: A | Discharge status: Home / Self Care | Payer: 59 | Source: Ambulatory Visit | Attending: Pulmonary Disease | Admitting: Pulmonary Disease

## 2022-06-29 DIAGNOSIS — J849 Interstitial pulmonary disease, unspecified: Secondary | ICD-10-CM

## 2022-06-29 NOTE — Progress Notes (Signed)
Daily Session Note  Patient Details  Name: [name] MRN: [id number] Date of Birth: Dec 17, 1950 Referring Provider:   Flowsheet Row PULMONARY REHAB OTHER RESP ORIENTATION from 06/10/2022 in [facility] CARDIAC REHABILITATION  Referring Provider Dr. Isaiah Serge       Encounter Date: 06/29/2022  Check In:  Session Check In - 06/29/22 1506       Check-In   Supervising physician immediately available to respond to emergencies [facility] MD immediately available    Physician(s) Dr Wyline Mood    Location [facility]    Staff Present Ross Ludwig, BS, Exercise Physiologist;Xaviar Lunn Daphine Deutscher, RN, BSN    Virtual Visit No    Medication changes reported     No    Fall or balance concerns reported    Yes    Comments Walks with a walker and gait is unsteady.    Warm-up and Cool-down Not performed (comment)   Pt arrived at 1506.   Resistance Training Performed No      Pain Assessment   Pain Score 0-No pain             Capillary Blood Glucose: No results found for this or any previous visit (from the past 24 hour(s)).    Social History   Tobacco Use  Smoking Status Never   Passive exposure: Never  Smokeless Tobacco Never    Goals Met:  Proper associated with RPD/PD & O2 Sat Independence with exercise equipment Using PLB without cueing & demonstrates good technique Exercise tolerated well Queuing for purse lip breathing No report of concerns or symptoms today Strength training completed today  Goals Unmet:  Not Applicable  Comments: Checkout at 1630.  Pt arrived at 1506.   Dr. Erick Blinks is Medical Director for [facility] Pulmonary Rehab.

## 2022-07-01 ENCOUNTER — Encounter (HOSPITAL_COMMUNITY)
Admission: RE | Admit: 2022-07-01 | Discharge: 2022-07-01 | Disposition: A | Discharge status: Home / Self Care | Payer: 59 | Source: Ambulatory Visit | Attending: Pulmonary Disease | Admitting: Pulmonary Disease

## 2022-07-01 DIAGNOSIS — J849 Interstitial pulmonary disease, unspecified: Secondary | ICD-10-CM

## 2022-07-01 NOTE — Progress Notes (Signed)
Daily Session Note  Patient Details  Name: [name] MRN: [id number] Date of Birth: [birth year] Referring Provider:   Flowsheet Row PULMONARY REHAB OTHER RESP ORIENTATION from 06/10/2022 in [facility] CARDIAC REHABILITATION  Referring Provider Dr. Isaiah Serge       Encounter Date: 07/01/2022  Check In:  Session Check In - 07/01/22 1454       Check-In   Supervising physician immediately available to respond to emergencies [facility] MD immediately available    Physician(s) Dr. Dina Rich    Location [facility]    Staff Present Rodena Medin, RN, Pleas Koch, RN, BSN;Dalton Fletcher MHA, MS, ACSM-CEP    Virtual Visit No    Medication changes reported     No    Fall or balance concerns reported    Yes    Comments Walks with a walker and gait is unsteady.    Tobacco Cessation No Change    Warm-up and Cool-down Performed as group-led instruction    VAD Patient? No    PAD/SET Patient? No      Pain Assessment   Currently in Pain? No/denies    Multiple Pain Sites No             Capillary Blood Glucose: No results found for this or any previous visit (from the past 24 hour(s)).    Social History   Tobacco Use  Smoking Status Never   Passive exposure: Never  Smokeless Tobacco Never    Goals Met:  Proper associated with RPD/PD & O2 Sat Independence with exercise equipment Using PLB without cueing & demonstrates good technique Exercise tolerated well No report of concerns or symptoms today Strength training completed today  Goals Unmet:  Not Applicable  Comments: Check out 1600.   Dr. Erick Blinks is Medical Director for [facility] Pulmonary Rehab.

## 2022-07-05 ENCOUNTER — Other Ambulatory Visit: Payer: Self-pay | Admitting: Cardiology

## 2022-07-06 ENCOUNTER — Encounter (HOSPITAL_COMMUNITY): Payer: 59

## 2022-07-08 ENCOUNTER — Encounter (HOSPITAL_COMMUNITY)
Admission: RE | Admit: 2022-07-08 | Discharge: 2022-07-08 | Disposition: A | Discharge status: Home / Self Care | Payer: 59 | Source: Ambulatory Visit | Attending: Pulmonary Disease | Admitting: Pulmonary Disease

## 2022-07-08 DIAGNOSIS — J849 Interstitial pulmonary disease, unspecified: Secondary | ICD-10-CM | POA: Insufficient documentation

## 2022-07-08 IMAGING — DX DG CHEST 2V
2 series · 2 of 2 positions shown · non-contrast
Comparison: Chest radiograph 02/12/2021.

CLINICAL DATA: Cough

EXAM:
CHEST - 2 VIEW

[chest pa]
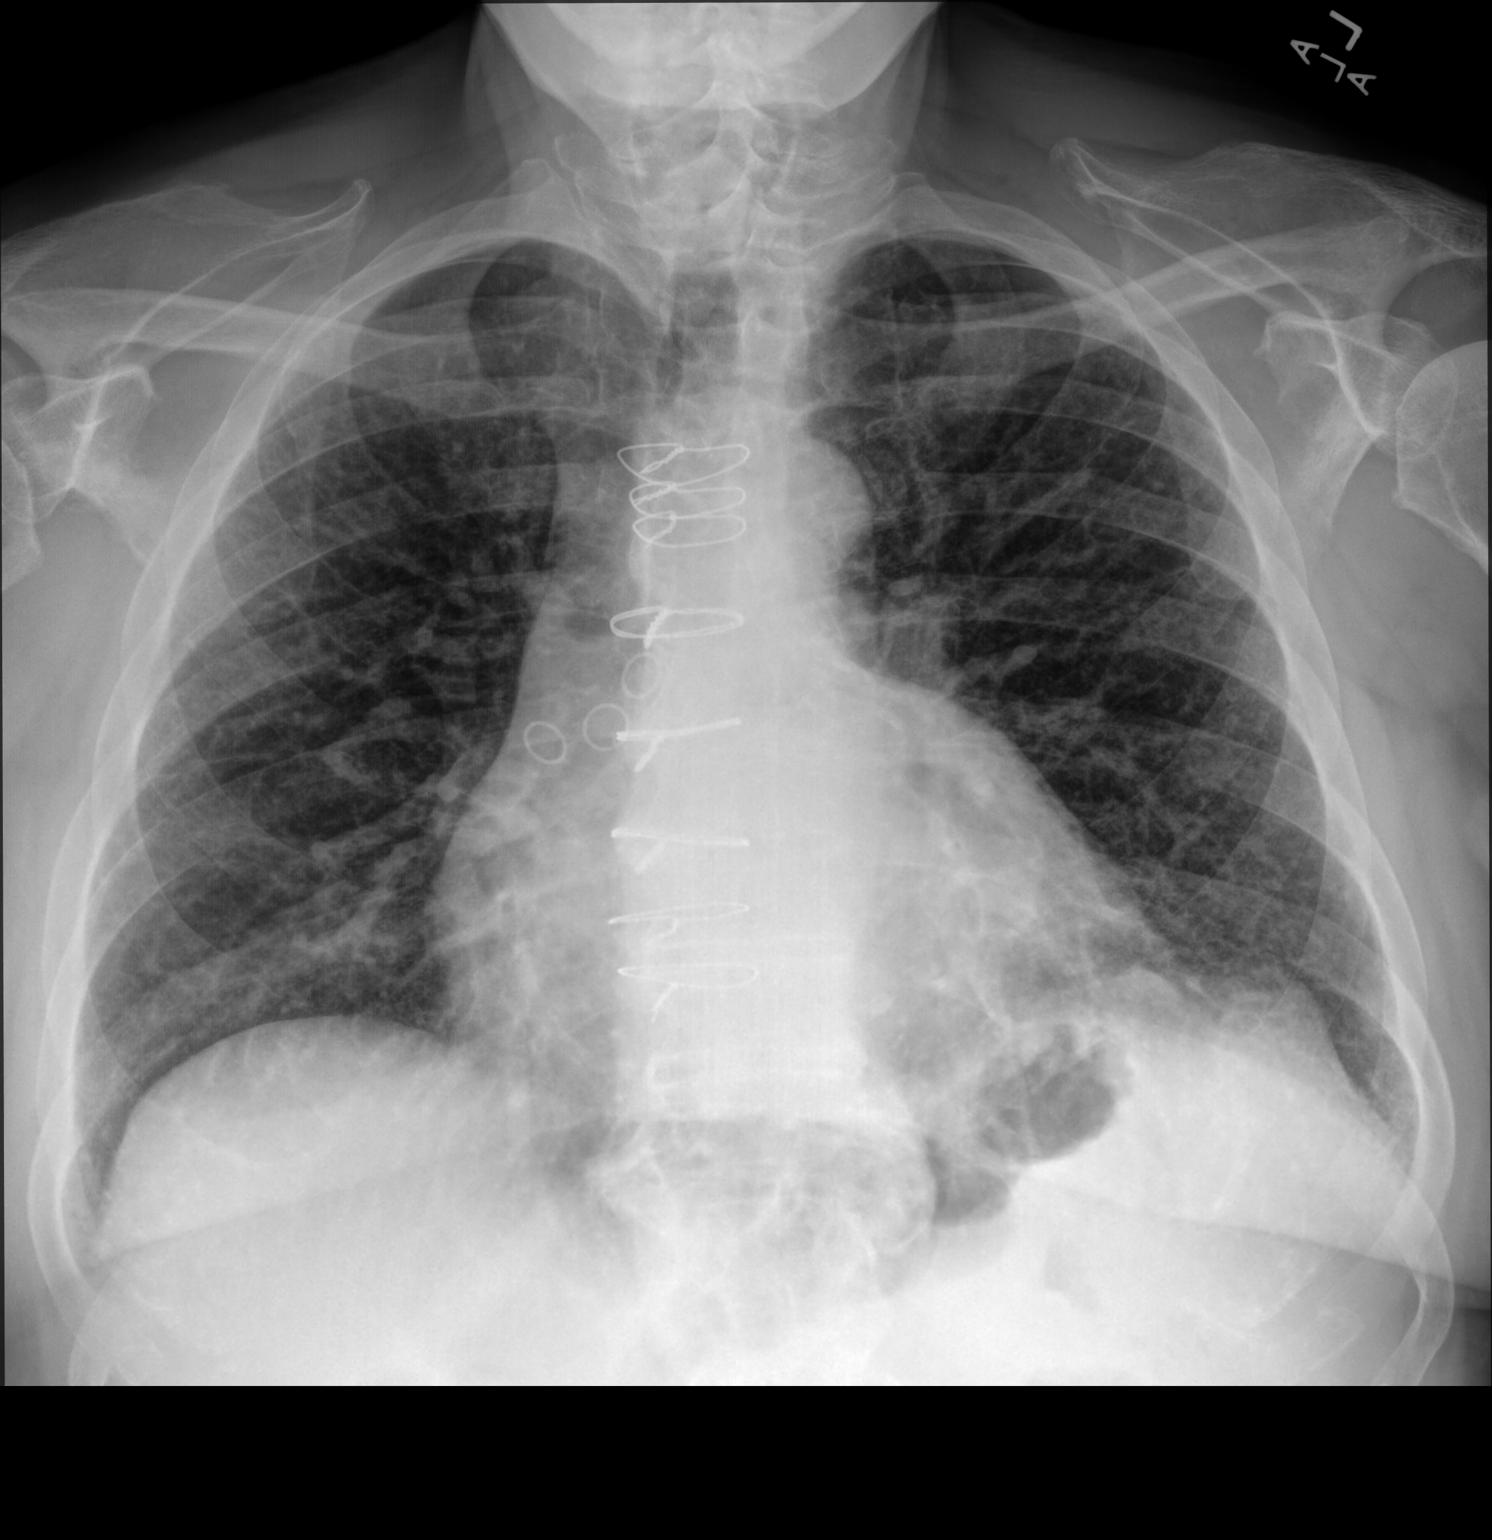

[chest lat]
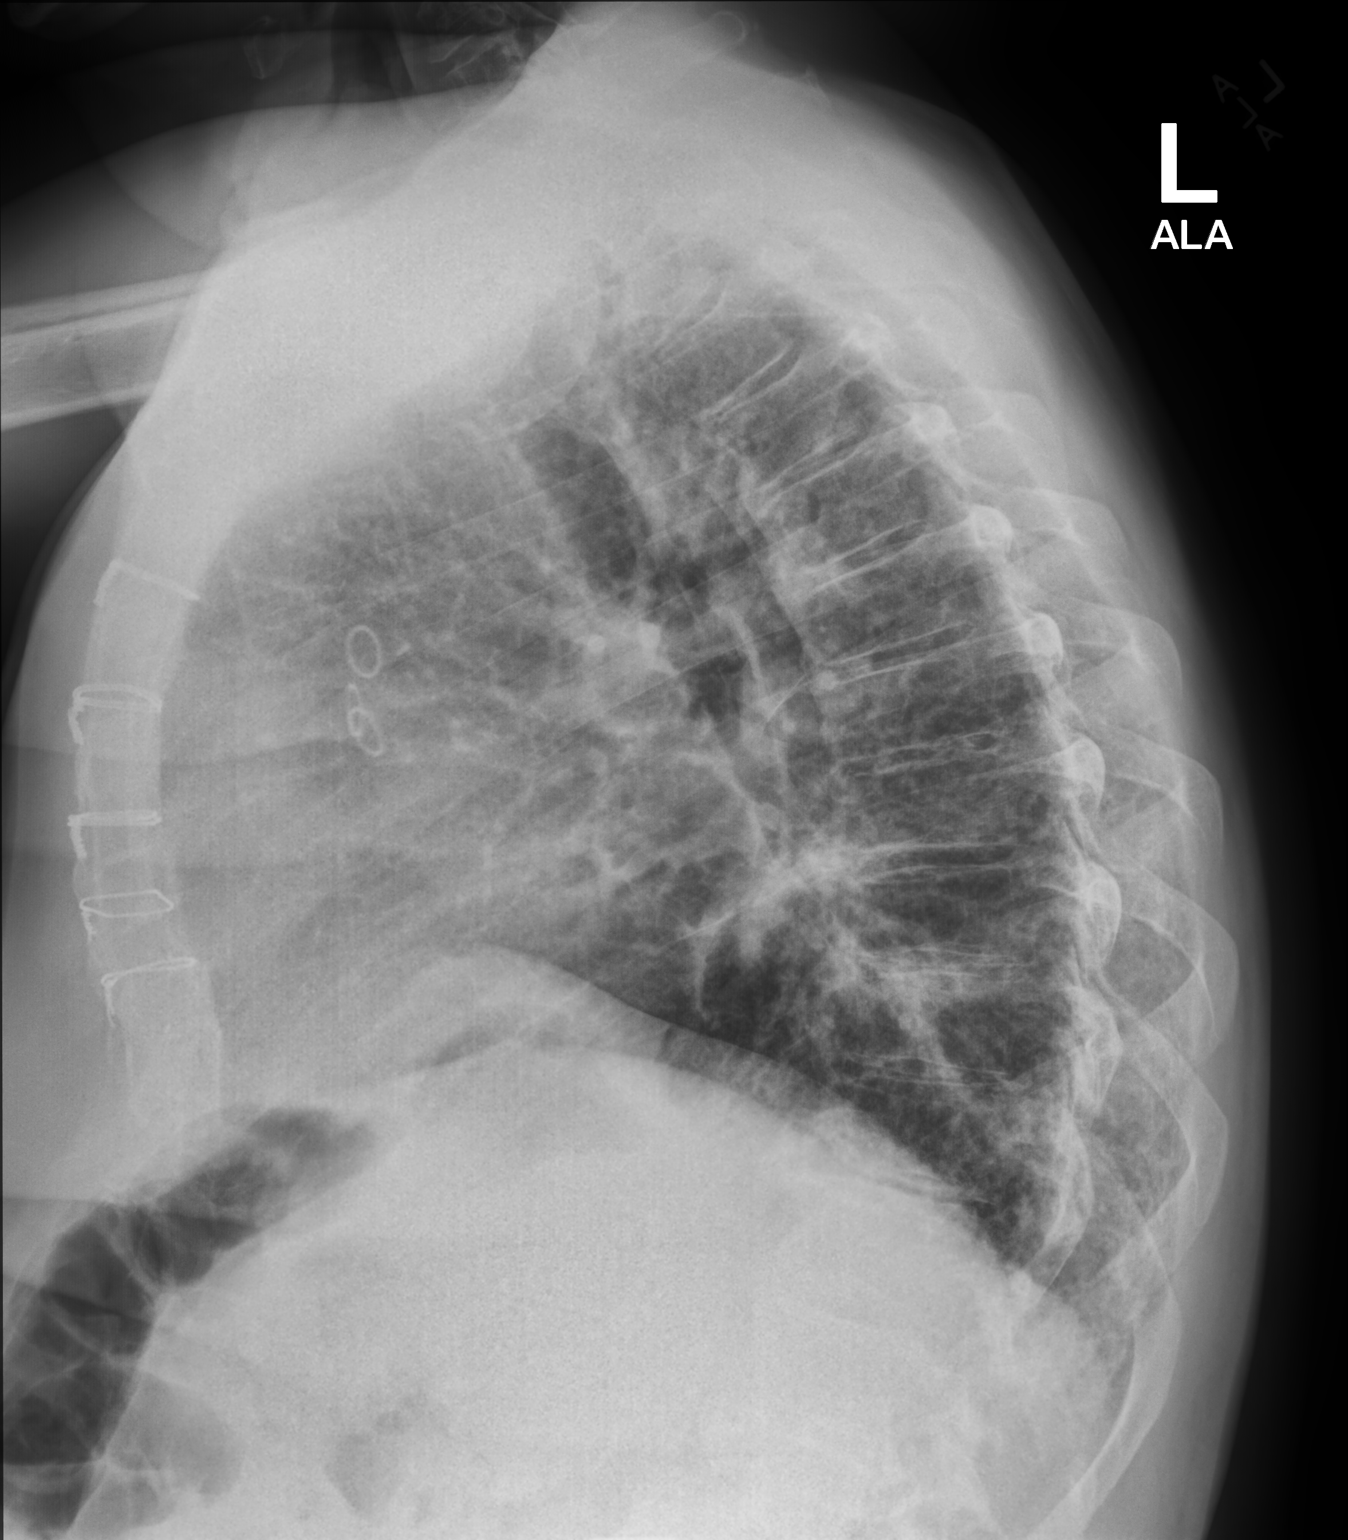

[2 of 2 positions shown; findings below may reference images not displayed]

FINDINGS: Stable cardiac and mediastinal contours. Low lung volumes. On the
lateral view there is persistent reticular opacification and
consolidative opacities. No pleural effusion or pneumothorax.
Osseous structures unremarkable.
IMPRESSION: Persistent lower lung irregular areas of consolidation demonstrated
best on the lateral view. Given the persistent nature of these
findings, recommend further evaluation with chest CT.

## 2022-07-08 NOTE — Progress Notes (Signed)
Daily Session Note  Patient Details  Name: [name] MRN: [id number] Date of Birth: [birth year] Referring Provider:   Flowsheet Row PULMONARY REHAB OTHER RESP ORIENTATION from 06/10/2022 in [facility] CARDIAC REHABILITATION  Referring Provider Dr. Isaiah Serge       Encounter Date: 07/08/2022  Check In:  Session Check In - 07/08/22 1445       Check-In   Supervising physician immediately available to respond to emergencies [facility] MD immediately available    Physician(s) Dr Jenene Slicker    Location [facility]    Staff Present Ross Ludwig, BS, Exercise Physiologist;Duanne Duchesne Daphine Deutscher, RN, BSN    Virtual Visit No    Medication changes reported     No    Fall or balance concerns reported    Yes    Comments Walks with a walker and gait is unsteady.    Tobacco Cessation No Change    Warm-up and Cool-down Performed as group-led instruction    Resistance Training Performed Yes      Pain Assessment   Currently in Pain? No/denies    Pain Score 0-No pain             Capillary Blood Glucose: No results found for this or any previous visit (from the past 24 hour(s)).    Social History   Tobacco Use  Smoking Status Never   Passive exposure: Never  Smokeless Tobacco Never    Goals Met:  Proper associated with RPD/PD & O2 Sat Independence with exercise equipment Using PLB without cueing & demonstrates good technique Exercise tolerated well Queuing for purse lip breathing No report of concerns or symptoms today Strength training completed today  Goals Unmet:  Not Applicable  Comments: Checkout at 1600.   Dr. Erick Blinks is Medical Director for [facility] Pulmonary Rehab.

## 2022-07-13 ENCOUNTER — Encounter (HOSPITAL_COMMUNITY): Payer: 59

## 2022-07-15 ENCOUNTER — Encounter (HOSPITAL_COMMUNITY): Payer: 59

## 2022-07-16 ENCOUNTER — Emergency Department (HOSPITAL_COMMUNITY): Payer: 59

## 2022-07-16 ENCOUNTER — Other Ambulatory Visit: Payer: Self-pay

## 2022-07-16 ENCOUNTER — Emergency Department (HOSPITAL_COMMUNITY)
Admission: EM | Admit: 2022-07-16 | Discharge: 2022-07-17 | Disposition: A | Discharge status: Home / Self Care | Payer: 59 | Attending: Emergency Medicine | Admitting: Emergency Medicine

## 2022-07-16 DIAGNOSIS — I5042 Chronic combined systolic (congestive) and diastolic (congestive) heart failure: Secondary | ICD-10-CM | POA: Diagnosis not present

## 2022-07-16 DIAGNOSIS — Z20822 Contact with and (suspected) exposure to covid-19: Secondary | ICD-10-CM | POA: Insufficient documentation

## 2022-07-16 DIAGNOSIS — E872 Acidosis, unspecified: Secondary | ICD-10-CM | POA: Insufficient documentation

## 2022-07-16 DIAGNOSIS — Z7982 Long term (current) use of aspirin: Secondary | ICD-10-CM | POA: Insufficient documentation

## 2022-07-16 DIAGNOSIS — N189 Chronic kidney disease, unspecified: Secondary | ICD-10-CM | POA: Insufficient documentation

## 2022-07-16 DIAGNOSIS — I2510 Atherosclerotic heart disease of native coronary artery without angina pectoris: Secondary | ICD-10-CM | POA: Diagnosis not present

## 2022-07-16 DIAGNOSIS — F10920 Alcohol use, unspecified with intoxication, uncomplicated: Secondary | ICD-10-CM

## 2022-07-16 DIAGNOSIS — R531 Weakness: Secondary | ICD-10-CM | POA: Diagnosis present

## 2022-07-16 DIAGNOSIS — Z7984 Long term (current) use of oral hypoglycemic drugs: Secondary | ICD-10-CM | POA: Insufficient documentation

## 2022-07-16 DIAGNOSIS — I130 Hypertensive heart and chronic kidney disease with heart failure and stage 1 through stage 4 chronic kidney disease, or unspecified chronic kidney disease: Secondary | ICD-10-CM | POA: Diagnosis not present

## 2022-07-16 DIAGNOSIS — F10220 Alcohol dependence with intoxication, uncomplicated: Secondary | ICD-10-CM | POA: Diagnosis not present

## 2022-07-16 LAB — CBC WITH DIFFERENTIAL/PLATELET
Abs Immature Granulocytes: 0.03 10*3/uL (ref 0.00–0.07)
Basophils Absolute: 0 10*3/uL (ref 0.0–0.1)
Basophils Relative: 1 %
Eosinophils Absolute: 0.4 10*3/uL (ref 0.0–0.5)
Eosinophils Relative: 7 %
HCT: 33.6 % — ABNORMAL LOW (ref 39.0–52.0)
Hemoglobin: 11 g/dL — ABNORMAL LOW (ref 13.0–17.0)
Immature Granulocytes: 1 %
Lymphocytes Relative: 32 %
Lymphs Abs: 2.1 10*3/uL (ref 0.7–4.0)
MCH: 31.9 pg (ref 26.0–34.0)
MCHC: 32.7 g/dL (ref 30.0–36.0)
MCV: 97.4 fL (ref 80.0–100.0)
Monocytes Absolute: 0.5 10*3/uL (ref 0.1–1.0)
Monocytes Relative: 7 %
Neutro Abs: 3.4 10*3/uL (ref 1.7–7.7)
Neutrophils Relative %: 52 %
Platelets: 58 10*3/uL — ABNORMAL LOW (ref 150–400)
RBC: 3.45 MIL/uL — ABNORMAL LOW (ref 4.22–5.81)
RDW: 17.5 % — ABNORMAL HIGH (ref 11.5–15.5)
WBC: 6.5 10*3/uL (ref 4.0–10.5)
nRBC: 0 % (ref 0.0–0.2)

## 2022-07-16 LAB — RESP PANEL BY RT-PCR (RSV, FLU A&B, COVID)  RVPGX2
Influenza A by PCR: NEGATIVE
Influenza B by PCR: NEGATIVE
Resp Syncytial Virus by PCR: NEGATIVE
SARS Coronavirus 2 by RT PCR: NEGATIVE

## 2022-07-16 LAB — COMPREHENSIVE METABOLIC PANEL
ALT: 17 U/L (ref 0–44)
AST: 33 U/L (ref 15–41)
Albumin: 2.9 g/dL — ABNORMAL LOW (ref 3.5–5.0)
Alkaline Phosphatase: 74 U/L (ref 38–126)
Anion gap: 9 (ref 5–15)
BUN: 15 mg/dL (ref 8–23)
CO2: 22 mmol/L (ref 22–32)
Calcium: 8.1 mg/dL — ABNORMAL LOW (ref 8.9–10.3)
Chloride: 107 mmol/L (ref 98–111)
Creatinine, Ser: 0.95 mg/dL (ref 0.61–1.24)
GFR, Estimated: >60 mL/min (ref >60)
Glucose, Bld: 164 mg/dL — ABNORMAL HIGH (ref 70–99)
Potassium: 2.9 mmol/L — ABNORMAL LOW (ref 3.5–5.1)
Sodium: 138 mmol/L (ref 135–145)
Total Bilirubin: 2.2 mg/dL — ABNORMAL HIGH (ref 0.3–1.2)
Total Protein: 6.2 g/dL — ABNORMAL LOW (ref 6.5–8.1)

## 2022-07-16 LAB — BRAIN NATRIURETIC PEPTIDE: B Natriuretic Peptide: 521 pg/mL — ABNORMAL HIGH (ref 0.0–100.0)

## 2022-07-16 LAB — BLOOD GAS, VENOUS
Acid-Base Excess: 0.8 mmol/L (ref 0.0–2.0)
Bicarbonate: 25.3 mmol/L (ref 20.0–28.0)
Drawn by: 7049
O2 Saturation: 94.2 %
Patient temperature: 36.1
pCO2, Ven: 38 mmHg — ABNORMAL LOW (ref 44–60)
pH, Ven: 7.43 (ref 7.25–7.43)
pO2, Ven: 65 mmHg — ABNORMAL HIGH (ref 32–45)

## 2022-07-16 LAB — AMMONIA: Ammonia: 61 umol/L — ABNORMAL HIGH (ref 9–35)

## 2022-07-16 LAB — PROTIME-INR
INR: 1.7 — ABNORMAL HIGH (ref 0.8–1.2)
Prothrombin Time: 20.2 seconds — ABNORMAL HIGH (ref 11.4–15.2)

## 2022-07-16 LAB — ETHANOL: Alcohol, Ethyl (B): 220 mg/dL — ABNORMAL HIGH (ref <10)

## 2022-07-16 LAB — TSH: TSH: 3.906 u[IU]/mL (ref 0.350–4.500)

## 2022-07-16 LAB — TROPONIN I (HIGH SENSITIVITY)
Troponin I (High Sensitivity): 19 ng/L — ABNORMAL HIGH (ref <18)
Troponin I (High Sensitivity): 20 ng/L — ABNORMAL HIGH (ref <18)

## 2022-07-16 LAB — LACTIC ACID, PLASMA
Lactic Acid, Venous: 2.6 mmol/L (ref 0.5–1.9)
Lactic Acid, Venous: 2.5 mmol/L (ref 0.5–1.9)

## 2022-07-16 LAB — CBG MONITORING, ED: Glucose-Capillary: 156 mg/dL — ABNORMAL HIGH (ref 70–99)

## 2022-07-16 MED ORDER — POTASSIUM CHLORIDE CRYS ER 20 MEQ PO TBCR
40.0000 meq | EXTENDED_RELEASE_TABLET | Freq: Once | ORAL | Status: AC
  Administered 2022-07-16: 40 meq via ORAL
  Filled 2022-07-16: qty 2

## 2022-07-16 MED ORDER — SODIUM CHLORIDE 0.9 % IV BOLUS
500.0000 mL | Freq: Once | INTRAVENOUS | Status: AC
  Administered 2022-07-16: 500 mL via INTRAVENOUS

## 2022-07-16 MED ORDER — FUROSEMIDE 10 MG/ML IJ SOLN
60.0000 mg | Freq: Once | INTRAMUSCULAR | Status: AC
  Administered 2022-07-16: 60 mg via INTRAVENOUS
  Filled 2022-07-16: qty 6

## 2022-07-16 NOTE — ED Triage Notes (Signed)
Pt to ED from home accompanied by s/o. She states he had a "spell" at home where he was unresponsive but still conscious that occurred 1 hour pta. Pt responsive to pain on arrival to ED. Hx of pulmonary fibrosis and is on 5l [place] chronic. 93% on 6l currently. Alert to self and place.

## 2022-07-16 NOTE — ED Provider Notes (Signed)
[facility] AT [facility] Provider Note  CSN: [id number] Arrival date & time: 07/16/22 2017  Chief Complaint(s) Weakness  HPI GRYPHON MAUTE is a 72 y.o. male history of pulmonary fibrosis, coronary artery disease, CHF, on chronic home oxygen, cirrhosis presenting to the emergency department with altered mental status.  Patient was brought in by his wife who reported that she found him standing in the hallway confused.  Had otherwise seems to be at baseline previous to this episode.  No recent fevers or chills, chest pain or abdominal pain.  Has chronic leg swelling which is unchanged.  Is on Lasix.  Has been compliant.  No coughing.  Had been complaining to wife about some painful urination although this is apparently also chronic.  While in the emergency department the patient ultimately admitted that he had drink a lot of liquor.  He reports half a bottle of liquor.  He reports that he thought it would make him feel better.   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 Excell Seltzer and Dr. 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   Hypoxia 12/18/2021   Pulmonary fibrosis (HCC) 12/18/2021   PAF (paroxysmal atrial fibrillation) (HCC) 12/17/2021   Presence of Watchman left atrial appendage closure device 12/17/2021   CAP (community acquired pneumonia) 02/16/2021   Benign neoplasm of colon    Hepatic cirrhosis (HCC)    Gastritis and gastroduodenitis    Esophageal varices without bleeding (HCC)    Atrial flutter (HCC)    Atypical atrial flutter (HCC)    Severe aortic stenosis 10/11/2017   Murmur 06/26/2015   History of colon polyps 09/20/2013   Impaired glucose tolerance 09/20/2013   Exogenous obesity 09/20/2013   Thrombocytopenia, unspecified (HCC) 09/20/2013   Bruit 08/28/2013   Chronic combined systolic and diastolic heart failure (HCC) 12/09/2008   DYSPNEA 12/09/2008   OBESITY 07/19/2008   WEIGHT GAIN 07/15/2008   ADJUSTMENT DISORDER WITH DEPRESSED MOOD 09/07/2007   CELLULITIS, RIGHT LEG 07/04/2007   Dyslipidemia 08/09/2006   Essential hypertension 08/09/2006   MYOCARDIAL INFARCTION, HX OF 08/09/2006   Coronary atherosclerosis 08/09/2006   NEPHROLITHIASIS, HX OF 08/09/2006   Home Medication(s) Prior to Admission medications   Medication Sig Start Date End Date Taking? Authorizing Provider  acetaminophen (TYLENOL) 500 MG tablet Take 1,000 mg by mouth daily as needed for moderate pain or headache.    [provider]  amoxicillin (AMOXIL) 500 MG tablet Take 4 tablets (2,000 mg total) by mouth as directed. 1 HOUR PRIOR TO DENTAL APPOINTMENTS 03/29/22   Georgie Chard D, NP  aspirin EC 81 MG tablet Take 1 tablet (81 mg total) by mouth daily. Swallow whole. 03/22/22   Tereso Newcomer T, PA-C  atorvastatin (LIPITOR) 80 MG tablet TAKE 1 TABLET BY MOUTH EVERY DAY 04/26/22   Philip Aspen, Limmie Patricia, MD   carvedilol (COREG) 3.125 MG tablet TAKE 1 TABLET BY MOUTH TWICE A DAY WITH FOOD 05/03/22   Armbruster, Willaim Rayas, MD  [employer] VITAMIN B12 1000 MCG tablet TAKE 1 TABLET BY MOUTH EVERY DAY 01/23/22   Jaci Standard, MD  JARDIANCE 10 MG TABS tablet TAKE 1 TABLET BY MOUTH DAILY BEFORE BREAKFAST. 07/05/22   Lewayne Bunting, MD  KLOR-CON M20 20 MEQ tablet TAKE 1 TABLET BY MOUTH EVERY DAY 12/29/21   Lewayne Bunting, MD  Misc Natural Products (LIVER PROTECT PO) Take 1 capsule by mouth 2 (two) times daily. From [employer]    [provider]  OXYGEN Inhale into the lungs.    [provider]  Pirfenidone 267 MG TABS Take 3 tablets (801 mg total) by mouth 3 (three) times daily with meals. 05/18/22   Mannam, Colbert Coyer, MD  tamsulosin (FLOMAX) 0.4 MG CAPS capsule TAKE ONE CAPSULE BY MOUTH EVERY DAY AFTER SUPPER 04/26/22   Philip Aspen, Limmie Patricia, MD  torsemide 60 MG TABS Take 60 mg by mouth daily. If your weight increases to 205lb or more, take an extra 60mg  of torsemide. 05/11/22   Croitoru, Rachelle Hora, MD                                                                                                                                    Past Surgical History Past Surgical History:  Procedure Laterality Date   BIOPSY  11/18/2020   Procedure: BIOPSY;  Surgeon: Benancio Deeds, MD;  Location: [facility];  Service: Gastroenterology;;   CARDIOVERSION N/A 12/14/2017   Procedure: CARDIOVERSION;  Surgeon: Chilton Si, MD;  Location: [facility] ENDOSCOPY;  Service: Cardiovascular;  Laterality: N/A;   CARDIOVERSION N/A 10/01/2019   Procedure: CARDIOVERSION;  Surgeon: Jodelle Red, MD;  Location: [facility] ENDOSCOPY;  Service: Cardiovascular;  Laterality: N/A;   COLONOSCOPY     COLONOSCOPY WITH PROPOFOL N/A 11/18/2020   Procedure: COLONOSCOPY WITH PROPOFOL;  Surgeon: Benancio Deeds, MD;  Location: [facility] ENDOSCOPY;  Service: Gastroenterology;  Laterality: N/A;   CORONARY ARTERY BYPASS GRAFT   2006   ESOPHAGOGASTRODUODENOSCOPY (EGD) WITH PROPOFOL N/A 11/18/2020   Procedure: ESOPHAGOGASTRODUODENOSCOPY (EGD) WITH PROPOFOL;  Surgeon: Benancio Deeds, MD;  Location: [facility] ENDOSCOPY;  Service: Gastroenterology;  Laterality: N/A;   INTRAOPERATIVE TRANSTHORACIC ECHOCARDIOGRAM N/A 03/16/2022   Procedure: INTRAOPERATIVE TRANSTHORACIC ECHOCARDIOGRAM;  Surgeon: Tonny Bollman, MD;  Location: [facility] INVASIVE CV LAB;  Service: Open Heart Surgery;  Laterality: N/A;   LEFT ATRIAL APPENDAGE OCCLUSION  N/A 12/17/2021   Procedure: LEFT ATRIAL APPENDAGE OCCLUSION;  Surgeon: Tonny Bollman, MD;  Location: [facility] INVASIVE CV LAB;  Service: Cardiovascular;  Laterality: N/A;   POLYPECTOMY     POLYPECTOMY  11/18/2020   Procedure: POLYPECTOMY;  Surgeon: Benancio Deeds, MD;  Location: [facility] ENDOSCOPY;  Service: Gastroenterology;;   RIGHT/LEFT HEART CATH AND CORONARY/GRAFT ANGIOGRAPHY N/A 02/17/2022   Procedure: RIGHT/LEFT HEART CATH AND CORONARY/GRAFT ANGIOGRAPHY;  Surgeon: Tonny Bollman, MD;  Location: [facility] INVASIVE CV LAB;  Service: Cardiovascular;  Laterality: N/A;   TEE WITHOUT CARDIOVERSION N/A 12/17/2021   Procedure: TRANSESOPHAGEAL ECHOCARDIOGRAM (TEE);  Surgeon: Tonny Bollman, MD;  Location: [facility] INVASIVE CV LAB;  Service: Cardiovascular;  Laterality: N/A;   TRANSCATHETER AORTIC VALVE REPLACEMENT, TRANSFEMORAL Right 03/16/2022   Procedure: Transcatheter Aortic Valve Replacement, Transfemoral;  Surgeon: Tonny Bollman, MD;  Location: [facility] INVASIVE CV LAB;  Service: Open Heart Surgery;  Laterality: Right;   Family History Family History  Problem Relation Age of Onset   Hypertension Mother    Hyperlipidemia Mother    Heart disease Father    Ulcers Father    Bipolar disorder Brother    Colon cancer Neg Hx    Colon polyps Neg Hx    Esophageal cancer Neg Hx    Rectal cancer Neg Hx    Stomach cancer Neg Hx     Social History Social History   Tobacco Use   Smoking status: Never    Passive exposure:  Never   Smokeless tobacco: Never  Vaping Use   Vaping Use: Never used  Substance Use Topics   Alcohol use: Not Currently    Comment: occasional   Drug use: No   Allergies Patient has no known allergies.  Review of Systems Review of Systems  All other systems reviewed and are negative.   Physical Exam Vital Signs  I have reviewed the triage vital signs BP (!) 112/49   Pulse 63   Temp (!) 97 F (36.1 C) (Rectal)   Resp 15   Ht 5\' 6"  (1.676 m)   Wt 99.3 kg   SpO2 90%   BMI 35.35 kg/m  Physical Exam Vitals and nursing note reviewed.  Constitutional:      General: He is not in acute distress.    Comments: Somnolent appearance  HENT:     Mouth/Throat:     Mouth: Mucous membranes are moist.  Eyes:     Conjunctiva/sclera: Conjunctivae normal.  Cardiovascular:     Rate and Rhythm: Normal rate and regular rhythm.  Pulmonary:     Effort: Pulmonary effort is normal. No respiratory distress.     Breath sounds: Rales (b/l diffuse) present.  Abdominal:     General: Abdomen is flat.     Palpations: Abdomen is soft.     Tenderness: There is no abdominal tenderness.  Musculoskeletal:     Right lower leg: Edema present.     Left lower leg: Edema present.  Skin:    General: Skin is warm and dry.     Capillary Refill: Capillary refill takes less than 2 seconds.  Neurological:     Mental Status: He is oriented to person, place, and time. Mental status is at baseline.     Comments: Slow to respond  Psychiatric:        Mood and Affect: Mood normal.        Behavior: Behavior normal.     ED Results and Treatments Labs (all labs ordered are listed, but only abnormal results are displayed)  Labs Reviewed  COMPREHENSIVE METABOLIC PANEL - Abnormal; Notable for the following components:      Result Value   Potassium 2.9 (*)    Glucose, Bld 164 (*)    Calcium 8.1 (*)    Total Protein 6.2 (*)    Albumin 2.9 (*)    Total Bilirubin 2.2 (*)    All other components within normal  limits  CBC WITH DIFFERENTIAL/PLATELET - Abnormal; Notable for the following components:   RBC 3.45 (*)    Hemoglobin 11.0 (*)    HCT 33.6 (*)    RDW 17.5 (*)    Platelets 58 (*)    All other components within normal limits  BLOOD GAS, VENOUS - Abnormal; Notable for the following components:   pCO2, Ven 38 (*)    pO2, Ven 65 (*)    All other components within normal limits  LACTIC ACID, PLASMA - Abnormal; Notable for the following components:   Lactic Acid, Venous 2.6 (*)    All other components within normal limits  AMMONIA - Abnormal; Notable for the following components:   Ammonia 61 (*)    All other components within normal limits  PROTIME-INR - Abnormal; Notable for the following components:   Prothrombin Time 20.2 (*)    INR 1.7 (*)    All other components within normal limits  BRAIN NATRIURETIC PEPTIDE - Abnormal; Notable for the following components:   B Natriuretic Peptide 521.0 (*)    All other components within normal limits  ETHANOL - Abnormal; Notable for the following components:   Alcohol, Ethyl (B) 220 (*)    All other components within normal limits  CBG MONITORING, ED - Abnormal; Notable for the following components:   Glucose-Capillary 156 (*)    All other components within normal limits  TROPONIN I (HIGH SENSITIVITY) - Abnormal; Notable for the following components:   Troponin I (High Sensitivity) 19 (*)    All other components within normal limits  TROPONIN I (HIGH SENSITIVITY) - Abnormal; Notable for the following components:   Troponin I (High Sensitivity) 20 (*)    All other components within normal limits  RESP PANEL BY RT-PCR (RSV, FLU A&B, COVID)  RVPGX2  CULTURE, BLOOD (ROUTINE X 2)  CULTURE, BLOOD (ROUTINE X 2)  TSH  LACTIC ACID, PLASMA  URINALYSIS, W/ REFLEX TO CULTURE (INFECTION SUSPECTED)                                                                                                                          Radiology CT Head Wo  Contrast  Result Date: 07/16/2022 CLINICAL DATA:  Altered mental status EXAM: CT HEAD WITHOUT CONTRAST TECHNIQUE: Contiguous axial images were obtained from the base of the skull through the vertex without intravenous contrast. RADIATION DOSE REDUCTION: This exam was performed according to the departmental dose-optimization program which includes automated exposure control, adjustment of the mA and/or kV according to patient size and/or use of iterative reconstruction technique. COMPARISON:  None Available.  FINDINGS: Brain: No evidence of acute infarction, hemorrhage, hydrocephalus, extra-axial collection or mass lesion/mass effect. Mild atrophic changes and chronic white matter ischemic changes are seen. Vascular: No hyperdense vessel or unexpected calcification. Skull: Normal. Negative for fracture or focal lesion. Sinuses/Orbits: No acute finding. Other: None. IMPRESSION: Chronic atrophic and ischemic changes. Electronically Signed   By: Alcide Clever M.D.   On: 07/16/2022 21:34   DG Chest Port 1 View  Result Date: 07/16/2022 CLINICAL DATA:  Cough. Loss of consciousness earlier this evening. Altered mental status. EXAM: PORTABLE CHEST 1 VIEW COMPARISON:  03/17/2022 FINDINGS: Postoperative changes in the mediastinum. Shallow inspiration. Cardiac enlargement with mild vascular congestion. No edema or consolidation. Probable linear atelectasis in the lung bases. No pleural effusions. No pneumothorax. Mediastinal contours appear intact. IMPRESSION: Cardiac enlargement. Probable atelectasis in the lung bases. No evidence of active pulmonary disease. Electronically Signed   By: Burman Nieves M.D.   On: 07/16/2022 20:56    Pertinent labs & imaging results that were available during my care of the patient were reviewed by me and considered in my medical decision making (see MDM for details).  Medications Ordered in ED Medications  sodium chloride 0.9 % bolus 500 mL (0 mLs Intravenous Stopped 07/16/22 2111)   sodium chloride 0.9 % bolus 500 mL (0 mLs Intravenous Stopped 07/16/22 2214)  potassium chloride SA (KLOR-CON M) CR tablet 40 mEq (40 mEq Oral Given 07/16/22 2214)  furosemide (LASIX) injection 60 mg (60 mg Intravenous Given 07/16/22 2257)                                                                                                                                     Procedures Ultrasound ED Echo  Date/Time: 07/16/2022 11:17 PM  Performed by: Lonell Grandchild, MD Authorized by: Lonell Grandchild, MD   Procedure details:    Indications: dyspnea     Views: parasternal long axis view and IVC view     Images: archived     Limitations:  Body habitus Findings:    Pericardium: no pericardial effusion     LV Function: depressed (30 - 50%)     RV Diameter: normal     IVC: dilated   Impression:    Impression: probable elevated CVP     (including critical care time)  Medical Decision Making / ED Course   MDM:  72 year old male presenting to the emergency department with confusion, altered mental status.  Initial workup obtained including CT head, sepsis labs including lactic acid obtained.  Ultimately patient admitted to drinking large quantity of alcohol.  Suspect this is the primary cause of his somnolence.  Will obtain ethanol level.  Labs otherwise notable for mild hypokalemia.  Ammonia is minimally elevated but likely not cause of his symptoms.  CT head negative for acute intracranial process.  Lactic acid is elevated, unclear if this is related to alcohol use, patient did receive small  fluid bolus.  He does have increased oxygen requirement and pulmonary crackles, hard to tell if this is related to somnolence from alcohol use or volume overload from his chronic CHF, and his crackles are also possibly caused by his longstanding pulmonary fibrosis.  Doubt lactic acidosis is due to any process such as cardiogenic shock or sepsis.  Extremities are warm and blood pressure reassuring.     Patient did have a abnormal EKG, did appear to have some new ST changes, discussed with Dr. Herbie Baltimore on call STEMI physician , felt did not meet STEMI criteria, additionally patient does not had any chest pain.  Need continued observation given intoxication and lactic acidosis.   Clinical Course as of 07/16/22 2319  Fri Jul 16, 2022  2313 Signed out pending reassessment, urinalysis. Does appear volume overloaded on bedside ultrasound so will give lasix. If patient is sober and remains hypoxic may need admission for CHF exacerbation. Repeat lactic acid is pending [WS]    Clinical Course User Index [WS] Lonell Grandchild, MD     Additional history obtained: -Additional history obtained from family -External records from outside source obtained and reviewed including: Chart review including previous notes, labs, imaging, consultation notes including prior cardiology notes and echocardiogram   Lab Tests: -I ordered, reviewed, and interpreted labs.   The pertinent results include:   Labs Reviewed  COMPREHENSIVE METABOLIC PANEL - Abnormal; Notable for the following components:      Result Value   Potassium 2.9 (*)    Glucose, Bld 164 (*)    Calcium 8.1 (*)    Total Protein 6.2 (*)    Albumin 2.9 (*)    Total Bilirubin 2.2 (*)    All other components within normal limits  CBC WITH DIFFERENTIAL/PLATELET - Abnormal; Notable for the following components:   RBC 3.45 (*)    Hemoglobin 11.0 (*)    HCT 33.6 (*)    RDW 17.5 (*)    Platelets 58 (*)    All other components within normal limits  BLOOD GAS, VENOUS - Abnormal; Notable for the following components:   pCO2, Ven 38 (*)    pO2, Ven 65 (*)    All other components within normal limits  LACTIC ACID, PLASMA - Abnormal; Notable for the following components:   Lactic Acid, Venous 2.6 (*)    All other components within normal limits  AMMONIA - Abnormal; Notable for the following components:   Ammonia 61 (*)    All other components  within normal limits  PROTIME-INR - Abnormal; Notable for the following components:   Prothrombin Time 20.2 (*)    INR 1.7 (*)    All other components within normal limits  BRAIN NATRIURETIC PEPTIDE - Abnormal; Notable for the following components:   B Natriuretic Peptide 521.0 (*)    All other components within normal limits  ETHANOL - Abnormal; Notable for the following components:   Alcohol, Ethyl (B) 220 (*)    All other components within normal limits  CBG MONITORING, ED - Abnormal; Notable for the following components:   Glucose-Capillary 156 (*)    All other components within normal limits  TROPONIN I (HIGH SENSITIVITY) - Abnormal; Notable for the following components:   Troponin I (High Sensitivity) 19 (*)    All other components within normal limits  TROPONIN I (HIGH SENSITIVITY) - Abnormal; Notable for the following components:   Troponin I (High Sensitivity) 20 (*)    All other components within normal limits  RESP PANEL  BY RT-PCR (RSV, FLU A&B, COVID)  RVPGX2  CULTURE, BLOOD (ROUTINE X 2)  CULTURE, BLOOD (ROUTINE X 2)  TSH  LACTIC ACID, PLASMA  URINALYSIS, W/ REFLEX TO CULTURE (INFECTION SUSPECTED)    Notable for elevated BNP, elevated lactic acid, borderline troponin, mild hypokalemia  EKG   EKG Interpretation  Date/Time:  Friday July 16 2022 20:23:34 EDT Ventricular Rate:  70 PR Interval:  269 QRS Duration: 185 QT Interval:  487 QTC Calculation: 526 R Axis:   -63 Text Interpretation: Sinus rhythm Prolonged PR interval RBBB and LAFB Left ventricular hypertrophy ST depression in lead I and AVL  ST elevation in aVF    Confirmed by Alvino Blood ([id number]) on 07/16/2022 8:54:36 PM         Imaging Studies ordered: I ordered imaging studies including CXR On my interpretation imaging demonstrates chronic findings I independently visualized and interpreted imaging. I agree with the radiologist interpretation   Medicines ordered and prescription drug  management: Meds ordered this encounter  Medications   sodium chloride 0.9 % bolus 500 mL   sodium chloride 0.9 % bolus 500 mL   potassium chloride SA (KLOR-CON M) CR tablet 40 mEq   furosemide (LASIX) injection 60 mg    -I have reviewed the patients home medicines and have made adjustments as needed   Consultations Obtained: I requested consultation with the cardiologist,  and discussed lab and imaging findings as well as pertinent plan - they recommend: ECG not c/w STEMI   Cardiac Monitoring: The patient was maintained on a cardiac monitor.  I personally viewed and interpreted the cardiac monitored which showed an underlying rhythm of: NSR  Social Determinants of Health:  Diagnosis or treatment significantly limited by social determinants of health: alcohol use   Reevaluation: After the interventions noted above, I reevaluated the patient and found that their symptoms have improved  Co morbidities that complicate the patient evaluation  Past Medical History:  Diagnosis Date   Adjustment disorder with depressed mood 09/07/2007   Cataract    removed bilaterally   Chronic kidney disease    kidney stones   Cirrhosis (HCC)    Colon polyps    Tubular Adenoma 2010   CONGESTIVE HEART FAILURE 12/09/2008   CORONARY ARTERY DISEASE 2006   HYPERLIPIDEMIA 08/09/2006   HYPERTENSION 08/09/2006   MYOCARDIAL INFARCTION, HX OF 07/07/2004   NEPHROLITHIASIS, HX OF 08/09/2006   OBESITY 07/19/2008   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 Excell Seltzer and Dr. Leafy Ro   Sleep apnea    has a cpap - not using religiously      Dispostion: Disposition decision including need for hospitalization was considered, and patient disposition pending at time of sign out.    Final Clinical Impression(s) / ED  Diagnoses Final diagnoses:  Alcoholic intoxication without complication (HCC)  Lactic acidosis     This chart was dictated using voice recognition software.  Despite best efforts to proofread,  errors can occur which can change the documentation meaning.    Lonell Grandchild, MD 07/16/22 [phone]

## 2022-07-16 NOTE — ED Provider Notes (Signed)
Plan to allow him to sober, recheck O2 requirement (on [facility] chronically) and recheck lactic acid   Zadie Rhine, MD 07/16/22 2308

## 2022-07-17 LAB — CULTURE, BLOOD (ROUTINE X 2): Culture: NO GROWTH

## 2022-07-17 LAB — URINALYSIS, W/ REFLEX TO CULTURE (INFECTION SUSPECTED)
Bilirubin Urine: NEGATIVE
Glucose, UA: NEGATIVE mg/dL
Ketones, ur: NEGATIVE mg/dL
Leukocytes,Ua: NEGATIVE
Nitrite: NEGATIVE
Protein, ur: 30 mg/dL — AB
Specific Gravity, Urine: 1.011 (ref 1.005–1.030)
pH: 5 (ref 5.0–8.0)

## 2022-07-17 NOTE — ED Provider Notes (Signed)
Overall patient appears improved.  He is awake alert in no distress.  He is able to ambulate at his baseline.  I have scaled back to his oxygen to around 5 L and he is tolerating.  Patient admits to alcohol use tonight that likely triggered this episode.  No signs of UTI.  Lactate is minimally improved, but he has had this before likely due to his underlying liver disease  Wife feels comfortable taking patient home.  Patient will be discharged home   Zadie Rhine, MD 07/17/22 [phone]

## 2022-07-18 LAB — CULTURE, BLOOD (ROUTINE X 2): Special Requests: ADEQUATE

## 2022-07-20 ENCOUNTER — Telehealth: Payer: Self-pay | Admitting: Internal Medicine

## 2022-07-20 ENCOUNTER — Encounter (HOSPITAL_COMMUNITY)
Admission: RE | Admit: 2022-07-20 | Discharge: 2022-07-20 | Disposition: A | Discharge status: Home / Self Care | Payer: 59 | Source: Ambulatory Visit | Attending: Pulmonary Disease | Admitting: Pulmonary Disease

## 2022-07-20 DIAGNOSIS — J849 Interstitial pulmonary disease, unspecified: Secondary | ICD-10-CM | POA: Diagnosis not present

## 2022-07-20 LAB — CULTURE, BLOOD (ROUTINE X 2): Special Requests: ADEQUATE

## 2022-07-20 NOTE — Telephone Encounter (Signed)
Pt needs ER f/u.

## 2022-07-20 NOTE — Progress Notes (Signed)
Daily Session Note  Patient Details  Name: [name] MRN: [id number] Date of Birth: 03-18-1950 Referring Provider:   Flowsheet Row PULMONARY REHAB OTHER RESP ORIENTATION from 06/10/2022 in [facility] CARDIAC REHABILITATION  Referring Provider Dr. Isaiah Serge       Encounter Date: 07/20/2022  Check In:  Session Check In - 07/20/22 1500       Check-In   Supervising physician immediately available to respond to emergencies [facility] MD immediately available    Physician(s) Dr Diona Browner    Location [facility]    Staff Present Ross Ludwig, BS, Exercise Physiologist;Danny Gala Romney, RN, BSN    Virtual Visit No    Medication changes reported     No    Fall or balance concerns reported    Yes    Comments Walks with a walker and gait is unsteady.    Tobacco Cessation No Change    Warm-up and Cool-down Performed as group-led instruction    Resistance Training Performed Yes      Pain Assessment   Currently in Pain? No/denies    Pain Score 0-No pain             Capillary Blood Glucose: No results found for this or any previous visit (from the past 24 hour(s)).    Social History   Tobacco Use  Smoking Status Never   Passive exposure: Never  Smokeless Tobacco Never    Goals Met:  Proper associated with RPD/PD & O2 Sat Independence with exercise equipment Using PLB without cueing & demonstrates good technique Exercise tolerated well Queuing for purse lip breathing No report of concerns or symptoms today Strength training completed today  Goals Unmet:  Not Applicable  Comments: Checkout at 1600.   Dr. Erick Blinks is Medical Director for [facility].

## 2022-07-21 LAB — CULTURE, BLOOD (ROUTINE X 2): Culture: NO GROWTH

## 2022-07-21 NOTE — Progress Notes (Signed)
Pulmonary Individual Treatment Plan  Patient Details  Name: [name] MRN: [id number] Date of Birth: 11-15-1950 Referring Provider:   Flowsheet Row PULMONARY REHAB OTHER RESP ORIENTATION from 06/10/2022 in [facility] CARDIAC REHABILITATION  Referring Provider Dr. Isaiah Serge       Initial Encounter Date:  Flowsheet Row PULMONARY REHAB OTHER RESP ORIENTATION from 06/10/2022 in [facility] CARDIAC REH[facility]ILITATION  Date 06/10/22       Visit Diagnosis: ILD (interstitial lung disease) (HCC)  Patient's Home Medications on Admission:   Current Outpatient Medications:    acetaminophen (TYLENOL) 500 MG tablet, Take 1,000 mg by mouth daily as needed for moderate pain or headache., Disp: , Rfl:    amoxicillin (AMOXIL) 500 MG tablet, Take 4 tablets (2,000 mg total) by mouth as directed. 1 HOUR PRIOR TO DENTAL APPOINTMENTS, Disp: 12 tablet, Rfl: 6   aspirin EC 81 MG tablet, Take 1 tablet (81 mg total) by mouth daily. Swallow whole., Disp: 30 tablet, Rfl: 12   atorvastatin (LIPITOR) 80 MG tablet, TAKE 1 TABLET BY MOUTH EVERY DAY, Disp: 90 tablet, Rfl: 1   carvedilol (COREG) 3.125 MG tablet, TAKE 1 TABLET BY MOUTH TWICE A DAY WITH FOOD, Disp: 60 tablet, Rfl: 5   [employer] VITAMIN B12 1000 MCG tablet, TAKE 1 TABLET BY MOUTH EVERY DAY, Disp: 180 tablet, Rfl: 1   JARDIANCE 10 MG TABS tablet, TAKE 1 TABLET BY MOUTH DAILY BEFORE BREAKFAST., Disp: 30 tablet, Rfl: 11   KLOR-CON M20 20 MEQ tablet, TAKE 1 TABLET BY MOUTH EVERY DAY, Disp: 30 tablet, Rfl: 11   Misc Natural Products (LIVER PROTECT PO), Take 1 capsule by mouth 2 (two) times daily. From [employer], Disp: , Rfl:    OXYGEN, Inhale into the lungs., Disp: , Rfl:    Pirfenidone 267 MG TABS, Take 3 tablets (801 mg total) by mouth 3 (three) times daily with meals., Disp: 270 tablet, Rfl: 5   tamsulosin (FLOMAX) 0.4 MG CAPS capsule, TAKE ONE CAPSULE BY MOUTH EVERY DAY AFTER SUPPER, Disp: 90 capsule, Rfl: 1   torsemide 60 MG TABS, Take 60 mg by mouth daily.  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 Excell Seltzer and Dr. Leafy Ro   Sleep apnea    has a cpap - not using religiously    Tobacco Use: Social History   Tobacco Use  Smoking Status Never   Passive exposure: Never  Smokeless Tobacco Never    Labs: Review Flowsheet  More data exists      Latest Ref Rng & Units 02/12/2021 08/10/2021 02/17/2022 03/16/2022 07/16/2022  Labs for ITP Cardiac and Pulmonary Rehab  Cholestrol 0 - 200 mg/dL 409  - - - -  LDL (calc) 0 - 99 mg/dL 65  - - - -  HDL-C >40.98 mg/dL 11.91  - - - -  Trlycerides 0.0 - 149.0 mg/dL 47.8  - - - -  Hemoglobin A1c 4.0 - 5.6 % 5.9  5.8  - - -  PH, Arterial 7.35 - 7.45 - - 7.443  7.373  -  PCO2 arterial 32 - 48 mmHg - - 30.4  36.7  -  Bicarbonate 20.0 - 28.0 mmol/L - - 20.8  22.5  21.4  25.3   TCO2 22 - 32 mmol/L - - 22  23  19  22  20   -  Acid-base deficit 0.0 - 2.0 mmol/L - - 3.0  1.0  3.0  -  O2 Saturation % - - 94  79  89  94.2     Capillary Blood Glucose: Lab Results  Component Value Date   GLUCAP 156 (H) 07/16/2022   GLUCAP 140 (H) 06/24/2022   GLUCAP 138 (H) 06/22/2022   GLUCAP 128 (H) 06/10/2022     Pulmonary Assessment Scores:  Pulmonary  Assessment Scores     Row Name 06/10/22 0950         ADL UCSD   ADL Phase Entry     SOB Score total 61     Walk 2     Stairs 1     Bath 1     Dress 2     Shop 2       CAT Score   CAT Score 25       mMRC Score   mMRC Score 3             UCSD: Self-administered rating of dyspnea associated with activities of daily living (ADLs) 6-point scale (0 = "not at all" to 5 = "maximal or unable to do because of breathlessness")  Scoring Scores range from 0 to 120.  Minimally important difference is 5 units  CAT: CAT can identify the health impairment of COPD patients and is better correlated with disease progression.  CAT has a scoring range of zero to 40. The CAT score is classified into four groups of low (less than 10), medium (10 - 20), high (21-30) and very high (31-40) based on the impact level of disease on health status. A CAT score over 10 suggests significant symptoms.  A worsening CAT score could be explained by an exacerbation, poor medication adherence, poor inhaler technique, or progression of COPD or comorbid conditions.  CAT MCID is 2 points  mMRC: mMRC (Modified Medical Research Council) Dyspnea Scale is used to assess the degree of baseline functional disability in patients of respiratory disease due to dyspnea. No minimal important difference is established. A decrease in score of 1 point or greater is considered a positive change.   Pulmonary Function Assessment:   Exercise Target Goals: Exercise Program Goal: Individual exercise prescription set using results from initial 6 min walk test and THRR while considering  patient's activity barriers and safety.   Exercise Prescription Goal: Initial exercise prescription builds to 30-45 minutes a day of aerobic activity, 2-3 days per week.  Home exercise guidelines will be given to patient during program as part of exercise prescription that the participant will acknowledge.  Activity Barriers & Risk  Stratification:  Activity Barriers & Cardiac Risk Stratification - 06/10/22 0855       Activity Barriers & Cardiac Risk Stratification   Activity Barriers Back Problems;Deconditioning;Shortness of Breath;Assistive Device;Balance Concerns    Cardiac Risk Stratification  High             6 Minute Walk:  6 Minute Walk     Row Name 06/10/22 1023         6 Minute Walk   Phase Initial     Distance 450 feet     Walk Time 6 minutes     # of Rest Breaks 2     MPH 0.85     METS 0.86     RPE 13     Perceived Dyspnea  14     VO2 Peak 3.03     Symptoms Yes (comment)     Comments pt needed two breaks due to SOB and oxygen levels dropping (79-80)     Resting HR 63 bpm     Resting BP 132/42     Resting Oxygen Saturation  92 %     Exercise Oxygen Saturation  during 6 min walk 78 %     Max Ex. HR 80 bpm     Max Ex. BP 152/60     2 Minute Post BP 140/58       Interval HR   1 Minute HR 81     2 Minute HR 80     3 Minute HR 79     4 Minute HR 80     5 Minute HR 80     6 Minute HR 79     2 Minute Post HR 74     Interval Heart Rate? Yes       Interval Oxygen   Interval Oxygen? Yes     Baseline Oxygen Saturation % 92 %     1 Minute Oxygen Saturation % 88 %     1 Minute Liters of Oxygen 8 L     2 Minute Oxygen Saturation % 78 %     2 Minute Liters of Oxygen 8 L     3 Minute Oxygen Saturation % 81 %     3 Minute Liters of Oxygen 8 L     4 Minute Oxygen Saturation % 88 %     4 Minute Liters of Oxygen 8 L     5 Minute Oxygen Saturation % 81 %     5 Minute Liters of Oxygen 8 L     6 Minute Oxygen Saturation % 79 %     6 Minute Liters of Oxygen 8 L     2 Minute Post Oxygen Saturation % 86 %     2 Minute Post Liters of Oxygen 8 L              Oxygen Initial Assessment:  Oxygen Initial Assessment - 06/10/22 1031       Home Oxygen   Home Oxygen Device Home Concentrator;E-Tanks    Sleep Oxygen Prescription Continuous    Liters per minute 4    Home Resting Oxygen  Prescription Continuous    Liters per minute 4    Compliance with Home Oxygen Use Yes      Initial 6 min Walk   Oxygen Used Continuous    Liters per minute 8      Program Oxygen Prescription   Program Oxygen Prescription Continuous    Liters per minute 6    Comments pt will start at 6 Liters and if need go to 8 liters      Intervention   Short Term Goals To learn and exhibit compliance with exercise, home and travel O2 prescription;To learn and  understand importance of monitoring SPO2 with pulse oximeter and demonstrate accurate use of the pulse oximeter.;To learn and understand importance of maintaining oxygen saturations>88%;To learn and demonstrate proper pursed lip breathing techniques or other breathing techniques.     Long  Term Goals Exhibits compliance with exercise, home  and travel O2 prescription;Maintenance of O2 saturations>88%;Verbalizes importance of monitoring SPO2 with pulse oximeter and return demonstration;Exhibits proper breathing techniques, such as pursed lip breathing or other method taught during program session             Oxygen Re-Evaluation:  Oxygen Re-Evaluation     Row Name 06/23/22 1435 07/20/22 1316           Program Oxygen Prescription   Program Oxygen Prescription Continuous Continuous      Liters per minute 6 6      Comments Pt did well on 6 liters --        Home Oxygen   Home Oxygen Device Home Concentrator;E-Tanks Home Concentrator;E-Tanks      Sleep Oxygen Prescription Continuous Continuous      Liters per minute 4 4      Home Resting Oxygen Prescription Continuous Continuous      Liters per minute 4 4      Compliance with Home Oxygen Use Yes Yes        Goals/Expected Outcomes   Short Term Goals To learn and exhibit compliance with exercise, home and travel O2 prescription;To learn and understand importance of monitoring SPO2 with pulse oximeter and demonstrate accurate use of the pulse oximeter.;To learn and understand importance of  maintaining oxygen saturations>88%;To learn and demonstrate proper pursed lip breathing techniques or other breathing techniques.  To learn and exhibit compliance with exercise, home and travel O2 prescription;To learn and understand importance of monitoring SPO2 with pulse oximeter and demonstrate accurate use of the pulse oximeter.;To learn and understand importance of maintaining oxygen saturations>88%;To learn and demonstrate proper pursed lip breathing techniques or other breathing techniques.       Long  Term Goals Exhibits compliance with exercise, home  and travel O2 prescription;Maintenance of O2 saturations>88%;Verbalizes importance of monitoring SPO2 with pulse oximeter and return demonstration;Exhibits proper breathing techniques, such as pursed lip breathing or other method taught during program session Exhibits compliance with exercise, home  and travel O2 prescription;Maintenance of O2 saturations>88%;Verbalizes importance of monitoring SPO2 with pulse oximeter and return demonstration;Exhibits proper breathing techniques, such as pursed lip breathing or other method taught during program session      Goals/Expected Outcomes compliance compliance               Oxygen Discharge (Final Oxygen Re-Evaluation):  Oxygen Re-Evaluation - 07/20/22 1316       Program Oxygen Prescription   Program Oxygen Prescription Continuous    Liters per minute 6      Home Oxygen   Home Oxygen Device Home Concentrator;E-Tanks    Sleep Oxygen Prescription Continuous    Liters per minute 4    Home Resting Oxygen Prescription Continuous    Liters per minute 4    Compliance with Home Oxygen Use Yes      Goals/Expected Outcomes   Short Term Goals To learn and exhibit compliance with exercise, home and travel O2 prescription;To learn and understand importance of monitoring SPO2 with pulse oximeter and demonstrate accurate use of the pulse oximeter.;To learn and understand importance of maintaining oxygen  saturations>88%;To learn and demonstrate proper pursed lip breathing techniques or other breathing techniques.  Long  Term Goals Exhibits compliance with exercise, home  and travel O2 prescription;Maintenance of O2 saturations>88%;Verbalizes importance of monitoring SPO2 with pulse oximeter and return demonstration;Exhibits proper breathing techniques, such as pursed lip breathing or other method taught during program session    Goals/Expected Outcomes compliance             Initial Exercise Prescription:  Initial Exercise Prescription - 06/10/22 1000       Date of Initial Exercise RX and Referring Provider   Date 06/10/22    Referring Provider Dr. Isaiah Serge    Expected Discharge Date 09/09/22      Oxygen   Oxygen Continuous    Liters 6    Maintain Oxygen Saturation 88% or higher      NuStep   Level 1    SPM 60    Minutes 39      Prescription Details   Frequency (times per week) 2    Duration Progress to 30 minutes of continuous aerobic without signs/symptoms of physical distress      Intensity   THRR 40-80% of Max Heartrate 58-118    Ratings of Perceived Exertion 11-13    Perceived Dyspnea 0-4      Resistance Training   Training Prescription Yes    Weight 2    Reps 10-15             Perform Capillary Blood Glucose checks as needed.  Exercise Prescription Changes:   Exercise Prescription Changes     Row Name 06/22/22 1500 07/06/22 1600 07/20/22 1500         Response to Exercise   Blood Pressure (Admit) 130/52 124/52 120/60     Blood Pressure (Exercise) 140/60 124/66 140/58     Blood Pressure (Exit) 136/60 110/50 140/60     Heart Rate (Admit) 70 bpm 64 bpm 65 bpm     Heart Rate (Exercise) 75 bpm 69 bpm 68 bpm     Heart Rate (Exit) 77 bpm 66 bpm 63 bpm     Oxygen Saturation (Admit) 89 % 91 % 91 %     Oxygen Saturation (Exercise) 83 % 89 % 89 %     Oxygen Saturation (Exit) 89 % 90 % 93 %     Rating of Perceived Exertion (Exercise) 12 12 12       Perceived Dyspnea (Exercise) 13 12 12      Duration Continue with 30 min of aerobic exercise without signs/symptoms of physical distress. Continue with 30 min of aerobic exercise without signs/symptoms of physical distress. Continue with 30 min of aerobic exercise without signs/symptoms of physical distress.     Intensity THRR unchanged THRR unchanged THRR unchanged       Progression   Progression Continue to progress workloads to maintain intensity without signs/symptoms of physical distress. Continue to progress workloads to maintain intensity without signs/symptoms of physical distress. Continue to progress workloads to maintain intensity without signs/symptoms of physical distress.       Resistance Training   Training Prescription Yes Yes Yes     Weight 3 3 3      Reps 10-15 10-15 10-15     Time 10 Minutes 10 Minutes 10 Minutes       Oxygen   Oxygen Continuous Continuous Continuous     Liters 6 6 6        NuStep   Level 1 1 1      SPM 83 86 85     Minutes 39 39 39  METs 1.9 1.9 1.9              Exercise Comments:   Exercise Goals and Review:   Exercise Goals     Row Name 06/10/22 1030 06/23/22 1432 07/20/22 1306         Exercise Goals   Increase Physical Activity Yes Yes Yes     Intervention Provide advice, education, support and counseling about physical activity/exercise needs.;Develop an individualized exercise prescription for aerobic and resistive training based on initial evaluation findings, risk stratification, comorbidities and participant's personal goals. Provide advice, education, support and counseling about physical activity/exercise needs.;Develop an individualized exercise prescription for aerobic and resistive training based on initial evaluation findings, risk stratification, comorbidities and participant's personal goals. Provide advice, education, support and counseling about physical activity/exercise needs.;Develop an individualized exercise  prescription for aerobic and resistive training based on initial evaluation findings, risk stratification, comorbidities and participant's personal goals.     Expected Outcomes Long Term: Add in home exercise to make exercise part of routine and to increase amount of physical activity.;Short Term: Attend rehab on a regular basis to increase amount of physical activity.;Long Term: Exercising regularly at least 3-5 days a week. Long Term: Add in home exercise to make exercise part of routine and to increase amount of physical activity.;Short Term: Attend rehab on a regular basis to increase amount of physical activity.;Long Term: Exercising regularly at least 3-5 days a week. Long Term: Add in home exercise to make exercise part of routine and to increase amount of physical activity.;Short Term: Attend rehab on a regular basis to increase amount of physical activity.;Long Term: Exercising regularly at least 3-5 days a week.     Increase Strength and Stamina Yes Yes Yes     Intervention Provide advice, education, support and counseling about physical activity/exercise needs.;Develop an individualized exercise prescription for aerobic and resistive training based on initial evaluation findings, risk stratification, comorbidities and participant's personal goals. Provide advice, education, support and counseling about physical activity/exercise needs.;Develop an individualized exercise prescription for aerobic and resistive training based on initial evaluation findings, risk stratification, comorbidities and participant's personal goals. Provide advice, education, support and counseling about physical activity/exercise needs.;Develop an individualized exercise prescription for aerobic and resistive training based on initial evaluation findings, risk stratification, comorbidities and participant's personal goals.     Expected Outcomes Short Term: Increase workloads from initial exercise prescription for resistance,  speed, and METs.;Short Term: Perform resistance training exercises routinely during rehab and add in resistance training at home;Long Term: Improve cardiorespiratory fitness, muscular endurance and strength as measured by increased METs and functional capacity ( ) Short Term: Increase workloads from initial exercise prescription for resistance, speed, and METs.;Short Term: Perform resistance training exercises routinely during rehab and add in resistance training at home;Long Term: Improve cardiorespiratory fitness, muscular endurance and strength as measured by increased METs and functional capacity ( ) Short Term: Increase workloads from initial exercise prescription for resistance, speed, and METs.;Short Term: Perform resistance training exercises routinely during rehab and add in resistance training at home;Long Term: Improve cardiorespiratory fitness, muscular endurance and strength as measured by increased METs and functional capacity ( )     Able to understand and use rate of perceived exertion (RPE) scale Yes Yes Yes     Intervention Provide education and explanation on how to use RPE scale Provide education and explanation on how to use RPE scale Provide education and explanation on how to use RPE scale     Expected Outcomes Short Term: Able  to use RPE daily in rehab to express subjective intensity level;Long Term:  Able to use RPE to guide intensity level when exercising independently Short Term: Able to use RPE daily in rehab to express subjective intensity level;Long Term:  Able to use RPE to guide intensity level when exercising independently Short Term: Able to use RPE daily in rehab to express subjective intensity level;Long Term:  Able to use RPE to guide intensity level when exercising independently     Able to understand and use Dyspnea scale Yes Yes Yes     Intervention Provide education and explanation on how to use Dyspnea scale Provide education and explanation on how to use  Dyspnea scale Provide education and explanation on how to use Dyspnea scale     Expected Outcomes Short Term: Able to use Dyspnea scale daily in rehab to express subjective sense of shortness of breath during exertion;Long Term: Able to use Dyspnea scale to guide intensity level when exercising independently Short Term: Able to use Dyspnea scale daily in rehab to express subjective sense of shortness of breath during exertion;Long Term: Able to use Dyspnea scale to guide intensity level when exercising independently Short Term: Able to use Dyspnea scale daily in rehab to express subjective sense of shortness of breath during exertion;Long Term: Able to use Dyspnea scale to guide intensity level when exercising independently     Knowledge and understanding of Target Heart Rate Range (THRR) Yes Yes Yes     Intervention Provide education and explanation of THRR including how the numbers were predicted and where they are located for reference Provide education and explanation of THRR including how the numbers were predicted and where they are located for reference Provide education and explanation of THRR including how the numbers were predicted and where they are located for reference     Expected Outcomes Short Term: Able to state/look up THRR;Long Term: Able to use THRR to govern intensity when exercising independently;Short Term: Able to use daily as guideline for intensity in rehab Short Term: Able to state/look up THRR;Long Term: Able to use THRR to govern intensity when exercising independently;Short Term: Able to use daily as guideline for intensity in rehab Short Term: Able to state/look up THRR;Long Term: Able to use THRR to govern intensity when exercising independently;Short Term: Able to use daily as guideline for intensity in rehab     Understanding of Exercise Prescription Yes Yes Yes     Intervention Provide education, explanation, and written materials on patient's individual exercise prescription  Provide education, explanation, and written materials on patient's individual exercise prescription Provide education, explanation, and written materials on patient's individual exercise prescription     Expected Outcomes Short Term: Able to explain program exercise prescription;Long Term: Able to explain home exercise prescription to exercise independently Short Term: Able to explain program exercise prescription;Long Term: Able to explain home exercise prescription to exercise independently Short Term: Able to explain program exercise prescription;Long Term: Able to explain home exercise prescription to exercise independently              Exercise Goals Re-Evaluation :  Exercise Goals Re-Evaluation     Row Name 06/23/22 1432 07/20/22 1310           Exercise Goal Re-Evaluation   Exercise Goals Review Increase Physical Activity;Increase Strength and Stamina;Able to understand and use rate of perceived exertion (RPE) scale;Able to understand and use Dyspnea scale;Knowledge and understanding of Target Heart Rate Range (THRR);Understanding of Exercise Prescription Increase Physical Activity;Increase Strength  and Stamina;Able to understand and use rate of perceived exertion (RPE) scale;Able to understand and use Dyspnea scale;Knowledge and understanding of Target Heart Rate Range (THRR);Understanding of Exercise Prescription      Comments Pt has completed their first session of PR. He is eager to be in the program and ready to go. He is slightly deconditioned and will progress slowly. His SpO2 dropped (80%) during exercise at 4L so he was increase to 6L. He is currently exercising at 1.9 METs on the stepper. Will continue to monitor and progress as able. Pt has completed 5 sessions of pulmonary rehab. At first the patient seemd to be egar to be in the program but he was been a no show last week. during his last visit his SpO2 dropped (81%) and it has been taking longer for it to come back up to 90%.  He is currently on 6 liters of oxygen while exercising. We messaged Dr. Isaiah Serge and was given orders to have the patient wear O2 mask and be up to 10 liters if needed. When the patient returns to rehab we will have him wear the mask and increase his O2 levels to see if this improves his exercise SpO2. He was currently exercising at 1.9 METs on the stepper. Will continue to monitor and progress as able.      Expected Outcomes Through exercise at rehab and homw, the patient will meet their stated goals, Through exercise at rehab and home, the patient will achieve their goals.               Discharge Exercise Prescription (Final Exercise Prescription Changes):  Exercise Prescription Changes - 07/20/22 1500       Response to Exercise   Blood Pressure (Admit) 120/60    Blood Pressure (Exercise) 140/58    Blood Pressure (Exit) 140/60    Heart Rate (Admit) 65 bpm    Heart Rate (Exercise) 68 bpm    Heart Rate (Exit) 63 bpm    Oxygen Saturation (Admit) 91 %    Oxygen Saturation (Exercise) 89 %    Oxygen Saturation (Exit) 93 %    Rating of Perceived Exertion (Exercise) 12    Perceived Dyspnea (Exercise) 12    Duration Continue with 30 min of aerobic exercise without signs/symptoms of physical distress.    Intensity THRR unchanged      Progression   Progression Continue to progress workloads to maintain intensity without signs/symptoms of physical distress.      Resistance Training   Training Prescription Yes    Weight 3    Reps 10-15    Time 10 Minutes      Oxygen   Oxygen Continuous    Liters 6      NuStep   Level 1    SPM 85    Minutes 39    METs 1.9             Nutrition:  Target Goals: Understanding of nutrition guidelines, daily intake of sodium 1500mg , cholesterol 200mg , calories 30% from fat and 7% or less from saturated fats, daily to have 5 or more servings of fruits and vegetables.  Biometrics:  Pre Biometrics - 06/10/22 1031       Pre Biometrics   Height  5\' 6"  (1.676 m)    Weight 95.1 kg    Waist Circumference 41 inches    Hip Circumference 45 inches    Waist to Hip Ratio 0.91 %    BMI (Calculated) 33.86  Triceps Skinfold 10 mm    % Body Fat 29.1 %    Grip Strength 27.3 kg    Flexibility 0 in    Single Leg Stand 0 seconds              Nutrition Therapy Plan and Nutrition Goals:  Nutrition Therapy & Goals - 06/10/22 0952       Personal Nutrition Goals   Comments Patient scored 63 on his diet assessment. Handout explained and provided regarding healthier choices. We provide educational information on heart healthy nutrition with handouts and assistance with RD referral if patient is interested.      Intervention Plan   Intervention Nutrition handout(s) given to patient.    Expected Outcomes Short Term Goal: Understand basic principles of dietary content, such as calories, fat, sodium, cholesterol and nutrients.             Nutrition Assessments:  Nutrition Assessments - 06/10/22 0952       MEDFICTS Scores   Pre Score 63            MEDIFICTS Score Key: ?70 Need to make dietary changes  40-70 Heart Healthy Diet ? 40 Therapeutic Level Cholesterol Diet   Picture Your Plate Scores: <30 Unhealthy dietary pattern with much room for improvement. 41-50 Dietary pattern unlikely to meet recommendations for good health and room for improvement. 51-60 More healthful dietary pattern, with some room for improvement.  >60 Healthy dietary pattern, although there may be some specific behaviors that could be improved.    Nutrition Goals Re-Evaluation:   Nutrition Goals Discharge (Final Nutrition Goals Re-Evaluation):   Psychosocial: Target Goals: Acknowledge presence or absence of significant depression and/or stress, maximize coping skills, provide positive support system. Participant is able to verbalize types and ability to use techniques and skills needed for reducing stress and depression.  Initial Review &  Psychosocial Screening:  Initial Psych Review & Screening - 06/10/22 0957       Initial Review   Current issues with None Identified      Family Dynamics   Good Support System? Yes      Barriers   Psychosocial barriers to participate in program There are no identifiable barriers or psychosocial needs.      Screening Interventions   Interventions Encouraged to exercise;To provide support and resources with identified psychosocial needs;Provide feedback about the scores to participant    Expected Outcomes Short Term goal: Utilizing psychosocial counselor, staff and physician to assist with identification of specific Stressors or current issues interfering with healing process. Setting desired goal for each stressor or current issue identified.             Quality of Life Scores:  Quality of Life - 06/10/22 1155       Quality of Life   Select Quality of Life      Quality of Life Scores   Health/Function Pre 14.06 %    Socioeconomic Pre 22.19 %    Psych/Spiritual Pre 18.93 %    Family Pre 25 %    GLOBAL Pre 18.33 %            Scores of 19 and below usually indicate a poorer quality of life in these areas.  A difference of  2-3 points is a clinically meaningful difference.  A difference of 2-3 points in the total score of the Quality of Life Index has been associated with significant improvement in overall quality of life, self-image, physical symptoms, and general health in  studies assessing change in quality of life.   PHQ-9: Review Flowsheet  More data exists      06/10/2022 02/12/2021 11/30/2019 11/29/2018 04/11/2018  Depression screen PHQ 2/9  Decreased Interest 0 0 0 0 0  Down, Depressed, Hopeless 0 0 0 0 0  PHQ - 2 Score 0 0 0 0 0  Altered sleeping 1 - 0 0 -  Tired, decreased energy 1 - 0 0 -  Change in appetite 1 - 0 0 -  Feeling bad or failure about yourself  0 - 0 0 -  Trouble concentrating 0 - 0 0 -  Moving slowly or fidgety/restless 0 - 0 0 -  Suicidal  thoughts 0 - 0 0 -  PHQ-9 Score 3 - 0 0 -  Difficult doing work/chores Not difficult at all - Not difficult at all Not difficult at all -   Interpretation of Total Score  Total Score Depression Severity:  1-4 = Minimal depression, 5-9 = Mild depression, 10-14 = Moderate depression, 15-19 = Moderately severe depression, 20-27 = Severe depression   Psychosocial Evaluation and Intervention:  Psychosocial Evaluation - 06/10/22 0958       Psychosocial Evaluation & Interventions   Interventions Stress management education;Relaxation education;Encouraged to exercise with the program and follow exercise prescription    Comments Patient has no psychosocial barriers or issues identified at his orientation visit. He is accompained by his wife today both very pleasant. 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 He says most of his work is remote. He looks at and approves building plans. He says he plans to retire in the next year but does enjoy his work. He says he has a good support system with his wife of many years and he has a lot of friends that support him. He loves to play golf when he can. 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 He says he is hopeful the program will help him get stronger; improve his walking and breathing and he can get back to his normal activities. He is ready to start the program.    Expected Outcomes Patient will continue to have no psychosocial barriers identified.    Continue Psychosocial Services  No Follow up required             Psychosocial Re-Evaluation:  Psychosocial Re-Evaluation     Row Name 06/14/22 0836 07/13/22 1124           Psychosocial Re-Evaluation   Current issues with None Identified None Identified      Comments Patient plans to start soon.  Patient referred to  [facility] with Interstitial Lung Disease by Dr. Isaiah Serge.    Attended orientation on  06-10-22.  His personal goals for the program are to breath better and get back to his normal activites. Patient is new to the program attending his 5th session.   Patient referred to PR with Interstitial Lung Disease by Dr. Isaiah Serge. His personal goals for the program are to breath better and get back to his normal activites.   His initial QOL score is 18.33% and his PHQ-9 is 3.  Patient seems to enjoy coming to the program and is interest in improving his health.  We will continue to monitor as he progresses in the program.      Expected Outcomes Patient  will have psychosocial barriers identified at discharge. Patient will have no psychosocial barriers identified at discharge.      Interventions Encouraged to attend Pulmonary Rehabilitation for the exercise;Relaxation education;Stress management education Encouraged to attend Pulmonary Rehabilitation for the exercise;Relaxation education;Stress management education      Continue Psychosocial Services  No Follow up required No Follow up required               Psychosocial Discharge (Final Psychosocial Re-Evaluation):  Psychosocial Re-Evaluation - 07/13/22 1124       Psychosocial Re-Evaluation   Current issues with None Identified    Comments Patient is new to the program attending his 5th session.   Patient referred to PR with Interstitial Lung Disease by Dr. Isaiah Serge. His personal goals for the program are to breath better and get back to his normal activites.   His initial QOL score is 18.33% and his PHQ-9 is 3.  Patient seems to enjoy coming to the program and is interest in improving his health.  We will continue to monitor as he progresses in the program.    Expected Outcomes Patient will have no psychosocial barriers identified at discharge.    Interventions Encouraged to attend Pulmonary Rehabilitation for the exercise;Relaxation education;Stress management education     Continue Psychosocial Services  No Follow up required              Education: Education Goals: Education classes will be provided on a weekly basis, covering required topics. Participant will state understanding/return demonstration of topics presented.  Learning Barriers/Preferences:  Learning Barriers/Preferences - 06/10/22 0953       Learning Barriers/Preferences   Learning Barriers None    Learning Preferences Audio             Education Topics: How Lungs Work and Diseases: - Discuss the anatomy of the lungs and diseases that can affect the lungs, such as COPD.   Exercise: -Discuss the importance of exercise, FITT principles of exercise, normal and abnormal responses to exercise, and how to exercise safely.   Environmental Irritants: -Discuss types of environmental irritants and how to limit exposure to environmental irritants.   Meds/Inhalers and oxygen: - Discuss respiratory medications, definition of an inhaler and oxygen, and the proper way to use an inhaler and oxygen.   Energy Saving Techniques: - Discuss methods to conserve energy and decrease shortness of breath when performing activities of daily living.    Bronchial Hygiene / Breathing Techniques: - Discuss breathing mechanics, pursed-lip breathing technique,  proper posture, effective ways to clear airways, and other functional breathing techniques   Cleaning Equipment: - Provides group verbal and written instruction about the health risks of elevated stress, cause of high stress, and healthy ways to reduce stress.   Nutrition I: Fats: - Discuss the types of cholesterol, what cholesterol does to the body, and how cholesterol levels can be controlled.   Nutrition II: Labels: -Discuss the different components of food labels and how to read food labels. Flowsheet Row PULMONARY REHAB OTHER RESPIRATORY from 06/24/2022 in [facility]  Date 06/24/22  Educator Handout   Instruction Review Code 1- Verbalizes Understanding       Respiratory Infections: - Discuss the signs and symptoms of respiratory infections, ways to prevent respiratory infections, and the importance of seeking medical treatment when having a respiratory infection. Flowsheet Row PULMONARY REHAB OTHER RESPIRATORY from 07/08/2022 in [facility]  Date 07/08/22  Educator handout       Stress I:  Signs and Symptoms: - Discuss the causes of stress, how stress may lead to anxiety and depression, and ways to limit stress.   Stress II: Relaxation: -Discuss relaxation techniques to limit stress.   Oxygen for Home/Travel: - Discuss how to prepare for travel when on oxygen and proper ways to transport and store oxygen to ensure safety.   Knowledge Questionnaire Score:  Knowledge Questionnaire Score - 06/10/22 0953       Knowledge Questionnaire Score   Pre Score 15/18             Core Components/Risk Factors/Patient Goals at Admission:  Personal Goals and Risk Factors at Admission - 06/10/22 0954       Core Components/Risk Factors/Patient Goals on Admission    Weight Management Obesity    Improve shortness of breath with ADL's Yes    Intervention Provide education, individualized exercise plan and daily activity instruction to help decrease symptoms of SOB with activities of daily living.    Expected Outcomes Short Term: Improve cardiorespiratory fitness to achieve a reduction of symptoms when performing ADLs;Long Term: Be able to perform more ADLs without symptoms or delay the onset of symptoms    Increase knowledge of respiratory medications and ability to use respiratory devices properly  Yes    Intervention Provide education and demonstration as needed of appropriate use of medications, inhalers, and oxygen therapy.    Expected Outcomes Short Term: Achieves understanding of medications use. Understands that oxygen is a medication prescribed by physician.  Demonstrates appropriate use of inhaler and oxygen therapy.;Long Term: Maintain appropriate use of medications, inhalers, and oxygen therapy.    Heart Failure Yes    Intervention Provide a combined exercise and nutrition program that is supplemented with education, support and counseling about heart failure. Directed toward relieving symptoms such as shortness of breath, decreased exercise tolerance, and extremity edema.    Expected Outcomes Short term: Attendance in program 2-3 days a week with increased exercise capacity. Reported lower sodium intake. Reported increased fruit and vegetable intake. Reports medication compliance.    Hypertension Yes    Intervention Provide education on lifestyle modifcations including regular physical activity/exercise, weight management, moderate sodium restriction and increased consumption of fresh fruit, vegetables, and low fat dairy, alcohol moderation, and smoking cessation.;Monitor prescription use compliance.    Expected Outcomes Short Term: Continued assessment and intervention until BP is < 140/65mm HG in hypertensive participants. < 130/33mm HG in hypertensive participants with diabetes, heart failure or chronic kidney disease.;Long Term: Maintenance of blood pressure at goal levels.    Personal Goal Other Yes    Personal Goal Patient wants to breathe better and get back to his normal activities.    Intervention Patient will attend PR 2 days/week with exercise and education.    Expected Outcomes Patient will complete the program meeting both personal and program goals.             Core Components/Risk Factors/Patient Goals Review:   Goals and Risk Factor Review     Row Name 06/14/22 0844 07/13/22 1132           Core Components/Risk Factors/Patient Goals Review   Personal Goals Review Improve shortness of breath with ADL's;Develop more efficient breathing techniques such as purse lipped breathing and diaphragmatic breathing and practicing  self-pacing with activity.;Other Improve shortness of breath with ADL's;Develop more efficient breathing techniques such as purse lipped breathing and diaphragmatic breathing and practicing self-pacing with activity.;Other      Review Patient plans to start soon.  Patient referred to PR with Interstitial Lung Disease by Dr. Isaiah Serge. His personal  goals for the program are to breath better and get back to his normal activities.  We will encourage his progress as he meets these goals. Patient is new to PR program, attending his 5th session.   Patient referred to PR with Interstitial Lung Disease by Dr. Isaiah Serge.  His personal  goals for the program are to breath better and get back to his normal activities.  He exercises with oxygen at 6L via nasal cannula.  Tolerates exercise on the nu-step at level 1.   We continue to encourage  pursed lipped breathing and practice self pacing with increased activity.   Pre-exercise O2 sats  are 90% and exercise O2 sats are between 81%-89% .  Plans to change oxygen to mask while exercising.  We will continue to monitor his progress as he works toward meeting these goals.      Expected Outcomes Patient will complete the program meeting both program and peresonal goals. Patient will complete the program meeting both program and peresonal goals.               Core Components/Risk Factors/Patient Goals at Discharge (Final Review):   Goals and Risk Factor Review - 07/13/22 1132       Core Components/Risk Factors/Patient Goals Review   Personal Goals Review Improve shortness of breath with ADL's;Develop more efficient breathing techniques such as purse lipped breathing and diaphragmatic breathing and practicing self-pacing with activity.;Other    Review Patient is new to PR program, attending his 5th session.   Patient referred to PR with Interstitial Lung Disease by Dr. Isaiah Serge.  His personal  goals for the program are to breath better and get back to his normal activities.   He exercises with oxygen at 6L via nasal cannula.  Tolerates exercise on the nu-step at level 1.   We continue to encourage  pursed lipped breathing and practice self pacing with increased activity.   Pre-exercise O2 sats  are 90% and exercise O2 sats are between 81%-89% .  Plans to change oxygen to mask while exercising.  We will continue to monitor his progress as he works toward meeting these goals.    Expected Outcomes Patient will complete the program meeting both program and peresonal goals.             ITP Comments:  ITP Comments     Row Name 07/08/22 1557           ITP Comments Notified Dr. Isaiah Serge of the pt's desaturation during exercise.  Today was the pt's 5th visit, and during exercise his 02 sats were 83% on 6L 02 via [place].  The pt initially came to the program on 4L 02 via [place] and had to be bumped up to 6L on his first day.  During the halfway point of exercise, his 02 sats have been ranging from 81-86% and he has to stop exercising and deep breathe for his 02 sats to get up to 88%.  Today during exercise he had to stop for several minutes to get his 02 sats up to 89%.  I increased his oxygen to 8L via [place] and during the next check his 02 sats were 86%.  Per Dr. Isaiah Serge, we can place the pt on a mask during exercise and increase his oxygen up to 10 L.  The pt verbalized understanding of changing to a mask next week and that we might have to increase  his oxygen to 10 L.                Comments: ITP REVIEW Pt is making expected progress toward pulmonary rehab goals after completing 7 sessions. Recommend continued exercise, life style modification, education, and utilization of breathing techniques to increase stamina and strength and decrease shortness of breath with exertion.

## 2022-07-22 ENCOUNTER — Encounter (HOSPITAL_COMMUNITY): Payer: 59

## 2022-07-27 ENCOUNTER — Encounter (HOSPITAL_COMMUNITY)
Admission: RE | Admit: 2022-07-27 | Discharge: 2022-07-27 | Disposition: A | Discharge status: Home / Self Care | Payer: 59 | Source: Ambulatory Visit | Attending: Pulmonary Disease | Admitting: Pulmonary Disease

## 2022-07-27 NOTE — Evaluation (Signed)
Pt accompanied to PR with wife.  Pt is very weak and needing assistance to walk.  Wife reports pt ran out of wearable portable oxygen on the way to PR.  Wife reports pt being weak for over a week.  Pt needing wheelchair assistant back to car.  Pt says, "I am not strong enough to exercise but I am glad to ride out for awhile."

## 2022-07-29 ENCOUNTER — Encounter (HOSPITAL_COMMUNITY)
Admission: RE | Admit: 2022-07-29 | Discharge: 2022-07-29 | Disposition: A | Discharge status: Home / Self Care | Payer: 59 | Source: Ambulatory Visit | Attending: Pulmonary Disease | Admitting: Pulmonary Disease

## 2022-07-29 ENCOUNTER — Encounter (HOSPITAL_COMMUNITY): Payer: 59

## 2022-07-29 DIAGNOSIS — J849 Interstitial pulmonary disease, unspecified: Secondary | ICD-10-CM | POA: Diagnosis not present

## 2022-07-29 NOTE — Progress Notes (Signed)
Daily Session Note  Patient Details  Name: [name] MRN: [id number] Date of Birth: 1951-01-18 Referring Provider:   Flowsheet Row PULMONARY REHAB OTHER RESP ORIENTATION from 06/10/2022 in [facility]  Referring Provider Dr. Isaiah Serge       Encounter Date: 07/29/2022  Check In:  Session Check In - 07/29/22 1452       Check-In   Supervising physician immediately available to respond to emergencies [facility] MD immediately available    Physician(s) Dr Cathlean Sauer    Location [facility]    Staff Present Ross Ludwig, BS, Exercise Physiologist;Jann Milkovich Daphine Deutscher, RN, BSN    Virtual Visit No    Medication changes reported     No    Fall or balance concerns reported    Yes    Comments Walks with a walker and gait is unsteady.    Tobacco Cessation No Change    Warm-up and Cool-down Performed as group-led instruction    Resistance Training Performed Yes      Pain Assessment   Currently in Pain? No/denies             Capillary Blood Glucose: No results found for this or any previous visit (from the past 24 hour(s)).    Social History   Tobacco Use  Smoking Status Never   Passive exposure: Never  Smokeless Tobacco Never    Goals Met:  Proper associated with RPD/PD & O2 Sat Independence with exercise equipment Using PLB without cueing & demonstrates good technique Exercise tolerated well Queuing for purse lip breathing No report of concerns or symptoms today Strength training completed today  Goals Unmet:  Not Applicable  Comments: Checkout at 1600.   Dr. Erick Blinks is Medical Director for [facility].

## 2022-08-03 ENCOUNTER — Encounter (HOSPITAL_COMMUNITY): Admission: RE | Admit: 2022-08-03 | Payer: 59 | Source: Ambulatory Visit

## 2022-08-04 ENCOUNTER — Telehealth: Payer: Self-pay

## 2022-08-04 DIAGNOSIS — I8500 Esophageal varices without bleeding: Secondary | ICD-10-CM

## 2022-08-04 DIAGNOSIS — K7460 Unspecified cirrhosis of liver: Secondary | ICD-10-CM

## 2022-08-04 NOTE — Telephone Encounter (Signed)
-----   Message from Cooper Render, CMA sent at 05/20/2022  9:49 AM EDT ----- Regarding: due for RUQ U/S Due for RUQ U/S for cirrhosis in mid July

## 2022-08-05 ENCOUNTER — Encounter (HOSPITAL_COMMUNITY): Payer: 59

## 2022-08-09 ENCOUNTER — Emergency Department (HOSPITAL_COMMUNITY): Payer: 59

## 2022-08-09 ENCOUNTER — Other Ambulatory Visit: Payer: Self-pay

## 2022-08-09 ENCOUNTER — Observation Stay (HOSPITAL_COMMUNITY): Payer: 59

## 2022-08-09 ENCOUNTER — Encounter (HOSPITAL_COMMUNITY): Payer: Self-pay | Admitting: Emergency Medicine

## 2022-08-09 ENCOUNTER — Inpatient Hospital Stay (HOSPITAL_COMMUNITY)
Admission: EM | Admit: 2022-08-09 | Discharge: 2022-08-19 | DRG: 291 | Disposition: A | Discharge status: Other Acute Inpatient Hospital | Payer: 59 | Attending: Internal Medicine | Admitting: Internal Medicine

## 2022-08-09 DIAGNOSIS — R001 Bradycardia, unspecified: Secondary | ICD-10-CM | POA: Diagnosis present

## 2022-08-09 DIAGNOSIS — J9621 Acute and chronic respiratory failure with hypoxia: Secondary | ICD-10-CM | POA: Diagnosis not present

## 2022-08-09 DIAGNOSIS — J189 Pneumonia, unspecified organism: Principal | ICD-10-CM

## 2022-08-09 DIAGNOSIS — J9601 Acute respiratory failure with hypoxia: Secondary | ICD-10-CM | POA: Diagnosis not present

## 2022-08-09 DIAGNOSIS — I5042 Chronic combined systolic (congestive) and diastolic (congestive) heart failure: Secondary | ICD-10-CM | POA: Diagnosis present

## 2022-08-09 DIAGNOSIS — Z87442 Personal history of urinary calculi: Secondary | ICD-10-CM

## 2022-08-09 DIAGNOSIS — Z7982 Long term (current) use of aspirin: Secondary | ICD-10-CM

## 2022-08-09 DIAGNOSIS — I2510 Atherosclerotic heart disease of native coronary artery without angina pectoris: Secondary | ICD-10-CM | POA: Diagnosis not present

## 2022-08-09 DIAGNOSIS — Z66 Do not resuscitate: Secondary | ICD-10-CM | POA: Diagnosis present

## 2022-08-09 DIAGNOSIS — D696 Thrombocytopenia, unspecified: Secondary | ICD-10-CM | POA: Diagnosis present

## 2022-08-09 DIAGNOSIS — I350 Nonrheumatic aortic (valve) stenosis: Secondary | ICD-10-CM

## 2022-08-09 DIAGNOSIS — I8500 Esophageal varices without bleeding: Secondary | ICD-10-CM | POA: Diagnosis present

## 2022-08-09 DIAGNOSIS — I110 Hypertensive heart disease with heart failure: Secondary | ICD-10-CM | POA: Diagnosis not present

## 2022-08-09 DIAGNOSIS — K5900 Constipation, unspecified: Secondary | ICD-10-CM | POA: Diagnosis present

## 2022-08-09 DIAGNOSIS — E785 Hyperlipidemia, unspecified: Secondary | ICD-10-CM | POA: Diagnosis not present

## 2022-08-09 DIAGNOSIS — Z6834 Body mass index (BMI) 34.0-34.9, adult: Secondary | ICD-10-CM

## 2022-08-09 DIAGNOSIS — I252 Old myocardial infarction: Secondary | ICD-10-CM

## 2022-08-09 DIAGNOSIS — K7460 Unspecified cirrhosis of liver: Secondary | ICD-10-CM | POA: Diagnosis present

## 2022-08-09 DIAGNOSIS — K7469 Other cirrhosis of liver: Secondary | ICD-10-CM

## 2022-08-09 DIAGNOSIS — Z9842 Cataract extraction status, left eye: Secondary | ICD-10-CM

## 2022-08-09 DIAGNOSIS — Z95818 Presence of other cardiac implants and grafts: Secondary | ICD-10-CM

## 2022-08-09 DIAGNOSIS — Z9841 Cataract extraction status, right eye: Secondary | ICD-10-CM

## 2022-08-09 DIAGNOSIS — Z83438 Family history of other disorder of lipoprotein metabolism and other lipidemia: Secondary | ICD-10-CM

## 2022-08-09 DIAGNOSIS — I452 Bifascicular block: Secondary | ICD-10-CM | POA: Diagnosis present

## 2022-08-09 DIAGNOSIS — Z79899 Other long term (current) drug therapy: Secondary | ICD-10-CM

## 2022-08-09 DIAGNOSIS — E876 Hypokalemia: Secondary | ICD-10-CM | POA: Diagnosis present

## 2022-08-09 DIAGNOSIS — I272 Pulmonary hypertension, unspecified: Secondary | ICD-10-CM | POA: Diagnosis present

## 2022-08-09 DIAGNOSIS — F419 Anxiety disorder, unspecified: Secondary | ICD-10-CM | POA: Diagnosis present

## 2022-08-09 DIAGNOSIS — J8410 Pulmonary fibrosis, unspecified: Secondary | ICD-10-CM | POA: Diagnosis present

## 2022-08-09 DIAGNOSIS — E8809 Other disorders of plasma-protein metabolism, not elsewhere classified: Secondary | ICD-10-CM | POA: Diagnosis present

## 2022-08-09 DIAGNOSIS — Z7984 Long term (current) use of oral hypoglycemic drugs: Secondary | ICD-10-CM

## 2022-08-09 DIAGNOSIS — E669 Obesity, unspecified: Secondary | ICD-10-CM | POA: Diagnosis present

## 2022-08-09 DIAGNOSIS — K2990 Gastroduodenitis, unspecified, without bleeding: Secondary | ICD-10-CM | POA: Diagnosis present

## 2022-08-09 DIAGNOSIS — Z9981 Dependence on supplemental oxygen: Secondary | ICD-10-CM

## 2022-08-09 DIAGNOSIS — K766 Portal hypertension: Secondary | ICD-10-CM | POA: Diagnosis present

## 2022-08-09 DIAGNOSIS — I5033 Acute on chronic diastolic (congestive) heart failure: Secondary | ICD-10-CM | POA: Diagnosis not present

## 2022-08-09 DIAGNOSIS — I10 Essential (primary) hypertension: Secondary | ICD-10-CM | POA: Diagnosis present

## 2022-08-09 DIAGNOSIS — L819 Disorder of pigmentation, unspecified: Secondary | ICD-10-CM | POA: Diagnosis present

## 2022-08-09 DIAGNOSIS — I8510 Secondary esophageal varices without bleeding: Secondary | ICD-10-CM | POA: Diagnosis present

## 2022-08-09 DIAGNOSIS — Z818 Family history of other mental and behavioral disorders: Secondary | ICD-10-CM

## 2022-08-09 DIAGNOSIS — I480 Paroxysmal atrial fibrillation: Secondary | ICD-10-CM | POA: Diagnosis present

## 2022-08-09 DIAGNOSIS — Z515 Encounter for palliative care: Secondary | ICD-10-CM

## 2022-08-09 DIAGNOSIS — K2970 Gastritis, unspecified, without bleeding: Secondary | ICD-10-CM | POA: Diagnosis present

## 2022-08-09 DIAGNOSIS — Z1152 Encounter for screening for COVID-19: Secondary | ICD-10-CM

## 2022-08-09 DIAGNOSIS — Z951 Presence of aortocoronary bypass graft: Secondary | ICD-10-CM

## 2022-08-09 DIAGNOSIS — Z86010 Personal history of colonic polyps: Secondary | ICD-10-CM

## 2022-08-09 DIAGNOSIS — Z952 Presence of prosthetic heart valve: Secondary | ICD-10-CM

## 2022-08-09 DIAGNOSIS — G4700 Insomnia, unspecified: Secondary | ICD-10-CM | POA: Diagnosis present

## 2022-08-09 DIAGNOSIS — Z961 Presence of intraocular lens: Secondary | ICD-10-CM | POA: Diagnosis present

## 2022-08-09 DIAGNOSIS — Z8249 Family history of ischemic heart disease and other diseases of the circulatory system: Secondary | ICD-10-CM

## 2022-08-09 DIAGNOSIS — J849 Interstitial pulmonary disease, unspecified: Secondary | ICD-10-CM

## 2022-08-09 LAB — COMPREHENSIVE METABOLIC PANEL
ALT: 20 U/L (ref 0–44)
AST: 38 U/L (ref 15–41)
Albumin: 2.7 g/dL — ABNORMAL LOW (ref 3.5–5.0)
Alkaline Phosphatase: 64 U/L (ref 38–126)
Anion gap: 16 — ABNORMAL HIGH (ref 5–15)
BUN: 10 mg/dL (ref 8–23)
CO2: 23 mmol/L (ref 22–32)
Calcium: 9 mg/dL (ref 8.9–10.3)
Chloride: 101 mmol/L (ref 98–111)
Creatinine, Ser: 0.95 mg/dL (ref 0.61–1.24)
GFR, Estimated: >60 mL/min (ref >60)
Glucose, Bld: 134 mg/dL — ABNORMAL HIGH (ref 70–99)
Potassium: 3.3 mmol/L — ABNORMAL LOW (ref 3.5–5.1)
Sodium: 140 mmol/L (ref 135–145)
Total Bilirubin: 4.6 mg/dL — ABNORMAL HIGH (ref 0.3–1.2)
Total Protein: 6.4 g/dL — ABNORMAL LOW (ref 6.5–8.1)

## 2022-08-09 LAB — CBC WITH DIFFERENTIAL/PLATELET
Abs Immature Granulocytes: 0.02 10*3/uL (ref 0.00–0.07)
Basophils Absolute: 0 10*3/uL (ref 0.0–0.1)
Basophils Relative: 1 %
Eosinophils Absolute: 0.2 10*3/uL (ref 0.0–0.5)
Eosinophils Relative: 3 %
HCT: 34.4 % — ABNORMAL LOW (ref 39.0–52.0)
Hemoglobin: 11.2 g/dL — ABNORMAL LOW (ref 13.0–17.0)
Immature Granulocytes: 0 %
Lymphocytes Relative: 17 %
Lymphs Abs: 0.9 10*3/uL (ref 0.7–4.0)
MCH: 31.7 pg (ref 26.0–34.0)
MCHC: 32.6 g/dL (ref 30.0–36.0)
MCV: 97.5 fL (ref 80.0–100.0)
Monocytes Absolute: 0.4 10*3/uL (ref 0.1–1.0)
Monocytes Relative: 8 %
Neutro Abs: 3.6 10*3/uL (ref 1.7–7.7)
Neutrophils Relative %: 71 %
Platelets: 57 10*3/uL — ABNORMAL LOW (ref 150–400)
RBC: 3.53 MIL/uL — ABNORMAL LOW (ref 4.22–5.81)
RDW: 17.6 % — ABNORMAL HIGH (ref 11.5–15.5)
WBC: 5.1 10*3/uL (ref 4.0–10.5)
nRBC: 0 % (ref 0.0–0.2)

## 2022-08-09 LAB — BRAIN NATRIURETIC PEPTIDE: B Natriuretic Peptide: 1031.6 pg/mL — ABNORMAL HIGH (ref 0.0–100.0)

## 2022-08-09 LAB — TROPONIN I (HIGH SENSITIVITY)
Troponin I (High Sensitivity): 39 ng/L — ABNORMAL HIGH (ref <18)
Troponin I (High Sensitivity): 38 ng/L — ABNORMAL HIGH (ref <18)

## 2022-08-09 LAB — SARS CORONAVIRUS 2 BY RT PCR: SARS Coronavirus 2 by RT PCR: NEGATIVE

## 2022-08-09 LAB — MAGNESIUM: Magnesium: 2 mg/dL (ref 1.7–2.4)

## 2022-08-09 MED ORDER — IOHEXOL 350 MG/ML SOLN
75.0000 mL | Freq: Once | INTRAVENOUS | Status: AC | PRN
  Administered 2022-08-09: 75 mL via INTRAVENOUS

## 2022-08-09 MED ORDER — ASPIRIN 81 MG PO TBEC
81.0000 mg | DELAYED_RELEASE_TABLET | Freq: Every day | ORAL | Status: DC
  Administered 2022-08-16 – 2022-08-19 (×4): 81 mg via ORAL
  Filled 2022-08-09 (×9): qty 1

## 2022-08-09 MED ORDER — CARVEDILOL 3.125 MG PO TABS
3.1250 mg | ORAL_TABLET | Freq: Two times a day (BID) | ORAL | Status: DC
  Administered 2022-08-10 – 2022-08-11 (×4): 3.125 mg via ORAL
  Filled 2022-08-09 (×5): qty 1

## 2022-08-09 MED ORDER — POTASSIUM CHLORIDE CRYS ER 20 MEQ PO TBCR
40.0000 meq | EXTENDED_RELEASE_TABLET | Freq: Once | ORAL | Status: AC
  Administered 2022-08-09: 40 meq via ORAL
  Filled 2022-08-09: qty 2

## 2022-08-09 MED ORDER — FUROSEMIDE 10 MG/ML IJ SOLN: 40.0000 mg | Freq: Once | INTRAMUSCULAR | Status: DC

## 2022-08-09 MED ORDER — SODIUM CHLORIDE 0.9 % IV SOLN
500.0000 mg | Freq: Once | INTRAVENOUS | Status: AC
  Administered 2022-08-09: 500 mg via INTRAVENOUS
  Filled 2022-08-09: qty 5

## 2022-08-09 MED ORDER — PIRFENIDONE 267 MG PO TABS
801.0000 mg | ORAL_TABLET | Freq: Three times a day (TID) | ORAL | Status: DC
  Administered 2022-08-11 – 2022-08-19 (×25): 801 mg via ORAL
  Filled 2022-08-09 (×24): qty 3

## 2022-08-09 MED ORDER — FUROSEMIDE 10 MG/ML IJ SOLN
80.0000 mg | Freq: Two times a day (BID) | INTRAMUSCULAR | Status: DC
  Administered 2022-08-10 – 2022-08-19 (×18): 80 mg via INTRAVENOUS
  Filled 2022-08-09 (×19): qty 8

## 2022-08-09 MED ORDER — ATORVASTATIN CALCIUM 80 MG PO TABS
80.0000 mg | ORAL_TABLET | Freq: Every day | ORAL | Status: DC
  Administered 2022-08-10 – 2022-08-19 (×10): 80 mg via ORAL
  Filled 2022-08-09: qty 2
  Filled 2022-08-09 (×9): qty 1

## 2022-08-09 MED ORDER — FUROSEMIDE 10 MG/ML IJ SOLN
40.0000 mg | Freq: Once | INTRAMUSCULAR | Status: AC
  Administered 2022-08-09: 40 mg via INTRAVENOUS
  Filled 2022-08-09: qty 4

## 2022-08-09 MED ORDER — EMPAGLIFLOZIN 10 MG PO TABS
10.0000 mg | ORAL_TABLET | Freq: Every day | ORAL | Status: DC
  Administered 2022-08-10 – 2022-08-19 (×10): 10 mg via ORAL
  Filled 2022-08-09 (×10): qty 1

## 2022-08-09 MED ORDER — SODIUM CHLORIDE 0.9% FLUSH
3.0000 mL | Freq: Two times a day (BID) | INTRAVENOUS | Status: DC
  Administered 2022-08-10 – 2022-08-17 (×15): 3 mL via INTRAVENOUS
  Administered 2022-08-18: 10 mL via INTRAVENOUS
  Administered 2022-08-18 – 2022-08-19 (×2): 3 mL via INTRAVENOUS

## 2022-08-09 MED ORDER — SODIUM CHLORIDE 0.9 % IV SOLN
1.0000 g | Freq: Once | INTRAVENOUS | Status: AC
  Administered 2022-08-09: 1 g via INTRAVENOUS
  Filled 2022-08-09: qty 10

## 2022-08-09 NOTE — ED Notes (Signed)
RT made aware of BiPap order.

## 2022-08-09 NOTE — Progress Notes (Signed)
RT assessed patient per PRN bipap order. Patient sating 92% on 6L upon RT arrival. RT placed patient on 10L [place]. Patient not in distress at this time. RN at bedside aware. RT spoke with ED MD, and MD agreed on RT assessment. RT will continue to monitor as needed.

## 2022-08-09 NOTE — ED Provider Notes (Signed)
EMERGENCY DEPARTMENT AT [facility] Provider Note   CSN: [id number] Arrival date & time: 08/09/22  1338     History  Chief Complaint  Patient presents with   Shortness of Breath   Hypoxia    [name] is a 72 y.o. male.   Shortness of Breath    72 year old male with medical history significant for pulmonary fibrosis and interstitial lung disease, chronic respiratory failure on 5 L O2 at baseline, aortic stenosis status post TAVR, atrial fibrillation status post Watchman procedure, CAD/CABG who presents to the emergency department with shortness of breath.  The patient states that he has had shortness of breath and bilateral lower extremity edema that is worsening over the past 3 days.  He endorses a weight gain and worsening fluid overload.  He denies any ongoing chest discomfort.  With EMS, the patient was found to be hypoxic in the 80s on 6 L and was placed on a nonrebreather at 15 L.  Home Medications Prior to Admission medications   Medication Sig Start Date End Date Taking? Authorizing Provider  acetaminophen (TYLENOL) 500 MG tablet Take 1,000 mg by mouth daily as needed for moderate pain or headache.   Yes [provider]  atorvastatin (LIPITOR) 80 MG tablet TAKE 1 TABLET BY MOUTH EVERY DAY Patient taking differently: Take 80 mg by mouth every evening. 04/26/22  Yes Philip Aspen, Limmie Patricia, MD  carvedilol (COREG) 3.125 MG tablet TAKE 1 TABLET BY MOUTH TWICE A DAY WITH FOOD 05/03/22  Yes Armbruster, Willaim Rayas, MD  [employer] VITAMIN B12 1000 MCG tablet TAKE 1 TABLET BY MOUTH EVERY DAY 01/23/22  Yes Jaci Standard, MD  furosemide (LASIX) 40 MG tablet Take 80 mg by mouth daily. 07/17/22  Yes [provider]  JARDIANCE 10 MG TABS tablet TAKE 1 TABLET BY MOUTH DAILY BEFORE BREAKFAST. 07/05/22  Yes Crenshaw, Madolyn Frieze, MD  KLOR-CON M20 20 MEQ tablet TAKE 1 TABLET BY MOUTH EVERY DAY 12/29/21  Yes Crenshaw, Madolyn Frieze, MD  Misc Natural Products (LIVER  PROTECT PO) Take 1 capsule by mouth 2 (two) times daily. From [employer]   Yes [provider]  OXYGEN Inhale into the lungs.   Yes [provider]  Pirfenidone 267 MG TABS Take 3 tablets (801 mg total) by mouth 3 (three) times daily with meals. 05/18/22  Yes Mannam, Praveen, MD  tamsulosin (FLOMAX) 0.4 MG CAPS capsule TAKE ONE CAPSULE BY MOUTH EVERY DAY AFTER SUPPER Patient taking differently: Take 0.4 mg by mouth daily. 04/26/22  Yes Philip Aspen, Limmie Patricia, MD  torsemide 60 MG TABS Take 60 mg by mouth daily. If your weight increases to 205lb or more, take an extra 60mg  of torsemide. Patient taking differently: Take 60 mg by mouth daily. 05/11/22  Yes Croitoru, Mihai, MD  amoxicillin (AMOXIL) 500 MG tablet Take 4 tablets (2,000 mg total) by mouth as directed. 1 HOUR PRIOR TO DENTAL APPOINTMENTS Patient not taking: Reported on 08/09/2022 03/29/22   Filbert Schilder, NP  aspirin EC 81 MG tablet Take 1 tablet (81 mg total) by mouth daily. Swallow whole. Patient not taking: Reported on 08/09/2022 03/22/22   Tereso Newcomer T, PA-C      Allergies    Patient has no known allergies.    Review of Systems   Review of Systems  Respiratory:  Positive for shortness of breath.   Cardiovascular:  Positive for leg swelling.  All other systems reviewed and are negative.  Physical Exam Updated Vital Signs BP (!) 123/53   Pulse 69   Temp 98.8 F (37.1 C) (Oral)   Resp 18   Ht 5\' 6"  (1.676 m)   Wt 97.5 kg   SpO2 91%   BMI 34.70 kg/m  Physical Exam Vitals and nursing note reviewed.  Constitutional:      General: He is not in acute distress.    Appearance: He is well-developed.  HENT:     Head: Normocephalic and atraumatic.  Eyes:     Conjunctiva/sclera: Conjunctivae normal.  Cardiovascular:     Rate and Rhythm: Normal rate and regular rhythm.     Heart sounds: No murmur heard. Pulmonary:     Effort: Pulmonary effort is normal. No respiratory distress.     Breath sounds:  Rales present.  Abdominal:     Palpations: Abdomen is soft.     Tenderness: There is no abdominal tenderness.  Musculoskeletal:        General: No swelling.     Cervical back: Neck supple.     Right lower leg: Edema present.     Left lower leg: Edema present.  Skin:    General: Skin is warm and dry.     Capillary Refill: Capillary refill takes less than 2 seconds.  Neurological:     Mental Status: He is alert.  Psychiatric:        Mood and Affect: Mood normal.     ED Results / Procedures / Treatments   Labs (all labs ordered are listed, but only abnormal results are displayed) Labs Reviewed  CBC WITH DIFFERENTIAL/PLATELET - Abnormal; Notable for the following components:      Result Value   RBC 3.53 (*)    Hemoglobin 11.2 (*)    HCT 34.4 (*)    RDW 17.6 (*)    Platelets 57 (*)    All other components within normal limits  COMPREHENSIVE METABOLIC PANEL - Abnormal; Notable for the following components:   Potassium 3.3 (*)    Glucose, Bld 134 (*)    Total Protein 6.4 (*)    Albumin 2.7 (*)    Total Bilirubin 4.6 (*)    Anion gap 16 (*)    All other components within normal limits  BRAIN NATRIURETIC PEPTIDE - Abnormal; Notable for the following components:   B Natriuretic Peptide 1,031.6 (*)    All other components within normal limits  TROPONIN I (HIGH SENSITIVITY) - Abnormal; Notable for the following components:   Troponin I (High Sensitivity) 39 (*)    All other components within normal limits  TROPONIN I (HIGH SENSITIVITY) - Abnormal; Notable for the following components:   Troponin I (High Sensitivity) 38 (*)    All other components within normal limits  SARS CORONAVIRUS 2 BY RT PCR  RESPIRATORY PANEL BY PCR  MAGNESIUM  PROTIME-INR    EKG EKG Interpretation Date/Time:  Monday August 09 2022 14:40:42 EDT Ventricular Rate:  69 PR Interval:  47 QRS Duration:  165 QT Interval:  483 QTC Calculation: 518 R Axis:   -74  Text Interpretation: Sinus rhythm  Multiple premature complexes, vent & supraven Short PR interval RBBB and LAFB LVH by voltage Anterior Q waves, possibly due to LVH Abnormal T, consider ischemia, lateral leads Nonspecific ST abnormality Confirmed by Ernie Avena ([id number]) on 08/09/2022 4:00:43 PM  Radiology CT Angio Chest PE W and/or Wo Contrast  Result Date: 08/09/2022 CLINICAL DATA:  Pulmonary embolism (PE) suspected, high prob Hypoxia and shortness of breath. EXAM:  CT ANGIOGRAPHY CHEST WITH CONTRAST TECHNIQUE: Multidetector CT imaging of the chest was performed using the standard protocol during bolus administration of intravenous contrast. Multiplanar CT image reconstructions and MIPs were obtained to evaluate the vascular anatomy. RADIATION DOSE REDUCTION: This exam was performed according to the departmental dose-optimization program which includes automated exposure control, adjustment of the mA and/or kV according to patient size and/or use of iterative reconstruction technique. CONTRAST:  75mL OMNIPAQUE IOHEXOL 350 MG/ML SOLN COMPARISON:  Radiograph earlier today CT 02/24/2022 FINDINGS: Cardiovascular: There are no filling defects within the pulmonary arteries to suggest pulmonary embolus. Assessment is diagnostic to the segmental level. The subsegmental branches are not well assessed. Aortic atherosclerosis. Aortic valve replacement. Left atrial occlusion device in place. Multi chamber cardiomegaly. Coronary artery calcifications. Mediastinum/Nodes: Scattered mediastinal lymph nodes not enlarged by size criteria. No suspicious thoracic adenopathy. Patulous esophagus. Small hiatal hernia. Lungs/Pleura: Small bilateral pleural effusions with associated atelectasis. Small amount of fluid tracks into the right major fissure. Chronic subpleural reticulation and ground-glass. Scattered punctate calcified granuloma. Overall low lung volumes. Upper Abdomen: Hepatic cirrhosis. Splenomegaly only partially included. Gallstones. Musculoskeletal: Prior  median sternotomy. Diffuse thoracic spurring. There are no acute or suspicious osseous abnormalities. Mild generalized body wall edema. Review of the MIP images confirms the above findings. IMPRESSION: 1. No pulmonary embolus. 2. Small bilateral pleural effusions with associated atelectasis. 3. Chronic subpleural reticulation and ground-glass, typical of interstitial lung disease. 4. Multi chamber cardiomegaly. Coronary artery calcifications. 5. Hepatic cirrhosis. Splenomegaly only partially included. 6. Cholelithiasis. Aortic Atherosclerosis (ICD10-I70.0). Electronically Signed   By: Narda Rutherford M.D.   On: 08/09/2022 18:28   DG Chest Portable 1 View  Result Date: 08/09/2022 CLINICAL DATA:  Shortness of breath EXAM: PORTABLE CHEST 1 VIEW COMPARISON:  Previous studies including the examination of 07/16/2022 FINDINGS: Transverse diameter of heart is increased. There are no signs of alveolar pulmonary edema. There is poor inspiration. Patchy densities are seen in left lower lung field. There is improvement in aeration in right lower lung field. There is no significant pleural effusion or pneumothorax. There is previous coronary bypass surgery. Prosthetic cardiac valve is seen. IMPRESSION: Cardiomegaly. There are no signs of alveolar pulmonary edema. Increased markings in the left lower lung field may suggest subsegmental atelectasis or pneumonia. Electronically Signed   By: Ernie Avena M.D.   On: 08/09/2022 15:08    Procedures Procedures    Medications Ordered in ED Medications  aspirin EC tablet 81 mg (has no administration in time range)  carvedilol (COREG) tablet 3.125 mg (3.125 mg Oral Not Given 08/09/22 1908)  atorvastatin (LIPITOR) tablet 80 mg (has no administration in time range)  empagliflozin (JARDIANCE) tablet 10 mg (has no administration in time range)  Pirfenidone TABS 801 mg (has no administration in time range)  furosemide (LASIX) injection 80 mg (has no administration in time  range)  sodium chloride flush (NS) 0.9 % injection 3 mL (3 mLs Intravenous Not Given 08/09/22 2130)  furosemide (LASIX) injection 40 mg (40 mg Intravenous Not Given 08/09/22 1752)  cefTRIAXone (ROCEPHIN) 1 g in sodium chloride 0.9 % 100 mL IVPB (0 g Intravenous Stopped 08/09/22 1907)  azithromycin (ZITHROMAX) 500 mg in sodium chloride 0.9 % 250 mL IVPB (0 mg Intravenous Stopped 08/09/22 2031)  furosemide (LASIX) injection 40 mg (40 mg Intravenous Given 08/09/22 1716)  potassium chloride SA (KLOR-CON M) CR tablet 40 mEq (40 mEq Oral Given 08/09/22 1819)  iohexol (OMNIPAQUE) 350 MG/ML injection 75 mL (75 mLs Intravenous Contrast Given 08/09/22  [facility])    ED Course/ Medical Decision Making/ A&P                             Medical Decision Making Amount and/or Complexity of Data Reviewed Radiology: ordered.  Risk Prescription drug management. Decision regarding hospitalization.      72 year old male with medical history significant for pulmonary fibrosis and interstitial lung disease, chronic respiratory failure on 5 L O2 at baseline, aortic stenosis status post TAVR, atrial fibrillation status post Watchman procedure, CAD/CABG who presents to the emergency department with shortness of breath.  The patient states that he has had shortness of breath and bilateral lower extremity edema that is worsening over the past 3 days.  He endorses a weight gain and worsening fluid overload.  He denies any ongoing chest discomfort.  With EMS, the patient was found to be hypoxic in the 80s on 6 L and was placed on a nonrebreather at 15 L.  On arrival, the patient was afebrile, not tachycardic, hemodynamically stable BP 128/54, saturating 87% on 7 L, subsequently escalated back to 15 L/min per nonrebreather.  Exam consistent with lower extremity edema and rales.  Concern for CHF exacerbation.  Workup initiated to include EKG which revealed sinus rhythm, ventricular rate 69, no STEMI.  A chest x-ray was concerning for  possible pneumonia.  IV Rocephin and azithromycin was ordered.  Additionally given the patient's volume overload, 40 mg of IV Lasix was administered.  Laboratory evaluation significant for CBC without a leukocytosis, mild anemia to 11.2.  COVID PCR testing was negative, magnesium was normal, BNP was elevated to 8032.  CMP with mild hypokalemia to 3.3 otherwise fairly unremarkable.  Troponins were elevated but flat at 39 and 38.  Low concern for ACS.  CTA PE study ordered to further differentiate the report of possible pneumonia versus pulmonary edema.  Considered worsening interstitial lung disease as well.  CTA imaging pending at time of signout, signout given to Dr. Lockie Mola to reassess the patient with the plan for likely admission pending CT results.   Final Clinical Impression(s) / ED Diagnoses Final diagnoses:  Community acquired pneumonia, unspecified laterality  Acute respiratory failure with hypoxia [facility])    Rx / DC Orders ED Discharge Orders     None         Ernie Avena, MD 08/09/22 2151

## 2022-08-09 NOTE — ED Triage Notes (Signed)
Pt c/o SOB x 3 days associated with increased bilateral leg swelling.   Pt arrived to triage on room air. Sts he is typically on 6-7 L nasal cannula. Was placed on nasal cannula on arrival to triage. Currently 86% on baseline nasal cannula.

## 2022-08-09 NOTE — ED Notes (Signed)
Pt taken off NRB and placed on 5L [place].  O2 remains 91%.

## 2022-08-09 NOTE — ED Notes (Signed)
Per NT, Pt's O2 sat was mid 80s w/ 6L [place].  Pt placed on 15L NRB and sat increased to 100%.

## 2022-08-09 NOTE — ED Notes (Signed)
Patient transported to CT 

## 2022-08-09 NOTE — ED Provider Triage Note (Signed)
Emergency Medicine Provider Triage Evaluation Note  [name] , a 72 y.o. male  was evaluated in triage.  Pt  reports worsening SOB x2 weeks. Typically on 5L O2. Reports chest pain yesterday. Reports productive cough, denies fevers.   Review of Systems  Positive: Chest pain, SOB Negative: fever  Physical Exam  BP (!) 128/54 (BP Location: Left Arm)   Pulse 69   Temp 98.6 F (37 C) (Oral)   Resp (!) 26   Ht 5\' 6"  (1.676 m)   Wt 97.5 kg   SpO2 (!) 87%   BMI 34.70 kg/m  Gen:   Awake, no distress   Resp:  Normal effort, 7L O2, LUL crackles MSK:   Moves extremities without difficulty    Medical Decision Making  Medically screening exam initiated at 1:58 PM.  Appropriate orders placed.  [name] was informed that the remainder of the evaluation will be completed by another provider, this initial triage assessment does not replace that evaluation, and the importance of remaining in the ED until their evaluation is complete.  Hypoxic, will need room ASAP   [name], Tony [place], PA 08/09/22 1359

## 2022-08-09 NOTE — H&P (Signed)
History and Physical   [name] ZOX:[id number] DOB: 12-02-1950 DOA: 08/09/2022  PCP: Philip Aspen, Limmie Patricia, MD   Patient coming from: Home  Chief Complaint: Shortness of breath  HPI: [name] is a 72 y.o. 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  Patient reports 3 days of gradually worsening shortness of breath and lower extremity edema that has been worsening longer. Reports 15 lb weigth gain over the past 2 months or so.  Denies fevers, chills, chest pain, abdominal pain, constipation, diarrhea, nausea, vomiting.  ED Course: Vital signs in the ED notable for blood pressure in the 110s to 130s systolic, respiratory rate in the 20s, hypoxic on home 7 L increased to nonrebreather at 15 L/min.  Lab workup included CMP with potassium 3.3, glucose 134, protein 6.4, albumin 2.7, T. bili 4.6.  CBC with hemoglobin 11.2 which is stable, platelets 57 which is stable.  Troponin mildly elevated at 39 with repeat pending.  BNP significantly elevated to 1031.  COVID screen negative.  Chest x-ray showed cardiomegaly without overt edema and also showed questionable atelectasis versus pneumonia.  Patient received ceftriaxone and azithromycin in the ED as well as a 4 mg IV dose of Lasix.  Review of Systems: As per HPI otherwise all other systems reviewed and are negative.  Past Medical History:  Diagnosis Date   Adjustment disorder with depressed mood 09/07/2007   CAP (community acquired pneumonia) 02/16/2021   Cataract    removed bilaterally   Chronic kidney disease    kidney stones   Cirrhosis (HCC)    Colon polyps    Tubular Adenoma 2010   CONGESTIVE  HEART FAILURE 12/09/2008   CORONARY ARTERY DISEASE 2006   HYPERLIPIDEMIA 08/09/2006   HYPERTENSION 08/09/2006   MYOCARDIAL INFARCTION, HX OF 07/07/2004   NEPHROLITHIASIS, HX OF 08/09/2006   OBESITY 07/19/2008   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 Excell Seltzer   Pulmonary fibrosis [facility])    S/P TAVR (transcatheter aortic valve replacement) 03/16/2022   26mm S3UR via TF approach with Dr. Excell Seltzer and Dr. Leafy Ro   Sleep apnea    has a cpap - not using religiously    Past Surgical History:  Procedure Laterality Date   BIOPSY  11/18/2020   Procedure: BIOPSY;  Surgeon: Benancio Deeds, MD;  Location: [facility] ENDOSCOPY;  Service: Gastroenterology;;   CARDIOVERSION N/A 12/14/2017   Procedure: CARDIOVERSION;  Surgeon: Chilton Si, MD;  Location: [facility] ENDOSCOPY;  Service: Cardiovascular;  Laterality: N/A;   CARDIOVERSION N/A 10/01/2019   Procedure: CARDIOVERSION;  Surgeon: Jodelle Red, MD;  Location: [facility] ENDOSCOPY;  Service: Cardiovascular;  Laterality: N/A;   COLONOSCOPY     COLONOSCOPY WITH PROPOFOL N/A 11/18/2020   Procedure: COLONOSCOPY WITH PROPOFOL;  Surgeon: Benancio Deeds, MD;  Location: [facility] ENDOSCOPY;  Service: Gastroenterology;  Laterality: N/A;   CORONARY ARTERY BYPASS GRAFT  2006   ESOPHAGOGASTRODUODENOSCOPY (EGD) WITH PROPOFOL N/A 11/18/2020   Procedure: ESOPHAGOGASTRODUODENOSCOPY (EGD) WITH PROPOFOL;  Surgeon: Benancio Deeds, MD;  Location: [facility] ENDOSCOPY;  Service: Gastroenterology;  Laterality: N/A;   INTRAOPERATIVE TRANSTHORACIC ECHOCARDIOGRAM N/A 03/16/2022   Procedure: INTRAOPERATIVE TRANSTHORACIC ECHOCARDIOGRAM;  Surgeon: Tonny Bollman, MD;  Location: [facility] INVASIVE CV LAB;  Service:  Open Heart Surgery;  Laterality: N/A;   LEFT ATRIAL APPENDAGE OCCLUSION N/A 12/17/2021   Procedure: LEFT ATRIAL APPENDAGE OCCLUSION;  Surgeon: Tonny Bollman, MD;  Location: [facility] INVASIVE CV LAB;  Service: Cardiovascular;   Laterality: N/A;   POLYPECTOMY     POLYPECTOMY  11/18/2020   Procedure: POLYPECTOMY;  Surgeon: Benancio Deeds, MD;  Location: [facility] ENDOSCOPY;  Service: Gastroenterology;;   RIGHT/LEFT HEART CATH AND CORONARY/GRAFT ANGIOGRAPHY N/A 02/17/2022   Procedure: RIGHT/LEFT HEART CATH AND CORONARY/GRAFT ANGIOGRAPHY;  Surgeon: Tonny Bollman, MD;  Location: [facility] INVASIVE CV LAB;  Service: Cardiovascular;  Laterality: N/A;   TEE WITHOUT CARDIOVERSION N/A 12/17/2021   Procedure: TRANSESOPHAGEAL ECHOCARDIOGRAM (TEE);  Surgeon: Tonny Bollman, MD;  Location: [facility] INVASIVE CV LAB;  Service: Cardiovascular;  Laterality: N/A;   TRANSCATHETER AORTIC VALVE REPLACEMENT, TRANSFEMORAL Right 03/16/2022   Procedure: Transcatheter Aortic Valve Replacement, Transfemoral;  Surgeon: Tonny Bollman, MD;  Location: [facility] INVASIVE CV LAB;  Service: Open Heart Surgery;  Laterality: Right;    Social History  reports that he has never smoked. He has never been exposed to tobacco smoke. He has never used smokeless tobacco. He reports that he does not currently use alcohol. He reports that he does not use drugs.  No Known Allergies  Family History  Problem Relation Age of Onset   Hypertension Mother    Hyperlipidemia Mother    Heart disease Father    Ulcers Father    Bipolar disorder Brother    Colon cancer Neg Hx    Colon polyps Neg Hx    Esophageal cancer Neg Hx    Rectal cancer Neg Hx    Stomach cancer Neg Hx   Reviewed on admission  Prior to Admission medications   Medication Sig Start Date End Date Taking? Authorizing Provider  acetaminophen (TYLENOL) 500 MG tablet Take 1,000 mg by mouth daily as needed for moderate pain or headache.    [provider]  amoxicillin (AMOXIL) 500 MG tablet Take 4 tablets (2,000 mg total) by mouth as directed. 1 HOUR PRIOR TO DENTAL APPOINTMENTS 03/29/22   Georgie Chard D, NP  aspirin EC 81 MG tablet Take 1 tablet (81 mg total) by mouth daily. Swallow whole. 03/22/22   Tereso Newcomer T, PA-C  atorvastatin (LIPITOR) 80 MG tablet TAKE 1 TABLET BY MOUTH EVERY DAY 04/26/22   Philip Aspen, Limmie Patricia, MD  carvedilol (COREG) 3.125 MG tablet TAKE 1 TABLET BY MOUTH TWICE A DAY WITH FOOD 05/03/22   Armbruster, Willaim Rayas, MD  [employer] VITAMIN B12 1000 MCG tablet TAKE 1 TABLET BY MOUTH EVERY DAY 01/23/22   Jaci Standard, MD  JARDIANCE 10 MG TABS tablet TAKE 1 TABLET BY MOUTH DAILY BEFORE BREAKFAST. 07/05/22   Lewayne Bunting, MD  KLOR-CON M20 20 MEQ tablet TAKE 1 TABLET BY MOUTH EVERY DAY 12/29/21   Lewayne Bunting, MD  Misc Natural Products (LIVER PROTECT PO) Take 1 capsule by mouth 2 (two) times daily. From [employer]    [provider]  OXYGEN Inhale into the lungs.    [provider]  Pirfenidone 267 MG TABS Take 3 tablets (801 mg total) by mouth 3 (three) times daily with meals. 05/18/22   Mannam, Colbert Coyer, MD  tamsulosin (FLOMAX) 0.4 MG CAPS capsule TAKE ONE CAPSULE BY MOUTH EVERY DAY AFTER SUPPER 04/26/22   Philip Aspen, Limmie Patricia, MD  torsemide 60 MG TABS Take 60 mg by mouth daily. If your weight increases to 205lb or more, take an extra  60mg  of torsemide. 05/11/22   Croitoru, Rachelle Hora, MD    Physical Exam: Vitals:   08/09/22 1500 08/09/22 1530 08/09/22 1600 08/09/22 1630  BP: (!) 137/57 (!) 114/49 (!) 119/44 (!) 113/48  Pulse: 70 66 66 64  Resp: (!) 25 20 18  (!) 26  Temp:      TempSrc:      SpO2: 97% 100% 100% 99%  Weight:      Height:        Physical Exam Constitutional:      General: He is not in acute distress.    Appearance: Normal appearance.  HENT:     Head: Normocephalic and atraumatic.     Mouth/Throat:     Mouth: Mucous membranes are moist.     Pharynx: Oropharynx is clear.  Eyes:     Extraocular Movements: Extraocular movements intact.     Pupils: Pupils are equal, round, and reactive to light.  Cardiovascular:     Rate and Rhythm: Normal rate and regular rhythm.     Pulses: Normal pulses.     Heart sounds: Murmur heard.   Pulmonary:     Effort: Pulmonary effort is normal. No respiratory distress.     Breath sounds: Rales present.  Abdominal:     General: Bowel sounds are normal. There is no distension.     Palpations: Abdomen is soft.     Tenderness: There is no abdominal tenderness.  Musculoskeletal:        General: No swelling or deformity.     Right lower leg: Edema present.     Left lower leg: Edema present.  Skin:    General: Skin is warm and dry.  Neurological:     General: No focal deficit present.     Mental Status: Mental status is at baseline.    Labs on Admission: I have personally reviewed following labs and imaging studies  CBC: Recent Labs  Lab 08/09/22 1500  WBC 5.1  NEUTROABS 3.6  HGB 11.2*  HCT 34.4*  MCV 97.5  PLT 57*    Basic Metabolic Panel: Recent Labs  Lab 08/09/22 1500  NA 140  K 3.3*  CL 101  CO2 23  GLUCOSE 134*  BUN 10  CREATININE 0.95  CALCIUM 9.0    GFR: Estimated Creatinine Clearance: 76.8 mL/min (by C-G formula based on SCr of 0.95 mg/dL).  Liver Function Tests: Recent Labs  Lab 08/09/22 1500  AST 38  ALT 20  ALKPHOS 64  BILITOT 4.6*  PROT 6.4*  ALBUMIN 2.7*    Urine analysis:    Component Value Date/Time   COLORURINE YELLOW 07/16/2022 0019   APPEARANCEUR HAZY (A) 07/16/2022 0019   LABSPEC 1.011 07/16/2022 0019   PHURINE 5.0 07/16/2022 0019   GLUCOSEU NEGATIVE 07/16/2022 0019   HGBUR SMALL (A) 07/16/2022 0019   HGBUR negative 07/11/2009 0849   BILIRUBINUR NEGATIVE 07/16/2022 0019   BILIRUBINUR n 08/01/2015 0926   KETONESUR NEGATIVE 07/16/2022 0019   PROTEINUR 30 (A) 07/16/2022 0019   UROBILINOGEN 0.2 08/01/2015 0926   UROBILINOGEN 1.0 07/11/2009 0849   NITRITE NEGATIVE 07/16/2022 0019   LEUKOCYTESUR NEGATIVE 07/16/2022 0019    Radiological Exams on Admission: DG Chest Portable 1 View  Result Date: 08/09/2022 CLINICAL DATA:  Shortness of breath EXAM: PORTABLE CHEST 1 VIEW COMPARISON:  Previous studies including the  examination of 07/16/2022 FINDINGS: Transverse diameter of heart is increased. There are no signs of alveolar pulmonary edema. There is poor inspiration. Patchy densities are seen in left lower lung  field. There is improvement in aeration in right lower lung field. There is no significant pleural effusion or pneumothorax. There is previous coronary bypass surgery. Prosthetic cardiac valve is seen. IMPRESSION: Cardiomegaly. There are no signs of alveolar pulmonary edema. Increased markings in the left lower lung field may suggest subsegmental atelectasis or pneumonia. Electronically Signed   By: Ernie Avena M.D.   On: 08/09/2022 15:08    EKG: Independently reviewed.  Sinus rhythm at 69 bpm.  PVCs noted.  Right bundle blanch block with QRS 165.  Evidence of LVH.  Anterior T wave abnormalities in the setting of LVH are similar to previous.  Assessment/Plan Principal Problem:   Acute on chronic respiratory failure with hypoxia (HCC) Active Problems:   Dyslipidemia   Essential hypertension   MYOCARDIAL INFARCTION, HX OF   Coronary atherosclerosis   Chronic combined systolic and diastolic heart failure (HCC)   Severe aortic stenosis   Hepatic cirrhosis (HCC)   Gastritis and gastroduodenitis   Esophageal varices without bleeding (HCC)   PAF (paroxysmal atrial fibrillation) (HCC)   Presence of Watchman left atrial appendage closure device   S/P TAVR (transcatheter aortic valve replacement)   ILD (interstitial lung disease) (HCC)   Acute on chronic respiratory failure with hypoxia Acute on chronic diastolic CHF > Patient presenting with several days of worsening shortness of breath and edema. > Chronically on 5 to 7 L of supplemental oxygen in the setting of CHF, ILD, AAS status post TAVR.  Now on nonrebreather at 15 L/min. > Chest x-ray equivocal with evidence of atelectasis versus pneumonia, cardiomegaly and no overt edema.  CTA added later was negative for PE, did show small bilateral  effusions with atelectasis as well as groundglass and subpleural reticulation related to his ILD.  Cardiomegaly and cirrhosis noted as well. > Significant evidence of volume overload on exam however and BNP elevated to 1031. > Received ceftriaxone and azithromycin in the ED to cover for pneumonia but there is no leukocytosis so we will hold off on further antibiotics but will check respiratory viral panel for possible viral etiology. > Received 40 mg IV Lasix in the ED will add additional 4 mg considering home dose is 60 mg p.o. torsemide. - Monitor on progressive - Continue with supplemental oxygen, wean as tolerated - As needed BiPAP if patient fails to improve on supplemental oxygen alone - Continue with Lasix 80 mg twice daily - Strict strict I's and O's, daily weights - Check magnesium - Trend renal function and electrolytes and correct electrolytes as needed - Respiratory viral panel to rule out possible viral pneumonia contributing to respiratory failure - Continue home Jardiance, Coreg  ILD Pulmonary fibrosis > History of this on chronic oxygen as above.  Follows with pulmonology. - Continue home pirfenidone  CAD HLD > Status post CABG - Continue home carvedilol, atorvastatin, aspirin  Atrial fibrillation > Status post Watchman procedure - Continue home carvedilol  Aortic stenosis > Status post TAVR - Noted  Cirrhosis Thrombocytopenia > History of nonbleeding varices  > T. bili elevated 4.6.  Platelets stable at 57.  Will check PT and INR while here.  LFTs normal. - Diuresis as above  Hypertension - On diuretics as above - Continue home carvedilol  Obesity - Noted  DVT prophylaxis: SCDs Code Status:   Full Family Communication:  Updated at bedside Disposition Plan:   Patient is from:  Home  Anticipated DC to:  Home  Anticipated DC date:  1 to 5 days  Anticipated DC  barriers: None  Consults called:  None Admission status:  Observation,  progressive  Severity of Illness: The appropriate patient status for this patient is OBSERVATION. Observation status is judged to be reasonable and necessary in order to provide the required intensity of service to ensure the patient's safety. The patient's presenting symptoms, physical exam findings, and initial radiographic and laboratory data in the context of their medical condition is felt to place them at decreased risk for further clinical deterioration. Furthermore, it is anticipated that the patient will be medically stable for discharge from the hospital within 2 midnights of admission.    Synetta Fail MD [facility]  How to contact the [facility] Attending or Consulting provider 7A - 7P or covering provider during after hours 7P -7A, for this patient?   Check the care team in [facility] and look for a) attending/consulting [facility] provider listed and b) the [facility] team listed Log into [email or web address] and use [facility] universal password to access. If you do not have the password, please contact the hospital operator. Locate the [facility] provider you are looking for under [facility] and page to a number that you can be directly reached. If you still have difficulty reaching the provider, please page the [facility] (Director on Call) for the Hospitalists listed on amion for assistance.  08/09/2022, 5:35 PM

## 2022-08-10 ENCOUNTER — Ambulatory Visit: Payer: 59 | Admitting: Pulmonary Disease

## 2022-08-10 ENCOUNTER — Encounter (HOSPITAL_COMMUNITY): Payer: 59

## 2022-08-10 ENCOUNTER — Inpatient Hospital Stay (HOSPITAL_COMMUNITY): Payer: 59

## 2022-08-10 DIAGNOSIS — I272 Pulmonary hypertension, unspecified: Secondary | ICD-10-CM | POA: Diagnosis present

## 2022-08-10 DIAGNOSIS — Z79899 Other long term (current) drug therapy: Secondary | ICD-10-CM | POA: Diagnosis not present

## 2022-08-10 DIAGNOSIS — J8410 Pulmonary fibrosis, unspecified: Secondary | ICD-10-CM | POA: Diagnosis present

## 2022-08-10 DIAGNOSIS — J849 Interstitial pulmonary disease, unspecified: Secondary | ICD-10-CM | POA: Diagnosis not present

## 2022-08-10 DIAGNOSIS — E8809 Other disorders of plasma-protein metabolism, not elsewhere classified: Secondary | ICD-10-CM | POA: Diagnosis present

## 2022-08-10 DIAGNOSIS — Z1152 Encounter for screening for COVID-19: Secondary | ICD-10-CM | POA: Diagnosis not present

## 2022-08-10 DIAGNOSIS — Z952 Presence of prosthetic heart valve: Secondary | ICD-10-CM | POA: Diagnosis not present

## 2022-08-10 DIAGNOSIS — I8510 Secondary esophageal varices without bleeding: Secondary | ICD-10-CM | POA: Diagnosis present

## 2022-08-10 DIAGNOSIS — I2510 Atherosclerotic heart disease of native coronary artery without angina pectoris: Secondary | ICD-10-CM | POA: Diagnosis not present

## 2022-08-10 DIAGNOSIS — Z9981 Dependence on supplemental oxygen: Secondary | ICD-10-CM | POA: Diagnosis not present

## 2022-08-10 DIAGNOSIS — J84112 Idiopathic pulmonary fibrosis: Secondary | ICD-10-CM | POA: Diagnosis not present

## 2022-08-10 DIAGNOSIS — D696 Thrombocytopenia, unspecified: Secondary | ICD-10-CM | POA: Diagnosis present

## 2022-08-10 DIAGNOSIS — Z515 Encounter for palliative care: Secondary | ICD-10-CM | POA: Diagnosis not present

## 2022-08-10 DIAGNOSIS — K7460 Unspecified cirrhosis of liver: Secondary | ICD-10-CM | POA: Diagnosis present

## 2022-08-10 DIAGNOSIS — G4700 Insomnia, unspecified: Secondary | ICD-10-CM | POA: Diagnosis present

## 2022-08-10 DIAGNOSIS — I5033 Acute on chronic diastolic (congestive) heart failure: Secondary | ICD-10-CM | POA: Diagnosis present

## 2022-08-10 DIAGNOSIS — I110 Hypertensive heart disease with heart failure: Secondary | ICD-10-CM | POA: Diagnosis present

## 2022-08-10 DIAGNOSIS — E669 Obesity, unspecified: Secondary | ICD-10-CM | POA: Diagnosis present

## 2022-08-10 DIAGNOSIS — E876 Hypokalemia: Secondary | ICD-10-CM | POA: Diagnosis present

## 2022-08-10 DIAGNOSIS — K2970 Gastritis, unspecified, without bleeding: Secondary | ICD-10-CM | POA: Diagnosis not present

## 2022-08-10 DIAGNOSIS — I452 Bifascicular block: Secondary | ICD-10-CM | POA: Diagnosis present

## 2022-08-10 DIAGNOSIS — R001 Bradycardia, unspecified: Secondary | ICD-10-CM | POA: Diagnosis present

## 2022-08-10 DIAGNOSIS — Z66 Do not resuscitate: Secondary | ICD-10-CM | POA: Diagnosis present

## 2022-08-10 DIAGNOSIS — K7469 Other cirrhosis of liver: Secondary | ICD-10-CM | POA: Diagnosis not present

## 2022-08-10 DIAGNOSIS — I10 Essential (primary) hypertension: Secondary | ICD-10-CM | POA: Diagnosis not present

## 2022-08-10 DIAGNOSIS — J9601 Acute respiratory failure with hypoxia: Secondary | ICD-10-CM | POA: Diagnosis present

## 2022-08-10 DIAGNOSIS — E785 Hyperlipidemia, unspecified: Secondary | ICD-10-CM | POA: Diagnosis present

## 2022-08-10 DIAGNOSIS — I480 Paroxysmal atrial fibrillation: Secondary | ICD-10-CM | POA: Diagnosis present

## 2022-08-10 DIAGNOSIS — J9621 Acute and chronic respiratory failure with hypoxia: Secondary | ICD-10-CM | POA: Diagnosis present

## 2022-08-10 DIAGNOSIS — K766 Portal hypertension: Secondary | ICD-10-CM | POA: Diagnosis present

## 2022-08-10 DIAGNOSIS — Z951 Presence of aortocoronary bypass graft: Secondary | ICD-10-CM | POA: Diagnosis not present

## 2022-08-10 LAB — PROTIME-INR
INR: 1.8 — ABNORMAL HIGH (ref 0.8–1.2)
Prothrombin Time: 21.3 seconds — ABNORMAL HIGH (ref 11.4–15.2)

## 2022-08-10 MED ORDER — POTASSIUM CHLORIDE CRYS ER 20 MEQ PO TBCR
40.0000 meq | EXTENDED_RELEASE_TABLET | Freq: Once | ORAL | Status: AC
  Administered 2022-08-10: 40 meq via ORAL
  Filled 2022-08-10: qty 2

## 2022-08-10 MED ORDER — SPIRONOLACTONE 25 MG PO TABS
25.0000 mg | ORAL_TABLET | Freq: Every day | ORAL | Status: DC
  Administered 2022-08-10 – 2022-08-19 (×10): 25 mg via ORAL
  Filled 2022-08-10 (×5): qty 1
  Filled 2022-08-10: qty 2
  Filled 2022-08-10 (×4): qty 1

## 2022-08-10 NOTE — ED Notes (Signed)
ED TO INPATIENT HANDOFF REPORT  ED Nurse Name and Phone #: [phone]  S Name/Age/Gender [name] 72 y.o. male Room/Bed: 012C/012C  Code Status   Code Status: DNR  Home/SNF/Other Home Patient oriented to: self, place, time, and situation Is this baseline? Yes   Triage Complete: Triage complete  Chief Complaint Acute on chronic respiratory failure with hypoxia (HCC) [J96.21]  Triage Note Pt c/o SOB x 3 days associated with increased bilateral leg swelling.   Pt arrived to triage on room air. Sts he is typically on 6-7 L nasal cannula. Was placed on nasal cannula on arrival to triage. Currently 86% on baseline nasal cannula.    Allergies No Known Allergies  Level of Care/Admitting Diagnosis ED Disposition     ED Disposition  Admit   Condition  --   Comment  Hospital Area: [facility] [[id number]]  Level of Care: Progressive [102]  Admit to Progressive based on following criteria: RESPIRATORY PROBLEMS hypoxemic/hypercapnic respiratory failure that is responsive to NIPPV (BiPAP) or High Flow Nasal Cannula (6-80 lpm). Frequent assessment/intervention, no > Q2 hrs < Q4 hrs, to maintain oxygenation and pulmonary hygiene.  May admit patient to [facility] or [facility] if equivalent level of care is available:: No  Covid Evaluation: Asymptomatic - no recent exposure (last 10 days) testing not required  Diagnosis: Acute on chronic respiratory failure with hypoxia [facility]) [[id number]]  Admitting Physician: Synetta Fail [[id number]]  Attending Physician: Zannie Cove [[id number]]  Certification:: I certify this patient will need inpatient services for at least 2 midnights  Estimated Length of Stay: 2          B Medical/Surgery History Past Medical History:  Diagnosis Date   Adjustment disorder with depressed mood 09/07/2007   CAP (community acquired pneumonia) 02/16/2021   Cataract    removed bilaterally   Chronic kidney disease    kidney stones    Cirrhosis (HCC)    Colon polyps    Tubular Adenoma 2010   CONGESTIVE HEART FAILURE 12/09/2008   CORONARY ARTERY DISEASE 2006   HYPERLIPIDEMIA 08/09/2006   HYPERTENSION 08/09/2006   MYOCARDIAL INFARCTION, HX OF 07/07/2004   NEPHROLITHIASIS, HX OF 08/09/2006   OBESITY 07/19/2008   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 Excell Seltzer and Dr. Leafy Ro   Sleep apnea    has a cpap - not using religiously   Past Surgical History:  Procedure Laterality Date   BIOPSY  11/18/2020   Procedure: BIOPSY;  Surgeon: Benancio Deeds, MD;  Location: [facility] ENDOSCOPY;  Service: Gastroenterology;;   CARDIOVERSION N/A 12/14/2017   Procedure: CARDIOVERSION;  Surgeon: Chilton Si, MD;  Location: [facility] ENDOSCOPY;  Service: Cardiovascular;  Laterality: N/A;   CARDIOVERSION N/A 10/01/2019   Procedure: CARDIOVERSION;  Surgeon: Jodelle Red, MD;  Location: [facility] ENDOSCOPY;  Service: Cardiovascular;  Laterality: N/A;   COLONOSCOPY     COLONOSCOPY WITH PROPOFOL N/A 11/18/2020   Procedure: COLONOSCOPY WITH PROPOFOL;  Surgeon: Benancio Deeds, MD;  Location: [facility] ENDOSCOPY;  Service: Gastroenterology;  Laterality: N/A;   CORONARY ARTERY BYPASS GRAFT  2006   ESOPHAGOGASTRODUODENOSCOPY (EGD) WITH PROPOFOL N/A 11/18/2020   Procedure: ESOPHAGOGASTRODUODENOSCOPY (EGD) WITH PROPOFOL;  Surgeon: Benancio Deeds, MD;  Location: [facility] ENDOSCOPY;  Service: Gastroenterology;  Laterality: N/A;   INTRAOPERATIVE TRANSTHORACIC ECHOCARDIOGRAM N/A 03/16/2022  Procedure: INTRAOPERATIVE TRANSTHORACIC ECHOCARDIOGRAM;  Surgeon: Tonny Bollman, MD;  Location: [facility] INVASIVE CV LAB;  Service: Open Heart Surgery;  Laterality: N/A;   LEFT ATRIAL APPENDAGE OCCLUSION N/A 12/17/2021   Procedure: LEFT ATRIAL APPENDAGE OCCLUSION;  Surgeon: Tonny Bollman, MD;  Location: [facility] INVASIVE CV LAB;  Service: Cardiovascular;  Laterality: N/A;   POLYPECTOMY     POLYPECTOMY  11/18/2020   Procedure: POLYPECTOMY;  Surgeon: Benancio Deeds, MD;  Location: [facility] ENDOSCOPY;  Service: Gastroenterology;;   RIGHT/LEFT HEART CATH AND CORONARY/GRAFT ANGIOGRAPHY N/A 02/17/2022   Procedure: RIGHT/LEFT HEART CATH AND CORONARY/GRAFT ANGIOGRAPHY;  Surgeon: Tonny Bollman, MD;  Location: [facility] INVASIVE CV LAB;  Service: Cardiovascular;  Laterality: N/A;   TEE WITHOUT CARDIOVERSION N/A 12/17/2021   Procedure: TRANSESOPHAGEAL ECHOCARDIOGRAM (TEE);  Surgeon: Tonny Bollman, MD;  Location: [facility] INVASIVE CV LAB;  Service: Cardiovascular;  Laterality: N/A;   TRANSCATHETER AORTIC VALVE REPLACEMENT, TRANSFEMORAL Right 03/16/2022   Procedure: Transcatheter Aortic Valve Replacement, Transfemoral;  Surgeon: Tonny Bollman, MD;  Location: [facility] INVASIVE CV LAB;  Service: Open Heart Surgery;  Laterality: Right;     A IV Location/Drains/Wounds Patient Lines/Drains/Airways Status     Active Line/Drains/Airways     Name Placement date Placement time Site Days   Peripheral IV 08/09/22 20 G Right Antecubital 08/09/22  1457  Antecubital  1            Intake/Output Last 24 hours  Intake/Output Summary (Last 24 hours) at 08/10/2022 1145 Last data filed at 08/10/2022 1123 Gross per 24 hour  Intake --  Output 2850 ml  Net -2850 ml    Labs/Imaging Results for orders placed or performed during the hospital encounter of 08/09/22 (from the past 48 hour(s))  CBC with Differential     Status: Abnormal   Collection Time: 08/09/22  3:00 PM  Result Value Ref Range   WBC 5.1 4.0 - 10.5 K/uL   RBC 3.53 (L) 4.22 - 5.81 MIL/uL   Hemoglobin 11.2 (L) 13.0 - 17.0 g/dL   HCT 16.1 (L) 09.6 - 04.5 %   MCV 97.5 80.0 - 100.0 fL   MCH 31.7 26.0 - 34.0 pg   MCHC 32.6 30.0 - 36.0 g/dL   RDW 40.9 (H) 81.1 - 91.4 %   Platelets 57 (L) 150 - 400 K/uL    Comment: Immature Platelet Fraction may  be clinically indicated, consider ordering this additional test [id number] REPEATED TO VERIFY PLATELET COUNT CONFIRMED BY SMEAR    nRBC 0.0 0.0 - 0.2 %   Neutrophils Relative % 71 %   Neutro Abs 3.6 1.7 - 7.7 K/uL   Lymphocytes Relative 17 %   Lymphs Abs 0.9 0.7 - 4.0 K/uL   Monocytes Relative 8 %   Monocytes Absolute 0.4 0.1 - 1.0 K/uL   Eosinophils Relative 3 %   Eosinophils Absolute 0.2 0.0 - 0.5 K/uL   Basophils Relative 1 %   Basophils Absolute 0.0 0.0 - 0.1 K/uL   Immature Granulocytes 0 %   Abs Immature Granulocytes 0.02 0.00 - 0.07 K/uL    Comment: Performed at [facility], [address], [place], [place] [address]  Comprehensive metabolic panel     Status: Abnormal   Collection Time: 08/09/22  3:00 PM  Result Value Ref Range   Sodium 140 135 - 145 mmol/L   Potassium 3.3 (L) 3.5 - 5.1 mmol/L   Chloride 101 98 - 111 mmol/L   CO2 23 22 - 32 mmol/L   Glucose, Bld 134 (H)  70 - 99 mg/dL    Comment: Glucose reference range applies only to samples taken after fasting for at least 8 hours.   BUN 10 8 - 23 mg/dL   Creatinine, Ser 1.61 0.61 - 1.24 mg/dL   Calcium 9.0 8.9 - 09.6 mg/dL   Total Protein 6.4 (L) 6.5 - 8.1 g/dL   Albumin 2.7 (L) 3.5 - 5.0 g/dL   AST 38 15 - 41 U/L   ALT 20 0 - 44 U/L   Alkaline Phosphatase 64 38 - 126 U/L   Total Bilirubin 4.6 (H) 0.3 - 1.2 mg/dL   GFR, Estimated >04 >54 mL/min    Comment: (NOTE) Calculated using the CKD-EPI Creatinine Equation (2021)    Anion gap 16 (H) 5 - 15    Comment: Performed at [facility], [address], [place], [place] [address]  Troponin I (High Sensitivity)     Status: Abnormal   Collection Time: 08/09/22  3:00 PM  Result Value Ref Range   Troponin I (High Sensitivity) 39 (H) <18 ng/L    Comment: (NOTE) Elevated high sensitivity troponin I (hsTnI) values and significant  changes across serial measurements may suggest ACS but many other  chronic and acute conditions are known to elevate hsTnI  results.  Refer to the "Links" section for chest pain algorithms and additional  guidance. Performed at [facility], [address], [place], [place] [address]   Brain natriuretic peptide     Status: Abnormal   Collection Time: 08/09/22  3:00 PM  Result Value Ref Range   B Natriuretic Peptide 1,031.6 (H) 0.0 - 100.0 pg/mL    Comment: Performed at [facility], [address], [place], [place] [address]  SARS Coronavirus 2 by RT PCR (hospital order, performed in [facility] hospital lab) *cepheid single result test* Anterior Nasal Swab     Status: None   Collection Time: 08/09/22  3:05 PM   Specimen: Anterior Nasal Swab  Result Value Ref Range   SARS Coronavirus 2 by RT PCR NEGATIVE NEGATIVE    Comment: Performed at [facility], [address], [place], [place] [address]  Troponin I (High Sensitivity)     Status: Abnormal   Collection Time: 08/09/22  5:18 PM  Result Value Ref Range   Troponin I (High Sensitivity) 38 (H) <18 ng/L    Comment: (NOTE) Elevated high sensitivity troponin I (hsTnI) values and significant  changes across serial measurements may suggest ACS but many other  chronic and acute conditions are known to elevate hsTnI results.  Refer to the "Links" section for chest pain algorithms and additional  guidance. Performed at [facility], [address] [place], [place] [address]   Magnesium     Status: None   Collection Time: 08/09/22  5:18 PM  Result Value Ref Range   Magnesium 2.0 1.7 - 2.4 mg/dL    Comment: Performed at [facility], [address], [place], [place] [address]  Protime-INR     Status: Abnormal   Collection Time: 08/10/22 12:35 AM  Result Value Ref Range   Prothrombin Time 21.3 (H) 11.4 - 15.2 seconds   INR 1.8 (H) 0.8 - 1.2    Comment: (NOTE) INR goal varies based on device and disease states. Performed at [facility], [address] [place], [place] [address]    CT Angio Chest PE W and/or Wo  Contrast  Result Date: 08/09/2022 CLINICAL DATA:  Pulmonary embolism (PE) suspected, high prob Hypoxia and shortness of breath. EXAM: CT ANGIOGRAPHY CHEST WITH CONTRAST TECHNIQUE: Multidetector CT imaging of the chest was performed using the standard protocol during bolus administration of intravenous contrast. Multiplanar CT image reconstructions and MIPs were obtained to evaluate the vascular anatomy. RADIATION DOSE REDUCTION: This exam was performed according to the departmental dose-optimization program which includes automated exposure control, adjustment of the mA and/or kV according to patient size and/or use of iterative reconstruction technique. CONTRAST:  75mL OMNIPAQUE IOHEXOL 350 MG/ML SOLN COMPARISON:  Radiograph earlier today CT 02/24/2022 FINDINGS: Cardiovascular: There are no filling defects within the pulmonary arteries to suggest pulmonary embolus. Assessment is diagnostic to the segmental level. The subsegmental branches are not well assessed. Aortic atherosclerosis. Aortic valve replacement. Left atrial occlusion device in place. Multi chamber cardiomegaly. Coronary artery calcifications. Mediastinum/Nodes: Scattered mediastinal lymph nodes not enlarged by size criteria. No suspicious thoracic adenopathy. Patulous esophagus. Small hiatal hernia. Lungs/Pleura: Small bilateral pleural effusions with associated atelectasis. Small amount of fluid tracks into the right major fissure. Chronic subpleural reticulation and ground-glass. Scattered punctate calcified granuloma. Overall low lung volumes. Upper Abdomen: Hepatic cirrhosis. Splenomegaly only partially included. Gallstones. Musculoskeletal: Prior median sternotomy. Diffuse thoracic spurring. There are no acute or suspicious osseous abnormalities. Mild generalized body wall edema. Review of the MIP images confirms the above findings. IMPRESSION: 1. No pulmonary embolus. 2. Small bilateral pleural effusions with associated atelectasis. 3. Chronic  subpleural reticulation and ground-glass, typical of interstitial lung disease. 4. Multi chamber cardiomegaly. Coronary artery calcifications. 5. Hepatic cirrhosis. Splenomegaly only partially included. 6. Cholelithiasis. Aortic Atherosclerosis (ICD10-I70.0). Electronically Signed   By: Narda Rutherford M.D.   On: 08/09/2022 18:28   DG Chest Portable 1 View  Result Date: 08/09/2022 CLINICAL DATA:  Shortness of breath EXAM: PORTABLE CHEST 1 VIEW COMPARISON:  Previous studies including the examination of 07/16/2022 FINDINGS: Transverse diameter of heart is increased. There are no signs of alveolar pulmonary edema. There is poor inspiration. Patchy densities are seen in left lower lung field. There is improvement in aeration in right lower lung field. There is no significant pleural effusion or pneumothorax. There is previous coronary bypass surgery. Prosthetic cardiac valve is seen. IMPRESSION: Cardiomegaly. There are no signs of alveolar pulmonary edema. Increased markings in the left lower lung field may suggest subsegmental atelectasis or pneumonia. Electronically Signed   By: Ernie Avena M.D.   On: 08/09/2022 15:08    Pending Labs Unresulted Labs (From admission, onward)     Start     Ordered   08/11/22 0500  CBC  Tomorrow morning,   R        08/10/22 1023   08/11/22 0500  Basic metabolic panel  Tomorrow morning,   R        08/10/22 1023   08/09/22 1755  Respiratory (~20 pathogens) panel by PCR  (Respiratory panel by PCR (~20 pathogens, ~24 hr TAT)  w precautions)  Add-on,   AD        08/09/22 1754            Vitals/Pain Today's Vitals   08/10/22 0900 08/10/22 1000 08/10/22 1100 08/10/22 1125  BP: 129/60 (!) 119/42 (!) 120/53   Pulse: 70 63 64   Resp: (!) 25 (!) 21 (!) 26   Temp:    98.6 F (37 C)  TempSrc:    Oral  SpO2: 91% 91% 90%   Weight:      Height:      PainSc:  Isolation Precautions Droplet precaution  Medications Medications  aspirin EC tablet 81 mg  (81 mg Oral Not Given 08/10/22 0850)  carvedilol (COREG) tablet 3.125 mg (3.125 mg Oral Given 08/10/22 0849)  atorvastatin (LIPITOR) tablet 80 mg (80 mg Oral Given 08/10/22 0849)  empagliflozin (JARDIANCE) tablet 10 mg (10 mg Oral Given 08/10/22 1121)  Pirfenidone TABS 801 mg (0 mg Oral Hold 08/10/22 1126)  furosemide (LASIX) injection 80 mg (80 mg Intravenous Given 08/10/22 0852)  sodium chloride flush (NS) 0.9 % injection 3 mL (3 mLs Intravenous Given 08/10/22 1048)  furosemide (LASIX) injection 40 mg (40 mg Intravenous Not Given 08/09/22 1752)  spironolactone (ALDACTONE) tablet 25 mg (25 mg Oral Given 08/10/22 1121)  cefTRIAXone (ROCEPHIN) 1 g in sodium chloride 0.9 % 100 mL IVPB (0 g Intravenous Stopped 08/09/22 1907)  azithromycin (ZITHROMAX) 500 mg in sodium chloride 0.9 % 250 mL IVPB (0 mg Intravenous Stopped 08/09/22 2031)  furosemide (LASIX) injection 40 mg (40 mg Intravenous Given 08/09/22 1716)  potassium chloride SA (KLOR-CON M) CR tablet 40 mEq (40 mEq Oral Given 08/09/22 1819)  iohexol (OMNIPAQUE) 350 MG/ML injection 75 mL (75 mLs Intravenous Contrast Given 08/09/22 1809)    Mobility walks with person assist     Focused Assessments Pulmonary Assessment Handoff:  Lung sounds: Bilateral Breath Sounds: Clear L Breath Sounds: Clear O2 Device: High Flow Nasal Cannula O2 Flow Rate (L/min): 10 L/min    R Recommendations: See Admitting Provider Note  Report given to:   Additional Notes:

## 2022-08-10 NOTE — Progress Notes (Signed)
RN brought the patient' secured home meds to be dispense for hospital use to pharmacy. Oncoming RN made aware.

## 2022-08-10 NOTE — Progress Notes (Signed)
   08/10/22 1858  Patient Belongings  Patient/Family advised about valuables policy? Yes  Home Medications Selected meds being dispensed (Include drug name in comment) (pirfernidone 267mg  tablet)  Patient Belongings Kept at bedside  Belongings at Bedside Clothing

## 2022-08-10 NOTE — Progress Notes (Addendum)
PROGRESS NOTE    [name]  WUJ:[id number] DOB: 09/09/1950 DOA: 08/09/2022 PCP: Philip Aspen, Limmie Patricia, MD  71/M with history of pulmonary fibrosis/ILD, chronic respiratory failure on 5-7 L home O2, diastolic CHF, cirrhosis with portal hypertension, esophageal varices, hypertension, aortic stenosis sp TAVR, history of paroxysmal A-fib sp watchman's device, CABG presented to the ED with worsening dyspnea on exertion. -Reported worsening dyspnea for 3 to 4 days, approximately 15 pound weight gain, mostly compliant with diuretics, not on the days he has to go to work unfortunately. -In the ED tachypneic, hyper hypoxic required nonrebreather mask, labs noted albumin 2.7, hemoglobin 11.2, creatinine 0.9, BNP 1031, troponin 39, CT chest noted small pleural effusions and interstitial lung disease   Subjective: -Feels a little better today  Assessment and Plan:  Acute on chronic respiratory failure with hypoxia Acute on chronic diastolic CHF -Worsening hypoxia, edema, 15 LB weight gain -Last echo 5/24 with EF 50%, mild LVH, normal RV, TAVR -CHF complicated by hypoalbuminemia and third spacing -Continue IV Lasix today, Coreg, Jardiance -Add Aldactone, wean O2 -Importance of compliance with diuretics emphasized, he will be working from home long-term now which should help improve compliance -Monitor I's/O,   ILD Pulmonary fibrosis -On 5 to 7 L home O2, worsening hypoxia with CHF now, followed by Dr. Isaiah Serge for ILD - Continue home pirfenidone   CAD HLD -Status post CABG - Continue home carvedilol, atorvastatin, aspirin   Atrial fibrillation - Status post Watchman procedure - Continue home carvedilol   Aortic stenosis - Status post TAVR   Cirrhosis, portal hypertension Thrombocytopenia -Diuretics as above, add Aldactone   Hypertension - On diuretics as above - Continue home carvedilol   Obesity - Noted   DVT prophylaxis:      SCDs with chronic thrombocytopenia Code  Status:              Discussed CODE STATUS with patient, he is agreeable to DNR Family Communication:    Wife and side Disposition Plan: Home likely 2 to 3 days  Consultants:    Procedures:   Antimicrobials:    Objective: Vitals:   08/10/22 0844 08/10/22 0849 08/10/22 0900 08/10/22 1000  BP:   129/60 (!) 119/42  Pulse:  69 70 63  Resp:   (!) 25 (!) 21  Temp: 98.9 F (37.2 C)     TempSrc: Oral     SpO2:   91% 91%  Weight:      Height:        Intake/Output Summary (Last 24 hours) at 08/10/2022 1016 Last data filed at 08/10/2022 0118 Gross per 24 hour  Intake --  Output 2000 ml  Net -2000 ml   Filed Weights   08/09/22 1344  Weight: 97.5 kg    Examination:  General exam: Obese chronically ill male sitting up in bed, AAOx3, no distress HEENT: Positive JVD CVS: S1-S2, regular rhythm Lungs: Decreased breath sounds at the bases Abdomen: Soft, nontender, bowel sounds present Extremities: 2+ edema, chronic skin changes  Skin: Hyperpigmentation Psychiatry:  Mood & affect appropriate.     Data Reviewed:   CBC: Recent Labs  Lab 08/09/22 1500  WBC 5.1  NEUTROABS 3.6  HGB 11.2*  HCT 34.4*  MCV 97.5  PLT 57*   Basic Metabolic Panel: Recent Labs  Lab 08/09/22 1500 08/09/22 1718  NA 140  --   K 3.3*  --   CL 101  --   CO2 23  --   GLUCOSE 134*  --  BUN 10  --   CREATININE 0.95  --   CALCIUM 9.0  --   MG  --  2.0   GFR: Estimated Creatinine Clearance: 76.8 mL/min (by C-G formula based on SCr of 0.95 mg/dL). Liver Function Tests: Recent Labs  Lab 08/09/22 1500  AST 38  ALT 20  ALKPHOS 64  BILITOT 4.6*  PROT 6.4*  ALBUMIN 2.7*   No results for input(s): "LIPASE", "AMYLASE" in the last 168 hours. No results for input(s): "AMMONIA" in the last 168 hours. Coagulation Profile: Recent Labs  Lab 08/10/22 0035  INR 1.8*   Cardiac Enzymes: No results for input(s): "CKTOTAL", "CKMB", "CKMBINDEX", "TROPONINI" in the last 168 hours. BNP (last 3  results) No results for input(s): "PROBNP" in the last 8760 hours. HbA1C: No results for input(s): "HGBA1C" in the last 72 hours. CBG: No results for input(s): "GLUCAP" in the last 168 hours. Lipid Profile: No results for input(s): "CHOL", "HDL", "LDLCALC", "TRIG", "CHOLHDL", "LDLDIRECT" in the last 72 hours. Thyroid Function Tests: No results for input(s): "TSH", "T4TOTAL", "FREET4", "T3FREE", "THYROIDAB" in the last 72 hours. Anemia Panel: No results for input(s): "VITAMINB12", "FOLATE", "FERRITIN", "TIBC", "IRON", "RETICCTPCT" in the last 72 hours. Urine analysis:    Component Value Date/Time   COLORURINE YELLOW 07/16/2022 0019   APPEARANCEUR HAZY (A) 07/16/2022 0019   LABSPEC 1.011 07/16/2022 0019   PHURINE 5.0 07/16/2022 0019   GLUCOSEU NEGATIVE 07/16/2022 0019   HGBUR SMALL (A) 07/16/2022 0019   HGBUR negative 07/11/2009 0849   BILIRUBINUR NEGATIVE 07/16/2022 0019   BILIRUBINUR n 08/01/2015 0926   KETONESUR NEGATIVE 07/16/2022 0019   PROTEINUR 30 (A) 07/16/2022 0019   UROBILINOGEN 0.2 08/01/2015 0926   UROBILINOGEN 1.0 07/11/2009 0849   NITRITE NEGATIVE 07/16/2022 0019   LEUKOCYTESUR NEGATIVE 07/16/2022 0019   Sepsis Labs: @LABRCNTIP (procalcitonin:4,lacticidven:4)  ) Recent Results (from the past 240 hour(s))  SARS Coronavirus 2 by RT PCR (hospital order, performed in [facility] hospital lab) *cepheid single result test* Anterior Nasal Swab     Status: None   Collection Time: 08/09/22  3:05 PM   Specimen: Anterior Nasal Swab  Result Value Ref Range Status   SARS Coronavirus 2 by RT PCR NEGATIVE NEGATIVE Final    Comment: Performed at [facility], [address] [place], [place] [address]     Radiology Studies: CT Angio Chest PE W and/or Wo Contrast  Result Date: 08/09/2022 CLINICAL DATA:  Pulmonary embolism (PE) suspected, high prob Hypoxia and shortness of breath. EXAM: CT ANGIOGRAPHY CHEST WITH CONTRAST TECHNIQUE: Multidetector CT imaging of the chest  was performed using the standard protocol during bolus administration of intravenous contrast. Multiplanar CT image reconstructions and MIPs were obtained to evaluate the vascular anatomy. RADIATION DOSE REDUCTION: This exam was performed according to the departmental dose-optimization program which includes automated exposure control, adjustment of the mA and/or kV according to patient size and/or use of iterative reconstruction technique. CONTRAST:  75mL OMNIPAQUE IOHEXOL 350 MG/ML SOLN COMPARISON:  Radiograph earlier today CT 02/24/2022 FINDINGS: Cardiovascular: There are no filling defects within the pulmonary arteries to suggest pulmonary embolus. Assessment is diagnostic to the segmental level. The subsegmental branches are not well assessed. Aortic atherosclerosis. Aortic valve replacement. Left atrial occlusion device in place. Multi chamber cardiomegaly. Coronary artery calcifications. Mediastinum/Nodes: Scattered mediastinal lymph nodes not enlarged by size criteria. No suspicious thoracic adenopathy. Patulous esophagus. Small hiatal hernia. Lungs/Pleura: Small bilateral pleural effusions with associated atelectasis. Small amount of fluid tracks into the right major fissure.  Chronic subpleural reticulation and ground-glass. Scattered punctate calcified granuloma. Overall low lung volumes. Upper Abdomen: Hepatic cirrhosis. Splenomegaly only partially included. Gallstones. Musculoskeletal: Prior median sternotomy. Diffuse thoracic spurring. There are no acute or suspicious osseous abnormalities. Mild generalized body wall edema. Review of the MIP images confirms the above findings. IMPRESSION: 1. No pulmonary embolus. 2. Small bilateral pleural effusions with associated atelectasis. 3. Chronic subpleural reticulation and ground-glass, typical of interstitial lung disease. 4. Multi chamber cardiomegaly. Coronary artery calcifications. 5. Hepatic cirrhosis. Splenomegaly only partially included. 6.  Cholelithiasis. Aortic Atherosclerosis (ICD10-I70.0). Electronically Signed   By: Narda Rutherford M.D.   On: 08/09/2022 18:28   DG Chest Portable 1 View  Result Date: 08/09/2022 CLINICAL DATA:  Shortness of breath EXAM: PORTABLE CHEST 1 VIEW COMPARISON:  Previous studies including the examination of 07/16/2022 FINDINGS: Transverse diameter of heart is increased. There are no signs of alveolar pulmonary edema. There is poor inspiration. Patchy densities are seen in left lower lung field. There is improvement in aeration in right lower lung field. There is no significant pleural effusion or pneumothorax. There is previous coronary bypass surgery. Prosthetic cardiac valve is seen. IMPRESSION: Cardiomegaly. There are no signs of alveolar pulmonary edema. Increased markings in the left lower lung field may suggest subsegmental atelectasis or pneumonia. Electronically Signed   By: Ernie Avena M.D.   On: 08/09/2022 15:08     Scheduled Meds:  aspirin EC  81 mg Oral Daily   atorvastatin  80 mg Oral Daily   carvedilol  3.125 mg Oral BID WC   empagliflozin  10 mg Oral Daily   furosemide  40 mg Intravenous Once   furosemide  80 mg Intravenous BID   Pirfenidone  801 mg Oral TID WC   sodium chloride flush  3 mL Intravenous Q12H   Continuous Infusions:   LOS: 0 days    Time spent:    Zannie Cove, MD [facility]   08/10/2022, 10:16 AM

## 2022-08-10 NOTE — Plan of Care (Signed)
  Problem: Education: Goal: Ability to demonstrate management of disease process will improve Outcome: Progressing Goal: Ability to verbalize understanding of medication therapies will improve Outcome: Progressing   Problem: Activity: Goal: Capacity to carry out activities will improve Outcome: Progressing   

## 2022-08-10 NOTE — Progress Notes (Signed)
   Heart Failure Stewardship Pharmacist Progress Note   PCP: Philip Aspen, Limmie Patricia, MD PCP-Cardiologist: Olga Millers, MD    HPI:  72 yo M with PMH of HTN, HLD, CHF, CAD s/p CABG, aortic stenosis s/p TAVR, afib s/p Watchman, pulmonary fibrosis, ILD, chronic respiratory failure on 5L O2 at baseline, and cirrhosis.   Presented to the ED on 7/8 with shortness of breath, weight gain, and LE edema. Was hypoxic in the 80s on 6L O2 and was placed on nonrebreather at 15L. BNP elevated to 1031. CXR showed cardiomegaly without overt edema and atelectasis vs PNA. CTA negative for PE. Last ECHO from 06/2022 showed LVEF 50-55%, regional wall motion abnormalities, mild LVH, RV normal. Amyloid screen was negative 05/2022.  Current HF Medications: Diuretic: furosemide 80 mg IV BID Beta Blocker: carvedilol 3.125 mg BID MRA: spironolactone 25 mg daily SGLT2i: Jardiance 10 mg daily  Prior to admission HF Medications: Diuretic: furosemide 80 mg daily AND torsemide 60 mg daily Beta blocker: carvedilol 3.125 mg BID SGLT2i: Jardiance 10 mg daily  Pertinent Lab Values: Serum creatinine 0.95, BUN 10, Potassium 3.3, Sodium 140, BNP 1031.6, Magnesium 2.0  Vital Signs: Weight: 212 lbs (admission weight: 212 lbs) Blood pressure: 120-140/50s  Heart rate: 60s  I/O: net -3.1L since admission  Medication Assistance / Insurance Benefits Check: Does the patient have prescription insurance?  Yes Type of insurance plan: [facility] Medicare  Outpatient Pharmacy:  Prior to admission outpatient [facility] Is the patient willing to use [facility] TOC pharmacy at discharge? Yes Is the patient willing to transition their outpatient pharmacy to utilize a [facility] outpatient pharmacy?   Pending    Assessment: 1. Acute on chronic diastolic CHF (LVEF 50-55%). NYHA class III symptoms. - Continue furosemide 80 mg IV BID. Patient was transitioned from furosemide to torsemide as outpatient on 04/23/22. Unfortunately, he has  been getting both medications refilled at the pharmacy. Will need to discontinue the furosemide prescription at discharge. - Continue carvedilol 3.125 mg BID - Agree with adding spironolactone 25 mg daily - Continue Jardiance 10 mg daily   Plan: 1) Medication changes recommended at this time: - Agree with changes - Stop PO furosemide on discharge  2) Patient assistance: - None pending  3)  Education  - To be completed prior to discharge  Sharen Hones, PharmD, BCPS Heart Failure Stewardship Pharmacist Phone [phone]

## 2022-08-10 NOTE — Progress Notes (Signed)
   08/10/22 1858  Patient Belongings  Patient/Family advised about valuables policy? Yes  Home Medications Selected meds being dispensed (Include drug name in comment) (pirfernidone 267mg  tablet)  Patient Belongings Kept at bedside  Belongings at Bedside Clothing;Electronic device(s)  Bedside: Electronic Device(s) Cellphone

## 2022-08-11 ENCOUNTER — Encounter (HOSPITAL_COMMUNITY): Payer: Self-pay | Admitting: Internal Medicine

## 2022-08-11 DIAGNOSIS — I10 Essential (primary) hypertension: Secondary | ICD-10-CM | POA: Diagnosis not present

## 2022-08-11 DIAGNOSIS — J849 Interstitial pulmonary disease, unspecified: Secondary | ICD-10-CM | POA: Diagnosis not present

## 2022-08-11 DIAGNOSIS — I5033 Acute on chronic diastolic (congestive) heart failure: Secondary | ICD-10-CM | POA: Diagnosis not present

## 2022-08-11 DIAGNOSIS — I2510 Atherosclerotic heart disease of native coronary artery without angina pectoris: Secondary | ICD-10-CM | POA: Diagnosis not present

## 2022-08-11 DIAGNOSIS — E876 Hypokalemia: Secondary | ICD-10-CM

## 2022-08-11 LAB — RESPIRATORY PANEL BY PCR

## 2022-08-11 LAB — CBC
HCT: 30.2 % — ABNORMAL LOW (ref 39.0–52.0)
Hemoglobin: 10.2 g/dL — ABNORMAL LOW (ref 13.0–17.0)
MCH: 33.2 pg (ref 26.0–34.0)
MCHC: 33.8 g/dL (ref 30.0–36.0)
MCV: 98.4 fL (ref 80.0–100.0)
Platelets: 56 10*3/uL — ABNORMAL LOW (ref 150–400)
RBC: 3.07 MIL/uL — ABNORMAL LOW (ref 4.22–5.81)
RDW: 17.3 % — ABNORMAL HIGH (ref 11.5–15.5)
WBC: 5.4 10*3/uL (ref 4.0–10.5)
nRBC: 0 % (ref 0.0–0.2)

## 2022-08-11 LAB — BASIC METABOLIC PANEL
Anion gap: 7 (ref 5–15)
BUN: 9 mg/dL (ref 8–23)
CO2: 22 mmol/L (ref 22–32)
Calcium: 8.2 mg/dL — ABNORMAL LOW (ref 8.9–10.3)
Chloride: 108 mmol/L (ref 98–111)
Creatinine, Ser: 1.01 mg/dL (ref 0.61–1.24)
GFR, Estimated: >60 mL/min (ref >60)
Glucose, Bld: 106 mg/dL — ABNORMAL HIGH (ref 70–99)
Potassium: 3.2 mmol/L — ABNORMAL LOW (ref 3.5–5.1)
Sodium: 137 mmol/L (ref 135–145)

## 2022-08-11 MED ORDER — PANTOPRAZOLE SODIUM 40 MG PO TBEC
40.0000 mg | DELAYED_RELEASE_TABLET | Freq: Every day | ORAL | Status: DC
  Administered 2022-08-11 – 2022-08-19 (×9): 40 mg via ORAL
  Filled 2022-08-11 (×9): qty 1

## 2022-08-11 MED ORDER — POTASSIUM CHLORIDE CRYS ER 20 MEQ PO TBCR
40.0000 meq | EXTENDED_RELEASE_TABLET | ORAL | Status: AC
  Administered 2022-08-11 (×2): 40 meq via ORAL
  Filled 2022-08-11 (×2): qty 2

## 2022-08-11 MED ORDER — FUROSEMIDE 10 MG/ML IJ SOLN
80.0000 mg | Freq: Once | INTRAMUSCULAR | Status: AC
  Administered 2022-08-11: 80 mg via INTRAVENOUS
  Filled 2022-08-11: qty 8

## 2022-08-11 MED ORDER — MELATONIN 3 MG PO TABS
3.0000 mg | ORAL_TABLET | Freq: Every evening | ORAL | Status: DC | PRN
  Administered 2022-08-11 – 2022-08-18 (×7): 3 mg via ORAL
  Filled 2022-08-11 (×7): qty 1

## 2022-08-11 MED ORDER — TAMSULOSIN HCL 0.4 MG PO CAPS
0.4000 mg | ORAL_CAPSULE | Freq: Every day | ORAL | Status: DC
  Administered 2022-08-11 – 2022-08-19 (×9): 0.4 mg via ORAL
  Filled 2022-08-11 (×9): qty 1

## 2022-08-11 NOTE — Progress Notes (Signed)
Heart Failure Nurse Navigator Progress Note  PCP: Philip Aspen, Limmie Patricia, MD PCP-Cardiologist: Jens Som Admission Diagnosis: Community acquired pneumonia, Acute respiratory failure with hypoxia.  Admitted from: Home via EMS  Presentation:   [name] presented with 3 days of shortness of breath, BLE edema,weight gain +15,  placed on [place]. Normally wears 5 L of oxygen at home, BNP 1,031.6, Troponin 39, IV lasix given, CXR showed cardiomegaly without edema and atelectasis vs pneumonia. CT negative for PE,   Patient and significant other were educated on the sign and symptoms of heart failure, daily weights, when to call his doctor or go to the ED, Diet/ fluid restrictions, taking all medications as prescribed and attending all medical appointments, Patient verbalized his understanding of education, a HF TOC appointment was scheduled for 08/23/2022 @ 2 pm.   ECHO/ LVEF: 50-55%  Clinical Course:  Past Medical History:  Diagnosis Date   Adjustment disorder with depressed mood 09/07/2007   CAP (community acquired pneumonia) 02/16/2021   Cataract    removed bilaterally   Chronic kidney disease    kidney stones   Cirrhosis (HCC)    Colon polyps    Tubular Adenoma 2010   CONGESTIVE HEART FAILURE 12/09/2008   CORONARY ARTERY DISEASE 2006   HYPERLIPIDEMIA 08/09/2006   HYPERTENSION 08/09/2006   MYOCARDIAL INFARCTION, HX OF 07/07/2004   NEPHROLITHIASIS, HX OF 08/09/2006   OBESITY 07/19/2008   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 Excell Seltzer   Pulmonary fibrosis (HCC)    S/P TAVR (transcatheter aortic valve replacement) 03/16/2022   26mm S3UR via TF approach with Dr. Excell Seltzer and Dr. Leafy Ro   Sleep apnea    has a cpap - not using religiously     Social History   Socioeconomic History   Marital status: Significant Other    Spouse name: Not on file   Number of children: 0   Years of education: Not on file   Highest education  level: Not on file  Occupational History   Occupation: Barrister's clerk  Tobacco Use   Smoking status: Never    Passive exposure: Never   Smokeless tobacco: Never  Vaping Use   Vaping Use: Never used  Substance and Sexual Activity   Alcohol use: Not Currently    Comment: occasional   Drug use: No   Sexual activity: Not on file  Other Topics Concern   Not on file  Social History Narrative   Not on file   Social Determinants of Health   Financial Resource Strain: Not on file  Food Insecurity: No Food Insecurity (12/17/2021)   Hunger Vital Sign    Worried About Running Out of Food in the Last Year: Never true    Ran Out of Food in the Last Year: Never true  Transportation Needs: No Transportation Needs (12/17/2021)   PRAPARE - Administrator, Civil Service (Medical): No    Lack of Transportation (Non-Medical): No  Physical Activity: Not on file  Stress: Not on file  Social Connections: Not on file   Education Assessment and Provision:  Detailed education and instructions provided on heart failure disease management including the following:  Signs and symptoms of Heart Failure When to call the physician Importance of daily weights Low sodium diet Fluid restriction Medication management Anticipated future follow-up appointments  Patient education given on each of the above topics.  Patient acknowledges understanding via teach back method and acceptance of all instructions.  Education Materials:  "Living Better With Heart Failure" Booklet, HF zone tool, & Daily Weight Tracker Tool.  Patient has scale at home: Yes Patient has pill box at home: NA    High Risk Criteria for Readmission and/or Poor Patient Outcomes: Heart failure hospital admissions (last 6 months): 0  No Show rate: 2 %  Difficult social situation: No Demonstrates medication adherence: No, doesn't take his diuretic when he has to go to work.  Primary Language:  English Literacy level: Reading, writing, and comprehension.   Barriers of Care:   Full medication compliance, doesn't like to take his diuretics on days he works.  Diet/ fluid restrictions/ daily weights  Considerations/Referrals:   Referral made to Heart Failure Pharmacist Stewardship: Yes Referral made to Heart Failure CSW/NCM TOC: No Referral made to Heart & Vascular TOC clinic: Yes, 08/23/2022 @ 2 pm per Dr. Jomarie Longs  Items for Follow-up on DC/TOC: Medication compliance Diet/ fluid restrictions/ daily weights Continued HF education.    Rhae Hammock, BSN, Scientist, clinical (histocompatibility and immunogenetics) Only

## 2022-08-11 NOTE — Assessment & Plan Note (Addendum)
With concerns for mild acute exacerbation. Plan to continue diuresis and wean off oxygen supplementation as tolerated -Since initiation of steroids patient oxygen level down to 8 L high flow nasal cannula supplementation (requiring 25-30 L up to 08/16/2022). -CT chest with no pulmonary embolism, chronic subpleural reticulation and ground glass opacities.  -Continue with pirfenidone.

## 2022-08-11 NOTE — Evaluation (Addendum)
Occupational Therapy Evaluation Patient Details Name: [name] MRN: [id number] DOB: [birth year] Today's Date: 08/11/2022   History of Present Illness Pt is a 72 year old man admitted on 7/8 with shortness of breath and LE swelling. PMH: pulmonary fibrosis on 5L O2, HTN, HLD, CHF, CAD s/p CABG, aortic stenosis s/p TAVR, afib s/p Watchman, Cirrhosis.   Clinical Impression   Pt walks with a cane, drives and works. He lives with his supportive girlfriend of 35 years. Session limited to bed level at RNs request due to high O2 needs (15 L 89% HFNC). Pt demonstrated modified independence in bed level mobility for repositioning without drop in SpO2. He can self feed and groom at bed level and participates in UB ADLs. Recommending HHOT upon discharge and tub equipment for energy conservation. Will follow acutely.      Recommendations for follow up therapy are one component of a multi-disciplinary discharge planning process, led by the attending physician.  Recommendations may be updated based on patient status, additional functional criteria and insurance authorization.   Assistance Recommended at Discharge Frequent or constant Supervision/Assistance  Patient can return home with the following A little help with walking and/or transfers;A little help with bathing/dressing/bathroom;Assistance with cooking/housework;Assist for transportation;Help with stairs or ramp for entrance    Functional Status Assessment  Patient has had a recent decline in their functional status and demonstrates the ability to make significant improvements in function in a reasonable and predictable amount of time.  Equipment Recommendations  Tub/shower seat    Recommendations for Other Services       Precautions / Restrictions Precautions Precautions: Fall Precaution Comments: on HHFNC Restrictions Weight Bearing Restrictions: No      Mobility Bed Mobility Overal bed mobility: Modified Independent              General bed mobility comments: rolled with rail and pulled himself up in trendelenberg with headboard    Transfers                   General transfer comment: RN requesting limiting to bed level eval      Balance                                           ADL either performed or assessed with clinical judgement   ADL Overall ADL's : Needs assistance/impaired Eating/Feeding: Independent;Bed level   Grooming: Set up;Bed level   Upper Body Bathing: Moderate assistance;Bed level   Lower Body Bathing: Total assistance;Bed level   Upper Body Dressing : Set up;Bed level   Lower Body Dressing: Total assistance;Bed level                       Vision Baseline Vision/History: 1 Wears glasses Ability to See in Adequate Light: 0 Adequate Patient Visual Report: No change from baseline       Perception     Praxis      Pertinent Vitals/Pain Pain Assessment Pain Assessment: Faces Faces Pain Scale: Hurts a little bit Pain Location: neck Pain Descriptors / Indicators: Discomfort Pain Intervention(s): Repositioned     Hand Dominance Right   Extremity/Trunk Assessment Upper Extremity Assessment Upper Extremity Assessment: Overall WFL for tasks assessed   Lower Extremity Assessment Lower Extremity Assessment: Defer to PT evaluation       Communication Communication Communication: No difficulties   Cognition Arousal/Alertness:  Awake/alert Behavior During Therapy: WFL for tasks assessed/performed Overall Cognitive Status: Within Functional Limits for tasks assessed                                       General Comments       Exercises     Shoulder Instructions      Home Living Family/patient expects to be discharged to:: Private residence Living Arrangements: Spouse/significant other (girlfriend) Available Help at Discharge: Family;Available 24 hours/day Type of Home: House Home Access: Stairs to  enter [employer] of Steps: 1   Home Layout: One level     Bathroom Shower/Tub: Chief Strategy Officer: Standard     Home Equipment: Agricultural consultant (2 wheels);Cane - single point;Other (comment) (O2)          Prior Functioning/Environment Prior Level of Function : Independent/Modified Independent;Working/employed;Driving             Mobility Comments: walks with a cane ADLs Comments: reads blueprints, plans to retire at the end of the year, independent in self care, stands to shower, girlfriend manages home        OT Problem List: Decreased activity tolerance      OT Treatment/Interventions: Self-care/ADL training;DME and/or AE instruction;Therapeutic activities;Patient/family education;Balance training;Energy conservation    OT Goals(Current goals can be found in the care plan section) Acute Rehab OT Goals OT Goal Formulation: With patient Time For Goal Achievement: 08/25/22 Potential to Achieve Goals: Good ADL Goals Pt Will Perform Grooming: with supervision;standing Pt Will Perform Lower Body Bathing: with supervision;sit to/from stand Pt Will Perform Lower Body Dressing: with supervision;sit to/from stand Pt Will Transfer to Toilet: with supervision;ambulating;regular height toilet Pt Will Perform Toileting - Clothing Manipulation and hygiene: with supervision;sit to/from stand Additional ADL Goal #1: Pt will generalize energy conservation strategies and pursed lip breathing in ADLs and mobility.  OT Frequency: Min 1X/week    Co-evaluation              AM-PAC OT "6 Clicks" Daily Activity     Outcome Measure Help from another person eating meals?: None Help from another person taking care of personal grooming?: A Little Help from another person toileting, which includes using toliet, bedpan, or urinal?: A Little Help from another person bathing (including washing, rinsing, drying)?: A Lot Help from another person to put on and  taking off regular upper body clothing?: A Little Help from another person to put on and taking off regular lower body clothing?: A Lot 6 Click Score: 17   End of Session Equipment Utilized During Treatment: Oxygen Nurse Communication: Other (comment) (wants Ensure)  Activity Tolerance: Treatment limited secondary to medical complications (Comment) Patient left: in chair;with call bell/phone within reach;with family/visitor present  OT Visit Diagnosis: Other (comment) (decreased activity tolerance)                Time: 1610-9604 OT Time Calculation (min): 30 min Charges:  OT General Charges $OT Visit: 1 Visit OT Evaluation $OT Eval Moderate Complexity: 1 Mod OT Treatments $Self Care/Home Management : 8-22 mins  Berna Spare, OTR/L Acute Rehabilitation Services Office: [phone]   [name] 08/11/2022, 10:43 AM

## 2022-08-11 NOTE — Evaluation (Signed)
Physical Therapy Evaluation Patient Details Name: MORRY VEIGA MRN: [id number] DOB: [birth year] Today's Date: 08/11/2022  History of Present Illness  Pt is a 72 year old man admitted on 7/8 with shortness of breath and LE swelling. PMH: pulmonary fibrosis on 5L O2, HTN, HLD, CHF, CAD s/p CABG, aortic stenosis s/p TAVR, afib s/p Watchman, Cirrhosis.  Clinical Impression  Pt admitted with above diagnosis. Pt was able stand at EOB with min guard assist with 1 UE support. Pt desaturates easily with little exertion and is on high level of heated HFNC.  Will progress pt as able.  If resp status is able to be controlled, anticipate he can d/c home with significant other and HHPT. Will follow acutely.  Pt currently with functional limitations due to the deficits listed below (see PT Problem List). Pt will benefit from acute skilled PT to increase their independence and safety with mobility to allow discharge.           Assistance Recommended at Discharge Intermittent Supervision/Assistance  If plan is discharge home, recommend the following:  Can travel by private vehicle  A little help with walking and/or transfers;A little help with bathing/dressing/bathroom;Assistance with cooking/housework;Assist for transportation;Help with stairs or ramp for entrance        Equipment Recommendations None recommended by PT  Recommendations for Other Services       Functional Status Assessment Patient has had a recent decline in their functional status and demonstrates the ability to make significant improvements in function in a reasonable and predictable amount of time.     Precautions / Restrictions Precautions Precautions: Fall Precaution Comments: on HHFNC Restrictions Weight Bearing Restrictions: No      Mobility  Bed Mobility Overal bed mobility: Modified Independent             General bed mobility comments: rolled with rail and pulled himself up in trendelenberg with headboard     Transfers Overall transfer level: Needs assistance Equipment used: 2 person hand held assist Transfers: Sit to/from Stand Sit to Stand: Min guard           General transfer comment: Pt stood to his feet holding onto PT once in standing and took a few side steps to [facility].  Pt said it felt good to get off his back for a few min.  Pt sats while sitting 90% and >.  Desaturated once laid pt in supine to 85% prior to getting pts head elevated.    Ambulation/Gait                  Stairs            Wheelchair Mobility     Tilt Bed    Modified Rankin (Stroke Patients Only)       Balance                                             Pertinent Vitals/Pain Pain Assessment Pain Assessment: Faces Faces Pain Scale: Hurts a little bit Pain Location: neck Pain Descriptors / Indicators: Discomfort Pain Intervention(s): Limited activity within patient's tolerance, Monitored during session, Repositioned    Home Living Family/patient expects to be discharged to:: Private residence Living Arrangements: Spouse/significant other (girlfriend) Available Help at Discharge: Family;Available 24 hours/day Type of Home: House Home Access: Stairs to enter   [employer] of Steps: 1   Home Layout:  One level Home Equipment: Agricultural consultant (2 wheels);Cane - single point;Other (comment) (O2)      Prior Function Prior Level of Function : Independent/Modified Independent;Working/employed;Driving             Mobility Comments: walks with a cane ADLs Comments: reads blueprints, plans to retire at the end of the year, independent in self care, stands to shower, girlfriend manages home     Hand Dominance   Dominant Hand: Right    Extremity/Trunk Assessment   Upper Extremity Assessment Upper Extremity Assessment: Defer to OT evaluation    Lower Extremity Assessment Lower Extremity Assessment: Generalized weakness    Cervical / Trunk  Assessment Cervical / Trunk Assessment: Kyphotic  Communication   Communication: No difficulties  Cognition Arousal/Alertness: Awake/alert Behavior During Therapy: WFL for tasks assessed/performed Overall Cognitive Status: Within Functional Limits for tasks assessed                                          General Comments General comments (skin integrity, edema, etc.): HHFNC 89% FiO2, 50 L/min with sats 85% once laid back down after working with PT but back above 90% once relaxed.  93% O2 on departure.  Other VSS.    Exercises General Exercises - Lower Extremity Ankle Circles/Pumps: AROM, Both, 5 reps, Supine Heel Slides: AROM, Both, 5 reps, Supine   Assessment/Plan    PT Assessment Patient needs continued PT services  PT Problem List Decreased activity tolerance;Decreased balance;Decreased mobility;Decreased knowledge of use of DME;Decreased safety awareness;Decreased knowledge of precautions;Cardiopulmonary status limiting activity       PT Treatment Interventions DME instruction;Gait training;Stair training;Functional mobility training;Therapeutic activities;Therapeutic exercise;Balance training;Neuromuscular re-education;Patient/family education    PT Goals (Current goals can be found in the Care Plan section)  Acute Rehab PT Goals Patient Stated Goal: to go home PT Goal Formulation: With patient Time For Goal Achievement: 08/25/22 Potential to Achieve Goals: Good    Frequency Min 1X/week     Co-evaluation               AM-PAC PT "6 Clicks" Mobility  Outcome Measure Help needed turning from your back to your side while in a flat bed without using bedrails?: None Help needed moving from lying on your back to sitting on the side of a flat bed without using bedrails?: A Little Help needed moving to and from a bed to a chair (including a wheelchair)?: A Little Help needed standing up from a chair using your arms (e.g., wheelchair or bedside  chair)?: A Little Help needed to walk in hospital room?: A Lot Help needed climbing 3-5 steps with a railing? : A Lot 6 Click Score: 17    End of Session Equipment Utilized During Treatment: Gait belt;Oxygen Activity Tolerance: Patient limited by fatigue Patient left: in bed;with call bell/phone within reach;with bed alarm set;with family/visitor present (Significant other returned to room as PT was leaving) Nurse Communication: Mobility status PT Visit Diagnosis: Muscle weakness (generalized) (M62.81);Unsteadiness on feet (R26.81)    Time: 1610-9604 PT Time Calculation (min) (ACUTE ONLY): 20 min   Charges:   PT Evaluation $PT Eval Moderate Complexity: 1 Mod   PT General Charges $$ ACUTE PT VISIT: 1 Visit         Zamari Bonsall M,PT Acute Rehab Services [phone]   [name] 08/11/2022, 1:40 PM

## 2022-08-11 NOTE — Progress Notes (Signed)
  Progress Note   Patient: [name] ZOX:[id number] DOB: [birth year] DOA: 08/09/2022     1 DOS: the patient was seen and examined on 08/11/2022   Brief hospital course: No notes on file  Assessment and Plan: * Acute on chronic diastolic CHF (congestive heart failure) (HCC) Echocardiogram with preserved LV systolic function with EF 50 to 55%, mild LVH of the basal segment. Hypokinesis of the apical segment. RV systolic function preserved. LA with moderate dilatation. SP TAVR  Urine output is 2,850 ml Systolic blood pressure 110 to 120 mmHg.   Plan to continue diuresis with IV furosemide. Continue spironolactone and SGLT 2 inh to augment diuresis.  Continue with carvedilol.  Acute on chronic hypoxemic respiratory failure.  Patient continue with high 02 requirements, today with 15 L/min per HFNC, with 02 saturation of  92%.  ILD (interstitial lung disease) (HCC) No clinical signs of acute exacerbation. Plan to continue diuresis and supplemental 02 per [place].    Essential hypertension Continue blood pressure control with carvedilol.   Coronary atherosclerosis No chest pain or sings of acute coronary syndrome.  Dyslipidemia, continue with statin therapy.  PAF (paroxysmal atrial fibrillation) (HCC) Patient not on anticoagulation, continue rate control with carvedilol.   Hepatic cirrhosis (HCC) No signs of decompensation, Positive esophageal varices.   Gastritis and gastroduodenitis Continue with pantoprazole.   Hypokalemia Renal function with serum cr at 1,0 with K at 3,2 and serum bicarbonate at 22. Na 137,  Plan to continue K correction with Kcl, 40 meq x2 and follow up renal function in am.  Avoid hypotension and nephrotoxic medications.         Subjective: Patient with persistent dyspnea, edema has improved but not back to baseline.   Physical Exam: Vitals:   08/11/22 0854 08/11/22 1017 08/11/22 1230 08/11/22 1541  BP:  (!) 110/46  (!) 123/55  Pulse: 63 64  62 61  Resp:  20 20 20   Temp:  98.5 F (36.9 C)  98.4 F (36.9 C)  TempSrc:  Oral  Oral  SpO2:  94% 92% 92%  Weight:      Height:       Neurology awake and alert ENT with mild pallor Cardiovascular with S1 and S2 present and rhythmic with no gallops, rubs or murmurs Respiratory with positive rales bilaterally and poor inspiratory effort Abdomen with no distention  Positive lower extremity edema +++  Data Reviewed:    Family Communication: I spoke with patient's family at the bedside, we talked in detail about patient's condition, plan of care and prognosis and all questions were addressed.   Disposition: Status is: Inpatient Remains inpatient appropriate because: respiratory failure   Planned Discharge Destination: Home     Author: Coralie Keens, MD 08/11/2022 5:07 PM  For on call review [email or web address].

## 2022-08-11 NOTE — Progress Notes (Signed)
TRH night cross cover note:   I was notified by RN that the patient is feeling more short of breath.  He is continuing to maintain oxygen saturations in the low to mid 90s on 15 L salter high flow nasal cannula, upon which she has been throughout the day.  Other vital signs appear stable, including afebrile, heart rates in the low 60s.  He is here with acute on chronic diastolic heart failure, and RN conveys that the patient's afternoon dose of 80 mg IV Lasix was held for soft blood pressure at that time.  Blood pressure is now improved, noting most recent systolic blood pressure to be 782 mmHg.  Respiratory therapy has evaluated the patient and the patient has refused recommendations for BiPAP as well as regular high flow . No additional new symptoms at this time, including no chest pain.   I ordered Lasix 80 mg iv x 1 to be given now, and will check EKG. If no ensuing improvement in sob with this iv diuresis, will consider pursuing updated chest x-ray at that time.     Newton Pigg, DO Hospitalist

## 2022-08-11 NOTE — Plan of Care (Signed)
?  Problem: Education: ?Goal: Ability to demonstrate management of disease process will improve ?Outcome: Progressing ?Goal: Individualized Educational Video(s) ?Outcome: Progressing ?  ?

## 2022-08-11 NOTE — Progress Notes (Signed)
   08/11/22 1805  Vitals  BP (!) 112/43  MAP (mmHg) (!) 63  BP Location Left Arm  BP Method Automatic  Patient Position (if appropriate) Lying  Pulse Rate 61  Pulse Rate Source Monitor  ECG Heart Rate (!) 59  Resp 20  MEWS COLOR  MEWS Score Color Green  Oxygen Therapy  SpO2 93 %  O2 Device HFNC  Pain Assessment  Pain Scale 0-10  Pain Score 0  MEWS Score  MEWS Temp 0  MEWS Systolic 0  MEWS Pulse 0  MEWS RR 0  MEWS LOC 0  MEWS Score 0    RN obtained VS twice, diastolic lower than baseline and MAP < 65. MD made aware. Verbal order to hold IV lasix and to give PO metoprolol.

## 2022-08-11 NOTE — Assessment & Plan Note (Signed)
No chest pain or sings of acute coronary syndrome.  Dyslipidemia, continue with statin therapy.

## 2022-08-11 NOTE — Assessment & Plan Note (Addendum)
Stable renal function with serum cr at 1,18, with K at 4,1 and serum bicarbonate at 24.  Na 138. Mg 2,5   Continue aggressive diuresis. Follow up renal function in am.  Avoid hypotension or nephrotoxic medications.

## 2022-08-11 NOTE — TOC Initial Note (Signed)
Transition of Care [facility]) - Initial/Assessment Note    Patient Details  Name: [name] MRN: [id number] Date of Birth: 1950-05-20  Transition of Care [facility]) CM/SW Contact:    Leone Haven, RN Phone Number: 08/11/2022, 1:56 PM  Clinical Narrative:                 From home with wife, indep pta, PCP is Dr. Loreta Ave, he has insurance on file.  He currently does not have any HH services in place.  He has home oxyen with [employer] 5 liters, [employer] in [place]. His concentrator goes up to 5 liters.   His wife is his support system and will transport him home at dc.  [facility] [place], he still works in [employer]. He states his boss said he will be able to work from home also.   Expected Discharge Plan: Home w Home Health Services Barriers to Discharge: Continued Medical Work up   Patient Goals and CMS Choice Patient states their goals for this hospitalization and ongoing recovery are:: return home   Choice offered to / list presented to : NA      Expected Discharge Plan and Services In-house Referral: NA Discharge Planning Services: CM Consult Post Acute Care Choice: NA Living arrangements for the past 2 months: Single Family Home                 DME Arranged: N/A DME Agency: NA       HH Arranged: NA          Prior Living Arrangements/Services Living arrangements for the past 2 months: Single Family Home Lives with:: Spouse Patient language and need for interpreter reviewed:: Yes Do you feel safe going back to the place where you live?: Yes      Need for Family Participation in Patient Care: Yes (Comment) Care giver support system in place?: Yes (comment) Current home services: DME (home oxyten 5 liters with [employer],) Criminal Activity/Legal Involvement Pertinent to Current Situation/Hospitalization: No - Comment as needed  Activities of Daily Living      Permission Sought/Granted                   Emotional Assessment Appearance:: Appears stated age Attitude/Demeanor/Rapport: Engaged Affect (typically observed): Appropriate Orientation: : Oriented to Self, Oriented to Place, Oriented to  Time, Oriented to Situation Alcohol / Substance Use: Not Applicable Psych Involvement: No (comment)  Admission diagnosis:  Acute respiratory failure with hypoxia (HCC) [J96.01] Acute on chronic respiratory failure with hypoxia (HCC) [J96.21] Community acquired pneumonia, unspecified laterality [J18.9] Patient Active Problem List   Diagnosis Date Noted   Acute on chronic respiratory failure with hypoxia (HCC) 08/09/2022   Acute on chronic diastolic CHF (congestive heart failure) (HCC) 08/09/2022   Physical deconditioning 05/13/2022   Chronic respiratory failure with hypoxia (HCC) 03/19/2022   ILD (interstitial lung disease) (HCC) 03/19/2022   S/P TAVR (transcatheter aortic valve replacement) 03/16/2022   Hypoxia 12/18/2021   Pulmonary fibrosis (HCC) 12/18/2021   PAF (paroxysmal atrial fibrillation) (HCC) 12/17/2021   Presence of Watchman left atrial appendage closure device 12/17/2021   Benign neoplasm of colon    Hepatic cirrhosis (HCC)    Gastritis and gastroduodenitis    Esophageal varices without bleeding (HCC)    Severe aortic stenosis 10/11/2017   Murmur 06/26/2015   History of colon polyps 09/20/2013   Impaired glucose tolerance 09/20/2013   Exogenous obesity 09/20/2013   Thrombocytopenia, unspecified (HCC) 09/20/2013  Bruit 08/28/2013   Chronic combined systolic and diastolic heart failure (HCC) 12/09/2008   DYSPNEA 12/09/2008   OBESITY 07/19/2008   WEIGHT GAIN 07/15/2008   ADJUSTMENT DISORDER WITH DEPRESSED MOOD 09/07/2007   CELLULITIS, RIGHT LEG 07/04/2007   Dyslipidemia 08/09/2006   Essential hypertension 08/09/2006   MYOCARDIAL INFARCTION, HX OF 08/09/2006   Coronary atherosclerosis 08/09/2006   NEPHROLITHIASIS, HX OF 08/09/2006   PCP:  Philip Aspen, Limmie Patricia,  MD Pharmacy:   [phone] [place] [place] [facility] [address] [place] [address] Phone: [phone] Fax: [phone]  [facility] [address] [place] [address] Phone: [phone] Fax: [phone]  [facility] - [place], [place] - [address] [place] [address] Phone: [phone] Fax: [phone]  [facility] [address] [place] [address] Phone: [phone] Fax: [phone]     Social Determinants of Health (SDOH) Social History: SDOH Screenings   Food Insecurity: No Food Insecurity (12/17/2021)  Housing: Low Risk  (08/11/2022)  Transportation Needs: No Transportation Needs (08/11/2022)  Utilities: Not At Risk (12/17/2021)  Alcohol Screen: Low Risk  (08/11/2022)  Depression (PHQ2-9): Low Risk  (06/10/2022)  Financial Resource Strain: Low Risk  (08/11/2022)  Tobacco Use: Low Risk  (08/11/2022)   SDOH Interventions: Housing Interventions: Intervention Not Indicated Transportation Interventions: Intervention Not Indicated Alcohol Usage Interventions: Intervention Not Indicated (Score <7) Financial Strain Interventions: Intervention Not Indicated   Readmission Risk Interventions    12/18/2021   11:14 AM  Readmission Risk Prevention Plan  Post Dischage Appt Complete  Medication Screening Complete  Transportation Screening Complete

## 2022-08-11 NOTE — Assessment & Plan Note (Signed)
Continue with pantoprazole/.  

## 2022-08-11 NOTE — Progress Notes (Addendum)
   Heart Failure Stewardship Pharmacist Progress Note   PCP: Philip Aspen, Limmie Patricia, MD PCP-Cardiologist: Olga Millers, MD    HPI:  72 yo M with PMH of HTN, HLD, CHF, CAD s/p CABG, aortic stenosis s/p TAVR, afib s/p Watchman, pulmonary fibrosis, ILD, chronic respiratory failure on 5L O2 at baseline, and cirrhosis.   Presented to the ED on 7/8 with shortness of breath, weight gain, and LE edema. Was hypoxic in the 80s on 6L O2 and was placed on nonrebreather at 15L. BNP elevated to 1031. CXR showed cardiomegaly without overt edema and atelectasis vs PNA. CTA negative for PE. Last ECHO from 06/2022 showed LVEF 50-55%, regional wall motion abnormalities, mild LVH, RV normal. Amyloid screen was negative 05/2022.  Patient was transitioned from furosemide to torsemide as outpatient on 04/23/22. He has been getting both medications refilled at the pharmacy. His wife states he has been alternating torsemide and furosemide based on his response to the diuretic.  Current HF Medications: Diuretic: furosemide 80 mg IV BID Beta Blocker: carvedilol 3.125 mg BID MRA: spironolactone 25 mg daily SGLT2i: Jardiance 10 mg daily  Prior to admission HF Medications: Diuretic: furosemide 80 mg daily AND torsemide 60 mg daily Beta blocker: carvedilol 3.125 mg BID SGLT2i: Jardiance 10 mg daily  Pertinent Lab Values: Serum creatinine 1.01, BUN 9, Potassium 3.2, Sodium 137, BNP 1031.6, Magnesium 2.0  Vital Signs: Weight: 219 lbs (admission weight: 212 lbs) Blood pressure: 110-120/50s  Heart rate: 60s  I/O: net -4.4L yesterday; net -5.2L since admission  Medication Assistance / Insurance Benefits Check: Does the patient have prescription insurance?  Yes Type of insurance plan: [facility] Medicare  Outpatient Pharmacy:  [facility] the [facility] willing [facility] use [facility] TOC pharmacy at discharge? Yes Is the patient willing to transition their outpatient pharmacy to utilize a [facility] outpatient pharmacy?   No    Assessment: 1. Acute on chronic diastolic CHF (LVEF 50-55%). NYHA class III-IV symptoms. - Continue furosemide 80 mg IV BID. Good urine output so far. KCl 40 mEq x 2 ordered for replacement. Will need to discontinue the furosemide prescription at discharge. - Continue carvedilol 3.125 mg BID - Continue spironolactone 25 mg daily - Continue Jardiance 10 mg daily   Plan: 1) Medication changes recommended at this time: - Continue IV diuresis - Stop PO furosemide on discharge  2) Patient assistance: - None pending  3)  Education  - Patient has been educated on current HF medications and potential additions to HF medication regimen - Patient verbalizes understanding that over the next few months, these medication doses may change and more medications may be added to optimize HF regimen - Patient has been educated on basic disease state pathophysiology and goals of therapy   Sharen Hones, PharmD, BCPS Heart Failure Stewardship Pharmacist Phone [phone]

## 2022-08-11 NOTE — Assessment & Plan Note (Signed)
Patient sinus rhythm with bradycardia rate 60 bpm, will continue with carvedilol with holding parameters. Sp watchman procedure, not on anticoagulation.

## 2022-08-11 NOTE — Assessment & Plan Note (Addendum)
Echocardiogram with preserved LV systolic function with EF 50 to 55%, mild LVH of the basal segment. Hypokinesis of the apical segment. RV systolic function preserved. LA with moderate dilatation. SP TAVR  Urine output is 3,600 ml Systolic blood pressure 111 to 120 mmHg.   Plan to continue diuresis with IV furosemide. Continue spironolactone and SGLT 2 inh to augment diuresis.  Carvedilol with holding parameters due to bradycardia.   Acute on chronic hypoxemic respiratory failure.  Follow up chest radiograph with bilateral hilar vascular congestion with no new infiltrates (personally reviewed).  Patient continue with high 02 requirements, but better from yesterday from 50 L/min to 30 L/min, keep 02 saturation 88% or greater.

## 2022-08-11 NOTE — Assessment & Plan Note (Addendum)
No signs of decompensation, Positive esophageal varices, I suspect he may have porto pulmonary hypertension.   Will liberate his diet, but will continue with fluid restriction. Consult nutrition.  Add trazodone for sleep, avoid benzodiazepines,

## 2022-08-11 NOTE — Assessment & Plan Note (Addendum)
Patient with sinus bradycardia with occasional PVC. Reate 60 bpm,continue carvedilol with holding parameters.

## 2022-08-12 ENCOUNTER — Inpatient Hospital Stay (HOSPITAL_COMMUNITY): Payer: 59

## 2022-08-12 ENCOUNTER — Encounter (HOSPITAL_COMMUNITY): Payer: 59

## 2022-08-12 DIAGNOSIS — J849 Interstitial pulmonary disease, unspecified: Secondary | ICD-10-CM | POA: Diagnosis not present

## 2022-08-12 DIAGNOSIS — I10 Essential (primary) hypertension: Secondary | ICD-10-CM | POA: Diagnosis not present

## 2022-08-12 DIAGNOSIS — I5033 Acute on chronic diastolic (congestive) heart failure: Secondary | ICD-10-CM | POA: Diagnosis not present

## 2022-08-12 DIAGNOSIS — E876 Hypokalemia: Secondary | ICD-10-CM | POA: Diagnosis not present

## 2022-08-12 LAB — BASIC METABOLIC PANEL
Anion gap: 5 (ref 5–15)
BUN: 13 mg/dL (ref 8–23)
CO2: 24 mmol/L (ref 22–32)
Calcium: 8.5 mg/dL — ABNORMAL LOW (ref 8.9–10.3)
Chloride: 109 mmol/L (ref 98–111)
Creatinine, Ser: 1.11 mg/dL (ref 0.61–1.24)
GFR, Estimated: >60 mL/min (ref >60)
Glucose, Bld: 117 mg/dL — ABNORMAL HIGH (ref 70–99)
Potassium: 4.2 mmol/L (ref 3.5–5.1)
Sodium: 138 mmol/L (ref 135–145)

## 2022-08-12 LAB — MAGNESIUM: Magnesium: 2.4 mg/dL (ref 1.7–2.4)

## 2022-08-12 MED ORDER — CARVEDILOL 3.125 MG PO TABS
3.1250 mg | ORAL_TABLET | Freq: Two times a day (BID) | ORAL | Status: DC
  Administered 2022-08-12 – 2022-08-18 (×11): 3.125 mg via ORAL
  Filled 2022-08-12 (×14): qty 1

## 2022-08-12 MED ORDER — ALUM & MAG HYDROXIDE-SIMETH 200-200-20 MG/5ML PO SUSP
15.0000 mL | Freq: Four times a day (QID) | ORAL | Status: DC | PRN
  Administered 2022-08-12 – 2022-08-17 (×2): 15 mL via ORAL
  Filled 2022-08-12 (×2): qty 30

## 2022-08-12 MED ORDER — MELATONIN 3 MG PO TABS
3.0000 mg | ORAL_TABLET | Freq: Every day | ORAL | Status: DC
  Filled 2022-08-12: qty 1

## 2022-08-12 NOTE — Progress Notes (Signed)
PT Cancellation Note  Patient Details Name: [name] MRN: [id number] DOB: 03-Jan-1951   Cancelled Treatment:    Reason Eval/Treat Not Completed: Medical issues which prohibited therapy (Nurse and RT may place pt on Bipap.  Will return at later date.)   [name] 08/12/2022, 10:57 AM Gerard Bonus M,PT Acute Rehab Services [phone]

## 2022-08-12 NOTE — Progress Notes (Addendum)
Progress Note   Patient: [name] MVH:[id number] DOB: 10-03-1950 DOA: 08/09/2022     2 DOS: the patient was seen and examined on 08/12/2022   Brief hospital course: [name] was admitted to the hospital with the working diagnosis of acute on chronic respiratory failure.   72 yo male with the past medical history of pulmonary fibrosis, chronic hypoxemic respiratory failure, cirrhosis due to [facility], hypertension, hyperlipidemia, aortic stenosis sp ATVR, atrial fibrillation and heart failure who presented with dyspnea. Reported 3 days of worsening dyspnea and lower extremity edema. 15 lbs weight gain over last 2 months. On his initial physical examination his blood pressure was 137/57, HR 70, RR 25 and 02 saturation 97%, lungs with rales bilaterally, heart with S1 and S2 present and rhythmic, positive systolic murmur at the right lower sternal border, abdomen with no distention and positive lower extremity edema.   Chest radiograph with cardiomegaly, with bilateral hilar vascular congestion, no effusions. Sternotomy wires in place.   CT chest with bilateral ground glass opacities. Small right pleural effusion.   EKG 69 bpm, left axis deviation, left anterior fascicular block, right bundle branch block, qtc 508, sinus rhythm with PVC, no significant ST segment changes or T wave changes.   Patient was placed on furosemide for diuresis.   Assessment and Plan: * Acute on chronic diastolic CHF (congestive heart failure) (HCC) Echocardiogram with preserved LV systolic function with EF 50 to 55%, mild LVH of the basal segment. Hypokinesis of the apical segment. RV systolic function preserved. LA with moderate dilatation. SP TAVR  Urine output is 3,350 ml Systolic blood pressure 110 to 120 mmHg.   Plan to continue diuresis with IV furosemide. Continue spironolactone and SGLT 2 inh to augment diuresis.  Hold carvedilol due to bradycardia.   Acute on chronic hypoxemic respiratory failure.   Patient continue with high 02 requirements, today is on heated high flow nasal cannula 50 L/min. Plan to repeat chest radiograph.  Patient with very poor oral intake, consult nutrition.   ILD (interstitial lung disease) (HCC) No clinical signs of acute exacerbation. Plan to continue diuresis and supplemental 02 per [place].  CT chest with no pulmonary embolism, chronic subpleural reticulation and ground glass opacities.  Possible porto pulmonary hypertension.  Continue with pirfenidone.   Essential hypertension Patient with sinus bradycardia with occasional PVC. Reate 60 bpm, will place holding parameters to carvedilol.   Hypokalemia Continue with hypervolemia, renal function with serum cr at 1,1 with K at 4,2 and serum bicarbonate at 24. Na 138, Mg 2.4   Plan to continue diuresis with furosemide, spironolactone and SGLT 2 inh. Follow up renal function in am.   PAF (paroxysmal atrial fibrillation) (HCC) Patient sinus rhythm with bradycardia rate 60 bpm, will continue with carvedilol with holding parameters. Sp watchman procedure, not on anticoagulation.  Coronary atherosclerosis No chest pain or sings of acute coronary syndrome.  Dyslipidemia, continue with statin therapy.  Hepatic cirrhosis (HCC) No signs of decompensation, Positive esophageal varices.   Gastritis and gastroduodenitis Continue with pantoprazole.         Subjective: Patient with improvement of dyspnea while on heated high flow nasal cannula, when his 02 saturation goes down it take long time to recover.   Physical Exam: Vitals:   08/12/22 1039 08/12/22 1055 08/12/22 1105 08/12/22 1140  BP: (!) 126/49  (!) 126/49   Pulse: 65 62 64   Resp: 20 19 20    Temp: 98.5 F (36.9 C)     TempSrc: Oral  SpO2: (!) 84% (!) 89%  95%  Weight:      Height:       Neurology awake and alert ENT with mild pallor, heated high flow nasal cannula in place Cardiovascular with S1 and S2 present and regular, with  systolic murmur at the right lower sternal border. No JVD Positive lower extremity edema ++ pitting Respiratory with scattered rales with no wheezing. No ronchi Abdomen with no distention, no ascites.  Data Reviewed:    Family Communication: no family at the bedside   Disposition: Status is: Inpatient Remains inpatient appropriate because: high 02 requirements   Planned Discharge Destination: Home   Author: Coralie Keens, MD 08/12/2022 12:31 PM  For on call review [email or web address].

## 2022-08-12 NOTE — Plan of Care (Signed)
  Problem: Education: Goal: Ability to demonstrate management of disease process will improve Outcome: Progressing Goal: Individualized Educational Video(s) Outcome: Progressing   Problem: Activity: Goal: Capacity to carry out activities will improve Outcome: Progressing   

## 2022-08-12 NOTE — Progress Notes (Signed)
   08/11/22 2340  BiPAP/CPAP/SIPAP  $ Non-Invasive Ventilator  Non-Invasive Vent Initial  $ Face Mask Large  Yes  BiPAP/CPAP/SIPAP Pt Type Adult  BiPAP/CPAP/SIPAP V60  Mask Type Full face mask  Mask Size Large  Set Rate 12 breaths/min  Respiratory Rate 20 breaths/min  IPAP 10 cmH20  EPAP 5 cmH2O  FiO2 (%) 100 %  Minute Ventilation 12.5  Leak 10  Peak Inspiratory Pressure (PIP) 10  Tidal Volume (Vt) 633  Patient Home Equipment No  Press High Alarm 30 cmH2O  Press Low Alarm 5 cmH2O  BiPAP/CPAP /SiPAP Vitals  Pulse Rate (!) 57  Resp 19  SpO2 95 %  MEWS Score/Color  MEWS Score 0  MEWS Score Color [name]

## 2022-08-12 NOTE — Progress Notes (Signed)
Patient placed on BIPAP due to increased work of breathing. Patient currently tolerating settings. RT will continue to monitor.

## 2022-08-12 NOTE — Progress Notes (Signed)
   Heart Failure Stewardship Pharmacist Progress Note   PCP: Philip Aspen, Limmie Patricia, MD PCP-Cardiologist: Olga Millers, MD    HPI:  72 yo M with PMH of HTN, HLD, CHF, CAD s/p CABG, aortic stenosis s/p TAVR, afib s/p Watchman, pulmonary fibrosis, ILD, chronic respiratory failure on 5L O2 at baseline, and cirrhosis.   Presented to the ED on 7/8 with shortness of breath, weight gain, and LE edema. Was hypoxic in the 80s on 6L O2 and was placed on nonrebreather at 15L. BNP elevated to 1031. CXR showed cardiomegaly without overt edema and atelectasis vs PNA. CTA negative for PE. Last ECHO from 06/2022 showed LVEF 50-55%, regional wall motion abnormalities, mild LVH, RV normal. Amyloid screen was negative 05/2022.  Patient was transitioned from furosemide to torsemide as outpatient on 04/23/22. He has been getting both medications refilled at the pharmacy. His wife states he has been alternating torsemide and furosemide based on his response to the diuretic.  Current HF Medications: Diuretic: furosemide 80 mg IV BID Beta Blocker: carvedilol 3.125 mg BID MRA: spironolactone 25 mg daily SGLT2i: Jardiance 10 mg daily  Prior to admission HF Medications: Diuretic: furosemide 80 mg daily AND torsemide 60 mg daily Beta blocker: carvedilol 3.125 mg BID SGLT2i: Jardiance 10 mg daily  Pertinent Lab Values: Serum creatinine 1.11, BUN 13, Potassium 4.2, Sodium 138, BNP 1031.6, Magnesium 2.4  Vital Signs: Weight: 204 lbs (admission weight: 212 lbs) Blood pressure: 120/50s  Heart rate: 60s  I/O: net -4.4L yesterday; net -5.2L since admission  Medication Assistance / Insurance Benefits Check: Does the patient have prescription insurance?  Yes Type of insurance plan: [facility] Medicare  Outpatient Pharmacy:  Prior to admission outpatient [facility] Is the patient willing to use [facility] TOC pharmacy at discharge? Yes Is the patient willing to transition their outpatient pharmacy to utilize a [facility] outpatient pharmacy?   No    Assessment: 1. Acute on chronic diastolic CHF (LVEF 50-55%). NYHA class III-IV symptoms. - Continue furosemide 80 mg IV BID. Good urine output so far. LE edema improving, still with significant edema. Strict I/Os and daily weights. Keep K>4 and Mg>2. Will need to discontinue the furosemide prescription at discharge. - Continue carvedilol 3.125 mg BID - Continue spironolactone 25 mg daily - Continue Jardiance 10 mg daily   Plan: 1) Medication changes recommended at this time: - Continue IV diuresis - Stop PO furosemide on discharge  2) Patient assistance: - None pending  3)  Education  - Patient has been educated on current HF medications and potential additions to HF medication regimen - Patient verbalizes understanding that over the next few months, these medication doses may change and more medications may be added to optimize HF regimen - Patient has been educated on basic disease state pathophysiology and goals of therapy   Sharen Hones, PharmD, BCPS Heart Failure Stewardship Pharmacist Phone [phone]

## 2022-08-12 NOTE — Progress Notes (Signed)
Called by RN to asses patient O2 saturation. Sustaining 88-90% on 15L HFNC. Placed on HHFNC 30L 95%. No distress noted at this time. Spo2 @95 %.

## 2022-08-12 NOTE — Hospital Course (Addendum)
[name] was admitted to the hospital with the working diagnosis of acute on chronic respiratory failure.   72 yo male with the past medical history of pulmonary fibrosis, chronic hypoxemic respiratory failure, cirrhosis due to [facility], hypertension, hyperlipidemia, aortic stenosis sp ATVR, atrial fibrillation and heart failure who presented with dyspnea. Reported 3 days of worsening dyspnea and lower extremity edema. 15 lbs weight gain over last 2 months. On his initial physical examination his blood pressure was 137/57, HR 70, RR 25 and 02 saturation 97%, lungs with rales bilaterally, heart with S1 and S2 present and rhythmic, positive systolic murmur at the right lower sternal border, abdomen with no distention and positive lower extremity edema.   Chest radiograph with cardiomegaly, with bilateral hilar vascular congestion, no effusions. Sternotomy wires in place.   CT chest with bilateral ground glass opacities. Small right pleural effusion.   EKG 69 bpm, left axis deviation, left anterior fascicular block, right bundle branch block, qtc 508, sinus rhythm with PVC, no significant ST segment changes or T wave changes.   Patient was placed on furosemide for diuresis.   07/12 patient with high 02 requirements, heated high flow nasal cannula.  07/13 patient last night had anxiety, he did received lorazepam 0.25 mg IV Today 02 flow down to 20 L/min.

## 2022-08-13 DIAGNOSIS — E876 Hypokalemia: Secondary | ICD-10-CM | POA: Diagnosis not present

## 2022-08-13 DIAGNOSIS — J849 Interstitial pulmonary disease, unspecified: Secondary | ICD-10-CM | POA: Diagnosis not present

## 2022-08-13 DIAGNOSIS — I10 Essential (primary) hypertension: Secondary | ICD-10-CM | POA: Diagnosis not present

## 2022-08-13 DIAGNOSIS — I5033 Acute on chronic diastolic (congestive) heart failure: Secondary | ICD-10-CM | POA: Diagnosis not present

## 2022-08-13 LAB — BASIC METABOLIC PANEL
Anion gap: 9 (ref 5–15)
BUN: 14 mg/dL (ref 8–23)
CO2: 22 mmol/L (ref 22–32)
Calcium: 8.3 mg/dL — ABNORMAL LOW (ref 8.9–10.3)
Chloride: 107 mmol/L (ref 98–111)
Creatinine, Ser: 1.11 mg/dL (ref 0.61–1.24)
GFR, Estimated: >60 mL/min (ref >60)
Glucose, Bld: 112 mg/dL — ABNORMAL HIGH (ref 70–99)
Potassium: 3.8 mmol/L (ref 3.5–5.1)
Sodium: 138 mmol/L (ref 135–145)

## 2022-08-13 MED ORDER — ADULT MULTIVITAMIN W/MINERALS CH
1.0000 | ORAL_TABLET | Freq: Every day | ORAL | Status: DC
  Administered 2022-08-13 – 2022-08-19 (×7): 1 via ORAL
  Filled 2022-08-13 (×7): qty 1

## 2022-08-13 MED ORDER — PROSOURCE PLUS PO LIQD
30.0000 mL | Freq: Three times a day (TID) | ORAL | Status: DC
  Administered 2022-08-13 – 2022-08-19 (×16): 30 mL via ORAL
  Filled 2022-08-13 (×15): qty 30

## 2022-08-13 MED ORDER — POTASSIUM CHLORIDE CRYS ER 20 MEQ PO TBCR
40.0000 meq | EXTENDED_RELEASE_TABLET | Freq: Once | ORAL | Status: AC
  Administered 2022-08-13: 40 meq via ORAL
  Filled 2022-08-13: qty 2

## 2022-08-13 MED ORDER — ENSURE ENLIVE PO LIQD
237.0000 mL | Freq: Two times a day (BID) | ORAL | Status: DC
  Administered 2022-08-14 – 2022-08-17 (×5): 237 mL via ORAL

## 2022-08-13 NOTE — Plan of Care (Signed)
  Problem: Education: Goal: Ability to demonstrate management of disease process will improve Outcome: Progressing Goal: Ability to verbalize understanding of medication therapies will improve Outcome: Progressing Goal: Individualized Educational Video(s) Outcome: Progressing   Problem: Activity: Goal: Capacity to carry out activities will improve Outcome: Progressing   Problem: Cardiac: Goal: Ability to achieve and maintain adequate cardiopulmonary perfusion will improve Outcome: Progressing   Problem: Clinical Measurements: Goal: Ability to maintain clinical measurements within normal limits will improve Outcome: Progressing Goal: Will remain free from infection Outcome: Progressing Goal: Diagnostic test results will improve Outcome: Progressing Goal: Respiratory complications will improve Outcome: Progressing Goal: Cardiovascular complication will be avoided Outcome: Progressing   

## 2022-08-13 NOTE — Plan of Care (Signed)
  Problem: Education: Goal: Ability to demonstrate management of disease process will improve Outcome: Progressing Goal: Ability to verbalize understanding of medication therapies will improve Outcome: Progressing Goal: Individualized Educational Video(s) Outcome: Progressing   Problem: Activity: Goal: Capacity to carry out activities will improve Outcome: Progressing   Problem: Education: Goal: Knowledge of General Education information will improve Description: Including pain rating scale, medication(s)/side effects and non-pharmacologic comfort measures Outcome: Progressing   Problem: Health Behavior/Discharge Planning: Goal: Ability to manage health-related needs will improve Outcome: Progressing   Problem: Clinical Measurements: Goal: Ability to maintain clinical measurements within normal limits will improve Outcome: Progressing

## 2022-08-13 NOTE — Progress Notes (Signed)
Physical Therapy Treatment Patient Details Name: [name] MRN: [id number] DOB: 02/28/1950 Today's Date: 08/13/2022   History of Present Illness Pt is a 72 year old man admitted on 7/8 with shortness of breath and LE swelling. Significant Resp distress with heated HFNC.  PMH: pulmonary fibrosis on 5L O2, HTN, HLD, CHF, CAD s/p CABG, aortic stenosis s/p TAVR, afib s/p Watchman, Cirrhosis.    PT Comments  Pt admitted with above diagnosis. Pt able to ambulate a short distance today with HHA of 1 limited in distance by heated HFNC as well as by desaturation. Pt on 90% FiO2 at 30L/min at rest and with activity and pt sats on arrival were 90%.  Pt desaturated to 84% with 12 feet of ambulation and it took about 4 min for pt to return to 88% and above.  Pt then transferred to chair and desaturated again to 84% and took 4 min to recover.  Pt with very poor endurance for activity.  Will continue to progress pt as pt tolerates.   Discussed equipment needs with pt and caregiver and they would appreiciate a wedge for the bed for pt to elevate his upper body for better breathing, a rollator with an oxygen holder, wheelchair with cushion, 3N1 and possible shower chair.  Pt currently with functional limitations due to the deficits listed below (see PT Problem List). Pt will benefit from acute skilled PT to increase their independence and safety with mobility to allow discharge.        Assistance Recommended at Discharge Intermittent Supervision/Assistance  If plan is discharge home, recommend the following:  Can travel by private vehicle    A little help with walking and/or transfers;A little help with bathing/dressing/bathroom;Assistance with cooking/housework;Assist for transportation;Help with stairs or ramp for entrance      Equipment Recommendations  Rollator (4 wheels);Wheelchair (measurements PT);Wheelchair cushion (measurements PT);BSC/3in1;Other (comment) (Wedge for bed, possible shower chair)     Recommendations for Other Services       Precautions / Restrictions Precautions Precautions: Fall Precaution Comments: on HHFNC Restrictions Weight Bearing Restrictions: No     Mobility  Bed Mobility Overal bed mobility: Modified Independent             General bed mobility comments: rolled with rail.  Desaturates wtih each transition.    Transfers Overall transfer level: Needs assistance Equipment used: 2 person hand held assist, 1 person hand held assist Transfers: Sit to/from Stand Sit to Stand: Min guard           General transfer comment: Pt stood to his feet holding onto PT.  Maintains flexed posture at trunk and hips/knees which friend states is premorbid.   Pt sats while sitting 87% and >.    Ambulation/Gait Ambulation/Gait assistance: Min guard Gait Distance (Feet): 12 Feet Assistive device: 1 person hand held assist, 2 person hand held assist Gait Pattern/deviations: Step-through pattern, Decreased stride length, Knee flexed in stance - right, Knee flexed in stance - left, Shuffle, Drifts right/left, Trunk flexed, Wide base of support   Gait velocity interpretation: <1.31 ft/sec, indicative of household ambulator   General Gait Details: once in standing, pt did laps (length of how far the heated HFNC allowed) about 12 feet prior to O2 desaturating and then allowed pt to sit down and rest. Took about 4 min for pt to return to 88% and above.  Transferred pt to chair to sit up a bit.  Pt very fatigued. After transfer, again saturations took about 4 min to  return to 88% and >.  Nurse aware.   Stairs             Wheelchair Mobility     Tilt Bed    Modified Rankin (Stroke Patients Only)       Balance                                            Cognition Arousal/Alertness: Awake/alert Behavior During Therapy: WFL for tasks assessed/performed Overall Cognitive Status: Within Functional Limits for tasks assessed                                           Exercises General Exercises - Lower Extremity Ankle Circles/Pumps: AROM, Both, 5 reps, Supine Heel Slides: AROM, Both, 5 reps, Supine    General Comments General comments (skin integrity, edema, etc.): HHFNC 90% on 30L/min.  Desaturates with activity and needs 4 min recovery time between bouts      Pertinent Vitals/Pain Pain Assessment Pain Assessment: Faces Faces Pain Scale: Hurts a little bit Pain Location: neck Pain Descriptors / Indicators: Discomfort Pain Intervention(s): Limited activity within patient's tolerance, Monitored during session, Repositioned    Home Living                          Prior Function            PT Goals (current goals can now be found in the care plan section) Acute Rehab PT Goals Patient Stated Goal: to go home Progress towards PT goals: Progressing toward goals    Frequency    Min 1X/week      PT Plan Current plan remains appropriate    Co-evaluation              AM-PAC PT "6 Clicks" Mobility   Outcome Measure  Help needed turning from your back to your side while in a flat bed without using bedrails?: None Help needed moving from lying on your back to sitting on the side of a flat bed without using bedrails?: A Little Help needed moving to and from a bed to a chair (including a wheelchair)?: A Little Help needed standing up from a chair using your arms (e.g., wheelchair or bedside chair)?: A Little Help needed to walk in hospital room?: A Little Help needed climbing 3-5 steps with a railing? : A Lot 6 Click Score: 18    End of Session Equipment Utilized During Treatment: Gait belt;Oxygen Activity Tolerance: Patient limited by fatigue Patient left: with call bell/phone within reach;with family/visitor present;in chair;with chair alarm set Nurse Communication: Mobility status PT Visit Diagnosis: Muscle weakness (generalized) (M62.81);Unsteadiness on feet (R26.81)      Time: 1610-9604 PT Time Calculation (min) (ACUTE ONLY): 32 min  Charges:    $Gait Training: 8-22 mins $Therapeutic Exercise: 8-22 mins PT General Charges $$ ACUTE PT VISIT: 1 Visit                     [name],PT Acute Rehab Services [phone]    [name] 08/13/2022, 2:16 PM

## 2022-08-13 NOTE — Progress Notes (Signed)
Progress Note   Patient: [name] QIO:[id number] DOB: 25-Feb-1950 DOA: 08/09/2022     3 DOS: the patient was seen and examined on 08/13/2022   Brief hospital course: Mr. Manzano was admitted to the hospital with the working diagnosis of acute on chronic respiratory failure.   72 yo male with the past medical history of pulmonary fibrosis, chronic hypoxemic respiratory failure, cirrhosis due to [facility], hypertension, hyperlipidemia, aortic stenosis sp ATVR, atrial fibrillation and heart failure who presented with dyspnea. Reported 3 days of worsening dyspnea and lower extremity edema. 15 lbs weight gain over last 2 months. On his initial physical examination his blood pressure was 137/57, HR 70, RR 25 and 02 saturation 97%, lungs with rales bilaterally, heart with S1 and S2 present and rhythmic, positive systolic murmur at the right lower sternal border, abdomen with no distention and positive lower extremity edema.   Chest radiograph with cardiomegaly, with bilateral hilar vascular congestion, no effusions. Sternotomy wires in place.   CT chest with bilateral ground glass opacities. Small right pleural effusion.   EKG 69 bpm, left axis deviation, left anterior fascicular block, right bundle branch block, qtc 508, sinus rhythm with PVC, no significant ST segment changes or T wave changes.   Patient was placed on furosemide for diuresis.   07/12 patient with high 02 requirements, heated high flow nasal cannula.   Assessment and Plan: * Acute on chronic diastolic CHF (congestive heart failure) (HCC) Echocardiogram with preserved LV systolic function with EF 50 to 55%, mild LVH of the basal segment. Hypokinesis of the apical segment. RV systolic function preserved. LA with moderate dilatation. SP TAVR  Urine output is 3,600 ml Systolic blood pressure 111 to 120 mmHg.   Plan to continue diuresis with IV furosemide. Continue spironolactone and SGLT 2 inh to augment diuresis.  Carvedilol  with holding parameters due to bradycardia.   Acute on chronic hypoxemic respiratory failure.  Follow up chest radiograph with bilateral hilar vascular congestion with no new infiltrates (personally reviewed).  Patient continue with high 02 requirements, but better from yesterday from 50 L/min to 30 L/min, keep 02 saturation 88% or greater.   ILD (interstitial lung disease) (HCC) No clinical signs of acute exacerbation. Plan to continue diuresis and supplemental 02 per .  CT chest with no pulmonary embolism, chronic subpleural reticulation and ground glass opacities.  Possible porto pulmonary hypertension.  Continue with pirfenidone.   Essential hypertension Patient with sinus bradycardia with occasional PVC. Reate 60 bpm,continue carvedilol with holding parameters.   Hypokalemia Renal function stable with serum cr at 1,1 with K at 3.8 and serum bicarbonate at 22.  Na 138 and Mg 2.4   Plan to continue diuresis with furosemide, spironolactone and SGLT 2 inh. Add 40 meq Kcl to prevent hypokalemia.  Follow up renal function in am.   PAF (paroxysmal atrial fibrillation) (HCC) Patient sinus rhythm with bradycardia rate 60 bpm, will continue with carvedilol with holding parameters. Sp watchman procedure, not on anticoagulation.  Coronary atherosclerosis No chest pain or sings of acute coronary syndrome.  Dyslipidemia, continue with statin therapy.  Hepatic cirrhosis (HCC) No signs of decompensation, Positive esophageal varices.   Gastritis and gastroduodenitis Continue with pantoprazole.         Subjective: Patient with no chest pain, dyspnea and edema slowly improving, continue with poor po intake, very weak and deconditioned.   Physical Exam: Vitals:   08/13/22 0436 08/13/22 0724 08/13/22 0824 08/13/22 0842  BP: (!) 111/54 (!) 123/52  Pulse: (!) 57 61  67  Resp: 18 (!) 21    Temp: 98.2 F (36.8 C) 98.3 F (36.8 C)    TempSrc: Oral Oral    SpO2: 94% 94% 92%    Weight: 91.4 kg     Height:       Neurology awake and alert ENT with mild pallor Cardiovascular with S1 and S2 present and regular with no gallops, rubs or murmurs Mild JVD Respiratory with rales at bases with no wheezing Abdomen with no distention  Positive lower extremity edema ++  Data Reviewed:    Family Communication: I spoke with patient's wife at the bedside, we talked in detail about patient's condition, plan of care and prognosis and all questions were addressed.   Disposition: Status is: Inpatient Remains inpatient appropriate because: respiratory failure and IV diuresis   Planned Discharge Destination: Home     Author: Coralie Keens, MD 08/13/2022 11:36 AM  For on call review [email or web address].

## 2022-08-13 NOTE — Progress Notes (Signed)
   Heart Failure Stewardship Pharmacist Progress Note   PCP: Philip Aspen, Limmie Patricia, MD PCP-Cardiologist: Olga Millers, MD    HPI:  72 yo M with PMH of HTN, HLD, CHF, CAD s/p CABG, aortic stenosis s/p TAVR, afib s/p Watchman, pulmonary fibrosis, ILD, chronic respiratory failure on 5L O2 at baseline, and cirrhosis.   Presented to the ED on 7/8 with shortness of breath, weight gain, and LE edema. Was hypoxic in the 80s on 6L O2 and was placed on nonrebreather at 15L. BNP elevated to 1031. CXR showed cardiomegaly without overt edema and atelectasis vs PNA. CTA negative for PE. Last ECHO from 06/2022 showed LVEF 50-55%, regional wall motion abnormalities, mild LVH, RV normal. Amyloid screen was negative 05/2022.  Patient was transitioned from furosemide to torsemide as outpatient on 04/23/22. He has been getting both medications refilled at the pharmacy. His wife states he has been alternating torsemide and furosemide based on his response to the diuretic.  Current HF Medications: Diuretic: furosemide 80 mg IV BID Beta Blocker: carvedilol 3.125 mg BID MRA: spironolactone 25 mg daily SGLT2i: Jardiance 10 mg daily  Prior to admission HF Medications: Diuretic: furosemide 80 mg daily AND torsemide 60 mg daily Beta blocker: carvedilol 3.125 mg BID SGLT2i: Jardiance 10 mg daily  Pertinent Lab Values: Serum creatinine 1.11, BUN 14, Potassium 3.8, Sodium 138, BNP 1031.6, Magnesium 2.4  Vital Signs: Weight: 201 lbs (admission weight: 212 lbs) Blood pressure: 110-120/50s  Heart rate: 50-60s  I/O: net -3.4L yesterday; net -9.4L since admission  Medication Assistance / Insurance Benefits Check: Does the patient have prescription insurance?  Yes Type of insurance plan: [facility] Medicare  Outpatient Pharmacy:  Prior to admission [facility] Is the patient willing to use [facility] TOC pharmacy at discharge? Yes Is the patient willing to transition their outpatient pharmacy to utilize a  [facility] outpatient pharmacy?   Patient is interested in the mail order option. He would like to consider this more and then will let me know when he decides.     Assessment: 1. Acute on chronic diastolic CHF (LVEF 50-55%). NYHA class III-IV symptoms. - Continue furosemide 80 mg IV BID. Good urine output so far. LE edema improving, still with edema. Strict I/Os and daily weights. Keep K>4 and Mg>2. Will need to discontinue the furosemide prescription at discharge. - Continue carvedilol 3.125 mg BID - Continue spironolactone 25 mg daily - Continue Jardiance 10 mg daily   Plan: 1) Medication changes recommended at this time: - Continue IV diuresis - Stop PO furosemide on discharge  2) Patient assistance: - None pending  3)  Education  - Patient has been educated on current HF medications and potential additions to HF medication regimen - Patient verbalizes understanding that over the next few months, these medication doses may change and more medications may be added to optimize HF regimen - Patient has been educated on basic disease state pathophysiology and goals of therapy   Sharen Hones, PharmD, BCPS Heart Failure Stewardship Pharmacist Phone [phone]

## 2022-08-13 NOTE — Progress Notes (Signed)
Initial Nutrition Assessment  DOCUMENTATION CODES:   Obesity unspecified  INTERVENTION:   -Liberalize diet to 2 gram sodium with 1.5 L fluid restriction for wider variety of meal selections -MVI with minerals daily -Ensure Enlive po BID, each supplement provides 350 kcal and 20 grams of protein -30 ml Prosource Plus TID, each supplement provides 100 kcals and 15 grams protein -Magic cup TID with meals, each supplement provides 290 kcal and 9 grams of protein   NUTRITION DIAGNOSIS:   Inadequate oral intake related to poor appetite as evidenced by meal completion < 25%.  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  Pt admitted with CHF.    Reviewed I/O's: -2.3 L x 24 hours and -9.4 L since admission  UOP: 3.6 L x 24 hours  Pt unavailable at time of visit. Attempted to speak with pt via call to hospital room phone, however, unable to reach. RD unable to obtain further nutrition-related history or complete nutrition-focused physical exam at this time.    Per H&P, pt with 15# wt gain over the past 2 months and worsening shortness of breath and lower extremity edema 3 days PTA.   Pt required Bi-pap yesterday. Currently on HFNC.   Per RN, pt consuming only bites and sips at meals. Noted meal completions 5-25%. Pt is currently on a heart healthy diet with 1.5 L fluid restriction.   Reviewed wt hx; pt has experienced a 8% wt loss over the  past month, which is significant for time frame. Per RN assessment, pt with deep pitting edema. Suspect this may be masking true weight loss as well as fat and muscle depletions.   Pt with no BM since 08/08/22. Per RN, pt is refusing PRN laxatives at this time.   Medications reviewed and include jardiance, lasix, and aldactone.   Lab Results  Component Value Date   HGBA1C 5.8 (A) 08/10/2021   PTA DM medications are none.   Labs reviewed: CBGS: 156 (inpatient orders for glycemic control are none).    Diet Order:   Diet Order             Diet Heart Room service appropriate? Yes; Fluid consistency: Thin; Fluid restriction: 1500 mL Fluid  Diet effective now                   EDUCATION NEEDS:   No education needs have been identified at this time  Skin:  Skin Assessment: Reviewed RN Assessment  Last BM:  08/08/22  Height:   Ht Readings from Last 1 Encounters:  08/10/22 5\' 6"  (1.676 m)    Weight:   Wt Readings from Last 1 Encounters:  08/13/22 91.4 kg    Ideal Body Weight:  64.5 kg  BMI:  Body mass index is 32.52 kg/m.  Estimated Nutritional Needs:   Kcal:  1750-1950  Protein:  90-105 grams  Fluid:  1.5 L    Levada Schilling, RD, LDN, CDCES Registered Dietitian II Certified Diabetes Care and Education Specialist Please refer to [facility]  for RD and/or RD on-call/weekend/after hours pager

## 2022-08-14 DIAGNOSIS — E876 Hypokalemia: Secondary | ICD-10-CM | POA: Diagnosis not present

## 2022-08-14 DIAGNOSIS — I10 Essential (primary) hypertension: Secondary | ICD-10-CM | POA: Diagnosis not present

## 2022-08-14 DIAGNOSIS — J849 Interstitial pulmonary disease, unspecified: Secondary | ICD-10-CM | POA: Diagnosis not present

## 2022-08-14 DIAGNOSIS — I5033 Acute on chronic diastolic (congestive) heart failure: Secondary | ICD-10-CM | POA: Diagnosis not present

## 2022-08-14 LAB — MAGNESIUM: Magnesium: 2.5 mg/dL — ABNORMAL HIGH (ref 1.7–2.4)

## 2022-08-14 LAB — BASIC METABOLIC PANEL
Anion gap: 8 (ref 5–15)
BUN: 13 mg/dL (ref 8–23)
CO2: 24 mmol/L (ref 22–32)
Calcium: 8.6 mg/dL — ABNORMAL LOW (ref 8.9–10.3)
Chloride: 106 mmol/L (ref 98–111)
Creatinine, Ser: 1.18 mg/dL (ref 0.61–1.24)
GFR, Estimated: >60 mL/min (ref >60)
Glucose, Bld: 132 mg/dL — ABNORMAL HIGH (ref 70–99)
Potassium: 4.1 mmol/L (ref 3.5–5.1)
Sodium: 138 mmol/L (ref 135–145)

## 2022-08-14 MED ORDER — LORAZEPAM 2 MG/ML IJ SOLN
0.2500 mg | Freq: Once | INTRAMUSCULAR | Status: AC | PRN
  Administered 2022-08-14: 0.25 mg via INTRAVENOUS
  Filled 2022-08-14: qty 1

## 2022-08-14 MED ORDER — POLYETHYLENE GLYCOL 3350 17 G PO PACK
17.0000 g | PACK | Freq: Every day | ORAL | Status: DC | PRN
  Administered 2022-08-14: 17 g via ORAL
  Filled 2022-08-14: qty 1

## 2022-08-14 MED ORDER — ONDANSETRON HCL 4 MG/2ML IJ SOLN
4.0000 mg | Freq: Four times a day (QID) | INTRAMUSCULAR | Status: DC | PRN
Start: 2022-08-14 — End: 2022-08-19
  Administered 2022-08-14 – 2022-08-17 (×2): 4 mg via INTRAVENOUS
  Filled 2022-08-14 (×2): qty 2

## 2022-08-14 MED ORDER — TRAZODONE HCL 50 MG PO TABS
50.0000 mg | ORAL_TABLET | Freq: Every day | ORAL | Status: DC
  Administered 2022-08-14 – 2022-08-18 (×5): 50 mg via ORAL
  Filled 2022-08-14 (×5): qty 1

## 2022-08-14 NOTE — Progress Notes (Addendum)
Progress Note   Patient: [name] ZOX:[id number] DOB: [birth year] DOA: 08/09/2022     4 DOS: the patient was seen and examined on 08/14/2022   Brief hospital course: [name] was admitted to the hospital with the working diagnosis of acute on chronic respiratory failure.   72 yo male with the past medical history of pulmonary fibrosis, chronic hypoxemic respiratory failure, cirrhosis due to [facility], hypertension, hyperlipidemia, aortic stenosis sp ATVR, atrial fibrillation and heart failure who presented with dyspnea. Reported 3 days of worsening dyspnea and lower extremity edema. 15 lbs weight gain over last 2 months. On his initial physical examination his blood pressure was 137/57, HR 70, RR 25 and 02 saturation 97%, lungs with rales bilaterally, heart with S1 and S2 present and rhythmic, positive systolic murmur at the right lower sternal border, abdomen with no distention and positive lower extremity edema.   Chest radiograph with cardiomegaly, with bilateral hilar vascular congestion, no effusions. Sternotomy wires in place.   CT chest with bilateral ground glass opacities. Small right pleural effusion.   EKG 69 bpm, left axis deviation, left anterior fascicular block, right bundle branch block, qtc 508, sinus rhythm with PVC, no significant ST segment changes or T wave changes.   Patient was placed on furosemide for diuresis.   07/12 patient with high 02 requirements, heated high flow nasal cannula.  07/13 patient last night had anxiety, he did received lorazepam 0.25 mg IV Today 02 flow down to 20 L/min.   Assessment and Plan: * Acute on chronic diastolic CHF (congestive heart failure) (HCC) Echocardiogram with preserved LV systolic function with EF 50 to 55%, mild LVH of the basal segment. Hypokinesis of the apical segment. RV systolic function preserved. LA with moderate dilatation. SP TAVR  Urine output is 2,850 ml Systolic blood pressure 120 to 126 mmHg.   Plan to  continue diuresis with IV furosemide. Continue spironolactone and SGLT 2 inh to augment diuresis.  Carvedilol with holding parameters due to bradycardia.   Acute on chronic hypoxemic respiratory failure.  Follow up chest radiograph with bilateral hilar vascular congestion with no new infiltrates (personally reviewed).  Patient continue with high 02 requirements, but able to wean to 20 L from 30 L/min.  At home he uses up to 5 L/min.   ILD (interstitial lung disease) (HCC) No clinical signs of acute exacerbation. Plan to continue diuresis and supplemental 02 per [place].  CT chest with no pulmonary embolism, chronic subpleural reticulation and ground glass opacities.  Possible porto pulmonary hypertension.  Continue with pirfenidone.   Essential hypertension Patient with sinus bradycardia with occasional PVC. Reate 60 bpm,continue carvedilol with holding parameters.   Hypokalemia Stable renal function with serum cr at 1,18, with K at 4,1 and serum bicarbonate at 24.  Na 138. Mg 2,5   Continue aggressive diuresis. Follow up renal function in am.  Avoid hypotension or nephrotoxic medications.   PAF (paroxysmal atrial fibrillation) (HCC) Patient sinus rhythm with bradycardia rate 60 bpm, will continue with carvedilol with holding parameters. Sp watchman procedure, not on anticoagulation.  Coronary atherosclerosis No chest pain or sings of acute coronary syndrome.  Dyslipidemia, continue with statin therapy.  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   Will liberate his diet, but will continue with fluid restriction. Consult nutrition.  Add trazodone for sleep, avoid benzodiazepines,   Gastritis and gastroduodenitis Continue with pantoprazole.         Subjective:  Patient very weak an deconditioned, last night not able to sleep well, this am his po intake better than yesterday.    Physical Exam: Vitals:   08/14/22 0400 08/14/22 0755 08/14/22 0800 08/14/22 0915  BP: (!) 118/49  (!) 123/48   Pulse: (!) 57 68 62   Resp: 17 17 15    Temp: (!) 97.5 F (36.4 C)  98.2 F (36.8 C)   TempSrc: Oral  Oral Oral  SpO2: 97% 97%    Weight:      Height:       Neurology awake and alert ENT with mild pallor Cardiovascular with S1 and S2 present and regular, with positive systolic murmur at the right lower sternal border.  Mild JVD Positive lower extremity edema ++ more right than left. Respiratory with rales at bases bilaterally with no wheezing or rhonchi Abdomen with no distention  Data Reviewed:    Family Communication: I spoke with patient's wife at the bedside, we talked in detail about patient's condition, plan of care and prognosis and all questions were addressed.   Disposition: Status is: Inpatient Remains inpatient appropriate because: heart failure with volume overload, IV diuresis.   Planned Discharge Destination: Home     Author: Coralie Keens, MD 08/14/2022 11:20 AM  For on call review [email or web address].

## 2022-08-14 NOTE — Progress Notes (Addendum)
TRH night cross cover note:   I was notified by RN that this patient's most recent bowel movement occurred on 08/08/2022, and that the patient is requesting MiraLAX for constipation.  I subsequently placed order for prn MiraLAX for constipation.    Update: Patient complaining of insomnia, which has been refractory to dose of melatonin earlier this evening.  He also notes that he feels anxious.  I subsequently placed order for low-dose of Ativan 0.25 mg IV once as needed for anxiety/insomnia.   Newton Pigg, DO Hospitalist

## 2022-08-14 NOTE — Progress Notes (Signed)
Occupational Therapy Treatment Patient Details Name: [name] MRN: [id number] DOB: January 21, 1951 Today's Date: 08/14/2022   History of present illness Pt is a 72 year old man admitted on 7/8 with shortness of breath and LE swelling. Significant Resp distress with heated HFNC.  PMH: pulmonary fibrosis on 5L O2, HTN, HLD, CHF, CAD s/p CABG, aortic stenosis s/p TAVR, afib s/p Watchman, Cirrhosis.   OT comments  Pt. And spouse seen for skilled OT treatment session.  Focus of session was introduction and review of energy conservation strategies along with review of PLB.  Pt. With notable fatigue with pt. And wife stating he has not been sleeping well.  Pt. Dozing on/off during education but wife able to provide description of current modifications and set up at home for energy conservation.  Reviewed handouts and also reviewed uses of dme that allow pt. To aide in safety during adls.  Receptive to recommendations especially for increasing safety during bathing. Rec. Shower seat and also discussed benefits of hand held shower head.  Agree with current d/c recommendations. Will cont. With acute OT POC.     Recommendations for follow up therapy are one component of a multi-disciplinary discharge planning process, led by the attending physician.  Recommendations may be updated based on patient status, additional functional criteria and insurance authorization.    Assistance Recommended at Discharge Frequent or constant Supervision/Assistance  Patient can return home with the following  A little help with walking and/or transfers;A little help with bathing/dressing/bathroom;Assistance with cooking/housework;Assist for transportation;Help with stairs or ramp for entrance   Equipment Recommendations  Tub/shower seat    Recommendations for Other Services      Precautions / Restrictions Precautions Precautions: Fall Precaution Comments: on [facility]       Mobility Bed Mobility                     Transfers                         Balance                                           ADL either performed or assessed with clinical judgement   ADL Overall ADL's : Needs assistance/impaired                                       General ADL Comments: pt. in recliner resting, wife present. focus of session was utilization of energy conservation strategies in conjunction with plb for pt. to cont. with adls in safe productive way.  reviewed handouts with pt. and spouse (pt. in/out of light sleep but making effort to participate).  wife able to also describe and provide examples of modifications in place at home for energy conservation.  reviewed 3 uses of 3n1 as she reports pt. likes to take very long hot showers. reviewed rec. for 3n1 in tub to allow pt. to sit and conserve energy and safety while allowing him to cont. his preferred length of shower. also reviewed benefits of hand held shower esp. if he would be sitting during shower.  receptive to all recommendations.  pt.s spouse reports they drive from outside of [place] to [place] for his medical apts. so they have a system for  what time to leave the house and what time of day are their preferred apt. times to allow him time to get to the apts. and proper travel traffic.    Extremity/Trunk Assessment              Vision       Perception     Praxis      Cognition Arousal/Alertness: Lethargic Behavior During Therapy: Flat affect Overall Cognitive Status: Within Functional Limits for tasks assessed                                 General Comments: very tired from not sleeping well previous night and days        Exercises      Shoulder Instructions       General Comments  Wife reports pt. Has a very good friend, that she has "shifts" with to allow her to rest also.  He is coming tomorrow to be with the pt. And spend time with him.  They play golf  together and like to watch golf together also    Pertinent Vitals/ Pain       Pain Assessment Pain Assessment: No/denies pain  Home Living                                          Prior Functioning/Environment              Frequency  Min 1X/week        Progress Toward Goals  OT Goals(current goals can now be found in the care plan section)  Progress towards OT goals: Progressing toward goals     Plan Discharge plan remains appropriate    Co-evaluation                 AM-PAC OT "6 Clicks" Daily Activity     Outcome Measure   Help from another person eating meals?: None Help from another person taking care of personal grooming?: A Little Help from another person toileting, which includes using toliet, bedpan, or urinal?: A Little Help from another person bathing (including washing, rinsing, drying)?: A Lot Help from another person to put on and taking off regular upper body clothing?: A Little Help from another person to put on and taking off regular lower body clothing?: A Lot 6 Click Score: 17    End of Session Equipment Utilized During Treatment: Oxygen  OT Visit Diagnosis: Other (comment)   Activity Tolerance Patient limited by lethargy;Patient limited by fatigue   Patient Left in chair;with call bell/phone within reach;with family/visitor present   Nurse Communication Other (comment) (rn states ok to work with pt. note on door to see rn first was due to him wanting to rest)        Time: 1610-9604 OT Time Calculation (min): 17 min  Charges: OT General Charges $OT Visit: 1 Visit OT Treatments $Self Care/Home Management : 8-22 mins  Boneta Lucks, COTA/L Acute Rehabilitation [phone]   Alessandra Bevels Lorraine-COTA/L 08/14/2022, 11:30 AM

## 2022-08-14 NOTE — Progress Notes (Signed)
Nutrition Follow-up  DOCUMENTATION CODES:   Obesity unspecified  INTERVENTION:   -Continue liberalized diet of regular -Continue MVI with minerals daily -Continue Ensure Enlive po BID, each supplement provides 350 kcal and 20 grams of protein -Continue 30 ml Prosource Plus TID, each supplement provides 100 kcals and 15 grams protein -Continue Magic cup TID with meals, each supplement provides 290 kcal and 9 grams of protein    NUTRITION DIAGNOSIS:   Inadequate oral intake related to poor appetite as evidenced by meal completion < 25%.  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  Reviewed I/O's: -1.3 L x 24 hours and -12.3 L since admission  UOP: 1.7 L x 24 hours  Pt unavailable at time of visit. Attempted to speak with pt via call to hospital room phone, however, unable to reach. RD unable to obtain further nutrition-related history or complete nutrition-focused physical exam at this time.    Pt remains on HFNC.  Per MD notes, pt now requesting miralax to help with BM's (noted previous refusals). No recorded BM since 08/08/22.   Pt  with poor oral intake, but has improved since meal completion documentation. Noted meal completions 50-100%. Diet has been liberalized to regular by MD. RD agrees with this change. Pt also drinking Ensure supplements.   Reviewed wt hx; pt has experienced a 8% wt loss over the  past month, which is significant for time frame. Per RN assessment, pt with deep pitting edema. Suspect this may be masking true weight loss as well as fat and muscle depletions.   Medications reviewed and include lasix and aldactone.   Labs reviewed: CBGS: 156 (inpatient orders for glycemic control are none).    Diet Order:   Diet Order             Diet regular Fluid consistency: Thin; Fluid restriction: 1200 mL Fluid  Diet effective now                   EDUCATION NEEDS:   No education needs have been identified at this time  Skin:  Skin Assessment: Reviewed RN Assessment  Last BM:  08/08/22  Height:   Ht Readings from Last 1 Encounters:  08/10/22 5\' 6"  (1.676 m)    Weight:   Wt Readings from Last 1 Encounters:  08/13/22 91.4 kg    Ideal Body Weight:  64.5 kg  BMI:  Body mass index is 32.52 kg/m.  Estimated Nutritional Needs:   Kcal:  1750-1950  Protein:  90-105 grams  Fluid:  1.5 L    Levada Schilling, RD, LDN, CDCES Registered Dietitian II Certified Diabetes Care and Education Specialist Please refer to [facility] for RD and/or RD on-call/weekend/after hours pager

## 2022-08-14 NOTE — Progress Notes (Signed)
Pt c/o of not sleeping despite receiving melatonin earlier in the shift. Admits to be anxious about prognosis and not knowing when he will get to go home. Expressed concerns about what going home will look like for him. States he has not slept since last night and is exhausted but unable to stop worrying about current situation. Wife and bedside attempt to support and console patient. On call provider notified. See new orders. Wife agrees patient would benefit from chaplain consult so I will place order.   Patient also state he would not like to get up or into the bed to be weighed at this time. If unable to obtain prior to shift ending will endorse to next shift.

## 2022-08-15 DIAGNOSIS — K7469 Other cirrhosis of liver: Secondary | ICD-10-CM | POA: Diagnosis not present

## 2022-08-15 DIAGNOSIS — I5033 Acute on chronic diastolic (congestive) heart failure: Secondary | ICD-10-CM | POA: Diagnosis not present

## 2022-08-15 DIAGNOSIS — K2970 Gastritis, unspecified, without bleeding: Secondary | ICD-10-CM | POA: Diagnosis not present

## 2022-08-15 DIAGNOSIS — J849 Interstitial pulmonary disease, unspecified: Secondary | ICD-10-CM | POA: Diagnosis not present

## 2022-08-15 LAB — BASIC METABOLIC PANEL
Anion gap: 7 (ref 5–15)
BUN: 17 mg/dL (ref 8–23)
CO2: 25 mmol/L (ref 22–32)
Calcium: 8.5 mg/dL — ABNORMAL LOW (ref 8.9–10.3)
Chloride: 104 mmol/L (ref 98–111)
Creatinine, Ser: 1.32 mg/dL — ABNORMAL HIGH (ref 0.61–1.24)
GFR, Estimated: 57 mL/min — ABNORMAL LOW (ref >60)
Glucose, Bld: 113 mg/dL — ABNORMAL HIGH (ref 70–99)
Potassium: 4.1 mmol/L (ref 3.5–5.1)
Sodium: 136 mmol/L (ref 135–145)

## 2022-08-15 NOTE — Plan of Care (Signed)
  Problem: Education: Goal: Ability to demonstrate management of disease process will improve Outcome: Progressing   Problem: Coping: Goal: Level of anxiety will decrease Outcome: Progressing

## 2022-08-15 NOTE — Plan of Care (Signed)
  Problem: Activity: Goal: Risk for activity intolerance will decrease Outcome: Progressing   Problem: Coping: Goal: Level of anxiety will decrease Outcome: Progressing   

## 2022-08-15 NOTE — Plan of Care (Signed)

## 2022-08-15 NOTE — Progress Notes (Signed)
   08/15/22 1635  Spiritual Encounters  Care provided to: Patient;Friend  Referral source Chaplain team  Reason for visit Routine spiritual support  OnCall Visit No  Spiritual Framework  Patient Stress Factors Health changes  Family Stress Factors None identified  Interventions  Spiritual Care Interventions Made Compassionate presence;Prayer   Pt stated that he was ready to go home. Asked me if I would pray for his healing. Friend that was present informed me that there are ants in the room and I notified the nurses station.

## 2022-08-15 NOTE — Progress Notes (Signed)
Progress Note   Patient: [name] [name]:[id number] DOB: April 27, 1950 DOA: 08/09/2022     5 DOS: the patient was seen and examined on 08/15/2022   Brief hospital course: Mr. [name] was admitted to the hospital with the working diagnosis of acute on chronic respiratory failure.   72 yo male with the past medical history of pulmonary fibrosis, chronic hypoxemic respiratory failure, cirrhosis due to [name], hypertension, hyperlipidemia, aortic stenosis sp ATVR, atrial fibrillation and heart failure who presented with dyspnea. Reported 3 days of worsening dyspnea and lower extremity edema. 15 lbs weight gain over last 2 months. On his initial physical examination his blood pressure was 137/57, HR 70, RR 25 and 02 saturation 97%, lungs with rales bilaterally, heart with S1 and S2 present and rhythmic, positive systolic murmur at the right lower sternal border, abdomen with no distention and positive lower extremity edema.   Chest radiograph with cardiomegaly, with bilateral hilar vascular congestion, no effusions. Sternotomy wires in place.   CT chest with bilateral ground glass opacities. Small right pleural effusion.   EKG 69 bpm, left axis deviation, left anterior fascicular block, right bundle branch block, qtc 508, sinus rhythm with PVC, no significant ST segment changes or T wave changes.   Patient was placed on furosemide for diuresis.   07/12 patient with high 02 requirements, heated high flow nasal cannula.  07/13 patient last night had anxiety, he did received lorazepam 0.25 mg IV Today 02 flow down to 20 L/min.   Assessment and Plan: * Acute on chronic diastolic CHF (congestive heart failure) ([name]) Echocardiogram with preserved LV systolic function with EF 50 to 55%, mild LVH of the basal segment. Hypokinesis of the apical segment. RV systolic function preserved. LA with moderate dilatation. SP [name]  Urine output is 2,850 ml Systolic blood pressure 120 to 126 mmHg.   Plan to  continue diuresis with IV furosemide. Continue spironolactone and [name] to augment diuresis.  Carvedilol with holding parameters due to bradycardia.   Acute on chronic hypoxemic respiratory failure.  Follow up chest radiograph with bilateral hilar vascular congestion with no new infiltrates (personally reviewed).  Patient continue with high 02 requirements, but able to wean to 20 L from 30 L/min.  At home he uses up to 5 L/min.   ILD (interstitial lung disease) ([name]) No clinical signs of acute exacerbation. Plan to continue diuresis and supplemental 02 per [name].  CT chest with no pulmonary embolism, chronic subpleural reticulation and ground glass opacities.  Possible porto pulmonary hypertension.  Continue with pirfenidone.   Essential hypertension Patient with sinus bradycardia with occasional PVC. Reate 60 bpm,continue carvedilol with holding parameters.   Hypokalemia Stable renal function with serum cr at 1,18, with K at 4,1 and serum bicarbonate at 24.  Na 138. Mg 2,5   Continue aggressive diuresis. Follow up renal function in am.  Avoid hypotension or nephrotoxic medications.   PAF (paroxysmal atrial fibrillation) ([name]) Patient sinus rhythm with bradycardia rate 60 bpm, will continue with carvedilol with holding parameters. Sp watchman procedure, not on anticoagulation.  Coronary atherosclerosis No chest pain or sings of acute coronary syndrome.  Dyslipidemia, continue with statin therapy.  Hepatic cirrhosis ([name]) No signs of decompensation, Positive esophageal varices, I suspect he may have porto pulmonary hypertension.   Will liberate his diet, but will continue with fluid restriction. Consult nutrition.  Add trazodone for sleep, avoid benzodiazepines,   Gastritis and gastroduodenitis Continue with pantoprazole.    Subjective: Chronically ill in appearance; high  flow nasal cannula supplementation in place.  Denies chest pain, no nausea, no vomiting.  Short  winded with activity and expressing mild orthopnea.  Physical Exam: Vitals:   08/15/22 1127 08/15/22 1404 08/15/22 1551 08/15/22 1748  BP: (!) 117/45  (!) 100/46   Pulse: (!) 59 (!) 58 (!) 58 (!) 59  Resp: 19 15 18 20   Temp: 98.3 F (36.8 C)  98.6 F (37 C)   TempSrc: Oral  Oral   SpO2: 96% 97% 96% 97%  Weight:      Height:       General exam: Alert, awake, oriented x 2; no use of accessory muscles; denying chest pain.  Breathing easier.  Still requiring-high flow nasal cannula Supplementation  Respiratory system: Positive rhonchi bilaterally; no using accessory muscle.  Decreased breath sounds at the bases.  Fine crackles appreciated. Cardiovascular system:RRR. No rubs or gallops; no JVD on exam. Gastrointestinal system: Abdomen is nondistended, soft and nontender.  Bowel sounds appreciated. Central nervous system: No focal neurological deficits. Extremities: No cyanosis or clubbing; 1-2+ edema appreciated bilaterally. Skin: No petechiae. Psychiatry: Slow to respond but appropriate; able to follow commands without any issues.  Stable mood.  Flat affect  Data Reviewed: The mid: Sodium 136, potassium 4.1, chloride 104, BUN 17, creatinine 1.32 and GFR 57   Family Communication: I spoke with patient's son at the bedside, we talked in detail about patient's condition, plan of care and prognosis and all questions were addressed.   Disposition: Status is: Inpatient  Remains inpatient appropriate because: heart failure with volume overload, continue IV diuresis.    Planned Discharge Destination: Home   Author: Vassie Loll, MD 08/15/2022 5:57 PM  For on call review [email or web address].

## 2022-08-16 DIAGNOSIS — K2970 Gastritis, unspecified, without bleeding: Secondary | ICD-10-CM | POA: Diagnosis not present

## 2022-08-16 DIAGNOSIS — I5033 Acute on chronic diastolic (congestive) heart failure: Secondary | ICD-10-CM | POA: Diagnosis not present

## 2022-08-16 DIAGNOSIS — K7469 Other cirrhosis of liver: Secondary | ICD-10-CM | POA: Diagnosis not present

## 2022-08-16 DIAGNOSIS — J849 Interstitial pulmonary disease, unspecified: Secondary | ICD-10-CM | POA: Diagnosis not present

## 2022-08-16 LAB — BASIC METABOLIC PANEL
Anion gap: 9 (ref 5–15)
BUN: 20 mg/dL (ref 8–23)
CO2: 26 mmol/L (ref 22–32)
Calcium: 8.6 mg/dL — ABNORMAL LOW (ref 8.9–10.3)
Chloride: 102 mmol/L (ref 98–111)
Creatinine, Ser: 1.28 mg/dL — ABNORMAL HIGH (ref 0.61–1.24)
GFR, Estimated: 59 mL/min — ABNORMAL LOW (ref >60)
Glucose, Bld: 149 mg/dL — ABNORMAL HIGH (ref 70–99)
Potassium: 3.9 mmol/L (ref 3.5–5.1)
Sodium: 137 mmol/L (ref 135–145)

## 2022-08-16 MED ORDER — METHYLPREDNISOLONE SODIUM SUCC 125 MG IJ SOLR
80.0000 mg | Freq: Every day | INTRAMUSCULAR | Status: DC
  Administered 2022-08-16 – 2022-08-19 (×4): 80 mg via INTRAVENOUS
  Filled 2022-08-16 (×4): qty 2

## 2022-08-16 NOTE — Progress Notes (Signed)
Progress Note   Patient: [name] ZOX:[id number] DOB: 1950/02/22 DOA: 08/09/2022     6 DOS: the patient was seen and examined on 08/16/2022   Brief hospital course: [name] was admitted to the hospital with the working diagnosis of acute on chronic respiratory failure.   72 yo male with the past medical history of pulmonary fibrosis, chronic hypoxemic respiratory failure, cirrhosis due to [facility], hypertension, hyperlipidemia, aortic stenosis sp ATVR, atrial fibrillation and heart failure who presented with dyspnea. Reported 3 days of worsening dyspnea and lower extremity edema. 15 lbs weight gain over last 2 months. On his initial physical examination his blood pressure was 137/57, HR 70, RR 25 and 02 saturation 97%, lungs with rales bilaterally, heart with S1 and S2 present and rhythmic, positive systolic murmur at the right lower sternal border, abdomen with no distention and positive lower extremity edema.   Chest radiograph with cardiomegaly, with bilateral hilar vascular congestion, no effusions. Sternotomy wires in place.   CT chest with bilateral ground glass opacities. Small right pleural effusion.   EKG 69 bpm, left axis deviation, left anterior fascicular block, right bundle branch block, qtc 508, sinus rhythm with PVC, no significant ST segment changes or T wave changes.   Patient was placed on furosemide for diuresis.   07/12 patient with high 02 requirements, heated high flow nasal cannula.  07/13 patient last night had anxiety, he did received lorazepam 0.25 mg IV Today 02 flow down to 20 L/min.   Assessment and Plan: * Acute on chronic diastolic CHF (congestive heart failure) ([facility]) Echocardiogram with preserved LV systolic function with EF 50 to 55%, mild LVH of the basal segment. Hypokinesis of the apical segment. RV systolic function preserved. LA with moderate dilatation. SP TAVR  Urine output is 2,850 ml Systolic blood pressure 120 to 126 mmHg.   Plan to  continue diuresis with IV furosemide. Continue spironolactone and SGLT 2 inh to augment diuresis.  Carvedilol with holding parameters due to bradycardia.   Acute on chronic hypoxemic respiratory failure.  Follow up chest radiograph with bilateral hilar vascular congestion with no new infiltrates (personally reviewed).  Patient continue with high 02 requirements, but able to wean to 20 L from 30 L/min.  At home he uses up to 5 L/min.   ILD (interstitial lung disease) (H[facility]) No clinical signs of acute exacerbation. Plan to continue diuresis and supplemental 02 per [place].  CT chest with no pulmonary embolism, chronic subpleural reticulation and ground glass opacities.  Possible porto pulmonary hypertension.  Continue with pirfenidone.   Essential hypertension Patient with sinus bradycardia with occasional PVC. Reate 60 bpm,continue carvedilol with holding parameters.   Hypokalemia Stable renal function with serum cr at 1,18, with K at 4,1 and serum bicarbonate at 24.  Na 138. Mg 2,5   Continue aggressive diuresis. Follow up renal function in am.  Avoid hypotension or nephrotoxic medications.   PAF (paroxysmal atrial fibrillation) (HCC) Patient sinus rhythm with bradycardia rate 60 bpm, will continue with carvedilol with holding parameters. Sp watchman procedure, not on anticoagulation.  Coronary atherosclerosis No chest pain or sings of acute coronary syndrome.  Dyslipidemia, continue with statin therapy.  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   Will liberate his diet, but will continue with fluid restriction. Consult nutrition.  Add trazodone for sleep, avoid benzodiazepines,   Gastritis and gastroduodenitis Continue with pantoprazole.    Subjective: Chronically ill in appearance; high  flow nasal cannula supplementation in place; no chest pain, no nausea or vomiting.  Reports  feeling weak, tired and short winded with minimal activity.  Mild orthopnea also expressed.  Physical Exam: Vitals:   08/16/22 0737 08/16/22 1107 08/16/22 1437 08/16/22 1608  BP: (!) 118/49 (!) 114/52    Pulse: 62 (!) 59 (!) 56 61  Resp: 18 14 19    Temp: 98.6 F (37 C) 98.4 F (36.9 C)    TempSrc: Oral Oral    SpO2: 95% 93% 95%   Weight:      Height:       General exam: Alert, awake, oriented x 2; in no acute distress; reporting still feeling short winded with minimal activity and demonstrating desaturation when attempting to decrease oxygen supplementation.  Currently high flow nasal cannula using 25-30 L.   Respiratory system: Mild expiratory wheezing, positive rhonchi; decreased breath sounds at the bases.  No using accessory muscle.  Normal respiratory effort appreciated at rest. Cardiovascular system:RRR. No murmurs, rubs, gallops. Gastrointestinal system: Abdomen is nondistended, soft and nontender. No organomegaly or masses felt. Normal bowel sounds heard. Central nervous system: Alert and oriented. No focal neurological deficits. Extremities: No cyanosis or clubbing; 1-2+ edema appreciated bilaterally. Skin: No petechiae. Psychiatry: Flat affect appreciated.  Data Reviewed: BMET:Sodium 137, potassium 3.9, chloride 102, bicarb 26, BUN 20, creatinine 1.28 and GFR 59  Family Communication: I spoke with patient's son at the bedside, we talked in detail about patient's condition, plan of care and prognosis and all questions were addressed.   Disposition: Status is: Inpatient  Remains inpatient appropriate because: heart failure with volume overload, continue IV diuresis and IV steroids.    Planned Discharge Destination: Home   Author: Vassie Loll, MD 08/16/2022 4:20 PM  For on call review [email or web address].

## 2022-08-16 NOTE — Progress Notes (Signed)
Physical Therapy Treatment Patient Details Name: [name] MRN: [id number] DOB: Sep 30, 1950 Today's Date: 08/16/2022   History of Present Illness Pt is a 72 year old man admitted on 7/8 with shortness of breath and LE swelling. Significant Resp distress with heated HFNC.  PMH: pulmonary fibrosis on 5L O2, HTN, HLD, CHF, CAD s/p CABG, aortic stenosis s/p TAVR, afib s/p Watchman, Cirrhosis.    PT Comments  Pt admitted with above diagnosis. Pt with continued issues with poor endurance for activity. Pt VS at rest were 92% on 73% FiO2 and 30L/min HHFNC.  HR 56 bpm, 114/52.  Pt tolerated walking 12 feet and needed sitting rest break as he desaturates to 82%.  Takes 3 min to return to 88% and above. Pt able to ambulate 12 feet again, desaturated and needing sitting rest agin. Performed some LE exercises as well.  Pt currently with functional limitations due to the deficits listed below (see PT Problem List). Pt will benefit from acute skilled PT to increase their independence and safety with mobility to allow discharge.        Assistance Recommended at Discharge Intermittent Supervision/Assistance  If plan is discharge home, recommend the following:  Can travel by private vehicle    A little help with walking and/or transfers;A little help with bathing/dressing/bathroom;Assistance with cooking/housework;Assist for transportation;Help with stairs or ramp for entrance      Equipment Recommendations  Rollator (4 wheels);Wheelchair (measurements PT);Wheelchair cushion (measurements PT);BSC/3in1;Other (comment) (Wedge for bed, possible shower chair, oxygen holder on rollator)    Recommendations for Other Services       Precautions / Restrictions Precautions Precautions: Fall Precaution Comments: on HHFNC Restrictions Weight Bearing Restrictions: No     Mobility  Bed Mobility Overal bed mobility: Modified Independent             General bed mobility comments: rolled with rail.   Desaturates wtih each transition.    Transfers Overall transfer level: Needs assistance Equipment used: 1 person hand held assist, None Transfers: Sit to/from Stand Sit to Stand: Min guard           General transfer comment: Pt stood to his feet holding onto PT.  Maintains flexed posture at trunk and hips/knees which friend states is premorbid.   Pt sats while sitting 87% and >.    Ambulation/Gait Ambulation/Gait assistance: Min guard Gait Distance (Feet): 12 Feet (12 feet x 2) Assistive device: 1 person hand held assist, None Gait Pattern/deviations: Step-through pattern, Decreased stride length, Knee flexed in stance - right, Knee flexed in stance - left, Shuffle, Drifts right/left, Trunk flexed, Wide base of support   Gait velocity interpretation: <1.31 ft/sec, indicative of household ambulator   General Gait Details: once in standing, pt did laps (length of how far the heated HFNC allowed)  prior to O2 desaturating and then allowed pt to sit down and rest. Took about 3 min for pt to return to 88% and above.  Transferred pt to chair to sit up a bit.  Pt very fatigued. After transfer, again saturations took about 4 min to return to 88% and >.  Nurse aware.  Of note, pt reaching for chairs/sink when ambulating as he sseemed to feel unsteady at times.   Stairs             Wheelchair Mobility     Tilt Bed    Modified Rankin (Stroke Patients Only)       Balance  Cognition Arousal/Alertness: Lethargic Behavior During Therapy: Flat affect Overall Cognitive Status: Within Functional Limits for tasks assessed                                 General Comments: very tired from not sleeping well previous night and days        Exercises General Exercises - Lower Extremity Ankle Circles/Pumps: AROM, Both, 5 reps, Supine Long Arc Quad: AROM, Both, 10 reps, Seated Heel Slides: AROM, Both, 5  reps, Supine    General Comments General comments (skin integrity, edema, etc.): HHFNC 73% FiO2 with 30L/Min.  114/52 with HR 56 bpm      Pertinent Vitals/Pain Pain Assessment Pain Assessment: No/denies pain    Home Living                          Prior Function            PT Goals (current goals can now be found in the care plan section) Acute Rehab PT Goals Patient Stated Goal: to go home Progress towards PT goals: Progressing toward goals    Frequency    Min 1X/week      PT Plan Current plan remains appropriate    Co-evaluation              AM-PAC PT "6 Clicks" Mobility   Outcome Measure  Help needed turning from your back to your side while in a flat bed without using bedrails?: None Help needed moving from lying on your back to sitting on the side of a flat bed without using bedrails?: A Little Help needed moving to and from a bed to a chair (including a wheelchair)?: A Little Help needed standing up from a chair using your arms (e.g., wheelchair or bedside chair)?: A Little Help needed to walk in hospital room?: A Little Help needed climbing 3-5 steps with a railing? : A Lot 6 Click Score: 18    End of Session Equipment Utilized During Treatment: Gait belt;Oxygen Activity Tolerance: Patient limited by fatigue Patient left: with call bell/phone within reach;with family/visitor present;in chair;with chair alarm set Nurse Communication: Mobility status PT Visit Diagnosis: Muscle weakness (generalized) (M62.81);Unsteadiness on feet (R26.81)     Time: 4098-1191 PT Time Calculation (min) (ACUTE ONLY): 27 min  Charges:    $Gait Training: 8-22 mins $Therapeutic Exercise: 8-22 mins PT General Charges $$ ACUTE PT VISIT: 1 Visit                     Yakub Lodes M,PT Acute Rehab Services [phone]    [name] 08/16/2022, 2:56 PM

## 2022-08-16 NOTE — Progress Notes (Signed)
   Heart Failure Stewardship Pharmacist Progress Note   PCP: Philip Aspen, Limmie Patricia, MD PCP-Cardiologist: Olga Millers, MD    HPI:  72 yo M with PMH of HTN, HLD, CHF, CAD s/p CABG, aortic stenosis s/p TAVR, afib s/p Watchman, pulmonary fibrosis, ILD, chronic respiratory failure on 5L O2 at baseline, and cirrhosis.   Presented to the ED on 7/8 with shortness of breath, weight gain, and LE edema. Was hypoxic in the 80s on 6L O2 and was placed on nonrebreather at 15L. BNP elevated to 1031. CXR showed cardiomegaly without overt edema and atelectasis vs PNA. CTA negative for PE. Last ECHO from 06/2022 showed LVEF 50-55%, regional wall motion abnormalities, mild LVH, RV normal. Amyloid screen was negative 05/2022.  Patient was transitioned from furosemide to torsemide as outpatient on 04/23/22. He has been getting both medications refilled at the pharmacy. His wife states he has been alternating torsemide and furosemide based on his response to the diuretic.  Current HF Medications: Diuretic: furosemide 80 mg IV BID Beta Blocker: carvedilol 3.125 mg BID MRA: spironolactone 25 mg daily SGLT2i: Jardiance 10 mg daily  Prior to admission HF Medications: Diuretic: furosemide 80 mg daily AND torsemide 60 mg daily Beta blocker: carvedilol 3.125 mg BID SGLT2i: Jardiance 10 mg daily  Pertinent Lab Values: Serum creatinine 1.28, BUN 20, Potassium 3.9, Sodium 137, BNP 1031.6, Magnesium 2.5  Vital Signs: Weight: 202 lbs (admission weight: 212 lbs) Blood pressure: 110/50s  Heart rate: 50-60s  I/O: net -0.2L yesterday; net -13.6L since admission  Medication Assistance / Insurance Benefits Check: Does the patient have prescription insurance?  Yes Type of insurance plan: [facility] Medicare  Outpatient Pharmacy:  Prior to admission outpatient pharmacy: [facility] Is the patient willing to use [facility] TOC pharmacy at discharge? Yes Is the patient willing to transition their outpatient pharmacy to utilize a [facility] outpatient pharmacy?   Patient is interested in the mail order option. He would like to consider this more and then will let me know when he decides.     Assessment: 1. Acute on chronic diastolic CHF (LVEF 50-55%). NYHA class III symptoms. - Continue furosemide 80 mg IV BID. Good urine output so far. LE edema improving, still with edema. Strict I/Os and daily weights. Keep K>4 and Mg>2. Will need to discontinue the furosemide prescription at discharge. - Continue carvedilol 3.125 mg BID - Continue spironolactone 25 mg daily - Continue Jardiance 10 mg daily   Plan: 1) Medication changes recommended at this time: - Continue IV diuresis - Stop PO furosemide on discharge  2) Patient assistance: - None pending  3)  Education  - Patient has been educated on current HF medications and potential additions to HF medication regimen - Patient verbalizes understanding that over the next few months, these medication doses may change and more medications may be added to optimize HF regimen - Patient has been educated on basic disease state pathophysiology and goals of therapy   Sharen Hones, PharmD, BCPS Heart Failure Stewardship Pharmacist Phone [phone]

## 2022-08-17 ENCOUNTER — Encounter (HOSPITAL_COMMUNITY): Payer: Self-pay | Admitting: Emergency Medicine

## 2022-08-17 ENCOUNTER — Other Ambulatory Visit: Payer: Self-pay | Admitting: Cardiology

## 2022-08-17 ENCOUNTER — Encounter (HOSPITAL_COMMUNITY): Payer: 59

## 2022-08-17 ENCOUNTER — Ambulatory Visit (HOSPITAL_COMMUNITY)
Admission: RE | Admit: 2022-08-17 | Discharge: 2022-08-17 | Disposition: A | Discharge status: Home / Self Care | Payer: 59 | Source: Ambulatory Visit | Attending: Gastroenterology | Admitting: Gastroenterology

## 2022-08-17 ENCOUNTER — Telehealth (HOSPITAL_COMMUNITY): Payer: Self-pay | Admitting: Emergency Medicine

## 2022-08-17 DIAGNOSIS — J849 Interstitial pulmonary disease, unspecified: Secondary | ICD-10-CM | POA: Diagnosis not present

## 2022-08-17 DIAGNOSIS — J9601 Acute respiratory failure with hypoxia: Secondary | ICD-10-CM

## 2022-08-17 DIAGNOSIS — I5042 Chronic combined systolic (congestive) and diastolic (congestive) heart failure: Secondary | ICD-10-CM

## 2022-08-17 DIAGNOSIS — K7469 Other cirrhosis of liver: Secondary | ICD-10-CM | POA: Diagnosis not present

## 2022-08-17 DIAGNOSIS — Z515 Encounter for palliative care: Secondary | ICD-10-CM

## 2022-08-17 DIAGNOSIS — I5033 Acute on chronic diastolic (congestive) heart failure: Secondary | ICD-10-CM | POA: Diagnosis not present

## 2022-08-17 DIAGNOSIS — I5032 Chronic diastolic (congestive) heart failure: Secondary | ICD-10-CM

## 2022-08-17 DIAGNOSIS — K2970 Gastritis, unspecified, without bleeding: Secondary | ICD-10-CM | POA: Diagnosis not present

## 2022-08-17 NOTE — Progress Notes (Signed)
Progress Note   Patient: [name] ZOX:[id number] DOB: 10/25/1950 DOA: 08/09/2022     7 DOS: the patient was seen and examined on 08/17/2022   Brief hospital course: Mr. Longton was admitted to the hospital with the working diagnosis of acute on chronic respiratory failure.   72 yo male with the past medical history of pulmonary fibrosis, chronic hypoxemic respiratory failure, cirrhosis due to [facility], hypertension, hyperlipidemia, aortic stenosis sp ATVR, atrial fibrillation and heart failure who presented with dyspnea. Reported 3 days of worsening dyspnea and lower extremity edema. 15 lbs weight gain over last 2 months. On his initial physical examination his blood pressure was 137/57, HR 70, RR 25 and 02 saturation 97%, lungs with rales bilaterally, heart with S1 and S2 present and rhythmic, positive systolic murmur at the right lower sternal border, abdomen with no distention and positive lower extremity edema.   Chest radiograph with cardiomegaly, with bilateral hilar vascular congestion, no effusions. Sternotomy wires in place.   CT chest with bilateral ground glass opacities. Small right pleural effusion.   EKG 69 bpm, left axis deviation, left anterior fascicular block, right bundle branch block, qtc 508, sinus rhythm with PVC, no significant ST segment changes or T wave changes.   Patient was placed on furosemide for diuresis.   07/12 patient with high 02 requirements, heated high flow nasal cannula.  07/13 patient last night had anxiety, he did received lorazepam 0.25 mg IV Today 02 flow down to 20 L/min.   Assessment and Plan: * Acute on chronic diastolic CHF (congestive heart failure) (HCC) Echocardiogram with preserved LV systolic function with EF 50 to 55%, mild LVH of the basal segment. Hypokinesis of the apical segment. RV systolic function preserved. LA with moderate dilatation. SP TAVR  Urine output is 2,850 ml Systolic blood pressure 120 to 126 mmHg.   Plan to  continue diuresis with IV furosemide. Continue spironolactone and SGLT 2 inh to augment diuresis.  Carvedilol with holding parameters due to bradycardia.   Acute on chronic hypoxemic respiratory failure.  Follow up chest radiograph with bilateral hilar vascular congestion with no new infiltrates (personally reviewed).  Patient continue with high 02 requirements, but able to wean to 20 L from 30 L/min.  At home he uses up to 5 L/min.   ILD (interstitial lung disease) (HCC) With concerns for mild acute exacerbation. Plan to continue diuresis and wean off oxygen supplementation as tolerated -Since initiation of steroids patient oxygen level down to 8 L high flow nasal cannula supplementation (requiring 25-30 L up to 08/16/2022). -CT chest with no pulmonary embolism, chronic subpleural reticulation and ground glass opacities.  -Continue with pirfenidone.   Essential hypertension Patient with sinus bradycardia with occasional PVC. Reate 60 bpm,continue carvedilol with holding parameters.   Hypokalemia Stable renal function with serum cr at 1,18, with K at 4,1 and serum bicarbonate at 24.  Na 138. Mg 2,5   Continue aggressive diuresis. Follow up renal function in am.  Avoid hypotension or nephrotoxic medications.   PAF (paroxysmal atrial fibrillation) (HCC) Patient sinus rhythm with bradycardia rate 60 bpm, will continue with carvedilol with holding parameters. Sp watchman procedure, not on anticoagulation.  Coronary atherosclerosis No chest pain or sings of acute coronary syndrome.  Dyslipidemia, continue with statin therapy.  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   Will liberate his diet, but will continue with fluid restriction. Consult nutrition.  Add trazodone for sleep, avoid  benzodiazepines,   Gastritis and gastroduodenitis Continue with pantoprazole.    Subjective:  Chronically ill in appearance; high flow nasal cannula supplementation in place; no chest pain, no nausea or vomiting.  Reports feeling weak, tired and short winded with minimal activity.  Mild orthopnea also expressed.  Physical Exam: Vitals:   08/17/22 1127 08/17/22 1140 08/17/22 1145 08/17/22 1200  BP: (!) 117/51 (!) 118/44  (!) 115/44  Pulse:    (!) 59  Resp:    (!) 21  Temp:    (!) 97.5 F (36.4 C)  TempSrc:    Axillary  SpO2:   97% 95%  Weight:      Height:       General exam: Alert, awake, oriented x 3; feeling better and breathing easier.  Generally weak.  Requiring 8 L high flow nasal cannula supplementation at time of examination. Respiratory system: Improved air movement bilaterally; decreased breath sounds at the bases, positive rhonchi and no wheezing on today's examination.  8 L high flow nasal cannula supplementation in place. Cardiovascular system: Rate controlled; no rubs or gallops. Gastrointestinal system: Abdomen is nondistended, soft and nontender. No organomegaly or masses felt. Normal bowel sounds heard. Central nervous system: Generally weak, no focal neurological deficits. Extremities: No cyanosis or clubbing; 1+ edema appreciated bilaterally. Skin: No petechiae. Psychiatry: Judgement and insight appear stable on today's examination; flat affect appreciated.  No suicidal ideation or hallucinations.  Latest Data Reviewed: BMET:Sodium 137, potassium 3.9, chloride 102, bicarb 26, BUN 20, creatinine 1.28 and GFR 59  Family Communication: I spoke with patient's son at the bedside, we talked in detail about patient's condition, plan of care and prognosis and all questions were addressed.   Disposition: Status is: Inpatient  Remains inpatient appropriate because: heart failure with volume overload, continue IV diuresis and IV steroids.    Planned Discharge Destination: Home with home health services.   Author: Vassie Loll, MD 08/17/2022 3:30 PM  For on  call review [email or web address].

## 2022-08-17 NOTE — Progress Notes (Signed)
   08/17/22 2257  BiPAP/CPAP/SIPAP  Reason BIPAP/CPAP not in use Non-compliant   Refused

## 2022-08-17 NOTE — Progress Notes (Signed)
   Heart Failure Stewardship Pharmacist Progress Note   PCP: Philip Aspen, Limmie Patricia, MD PCP-Cardiologist: Olga Millers, MD    HPI:  72 yo M with PMH of HTN, HLD, CHF, CAD s/p CABG, aortic stenosis s/p TAVR, afib s/p Watchman, pulmonary fibrosis, ILD, chronic respiratory failure on 5L O2 at baseline, and cirrhosis.   Presented to the ED on 7/8 with shortness of breath, weight gain, and LE edema. Was hypoxic in the 80s on 6L O2 and was placed on nonrebreather at 15L. BNP elevated to 1031. CXR showed cardiomegaly without overt edema and atelectasis vs PNA. CTA negative for PE. Last ECHO from 06/2022 showed LVEF 50-55%, regional wall motion abnormalities, mild LVH, RV normal. Amyloid screen was negative 05/2022.  Patient was transitioned from furosemide to torsemide as outpatient on 04/23/22. He has been getting both medications refilled at the pharmacy. His wife states he has been alternating torsemide and furosemide based on his response to the diuretic.  Current HF Medications: Diuretic: furosemide 80 mg IV BID Beta Blocker: carvedilol 3.125 mg BID MRA: spironolactone 25 mg daily SGLT2i: Jardiance 10 mg daily  Prior to admission HF Medications: Diuretic: furosemide 80 mg daily AND torsemide 60 mg daily Beta blocker: carvedilol 3.125 mg BID SGLT2i: Jardiance 10 mg daily  Pertinent Lab Values: As of 7/15: Serum creatinine 1.28, BUN 20, Potassium 3.9, Sodium 137, BNP 1031.6, Magnesium 2.5  Vital Signs: Weight: 196 lbs (admission weight: 212 lbs) Blood pressure: 110/50s  Heart rate: 50-60s  I/O: net -0.7L yesterday; net -15.1L since admission  Medication Assistance / Insurance Benefits Check: Does the patient have prescription insurance?  Yes Type of insurance plan: [facility] Medicare  Outpatient Pharmacy:  [facility] Is the patient willing to use [facility] TOC pharmacy at discharge? Yes Is the patient willing to transition their outpatient pharmacy to  utilize a [facility] outpatient pharmacy?   Patient is interested in the mail order option. He would like to consider this more and then will let me know when he decides.     Assessment: 1. Acute on chronic diastolic CHF (LVEF 50-55%). NYHA class III symptoms. - Continue furosemide 80 mg IV BID. Good urine output so far. LE edema improving, still with edema. Strict I/Os and daily weights. Keep K>4 and Mg>2. Will need to discontinue the furosemide prescription at discharge. - Continue carvedilol 3.125 mg BID - Continue spironolactone 25 mg daily - Continue Jardiance 10 mg daily   Plan: 1) Medication changes recommended at this time: - Continue IV diuresis - Stop PO furosemide on discharge  2) Patient assistance: - None pending  3)  Education  - Patient has been educated on current HF medications and potential additions to HF medication regimen - Patient verbalizes understanding that over the next few months, these medication doses may change and more medications may be added to optimize HF regimen - Patient has been educated on basic disease state pathophysiology and goals of therapy   Sharen Hones, PharmD, BCPS Heart Failure Stewardship Pharmacist Phone [phone]

## 2022-08-17 NOTE — Telephone Encounter (Signed)
Spoke with patient and his wife.  Pt reports possible discharge home from [facility] on Thursday or Friday.  Instructed to bring return to Rehab class from PCP.

## 2022-08-17 NOTE — TOC Progression Note (Addendum)
Transition of Care [facility]) - Progression Note    Patient Details  Name: [name] MRN: [id number] Date of Birth: 24-Mar-1950  Transition of Care [facility]) CM/SW Contact  Leone Haven, RN Phone Number: 08/17/2022, 2:54 PM  Clinical Narrative:    Patient conts on 20 liters of oxygen, will see if he may possibly be a candidate for ltach.  NCM spoke with patient and wife regarding ltach , he is in agreement with trying to see if his insurance will approve  for ltach.    Expected Discharge Plan: Home w Home Health Services Barriers to Discharge: Continued Medical Work up  Expected Discharge Plan and Services In-house Referral: NA Discharge Planning Services: CM Consult Post Acute Care Choice: NA Living arrangements for the past 2 months: Single Family Home                 DME Arranged: N/A DME Agency: NA       HH Arranged: NA           Social Determinants of Health (SDOH) Interventions SDOH Screenings   Food Insecurity: No Food Insecurity (12/17/2021)  Housing: Low Risk  (08/11/2022)  Transportation Needs: No Transportation Needs (08/11/2022)  Utilities: Not At Risk (12/17/2021)  Alcohol Screen: Low Risk  (08/11/2022)  Depression (PHQ2-9): Low Risk  (06/10/2022)  Financial Resource Strain: Low Risk  (08/11/2022)  Tobacco Use: Low Risk  (08/09/2022)    Readmission Risk Interventions    12/18/2021   11:14 AM  Readmission Risk Prevention Plan  Post Dischage Appt Complete  Medication Screening Complete  Transportation Screening Complete

## 2022-08-17 NOTE — Progress Notes (Signed)
Pt weaned off heated high flow nasal cannula at this time. Pt placed on salter high flow nasal cannula at 8L without complication. RT will continue to monitor and be available as needed.

## 2022-08-17 NOTE — Consult Note (Addendum)
Consultation Note Date: 08/17/2022   Patient Name: [name]  DOB: 22-Jul-1950  MRN: [id number]  Age / Sex: 72 y.o., male  PCP: Philip Aspen, Limmie Patricia, MD Referring Physician: Vassie Loll, MD  Reason for Consultation: Establishing goals of care  HPI/Patient Profile: 72 y.o. male  admitted on 08/09/2022 with  past medical history of pulmonary fibrosis, chronic hypoxemic respiratory failure, cirrhosis due to [facility], hypertension, hyperlipidemia, aortic stenosis sp ATVR, atrial fibrillation and heart failure who presented with dyspnea. Reported 3 days of worsening dyspnea and lower extremity edema. 15 lbs weight gain over last 2 months. On his initial physical examination his blood pressure was 137/57, HR 70, RR 25 and 02 saturation 97%, lungs with rales bilaterally, heart with S1 and S2 present and rhythmic, positive systolic murmur at the right lower sternal border, abdomen with no distention and positive lower extremity edema.    Chest radiograph with cardiomegaly, with bilateral hilar vascular congestion, no effusions. Sternotomy wires in place.    CT chest with bilateral ground glass opacities. Small right pleural effusion.   08-13-2022 patient with high O2 requirements, heated high flow nasal cannula initiated 08/14/2022 increased anxiety, utilize lorazepam, able to decrease opioids at 20 L/min 7-16 2024-weaned off heated high flow patient with 8 L nasal cannula   Patient and his family face treatment option decisions, advanced directive decisions and anticipatory care needs.  Clinical Assessment and Goals of Care:  This NP Lorinda Creed reviewed medical records, received report from team, assessed the patient and then meet at the patient's bedside with patient's wife to discuss diagnosis, prognosis, GOC, EOL wishes disposition and options.   Concept of Palliative Care was introduced as specialized  medical care for people and their families living with serious illness.  If focuses on providing relief from the symptoms and stress of a serious illness.  The goal is to improve quality of life for both the patient and the family.  Values and goals of care important to patient and family were attempted to be elicited.  Patient's wife very familiar with as she worked for a [employer] in [place] for many years   Created space and opportunity for patient  and family to explore thoughts and feelings regarding current medical situation.  He and his  wife are able to verbalize an understanding  of the seriousness of his disease.   Patient  wonders if his   situation is end of life.    Education offered on his multiple co-morbidities; CHF, interstitial lung disease, current weakness and fatigue, hepatic cirrhosis.   Education offered on prognostication in patient's with interstitial lung.  Trajectory is greatly affected by a person desire for life prolonging measures.  Discussed medical management of ILD.   Ultimately patient wishes to be at home.     A  discussion was had today regarding advanced directives.  Concepts specific to code status, artifical feeding and hydration, continued IV antibiotics and rehospitalization was had.  The difference between a aggressive medical intervention path  and a palliative comfort care path for this patient at this time was had.     MOST form Introduced and Hard Choices booklet left for review.    Natural trajectory and expectations at EOL were discussed.  Questions and concerns addressed.  Patient  encouraged to call with questions or concerns.     PMT will continue to support holistically.             NEXT OF KIN/wife--- no documented H POA or advanced care planning documents offered support from spiritual care for help in completion       SUMMARY OF RECOMMENDATIONS    Code Status/Advance Care Planning: DNR   Symptom Management:   Education offered on utilization of low-dose opioids dyspnea    In patients with serious lung disease for dyspnea  Palliative Prophylaxis:  Aspiration, Bowel Regimen, Delirium Protocol, Frequent Pain Assessment, and Oral Care  Additional Recommendations (Limitations, Scope, Preferences): Full Scope Treatment  Psycho-social/Spiritual:  Desire for further Chaplaincy support:no Additional Recommendations: Education on Hospice  Prognosis:  Unable to determine  Discharge Planning: To Be Determined      Primary Diagnoses: Present on Admission:  PAF (paroxysmal atrial fibrillation) (HCC)  ILD (interstitial lung disease) (HCC)  Hepatic cirrhosis (HCC)  Essential hypertension  Coronary atherosclerosis  Gastritis and gastroduodenitis   I have reviewed the medical record, interviewed the patient and family, and examined the patient. The following aspects are pertinent.  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 Excell Seltzer and Dr. Leafy Ro   Sleep apnea    has a cpap - not using religiously   Social History   Socioeconomic History   Marital status: Significant Other    Spouse name: Rometta Emery   Number of children: 0   Years of education: Not on file   Highest education level: High school  graduate  Occupational History   Occupation: Barrister's clerk  Tobacco Use   Smoking status: Never    Passive exposure: Never   Smokeless tobacco: Never  Vaping Use   Vaping status: Never Used  Substance and Sexual Activity   Alcohol use: Not Currently    Comment: occasional   Drug use: No   Sexual activity: Not on file  Other Topics Concern   Not on file  Social History Narrative   Not on file   Social Determinants of Health   Financial Resource Strain: Low Risk  (08/11/2022)   Overall Financial Resource Strain (CARDIA)    Difficulty of Paying Living Expenses: Not hard at all  Food Insecurity: No Food Insecurity (12/17/2021)   Hunger Vital Sign    Worried About Running Out of Food in the Last Year: Never true    Ran Out of Food in the Last Year: Never true  Transportation Needs: No Transportation Needs (08/11/2022)   PRAPARE - Administrator, Civil Service (Medical): No    Lack of Transportation (Non-Medical): No  Physical Activity: Not on file  Stress: Not on file  Social Connections: Not on file   Family History  Problem Relation Age of Onset   Hypertension Mother    Hyperlipidemia Mother    Heart disease Father    Ulcers Father    Bipolar disorder Brother    Colon cancer Neg Hx    Colon polyps Neg Hx    Esophageal cancer Neg Hx    Rectal cancer Neg Hx    Stomach cancer Neg Hx    Scheduled Meds:  (feeding supplement) PROSource Plus  30 mL Oral TID BM   aspirin EC  81 mg Oral Daily   atorvastatin  80 mg Oral Daily   carvedilol  3.125 mg Oral BID WC   empagliflozin  10 mg Oral Daily   feeding supplement  237 mL Oral BID BM   furosemide  80 mg Intravenous BID   methylPREDNISolone (SOLU-MEDROL) injection  80 mg Intravenous Daily   multivitamin with minerals  1 tablet Oral Daily   pantoprazole  40 mg Oral Daily   Pirfenidone  801 mg Oral TID WC   sodium chloride flush  3 mL Intravenous Q12H   spironolactone  25 mg Oral Daily    tamsulosin  0.4 mg Oral Daily   traZODone  50 mg Oral QHS   Continuous Infusions: PRN Meds:.alum & mag hydroxide-simeth, melatonin, ondansetron (ZOFRAN) IV, polyethylene glycol Medications Prior to Admission:  Prior to Admission medications   Medication Sig Start Date End Date Taking? Authorizing Provider  acetaminophen (TYLENOL) 500 MG tablet Take 1,000 mg by mouth daily as needed for moderate pain or headache.   Yes [provider]  atorvastatin (LIPITOR) 80 MG tablet TAKE 1 TABLET BY MOUTH EVERY DAY Patient taking differently: Take 80 mg by mouth every evening. 04/26/22  Yes Philip Aspen, Limmie Patricia, MD  carvedilol (COREG) 3.125 MG tablet TAKE 1 TABLET BY MOUTH TWICE A DAY WITH FOOD 05/03/22  Yes Armbruster, Willaim Rayas, MD  [facility] VITAMIN B12 1000 MCG tablet TAKE 1 TABLET BY MOUTH EVERY DAY 01/23/22  Yes Jaci Standard, MD  furosemide (LASIX) 40 MG tablet Take 80 mg by mouth daily. 07/17/22  Yes [provider]  JARDIANCE 10 MG TABS tablet TAKE 1 TABLET BY MOUTH DAILY BEFORE BREAKFAST. 07/05/22  Yes Crenshaw, Madolyn Frieze, MD  KLOR-CON M20 20 MEQ tablet TAKE 1 TABLET BY MOUTH EVERY DAY 12/29/21  Yes Crenshaw, Madolyn Frieze, MD  Misc Natural Products (LIVER PROTECT PO) Take 1 capsule by mouth 2 (two) times daily. From [employer]   Yes [provider]  OXYGEN Inhale into the lungs.   Yes [provider]  Pirfenidone 267 MG TABS Take 3 tablets (801 mg total) by mouth 3 (three) times daily with meals. 05/18/22  Yes Mannam, Praveen, MD  tamsulosin (FLOMAX) 0.4 MG CAPS capsule TAKE ONE CAPSULE BY MOUTH EVERY DAY AFTER SUPPER Patient taking differently: Take 0.4 mg by mouth daily. 04/26/22  Yes Philip Aspen, Limmie Patricia, MD  torsemide 60 MG TABS Take 60 mg by mouth daily. If your weight increases to 205lb or more, take an extra 60mg  of torsemide. Patient taking differently: Take 60 mg by mouth daily. 05/11/22  Yes Croitoru, Mihai, MD  amoxicillin (AMOXIL) 500 MG tablet Take 4  tablets (2,000 mg total) by mouth  as directed. 1 HOUR PRIOR TO DENTAL APPOINTMENTS Patient not taking: Reported on 08/09/2022 03/29/22   Georgie Chard D, NP  aspirin EC 81 MG tablet Take 1 tablet (81 mg total) by mouth daily. Swallow whole. Patient not taking: Reported on 08/09/2022 03/22/22   Tereso Newcomer T, PA-C   No Known Allergies Review of Systems  Respiratory:  Positive for shortness of breath.   Neurological:  Positive for weakness.    Physical Exam Constitutional:      Appearance: He is normal weight. He is ill-appearing.     Interventions: Nasal cannula in place.     Comments: Generalized weakness  Cardiovascular:     Rate and Rhythm: Bradycardia present.  Pulmonary:     Effort: Pulmonary effort is normal.     Comments: On high flow 20% Skin:    General: Skin is warm and dry.  Neurological:     Mental Status: He is alert.     Vital Signs: BP (!) 113/37   Pulse (!) 56   Temp 98 F (36.7 C) (Axillary)   Resp 18   Ht 5\' 6"  (1.676 m)   Wt 89.3 kg   SpO2 94%   BMI 31.78 kg/m  Pain Scale: 0-10   Pain Score: 0-No pain   SpO2: SpO2: 94 % O2 Device:SpO2: 94 % O2 Flow Rate: .O2 Flow Rate (L/min): (S) 20 L/min  IO: Intake/output summary:  Intake/Output Summary (Last 24 hours) at 08/17/2022 0844 Last data filed at 08/17/2022 0400 Gross per 24 hour  Intake 153 ml  Output 1650 ml  Net -1497 ml    LBM: Last BM Date : 08/08/22 Baseline Weight: Weight: 97.5 kg Most recent weight: Weight: 89.3 kg     Palliative Assessment/Data:  40 %     Time 75 minutes  Signed by: Lorinda Creed, NP   Please contact Palliative Medicine Team phone at [phone] for questions and concerns.  For individual provider: See Loretha Stapler

## 2022-08-18 ENCOUNTER — Encounter (HOSPITAL_COMMUNITY): Payer: Self-pay | Admitting: *Deleted

## 2022-08-18 DIAGNOSIS — J849 Interstitial pulmonary disease, unspecified: Secondary | ICD-10-CM

## 2022-08-18 DIAGNOSIS — I5033 Acute on chronic diastolic (congestive) heart failure: Secondary | ICD-10-CM | POA: Diagnosis not present

## 2022-08-18 LAB — CBC
HCT: 32.6 % — ABNORMAL LOW (ref 39.0–52.0)
Hemoglobin: 10.5 g/dL — ABNORMAL LOW (ref 13.0–17.0)
MCH: 31.9 pg (ref 26.0–34.0)
MCHC: 32.2 g/dL (ref 30.0–36.0)
MCV: 99.1 fL (ref 80.0–100.0)
Platelets: 47 10*3/uL — ABNORMAL LOW (ref 150–400)
RBC: 3.29 MIL/uL — ABNORMAL LOW (ref 4.22–5.81)
RDW: 16.3 % — ABNORMAL HIGH (ref 11.5–15.5)
WBC: 4.7 10*3/uL (ref 4.0–10.5)
nRBC: 0 % (ref 0.0–0.2)

## 2022-08-18 LAB — BASIC METABOLIC PANEL
Anion gap: 8 (ref 5–15)
BUN: 29 mg/dL — ABNORMAL HIGH (ref 8–23)
CO2: 27 mmol/L (ref 22–32)
Calcium: 8.2 mg/dL — ABNORMAL LOW (ref 8.9–10.3)
Chloride: 104 mmol/L (ref 98–111)
Creatinine, Ser: 1.18 mg/dL (ref 0.61–1.24)
GFR, Estimated: >60 mL/min (ref >60)
Glucose, Bld: 130 mg/dL — ABNORMAL HIGH (ref 70–99)
Potassium: 3.9 mmol/L (ref 3.5–5.1)
Sodium: 139 mmol/L (ref 135–145)

## 2022-08-18 NOTE — Progress Notes (Signed)
Patient ID: [name], male   DOB: 02-11-1950, 72 y.o.   MRN: [id number]    Progress Note from the Palliative Medicine Team at [facility]   Patient Name: [name]        Date: 08/18/2022 DOB: [birth year]  Age: 72 y.o. MRN#: [id number] Attending Physician: Barnetta Chapel, MD Primary Care Physician: Philip Aspen, Limmie Patricia, MD Admit Date: 08/09/2022  Extensive chart review has been completed prior to meeting with patient/family  including labs, vital signs, imaging, progress/consult notes, orders, medications and available advance directive documents.   72 y.o. male  admitted on 08/09/2022 with past medical history of pulmonary fibrosis, chronic hypoxemic respiratory failure, cirrhosis due to [facility], hypertension, hyperlipidemia, aortic stenosis sp ATVR, atrial fibrillation and heart failure who presented with dyspnea. Reported 3 days of worsening dyspnea and lower extremity edema. 15 lbs weight gain over last 2 months. On his initial physical examination his blood pressure was 137/57, HR 70, RR 25 and 02 saturation 97%, lungs with rales bilaterally, heart with S1 and S2 present and rhythmic, positive systolic murmur at the right lower sternal border, abdomen with no distention and positive lower extremity edema.    Chest radiograph with cardiomegaly, with bilateral hilar vascular congestion, no effusions. Sternotomy wires in place.    CT chest with bilateral ground glass opacities. Small right pleural effusion.    08-13-2022 patient with high O2 requirements, heated high flow nasal cannula initiated 08/14/2022 increased anxiety, utilize lorazepam, able to decrease opioids at 20 L/min 7-16 2024-weaned off heated high flow patient with 8 L nasal cannula 08-18-22 Current O2 needs at 9 L nasal cannula     Patient and his family face treatment option decisions, advanced directive decisions and anticipatory care needs.   This NP assessed patient at the bedside as a follow up to   yesterday's GOCs meeting, and to assess for palliative medicine needs and emotional support.  Patient is OOB in chair, he is alert and oriented, friend/[name] at bedside.  Continued conversation regarding patient's current medical situation and associated choices regarding next steps in transition of care, advanced directive decisions and anticipatory care needs.  Ultimately patient wishes to "get home"  Patient's friend tells me that family is considering transition to "fifth floor rehab" LTAC.   Again education offered regarding the differences between home health services, home hospice services, LTAC and SNF services offered.  Questions and concerns addressed  I was able to discuss the above concepts again by telephone with his SO/[name], she is his main support person.  They have been lifetime partners.  Again [name] clearly  understands patient's current serious medical situation, his pending decisions and choice having worked in Production assistant, radio for years.  Education offered on the importance of documentation of HPOA and ADP documents.   Patient wishes to complete with the support of spiritual care.  Will place request.   Education offered today regarding  the importance of continued conversation with family and their  medical providers regarding overall plan of care and treatment options,  ensuring decisions are within the context of the patients values and GOCs.  Questions and concerns addressed   Discussed with bedside RN and TOC via secure chat   Time:  75 minutes  Detailed review of medical records ( labs, imaging, vital signs), medically appropriate exam ( MS, skin, resp)   discussed with treatment team, counseling and education to patient, family, staff, documenting clinical information, medication management, coordination of care    [name]  [name] NP  Palliative Medicine Team Team Phone # [phone] Pager [phone]

## 2022-08-18 NOTE — Progress Notes (Signed)
Physical Therapy Treatment Patient Details Name: [name] MRN: [id number] DOB: 06-21-1950 Today's Date: 08/18/2022   History of Present Illness Pt is a 72 year old man admitted on 7/8 with shortness of breath and LE swelling. Significant Resp distress with heated HFNC.  PMH: pulmonary fibrosis on 5L O2, HTN, HLD, CHF, CAD s/p CABG, aortic stenosis s/p TAVR, afib s/p Watchman, Cirrhosis.    PT Comments  Pt admitted with above diagnosis. Pt progressing with ambulation with rollator and used NRB mask at 100% to keep sats >80% with activity. Was able to replace O2 at 11LHFNC at end of session with sats 93%.  Will continue to progress pt as able. Feel that pt could benefit from further endurance training and tolerated <3 hours day therapy.  Pt currently with functional limitations due to the deficits listed below (see PT Problem List). Pt will benefit from acute skilled PT to increase their independence and safety with mobility to allow discharge.        Assistance Recommended at Discharge Intermittent Supervision/Assistance  If plan is discharge home, recommend the following:  Can travel by private vehicle    A little help with walking and/or transfers;A little help with bathing/dressing/bathroom;Assistance with cooking/housework;Assist for transportation;Help with stairs or ramp for entrance      Equipment Recommendations  Rollator (4 wheels);Wheelchair (measurements PT);Wheelchair cushion (measurements PT);BSC/3in1;Other (comment) (Wedge for bed, possible shower chair, oxygen holder on rollator)    Recommendations for Other Services       Precautions / Restrictions Precautions Precautions: Fall Precaution Comments: on 11L HFNC on arrival Restrictions Weight Bearing Restrictions: No     Mobility  Bed Mobility               General bed mobility comments: in chair    Transfers Overall transfer level: Needs assistance Equipment used: Rolling walker (2  wheels) Transfers: Sit to/from Stand, Bed to chair/wheelchair/BSC Sit to Stand: Min guard   Step pivot transfers: Min guard       General transfer comment: min guard to power up and for balance for transfer to recliner    Ambulation/Gait Ambulation/Gait assistance: Min guard Gait Distance (Feet): 80 Feet (80 feet x 2) Assistive device: Rollator (4 wheels) Gait Pattern/deviations: Step-through pattern, Decreased stride length, Knee flexed in stance - right, Knee flexed in stance - left, Shuffle, Drifts right/left, Trunk flexed, Wide base of support   Gait velocity interpretation: <1.31 ft/sec, indicative of household ambulator   General Gait Details: Pt placed on 100% O2 NRB mask per MD agreement yesterday to try and incr pts endurance. Pt ambulated down hall with rollator with good safety and went 80 feet  prior to O2 desaturating to 80% and then allowed pt to sit down and rest on rollator. Took about 3 min for pt to return to 88% and above.  Pt then walked back to room, again saturations took about 4 min to return to 88% and >.  Nurse aware.   Stairs             Wheelchair Mobility     Tilt Bed    Modified Rankin (Stroke Patients Only)       Balance Overall balance assessment: Needs assistance Sitting-balance support: Feet supported Sitting balance-Leahy Scale: Fair Sitting balance - Comments: seated on EOB   Standing balance support: Bilateral upper extremity supported, During functional activity Standing balance-Leahy Scale: Poor Standing balance comment: reliant on RW for support  Cognition Arousal/Alertness: Awake/alert Behavior During Therapy: Flat affect Overall Cognitive Status: Within Functional Limits for tasks assessed                                          Exercises General Exercises - Lower Extremity Ankle Circles/Pumps: AROM, Both, 5 reps, Supine Long Arc Quad: AROM, Both, 10 reps,  Seated Heel Slides: AROM, Both, 5 reps, Supine    General Comments General comments (skin integrity, edema, etc.): 11L HFNC 92% SpO2 on arrival;  O2 during activity with 100% NRB down to 80% but did incr back to >88% with 4 min rest break. Replaced 11LHFNC at end of session and sat 93%      Pertinent Vitals/Pain Pain Assessment Pain Assessment: Faces Faces Pain Scale: Hurts a little bit Pain Location: neck Pain Descriptors / Indicators: Discomfort Pain Intervention(s): Limited activity within patient's tolerance, Monitored during session, Repositioned    Home Living                          Prior Function            PT Goals (current goals can now be found in the care plan section) Acute Rehab PT Goals Patient Stated Goal: to go home Progress towards PT goals: Progressing toward goals    Frequency    Min 1X/week      PT Plan Discharge plan needs to be updated    Co-evaluation              AM-PAC PT "6 Clicks" Mobility   Outcome Measure  Help needed turning from your back to your side while in a flat bed without using bedrails?: None Help needed moving from lying on your back to sitting on the side of a flat bed without using bedrails?: A Little Help needed moving to and from a bed to a chair (including a wheelchair)?: A Little Help needed standing up from a chair using your arms (e.g., wheelchair or bedside chair)?: A Little Help needed to walk in hospital room?: A Little Help needed climbing 3-5 steps with a railing? : A Lot 6 Click Score: 18    End of Session Equipment Utilized During Treatment: Gait belt;Oxygen Activity Tolerance: Patient limited by fatigue Patient left: with call bell/phone within reach;with family/visitor present;in chair;with chair alarm set Nurse Communication: Mobility status (Desaturation wtih activity) PT Visit Diagnosis: Muscle weakness (generalized) (M62.81);Unsteadiness on feet (R26.81)     Time: 5366-4403 PT  Time Calculation (min) (ACUTE ONLY): 30 min  Charges:    $Gait Training: 8-22 mins $Therapeutic Exercise: 8-22 mins PT General Charges $$ ACUTE PT VISIT: 1 Visit                     Jay Haskew M,PT Acute Rehab Services [phone]    [name] 08/18/2022, 1:54 PM

## 2022-08-18 NOTE — Plan of Care (Signed)

## 2022-08-18 NOTE — Progress Notes (Signed)
Progress Note   Patient: [name] JXB:[id number] DOB: 12/01/1950 DOA: 08/09/2022     8 DOS: the patient was seen and examined on 08/18/2022   Brief hospital course: Patient is a 72 yo male with the past medical history of pulmonary fibrosis, chronic hypoxemic respiratory failure, cirrhosis due to [facility], hypertension, hyperlipidemia, aortic stenosis sp ATVR, atrial fibrillation and heart failure who presented with dyspnea. Reported 3 days of worsening dyspnea and lower extremity edema. 15 lbs weight gain over last 2 months. On his initial physical examination his blood pressure was 137/57, HR 70, RR 25 and 02 saturation 97%, lungs with rales bilaterally, heart with S1 and S2 present and rhythmic, positive systolic murmur at the right lower sternal border, abdomen with no distention and positive lower extremity edema.    Chest radiograph with cardiomegaly, with bilateral hilar vascular congestion, no effusions. Sternotomy wires in place.    CT chest with bilateral ground glass opacities. Small right pleural effusion.    EKG 69 bpm, left axis deviation, left anterior fascicular block, right bundle branch block, qtc 508, sinus rhythm with PVC, no significant ST segment changes or T wave changes.    Patient was placed on furosemide for diuresis.    07/12 patient with high 02 requirements, heated high flow nasal cannula.  07/13 patient last night had anxiety, he did received lorazepam 0.25 mg IV Today 02 flow down to 20 L/min.   08/18/2022: Patient seen.  No new complaints.  Assessment and Plan: Acute on chronic diastolic CHF (congestive heart failure) (HCC) -Echocardiogram with preserved LV systolic function with EF 50 to 55%, mild LVH of the basal segment. Hypokinesis of the apical segment. RV systolic function preserved. LA with moderate dilatation. SP TAVR -Strict I's and O's. -Diuretics. -Continue spironolactone and SGLT 2 inh to augment diuresis.  -Carvedilol with holding parameters  due to bradycardia.   Acute on chronic hypoxemic respiratory failure: Follow up chest radiograph with bilateral hilar vascular congestion with no new infiltrates (personally reviewed).  Patient continue with high 02 requirements, but able to wean to 20 L from 30 L/min.  At home he uses up to 5 L/min.  08/18/2022: Patient is currently on 10 L of supplemental oxygen.  Continue to diurese patient.  Continue to titrate oxygen supplementation downwards.  Hypokalemia -Resolved. -Potassium is 3.9 today.  Elevated serum creatinine: -Improved from 1.28-1.18. -Continue to monitor renal function and electrolytes.    ILD (interstitial lung disease) (HCC): With concerns for mild acute exacerbation. Plan to continue diuresis and wean off oxygen supplementation as tolerated -Since initiation of steroids patient oxygen level down to 8 L high flow nasal cannula supplementation (requiring 25-30 L up to 08/16/2022). -CT chest with no pulmonary embolism, chronic subpleural reticulation and ground glass opacities.  -Continue with pirfenidone.  08/18/2022: Oxygen supplementation is currently 10 L/min.  Have a low threshold to consult the pulmonary team.  Ensure adequate diuresis.  PAF (paroxysmal atrial fibrillation) (HCC) Patient sinus rhythm with bradycardia rate 60 bpm, will continue with carvedilol with holding parameters. Sp watchman procedure, not on anticoagulation.  Gastritis and gastroduodenitis Continue with pantoprazole.   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   Will liberate his diet, but will continue with fluid restriction. Consult nutrition.  Add trazodone for sleep, avoid benzodiazepines,   Coronary atherosclerosis No chest pain or sings of acute coronary syndrome.  Dyslipidemia: -Continue with statin therapy.  Essential hypertension Patient with sinus  bradycardia with occasional  PVC. Reate 60 bpm,continue carvedilol with holding parameters.    Subjective:  -Shortness of breath persists. No chest pain.  Physical Exam: Vitals:   08/18/22 0500 08/18/22 0720 08/18/22 0829 08/18/22 1059  BP:  (!) 115/44 (!) 105/44 (!) 123/43  Pulse:  (!) 58 (!) 59 60  Resp:  19 20 17   Temp:  98.2 F (36.8 C)  97.8 F (36.6 C)  TempSrc:  Oral  Oral  SpO2:  93% (!) 89% 91%  Weight: 87.2 kg     Height:       General exam: Chronically ill looking.  Awake and alert. Respiratory system: Decreased air entry globally.   Cardiovascular system: S1-S2 with ejection systolic murmur. Gastrointestinal system: Soft and nontender. Central nervous system: Awake and alert.   Extremities: Chronic lower extremity edema.    Family Communication:  Disposition: Status is: Inpatient  Remains inpatient appropriate because: heart failure with volume overload, continue IV diuresis and IV steroids.    Planned Discharge Destination: Home with home health services.  Guarded prognosis.   Author: Barnetta Chapel, MD 08/18/2022 11:49 AM  For on call review [email or web address].

## 2022-08-18 NOTE — Progress Notes (Signed)
   08/18/22 1530  Mobility  Activity Ambulated with assistance in room;Ambulated with assistance in hallway  Level of Assistance Contact guard assist, steadying assist  Assistive Device Four wheel walker  Distance Ambulated (ft) 50 ft  Activity Response Tolerated well  Mobility Referral Yes  $Mobility charge 1 Mobility  Mobility Specialist Start Time (ACUTE ONLY) 1500  Mobility Specialist Stop Time (ACUTE ONLY) 1515  Mobility Specialist Time Calculation (min) (ACUTE ONLY) 15 min   Mobility Specialist: Progress Note   Pre-Mobility: SpO2 96% 9LO2 During Mobility: SpO2 80-90% 10LO2 Post-Mobility: SpO2 100% 9LO2   Pt agreeable to mobility session. Required MinG throughout using rollator. Pt desat to 80% on 10LO2 with ambulation, however recovered immediately (within ~10sec) to 90% with seated rest. After session, pt returned to recliner with all needs met.   Barnie Mort Mobility Specialist Please contact via SecureChat or Rehab office at [phone]

## 2022-08-18 NOTE — Progress Notes (Signed)
Occupational Therapy Treatment Patient Details Name: [name] MRN: [id number] DOB: 05/12/1950 Today's Date: 08/18/2022   History of present illness Pt is a 72 year old man admitted on 7/8 with shortness of breath and LE swelling. Significant Resp distress with heated HFNC.  PMH: pulmonary fibrosis on 5L O2, HTN, HLD, CHF, CAD s/p CABG, aortic stenosis s/p TAVR, afib s/p Watchman, Cirrhosis.   OT comments  Patient received in supine and agreeable to OOB with OT treatment. Patient was able to get to EOB without assistance and min guard to stand and transfer to recliner with RW and min guard assist and cues for safety, hand placement and lines. Patient performed UB dressing seated on EOB and grooming seated in recliner. SpO2 90% on 11 liters with BP 118/46 at end of session. Acute OT to continue to follow.    Recommendations for follow up therapy are one component of a multi-disciplinary discharge planning process, led by the attending physician.  Recommendations may be updated based on patient status, additional functional criteria and insurance authorization.    Assistance Recommended at Discharge Frequent or constant Supervision/Assistance  Patient can return home with the following  A little help with walking and/or transfers;A little help with bathing/dressing/bathroom;Assistance with cooking/housework;Assist for transportation;Help with stairs or ramp for entrance   Equipment Recommendations  Tub/shower seat    Recommendations for Other Services      Precautions / Restrictions Precautions Precautions: Fall Precaution Comments: on HHFNC Restrictions Weight Bearing Restrictions: No       Mobility Bed Mobility Overal bed mobility: Modified Independent                  Transfers Overall transfer level: Needs assistance Equipment used: Rolling walker (2 wheels) Transfers: Sit to/from Stand, Bed to chair/wheelchair/BSC Sit to Stand: Min guard     Step pivot  transfers: Min guard     General transfer comment: min guard to power up and for balance for transfer to recliner     Balance Overall balance assessment: Needs assistance Sitting-balance support: Feet supported Sitting balance-Leahy Scale: Fair Sitting balance - Comments: seated on EOB   Standing balance support: Bilateral upper extremity supported, During functional activity Standing balance-Leahy Scale: Poor Standing balance comment: reliant on RW for support                           ADL either performed or assessed with clinical judgement   ADL Overall ADL's : Needs assistance/impaired     Grooming: Wash/dry hands;Wash/dry face;Oral care;Brushing hair;Set up;Sitting Grooming Details (indicate cue type and reason): in recliner         Upper Body Dressing : Supervision/safety;Sitting Upper Body Dressing Details (indicate cue type and reason): on EOB     Toilet Transfer: Min guard;Rolling walker (2 wheels) Toilet Transfer Details (indicate cue type and reason): simulated to recliner                Extremity/Trunk Assessment              Vision       Perception     Praxis      Cognition Arousal/Alertness: Awake/alert Behavior During Therapy: Flat affect Overall Cognitive Status: Within Functional Limits for tasks assessed  Exercises      Shoulder Instructions       General Comments 11 liters HFNC SpO2 90% seated , HR 59, BP 118/46    Pertinent Vitals/ Pain       Pain Assessment Pain Assessment: Faces Faces Pain Scale: Hurts a little bit Pain Location: neck Pain Descriptors / Indicators: Discomfort Pain Intervention(s): Monitored during session  Home Living                                          Prior Functioning/Environment              Frequency  Min 1X/week        Progress Toward Goals  OT Goals(current goals can now be found in the  care plan section)  Progress towards OT goals: Progressing toward goals  Acute Rehab OT Goals OT Goal Formulation: With patient Time For Goal Achievement: 08/25/22 Potential to Achieve Goals: Good ADL Goals Pt Will Perform Grooming: with supervision;standing Pt Will Perform Lower Body Bathing: with supervision;sit to/from stand Pt Will Perform Lower Body Dressing: with supervision;sit to/from stand Pt Will Transfer to Toilet: with supervision;ambulating;regular height toilet Pt Will Perform Toileting - Clothing Manipulation and hygiene: with supervision;sit to/from stand Additional ADL Goal #1: Pt will generalize energy conservation strategies and pursed lip breathing in ADLs and mobility.  Plan Discharge plan remains appropriate    Co-evaluation                 AM-PAC OT "6 Clicks" Daily Activity     Outcome Measure   Help from another person eating meals?: None Help from another person taking care of personal grooming?: A Little Help from another person toileting, which includes using toliet, bedpan, or urinal?: A Little Help from another person bathing (including washing, rinsing, drying)?: A Lot Help from another person to put on and taking off regular upper body clothing?: A Little Help from another person to put on and taking off regular lower body clothing?: A Lot 6 Click Score: 17    End of Session Equipment Utilized During Treatment: Gait belt;Oxygen (11liters HFNC)  OT Visit Diagnosis: Other (comment)   Activity Tolerance Patient tolerated treatment well   Patient Left in chair;with call bell/phone within reach;with family/visitor present   Nurse Communication Mobility status        Time: 2956-2130 OT Time Calculation (min): 30 min  Charges: OT General Charges $OT Visit: 1 Visit OT Treatments $Self Care/Home Management : 23-37 mins  Alfonse Flavors, OTA Acute Rehabilitation Services  Office [phone]   Dewain Penning 08/18/2022, 1:43 PM

## 2022-08-18 NOTE — Progress Notes (Signed)
Pulmonary Individual Treatment Plan  Patient Details  Name: [name] MRN: [id number] Date of Birth: 1951/01/16 Referring Provider:   Flowsheet Row PULMONARY REHAB OTHER RESP ORIENTATION from 06/10/2022 in [facility]  Referring Provider Dr. Isaiah Serge       Initial Encounter Date:  Flowsheet Row PULMONARY REHAB OTHER RESP ORIENTATION from 06/10/2022 in [facility]  Date 06/10/22       Visit Diagnosis: ILD (interstitial lung disease) (HCC)  Patient's Home Medications on Admission:  No current facility-administered medications for this visit. No current outpatient medications on file.  Facility-Administered Medications Ordered in Other Visits:    (feeding supplement) PROSource Plus liquid 30 mL, 30 mL, Oral, TID BM, Arrien, York Ram, MD, 30 mL at 08/18/22 1556   alum & mag hydroxide-simeth (MAALOX/MYLANTA) 200-200-20 MG/5ML suspension 15 mL, 15 mL, Oral, Q6H PRN, Arrien, York Ram, MD, 15 mL at 08/17/22 1710   aspirin EC tablet 81 mg, 81 mg, Oral, Daily, Synetta Fail, MD, 81 mg at 08/18/22 1044   atorvastatin (LIPITOR) tablet 80 mg, 80 mg, Oral, Daily, Synetta Fail, MD, 80 mg at 08/18/22 1044   carvedilol (COREG) tablet 3.125 mg, 3.125 mg, Oral, BID WC, Arrien, York Ram, MD, 3.125 mg at 08/18/22 1556   empagliflozin (JARDIANCE) tablet 10 mg, 10 mg, Oral, Daily, Synetta Fail, MD, 10 mg at 08/18/22 1044   feeding supplement (ENSURE ENLIVE / ENSURE PLUS) liquid 237 mL, 237 mL, Oral, BID BM, Arrien, York Ram, MD, 237 mL at 08/17/22 0959   furosemide (LASIX) injection 80 mg, 80 mg, Intravenous, BID, Synetta Fail, MD, 80 mg at 08/18/22 0531   melatonin tablet 3 mg, 3 mg, Oral, QHS PRN, Hillary Bow, DO, 3 mg at 08/17/22 2201   methylPREDNISolone sodium succinate (SOLU-MEDROL) 125 mg/2 mL injection 80 mg, 80 mg, Intravenous, Daily, Vassie Loll, MD, 80 mg at 08/18/22 1044   multivitamin  with minerals tablet 1 tablet, 1 tablet, Oral, Daily, Arrien, York Ram, MD, 1 tablet at 08/18/22 1044   ondansetron (ZOFRAN) injection 4 mg, 4 mg, Intravenous, Q6H PRN, Arrien, York Ram, MD, 4 mg at 08/17/22 1603   pantoprazole (PROTONIX) EC tablet 40 mg, 40 mg, Oral, Daily, Arrien, York Ram, MD, 40 mg at 08/18/22 1044   Pirfenidone TABS 801 mg, 801 mg, Oral, TID WC, Synetta Fail, MD, 801 mg at 08/18/22 1204   polyethylene glycol (MIRALAX / GLYCOLAX) packet 17 g, 17 g, Oral, Daily PRN, Howerter, Justin B, DO, 17 g at 08/14/22 0024   sodium chloride flush (NS) 0.9 % injection 3 mL, 3 mL, Intravenous, Q12H, Synetta Fail, MD, 10 mL at 08/18/22 1045   spironolactone (ALDACTONE) tablet 25 mg, 25 mg, Oral, Daily, Zannie Cove, MD, 25 mg at 08/18/22 1044   tamsulosin (FLOMAX) capsule 0.4 mg, 0.4 mg, Oral, Daily, Arrien, York Ram, MD, 0.4 mg at 08/18/22 1044   traZODone (DESYREL) tablet 50 mg, 50 mg, Oral, QHS, Arrien, York Ram, MD, 50 mg at 08/17/22 2201  Past Medical History: Past Medical History:  Diagnosis Date   Adjustment disorder with depressed mood 09/07/2007   CAP (community acquired pneumonia) 02/16/2021   Cataract    removed bilaterally   Chronic kidney disease    kidney stones   Cirrhosis (HCC)    Colon polyps    Tubular Adenoma 2010   CONGESTIVE HEART FAILURE 12/09/2008   CORONARY ARTERY DISEASE 2006   HYPERLIPIDEMIA 08/09/2006   HYPERTENSION 08/09/2006  MYOCARDIAL INFARCTION, HX OF 07/07/2004   NEPHROLITHIASIS, HX OF 08/09/2006   OBESITY 07/19/2008   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 Excell Seltzer and Dr. Leafy Ro   Sleep apnea    has a cpap - not using religiously    Tobacco Use: Social History   Tobacco Use  Smoking Status Never    Passive exposure: Never  Smokeless Tobacco Never    Labs: Review Flowsheet  More data exists      Latest Ref Rng & Units 02/12/2021 08/10/2021 02/17/2022 03/16/2022 07/16/2022  Labs for ITP Cardiac and Pulmonary Rehab  Cholestrol 0 - 200 mg/dL 433  - - - -  LDL (calc) 0 - 99 mg/dL 65  - - - -  HDL-C >29.51 mg/dL 88.41  - - - -  Trlycerides 0.0 - 149.0 mg/dL 66.0  - - - -  Hemoglobin A1c 4.0 - 5.6 % 5.9  5.8  - - -  PH, Arterial 7.35 - 7.45 - - 7.443  7.373  -  PCO2 arterial 32 - 48 mmHg - - 30.4  36.7  -  Bicarbonate 20.0 - 28.0 mmol/L - - 20.8  22.5  21.4  25.3   TCO2 22 - 32 mmol/L - - 22  23  19  22  20   -  Acid-base deficit 0.0 - 2.0 mmol/L - - 3.0  1.0  3.0  -  O2 Saturation % - - 94  79  89  94.2     Details       Multiple values from one day are sorted in reverse-chronological order         Capillary Blood Glucose: Lab Results  Component Value Date   GLUCAP 156 (H) 07/16/2022   GLUCAP 140 (H) 06/24/2022   GLUCAP 138 (H) 06/22/2022   GLUCAP 128 (H) 06/10/2022     Pulmonary Assessment Scores:  Pulmonary Assessment Scores     Row Name 06/10/22 0950         ADL UCSD   ADL Phase Entry     SOB Score total 61     Walk 2     Stairs 1     Bath 1     Dress 2     Shop 2       CAT Score   CAT Score 25       mMRC Score   mMRC Score 3             UCSD: Self-administered rating of dyspnea associated with activities of daily living (ADLs) 6-point scale (0 = "not at all" to 5 = "maximal or unable to do because of breathlessness")  Scoring Scores range from 0 to 120.  Minimally important difference is 5 units  CAT: CAT can identify the health impairment of COPD patients and is better correlated with disease progression.  CAT has a scoring range of zero to 40. The CAT score is classified into four groups of low (less than 10), medium (10 - 20), high (21-30) and very high (31-40) based on the impact level of disease on health status. A CAT score over 10  suggests significant symptoms.  A worsening CAT score could be explained by an exacerbation, poor medication adherence, poor inhaler technique, or progression of COPD or comorbid conditions.  CAT MCID is  2 points  mMRC: mMRC (Modified Medical Research Council) Dyspnea Scale is used to assess the degree of baseline functional disability in patients of respiratory disease due to dyspnea. No minimal important difference is established. A decrease in score of 1 point or greater is considered a positive change.   Pulmonary Function Assessment:   Exercise Target Goals: Exercise Program Goal: Individual exercise prescription set using results from initial 6 min walk test and THRR while considering  patient's activity barriers and safety.   Exercise Prescription Goal: Initial exercise prescription builds to 30-45 minutes a day of aerobic activity, 2-3 days per week.  Home exercise guidelines will be given to patient during program as part of exercise prescription that the participant will acknowledge.  Activity Barriers & Risk Stratification:  Activity Barriers & Cardiac Risk Stratification - 06/10/22 0855       Activity Barriers & Cardiac Risk Stratification   Activity Barriers Back Problems;Deconditioning;Shortness of Breath;Assistive Device;Balance Concerns    Cardiac Risk Stratification High             6 Minute Walk:  6 Minute Walk     Row Name 06/10/22 1023         6 Minute Walk   Phase Initial     Distance 450 feet     Walk Time 6 minutes     # of Rest Breaks 2     MPH 0.85     METS 0.86     RPE 13     Perceived Dyspnea  14     VO2 Peak 3.03     Symptoms Yes (comment)     Comments pt needed two breaks due to SOB and oxygen levels dropping (79-80)     Resting HR 63 bpm     Resting BP 132/42     Resting Oxygen Saturation  92 %     Exercise Oxygen Saturation  during 6 min walk 78 %     Max Ex. HR 80 bpm     Max Ex. BP 152/60     2 Minute Post BP 140/58        Interval HR   1 Minute HR 81     2 Minute HR 80     3 Minute HR 79     4 Minute HR 80     5 Minute HR 80     6 Minute HR 79     2 Minute Post HR 74     Interval Heart Rate? Yes       Interval Oxygen   Interval Oxygen? Yes     Baseline Oxygen Saturation % 92 %     1 Minute Oxygen Saturation % 88 %     1 Minute Liters of Oxygen 8 L     2 Minute Oxygen Saturation % 78 %     2 Minute Liters of Oxygen 8 L     3 Minute Oxygen Saturation % 81 %     3 Minute Liters of Oxygen 8 L     4 Minute Oxygen Saturation % 88 %     4 Minute Liters of Oxygen 8 L     5 Minute Oxygen Saturation % 81 %     5 Minute Liters of Oxygen 8 L     6 Minute Oxygen Saturation % 79 %     6 Minute Liters of Oxygen 8 L     2 Minute Post Oxygen Saturation % 86 %  2 Minute Post Liters of Oxygen 8 L              Oxygen Initial Assessment:  Oxygen Initial Assessment - 06/10/22 1031       Home Oxygen   Home Oxygen Device Home Concentrator;E-Tanks    Sleep Oxygen Prescription Continuous    Liters per minute 4    Home Resting Oxygen Prescription Continuous    Liters per minute 4    Compliance with Home Oxygen Use Yes      Initial 6 min Walk   Oxygen Used Continuous    Liters per minute 8      Program Oxygen Prescription   Program Oxygen Prescription Continuous    Liters per minute 6    Comments pt will start at 6 Liters and if need go to 8 liters      Intervention   Short Term Goals To learn and exhibit compliance with exercise, home and travel O2 prescription;To learn and understand importance of monitoring SPO2 with pulse oximeter and demonstrate accurate use of the pulse oximeter.;To learn and understand importance of maintaining oxygen saturations>88%;To learn and demonstrate proper pursed lip breathing techniques or other breathing techniques.     Long  Term Goals Exhibits compliance with exercise, home  and travel O2 prescription;Maintenance of O2 saturations>88%;Verbalizes importance of  monitoring SPO2 with pulse oximeter and return demonstration;Exhibits proper breathing techniques, such as pursed lip breathing or other method taught during program session             Oxygen Re-Evaluation:  Oxygen Re-Evaluation     Row Name 06/23/22 1435 07/20/22 1316           Program Oxygen Prescription   Program Oxygen Prescription Continuous Continuous      Liters per minute 6 6      Comments Pt did well on 6 liters --        Home Oxygen   Home Oxygen Device Home Concentrator;E-Tanks Home Concentrator;E-Tanks      Sleep Oxygen Prescription Continuous Continuous      Liters per minute 4 4      Home Resting Oxygen Prescription Continuous Continuous      Liters per minute 4 4      Compliance with Home Oxygen Use Yes Yes        Goals/Expected Outcomes   Short Term Goals To learn and exhibit compliance with exercise, home and travel O2 prescription;To learn and understand importance of monitoring SPO2 with pulse oximeter and demonstrate accurate use of the pulse oximeter.;To learn and understand importance of maintaining oxygen saturations>88%;To learn and demonstrate proper pursed lip breathing techniques or other breathing techniques.  To learn and exhibit compliance with exercise, home and travel O2 prescription;To learn and understand importance of monitoring SPO2 with pulse oximeter and demonstrate accurate use of the pulse oximeter.;To learn and understand importance of maintaining oxygen saturations>88%;To learn and demonstrate proper pursed lip breathing techniques or other breathing techniques.       Long  Term Goals Exhibits compliance with exercise, home  and travel O2 prescription;Maintenance of O2 saturations>88%;Verbalizes importance of monitoring SPO2 with pulse oximeter and return demonstration;Exhibits proper breathing techniques, such as pursed lip breathing or other method taught during program session Exhibits compliance with exercise, home  and travel O2  prescription;Maintenance of O2 saturations>88%;Verbalizes importance of monitoring SPO2 with pulse oximeter and return demonstration;Exhibits proper breathing techniques, such as pursed lip breathing or other method taught during program session      Goals/Expected  Outcomes compliance compliance               Oxygen Discharge (Final Oxygen Re-Evaluation):  Oxygen Re-Evaluation - 07/20/22 1316       Program Oxygen Prescription   Program Oxygen Prescription Continuous    Liters per minute 6      Home Oxygen   Home Oxygen Device Home Concentrator;E-Tanks    Sleep Oxygen Prescription Continuous    Liters per minute 4    Home Resting Oxygen Prescription Continuous    Liters per minute 4    Compliance with Home Oxygen Use Yes      Goals/Expected Outcomes   Short Term Goals To learn and exhibit compliance with exercise, home and travel O2 prescription;To learn and understand importance of monitoring SPO2 with pulse oximeter and demonstrate accurate use of the pulse oximeter.;To learn and understand importance of maintaining oxygen saturations>88%;To learn and demonstrate proper pursed lip breathing techniques or other breathing techniques.     Long  Term Goals Exhibits compliance with exercise, home  and travel O2 prescription;Maintenance of O2 saturations>88%;Verbalizes importance of monitoring SPO2 with pulse oximeter and return demonstration;Exhibits proper breathing techniques, such as pursed lip breathing or other method taught during program session    Goals/Expected Outcomes compliance             Initial Exercise Prescription:  Initial Exercise Prescription - 06/10/22 1000       Date of Initial Exercise RX and Referring Provider   Date 06/10/22    Referring Provider Dr. Isaiah Serge    Expected Discharge Date 09/09/22      Oxygen   Oxygen Continuous    Liters 6    Maintain Oxygen Saturation 88% or higher      NuStep   Level 1    SPM 60    Minutes 39      Prescription  Details   Frequency (times per week) 2    Duration Progress to 30 minutes of continuous aerobic without signs/symptoms of physical distress      Intensity   THRR 40-80% of Max Heartrate 58-118    Ratings of Perceived Exertion 11-13    Perceived Dyspnea 0-4      Resistance Training   Training Prescription Yes    Weight 2    Reps 10-15             Perform Capillary Blood Glucose checks as needed.  Exercise Prescription Changes:   Exercise Prescription Changes     Row Name 06/22/22 1500 07/06/22 1600 07/20/22 1500 07/29/22 1500       Response to Exercise   Blood Pressure (Admit) 130/52 124/52 120/60 120/58    Blood Pressure (Exercise) 140/60 124/66 140/58 140/60    Blood Pressure (Exit) 136/60 110/50 140/60 120/54    Heart Rate (Admit) 70 bpm 64 bpm 65 bpm 74 bpm    Heart Rate (Exercise) 75 bpm 69 bpm 68 bpm 70 bpm    Heart Rate (Exit) 77 bpm 66 bpm 63 bpm 67 bpm    Oxygen Saturation (Admit) 89 % 91 % 91 % 92 %    Oxygen Saturation (Exercise) 83 % 89 % 89 % 88 %    Oxygen Saturation (Exit) 89 % 90 % 93 % 94 %    Rating of Perceived Exertion (Exercise) 12 12 12 12     Perceived Dyspnea (Exercise) 13 12 12 12     Duration Continue with 30 min of aerobic exercise without signs/symptoms of physical distress. Continue with 30  min of aerobic exercise without signs/symptoms of physical distress. Continue with 30 min of aerobic exercise without signs/symptoms of physical distress. Continue with 30 min of aerobic exercise without signs/symptoms of physical distress.    Intensity THRR unchanged THRR unchanged THRR unchanged THRR unchanged      Progression   Progression Continue to progress workloads to maintain intensity without signs/symptoms of physical distress. Continue to progress workloads to maintain intensity without signs/symptoms of physical distress. Continue to progress workloads to maintain intensity without signs/symptoms of physical distress. Continue to progress  workloads to maintain intensity without signs/symptoms of physical distress.      Resistance Training   Training Prescription Yes Yes Yes Yes    Weight 3 3 3 3     Reps 10-15 10-15 10-15 10-15    Time 10 Minutes 10 Minutes 10 Minutes 10 Minutes      Oxygen   Oxygen Continuous Continuous Continuous Continuous    Liters 6 6 6 10       NuStep   Level 1 1 1 1     SPM 83 86 85 87    Minutes 39 39 39 39    METs 1.9 1.9 1.9 1.8             Exercise Comments:   Exercise Goals and Review:   Exercise Goals     Row Name 06/10/22 1030 06/23/22 1432 07/20/22 1306 08/17/22 1530       Exercise Goals   Increase Physical Activity Yes Yes Yes Yes    Intervention Provide advice, education, support and counseling about physical activity/exercise needs.;Develop an individualized exercise prescription for aerobic and resistive training based on initial evaluation findings, risk stratification, comorbidities and participant's personal goals. Provide advice, education, support and counseling about physical activity/exercise needs.;Develop an individualized exercise prescription for aerobic and resistive training based on initial evaluation findings, risk stratification, comorbidities and participant's personal goals. Provide advice, education, support and counseling about physical activity/exercise needs.;Develop an individualized exercise prescription for aerobic and resistive training based on initial evaluation findings, risk stratification, comorbidities and participant's personal goals. Provide advice, education, support and counseling about physical activity/exercise needs.;Develop an individualized exercise prescription for aerobic and resistive training based on initial evaluation findings, risk stratification, comorbidities and participant's personal goals.    Expected Outcomes Long Term: Add in home exercise to make exercise part of routine and to increase amount of physical activity.;Short Term:  Attend rehab on a regular basis to increase amount of physical activity.;Long Term: Exercising regularly at least 3-5 days a week. Long Term: Add in home exercise to make exercise part of routine and to increase amount of physical activity.;Short Term: Attend rehab on a regular basis to increase amount of physical activity.;Long Term: Exercising regularly at least 3-5 days a week. Long Term: Add in home exercise to make exercise part of routine and to increase amount of physical activity.;Short Term: Attend rehab on a regular basis to increase amount of physical activity.;Long Term: Exercising regularly at least 3-5 days a week. Long Term: Add in home exercise to make exercise part of routine and to increase amount of physical activity.;Short Term: Attend rehab on a regular basis to increase amount of physical activity.;Long Term: Exercising regularly at least 3-5 days a week.    Increase Strength and Stamina Yes Yes Yes Yes    Intervention Provide advice, education, support and counseling about physical activity/exercise needs.;Develop an individualized exercise prescription for aerobic and resistive training based on initial evaluation findings, risk stratification, comorbidities and  participant's personal goals. Provide advice, education, support and counseling about physical activity/exercise needs.;Develop an individualized exercise prescription for aerobic and resistive training based on initial evaluation findings, risk stratification, comorbidities and participant's personal goals. Provide advice, education, support and counseling about physical activity/exercise needs.;Develop an individualized exercise prescription for aerobic and resistive training based on initial evaluation findings, risk stratification, comorbidities and participant's personal goals. Provide advice, education, support and counseling about physical activity/exercise needs.;Develop an individualized exercise prescription for aerobic  and resistive training based on initial evaluation findings, risk stratification, comorbidities and participant's personal goals.    Expected Outcomes Short Term: Increase workloads from initial exercise prescription for resistance, speed, and METs.;Short Term: Perform resistance training exercises routinely during rehab and add in resistance training at home;Long Term: Improve cardiorespiratory fitness, muscular endurance and strength as measured by increased METs and functional capacity ( ) Short Term: Increase workloads from initial exercise prescription for resistance, speed, and METs.;Short Term: Perform resistance training exercises routinely during rehab and add in resistance training at home;Long Term: Improve cardiorespiratory fitness, muscular endurance and strength as measured by increased METs and functional capacity ( ) Short Term: Increase workloads from initial exercise prescription for resistance, speed, and METs.;Short Term: Perform resistance training exercises routinely during rehab and add in resistance training at home;Long Term: Improve cardiorespiratory fitness, muscular endurance and strength as measured by increased METs and functional capacity ( ) Short Term: Increase workloads from initial exercise prescription for resistance, speed, and METs.;Short Term: Perform resistance training exercises routinely during rehab and add in resistance training at home;Long Term: Improve cardiorespiratory fitness, muscular endurance and strength as measured by increased METs and functional capacity ( )    Able to understand and use rate of perceived exertion (RPE) scale Yes Yes Yes Yes    Intervention Provide education and explanation on how to use RPE scale Provide education and explanation on how to use RPE scale Provide education and explanation on how to use RPE scale Provide education and explanation on how to use RPE scale    Expected Outcomes Short Term: Able to use RPE daily in  rehab to express subjective intensity level;Long Term:  Able to use RPE to guide intensity level when exercising independently Short Term: Able to use RPE daily in rehab to express subjective intensity level;Long Term:  Able to use RPE to guide intensity level when exercising independently Short Term: Able to use RPE daily in rehab to express subjective intensity level;Long Term:  Able to use RPE to guide intensity level when exercising independently Short Term: Able to use RPE daily in rehab to express subjective intensity level;Long Term:  Able to use RPE to guide intensity level when exercising independently    Able to understand and use Dyspnea scale Yes Yes Yes Yes    Intervention Provide education and explanation on how to use Dyspnea scale Provide education and explanation on how to use Dyspnea scale Provide education and explanation on how to use Dyspnea scale Provide education and explanation on how to use Dyspnea scale    Expected Outcomes Short Term: Able to use Dyspnea scale daily in rehab to express subjective sense of shortness of breath during exertion;Long Term: Able to use Dyspnea scale to guide intensity level when exercising independently Short Term: Able to use Dyspnea scale daily in rehab to express subjective sense of shortness of breath during exertion;Long Term: Able to use Dyspnea scale to guide intensity level when exercising independently Short Term: Able to use Dyspnea scale daily in rehab to express subjective  sense of shortness of breath during exertion;Long Term: Able to use Dyspnea scale to guide intensity level when exercising independently Short Term: Able to use Dyspnea scale daily in rehab to express subjective sense of shortness of breath during exertion;Long Term: Able to use Dyspnea scale to guide intensity level when exercising independently    Knowledge and understanding of Target Heart Rate Range (THRR) Yes Yes Yes Yes    Intervention Provide education and explanation  of THRR including how the numbers were predicted and where they are located for reference Provide education and explanation of THRR including how the numbers were predicted and where they are located for reference Provide education and explanation of THRR including how the numbers were predicted and where they are located for reference Provide education and explanation of THRR including how the numbers were predicted and where they are located for reference    Expected Outcomes Short Term: Able to state/look up THRR;Long Term: Able to use THRR to govern intensity when exercising independently;Short Term: Able to use daily as guideline for intensity in rehab Short Term: Able to state/look up THRR;Long Term: Able to use THRR to govern intensity when exercising independently;Short Term: Able to use daily as guideline for intensity in rehab Short Term: Able to state/look up THRR;Long Term: Able to use THRR to govern intensity when exercising independently;Short Term: Able to use daily as guideline for intensity in rehab Short Term: Able to state/look up THRR;Long Term: Able to use THRR to govern intensity when exercising independently;Short Term: Able to use daily as guideline for intensity in rehab    Understanding of Exercise Prescription Yes Yes Yes Yes    Intervention Provide education, explanation, and written materials on patient's individual exercise prescription Provide education, explanation, and written materials on patient's individual exercise prescription Provide education, explanation, and written materials on patient's individual exercise prescription Provide education, explanation, and written materials on patient's individual exercise prescription    Expected Outcomes Short Term: Able to explain program exercise prescription;Long Term: Able to explain home exercise prescription to exercise independently Short Term: Able to explain program exercise prescription;Long Term: Able to explain home  exercise prescription to exercise independently Short Term: Able to explain program exercise prescription;Long Term: Able to explain home exercise prescription to exercise independently Short Term: Able to explain program exercise prescription;Long Term: Able to explain home exercise prescription to exercise independently             Exercise Goals Re-Evaluation :  Exercise Goals Re-Evaluation     Row Name 06/23/22 1432 07/20/22 1310 08/17/22 1530         Exercise Goal Re-Evaluation   Exercise Goals Review Increase Physical Activity;Increase Strength and Stamina;Able to understand and use rate of perceived exertion (RPE) scale;Able to understand and use Dyspnea scale;Knowledge and understanding of Target Heart Rate Range (THRR);Understanding of Exercise Prescription Increase Physical Activity;Increase Strength and Stamina;Able to understand and use rate of perceived exertion (RPE) scale;Able to understand and use Dyspnea scale;Knowledge and understanding of Target Heart Rate Range (THRR);Understanding of Exercise Prescription Increase Physical Activity;Increase Strength and Stamina;Able to understand and use rate of perceived exertion (RPE) scale;Able to understand and use Dyspnea scale;Knowledge and understanding of Target Heart Rate Range (THRR);Understanding of Exercise Prescription     Comments Pt has completed their first session of PR. He is eager to be in the program and ready to go. He is slightly deconditioned and will progress slowly. His SpO2 dropped (80%) during exercise at 4L so he was increase  to 6L. He is currently exercising at 1.9 METs on the stepper. Will continue to monitor and progress as able. Pt has completed 5 sessions of pulmonary rehab. At first the patient seemd to be egar to be in the program but he was been a no show last week. during his last visit his SpO2 dropped (81%) and it has been taking longer for it to come back up to 90%. He is currently on 6 liters of oxygen  while exercising. We messaged Dr. Isaiah Serge and was given orders to have the patient wear O2 mask and be up to 10 liters if needed. When the patient returns to rehab we will have him wear the mask and increase his O2 levels to see if this improves his exercise SpO2. He was currently exercising at 1.9 METs on the stepper. Will continue to monitor and progress as able. Pt has not been to pulmonary ehab since 6/27 due to being admitted to [facility] on 7/8. Will update once patient is discharged and called.     Expected Outcomes Through exercise at rehab and homw, the patient will meet their stated goals, Through exercise at rehab and home, the patient will achieve their goals. Through exercise at rehab and home, the patient will achieve their goals.              Discharge Exercise Prescription (Final Exercise Prescription Changes):  Exercise Prescription Changes - 07/29/22 1500       Response to Exercise   Blood Pressure (Admit) 120/58    Blood Pressure (Exercise) 140/60    Blood Pressure (Exit) 120/54    Heart Rate (Admit) 74 bpm    Heart Rate (Exercise) 70 bpm    Heart Rate (Exit) 67 bpm    Oxygen Saturation (Admit) 92 %    Oxygen Saturation (Exercise) 88 %    Oxygen Saturation (Exit) 94 %    Rating of Perceived Exertion (Exercise) 12    Perceived Dyspnea (Exercise) 12    Duration Continue with 30 min of aerobic exercise without signs/symptoms of physical distress.    Intensity THRR unchanged      Progression   Progression Continue to progress workloads to maintain intensity without signs/symptoms of physical distress.      Resistance Training   Training Prescription Yes    Weight 3    Reps 10-15    Time 10 Minutes      Oxygen   Oxygen Continuous    Liters 10      NuStep   Level 1    SPM 87    Minutes 39    METs 1.8             Nutrition:  Target Goals: Understanding of nutrition guidelines, daily intake of sodium 1500mg , cholesterol 200mg , calories 30% from fat and 7%  or less from saturated fats, daily to have 5 or more servings of fruits and vegetables.  Biometrics:  Pre Biometrics - 06/10/22 1031       Pre Biometrics   Height 5\' 6"  (1.676 m)    Weight 209 lb 10.5 oz (95.1 kg)    Waist Circumference 41 inches    Hip Circumference 45 inches    Waist to Hip Ratio 0.91 %    BMI (Calculated) 33.86    Triceps Skinfold 10 mm    % Body Fat 29.1 %    Grip Strength 27.3 kg    Flexibility 0 in    Single Leg Stand 0 seconds  Nutrition Therapy Plan and Nutrition Goals:  Nutrition Therapy & Goals - 06/10/22 0952       Personal Nutrition Goals   Comments Patient scored 63 on his diet assessment. Handout explained and provided regarding healthier choices. We provide educational information on heart healthy nutrition with handouts and assistance with RD referral if patient is interested.      Intervention Plan   Intervention Nutrition handout(s) given to patient.    Expected Outcomes Short Term Goal: Understand basic principles of dietary content, such as calories, fat, sodium, cholesterol and nutrients.             Nutrition Assessments:  Nutrition Assessments - 06/10/22 0952       MEDFICTS Scores   Pre Score 63            MEDIFICTS Score Key: ?70 Need to make dietary changes  40-70 Heart Healthy Diet ? 40 Therapeutic Level Cholesterol Diet   Picture Your Plate Scores: <65 Unhealthy dietary pattern with much room for improvement. 41-50 Dietary pattern unlikely to meet recommendations for good health and room for improvement. 51-60 More healthful dietary pattern, with some room for improvement.  >60 Healthy dietary pattern, although there may be some specific behaviors that could be improved.    Nutrition Goals Re-Evaluation:   Nutrition Goals Discharge (Final Nutrition Goals Re-Evaluation):   Psychosocial: Target Goals: Acknowledge presence or absence of significant depression and/or stress, maximize coping  skills, provide positive support system. Participant is able to verbalize types and ability to use techniques and skills needed for reducing stress and depression.  Initial Review & Psychosocial Screening:  Initial Psych Review & Screening - 06/10/22 0957       Initial Review   Current issues with None Identified      Family Dynamics   Good Support System? Yes      Barriers   Psychosocial barriers to participate in program There are no identifiable barriers or psychosocial needs.      Screening Interventions   Interventions Encouraged to exercise;To provide support and resources with identified psychosocial needs;Provide feedback about the scores to participant    Expected Outcomes Short Term goal: Utilizing psychosocial counselor, staff and physician to assist with identification of specific Stressors or current issues interfering with healing process. Setting desired goal for each stressor or current issue identified.             Quality of Life Scores:  Quality of Life - 06/10/22 1155       Quality of Life   Select Quality of Life      Quality of Life Scores   Health/Function Pre 14.06 %    Socioeconomic Pre 22.19 %    Psych/Spiritual Pre 18.93 %    Family Pre 25 %    GLOBAL Pre 18.33 %            Scores of 19 and below usually indicate a poorer quality of life in these areas.  A difference of  2-3 points is a clinically meaningful difference.  A difference of 2-3 points in the total score of the Quality of Life Index has been associated with significant improvement in overall quality of life, self-image, physical symptoms, and general health in studies assessing change in quality of life.   PHQ-9: Review Flowsheet  More data exists      06/10/2022 02/12/2021 11/30/2019 11/29/2018 04/11/2018  Depression screen PHQ 2/9  Decreased Interest 0 0 0 0 0  Down, Depressed, Hopeless 0 0 0  0 0  PHQ - 2 Score 0 0 0 0 0  Altered sleeping 1 - 0 0 -  Tired, decreased energy  1 - 0 0 -  Change in appetite 1 - 0 0 -  Feeling bad or failure about yourself  0 - 0 0 -  Trouble concentrating 0 - 0 0 -  Moving slowly or fidgety/restless 0 - 0 0 -  Suicidal thoughts 0 - 0 0 -  PHQ-9 Score 3 - 0 0 -  Difficult doing work/chores Not difficult at all - Not difficult at all Not difficult at all -    Details           Interpretation of Total Score  Total Score Depression Severity:  1-4 = Minimal depression, 5-9 = Mild depression, 10-14 = Moderate depression, 15-19 = Moderately severe depression, 20-27 = Severe depression   Psychosocial Evaluation and Intervention:  Psychosocial Evaluation - 06/10/22 0958       Psychosocial Evaluation & Interventions   Interventions Stress management education;Relaxation education;Encouraged to exercise with the program and follow exercise prescription    Comments Patient has no psychosocial barriers or issues identified at his orientation visit. He is accompained by his wife today both very pleasant. 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 He says most of his work is remote. He looks at and approves building plans. He says he plans to retire in the next year but does enjoy his work. He says he has a good support system with his wife of many years and he has a lot of friends that support him. He loves to play golf when he can. 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 He says he is hopeful the program will help him get stronger; improve his walking and breathing and he can get back to his normal activities. He is ready to start the program.    Expected Outcomes Patient will continue to have no psychosocial barriers identified.    Continue Psychosocial Services  No Follow up required             Psychosocial Re-Evaluation:  Psychosocial  Re-Evaluation     Row Name 06/14/22 0836 07/13/22 1124 08/09/22 0925         Psychosocial Re-Evaluation   Current issues with None Identified None Identified None Identified     Comments Patient plans to start soon.  Patient referred to PR with Interstitial Lung Disease by Dr. Isaiah Serge.    Attended orientation on  06-10-22.  His personal goals for the program are to breath better and get back to his normal activites. Patient is new to the program attending his 5th session.   Patient referred to PR with Interstitial Lung Disease by Dr. Isaiah Serge. His personal goals for the program are to breath better and get back to his normal activites.   His initial QOL score is 18.33% and his PHQ-9 is 3.  Patient seems to enjoy coming to the program and is interest in improving his health.  We will continue to monitor as he progresses in the program. Patient  has completed his 5th session. He seems to enjoy coming to the program and demonstartes and intesest in improving his health and continues to deomostarte a positive outlook and attitude.  Patient also enjoys  class and interactive with  class and staff.   His personal goals for the program are to breath better and get back to his normal activites.  Patient seems to enjoy coming to the program and is interest in improving his health.  We will continue to monitor as he progresses in the program and works toward meeting these goals.     Expected Outcomes Patient will have psychosocial barriers identified at discharge. Patient will have no psychosocial barriers identified at discharge. Patient will have no psychosocial barriers identified at discharge.     Interventions Encouraged to attend Pulmonary Rehabilitation for the exercise;Relaxation education;Stress management education Encouraged to attend Pulmonary Rehabilitation for the exercise;Relaxation education;Stress management education Encouraged to attend Pulmonary Rehabilitation for the exercise;Relaxation  education;Stress management education     Continue Psychosocial Services  No Follow up required No Follow up required No Follow up required              Psychosocial Discharge (Final Psychosocial Re-Evaluation):  Psychosocial Re-Evaluation - 08/09/22 7829       Psychosocial Re-Evaluation   Current issues with None Identified    Comments Patient  has completed his 5th session. He seems to enjoy coming to the program and demonstartes and intesest in improving his health and continues to deomostarte a positive outlook and attitude.  Patient also enjoys  class and interactive with class and staff.   His personal goals for the program are to breath better and get back to his normal activites.  Patient seems to enjoy coming to the program and is interest in improving his health.  We will continue to monitor as he progresses in the program and works toward meeting these goals.    Expected Outcomes Patient will have no psychosocial barriers identified at discharge.    Interventions Encouraged to attend Pulmonary Rehabilitation for the exercise;Relaxation education;Stress management education    Continue Psychosocial Services  No Follow up required              Education: Education Goals: Education classes will be provided on a weekly basis, covering required topics. Participant will state understanding/return demonstration of topics presented.  Learning Barriers/Preferences:  Learning Barriers/Preferences - 06/10/22 0953       Learning Barriers/Preferences   Learning Barriers None    Learning Preferences Audio             Education Topics: How Lungs Work and Diseases: - Discuss the anatomy of the lungs and diseases that can affect the lungs, such as COPD.   Exercise: -Discuss the importance of exercise, FITT principles of exercise, normal and abnormal responses to exercise, and how to exercise safely.   Environmental Irritants: -Discuss types of environmental irritants  and how to limit exposure to environmental irritants.   Meds/Inhalers and oxygen: - Discuss respiratory medications, definition of an inhaler and oxygen, and the proper way to use an inhaler and oxygen.   Energy Saving Techniques: - Discuss methods to conserve energy and decrease shortness of breath when performing activities of daily living.    Bronchial Hygiene / Breathing Techniques: - Discuss breathing mechanics, pursed-lip breathing technique,  proper posture, effective ways to clear airways, and other functional breathing techniques   Cleaning Equipment: - Provides group verbal and written instruction about the health risks of elevated stress, cause of high stress, and healthy ways to reduce stress.   Nutrition I: Fats: - Discuss the types of cholesterol, what cholesterol does to the body, and how cholesterol levels can be controlled.   Nutrition II: Labels: -  Discuss the different components of food labels and how to read food labels. Flowsheet Row PULMONARY REHAB OTHER RESPIRATORY from 06/24/2022 in [facility]  Date 06/24/22  Educator Handout  Instruction Review Code 1- Verbalizes Understanding       Respiratory Infections: - Discuss the signs and symptoms of respiratory infections, ways to prevent respiratory infections, and the importance of seeking medical treatment when having a respiratory infection. Flowsheet Row PULMONARY REHAB OTHER RESPIRATORY from 07/29/2022 in [facility]  Date 07/08/22  Educator handout       Stress I: Signs and Symptoms: - Discuss the causes of stress, how stress may lead to anxiety and depression, and ways to limit stress.   Stress II: Relaxation: -Discuss relaxation techniques to limit stress.   Oxygen for Home/Travel: - Discuss how to prepare for travel when on oxygen and proper ways to transport and store oxygen to ensure safety. Flowsheet Row PULMONARY REHAB OTHER RESPIRATORY from  07/29/2022 in [facility]  Date 07/29/22  Educator handout       Knowledge Questionnaire Score:  Knowledge Questionnaire Score - 06/10/22 0953       Knowledge Questionnaire Score   Pre Score 15/18             Core Components/Risk Factors/Patient Goals at Admission:  Personal Goals and Risk Factors at Admission - 06/10/22 0954       Core Components/Risk Factors/Patient Goals on Admission    Weight Management Obesity    Improve shortness of breath with ADL's Yes    Intervention Provide education, individualized exercise plan and daily activity instruction to help decrease symptoms of SOB with activities of daily living.    Expected Outcomes Short Term: Improve cardiorespiratory fitness to achieve a reduction of symptoms when performing ADLs;Long Term: Be able to perform more ADLs without symptoms or delay the onset of symptoms    Increase knowledge of respiratory medications and ability to use respiratory devices properly  Yes    Intervention Provide education and demonstration as needed of appropriate use of medications, inhalers, and oxygen therapy.    Expected Outcomes Short Term: Achieves understanding of medications use. Understands that oxygen is a medication prescribed by physician. Demonstrates appropriate use of inhaler and oxygen therapy.;Long Term: Maintain appropriate use of medications, inhalers, and oxygen therapy.    Heart Failure Yes    Intervention Provide a combined exercise and nutrition program that is supplemented with education, support and counseling about heart failure. Directed toward relieving symptoms such as shortness of breath, decreased exercise tolerance, and extremity edema.    Expected Outcomes Short term: Attendance in program 2-3 days a week with increased exercise capacity. Reported lower sodium intake. Reported increased fruit and vegetable intake. Reports medication compliance.    Hypertension Yes    Intervention Provide  education on lifestyle modifcations including regular physical activity/exercise, weight management, moderate sodium restriction and increased consumption of fresh fruit, vegetables, and low fat dairy, alcohol moderation, and smoking cessation.;Monitor prescription use compliance.    Expected Outcomes Short Term: Continued assessment and intervention until BP is < 140/86mm HG in hypertensive participants. < 130/77mm HG in hypertensive participants with diabetes, heart failure or chronic kidney disease.;Long Term: Maintenance of blood pressure at goal levels.    Personal Goal Other Yes    Personal Goal Patient wants to breathe better and get back to his normal activities.    Intervention Patient will attend PR 2 days/week with exercise and education.    Expected  Outcomes Patient will complete the program meeting both personal and program goals.             Core Components/Risk Factors/Patient Goals Review:   Goals and Risk Factor Review     Row Name 06/14/22 0844 07/13/22 1132 08/09/22 0935         Core Components/Risk Factors/Patient Goals Review   Personal Goals Review Improve shortness of breath with ADL's;Develop more efficient breathing techniques such as purse lipped breathing and diaphragmatic breathing and practicing self-pacing with activity.;Other Improve shortness of breath with ADL's;Develop more efficient breathing techniques such as purse lipped breathing and diaphragmatic breathing and practicing self-pacing with activity.;Other --     Review Patient plans to start soon.  Patient referred to PR with Interstitial Lung Disease by Dr. Isaiah Serge. His personal  goals for the program are to breath better and get back to his normal activities.  We will encourage his progress as he meets these goals. Patient is new to PR program, attending his 5th session.   Patient referred to PR with Interstitial Lung Disease by Dr. Isaiah Serge.  His personal  goals for the program are to breath better and get  back to his normal activities.  He exercises with oxygen at 6L via nasal cannula.  Tolerates exercise on the nu-step at level 1.   We continue to encourage  pursed lipped breathing and practice self pacing with increased activity.   Pre-exercise O2 sats  are 90% and exercise O2 sats are between 81%-89% .  Plans to change oxygen to mask while exercising.  We will continue to monitor his progress as he works toward meeting these goals. Patient has completed his 5th session.   His personal  goals for the program are to breath better and get back to his normal activities.  He exercises with oxygen at 10L via face mask.  Tolerates exercise on the nu-step at level 1.   We continue to work with and encourage him, but he is very deconditioned  We will help him develop a efficient breathing techniques such as,  pursed lipped breathing and practice self pacing with increased activity.   Pre-exercise O2 sats  are 92% and exercise O2 sats are between 83%-89% .  He is doing well in program and consistent attendance.   We will continue to monitor his progress as he works toward meeting these goals.     Expected Outcomes Patient will complete the program meeting both program and peresonal goals. Patient will complete the program meeting both program and peresonal goals. Patient will complete the program meeting both program and peresonal goals.              Core Components/Risk Factors/Patient Goals at Discharge (Final Review):   Goals and Risk Factor Review - 08/09/22 0935       Core Components/Risk Factors/Patient Goals Review   Review Patient has completed his 5th session.   His personal  goals for the program are to breath better and get back to his normal activities.  He exercises with oxygen at 10L via face mask.  Tolerates exercise on the nu-step at level 1.   We continue to work with and encourage him, but he is very deconditioned  We will help him develop a efficient breathing techniques such as,  pursed  lipped breathing and practice self pacing with increased activity.   Pre-exercise O2 sats  are 92% and exercise O2 sats are between 83%-89% .  He is doing well in program and  consistent attendance.   We will continue to monitor his progress as he works toward meeting these goals.    Expected Outcomes Patient will complete the program meeting both program and peresonal goals.             ITP Comments:  ITP Comments     Row Name 07/08/22 1557 08/17/22 1541 08/18/22 1610       ITP Comments Notified Dr. Isaiah Serge of the pt's desaturation during exercise.  Today was the pt's 5th visit, and during exercise his 02 sats were 83% on 6L 02 via [place].  The pt initially came to the program on 4L 02 via [place] and had to be bumped up to 6L on his first day.  During the halfway point of exercise, his 02 sats have been ranging from 81-86% and he has to stop exercising and deep breathe for his 02 sats to get up to 88%.  Today during exercise he had to stop for several minutes to get his 02 sats up to 89%.  I increased his oxygen to 8L via [place] and during the next check his 02 sats were 86%.  Per Dr. Isaiah Serge, we can place the pt on a mask during exercise and increase his oxygen up to 10 L.  The pt verbalized understanding of changing to a mask next week and that we might have to increase his oxygen to 10 L. Spoke with patient and his wife.  Pt reports possible discharge home from [facility] on Thursday or Friday.  Instructed to bring return to Rehab class from PCP. 30 day review completed. ITP sent to Dr.Jehanzeb Memon, Medical Director of  Pulmonary Rehab. Continue with ITP unless changes are made by physician.  Pt is currently in hospital for pnuemonia and heart failure. Last attended rehab on 07/29/22.              Comments: 30 day review

## 2022-08-18 NOTE — Progress Notes (Signed)
   Heart Failure Stewardship Pharmacist Progress Note   PCP: Philip Aspen, Limmie Patricia, MD PCP-Cardiologist: Olga Millers, MD    HPI:  72 yo M with PMH of HTN, HLD, CHF, CAD s/p CABG, aortic stenosis s/p TAVR, afib s/p Watchman, pulmonary fibrosis, ILD, chronic respiratory failure on 5L O2 at baseline, and cirrhosis.   Presented to the ED on 7/8 with shortness of breath, weight gain, and LE edema. Was hypoxic in the 80s on 6L O2 and was placed on nonrebreather at 15L. BNP elevated to 1031. CXR showed cardiomegaly without overt edema and atelectasis vs PNA. CTA negative for PE. Last ECHO from 06/2022 showed LVEF 50-55%, regional wall motion abnormalities, mild LVH, RV normal. Amyloid screen was negative 05/2022.  Patient was transitioned from furosemide to torsemide as outpatient on 04/23/22. He has been getting both medications refilled at the pharmacy. His wife states he has been alternating torsemide and furosemide based on his response to the diuretic.  Current HF Medications: Diuretic: furosemide 80 mg IV BID Beta Blocker: carvedilol 3.125 mg BID MRA: spironolactone 25 mg daily SGLT2i: Jardiance 10 mg daily  Prior to admission HF Medications: Diuretic: furosemide 80 mg daily AND torsemide 60 mg daily Beta blocker: carvedilol 3.125 mg BID SGLT2i: Jardiance 10 mg daily  Pertinent Lab Values: Serum creatinine 1.18, BUN 29, Potassium 3.9, Sodium 139, BNP 1031.6, Magnesium 2.5  Vital Signs: Weight: 192 lbs (admission weight: 212 lbs) Blood pressure: 110/50s  Heart rate: 50s  I/O: net -1.4L yesterday; net -17L since admission  Medication Assistance / Insurance Benefits Check: Does the patient have prescription insurance?  Yes Type of insurance plan: [facility] Medicare  Outpatient Pharmacy:  [facility] Is the patient willing to use [facility]OC pharmacy at discharge? Yes Is the patient willing to transition their outpatient pharmacy to utilize a [facility]  outpatient pharmacy?   Patient is interested in the mail order option. He would like to consider this more and then will let me know when he decides.     Assessment: 1. Acute on chronic diastolic CHF (LVEF 50-55%). NYHA class III symptoms. - Continue furosemide 80 mg IV BID. Good urine output so far. LE edema improving, still with edema. Strict I/Os and daily weights. Keep K>4 and Mg>2. Will need to discontinue the furosemide prescription at discharge. - Continue carvedilol 3.125 mg BID - Continue spironolactone 25 mg daily - Continue Jardiance 10 mg daily   Plan: 1) Medication changes recommended at this time: - Continue IV diuresis - Stop PO furosemide on discharge  2) Patient assistance: - None pending  3)  Education  - Patient has been educated on current HF medications and potential additions to HF medication regimen - Patient verbalizes understanding that over the next few months, these medication doses may change and more medications may be added to optimize HF regimen - Patient has been educated on basic disease state pathophysiology and goals of therapy   Sharen Hones, PharmD, BCPS Heart Failure Stewardship Pharmacist Phone [phone]

## 2022-08-18 NOTE — Plan of Care (Signed)
Pt able to state goals

## 2022-08-19 ENCOUNTER — Encounter: Payer: 59 | Admitting: Internal Medicine

## 2022-08-19 ENCOUNTER — Other Ambulatory Visit (HOSPITAL_COMMUNITY): Payer: Self-pay

## 2022-08-19 ENCOUNTER — Encounter (HOSPITAL_COMMUNITY): Payer: 59

## 2022-08-19 ENCOUNTER — Inpatient Hospital Stay
Admission: AD | Admit: 2022-08-19 | Discharge: 2022-10-03 | Disposition: E | Payer: Self-pay | Source: Other Acute Inpatient Hospital | Attending: Internal Medicine | Admitting: Internal Medicine

## 2022-08-19 DIAGNOSIS — I5033 Acute on chronic diastolic (congestive) heart failure: Secondary | ICD-10-CM | POA: Diagnosis not present

## 2022-08-19 MED ORDER — TRAZODONE HCL 50 MG PO TABS: 50.0000 mg | ORAL_TABLET | Freq: Every day | ORAL | Status: DC

## 2022-08-19 MED ORDER — SPIRONOLACTONE 25 MG PO TABS: 25.0000 mg | ORAL_TABLET | Freq: Every day | ORAL | Status: DC

## 2022-08-19 MED ORDER — POLYETHYLENE GLYCOL 3350 17 G PO PACK
17.0000 g | PACK | Freq: Every day | ORAL | 0 refills | Status: DC | PRN
  Filled 2022-08-19: qty 14, 14d supply, fill #0

## 2022-08-19 MED ORDER — FUROSEMIDE 10 MG/ML IJ SOLN
80.0000 mg | Freq: Two times a day (BID) | INTRAMUSCULAR | 0 refills | Status: DC
  Filled 2022-08-19: qty 4, 1d supply, fill #0

## 2022-08-19 MED ORDER — METHYLPREDNISOLONE SODIUM SUCC 125 MG IJ SOLR
80.0000 mg | Freq: Every day | INTRAMUSCULAR | 0 refills | Status: DC
  Filled 2022-08-19: qty 1, 1d supply, fill #0

## 2022-08-19 MED ORDER — PROSOURCE PLUS PO LIQD: 30.0000 mL | Freq: Three times a day (TID) | ORAL | Status: DC

## 2022-08-19 MED ORDER — MELATONIN 3 MG PO TABS: 3.0000 mg | ORAL_TABLET | Freq: Every evening | ORAL | Status: DC | PRN

## 2022-08-19 MED ORDER — PANTOPRAZOLE SODIUM 40 MG PO TBEC: 40.0000 mg | DELAYED_RELEASE_TABLET | Freq: Every day | ORAL | Status: DC

## 2022-08-19 MED ORDER — ENSURE ENLIVE PO LIQD
237.0000 mL | Freq: Two times a day (BID) | ORAL | 12 refills | Status: DC
  Filled 2022-08-19: qty 237, 1d supply, fill #0

## 2022-08-19 MED ORDER — ADULT MULTIVITAMIN W/MINERALS CH: 1.0000 | ORAL_TABLET | Freq: Every day | ORAL | Status: DC

## 2022-08-19 NOTE — Progress Notes (Signed)
Notified Dr Imogene Burn of bp 108/37 map 58.

## 2022-08-19 NOTE — Plan of Care (Signed)
°  Problem: Activity: Goal: Risk for activity intolerance will decrease Outcome: Not Progressing   Problem: Nutrition: Goal: Adequate nutrition will be maintained Outcome: Progressing   Problem: Coping: Goal: Level of anxiety will decrease Outcome: Progressing   Problem: Pain Managment: Goal: General experience of comfort will improve Outcome: Progressing   Problem: Safety: Goal: Ability to remain free from injury will improve Outcome: Progressing

## 2022-08-19 NOTE — Progress Notes (Signed)
Home medication "Pirfenidone" has been returned to the patient after [facility].

## 2022-08-19 NOTE — Discharge Summary (Signed)
Physician Discharge Summary  Patient ID: [name] MRN: [id number] DOB/AGE: 1950-05-15 72 y.o.  Admit date: 08/09/2022 Discharge date: 08/02/2022  Admission Diagnoses:  Discharge Diagnoses:  Principal Problem:   Acute on chronic diastolic CHF (congestive heart failure) (HCC) Active Problems:   Essential hypertension   Coronary atherosclerosis   Hepatic cirrhosis (HCC)   Gastritis and gastroduodenitis   PAF (paroxysmal atrial fibrillation) (HCC)   ILD (interstitial lung disease) (HCC)   Hypokalemia   Discharged Condition: stable  Hospital Course:  Patient is a 72 year old male with past medical history significant for pulmonary fibrosis, chronic hypoxemic respiratory failure, cirrhosis due to NASH, hypertension, hyperlipidemia, aortic stenosis sp ATVR, atrial fibrillation and heart failure.  Patient presented with dyspnea (reported 3 days of worsening dyspnea) and lower extremity edema and 15 lbs weight gain over last 2 months.  Patient was admitted and managed for acute on chronic diastolic congestive heart failure.  Chest radiograph revealed cardiomegaly, with bilateral hilar vascular congestion, no effusions. Sternotomy wires in place.  CT chest revealed bilateral ground glass opacities and small right pleural effusion.  EKG 69 bpm, left axis deviation, left anterior fascicular block, right bundle branch block, qtc 508, sinus rhythm with PVC, no significant ST segment changes or T wave changes.  Patient required significant amount of supplemental oxygen.  Patient is currently on 10 L of supplemental oxygen via nasal cannula.  At baseline, patient is on 5 L of supplemental oxygen.  Patient was managed with IV diuretics.  Patient will be discharged to [facility] facility on the fifth floor of this hospital to continue weaning and management of acute on chronic diastolic congestive heart failure.  Acute on chronic diastolic CHF (congestive heart failure) (HCC) -Echocardiogram revealed  preserved LV systolic function with EF 50 to 55%, mild LVH of the basal segment. Hypokinesis of the apical segment. RV systolic function preserved. LA with moderate dilatation. SP TAVR -Strict I's and O's. -Diuretics. -Continue spironolactone and SGLT 2 inh to augment diuresis.  -Carvedilol with holding parameters due to bradycardia.    Acute on chronic hypoxemic respiratory failure: -Follow up chest radiograph with bilateral hilar vascular congestion with no new infiltrates (personally reviewed).  -Patient continue with high 02 requirements, but able to wean down to 10 L/min.  At home, patient was on 5 L/min of supplemental oxygen.   -Continue to titrate oxygen supplementation downwards.   Hypokalemia -Resolved. -Last potassium was 3.9. -Continue to monitor closely.     Elevated serum creatinine: -Improved from 1.28-1.18. -Continue to monitor renal function and electrolytes.     ILD (interstitial lung disease) (HCC): -Concerns for mild acute exacerbation. -Continue diuresis, and wean off oxygen supplementation as tolerated -CT chest was negative for pulmonary embolism.   -Continue with pirfenidone.   PAF (paroxysmal atrial fibrillation) (HCC) -Continue with carvedilol with holding parameters. -Sp watchman procedure, not on anticoagulation.   Gastritis and gastroduodenitis Continue with pantoprazole.    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   Coronary atherosclerosis No chest pain or sings of acute coronary syndrome.   Dyslipidemia: -Continue with statin therapy.   Essential hypertension Patient with sinus bradycardia with occasional PVC. Reate 60 bpm,continue carvedilol with holding parameters.      Consults: cardiology and palliative care team.  Significant Diagnostic Studies:  CT angio chest PE protocol revealed: 1. No pulmonary embolus. 2. Small bilateral pleural  effusions with associated atelectasis. 3. Chronic subpleural reticulation and ground-glass, typical of  interstitial lung disease. 4. Multi chamber cardiomegaly. Coronary artery calcifications. 5. Hepatic cirrhosis. Splenomegaly only partially included. 6. Cholelithias   Disposition: Discharge disposition: 02-Transferred to [facility]       Discharge Instructions     Diet - low sodium heart healthy   Complete by: As directed    Discharge wound care:   Complete by: As directed    Continue current wound care regimen.   Increase activity slowly   Complete by: As directed       Allergies as of 09/01/2022   No Known Allergies      Medication List     STOP taking these medications    acetaminophen 500 MG tablet Commonly known as: TYLENOL   amoxicillin 500 MG tablet Commonly known as: AMOXIL   furosemide 40 MG tablet Commonly known as: LASIX Replaced by: furosemide 10 MG/ML injection   Klor-Con M20 20 MEQ tablet Generic drug: potassium chloride SA   LIVER PROTECT PO   Torsemide 60 MG Tabs       TAKE these medications    (feeding supplement) PROSource Plus liquid Take 30 mLs by mouth 3 (three) times daily between meals.   feeding supplement Liqd Take 237 mLs by mouth 2 (two) times daily between meals. Start taking on: August 20, 2022   aspirin EC 81 MG tablet Take 1 tablet (81 mg total) by mouth daily. Swallow whole.   atorvastatin 80 MG tablet Commonly known as: LIPITOR TAKE 1 TABLET BY MOUTH EVERY DAY What changed: when to take this   carvedilol 3.125 MG tablet Commonly known as: COREG TAKE 1 TABLET BY MOUTH TWICE A DAY WITH FOOD   [facility] VITAMIN B12 1000 MCG tablet Generic drug: cyanocobalamin TAKE 1 TABLET BY MOUTH EVERY DAY   furosemide 10 MG/ML injection Commonly known as: LASIX Inject 8 mLs (80 mg total) into the vein 2 (two) times daily. Replaces: furosemide 40 MG tablet   Jardiance 10 MG Tabs tablet Generic drug:  empagliflozin TAKE 1 TABLET BY MOUTH DAILY BEFORE BREAKFAST.   melatonin 3 MG Tabs tablet Take 1 tablet (3 mg total) by mouth at bedtime as needed.   methylPREDNISolone sodium succinate 125 mg/2 mL injection Commonly known as: SOLU-MEDROL Inject 1.28 mLs (80 mg total) into the vein daily. Start taking on: August 20, 2022   multivitamin with minerals Tabs tablet Take 1 tablet by mouth daily. Start taking on: August 20, 2022   OXYGEN Inhale into the lungs.   pantoprazole 40 MG tablet Commonly known as: PROTONIX Take 1 tablet (40 mg total) by mouth daily. Start taking on: August 20, 2022   Pirfenidone 267 MG Tabs Take 3 tablets (801 mg total) by mouth 3 (three) times daily with meals.   polyethylene glycol 17 g packet Commonly known as: MIRALAX / GLYCOLAX Take 17 g by mouth daily as needed for moderate constipation.   spironolactone 25 MG tablet Commonly known as: ALDACTONE Take 1 tablet (25 mg total) by mouth daily. Start taking on: August 20, 2022   tamsulosin 0.4 MG Caps capsule Commonly known as: FLOMAX TAKE ONE CAPSULE BY MOUTH EVERY DAY AFTER SUPPER What changed: See the new instructions.   traZODone 50 MG tablet Commonly known as: DESYREL Take 1 tablet (50 mg total) by mouth at bedtime.               Discharge Care Instructions  (From admission, onward)           Start  Ordered   08/21/2022 0000  Discharge wound care:       Comments: Continue current wound care regimen.   08/31/2022 1419            Follow-up Information     [facility]. Go in 11 day(s).   Specialty: Cardiology Why: Hospital follow up 08/23/2022 @ 2 pm PLEASE bring a current medication list to appointment FREE valet parking, Entrance C, off [address] [address] information: [address] [place] [address] [phone]               Time spent: 35 minutes.  SignedBarnetta Chapel 09/01/2022,  2:20 PM

## 2022-08-19 NOTE — TOC Transition Note (Signed)
Transition of Care [facility]) - CM/SW Discharge Note   Patient Details  Name: [name] MRN: [id number] Date of Birth: 05-28-1950  [facility]) CM/SW Contact:  Leone Haven, RN Phone Number: 08/05/2022, 2:24 PM   Clinical Narrative:    Patient has been approved for ltach per [facility] with [facility].  Patient in agreement. For dc to [facility] today. Wife at the bedside.   Final next level of care: Long Term Acute Care (LTAC) Barriers to Discharge: No Barriers Identified   Patient Goals and CMS Choice CMS Medicare.gov Compare Post Acute Care list provided to:: Patient Choice offered to / list presented to : Patient  Discharge Placement                         Discharge Plan and Services Additional resources added to the After Visit Summary for   In-house Referral: NA Discharge Planning Services: CM Consult Post Acute Care Choice: NA          DME Arranged: N/A DME Agency: NA       HH Arranged: NA HH Agency: NA        Social Determinants of Health (SDOH) Interventions SDOH Screenings   Food Insecurity: No Food Insecurity (08/18/2022)  Housing: Low Risk  (08/11/2022)  Transportation Needs: No Transportation Needs (08/11/2022)  Utilities: Not At Risk (08/18/2022)  Alcohol Screen: Low Risk  (08/11/2022)  Depression (PHQ2-9): Low Risk  (06/10/2022)  Financial Resource Strain: Low Risk  (08/11/2022)  Tobacco Use: Low Risk  (08/09/2022)     Readmission Risk Interventions    12/18/2021   11:14 AM  Readmission Risk Prevention Plan  Post Dischage Appt Complete  Medication Screening Complete  Transportation Screening Complete

## 2022-08-19 NOTE — Progress Notes (Signed)
   Heart Failure Stewardship Pharmacist Progress Note   PCP: Philip Aspen, Limmie Patricia, MD PCP-Cardiologist: Olga Millers, MD    HPI:  72 yo M with PMH of HTN, HLD, CHF, CAD s/p CABG, aortic stenosis s/p TAVR, afib s/p Watchman, pulmonary fibrosis, ILD, chronic respiratory failure on 5L O2 at baseline, and cirrhosis.   Presented to the ED on 7/8 with shortness of breath, weight gain, and LE edema. Was hypoxic in the 80s on 6L O2 and was placed on nonrebreather at 15L. BNP elevated to 1031. CXR showed cardiomegaly without overt edema and atelectasis vs PNA. CTA negative for PE. Last ECHO from 06/2022 showed LVEF 50-55%, regional wall motion abnormalities, mild LVH, RV normal. Amyloid screen was negative 05/2022.  Patient was transitioned from furosemide to torsemide as outpatient on 04/23/22. He has been getting both medications refilled at the pharmacy. His wife states he has been alternating torsemide and furosemide based on his response to the diuretic.  Current HF Medications: Diuretic: furosemide 80 mg IV BID Beta Blocker: carvedilol 3.125 mg BID MRA: spironolactone 25 mg daily SGLT2i: Jardiance 10 mg daily  Prior to admission HF Medications: Diuretic: furosemide 80 mg daily AND torsemide 60 mg daily Beta blocker: carvedilol 3.125 mg BID SGLT2i: Jardiance 10 mg daily  Pertinent Lab Values: As of 7/17: Serum creatinine 1.18, BUN 29, Potassium 3.9, Sodium 139, BNP 1031.6, Magnesium 2.5  Vital Signs: Weight: 199 lbs (admission weight: 212 lbs) Blood pressure: 110/50s  Heart rate: 50s  I/O: net -1.6L yesterday; net -18.1L since admission  Medication Assistance / Insurance Benefits Check: Does the patient have prescription insurance?  Yes Type of insurance plan: [facility] Medicare  Outpatient Pharmacy:  [facility] Is the patient willing to use [facility] TOC pharmacy at discharge? Yes Is the patient willing to transition their outpatient pharmacy to utilize  a [facility] outpatient pharmacy?   Patient is interested in the mail order option. He would like to consider this more and then will let me know when he decides.     Assessment: 1. Acute on chronic diastolic CHF (LVEF 50-55%). NYHA class III symptoms. - Continue furosemide 80 mg IV BID. Good urine output so far. LE edema improving, still with high O2 requirements. Strict I/Os and daily weights. Keep K>4 and Mg>2. Will need to discontinue the furosemide prescription at discharge. - Continue carvedilol 3.125 mg BID - Continue spironolactone 25 mg daily - Continue Jardiance 10 mg daily   Plan: 1) Medication changes recommended at this time: - Continue IV diuresis - Stop PO furosemide on discharge  2) Patient assistance: - None pending  3)  Education  - Patient has been educated on current HF medications and potential additions to HF medication regimen - Patient verbalizes understanding that over the next few months, these medication doses may change and more medications may be added to optimize HF regimen - Patient has been educated on basic disease state pathophysiology and goals of therapy   Sharen Hones, PharmD, BCPS Heart Failure Stewardship Pharmacist Phone [phone]

## 2022-08-19 NOTE — Progress Notes (Signed)
PT Cancellation Note  Patient Details Name: [name] MRN: [id number] DOB: Nov 14, 1950   Cancelled Treatment:    Reason Eval/Treat Not Completed: Other (comment) (RN giving report to [employer]. pt/family report pt is about to transfer to [facility]. Will hold and follow up if pt remains in acute setting.)    Renaldo Fiddler PT, DPT Acute Rehabilitation Services Office [phone]  09/01/2022 2:16 PM

## 2022-08-19 NOTE — Progress Notes (Signed)
This chaplain responded to PMT [facility] consult for creating/updating the Pt. Advance Directive: HCPOA.  The chaplain understands the Pt. is interested in choosing [facility] as his healthcare agent.  The Pt. is awake and introduced the chaplain to the Pt. (Wife?)-[name] at the time of the visit. The chaplain understands the Pt. prefers not to talk about HCPOA today. A blank AD was given to the Pt. for review with plans for chaplain F/U at another time.  Chaplain Stephanie Acre [phone]

## 2022-08-20 ENCOUNTER — Telehealth (HOSPITAL_COMMUNITY): Payer: Self-pay | Admitting: *Deleted

## 2022-08-20 DIAGNOSIS — J84112 Idiopathic pulmonary fibrosis: Secondary | ICD-10-CM

## 2022-08-20 DIAGNOSIS — I5033 Acute on chronic diastolic (congestive) heart failure: Secondary | ICD-10-CM

## 2022-08-20 DIAGNOSIS — J9621 Acute and chronic respiratory failure with hypoxia: Secondary | ICD-10-CM

## 2022-08-20 DIAGNOSIS — I480 Paroxysmal atrial fibrillation: Secondary | ICD-10-CM

## 2022-08-20 LAB — CBC
HCT: 33.9 % — ABNORMAL LOW (ref 39.0–52.0)
Hemoglobin: 11 g/dL — ABNORMAL LOW (ref 13.0–17.0)
MCH: 31.7 pg (ref 26.0–34.0)
MCHC: 32.4 g/dL (ref 30.0–36.0)
MCV: 97.7 fL (ref 80.0–100.0)
Platelets: 51 10*3/uL — ABNORMAL LOW (ref 150–400)
RBC: 3.47 MIL/uL — ABNORMAL LOW (ref 4.22–5.81)
RDW: 16.2 % — ABNORMAL HIGH (ref 11.5–15.5)
WBC: 5.5 10*3/uL (ref 4.0–10.5)
nRBC: 0 % (ref 0.0–0.2)

## 2022-08-20 LAB — COMPREHENSIVE METABOLIC PANEL
ALT: 24 U/L (ref 0–44)
AST: 47 U/L — ABNORMAL HIGH (ref 15–41)
Albumin: 2.6 g/dL — ABNORMAL LOW (ref 3.5–5.0)
Alkaline Phosphatase: 55 U/L (ref 38–126)
Anion gap: 12 (ref 5–15)
BUN: 30 mg/dL — ABNORMAL HIGH (ref 8–23)
CO2: 25 mmol/L (ref 22–32)
Calcium: 8.5 mg/dL — ABNORMAL LOW (ref 8.9–10.3)
Chloride: 101 mmol/L (ref 98–111)
Creatinine, Ser: 1.24 mg/dL (ref 0.61–1.24)
GFR, Estimated: >60 mL/min (ref >60)
Glucose, Bld: 152 mg/dL — ABNORMAL HIGH (ref 70–99)
Potassium: 3.7 mmol/L (ref 3.5–5.1)
Sodium: 138 mmol/L (ref 135–145)
Total Bilirubin: 1.4 mg/dL — ABNORMAL HIGH (ref 0.3–1.2)
Total Protein: 5.9 g/dL — ABNORMAL LOW (ref 6.5–8.1)

## 2022-08-20 LAB — TSH: TSH: 2.718 u[IU]/mL (ref 0.350–4.500)

## 2022-08-20 NOTE — Telephone Encounter (Signed)
Attempted to call patient and remind of appointment in Heart Failure [facility] Monday 08/23/22. No answer and unable to leave message (VM full).

## 2022-08-21 ENCOUNTER — Other Ambulatory Visit (HOSPITAL_COMMUNITY): Payer: Self-pay

## 2022-08-21 DIAGNOSIS — I5033 Acute on chronic diastolic (congestive) heart failure: Secondary | ICD-10-CM

## 2022-08-21 DIAGNOSIS — J9621 Acute and chronic respiratory failure with hypoxia: Secondary | ICD-10-CM

## 2022-08-21 DIAGNOSIS — I480 Paroxysmal atrial fibrillation: Secondary | ICD-10-CM

## 2022-08-21 DIAGNOSIS — J84112 Idiopathic pulmonary fibrosis: Secondary | ICD-10-CM

## 2022-08-21 LAB — BPAM PLATELET PHERESIS
Blood Product Expiration Date: 202407212359
Blood Product Expiration Date: 202407222359
ISSUE DATE / TIME: 202407201308
Unit Type and Rh: 5100
Unit Type and Rh: 6200

## 2022-08-21 LAB — PREPARE PLATELET PHERESIS: Unit division: 0

## 2022-08-21 LAB — PROTIME-INR
INR: 1.8 — ABNORMAL HIGH (ref 0.8–1.2)
Prothrombin Time: 21.1 seconds — ABNORMAL HIGH (ref 11.4–15.2)

## 2022-08-21 LAB — APTT: aPTT: 35 seconds (ref 24–36)

## 2022-08-22 DIAGNOSIS — I5033 Acute on chronic diastolic (congestive) heart failure: Secondary | ICD-10-CM

## 2022-08-22 DIAGNOSIS — J9621 Acute and chronic respiratory failure with hypoxia: Secondary | ICD-10-CM

## 2022-08-22 DIAGNOSIS — J84112 Idiopathic pulmonary fibrosis: Secondary | ICD-10-CM

## 2022-08-22 DIAGNOSIS — I480 Paroxysmal atrial fibrillation: Secondary | ICD-10-CM

## 2022-08-22 LAB — CBC
HCT: 32.2 % — ABNORMAL LOW (ref 39.0–52.0)
Hemoglobin: 10.6 g/dL — ABNORMAL LOW (ref 13.0–17.0)
MCH: 31.3 pg (ref 26.0–34.0)
MCHC: 32.9 g/dL (ref 30.0–36.0)
MCV: 95 fL (ref 80.0–100.0)
Platelets: 69 10*3/uL — ABNORMAL LOW (ref 150–400)
RBC: 3.39 MIL/uL — ABNORMAL LOW (ref 4.22–5.81)
RDW: 16.2 % — ABNORMAL HIGH (ref 11.5–15.5)
WBC: 7.4 10*3/uL (ref 4.0–10.5)
nRBC: 0 % (ref 0.0–0.2)

## 2022-08-22 LAB — AMMONIA: Ammonia: 44 umol/L — ABNORMAL HIGH (ref 9–35)

## 2022-08-22 LAB — COMPREHENSIVE METABOLIC PANEL
ALT: 27 U/L (ref 0–44)
AST: 48 U/L — ABNORMAL HIGH (ref 15–41)
Albumin: 2.3 g/dL — ABNORMAL LOW (ref 3.5–5.0)
Alkaline Phosphatase: 59 U/L (ref 38–126)
Anion gap: 10 (ref 5–15)
BUN: 32 mg/dL — ABNORMAL HIGH (ref 8–23)
CO2: 27 mmol/L (ref 22–32)
Calcium: 8.3 mg/dL — ABNORMAL LOW (ref 8.9–10.3)
Chloride: 100 mmol/L (ref 98–111)
Creatinine, Ser: 1.26 mg/dL — ABNORMAL HIGH (ref 0.61–1.24)
GFR, Estimated: >60 mL/min (ref >60)
Glucose, Bld: 147 mg/dL — ABNORMAL HIGH (ref 70–99)
Potassium: 3.5 mmol/L (ref 3.5–5.1)
Sodium: 137 mmol/L (ref 135–145)
Total Bilirubin: 2.1 mg/dL — ABNORMAL HIGH (ref 0.3–1.2)
Total Protein: 5.7 g/dL — ABNORMAL LOW (ref 6.5–8.1)

## 2022-08-22 LAB — BPAM PLATELET PHERESIS: ISSUE DATE / TIME: 202407201339

## 2022-08-22 LAB — PREPARE PLATELET PHERESIS: Unit division: 0

## 2022-08-23 ENCOUNTER — Encounter (HOSPITAL_COMMUNITY): Payer: 59

## 2022-08-23 DIAGNOSIS — I480 Paroxysmal atrial fibrillation: Secondary | ICD-10-CM

## 2022-08-23 DIAGNOSIS — J84112 Idiopathic pulmonary fibrosis: Secondary | ICD-10-CM

## 2022-08-23 DIAGNOSIS — J9621 Acute and chronic respiratory failure with hypoxia: Secondary | ICD-10-CM

## 2022-08-23 DIAGNOSIS — I5033 Acute on chronic diastolic (congestive) heart failure: Secondary | ICD-10-CM

## 2022-08-24 ENCOUNTER — Encounter (HOSPITAL_COMMUNITY): Payer: 59

## 2022-08-24 DIAGNOSIS — J9621 Acute and chronic respiratory failure with hypoxia: Secondary | ICD-10-CM

## 2022-08-24 DIAGNOSIS — I480 Paroxysmal atrial fibrillation: Secondary | ICD-10-CM

## 2022-08-24 DIAGNOSIS — I5033 Acute on chronic diastolic (congestive) heart failure: Secondary | ICD-10-CM

## 2022-08-24 DIAGNOSIS — J84112 Idiopathic pulmonary fibrosis: Secondary | ICD-10-CM

## 2022-08-24 LAB — COMPREHENSIVE METABOLIC PANEL
ALT: 33 U/L (ref 0–44)
AST: 71 U/L — ABNORMAL HIGH (ref 15–41)
Albumin: 2.9 g/dL — ABNORMAL LOW (ref 3.5–5.0)
Alkaline Phosphatase: 66 U/L (ref 38–126)
Anion gap: 9 (ref 5–15)
BUN: 31 mg/dL — ABNORMAL HIGH (ref 8–23)
CO2: 29 mmol/L (ref 22–32)
Calcium: 8.9 mg/dL (ref 8.9–10.3)
Chloride: 100 mmol/L (ref 98–111)
Creatinine, Ser: 1.26 mg/dL — ABNORMAL HIGH (ref 0.61–1.24)
GFR, Estimated: >60 mL/min (ref >60)
Glucose, Bld: 201 mg/dL — ABNORMAL HIGH (ref 70–99)
Potassium: 3.5 mmol/L (ref 3.5–5.1)
Sodium: 138 mmol/L (ref 135–145)
Total Bilirubin: 2.8 mg/dL — ABNORMAL HIGH (ref 0.3–1.2)
Total Protein: 6.7 g/dL (ref 6.5–8.1)

## 2022-08-24 LAB — MAGNESIUM: Magnesium: 2.3 mg/dL (ref 1.7–2.4)

## 2022-08-25 DIAGNOSIS — J9621 Acute and chronic respiratory failure with hypoxia: Secondary | ICD-10-CM

## 2022-08-25 DIAGNOSIS — J84112 Idiopathic pulmonary fibrosis: Secondary | ICD-10-CM

## 2022-08-25 DIAGNOSIS — I5033 Acute on chronic diastolic (congestive) heart failure: Secondary | ICD-10-CM

## 2022-08-25 DIAGNOSIS — I480 Paroxysmal atrial fibrillation: Secondary | ICD-10-CM

## 2022-08-25 NOTE — Progress Notes (Deleted)
HPI: FU CAD, CHF, H/O TAVR and atrial flutter/fibrillation; history of coronary artery disease, status post coronary artery bypass graft surgery performed in June 2006. Patient had a LIMA to the LAD, saphenous vein graft to the diagonal, saphenous vein graft to the acute marginal and saphenous vein graft to the PDA. Carotid Dopplers August 2015 showed no significant stenosis. Patient also with h/o atrial fibrillation/atrial flutter. Abdominal ultrasound June 2022 showed no aneurysm. 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 Cardiac catheterization January 2024 revealed moderate left main stenosis, occluded LAD, patent left circumflex and moderate stenosis in the right coronary artery.  PA pressure 57/9 and left ventricular end-diastolic pressure 14.  The LIMA to the LAD and saphenous vein graft to the first diagonal patent; saphenous vein graft to OM and saphenous vein graft to the PDA occluded.  Had TAVR March 16, 2022 with 26 mm Edwards ultra stented valve.   Note postprocedure course complicated by volume excess and hypoxemia. PYP scan April 2024 not suggestive of amyloidosis.  Echocardiogram May 2024 showed normal LV function, mild left ventricular hypertrophy, apical hypokinesis, moderate left atrial enlargement, status post aortic valve replacement with mean gradient 18 mmHg and no aortic insufficiency.  CTA July 2024 showed no pulmonary embolus, small bilateral pleural effusions, cirrhosis and splenomegaly.  Admitted with diastolic congestive heart failure July 2024 (increased dyspnea, lower extremity edema and weight gain).  Patient was treated with IV diuretics with improvement.  Since last seen   No current outpatient medications on file.   No current facility-administered medications for this visit.     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 Excell Seltzer and Dr. Leafy Ro   Sleep apnea    has a cpap - not using religiously    Past Surgical History:  Procedure Laterality Date   BIOPSY  11/18/2020   Procedure: BIOPSY;  Surgeon: Benancio Deeds, MD;  Location: [facility] ENDOSCOPY;  Service: Gastroenterology;;   CARDIOVERSION N/A 12/14/2017   Procedure: CARDIOVERSION;  Surgeon: Chilton Si, MD;  Location: [facility] ENDOSCOPY;  Service: Cardiovascular;  Laterality: N/A;   CARDIOVERSION N/A 10/01/2019   Procedure: CARDIOVERSION;  Surgeon: Jodelle Red, MD;  Location: [facility] ENDOSCOPY;  Service: Cardiovascular;  Laterality: N/A;   COLONOSCOPY     COLONOSCOPY WITH PROPOFOL N/A 11/18/2020   Procedure: COLONOSCOPY WITH PROPOFOL;  Surgeon: Benancio Deeds, MD;  Location: [facility] ENDOSCOPY;  Service: Gastroenterology;  Laterality: N/A;   CORONARY ARTERY BYPASS GRAFT  2006   ESOPHAGOGASTRODUODENOSCOPY (EGD) WITH PROPOFOL N/A 11/18/2020   Procedure: ESOPHAGOGASTRODUODENOSCOPY (EGD) WITH PROPOFOL;  Surgeon: Benancio Deeds, MD;  Location: [facility] ENDOSCOPY;  Service: Gastroenterology;  Laterality: N/A;   INTRAOPERATIVE TRANSTHORACIC ECHOCARDIOGRAM N/A  03/16/2022   Procedure: INTRAOPERATIVE TRANSTHORACIC ECHOCARDIOGRAM;  Surgeon: Tonny Bollman, MD;  Location: [facility] INVASIVE  CV LAB;  Service: Open Heart Surgery;  Laterality: N/A;   LEFT ATRIAL APPENDAGE OCCLUSION N/A 12/17/2021   Procedure: LEFT ATRIAL APPENDAGE OCCLUSION;  Surgeon: Tonny Bollman, MD;  Location: [facility] INVASIVE CV LAB;  Service: Cardiovascular;  Laterality: N/A;   POLYPECTOMY     POLYPECTOMY  11/18/2020   Procedure: POLYPECTOMY;  Surgeon: Benancio Deeds, MD;  Location: [facility] ENDOSCOPY;  Service: Gastroenterology;;   RIGHT/LEFT HEART CATH AND CORONARY/GRAFT ANGIOGRAPHY N/A 02/17/2022   Procedure: RIGHT/LEFT HEART CATH AND CORONARY/GRAFT ANGIOGRAPHY;  Surgeon: Tonny Bollman, MD;  Location: [facility] INVASIVE CV LAB;  Service: Cardiovascular;  Laterality: N/A;   TEE WITHOUT CARDIOVERSION N/A 12/17/2021   Procedure: TRANSESOPHAGEAL ECHOCARDIOGRAM (TEE);  Surgeon: Tonny Bollman, MD;  Location: [facility] INVASIVE CV LAB;  Service: Cardiovascular;  Laterality: N/A;   TRANSCATHETER AORTIC VALVE REPLACEMENT, TRANSFEMORAL Right 03/16/2022   Procedure: Transcatheter Aortic Valve Replacement, Transfemoral;  Surgeon: Tonny Bollman, MD;  Location: [facility] INVASIVE CV LAB;  Service: Open Heart Surgery;  Laterality: Right;    Social History   Socioeconomic History   Marital status: Significant Other    Spouse name: Rometta Emery   Number of children: 0   Years of education: Not on file   Highest education level: High school graduate  Occupational History   Occupation: Barrister's clerk  Tobacco Use   Smoking status: Never    Passive exposure: Never   Smokeless tobacco: Never  Vaping Use   Vaping status: Never Used  Substance and Sexual Activity   Alcohol use: Not Currently    Comment: occasional   Drug use: No   Sexual activity: Not on file  Other Topics Concern   Not on file  Social History Narrative   Not on file   Social Determinants of Health   Financial Resource Strain: Low Risk  (08/11/2022)   Overall Financial Resource Strain (CARDIA)    Difficulty of Paying Living Expenses: Not  hard at all  Food Insecurity: No Food Insecurity (08/18/2022)   Hunger Vital Sign    Worried About Running Out of Food in the Last Year: Never true    Ran Out of Food in the Last Year: Never true  Transportation Needs: No Transportation Needs (08/30/2022)   Received from [facility]   [facility] SDOH Transportation Source    Has lack of transportation kept you from medical appointments or from getting medications?: No    Has lack of transportation kept you from meetings, work, or from getting things needed for daily living?: No  Physical Activity: Not on file  Stress: No Stress Concern Present (08/04/2022)   Received from [facility]   [employer] of Occupational Health - Occupational Stress Questionnaire    Feeling of Stress : Not at all  Social Connections: Not on file  Intimate Partner Violence: Unknown (08/18/2022)   Received from [facility]   Domestic Abuse Assessment    Do you feel safe in your relationships at home?: Unable to assess    Physical Abuse: Denies    HRSN Domestic Abuse - Type of Abuse: Not on file    HRSN Domestic Abuse - Time Frame: Not on file    HRSN Domestic Abuse - Signs and Symptoms: Not on file    Verbal Abuse: Denies    HRSN Domestic Abuse - Reported To: Not on file    Family History  Problem Relation Age of Onset  Hypertension Mother    Hyperlipidemia Mother    Heart disease Father    Ulcers Father    Bipolar disorder Brother    Colon cancer Neg Hx    Colon polyps Neg Hx    Esophageal cancer Neg Hx    Rectal cancer Neg Hx    Stomach cancer Neg Hx     ROS: no fevers or chills, productive cough, hemoptysis, dysphasia, odynophagia, melena, hematochezia, dysuria, hematuria, rash, seizure activity, orthopnea, PND, pedal edema, claudication. Remaining systems are negative.  Physical Exam: Well-developed well-nourished in no acute distress.  Skin is warm and dry.  HEENT is normal.  Neck is supple.  Chest is clear to auscultation with normal  expansion.  Cardiovascular exam is regular rate and rhythm.  Abdominal exam nontender or distended. No masses palpated. Extremities show no edema. neuro grossly intact  ECG- personally reviewed  A/P  1 chronic diastolic congestive heart failure-  2 history of TAVR-continue SBE prophylaxis.  Recent echocardiogram showed normally functioning valve.  3 paroxysmal atrial fibrillation-patient is status post watchman and is not anticoagulated.  He remains in sinus rhythm.  4 disease status post coronary bypass and graft-he has not had chest pain.  Continue aspirin and statin.  5 pulmonary fibrosis-managed by pulmonary.  6 nonalcoholic cirrhosis with splenomegaly, portal venous hypertension, thrombocytopenia-managed by primary care.  Olga Millers, MD

## 2022-08-26 ENCOUNTER — Other Ambulatory Visit (HOSPITAL_COMMUNITY): Payer: Self-pay

## 2022-08-26 ENCOUNTER — Encounter (HOSPITAL_COMMUNITY): Payer: 59

## 2022-08-26 DIAGNOSIS — J9621 Acute and chronic respiratory failure with hypoxia: Secondary | ICD-10-CM

## 2022-08-26 DIAGNOSIS — I5033 Acute on chronic diastolic (congestive) heart failure: Secondary | ICD-10-CM

## 2022-08-26 DIAGNOSIS — J84112 Idiopathic pulmonary fibrosis: Secondary | ICD-10-CM

## 2022-08-26 DIAGNOSIS — I480 Paroxysmal atrial fibrillation: Secondary | ICD-10-CM

## 2022-08-26 LAB — CBC
HCT: 36.6 % — ABNORMAL LOW (ref 39.0–52.0)
Hemoglobin: 11.9 g/dL — ABNORMAL LOW (ref 13.0–17.0)
MCH: 31.2 pg (ref 26.0–34.0)
MCHC: 32.5 g/dL (ref 30.0–36.0)
MCV: 95.8 fL (ref 80.0–100.0)
Platelets: 77 10*3/uL — ABNORMAL LOW (ref 150–400)
RBC: 3.82 MIL/uL — ABNORMAL LOW (ref 4.22–5.81)
RDW: 17.2 % — ABNORMAL HIGH (ref 11.5–15.5)
WBC: 14.5 10*3/uL — ABNORMAL HIGH (ref 4.0–10.5)
nRBC: 0 % (ref 0.0–0.2)

## 2022-08-26 LAB — COMPREHENSIVE METABOLIC PANEL
ALT: 33 U/L (ref 0–44)
AST: 52 U/L — ABNORMAL HIGH (ref 15–41)
Albumin: 2.4 g/dL — ABNORMAL LOW (ref 3.5–5.0)
Alkaline Phosphatase: 52 U/L (ref 38–126)
Anion gap: 11 (ref 5–15)
BUN: 31 mg/dL — ABNORMAL HIGH (ref 8–23)
CO2: 27 mmol/L (ref 22–32)
Calcium: 8.7 mg/dL — ABNORMAL LOW (ref 8.9–10.3)
Chloride: 98 mmol/L (ref 98–111)
Creatinine, Ser: 1.29 mg/dL — ABNORMAL HIGH (ref 0.61–1.24)
GFR, Estimated: 59 mL/min — ABNORMAL LOW (ref >60)
Glucose, Bld: 131 mg/dL — ABNORMAL HIGH (ref 70–99)
Potassium: 3.2 mmol/L — ABNORMAL LOW (ref 3.5–5.1)
Sodium: 136 mmol/L (ref 135–145)
Total Bilirubin: 2.3 mg/dL — ABNORMAL HIGH (ref 0.3–1.2)
Total Protein: 5.8 g/dL — ABNORMAL LOW (ref 6.5–8.1)

## 2022-08-26 LAB — BRAIN NATRIURETIC PEPTIDE: B Natriuretic Peptide: 268.8 pg/mL — ABNORMAL HIGH (ref 0.0–100.0)

## 2022-08-27 DIAGNOSIS — I5033 Acute on chronic diastolic (congestive) heart failure: Secondary | ICD-10-CM

## 2022-08-27 DIAGNOSIS — J9621 Acute and chronic respiratory failure with hypoxia: Secondary | ICD-10-CM

## 2022-08-27 DIAGNOSIS — J84112 Idiopathic pulmonary fibrosis: Secondary | ICD-10-CM

## 2022-08-27 DIAGNOSIS — I480 Paroxysmal atrial fibrillation: Secondary | ICD-10-CM

## 2022-08-28 DIAGNOSIS — I480 Paroxysmal atrial fibrillation: Secondary | ICD-10-CM

## 2022-08-28 DIAGNOSIS — I5033 Acute on chronic diastolic (congestive) heart failure: Secondary | ICD-10-CM

## 2022-08-28 DIAGNOSIS — J9621 Acute and chronic respiratory failure with hypoxia: Secondary | ICD-10-CM

## 2022-08-28 DIAGNOSIS — J84112 Idiopathic pulmonary fibrosis: Secondary | ICD-10-CM

## 2022-08-29 DIAGNOSIS — J84112 Idiopathic pulmonary fibrosis: Secondary | ICD-10-CM

## 2022-08-29 DIAGNOSIS — I5033 Acute on chronic diastolic (congestive) heart failure: Secondary | ICD-10-CM

## 2022-08-29 DIAGNOSIS — I480 Paroxysmal atrial fibrillation: Secondary | ICD-10-CM

## 2022-08-29 DIAGNOSIS — J9621 Acute and chronic respiratory failure with hypoxia: Secondary | ICD-10-CM

## 2022-08-29 LAB — CBC
HCT: 35.9 % — ABNORMAL LOW (ref 39.0–52.0)
Hemoglobin: 12 g/dL — ABNORMAL LOW (ref 13.0–17.0)
MCH: 32.3 pg (ref 26.0–34.0)
MCHC: 33.4 g/dL (ref 30.0–36.0)
MCV: 96.8 fL (ref 80.0–100.0)
Platelets: 56 10*3/uL — ABNORMAL LOW (ref 150–400)
RBC: 3.71 MIL/uL — ABNORMAL LOW (ref 4.22–5.81)
RDW: 17.2 % — ABNORMAL HIGH (ref 11.5–15.5)
WBC: 10.2 10*3/uL (ref 4.0–10.5)
nRBC: 0 % (ref 0.0–0.2)

## 2022-08-29 LAB — BASIC METABOLIC PANEL WITH GFR
Anion gap: 10 (ref 5–15)
BUN: 33 mg/dL — ABNORMAL HIGH (ref 8–23)
CO2: 25 mmol/L (ref 22–32)
Calcium: 9.5 mg/dL (ref 8.9–10.3)
Chloride: 104 mmol/L (ref 98–111)
Creatinine, Ser: 1.44 mg/dL — ABNORMAL HIGH (ref 0.61–1.24)
GFR, Estimated: 52 mL/min — ABNORMAL LOW (ref >60)
Glucose, Bld: 203 mg/dL — ABNORMAL HIGH (ref 70–99)
Potassium: 4 mmol/L (ref 3.5–5.1)
Sodium: 139 mmol/L (ref 135–145)

## 2022-08-29 LAB — MAGNESIUM: Magnesium: 2.2 mg/dL (ref 1.7–2.4)

## 2022-08-31 ENCOUNTER — Encounter (HOSPITAL_COMMUNITY): Payer: Self-pay | Admitting: *Deleted

## 2022-08-31 ENCOUNTER — Encounter (HOSPITAL_COMMUNITY): Payer: 59

## 2022-08-31 ENCOUNTER — Telehealth (HOSPITAL_COMMUNITY): Payer: Self-pay | Admitting: Emergency Medicine

## 2022-08-31 DIAGNOSIS — J849 Interstitial pulmonary disease, unspecified: Secondary | ICD-10-CM

## 2022-08-31 NOTE — Progress Notes (Signed)
Pulmonary Individual Treatment Plan  Patient Details  Name: PEIGHTON SPERRAZZA MRN: [id number] Date of Birth: 14-Jun-1950 Referring Provider:   Flowsheet Row PULMONARY REHAB OTHER RESP ORIENTATION from 06/10/2022 in [facility] CARDIAC REHABILITATION  Referring Provider Dr. Isaiah Serge       Initial Encounter Date:  Flowsheet Row PULMONARY REHAB OTHER RESP ORIENTATION from 06/10/2022 in [facility] CARDIAC REHABILITATION  Date 06/10/22       Visit Diagnosis: ILD (interstitial lung disease) (HCC)  Patient's Home Medications on Admission:  No current outpatient medications on file.  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 Excell Seltzer and Dr. Leafy Ro   Sleep apnea    has a cpap - not using religiously    Tobacco Use: Social History   Tobacco Use  Smoking Status Never   Passive exposure: Never  Smokeless Tobacco Never    Labs: Review Flowsheet  More data exists      Latest Ref Rng & Units 02/12/2021 08/10/2021 02/17/2022 03/16/2022 07/16/2022  Labs for ITP Cardiac and Pulmonary Rehab  Cholestrol 0 - 200 mg/dL 147  - - - -  LDL (calc) 0 - 99 mg/dL 65  - - - -  HDL-C >82.95 mg/dL 62.13  - - - -  Trlycerides 0.0 - 149.0 mg/dL 08.6  - - - -  Hemoglobin A1c 4.0 - 5.6 % 5.9  5.8  - - -   PH, Arterial 7.35 - 7.45 - - 7.443  7.373  -  PCO2 arterial 32 - 48 mmHg - - 30.4  36.7  -  Bicarbonate 20.0 - 28.0 mmol/L - - 20.8  22.5  21.4  25.3   TCO2 22 - 32 mmol/L - - 22  23  19  22  20   -  Acid-base deficit 0.0 - 2.0 mmol/L - - 3.0  1.0  3.0  -  O2 Saturation % - - 94  79  89  94.2     Details       Multiple values from one day are sorted in reverse-chronological order         Capillary Blood Glucose: Lab Results  Component Value Date   GLUCAP 156 (H) 07/16/2022   GLUCAP 140 (H) 06/24/2022   GLUCAP 138 (H) 06/22/2022   GLUCAP 128 (H) 06/10/2022     Pulmonary Assessment Scores:  Pulmonary Assessment Scores     Row Name 06/10/22 0950         ADL UCSD   ADL Phase Entry     SOB Score total 61     Walk 2     Stairs 1     Bath 1     Dress 2     Shop 2       CAT  Score   CAT Score 25       mMRC Score   mMRC Score 3             UCSD: Self-administered rating of dyspnea associated with activities of daily living (ADLs) 6-point scale (0 = "not at all" to 5 = "maximal or unable to do because of breathlessness")  Scoring Scores range from 0 to 120.  Minimally important difference is 5 units  CAT: CAT can identify the health impairment of COPD patients and is better correlated with disease progression.  CAT has a scoring range of zero to 40. The CAT score is classified into four groups of low (less than 10), medium (10 - 20), high (21-30) and very high (31-40) based on the impact level of disease on health status. A CAT score over 10 suggests significant symptoms.  A worsening CAT score could be explained by an exacerbation, poor medication adherence, poor inhaler technique, or progression of COPD or comorbid conditions.  CAT MCID is 2 points  mMRC: mMRC (Modified Medical Research Council) Dyspnea Scale is used to assess the degree of baseline functional disability in patients of respiratory disease due to dyspnea. No minimal important difference is  established. A decrease in score of 1 point or greater is considered a positive change.   Pulmonary Function Assessment:   Exercise Target Goals: Exercise Program Goal: Individual exercise prescription set using results from initial 6 min walk test and THRR while considering  patient's activity barriers and safety.   Exercise Prescription Goal: Initial exercise prescription builds to 30-45 minutes a day of aerobic activity, 2-3 days per week.  Home exercise guidelines will be given to patient during program as part of exercise prescription that the participant will acknowledge.  Activity Barriers & Risk Stratification:  Activity Barriers & Cardiac Risk Stratification - 06/10/22 0855       Activity Barriers & Cardiac Risk Stratification   Activity Barriers Back Problems;Deconditioning;Shortness of Breath;Assistive Device;Balance Concerns    Cardiac Risk Stratification High             6 Minute Walk:  6 Minute Walk     Row Name 06/10/22 1023         6 Minute Walk   Phase Initial     Distance 450 feet     Walk Time 6 minutes     # of Rest Breaks 2     MPH 0.85     METS 0.86     RPE 13     Perceived Dyspnea  14     VO2 Peak 3.03     Symptoms Yes (comment)     Comments pt needed two breaks due to SOB and oxygen levels dropping (79-80)     Resting HR 63 bpm     Resting BP 132/42     Resting Oxygen Saturation  92 %     Exercise Oxygen Saturation  during 6 min walk 78 %     Max Ex. HR 80 bpm     Max Ex. BP 152/60     2 Minute Post BP 140/58       Interval HR   1 Minute HR 81     2 Minute HR 80     3 Minute HR 79     4 Minute HR 80     5 Minute HR 80     6 Minute HR 79     2 Minute Post HR 74  Interval Heart Rate? Yes       Interval Oxygen   Interval Oxygen? Yes     Baseline Oxygen Saturation % 92 %     1 Minute Oxygen Saturation % 88 %     1 Minute Liters of Oxygen 8 L     2 Minute Oxygen Saturation % 78 %     2 Minute Liters of Oxygen 8 L     3 Minute  Oxygen Saturation % 81 %     3 Minute Liters of Oxygen 8 L     4 Minute Oxygen Saturation % 88 %     4 Minute Liters of Oxygen 8 L     5 Minute Oxygen Saturation % 81 %     5 Minute Liters of Oxygen 8 L     6 Minute Oxygen Saturation % 79 %     6 Minute Liters of Oxygen 8 L     2 Minute Post Oxygen Saturation % 86 %     2 Minute Post Liters of Oxygen 8 L              Oxygen Initial Assessment:  Oxygen Initial Assessment - 06/10/22 1031       Home Oxygen   Home Oxygen Device Home Concentrator;E-Tanks    Sleep Oxygen Prescription Continuous    Liters per minute 4    Home Resting Oxygen Prescription Continuous    Liters per minute 4    Compliance with Home Oxygen Use Yes      Initial 6 min Walk   Oxygen Used Continuous    Liters per minute 8      Program Oxygen Prescription   Program Oxygen Prescription Continuous    Liters per minute 6    Comments pt will start at 6 Liters and if need go to 8 liters      Intervention   Short Term Goals To learn and exhibit compliance with exercise, home and travel O2 prescription;To learn and understand importance of monitoring SPO2 with pulse oximeter and demonstrate accurate use of the pulse oximeter.;To learn and understand importance of maintaining oxygen saturations>88%;To learn and demonstrate proper pursed lip breathing techniques or other breathing techniques.     Long  Term Goals Exhibits compliance with exercise, home  and travel O2 prescription;Maintenance of O2 saturations>88%;Verbalizes importance of monitoring SPO2 with pulse oximeter and return demonstration;Exhibits proper breathing techniques, such as pursed lip breathing or other method taught during program session             Oxygen Re-Evaluation:  Oxygen Re-Evaluation     Row Name 06/23/22 1435 07/20/22 1316           Program Oxygen Prescription   Program Oxygen Prescription Continuous Continuous      Liters per minute 6 6      Comments Pt did well on 6  liters --        Home Oxygen   Home Oxygen Device Home Concentrator;E-Tanks Home Concentrator;E-Tanks      Sleep Oxygen Prescription Continuous Continuous      Liters per minute 4 4      Home Resting Oxygen Prescription Continuous Continuous      Liters per minute 4 4      Compliance with Home Oxygen Use Yes Yes        Goals/Expected Outcomes   Short Term Goals To learn and exhibit compliance with exercise, home and travel O2 prescription;To learn and understand importance of monitoring  SPO2 with pulse oximeter and demonstrate accurate use of the pulse oximeter.;To learn and understand importance of maintaining oxygen saturations>88%;To learn and demonstrate proper pursed lip breathing techniques or other breathing techniques.  To learn and exhibit compliance with exercise, home and travel O2 prescription;To learn and understand importance of monitoring SPO2 with pulse oximeter and demonstrate accurate use of the pulse oximeter.;To learn and understand importance of maintaining oxygen saturations>88%;To learn and demonstrate proper pursed lip breathing techniques or other breathing techniques.       Long  Term Goals Exhibits compliance with exercise, home  and travel O2 prescription;Maintenance of O2 saturations>88%;Verbalizes importance of monitoring SPO2 with pulse oximeter and return demonstration;Exhibits proper breathing techniques, such as pursed lip breathing or other method taught during program session Exhibits compliance with exercise, home  and travel O2 prescription;Maintenance of O2 saturations>88%;Verbalizes importance of monitoring SPO2 with pulse oximeter and return demonstration;Exhibits proper breathing techniques, such as pursed lip breathing or other method taught during program session      Goals/Expected Outcomes compliance compliance               Oxygen Discharge (Final Oxygen Re-Evaluation):  Oxygen Re-Evaluation - 07/20/22 1316       Program Oxygen Prescription    Program Oxygen Prescription Continuous    Liters per minute 6      Home Oxygen   Home Oxygen Device Home Concentrator;E-Tanks    Sleep Oxygen Prescription Continuous    Liters per minute 4    Home Resting Oxygen Prescription Continuous    Liters per minute 4    Compliance with Home Oxygen Use Yes      Goals/Expected Outcomes   Short Term Goals To learn and exhibit compliance with exercise, home and travel O2 prescription;To learn and understand importance of monitoring SPO2 with pulse oximeter and demonstrate accurate use of the pulse oximeter.;To learn and understand importance of maintaining oxygen saturations>88%;To learn and demonstrate proper pursed lip breathing techniques or other breathing techniques.     Long  Term Goals Exhibits compliance with exercise, home  and travel O2 prescription;Maintenance of O2 saturations>88%;Verbalizes importance of monitoring SPO2 with pulse oximeter and return demonstration;Exhibits proper breathing techniques, such as pursed lip breathing or other method taught during program session    Goals/Expected Outcomes compliance             Initial Exercise Prescription:  Initial Exercise Prescription - 06/10/22 1000       Date of Initial Exercise RX and Referring Provider   Date 06/10/22    Referring Provider Dr. Isaiah Serge    Expected Discharge Date 09/09/22      Oxygen   Oxygen Continuous    Liters 6    Maintain Oxygen Saturation 88% or higher      NuStep   Level 1    SPM 60    Minutes 39      Prescription Details   Frequency (times per week) 2    Duration Progress to 30 minutes of continuous aerobic without signs/symptoms of physical distress      Intensity   THRR 40-80% of Max Heartrate 58-118    Ratings of Perceived Exertion 11-13    Perceived Dyspnea 0-4      Resistance Training   Training Prescription Yes    Weight 2    Reps 10-15             Perform Capillary Blood Glucose checks as needed.  Exercise Prescription  Changes:   Exercise Prescription Changes  Row Name 06/22/22 1500 07/06/22 1600 07/20/22 1500 07/29/22 1500       Response to Exercise   Blood Pressure (Admit) 130/52 124/52 120/60 120/58    Blood Pressure (Exercise) 140/60 124/66 140/58 140/60    Blood Pressure (Exit) 136/60 110/50 140/60 120/54    Heart Rate (Admit) 70 bpm 64 bpm 65 bpm 74 bpm    Heart Rate (Exercise) 75 bpm 69 bpm 68 bpm 70 bpm    Heart Rate (Exit) 77 bpm 66 bpm 63 bpm 67 bpm    Oxygen Saturation (Admit) 89 % 91 % 91 % 92 %    Oxygen Saturation (Exercise) 83 % 89 % 89 % 88 %    Oxygen Saturation (Exit) 89 % 90 % 93 % 94 %    Rating of Perceived Exertion (Exercise) 12 12 12 12     Perceived Dyspnea (Exercise) 13 12 12 12     Duration Continue with 30 min of aerobic exercise without signs/symptoms of physical distress. Continue with 30 min of aerobic exercise without signs/symptoms of physical distress. Continue with 30 min of aerobic exercise without signs/symptoms of physical distress. Continue with 30 min of aerobic exercise without signs/symptoms of physical distress.    Intensity THRR unchanged THRR unchanged THRR unchanged THRR unchanged      Progression   Progression Continue to progress workloads to maintain intensity without signs/symptoms of physical distress. Continue to progress workloads to maintain intensity without signs/symptoms of physical distress. Continue to progress workloads to maintain intensity without signs/symptoms of physical distress. Continue to progress workloads to maintain intensity without signs/symptoms of physical distress.      Resistance Training   Training Prescription Yes Yes Yes Yes    Weight 3 3 3 3     Reps 10-15 10-15 10-15 10-15    Time 10 Minutes 10 Minutes 10 Minutes 10 Minutes      Oxygen   Oxygen Continuous Continuous Continuous Continuous    Liters 6 6 6 10       NuStep   Level 1 1 1 1     SPM 83 86 85 87    Minutes 39 39 39 39    METs 1.9 1.9 1.9 1.8              Exercise Comments:   Exercise Goals and Review:   Exercise Goals     Row Name 06/10/22 1030 06/23/22 1432 07/20/22 1306 08/17/22 1530       Exercise Goals   Increase Physical Activity Yes Yes Yes Yes    Intervention Provide advice, education, support and counseling about physical activity/exercise needs.;Develop an individualized exercise prescription for aerobic and resistive training based on initial evaluation findings, risk stratification, comorbidities and participant's personal goals. Provide advice, education, support and counseling about physical activity/exercise needs.;Develop an individualized exercise prescription for aerobic and resistive training based on initial evaluation findings, risk stratification, comorbidities and participant's personal goals. Provide advice, education, support and counseling about physical activity/exercise needs.;Develop an individualized exercise prescription for aerobic and resistive training based on initial evaluation findings, risk stratification, comorbidities and participant's personal goals. Provide advice, education, support and counseling about physical activity/exercise needs.;Develop an individualized exercise prescription for aerobic and resistive training based on initial evaluation findings, risk stratification, comorbidities and participant's personal goals.    Expected Outcomes Long Term: Add in home exercise to make exercise part of routine and to increase amount of physical activity.;Short Term: Attend rehab on a regular basis to increase amount of physical activity.;Long Term: Exercising regularly  at least 3-5 days a week. Long Term: Add in home exercise to make exercise part of routine and to increase amount of physical activity.;Short Term: Attend rehab on a regular basis to increase amount of physical activity.;Long Term: Exercising regularly at least 3-5 days a week. Long Term: Add in home exercise to make exercise part of routine  and to increase amount of physical activity.;Short Term: Attend rehab on a regular basis to increase amount of physical activity.;Long Term: Exercising regularly at least 3-5 days a week. Long Term: Add in home exercise to make exercise part of routine and to increase amount of physical activity.;Short Term: Attend rehab on a regular basis to increase amount of physical activity.;Long Term: Exercising regularly at least 3-5 days a week.    Increase Strength and Stamina Yes Yes Yes Yes    Intervention Provide advice, education, support and counseling about physical activity/exercise needs.;Develop an individualized exercise prescription for aerobic and resistive training based on initial evaluation findings, risk stratification, comorbidities and participant's personal goals. Provide advice, education, support and counseling about physical activity/exercise needs.;Develop an individualized exercise prescription for aerobic and resistive training based on initial evaluation findings, risk stratification, comorbidities and participant's personal goals. Provide advice, education, support and counseling about physical activity/exercise needs.;Develop an individualized exercise prescription for aerobic and resistive training based on initial evaluation findings, risk stratification, comorbidities and participant's personal goals. Provide advice, education, support and counseling about physical activity/exercise needs.;Develop an individualized exercise prescription for aerobic and resistive training based on initial evaluation findings, risk stratification, comorbidities and participant's personal goals.    Expected Outcomes Short Term: Increase workloads from initial exercise prescription for resistance, speed, and METs.;Short Term: Perform resistance training exercises routinely during rehab and add in resistance training at home;Long Term: Improve cardiorespiratory fitness, muscular endurance and strength as  measured by increased METs and functional capacity ( ) Short Term: Increase workloads from initial exercise prescription for resistance, speed, and METs.;Short Term: Perform resistance training exercises routinely during rehab and add in resistance training at home;Long Term: Improve cardiorespiratory fitness, muscular endurance and strength as measured by increased METs and functional capacity ( ) Short Term: Increase workloads from initial exercise prescription for resistance, speed, and METs.;Short Term: Perform resistance training exercises routinely during rehab and add in resistance training at home;Long Term: Improve cardiorespiratory fitness, muscular endurance and strength as measured by increased METs and functional capacity ( ) Short Term: Increase workloads from initial exercise prescription for resistance, speed, and METs.;Short Term: Perform resistance training exercises routinely during rehab and add in resistance training at home;Long Term: Improve cardiorespiratory fitness, muscular endurance and strength as measured by increased METs and functional capacity ( )    Able to understand and use rate of perceived exertion (RPE) scale Yes Yes Yes Yes    Intervention Provide education and explanation on how to use RPE scale Provide education and explanation on how to use RPE scale Provide education and explanation on how to use RPE scale Provide education and explanation on how to use RPE scale    Expected Outcomes Short Term: Able to use RPE daily in rehab to express subjective intensity level;Long Term:  Able to use RPE to guide intensity level when exercising independently Short Term: Able to use RPE daily in rehab to express subjective intensity level;Long Term:  Able to use RPE to guide intensity level when exercising independently Short Term: Able to use RPE daily in rehab to express subjective intensity level;Long Term:  Able to use RPE to guide intensity level  when exercising  independently Short Term: Able to use RPE daily in rehab to express subjective intensity level;Long Term:  Able to use RPE to guide intensity level when exercising independently    Able to understand and use Dyspnea scale Yes Yes Yes Yes    Intervention Provide education and explanation on how to use Dyspnea scale Provide education and explanation on how to use Dyspnea scale Provide education and explanation on how to use Dyspnea scale Provide education and explanation on how to use Dyspnea scale    Expected Outcomes Short Term: Able to use Dyspnea scale daily in rehab to express subjective sense of shortness of breath during exertion;Long Term: Able to use Dyspnea scale to guide intensity level when exercising independently Short Term: Able to use Dyspnea scale daily in rehab to express subjective sense of shortness of breath during exertion;Long Term: Able to use Dyspnea scale to guide intensity level when exercising independently Short Term: Able to use Dyspnea scale daily in rehab to express subjective sense of shortness of breath during exertion;Long Term: Able to use Dyspnea scale to guide intensity level when exercising independently Short Term: Able to use Dyspnea scale daily in rehab to express subjective sense of shortness of breath during exertion;Long Term: Able to use Dyspnea scale to guide intensity level when exercising independently    Knowledge and understanding of Target Heart Rate Range (THRR) Yes Yes Yes Yes    Intervention Provide education and explanation of THRR including how the numbers were predicted and where they are located for reference Provide education and explanation of THRR including how the numbers were predicted and where they are located for reference Provide education and explanation of THRR including how the numbers were predicted and where they are located for reference Provide education and explanation of THRR including how the numbers were predicted and where they are  located for reference    Expected Outcomes Short Term: Able to state/look up THRR;Long Term: Able to use THRR to govern intensity when exercising independently;Short Term: Able to use daily as guideline for intensity in rehab Short Term: Able to state/look up THRR;Long Term: Able to use THRR to govern intensity when exercising independently;Short Term: Able to use daily as guideline for intensity in rehab Short Term: Able to state/look up THRR;Long Term: Able to use THRR to govern intensity when exercising independently;Short Term: Able to use daily as guideline for intensity in rehab Short Term: Able to state/look up THRR;Long Term: Able to use THRR to govern intensity when exercising independently;Short Term: Able to use daily as guideline for intensity in rehab    Understanding of Exercise Prescription Yes Yes Yes Yes    Intervention Provide education, explanation, and written materials on patient's individual exercise prescription Provide education, explanation, and written materials on patient's individual exercise prescription Provide education, explanation, and written materials on patient's individual exercise prescription Provide education, explanation, and written materials on patient's individual exercise prescription    Expected Outcomes Short Term: Able to explain program exercise prescription;Long Term: Able to explain home exercise prescription to exercise independently Short Term: Able to explain program exercise prescription;Long Term: Able to explain home exercise prescription to exercise independently Short Term: Able to explain program exercise prescription;Long Term: Able to explain home exercise prescription to exercise independently Short Term: Able to explain program exercise prescription;Long Term: Able to explain home exercise prescription to exercise independently             Exercise Goals Re-Evaluation :  Exercise Goals Re-Evaluation  Row Name 06/23/22 1432 07/20/22  1310 08/17/22 1530         Exercise Goal Re-Evaluation   Exercise Goals Review Increase Physical Activity;Increase Strength and Stamina;Able to understand and use rate of perceived exertion (RPE) scale;Able to understand and use Dyspnea scale;Knowledge and understanding of Target Heart Rate Range (THRR);Understanding of Exercise Prescription Increase Physical Activity;Increase Strength and Stamina;Able to understand and use rate of perceived exertion (RPE) scale;Able to understand and use Dyspnea scale;Knowledge and understanding of Target Heart Rate Range (THRR);Understanding of Exercise Prescription Increase Physical Activity;Increase Strength and Stamina;Able to understand and use rate of perceived exertion (RPE) scale;Able to understand and use Dyspnea scale;Knowledge and understanding of Target Heart Rate Range (THRR);Understanding of Exercise Prescription     Comments Pt has completed their first session of PR. He is eager to be in the program and ready to go. He is slightly deconditioned and will progress slowly. His SpO2 dropped (80%) during exercise at 4L so he was increase to 6L. He is currently exercising at 1.9 METs on the stepper. Will continue to monitor and progress as able. Pt has completed 5 sessions of pulmonary rehab. At first the patient seemd to be egar to be in the program but he was been a no show last week. during his last visit his SpO2 dropped (81%) and it has been taking longer for it to come back up to 90%. He is currently on 6 liters of oxygen while exercising. We messaged Dr. Isaiah Serge and was given orders to have the patient wear O2 mask and be up to 10 liters if needed. When the patient returns to rehab we will have him wear the mask and increase his O2 levels to see if this improves his exercise SpO2. He was currently exercising at 1.9 METs on the stepper. Will continue to monitor and progress as able. Pt has not been to pulmonary ehab since 6/27 due to being admitted to [facility] on  7/8. Will update once patient is discharged and called.     Expected Outcomes Through exercise at rehab and homw, the patient will meet their stated goals, Through exercise at rehab and home, the patient will achieve their goals. Through exercise at rehab and home, the patient will achieve their goals.              Discharge Exercise Prescription (Final Exercise Prescription Changes):  Exercise Prescription Changes - 07/29/22 1500       Response to Exercise   Blood Pressure (Admit) 120/58    Blood Pressure (Exercise) 140/60    Blood Pressure (Exit) 120/54    Heart Rate (Admit) 74 bpm    Heart Rate (Exercise) 70 bpm    Heart Rate (Exit) 67 bpm    Oxygen Saturation (Admit) 92 %    Oxygen Saturation (Exercise) 88 %    Oxygen Saturation (Exit) 94 %    Rating of Perceived Exertion (Exercise) 12    Perceived Dyspnea (Exercise) 12    Duration Continue with 30 min of aerobic exercise without signs/symptoms of physical distress.    Intensity THRR unchanged      Progression   Progression Continue to progress workloads to maintain intensity without signs/symptoms of physical distress.      Resistance Training   Training Prescription Yes    Weight 3    Reps 10-15    Time 10 Minutes      Oxygen   Oxygen Continuous    Liters 10      NuStep  Level 1    SPM 87    Minutes 39    METs 1.8             Nutrition:  Target Goals: Understanding of nutrition guidelines, daily intake of sodium 1500mg , cholesterol 200mg , calories 30% from fat and 7% or less from saturated fats, daily to have 5 or more servings of fruits and vegetables.  Biometrics:  Pre Biometrics - 06/10/22 1031       Pre Biometrics   Height 5\' 6"  (1.676 m)    Weight 209 lb 10.5 oz (95.1 kg)    Waist Circumference 41 inches    Hip Circumference 45 inches    Waist to Hip Ratio 0.91 %    BMI (Calculated) 33.86    Triceps Skinfold 10 mm    % Body Fat 29.1 %    Grip Strength 27.3 kg    Flexibility 0 in     Single Leg Stand 0 seconds              Nutrition Therapy Plan and Nutrition Goals:  Nutrition Therapy & Goals - 06/10/22 0952       Personal Nutrition Goals   Comments Patient scored 63 on his diet assessment. Handout explained and provided regarding healthier choices. We provide educational information on heart healthy nutrition with handouts and assistance with RD referral if patient is interested.      Intervention Plan   Intervention Nutrition handout(s) given to patient.    Expected Outcomes Short Term Goal: Understand basic principles of dietary content, such as calories, fat, sodium, cholesterol and nutrients.             Nutrition Assessments:  Nutrition Assessments - 06/10/22 0952       MEDFICTS Scores   Pre Score 63            MEDIFICTS Score Key: ?70 Need to make dietary changes  40-70 Heart Healthy Diet ? 40 Therapeutic Level Cholesterol Diet   Picture Your Plate Scores: <16 Unhealthy dietary pattern with much room for improvement. 41-50 Dietary pattern unlikely to meet recommendations for good health and room for improvement. 51-60 More healthful dietary pattern, with some room for improvement.  >60 Healthy dietary pattern, although there may be some specific behaviors that could be improved.    Nutrition Goals Re-Evaluation:   Nutrition Goals Discharge (Final Nutrition Goals Re-Evaluation):   Psychosocial: Target Goals: Acknowledge presence or absence of significant depression and/or stress, maximize coping skills, provide positive support system. Participant is able to verbalize types and ability to use techniques and skills needed for reducing stress and depression.  Initial Review & Psychosocial Screening:  Initial Psych Review & Screening - 06/10/22 0957       Initial Review   Current issues with None Identified      Family Dynamics   Good Support System? Yes      Barriers   Psychosocial barriers to participate in program There  are no identifiable barriers or psychosocial needs.      Screening Interventions   Interventions Encouraged to exercise;To provide support and resources with identified psychosocial needs;Provide feedback about the scores to participant    Expected Outcomes Short Term goal: Utilizing psychosocial counselor, staff and physician to assist with identification of specific Stressors or current issues interfering with healing process. Setting desired goal for each stressor or current issue identified.             Quality of Life Scores:  Quality of Life -  06/10/22 1155       Quality of Life   Select Quality of Life      Quality of Life Scores   Health/Function Pre 14.06 %    Socioeconomic Pre 22.19 %    Psych/Spiritual Pre 18.93 %    Family Pre 25 %    GLOBAL Pre 18.33 %            Scores of 19 and below usually indicate a poorer quality of life in these areas.  A difference of  2-3 points is a clinically meaningful difference.  A difference of 2-3 points in the total score of the Quality of Life Index has been associated with significant improvement in overall quality of life, self-image, physical symptoms, and general health in studies assessing change in quality of life.   PHQ-9: Review Flowsheet  More data exists      06/10/2022 02/12/2021 11/30/2019 11/29/2018 04/11/2018  Depression screen PHQ 2/9  Decreased Interest 0 0 0 0 0  Down, Depressed, Hopeless 0 0 0 0 0  PHQ - 2 Score 0 0 0 0 0  Altered sleeping 1 - 0 0 -  Tired, decreased energy 1 - 0 0 -  Change in appetite 1 - 0 0 -  Feeling bad or failure about yourself  0 - 0 0 -  Trouble concentrating 0 - 0 0 -  Moving slowly or fidgety/restless 0 - 0 0 -  Suicidal thoughts 0 - 0 0 -  PHQ-9 Score 3 - 0 0 -  Difficult doing work/chores Not difficult at all - Not difficult at all Not difficult at all -    Details           Interpretation of Total Score  Total Score Depression Severity:  1-4 = Minimal depression,  5-9 = Mild depression, 10-14 = Moderate depression, 15-19 = Moderately severe depression, 20-27 = Severe depression   Psychosocial Evaluation and Intervention:  Psychosocial Evaluation - 06/10/22 0958       Psychosocial Evaluation & Interventions   Interventions Stress management education;Relaxation education;Encouraged to exercise with the program and follow exercise prescription    Comments Patient has no psychosocial barriers or issues identified at his orientation visit. He is accompained by his wife today both very pleasant. 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 He says most of his work is remote. He looks at and approves building plans. He says he plans to retire in the next year but does enjoy his work. He says he has a good support system with his wife of many years and he has a lot of friends that support him. He loves to play golf when he can. 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 He says he is hopeful the program will help him get stronger; improve his walking and breathing and he can get back to his normal activities. He is ready to start the program.    Expected Outcomes Patient will continue to have no psychosocial barriers identified.    Continue Psychosocial Services  No Follow up required             Psychosocial Re-Evaluation:  Psychosocial Re-Evaluation     Row Name 06/14/22 0836 07/13/22 1124 08/09/22 0925         Psychosocial Re-Evaluation   Current issues  with None Identified None Identified None Identified     Comments Patient plans to start soon.  Patient referred to PR with Interstitial Lung Disease by Dr. Isaiah Serge.    Attended orientation on  06-10-22.  His personal goals for the program are to breath better and get back to his normal activites. Patient is new to the program  attending his 5th session.   Patient referred to PR with Interstitial Lung Disease by Dr. Isaiah Serge. His personal goals for the program are to breath better and get back to his normal activites.   His initial QOL score is 18.33% and his PHQ-9 is 3.  Patient seems to enjoy coming to the program and is interest in improving his health.  We will continue to monitor as he progresses in the program. Patient  has completed his 5th session. He seems to enjoy coming to the program and demonstartes and intesest in improving his health and continues to deomostarte a positive outlook and attitude.  Patient also enjoys  class and interactive with class and staff.   His personal goals for the program are to breath better and get back to his normal activites.  Patient seems to enjoy coming to the program and is interest in improving his health.  We will continue to monitor as he progresses in the program and works toward meeting these goals.     Expected Outcomes Patient will have psychosocial barriers identified at discharge. Patient will have no psychosocial barriers identified at discharge. Patient will have no psychosocial barriers identified at discharge.     Interventions Encouraged to attend Pulmonary Rehabilitation for the exercise;Relaxation education;Stress management education Encouraged to attend Pulmonary Rehabilitation for the exercise;Relaxation education;Stress management education Encouraged to attend Pulmonary Rehabilitation for the exercise;Relaxation education;Stress management education     Continue Psychosocial Services  No Follow up required No Follow up required No Follow up required              Psychosocial Discharge (Final Psychosocial Re-Evaluation):  Psychosocial Re-Evaluation - 08/09/22 0981       Psychosocial Re-Evaluation   Current issues with None Identified    Comments Patient  has completed his 5th session. He seems to enjoy coming to the program and demonstartes and intesest in  improving his health and continues to deomostarte a positive outlook and attitude.  Patient also enjoys  class and interactive with class and staff.   His personal goals for the program are to breath better and get back to his normal activites.  Patient seems to enjoy coming to the program and is interest in improving his health.  We will continue to monitor as he progresses in the program and works toward meeting these goals.    Expected Outcomes Patient will have no psychosocial barriers identified at discharge.    Interventions Encouraged to attend Pulmonary Rehabilitation for the exercise;Relaxation education;Stress management education    Continue Psychosocial Services  No Follow up required              Education: Education Goals: Education classes will be provided on a weekly basis, covering required topics. Participant will state understanding/return demonstration of topics presented.  Learning Barriers/Preferences:  Learning Barriers/Preferences - 06/10/22 0953       Learning Barriers/Preferences   Learning Barriers None    Learning Preferences Audio             Education Topics: How Lungs Work and Diseases: - Discuss the anatomy of the lungs and diseases that can affect  the lungs, such as COPD.   Exercise: -Discuss the importance of exercise, FITT principles of exercise, normal and abnormal responses to exercise, and how to exercise safely.   Environmental Irritants: -Discuss types of environmental irritants and how to limit exposure to environmental irritants.   Meds/Inhalers and oxygen: - Discuss respiratory medications, definition of an inhaler and oxygen, and the proper way to use an inhaler and oxygen.   Energy Saving Techniques: - Discuss methods to conserve energy and decrease shortness of breath when performing activities of daily living.    Bronchial Hygiene / Breathing Techniques: - Discuss breathing mechanics, pursed-lip breathing technique,   proper posture, effective ways to clear airways, and other functional breathing techniques   Cleaning Equipment: - Provides group verbal and written instruction about the health risks of elevated stress, cause of high stress, and healthy ways to reduce stress.   Nutrition I: Fats: - Discuss the types of cholesterol, what cholesterol does to the body, and how cholesterol levels can be controlled.   Nutrition II: Labels: -Discuss the different components of food labels and how to read food labels. Flowsheet Row PULMONARY REHAB OTHER RESPIRATORY from 06/24/2022 in [facility]  Date 06/24/22  Educator Handout  Instruction Review Code 1- Verbalizes Understanding       Respiratory Infections: - Discuss the signs and symptoms of respiratory infections, ways to prevent respiratory infections, and the importance of seeking medical treatment when having a respiratory infection. Flowsheet Row PULMONARY REHAB OTHER RESPIRATORY from 07/29/2022 in [place] [facility]  Date 07/08/22  Educator handout       Stress I: Signs and Symptoms: - Discuss the causes of stress, how stress may lead to anxiety and depression, and ways to limit stress.   Stress II: Relaxation: -Discuss relaxation techniques to limit stress.   Oxygen for Home/Travel: - Discuss how to prepare for travel when on oxygen and proper ways to transport and store oxygen to ensure safety. Flowsheet Row PULMONARY REHAB OTHER RESPIRATORY from 07/29/2022 in [facility]  Date 07/29/22  Educator handout       Knowledge Questionnaire Score:  Knowledge Questionnaire Score - 06/10/22 0953       Knowledge Questionnaire Score   Pre Score 15/18             Core Components/Risk Factors/Patient Goals at Admission:  Personal Goals and Risk Factors at Admission - 06/10/22 0954       Core Components/Risk Factors/Patient Goals on Admission    Weight Management Obesity     Improve shortness of breath with ADL's Yes    Intervention Provide education, individualized exercise plan and daily activity instruction to help decrease symptoms of SOB with activities of daily living.    Expected Outcomes Short Term: Improve cardiorespiratory fitness to achieve a reduction of symptoms when performing ADLs;Long Term: Be able to perform more ADLs without symptoms or delay the onset of symptoms    Increase knowledge of respiratory medications and ability to use respiratory devices properly  Yes    Intervention Provide education and demonstration as needed of appropriate use of medications, inhalers, and oxygen therapy.    Expected Outcomes Short Term: Achieves understanding of medications use. Understands that oxygen is a medication prescribed by physician. Demonstrates appropriate use of inhaler and oxygen therapy.;Long Term: Maintain appropriate use of medications, inhalers, and oxygen therapy.    Heart Failure Yes    Intervention Provide a combined exercise and nutrition program that is supplemented with education, support  and counseling about heart failure. Directed toward relieving symptoms such as shortness of breath, decreased exercise tolerance, and extremity edema.    Expected Outcomes Short term: Attendance in program 2-3 days a week with increased exercise capacity. Reported lower sodium intake. Reported increased fruit and vegetable intake. Reports medication compliance.    Hypertension Yes    Intervention Provide education on lifestyle modifcations including regular physical activity/exercise, weight management, moderate sodium restriction and increased consumption of fresh fruit, vegetables, and low fat dairy, alcohol moderation, and smoking cessation.;Monitor prescription use compliance.    Expected Outcomes Short Term: Continued assessment and intervention until BP is < 140/39mm HG in hypertensive participants. < 130/25mm HG in hypertensive participants with diabetes,  heart failure or chronic kidney disease.;Long Term: Maintenance of blood pressure at goal levels.    Personal Goal Other Yes    Personal Goal Patient wants to breathe better and get back to his normal activities.    Intervention Patient will attend PR 2 days/week with exercise and education.    Expected Outcomes Patient will complete the program meeting both personal and program goals.             Core Components/Risk Factors/Patient Goals Review:   Goals and Risk Factor Review     Row Name 06/14/22 0844 07/13/22 1132 08/09/22 0935         Core Components/Risk Factors/Patient Goals Review   Personal Goals Review Improve shortness of breath with ADL's;Develop more efficient breathing techniques such as purse lipped breathing and diaphragmatic breathing and practicing self-pacing with activity.;Other Improve shortness of breath with ADL's;Develop more efficient breathing techniques such as purse lipped breathing and diaphragmatic breathing and practicing self-pacing with activity.;Other --     Review Patient plans to start soon.  Patient referred to PR with Interstitial Lung Disease by Dr. Isaiah Serge. His personal  goals for the program are to breath better and get back to his normal activities.  We will encourage his progress as he meets these goals. Patient is new to PR program, attending his 5th session.   Patient referred to PR with Interstitial Lung Disease by Dr. Isaiah Serge.  His personal  goals for the program are to breath better and get back to his normal activities.  He exercises with oxygen at 6L via nasal cannula.  Tolerates exercise on the nu-step at level 1.   We continue to encourage  pursed lipped breathing and practice self pacing with increased activity.   Pre-exercise O2 sats  are 90% and exercise O2 sats are between 81%-89% .  Plans to change oxygen to mask while exercising.  We will continue to monitor his progress as he works toward meeting these goals. Patient has completed his 5th  session.   His personal  goals for the program are to breath better and get back to his normal activities.  He exercises with oxygen at 10L via face mask.  Tolerates exercise on the nu-step at level 1.   We continue to work with and encourage him, but he is very deconditioned  We will help him develop a efficient breathing techniques such as,  pursed lipped breathing and practice self pacing with increased activity.   Pre-exercise O2 sats  are 92% and exercise O2 sats are between 83%-89% .  He is doing well in program and consistent attendance.   We will continue to monitor his progress as he works toward meeting these goals.     Expected Outcomes Patient will complete the program meeting both program  and peresonal goals. Patient will complete the program meeting both program and peresonal goals. Patient will complete the program meeting both program and peresonal goals.              Core Components/Risk Factors/Patient Goals at Discharge (Final Review):   Goals and Risk Factor Review - 08/09/22 0935       Core Components/Risk Factors/Patient Goals Review   Review Patient has completed his 5th session.   His personal  goals for the program are to breath better and get back to his normal activities.  He exercises with oxygen at 10L via face mask.  Tolerates exercise on the nu-step at level 1.   We continue to work with and encourage him, but he is very deconditioned  We will help him develop a efficient breathing techniques such as,  pursed lipped breathing and practice self pacing with increased activity.   Pre-exercise O2 sats  are 92% and exercise O2 sats are between 83%-89% .  He is doing well in program and consistent attendance.   We will continue to monitor his progress as he works toward meeting these goals.    Expected Outcomes Patient will complete the program meeting both program and peresonal goals.             ITP Comments:  ITP Comments     Row Name 07/08/22 1557 08/17/22 1541  08/18/22 1610 08/31/22 1024     ITP Comments Notified Dr. Isaiah Serge of the pt's desaturation during exercise.  Today was the pt's 5th visit, and during exercise his 02 sats were 83% on 6L 02 via [place].  The pt initially came to the program on 4L 02 via [place] and had to be bumped up to 6L on his first day.  During the halfway point of exercise, his 02 sats have been ranging from 81-86% and he has to stop exercising and deep breathe for his 02 sats to get up to 88%.  Today during exercise he had to stop for several minutes to get his 02 sats up to 89%.  I increased his oxygen to 8L via [place] and during the next check his 02 sats were 86%.  Per Dr. Isaiah Serge, we can place the pt on a mask during exercise and increase his oxygen up to 10 L.  The pt verbalized understanding of changing to a mask next week and that we might have to increase his oxygen to 10 L. Spoke with patient and his wife.  Pt reports possible discharge home from [facility] on Thursday or Friday.  Instructed to bring return to Rehab class from PCP. 30 day review completed. ITP sent to Dr.Jehanzeb Memon, Medical Director of  Pulmonary Rehab. Continue with ITP unless changes are made by physician.  Pt is currently in hospital for pnuemonia and heart failure. Last attended rehab on 07/29/22. Rosanne Ashing is currently in teh hospital.  He has been admitted since 08/09/22. Prognosis has not been great and are now looking at comfort care.  He would need prior to returning to rehab.  We will discharge him at this time. Discharge ITP created and sent to Dr. Erick Blinks, Medical Director to review and sign.  He completed 7 sessions.             Comments: Discharge ITP

## 2022-08-31 NOTE — Progress Notes (Signed)
Discharge Summary: [name] [birth year]   J[place]ota discharged today from  rehab with 7 sessions completed.  Details of the patient's exercise prescription and what he  needs to do in order to continue the prescription and progress were discussed with patient.    6 Minute Walk     Row Name 06/10/22 1023         6 Minute Walk   Phase Initial     Distance 450 feet     Walk Time 6 minutes     # of Rest Breaks 2     MPH 0.85     METS 0.86     RPE 13     Perceived Dyspnea  14     VO2 Peak 3.03     Symptoms Yes (comment)     Comments pt needed two breaks due to SOB and oxygen levels dropping (79-80)     Resting HR 63 bpm     Resting BP 132/42     Resting Oxygen Saturation  92 %     Exercise Oxygen Saturation  during 6 min walk 78 %     Max Ex. HR 80 bpm     Max Ex. BP 152/60     2 Minute Post BP 140/58       Interval HR   1 Minute HR 81     2 Minute HR 80     3 Minute HR 79     4 Minute HR 80     5 Minute HR 80     6 Minute HR 79     2 Minute Post HR 74     Interval Heart Rate? Yes       Interval Oxygen   Interval Oxygen? Yes     Baseline Oxygen Saturation % 92 %     1 Minute Oxygen Saturation % 88 %     1 Minute Liters of Oxygen 8 L     2 Minute Oxygen Saturation % 78 %     2 Minute Liters of Oxygen 8 L     3 Minute Oxygen Saturation % 81 %     3 Minute Liters of Oxygen 8 L     4 Minute Oxygen Saturation % 88 %     4 Minute Liters of Oxygen 8 L     5 Minute Oxygen Saturation % 81 %     5 Minute Liters of Oxygen 8 L     6 Minute Oxygen Saturation % 79 %     6 Minute Liters of Oxygen 8 L     2 Minute Post Oxygen Saturation % 86 %     2 Minute Post Liters of Oxygen 8 L

## 2022-09-02 ENCOUNTER — Encounter (HOSPITAL_COMMUNITY): Payer: 59

## 2022-09-02 DEATH — deceased

## 2022-09-07 ENCOUNTER — Encounter (HOSPITAL_COMMUNITY): Payer: 59

## 2022-09-07 ENCOUNTER — Ambulatory Visit: Payer: 59 | Admitting: Cardiology

## 2022-09-09 ENCOUNTER — Encounter (HOSPITAL_COMMUNITY): Payer: 59

## 2022-09-15 ENCOUNTER — Ambulatory Visit: Payer: 59 | Admitting: Adult Health

## 2022-10-03 DEATH — deceased

## 2022-10-05 ENCOUNTER — Telehealth: Payer: Self-pay

## 2022-10-05 NOTE — Telephone Encounter (Signed)
error 

## 2022-10-19 ENCOUNTER — Ambulatory Visit: Payer: 59 | Admitting: Neurology

## 2023-03-23 ENCOUNTER — Ambulatory Visit: Payer: 59

## 2023-03-23 ENCOUNTER — Other Ambulatory Visit (HOSPITAL_COMMUNITY): Payer: 59
